# Patient Record
Sex: Male | Born: 1955
Health system: Southern US, Community
[De-identification: ages and names within clinical notes are randomized; demographics above are authoritative.]

## PROBLEM LIST (undated history)

## (undated) DIAGNOSIS — I10 Essential (primary) hypertension: Secondary | ICD-10-CM

## (undated) DIAGNOSIS — G4733 Obstructive sleep apnea (adult) (pediatric): Secondary | ICD-10-CM

## (undated) DIAGNOSIS — Z954 Presence of other heart-valve replacement: Secondary | ICD-10-CM

## (undated) DIAGNOSIS — E785 Hyperlipidemia, unspecified: Secondary | ICD-10-CM

## (undated) DIAGNOSIS — K5732 Diverticulitis of large intestine without perforation or abscess without bleeding: Secondary | ICD-10-CM

## (undated) DIAGNOSIS — Z8679 Personal history of other diseases of the circulatory system: Secondary | ICD-10-CM

## (undated) DIAGNOSIS — E119 Type 2 diabetes mellitus without complications: Secondary | ICD-10-CM

## (undated) DIAGNOSIS — H9192 Unspecified hearing loss, left ear: Secondary | ICD-10-CM

## (undated) DIAGNOSIS — Z9989 Dependence on other enabling machines and devices: Secondary | ICD-10-CM

## (undated) DIAGNOSIS — Z9889 Other specified postprocedural states: Secondary | ICD-10-CM

## (undated) DIAGNOSIS — C641 Malignant neoplasm of right kidney, except renal pelvis: Secondary | ICD-10-CM

## (undated) DIAGNOSIS — I4819 Other persistent atrial fibrillation: Secondary | ICD-10-CM

## (undated) DIAGNOSIS — N182 Chronic kidney disease, stage 2 (mild): Secondary | ICD-10-CM

## (undated) DIAGNOSIS — I099 Rheumatic heart disease, unspecified: Secondary | ICD-10-CM

## (undated) DIAGNOSIS — I639 Cerebral infarction, unspecified: Secondary | ICD-10-CM

## (undated) DIAGNOSIS — E291 Testicular hypofunction: Secondary | ICD-10-CM

## (undated) DIAGNOSIS — F419 Anxiety disorder, unspecified: Secondary | ICD-10-CM

## (undated) DIAGNOSIS — F32A Depression, unspecified: Secondary | ICD-10-CM

## (undated) DIAGNOSIS — R7303 Prediabetes: Secondary | ICD-10-CM

## (undated) DIAGNOSIS — I050 Rheumatic mitral stenosis: Secondary | ICD-10-CM

## (undated) DIAGNOSIS — E669 Obesity, unspecified: Secondary | ICD-10-CM

## (undated) DIAGNOSIS — R911 Solitary pulmonary nodule: Secondary | ICD-10-CM

## (undated) DIAGNOSIS — E039 Hypothyroidism, unspecified: Secondary | ICD-10-CM

## (undated) DIAGNOSIS — K469 Unspecified abdominal hernia without obstruction or gangrene: Secondary | ICD-10-CM

## (undated) DIAGNOSIS — N2889 Other specified disorders of kidney and ureter: Secondary | ICD-10-CM

## (undated) DIAGNOSIS — I2510 Atherosclerotic heart disease of native coronary artery without angina pectoris: Secondary | ICD-10-CM

## (undated) DIAGNOSIS — I442 Atrioventricular block, complete: Secondary | ICD-10-CM

## (undated) DIAGNOSIS — F329 Major depressive disorder, single episode, unspecified: Secondary | ICD-10-CM

## (undated) HISTORY — DX: Solitary pulmonary nodule: R91.1

## (undated) HISTORY — PX: INGUINAL HERNIA REPAIR: SUR1180

## (undated) HISTORY — DX: Testicular hypofunction: E29.1

## (undated) HISTORY — DX: Unspecified abdominal hernia without obstruction or gangrene: K46.9

## (undated) HISTORY — DX: Hyperlipidemia, unspecified: E78.5

## (undated) HISTORY — DX: Malignant neoplasm of right kidney, except renal pelvis: C64.1

## (undated) HISTORY — DX: Diverticulitis of large intestine without perforation or abscess without bleeding: K57.32

## (undated) HISTORY — DX: Type 2 diabetes mellitus without complications: E11.9

## (undated) HISTORY — DX: Obesity, unspecified: E66.9

## (undated) HISTORY — DX: Atrioventricular block, complete: I44.2

## (undated) HISTORY — DX: Essential (primary) hypertension: I10

---

## 1988-03-23 HISTORY — PX: EYE SURGERY: SHX253

## 1996-03-23 DIAGNOSIS — I639 Cerebral infarction, unspecified: Secondary | ICD-10-CM

## 1996-03-23 HISTORY — DX: Cerebral infarction, unspecified: I63.9

## 2002-01-10 ENCOUNTER — Emergency Department (HOSPITAL_COMMUNITY): Admission: EM | Admit: 2002-01-10 | Discharge: 2002-01-11 | Payer: Self-pay

## 2012-12-19 ENCOUNTER — Encounter: Payer: Self-pay | Admitting: Cardiology

## 2012-12-19 ENCOUNTER — Encounter: Payer: Self-pay | Admitting: *Deleted

## 2012-12-19 DIAGNOSIS — D72829 Elevated white blood cell count, unspecified: Secondary | ICD-10-CM | POA: Insufficient documentation

## 2012-12-19 DIAGNOSIS — K469 Unspecified abdominal hernia without obstruction or gangrene: Secondary | ICD-10-CM | POA: Insufficient documentation

## 2012-12-19 DIAGNOSIS — K5732 Diverticulitis of large intestine without perforation or abscess without bleeding: Secondary | ICD-10-CM | POA: Insufficient documentation

## 2012-12-19 DIAGNOSIS — E559 Vitamin D deficiency, unspecified: Secondary | ICD-10-CM | POA: Insufficient documentation

## 2012-12-19 DIAGNOSIS — E782 Mixed hyperlipidemia: Secondary | ICD-10-CM | POA: Insufficient documentation

## 2012-12-19 DIAGNOSIS — J310 Chronic rhinitis: Secondary | ICD-10-CM | POA: Insufficient documentation

## 2012-12-19 DIAGNOSIS — E291 Testicular hypofunction: Secondary | ICD-10-CM | POA: Insufficient documentation

## 2012-12-19 DIAGNOSIS — E669 Obesity, unspecified: Secondary | ICD-10-CM | POA: Insufficient documentation

## 2012-12-19 DIAGNOSIS — IMO0001 Reserved for inherently not codable concepts without codable children: Secondary | ICD-10-CM | POA: Insufficient documentation

## 2012-12-19 DIAGNOSIS — R7309 Other abnormal glucose: Secondary | ICD-10-CM | POA: Insufficient documentation

## 2012-12-19 DIAGNOSIS — D126 Benign neoplasm of colon, unspecified: Secondary | ICD-10-CM | POA: Insufficient documentation

## 2012-12-19 DIAGNOSIS — G4730 Sleep apnea, unspecified: Secondary | ICD-10-CM | POA: Insufficient documentation

## 2012-12-19 DIAGNOSIS — I10 Essential (primary) hypertension: Secondary | ICD-10-CM | POA: Insufficient documentation

## 2012-12-20 ENCOUNTER — Ambulatory Visit (INDEPENDENT_AMBULATORY_CARE_PROVIDER_SITE_OTHER): Payer: Managed Care, Other (non HMO) | Admitting: Cardiology

## 2012-12-20 ENCOUNTER — Encounter: Payer: Self-pay | Admitting: Cardiology

## 2012-12-20 VITALS — BP 121/75 | HR 60 | Ht 68.0 in | Wt 199.0 lb

## 2012-12-20 DIAGNOSIS — I10 Essential (primary) hypertension: Secondary | ICD-10-CM

## 2012-12-20 NOTE — Progress Notes (Signed)
Patient ID: Kevin English, male   DOB: September 08, 1955, 57 y.o.   MRN: [id number]    Patient Name: Kevin English Date of Encounter: 12/20/2012  Primary Care Provider:  No primary provider on file. Primary Cardiologist:  Tobias Alexander, H   Patient Profile Fatigue, SOB  Problem List   Past Medical History  Diagnosis Date  . Other abnormal glucose   . Benign neoplasm of colon   . Hernia   . Chronic rhinitis   . Diverticulitis of colon   . Hyperlipidemia   . HTN (hypertension)   . Hypogonadism male   . Hyperlipemia   . Leukocytosis   . Myalgia and myositis, unspecified   . Obesity   . Sleep apnea   . Vitamin D deficiency    No past surgical history on file.  Allergies  No Known Allergies  HPI  57 years old male with h/o HTN, hyperlipidemia, former smoker, who is complaining of fatigue and SOB. He has had a cardiac work-up including exercise nuclear stress test that was negative.  He was sent here by his PCP for concern of fatigue. He was recently diagnosed with hypothyroidism and just started on Synthroid. The patient denies any prior chest pain. He states that he never was involved in any sports or regular physical activity. His lifestyle is very sedentary. The patient comments that although he feel tired there has been no change in the last 3 years. He denies SOB on regular exertion.  Home Medications  Prior to Admission medications   Medication Sig Start Date End Date Taking? Authorizing Provider  amLODipine (NORVASC) 10 MG tablet Take 10 mg by mouth daily.   Yes Historical Provider, MD  aspirin 325 MG tablet Take 325 mg by mouth daily.   Yes Historical Provider, MD  citalopram (CELEXA) 20 MG tablet Take 20 mg by mouth daily.   Yes Historical Provider, MD  cyclobenzaprine (FLEXERIL) 10 MG tablet Take 10 mg by mouth 3 (three) times daily as needed for muscle spasms.   Yes Historical Provider, MD  diazepam (VALIUM) 5 MG tablet Take 5 mg by mouth every 6 (six) hours as  needed for anxiety.   Yes Historical Provider, MD  Docusate Calcium (STOOL SOFTENER PO) Take by mouth as needed.   Yes Historical Provider, MD  eszopiclone (LUNESTA) 1 MG TABS tablet Take 1 mg by mouth at bedtime. 11/30/12  Yes Historical Provider, MD  levothyroxine (SYNTHROID, LEVOTHROID) 50 MCG tablet 50 mcg daily. 12/02/12  Yes Historical Provider, MD  losartan-hydrochlorothiazide (HYZAAR) 100-12.5 MG per tablet Take 1 tablet by mouth daily.   Yes Historical Provider, MD  metoprolol succinate (TOPROL-XL) 25 MG 24 hr tablet Take 25 mg by mouth daily. 10/21/12  Yes Historical Provider, MD  pravastatin (PRAVACHOL) 40 MG tablet Take 40 mg by mouth daily. 09/18/12  Yes Historical Provider, MD   Family History  Family History  Problem Relation Age of Onset  . Hypertension      Social History  History   Social History  . Marital Status: Married    Spouse Name: N/A    Number of Children: N/A  . Years of Education: N/A   Occupational History  . Not on file.   Social History Main Topics  . Smoking status: Light Tobacco Smoker    Types: Cigarettes  . Smokeless tobacco: Not on file  . Alcohol Use: Not on file  . Drug Use: Not on file  . Sexual Activity: Not on file   Other  Topics Concern  . Not on file   Social History Narrative  . No narrative on file     Review of Systems General:  No chills, fever, night sweats or weight changes.  Cardiovascular:  No chest pain, dyspnea on exertion, edema, orthopnea, palpitations, paroxysmal nocturnal dyspnea. Dermatological: No rash, lesions/masses Respiratory: No cough, dyspnea Urologic: No hematuria, dysuria Abdominal:   No nausea, vomiting, diarrhea, bright red blood per rectum, melena, or hematemesis Neurologic:  No visual changes, wkns, changes in mental status. All other systems reviewed and are otherwise negative except as noted above.  Physical Exam  Blood pressure 121/75, pulse 60, height 5\' 8"  (1.727 m), weight 199 lb (90.266  kg).  General: Pleasant, NAD Psych: Normal affect. Neuro: Alert and oriented X 3. Moves all extremities spontaneously. HEENT: Normal  Neck: Supple without bruits or JVD. Lungs:  Resp regular and unlabored, CTA. Heart: RRR no s3, s4, or murmurs. Abdomen: Soft, non-tender, non-distended, BS + x 4.  Extremities: No clubbing, cyanosis or edema. DP/PT/Radials 2+ and equal bilaterally.  Accessory Clinical Findings  ECG - SR, 58 BPM, RBBB    Assessment & Plan  57 year old male   1. Fatigue - most probably multifactorial, hypothyroidism, that was just started on replacement with Synthroid, deconditioning and possible mild depression. (the patient states that he has no desire or motivation to do any change in his life even if he realizes that he has to). He was encouraged  To exercise regularly.  2. Hypertension - controlled  3. Hyperlipidemia - on Pravastatin 40 mg PO QHS, recently checked by his PCP  4. Smoking cessation - counseling provided    Lars Masson, MD 12/20/2012, 3:46 PM

## 2012-12-20 NOTE — Patient Instructions (Addendum)
**Note De-identified [place] Obfuscation** Your physician recommends that you continue on your current medications as directed. Please refer to the Current Medication list given to you today.  Your physician wants you to follow-up in: 1 year. You will receive a reminder letter in the mail two months in advance. If you don't receive a letter, please call our office to schedule the follow-up appointment.  

## 2013-07-20 ENCOUNTER — Encounter: Payer: Self-pay | Admitting: Cardiology

## 2013-07-20 ENCOUNTER — Ambulatory Visit (HOSPITAL_COMMUNITY): Payer: Managed Care, Other (non HMO) | Attending: Cardiovascular Disease | Admitting: Radiology

## 2013-07-20 ENCOUNTER — Ambulatory Visit (INDEPENDENT_AMBULATORY_CARE_PROVIDER_SITE_OTHER): Payer: Managed Care, Other (non HMO) | Admitting: Cardiology

## 2013-07-20 VITALS — BP 128/86 | HR 80 | Ht 68.0 in | Wt 201.0 lb

## 2013-07-20 DIAGNOSIS — R000 Tachycardia, unspecified: Secondary | ICD-10-CM | POA: Insufficient documentation

## 2013-07-20 DIAGNOSIS — R0602 Shortness of breath: Secondary | ICD-10-CM

## 2013-07-20 DIAGNOSIS — R55 Syncope and collapse: Secondary | ICD-10-CM

## 2013-07-20 LAB — COMPREHENSIVE METABOLIC PANEL
ALT: 28 U/L (ref 0–53)
AST: 19 U/L (ref 0–37)
Albumin: 3.9 g/dL (ref 3.5–5.2)
Alkaline Phosphatase: 90 U/L (ref 39–117)
BUN: 14 mg/dL (ref 6–23)
CO2: 29 mEq/L (ref 19–32)
Calcium: 10.3 mg/dL (ref 8.4–10.5)
Chloride: 103 mEq/L (ref 96–112)
Creatinine, Ser: 1.1 mg/dL (ref 0.4–1.5)
GFR: 71.78 mL/min (ref >60.00)
Glucose, Bld: 99 mg/dL (ref 70–99)
Potassium: 3.5 mEq/L (ref 3.5–5.1)
Sodium: 138 mEq/L (ref 135–145)
Total Bilirubin: 0.4 mg/dL (ref 0.3–1.2)
Total Protein: 6.8 g/dL (ref 6.0–8.3)

## 2013-07-20 LAB — TSH: TSH: 0.72 u[IU]/mL (ref 0.35–5.50)

## 2013-07-20 NOTE — Progress Notes (Signed)
Echocardiogram performed.  

## 2013-07-20 NOTE — Progress Notes (Signed)
Patient ID: KAUSHAL VANNICE, male   DOB: 01/10/1956, 58 y.o.   MRN: [id number]    Patient Name: Kevin English Date of Encounter: 07/20/2013  Primary Care Provider:  No primary provider on file. Primary Cardiologist:  Dorothy Spark   Patient Profile Fatigue, SOB  Problem List   Past Medical History  Diagnosis Date  . Other abnormal glucose   . Benign neoplasm of colon   . Hernia   . Chronic rhinitis   . Diverticulitis of colon   . Hyperlipidemia   . HTN (hypertension)   . Hypogonadism male   . Hyperlipemia   . Leukocytosis   . Myalgia and myositis, unspecified   . Obesity   . Sleep apnea   . Vitamin D deficiency    No past surgical history on file.  Allergies  No Known Allergies  HPI  58 years old male with h/o HTN, hyperlipidemia, former smoker, who is complaining of fatigue and SOB. He has had a cardiac work-up in 2011 including exercise nuclear stress test that was negative an echocardiogram that showed normal ejection fraction.Marland Kitchen  He was sent here by his PCP for concern of fatigue. He was recently diagnosed with hypothyroidism and just started on Synthroid. The patient denies any prior chest pain. He states that he never was involved in any sports or regular physical activity. His lifestyle is very sedentary. The patient comments that although he feel tired there has been no change in the last 3 years. He denies SOB on regular exertion.  She is coming after 6 months and complains of profound fatigue, he is not sure it got significantly worse in the last 6 months, and he ordered stat he has been exhausted for the last 10 years and eventually forced him to retire. While he was still working he was able to go to the gym and work out but since he retired he stopped doing any physical activity. He was started on Synthroid in September for hypothyroidism and he states that originally he felt more energetic but over time he feels the same. He also states that his blood pressure  has been running low so he stopped taking Toprol-XL and amlodipine 10 mg daily and his Hyzaar that he just restarted on Tuesday. The patient also stopped taking pravastatin as he did well lower extremity pain that resolved after discontinuation. The patient has noticed that when he checks his blood pressure many times his heart rate will be 160 and other occasion 50. He denies any palpitations or syncope. His father was diagnosed with atrial fibrillation.  Home Medications  Prior to Admission medications   Medication Sig Start Date End Date Taking? Authorizing Provider  amLODipine (NORVASC) 10 MG tablet Take 10 mg by mouth daily.   Yes Historical Provider, MD  aspirin 325 MG tablet Take 325 mg by mouth daily.   Yes Historical Provider, MD  citalopram (CELEXA) 20 MG tablet Take 20 mg by mouth daily.   Yes Historical Provider, MD  cyclobenzaprine (FLEXERIL) 10 MG tablet Take 10 mg by mouth 3 (three) times daily as needed for muscle spasms.   Yes Historical Provider, MD  diazepam (VALIUM) 5 MG tablet Take 5 mg by mouth every 6 (six) hours as needed for anxiety.   Yes Historical Provider, MD  Docusate Calcium (STOOL SOFTENER PO) Take by mouth as needed.   Yes Historical Provider, MD  eszopiclone (LUNESTA) 1 MG TABS tablet Take 1 mg by mouth at bedtime. 11/30/12  Yes Historical  Provider, MD  levothyroxine (SYNTHROID, LEVOTHROID) 50 MCG tablet 50 mcg daily. 12/02/12  Yes Historical Provider, MD  losartan-hydrochlorothiazide (HYZAAR) 100-12.5 MG per tablet Take 1 tablet by mouth daily.   Yes Historical Provider, MD  metoprolol succinate (TOPROL-XL) 25 MG 24 hr tablet Take 25 mg by mouth daily. 10/21/12  Yes Historical Provider, MD  pravastatin (PRAVACHOL) 40 MG tablet Take 40 mg by mouth daily. 09/18/12  Yes Historical Provider, MD   Family History  Family History  Problem Relation Age of Onset  . Hypertension      Social History  History   Social History  . Marital Status: Married    Spouse Name:  N/A    Number of Children: N/A  . Years of Education: N/A   Occupational History  . Not on file.   Social History Main Topics  . Smoking status: Light Tobacco Smoker    Types: Cigarettes  . Smokeless tobacco: Not on file  . Alcohol Use: Not on file  . Drug Use: Not on file  . Sexual Activity: Not on file   Other Topics Concern  . Not on file   Social History Narrative  . No narrative on file     Review of Systems General:  No chills, fever, night sweats or weight changes.  Cardiovascular:  No chest pain, dyspnea on exertion, edema, orthopnea, palpitations, paroxysmal nocturnal dyspnea. Dermatological: No rash, lesions/masses Respiratory: No cough, dyspnea Urologic: No hematuria, dysuria Abdominal:   No nausea, vomiting, diarrhea, bright red blood per rectum, melena, or hematemesis Neurologic:  No visual changes, wkns, changes in mental status. All other systems reviewed and are otherwise negative except as noted above.  Physical Exam  Blood pressure 128/86, pulse 80, height 5\' 8"  (1.727 m), weight 201 lb (91.173 kg).  General: Pleasant, NAD Psych: Normal affect. Neuro: Alert and oriented X 3. Moves all extremities spontaneously. HEENT: Normal  Neck: Supple without bruits or JVD. Lungs:  Resp regular and unlabored, CTA. Heart: RRR no s3, s4, or murmurs. Abdomen: Soft, non-tender, non-distended, BS + x 4.  Extremities: No clubbing, cyanosis or edema. DP/PT/Radials 2+ and equal bilaterally.  Accessory Clinical Findings  ECG - SR, 63 beats per minute, RBBB, unchanged from EKG on 12/20/2012    Assessment & Plan  58 year old male   1. Fatigue - most probably multifactorial, hypothyroidism, that was just started on replacement with Synthroid, deconditioning and possible mild depression. (the patient states that he has no desire or motivation to do any change in his life even if he realizes that he has to). He was encouraged  To exercise regularly. No improvement in 6  months. We will recheck TSH. We'll recheck echocardiogram. And we'll start her 30 day e-cardio monitor to evaluate for possible atrial fibrillation. All labs today including TSH normal. Once all these tests are negative we will consider to proceed with testing like cortisol level and other possible causes of fatigue and low blood pressure.  2. Hypertension - controlled  3. Hyperlipidemia - on Pravastatin 40 mg PO QHS, recently checked by his PCP  4. Smoking cessation - counseling provided   Dorothy Spark, MD 07/20/2013, 2:34 PM

## 2013-07-20 NOTE — Patient Instructions (Signed)
**Note De-Identified [place] Obfuscation** Your physician has recommended that you wear an event monitor for 30 days. Event monitors are medical devices that record the heart's electrical activity. Doctors most often [place] these monitors to diagnose arrhythmias. Arrhythmias are problems with the speed or rhythm of the heartbeat. The monitor is a small, portable device. You can wear one while you do your normal daily activities. This is usually used to diagnose what is causing palpitations/syncope (passing out).  Your physician has requested that you have an echocardiogram. Echocardiography is a painless test that uses sound waves to create images of your heart. It provides your doctor with information about the size and shape of your heart and how well your heart's chambers and valves are working. This procedure takes approximately one hour. There are no restrictions for this procedure.  Your physician recommends that you return for lab work in: today  Your physician recommends that you schedule a follow-up appointment in: after tests.

## 2013-07-25 ENCOUNTER — Encounter: Payer: Self-pay | Admitting: Radiology

## 2013-07-25 ENCOUNTER — Ambulatory Visit (HOSPITAL_COMMUNITY): Payer: Managed Care, Other (non HMO)

## 2013-07-25 ENCOUNTER — Encounter (INDEPENDENT_AMBULATORY_CARE_PROVIDER_SITE_OTHER): Payer: Managed Care, Other (non HMO)

## 2013-07-25 DIAGNOSIS — R55 Syncope and collapse: Secondary | ICD-10-CM

## 2013-07-25 NOTE — Progress Notes (Signed)
Patient ID: Kevin English, male   DOB: 1955-08-29, 58 y.o.   MRN: [id number] 30 day E cardio monitor applied

## 2013-07-31 NOTE — Progress Notes (Signed)
Patient ID: Kevin English, male   DOB: 06/07/1955, 58 y.o.   MRN: [id number]  The patient was started on e-CARDIO monitor that showed sustained atrial fibrillation with a heart rate 160-180 beats per minute. This started on Saturday, 07/29/2013 at 7 AM it persisted till at least 11 PM. We don't have recordings after that. I called the patient this morning right after I obtained these recordings and the patient states that on Saturday he felt miserable, very short of breath and felt like he can't even make a few steps. Today his heart rate is normal, and he feels significantly better. He is echocardiogram from last week is showing preserved LV function but rheumatic mitral valve with moderate stenosis and moderately dilated left atrium. This is new when compared to the prior echocardiogram in 2011 where he had rheumatic mitral valve but no mitral stenosis. We will schedule the patient for an office visit with me tomorrow to check his EKG, and adjust his medication with increase beta blocker, and we will also discuss starting of anticoagulation. We also need to review his echocardiogram and consider mitral valve annuloplasty.  Callin Ashe H Nelwyn Hebdon 07/31/2013  

## 2013-08-01 ENCOUNTER — Encounter: Payer: Self-pay | Admitting: Cardiology

## 2013-08-01 ENCOUNTER — Telehealth: Payer: Self-pay | Admitting: *Deleted

## 2013-08-01 ENCOUNTER — Ambulatory Visit (INDEPENDENT_AMBULATORY_CARE_PROVIDER_SITE_OTHER): Payer: Managed Care, Other (non HMO) | Admitting: Cardiology

## 2013-08-01 VITALS — BP 108/78 | HR 88 | Ht 68.0 in | Wt 198.0 lb

## 2013-08-01 DIAGNOSIS — I050 Rheumatic mitral stenosis: Secondary | ICD-10-CM

## 2013-08-01 DIAGNOSIS — I099 Rheumatic heart disease, unspecified: Secondary | ICD-10-CM

## 2013-08-01 DIAGNOSIS — I4891 Unspecified atrial fibrillation: Secondary | ICD-10-CM

## 2013-08-01 MED ORDER — NEBIVOLOL HCL 5 MG PO TABS: 5.0000 mg | ORAL_TABLET | Freq: Every day | ORAL | Status: DC

## 2013-08-01 MED ORDER — RIVAROXABAN 20 MG PO TABS: 20.0000 mg | ORAL_TABLET | Freq: Every day | ORAL | Status: DC

## 2013-08-01 NOTE — Progress Notes (Signed)
Patient ID: Kevin English, male   DOB: May 01, 1955, 58 y.o.   MRN: [id number]   r Patient Name: Kevin English Date of Encounter: 08/01/2013  Primary Care Provider:  No primary provider on file. Primary Cardiologist:  Dorothy Spark   Patient Profile Fatigue, SOB  Problem List   Past Medical History  Diagnosis Date  . Other abnormal glucose   . Benign neoplasm of colon   . Hernia   . Chronic rhinitis   . Diverticulitis of colon   . Hyperlipidemia   . HTN (hypertension)   . Hypogonadism male   . Hyperlipemia   . Leukocytosis   . Myalgia and myositis, unspecified   . Obesity   . Sleep apnea   . Vitamin D deficiency    No past surgical history on file.  Allergies  No Known Allergies  HPI  58 years old male with h/o HTN, hyperlipidemia, former smoker, who is complaining of fatigue and SOB. He has had a cardiac work-up in 2011 including exercise nuclear stress test that was negative an echocardiogram that showed normal ejection fraction.Marland Kitchen  He was sent here by his PCP for concern of fatigue. He was recently diagnosed with hypothyroidism and just started on Synthroid. The patient denies any prior chest pain. He states that he never was involved in any sports or regular physical activity. His lifestyle is very sedentary. The patient comments that although he feel tired there has been no change in the last 3 years. He denies SOB on regular exertion.  She is coming after 6 months and complains of profound fatigue, he is not sure it got significantly worse in the last 6 months, and he ordered stat he has been exhausted for the last 10 years and eventually forced him to retire. While he was still working he was able to go to the gym and work out but since he retired he stopped doing any physical activity. He was started on Synthroid in September for hypothyroidism and he states that originally he felt more energetic but over time he feels the same. He also states that his blood  pressure has been running low so he stopped taking Toprol-XL and amlodipine 10 mg daily and his Hyzaar that he just restarted on Tuesday. The patient also stopped taking pravastatin as he did well lower extremity pain that resolved after discontinuation. The patient has noticed that when he checks his blood pressure many times his heart rate will be 160 and other occasion 50. He denies any palpitations or syncope. His father was diagnosed with atrial fibrillation.  The patient was called in for an urgent visit as we found out that he has at least 3 episodes of atrial fibrillation with rapid ventricular rate of 160-180 beats per minute in the last 3 days. Patient states that he felt absolutely exhausted and felt like she's going to pass out on Saturday when he had a 12 hour episode of paroxysmal A. fib. He felt better the day afterwards but then on Monday he had another episodes of A. fib and he was symptomatic again.  Home Medications  Prior to Admission medications   Medication Sig Start Date End Date Taking? Authorizing Provider  amLODipine (NORVASC) 10 MG tablet Take 10 mg by mouth daily.   Yes Historical Provider, MD  aspirin 325 MG tablet Take 325 mg by mouth daily.   Yes Historical Provider, MD  citalopram (CELEXA) 20 MG tablet Take 20 mg by mouth daily.   Yes Historical  Provider, MD  cyclobenzaprine (FLEXERIL) 10 MG tablet Take 10 mg by mouth 3 (three) times daily as needed for muscle spasms.   Yes Historical Provider, MD  diazepam (VALIUM) 5 MG tablet Take 5 mg by mouth every 6 (six) hours as needed for anxiety.   Yes Historical Provider, MD  Docusate Calcium (STOOL SOFTENER PO) Take by mouth as needed.   Yes Historical Provider, MD  eszopiclone (LUNESTA) 1 MG TABS tablet Take 1 mg by mouth at bedtime. 11/30/12  Yes Historical Provider, MD  levothyroxine (SYNTHROID, LEVOTHROID) 50 MCG tablet 50 mcg daily. 12/02/12  Yes Historical Provider, MD  losartan-hydrochlorothiazide (HYZAAR) 100-12.5 MG  per tablet Take 1 tablet by mouth daily.   Yes Historical Provider, MD  metoprolol succinate (TOPROL-XL) 25 MG 24 hr tablet Take 25 mg by mouth daily. 10/21/12  Yes Historical Provider, MD  pravastatin (PRAVACHOL) 40 MG tablet Take 40 mg by mouth daily. 09/18/12  Yes Historical Provider, MD   Family History  Family History  Problem Relation Age of Onset  . Hypertension      Social History  History   Social History  . Marital Status: Married    Spouse Name: N/A    Number of Children: N/A  . Years of Education: N/A   Occupational History  . Not on file.   Social History Main Topics  . Smoking status: Light Tobacco Smoker    Types: Cigarettes  . Smokeless tobacco: Not on file  . Alcohol Use: Not on file  . Drug Use: Not on file  . Sexual Activity: Not on file   Other Topics Concern  . Not on file   Social History Narrative  . No narrative on file     Review of Systems General:  No chills, fever, night sweats or weight changes.  Cardiovascular:  No chest pain, dyspnea on exertion, edema, orthopnea, palpitations, paroxysmal nocturnal dyspnea. Dermatological: No rash, lesions/masses Respiratory: No cough, dyspnea Urologic: No hematuria, dysuria Abdominal:   No nausea, vomiting, diarrhea, bright red blood per rectum, melena, or hematemesis Neurologic:  No visual changes, wkns, changes in mental status. All other systems reviewed and are otherwise negative except as noted above.  Physical Exam  Blood pressure 108/78, pulse 88, height 5\' 8"  (1.727 m), weight 198 lb (89.812 kg).  General: Pleasant, NAD Psych: Normal affect. Neuro: Alert and oriented X 3. Moves all extremities spontaneously. HEENT: Normal  Neck: Supple without bruits or JVD. Lungs:  Resp regular and unlabored, CTA. Heart: RRR no s3, s4, or murmurs. Abdomen: Soft, non-tender, non-distended, BS + x 4.  Extremities: No clubbing, cyanosis or edema. DP/PT/Radials 2+ and equal bilaterally.  Accessory  Clinical Findings  ECG - SR, 86 beats per minute, RBBB, unchanged from EKG on 12/20/2012  Echocardiogram 07/20/2013 Left ventricle: The cavity size was normal. Systolic function was normal. The estimated ejection fraction was in the range of 55% to 60%. Wall motion was normal; there were no regional wall motion abnormalities. Doppler parameters are consistent with elevated mean left atrial filling pressure. - Aortic valve: Mild regurgitation. - Mitral valve: Thickening, consistent with rheumatic disease. Mobility of the anterior leaflet was moderately restricted. The process was limited to the leaflet tips, and leaflet body motion was preserved.Diastolic leaflet doming was present. (Heart rate 63 bpm) Doppler: The findings are consistent with moderate stenosis. Valve area by pressure half-time: 1.26cm^2. Indexed valve area by pressure half-time: 0.62cm^2/m^2. Valve area by continuity equation (using LVOT flow): 0.98cm^2. Indexed valve area by continuity equation (  using LVOT flow): 0.49cm^2/m^2. Mean gradient: 83mm Hg (D). Peak gradient: 48mm Hg (D). - Left atrium: The atrium was moderately dilated.    Assessment & Plan  58 year old male   1. Rheumatic heart disease, mitral stenosis, mean gradient of 7 mmHg, peak gradient 60 mmHg and valve area by pressure half-time 1.26 cm. This is a big change when compared to the echocardiogram in 2011 when the gradients were normal. Since the patient is significantly symptomatic with daily episodes of maximal atrial fibrillation with RVR he would try to outreach to Dr. Sharmon Le[facility]den at the [facility] for consideration of possible balloon mitral valvulotomy.  2. paroxysmal atrial fibrillation with RVR - we'll start patient on bystolic 5 mg daily and start him on the Coumadin. We will also refer him to EP for further management of symptomatic atrial fibrillation however fixing his mitral valve moderate the the ultimate management 4 days at  this point.  3. Fatigue - most probably multifactorial, hypothyroidism, that was just started on replacement with Synthroid, deconditioning and possible mild depression. (the patient states that he has no desire or motivation to do any change in his life even if he realizes that he has to). He was encouraged  To exercise regularly. No improvement in 6 months. TSH normal.   4. Hypertension - controlled, we will discontinue losartan/hydrochlorothiazide as we are restarting bystolic to control his heart rate with A. fib.  5. Hyperlipidemia - on Pravastatin 40 mg PO QHS, recently checked by his PCP  6. Smoking cessation - counseling provided   Dorothy Spark, MD 08/01/2013, 10:31 AM

## 2013-08-01 NOTE — Patient Instructions (Addendum)
Your physician has recommended you make the following change in your medication:   START TAKING XARELTO 20 MG DAILY  START TAKING BYSTOLIC 5 MG DAILY   STOP TAKING ASPIRIN   STOP TAKING LOSARTAN-HCTZ 100-12.5 MG  Your physician recommends that you schedule a follow-up appointment WITH DR Thompson Grayer

## 2013-08-01 NOTE — Telephone Encounter (Signed)
Kevin English from e-cardio calls b/c pt had a 1 minute run of atrial fib heartrate 160. I checked on pt as he is currently in our office awaiting appt with Dr. [name].  E-cardio will fax [place] a copy of the rhythm report. Pt states he is doing well. No chest discomfort or shortness of breath noted. He is in pod c exam rm #3 Horton Chin RN

## 2013-08-02 DIAGNOSIS — I099 Rheumatic heart disease, unspecified: Secondary | ICD-10-CM | POA: Insufficient documentation

## 2013-08-02 DIAGNOSIS — I050 Rheumatic mitral stenosis: Secondary | ICD-10-CM | POA: Insufficient documentation

## 2013-08-03 ENCOUNTER — Other Ambulatory Visit (HOSPITAL_COMMUNITY): Payer: Managed Care, Other (non HMO)

## 2013-08-04 ENCOUNTER — Telehealth: Payer: Self-pay | Admitting: Cardiology

## 2013-08-04 NOTE — Telephone Encounter (Signed)
CD Of Echo Mailed to: [employer] [place], [place] [address] [phone]

## 2013-08-07 ENCOUNTER — Ambulatory Visit: Payer: Managed Care, Other (non HMO) | Admitting: Cardiology

## 2013-08-09 ENCOUNTER — Inpatient Hospital Stay (HOSPITAL_COMMUNITY)
Admission: EM | Admit: 2013-08-09 | Discharge: 2013-08-11 | DRG: 308 | Disposition: A | Discharge status: Home / Self Care | Payer: Managed Care, Other (non HMO) | Attending: Cardiology | Admitting: Cardiology

## 2013-08-09 ENCOUNTER — Telehealth: Payer: Self-pay | Admitting: Cardiology

## 2013-08-09 ENCOUNTER — Emergency Department (HOSPITAL_COMMUNITY): Payer: Managed Care, Other (non HMO)

## 2013-08-09 ENCOUNTER — Encounter (HOSPITAL_COMMUNITY): Payer: Self-pay | Admitting: Emergency Medicine

## 2013-08-09 DIAGNOSIS — I509 Heart failure, unspecified: Secondary | ICD-10-CM | POA: Diagnosis present

## 2013-08-09 DIAGNOSIS — E785 Hyperlipidemia, unspecified: Secondary | ICD-10-CM | POA: Diagnosis present

## 2013-08-09 DIAGNOSIS — I10 Essential (primary) hypertension: Secondary | ICD-10-CM | POA: Diagnosis present

## 2013-08-09 DIAGNOSIS — Z8249 Family history of ischemic heart disease and other diseases of the circulatory system: Secondary | ICD-10-CM

## 2013-08-09 DIAGNOSIS — I5033 Acute on chronic diastolic (congestive) heart failure: Secondary | ICD-10-CM | POA: Diagnosis present

## 2013-08-09 DIAGNOSIS — G4733 Obstructive sleep apnea (adult) (pediatric): Secondary | ICD-10-CM | POA: Diagnosis present

## 2013-08-09 DIAGNOSIS — I480 Paroxysmal atrial fibrillation: Secondary | ICD-10-CM | POA: Diagnosis present

## 2013-08-09 DIAGNOSIS — I4892 Unspecified atrial flutter: Principal | ICD-10-CM | POA: Diagnosis present

## 2013-08-09 DIAGNOSIS — I099 Rheumatic heart disease, unspecified: Secondary | ICD-10-CM | POA: Diagnosis present

## 2013-08-09 DIAGNOSIS — R0609 Other forms of dyspnea: Secondary | ICD-10-CM | POA: Diagnosis present

## 2013-08-09 DIAGNOSIS — I5032 Chronic diastolic (congestive) heart failure: Secondary | ICD-10-CM | POA: Diagnosis present

## 2013-08-09 DIAGNOSIS — Z7901 Long term (current) use of anticoagulants: Secondary | ICD-10-CM

## 2013-08-09 DIAGNOSIS — R0600 Dyspnea, unspecified: Secondary | ICD-10-CM | POA: Diagnosis present

## 2013-08-09 DIAGNOSIS — Z7982 Long term (current) use of aspirin: Secondary | ICD-10-CM

## 2013-08-09 DIAGNOSIS — R5383 Other fatigue: Secondary | ICD-10-CM

## 2013-08-09 DIAGNOSIS — E039 Hypothyroidism, unspecified: Secondary | ICD-10-CM | POA: Diagnosis present

## 2013-08-09 DIAGNOSIS — I050 Rheumatic mitral stenosis: Secondary | ICD-10-CM | POA: Diagnosis present

## 2013-08-09 DIAGNOSIS — F17200 Nicotine dependence, unspecified, uncomplicated: Secondary | ICD-10-CM | POA: Diagnosis present

## 2013-08-09 DIAGNOSIS — I4891 Unspecified atrial fibrillation: Secondary | ICD-10-CM

## 2013-08-09 DIAGNOSIS — R5381 Other malaise: Secondary | ICD-10-CM | POA: Diagnosis present

## 2013-08-09 DIAGNOSIS — E669 Obesity, unspecified: Secondary | ICD-10-CM | POA: Diagnosis present

## 2013-08-09 DIAGNOSIS — Z79899 Other long term (current) drug therapy: Secondary | ICD-10-CM

## 2013-08-09 HISTORY — DX: Rheumatic mitral stenosis: I05.0

## 2013-08-09 HISTORY — DX: Obstructive sleep apnea (adult) (pediatric): G47.33

## 2013-08-09 HISTORY — DX: Dependence on other enabling machines and devices: Z99.89

## 2013-08-09 HISTORY — DX: Hypothyroidism, unspecified: E03.9

## 2013-08-09 HISTORY — DX: Rheumatic heart disease, unspecified: I09.9

## 2013-08-09 LAB — CBC WITH DIFFERENTIAL/PLATELET
Basophils Absolute: 0 10*3/uL (ref 0.0–0.1)
Basophils Relative: 0 % (ref 0–1)
EOS PCT: 1 % (ref 0–5)
Eosinophils Absolute: 0.1 10*3/uL (ref 0.0–0.7)
HCT: 52.3 % — ABNORMAL HIGH (ref 39.0–52.0)
HEMOGLOBIN: 18.2 g/dL — AB (ref 13.0–17.0)
Lymphocytes Relative: 27 % (ref 12–46)
Lymphs Abs: 3.7 10*3/uL (ref 0.7–4.0)
MCH: 32.5 pg (ref 26.0–34.0)
MCHC: 34.8 g/dL (ref 30.0–36.0)
MCV: 93.4 fL (ref 78.0–100.0)
MONOS PCT: 14 % — AB (ref 3–12)
Monocytes Absolute: 1.9 10*3/uL — ABNORMAL HIGH (ref 0.1–1.0)
NEUTROS PCT: 58 % (ref 43–77)
Neutro Abs: 8.1 10*3/uL — ABNORMAL HIGH (ref 1.7–7.7)
Platelets: 269 10*3/uL (ref 150–400)
RBC: 5.6 MIL/uL (ref 4.22–5.81)
RDW: 14.1 % (ref 11.5–15.5)
WBC: 13.8 10*3/uL — ABNORMAL HIGH (ref 4.0–10.5)

## 2013-08-09 LAB — COMPREHENSIVE METABOLIC PANEL
ALT: 26 U/L (ref 0–53)
AST: 18 U/L (ref 0–37)
Albumin: 3.7 g/dL (ref 3.5–5.2)
Alkaline Phosphatase: 105 U/L (ref 39–117)
BUN: 13 mg/dL (ref 6–23)
CALCIUM: 10.5 mg/dL (ref 8.4–10.5)
CO2: 23 meq/L (ref 19–32)
CREATININE: 1.26 mg/dL (ref 0.50–1.35)
Chloride: 107 mEq/L (ref 96–112)
GFR calc Af Amer: 70 mL/min — ABNORMAL LOW (ref >90)
GFR, EST NON AFRICAN AMERICAN: 60 mL/min — AB (ref >90)
GLUCOSE: 113 mg/dL — AB (ref 70–99)
Potassium: 4.2 mEq/L (ref 3.7–5.3)
Sodium: 143 mEq/L (ref 137–147)
Total Bilirubin: 0.3 mg/dL (ref 0.3–1.2)
Total Protein: 7.4 g/dL (ref 6.0–8.3)

## 2013-08-09 MED ORDER — SODIUM CHLORIDE 0.9 % IV BOLUS (SEPSIS)
250.0000 mL | Freq: Once | INTRAVENOUS | Status: AC
  Administered 2013-08-09: 250 mL via INTRAVENOUS

## 2013-08-09 MED ORDER — HEPARIN (PORCINE) IN NACL 100-0.45 UNIT/ML-% IJ SOLN
1500.0000 [IU]/h | INTRAMUSCULAR | Status: DC
  Administered 2013-08-09: 1200 [IU]/h via INTRAVENOUS
  Administered 2013-08-10: 1500 [IU]/h via INTRAVENOUS
  Filled 2013-08-09 (×3): qty 250

## 2013-08-09 MED ORDER — FUROSEMIDE 10 MG/ML IJ SOLN
40.0000 mg | Freq: Once | INTRAMUSCULAR | Status: AC
  Administered 2013-08-09: 40 mg via INTRAVENOUS
  Filled 2013-08-09: qty 4

## 2013-08-09 MED ORDER — FUROSEMIDE 10 MG/ML IJ SOLN
40.0000 mg | Freq: Two times a day (BID) | INTRAMUSCULAR | Status: DC
  Administered 2013-08-09 – 2013-08-10 (×2): 40 mg via INTRAVENOUS
  Filled 2013-08-09 (×3): qty 4

## 2013-08-09 MED ORDER — SODIUM CHLORIDE 0.9 % IV SOLN
INTRAVENOUS | Status: DC
Start: 2013-08-09 — End: 2013-08-10
  Administered 2013-08-09: 14:00:00 via INTRAVENOUS

## 2013-08-09 MED ORDER — DILTIAZEM LOAD VIA INFUSION
20.0000 mg | Freq: Once | INTRAVENOUS | Status: AC
  Administered 2013-08-09: 20 mg via INTRAVENOUS
  Filled 2013-08-09: qty 20

## 2013-08-09 MED ORDER — DILTIAZEM HCL 100 MG IV SOLR
5.0000 mg/h | INTRAVENOUS | Status: DC
  Administered 2013-08-09: 10 mg/h via INTRAVENOUS
  Administered 2013-08-09 – 2013-08-11 (×2): 5 mg/h via INTRAVENOUS
  Filled 2013-08-09: qty 100

## 2013-08-09 MED ORDER — HEPARIN (PORCINE) IN NACL 100-0.45 UNIT/ML-% IJ SOLN: 15.0000 [IU]/kg/h | INTRAMUSCULAR | Status: DC

## 2013-08-09 MED ORDER — ASPIRIN EC 81 MG PO TBEC
81.0000 mg | DELAYED_RELEASE_TABLET | Freq: Every day | ORAL | Status: DC
  Administered 2013-08-09 – 2013-08-11 (×3): 81 mg via ORAL
  Filled 2013-08-09 (×4): qty 1

## 2013-08-09 NOTE — Telephone Encounter (Signed)
New Message:  Pt is c/o rapid heart rate, states he has pressure at the bottom of his throat/top of his chest, states he feels weak and was in the bed all day yesterday... Pt is requesting to speak w/ a nurse

## 2013-08-09 NOTE — ED Notes (Signed)
Patient moved to the bed.

## 2013-08-09 NOTE — ED Notes (Signed)
Pt's wife at bedside. Pt and pt's wife informed about cardizem and plan of care.

## 2013-08-09 NOTE — Progress Notes (Signed)
CARDIOLOGY CONSULT NOTE   Patient ID: Kevin English MRN: [id number], DOB/AGE: 60-06-1955   Admit date: 08/09/2013 Date of Consult: 08/09/2013  Primary Physician: Gilford Rile, MD Primary Cardiologist: Dorothy Spark  Reason for consult:  A-fib with RVR  Problem List  Past Medical History  Diagnosis Date  . Other abnormal glucose   . Benign neoplasm of colon   . Hernia   . Chronic rhinitis   . Diverticulitis of colon   . Hyperlipidemia   . HTN (hypertension)   . Hypogonadism male   . Hyperlipemia   . Leukocytosis   . Myalgia and myositis, unspecified   . Obesity   . Sleep apnea   . Vitamin D deficiency     History reviewed. No pertinent past surgical history.   Allergies  No Known Allergies  HPI  58 years old male with h/o HTN, hyperlipidemia, former smoker, who is complaining of fatigue and SOB. He has had a cardiac work-up in 2011 including exercise nuclear stress test that was negative an echocardiogram that showed normal ejection fraction.Marland Kitchen He was sent here by his PCP for concern of fatigue. He was recently diagnosed with hypothyroidism and just started on Synthroid. The patient denies any prior chest pain. He states that he never was involved in any sports or regular physical activity. His lifestyle is very sedentary. The patient comments that although he feel tired there has been no change in the last 3 years. He denies SOB on regular exertion.  He came to outpatiet clinic the last week after 6 months and complains of profound fatigue, he is not sure if it got significantly worse in the last 6 months, and he ordered stat he has been exhausted for the last 10 years and eventually forced him to retire. While he was still working he was able to go to the gym and work out but since he retired he stopped doing any physical activity. He was started on Synthroid in September for hypothyroidism and he states that originally he felt more energetic but over time he feels the  same. He also states that his blood pressure has been running low so he stopped taking Toprol-XL and amlodipine 10 mg daily and his Hyzaar that he just restarted on Tuesday. The patient also stopped taking pravastatin as he did well lower extremity pain that resolved after discontinuation. The patient has noticed that when he checks his blood pressure many times his heart rate will be 160 and other occasion 50. He denies any palpitations or syncope. His father was diagnosed with atrial fibrillation.  The patient was started on e-CARDIO monitor that showed sustained atrial fibrillation with a heart rate 160-180 beats per minute. This started on Saturday, 07/29/2013 at 7 AM it persisted till at least 11 PM. We don't have recordings after that. I called the patient this morning right after I obtained these recordings and the patient states that on Saturday he felt miserable, very short of breath and felt like he can't even make a few steps. Today his heart rate is normal, and he feels significantly better.  He is echocardiogram from last week is showing preserved LV function but rheumatic mitral valve with moderate stenosis and moderately dilated left atrium. This is new when compared to the prior echocardiogram in 2011 where he had rheumatic mitral valve but no mitral stenosis. Dr Derinda Sis at [facility] was contacted and echo was sent for review for consideration of mitral annuloplasty.   Today he is coming with  worsening SOB and palpitations and is found to be in atrial flutter with 2:1 block, then went to a-fib with ventricular rate 160-180 BPM. Started on cardizem drip with now controlled HR. Limiting factor is hypotension.  Home Medications Prior to Admission medications   Medication  Sig  Start Date  End Date  Taking?  Authorizing Provider   amLODipine (NORVASC) 10 MG tablet  Take 10 mg by mouth daily.    Yes  Historical Provider, MD   aspirin 325 MG tablet  Take 325 mg by mouth daily.    Yes  Historical  Provider, MD   citalopram (CELEXA) 20 MG tablet  Take 20 mg by mouth daily.    Yes  Historical Provider, MD   cyclobenzaprine (FLEXERIL) 10 MG tablet  Take 10 mg by mouth 3 (three) times daily as needed for muscle spasms.    Yes  Historical Provider, MD   diazepam (VALIUM) 5 MG tablet  Take 5 mg by mouth every 6 (six) hours as needed for anxiety.    Yes  Historical Provider, MD   Docusate Calcium (STOOL SOFTENER PO)  Take by mouth as needed.    Yes  Historical Provider, MD   eszopiclone (LUNESTA) 1 MG TABS tablet  Take 1 mg by mouth at bedtime.  11/30/12   Yes  Historical Provider, MD   levothyroxine (SYNTHROID, LEVOTHROID) 50 MCG tablet  50 mcg daily.  12/02/12   Yes  Historical Provider, MD   losartan-hydrochlorothiazide (HYZAAR) 100-12.5 MG per tablet  Take 1 tablet by mouth daily.    Yes  Historical Provider, MD   Bystolic Take 5 mg by mouth daily.  10/21/12   Yes  Historical Provider, MD   pravastatin (PRAVACHOL) 40 MG tablet  Take 40 mg by mouth daily.  09/18/12   Yes  Historical Provider, MD        Family History Family History  Problem Relation Age of Onset  . Hypertension       Social History History   Social History  . Marital Status: Married    Spouse Name: N/A    Number of Children: N/A  . Years of Education: N/A   Occupational History  . Not on file.   Social History Main Topics  . Smoking status: Light Tobacco Smoker    Types: Cigarettes  . Smokeless tobacco: Not on file  . Alcohol Use: Not on file  . Drug Use: Not on file  . Sexual Activity: Not on file   Other Topics Concern  . Not on file   Social History Narrative  . No narrative on file     Review of Systems  General:  No chills, fever, night sweats or weight changes.  Cardiovascular:  No chest pain, dyspnea on exertion, edema, orthopnea, palpitations, paroxysmal nocturnal dyspnea. Dermatological: No rash, lesions/masses Respiratory: No cough, dyspnea Urologic: No hematuria, dysuria Abdominal:   No  nausea, vomiting, diarrhea, bright red blood per rectum, melena, or hematemesis Neurologic:  No visual changes, wkns, changes in mental status. All other systems reviewed and are otherwise negative except as noted above.  Physical Exam  Blood pressure 96/51, pulse 74, temperature 97.5 F (36.4 C), temperature source Oral, resp. rate 25, SpO2 97.00%.  General: Pleasant, NAD Psych: Normal affect. Neuro: Alert and oriented X 3. Moves all extremities spontaneously. HEENT: Normal  Neck: Supple without bruits or holodiastolic murmur. Lungs:  Resp regular and unlabored, crackles B/L. Heart: RRR no s3, s4, or murmurs. Abdomen: Soft, non-tender, non-distended, BS +  x 4.  Extremities: No clubbing, cyanosis or edema. DP/PT/Radials 2+ and equal bilaterally.  Labs  No results found for this basename: CKTOTAL, CKMB, TROPONINI,  in the last 72 hours Lab Results  Component Value Date   WBC 13.8* 08/09/2013   HGB 18.2* 08/09/2013   HCT 52.3* 08/09/2013   MCV 93.4 08/09/2013   PLT 269 08/09/2013    Recent Labs Lab 08/09/13 1300  NA 143  K 4.2  CL 107  CO2 23  BUN 13  CREATININE 1.26  CALCIUM 10.5  PROT 7.4  BILITOT 0.3  ALKPHOS 105  ALT 26  AST 18  GLUCOSE 113*   Radiology/Studies  Dg Chest Port 1 View  08/09/2013   CLINICAL DATA:  TACHYCARDIA  EXAM: PORTABLE CHEST - 1 VIEW  COMPARISON:  DG CHEST 2V dated 05/20/2009  FINDINGS: Mild enlargement of the cardiac silhouette. Engorgement of the central pulmonary vascularity. Increased interstitial markings consistent with interstitial edema. No pleural effusion or alveolar infiltrate. No acute bony thoracic abnormality.  IMPRESSION: Congestive heart failure with mild pulmonary interstitial edema.   Electronically Signed   By: David  Martinique   On: 08/09/2013 13:32   Echocardiogram 07/20/2013  Left ventricle: The cavity size was normal. Systolic function was normal. The estimated ejection fraction was in the range of 55% to 60%. Wall motion was  normal; there were no regional wall motion abnormalities. Doppler parameters are consistent with elevated mean left atrial filling pressure. - Aortic valve: Mild regurgitation. - Mitral valve: Thickening, consistent with rheumatic disease. Mobility of the anterior leaflet was moderately restricted. The process was limited to the leaflet tips, and leaflet body motion was preserved.Diastolic leaflet doming was present. (Heart rate 63 bpm) Doppler: The findings are consistent with moderate stenosis. Valve area by pressure half-time: 1.26cm^2. Indexed valve area by pressure half-time: 0.62cm^2/m^2. Valve area by continuity equation (using LVOT flow): 0.98cm^2. Indexed valve area by continuity equation (using LVOT flow): 0.49cm^2/m^2. Mean gradient: 29mm Hg (D). Peak gradient: 36mm Hg (D). - Left atrium: The atrium was moderately dilated.  ECG: A-fib with RVR, possible 2: 1 flutter on admission, RBBB     ASSESSMENT AND PLAN  58 year old male   1. Rheumatic heart disease, mitral stenosis, mean gradient of 7 mmHg, peak gradient 60 mmHg and valve area by pressure half-time 1.26 cm. This is a big change when compared to the echocardiogram in 2011 when the gradients were normal. Since the patient is significantly symptomatic with daily episodes of  atrial fibrillation with RVR and a-flutter we would search for mitral annuloplasty vs MVR. Dr Sharmon Leyden at the [facility] was contacted for consideration of possible balloon mitral valvulotomy.  For now we will continue Cardizem drip for rate control.  2. Paroxysmal atrial fibrillation with RVR - cardizem drip, heparin drip  3. Acute on chronic CHF - valvular in etiology, we will start diuretic as he is volume overloaded, CHF on CXR.   4. Hypertension - controlled, we will discontinue losartan/hydrochlorothiazide as we are restarting bystolic to control his heart rate with A. Fib.   5. Hyperlipidemia - on Pravastatin 40 mg PO QHS,  recently checked by his PCP   6. Smoking cessation - counseling provided    Signed, Dorothy Spark, MD, [facility] 08/09/2013, 5:48 PM

## 2013-08-09 NOTE — ED Notes (Signed)
Pt is alert and oriented. MD at bedside. Pt states he has some burning in his mid upper chest but no SOB and no nausea.

## 2013-08-09 NOTE — ED Notes (Signed)
Called bed control, no changes with placement at this time.

## 2013-08-09 NOTE — ED Notes (Signed)
Pt taken off of 02 per pt's request, pt's 02 sats reading 98% on RA.

## 2013-08-09 NOTE — ED Provider Notes (Addendum)
CSN: [id number]     Arrival date & time 08/09/13  1225 History   First MD Initiated Contact with Patient 08/09/13 1250     Chief Complaint  Patient presents with  . Tachycardia     (Consider location/radiation/quality/duration/timing/severity/associated sxs/prior Treatment) The history is provided by the patient.   58 year old gentleman being evaluated by cardiology for atrial fib with a normally brief periods of rapid heart rate. Patient is being monitored. Patient was notified that his heart rate was elevated today and told to come in. Patient unable to tell what is going fast. Patient arrived here with a heart rate of 152. Initial EKG suggests of SVT in the past as been atrial fib. Patient is on several. Cardiology was considering ablation. Patient has some discomfort at the base of his neck that occurs when the heart rate as fast as 1 week and can't tell that it's been there but doesn't happen right away. Patient without any other complaints room air sat is 98%. No substernal chest pain. No shortness of breath.  Past Medical History  Diagnosis Date  . Other abnormal glucose   . Benign neoplasm of colon   . Hernia   . Chronic rhinitis   . Diverticulitis of colon   . Hyperlipidemia   . HTN (hypertension)   . Hypogonadism male   . Hyperlipemia   . Leukocytosis   . Myalgia and myositis, unspecified   . Obesity   . Sleep apnea   . Vitamin D deficiency    History reviewed. No pertinent past surgical history. Family History  Problem Relation Age of Onset  . Hypertension     History  Substance Use Topics  . Smoking status: Light Tobacco Smoker    Types: Cigarettes  . Smokeless tobacco: Not on file  . Alcohol Use: Not on file    Review of Systems  Constitutional: Negative for fever.  HENT: Negative for congestion.   Eyes: Negative for visual disturbance.  Respiratory: Negative for shortness of breath.   Cardiovascular: Positive for palpitations. Negative for chest pain.   Gastrointestinal: Negative for nausea, vomiting and abdominal pain.  Genitourinary: Negative for dysuria.  Musculoskeletal: Negative for back pain.  Skin: Negative for rash.  Neurological: Negative for headaches.  Hematological: Does not bruise/bleed easily.  Psychiatric/Behavioral: Negative for confusion.      Allergies  Review of patient's allergies indicates no known allergies.  Home Medications   Prior to Admission medications   Medication Sig Start Date End Date Taking? Authorizing Provider  levothyroxine (SYNTHROID, LEVOTHROID) 50 MCG tablet 50 mcg daily. 12/02/12   Historical Provider, MD  nebivolol (BYSTOLIC) 5 MG tablet Take 1 tablet (5 mg total) by mouth daily. 08/01/13   Dorothy Spark, MD  rivaroxaban (XARELTO) 20 MG TABS tablet Take 1 tablet (20 mg total) by mouth daily with supper. 08/01/13   Dorothy Spark, MD   BP 112/84  Pulse 139  Temp(Src) 97.5 F (36.4 C) (Oral)  Resp 30  SpO2 98% Physical Exam  Nursing note and vitals reviewed. Constitutional: He is oriented to person, place, and time. He appears well-developed and well-nourished. No distress.  HENT:  Head: Normocephalic and atraumatic.  Mouth/Throat: Oropharynx is clear and moist.  Eyes: Conjunctivae and EOM are normal. Pupils are equal, round, and reactive to light.  Neck: Normal range of motion. Neck supple.  Cardiovascular:  Murmur heard. Irregular rapid ventricular rate.  Pulmonary/Chest: Effort normal and breath sounds normal. No respiratory distress.  Abdominal: Soft. Bowel sounds are  normal. There is no tenderness.  Musculoskeletal: Normal range of motion. He exhibits no edema.  Neurological: He is alert and oriented to person, place, and time. No cranial nerve deficit. He exhibits normal muscle tone. Coordination normal.  Skin: Skin is warm. No rash noted.    ED Course  Procedures (including critical care time) Labs Review Labs Reviewed  COMPREHENSIVE METABOLIC PANEL - Abnormal;  Notable for the following:    Glucose, Bld 113 (*)    GFR calc non Af Amer 60 (*)    GFR calc Af Amer 70 (*)    All other components within normal limits  CBC WITH DIFFERENTIAL - Abnormal; Notable for the following:    WBC 13.8 (*)    Hemoglobin 18.2 (*)    HCT 52.3 (*)    Monocytes Relative 14 (*)    Neutro Abs 8.1 (*)    Monocytes Absolute 1.9 (*)    All other components within normal limits   Results for orders placed during the hospital encounter of 08/09/13  COMPREHENSIVE METABOLIC PANEL      Result Value Ref Range   Sodium 143  137 - 147 mEq/L   Potassium 4.2  3.7 - 5.3 mEq/L   Chloride 107  96 - 112 mEq/L   CO2 23  19 - 32 mEq/L   Glucose, Bld 113 (*) 70 - 99 mg/dL   BUN 13  6 - 23 mg/dL   Creatinine, Ser 1.26  0.50 - 1.35 mg/dL   Calcium 10.5  8.4 - 10.5 mg/dL   Total Protein 7.4  6.0 - 8.3 g/dL   Albumin 3.7  3.5 - 5.2 g/dL   AST 18  0 - 37 U/L   ALT 26  0 - 53 U/L   Alkaline Phosphatase 105  39 - 117 U/L   Total Bilirubin 0.3  0.3 - 1.2 mg/dL   GFR calc non Af Amer 60 (*) >90 mL/min   GFR calc Af Amer 70 (*) >90 mL/min  CBC WITH DIFFERENTIAL      Result Value Ref Range   WBC 13.8 (*) 4.0 - 10.5 K/uL   RBC 5.60  4.22 - 5.81 MIL/uL   Hemoglobin 18.2 (*) 13.0 - 17.0 g/dL   HCT 52.3 (*) 39.0 - 52.0 %   MCV 93.4  78.0 - 100.0 fL   MCH 32.5  26.0 - 34.0 pg   MCHC 34.8  30.0 - 36.0 g/dL   RDW 14.1  11.5 - 15.5 %   Platelets 269  150 - 400 K/uL   Neutrophils Relative % 58  43 - 77 %   Lymphocytes Relative 27  12 - 46 %   Monocytes Relative 14 (*) 3 - 12 %   Eosinophils Relative 1  0 - 5 %   Basophils Relative 0  0 - 1 %   Neutro Abs 8.1 (*) 1.7 - 7.7 K/uL   Lymphs Abs 3.7  0.7 - 4.0 K/uL   Monocytes Absolute 1.9 (*) 0.1 - 1.0 K/uL   Eosinophils Absolute 0.1  0.0 - 0.7 K/uL   Basophils Absolute 0.0  0.0 - 0.1 K/uL    Imaging Review Dg Chest Port 1 View  08/09/2013   CLINICAL DATA:  TACHYCARDIA  EXAM: PORTABLE CHEST - 1 VIEW  COMPARISON:  DG CHEST 2V dated  05/20/2009  FINDINGS: Mild enlargement of the cardiac silhouette. Engorgement of the central pulmonary vascularity. Increased interstitial markings consistent with interstitial edema. No pleural effusion or alveolar infiltrate. No acute  bony thoracic abnormality.  IMPRESSION: Congestive heart failure with mild pulmonary interstitial edema.   Electronically Signed   By: David  Martinique   On: 08/09/2013 13:32     EKG Interpretation   Date/Time:  Wednesday Aug 09 2013 12:32:20 EDT Ventricular Rate:  152 PR Interval:    QRS Duration: 120 QT Interval:  318 QTC Calculation: 505 R Axis:   94 Text Interpretation:  Wide QRS tachycardia Right bundle branch block  Abnormal ECG No previous ECGs available Confirmed by Dynasia Kercheval  MD, Keshonna Valvo  [phone]) on 08/09/2013 1:03:43 PM       CRITICAL CARE Performed by: Mervin Kung Total critical care time: 30 Critical care time was exclusive of separately billable procedures and treating other patients. Critical care was necessary to treat or prevent imminent or life-threatening deterioration. Critical care was time spent personally by me on the following activities: development of treatment plan with patient and/or surrogate as well as nursing, discussions with consultants, evaluation of patient's response to treatment, examination of patient, obtaining history from patient or surrogate, ordering and performing treatments and interventions, ordering and review of laboratory studies, ordering and review of radiographic studies, pulse oximetry and re-evaluation of patient's condition.     MDM   Final diagnoses:  Atrial fibrillation with RVR    Patient being evaluated by [facility] cardiology for rapid heart rates. Past rhythms have been consistent with atrial fibrillation. Patient is monitored at home. He is not able to tell what his heart rate is going fast. Some discomfort at the base of his neck when it happens. He's been having problems with fast heart rate  for the past few weeks. EKG here look a little bit lichen SVT. Only gave her Cartia Zantac he slowed down and seemed to be more consistent I think with atrial fib. Patients on several toe. Consult provided to cardiology.  Patient started on the cardia is a.m. and 20 mg and then a drip which were titrating. Initially help bring down his heart rate but now back up to the 140s. Chest x-ray shows some mild pulmonary edema we'll provide some Lasix. Cardiology will most likely need to admit. Patient without any significant symptoms.  Information about the rate when blocked being  more consistent atrial fib.came from the nurses. I was not able to observe the monitor at that time was that time was busy with the intubations.    Mervin Kung, MD 08/09/13 [place], MD 08/09/13 [phone]

## 2013-08-09 NOTE — ED Notes (Signed)
Discussed plan of care with Dr. [name] (cardiologist).  Patient is to receive heparin. Will monitor for order.

## 2013-08-09 NOTE — ED Notes (Signed)
Secretary to page Cardiology

## 2013-08-09 NOTE — Progress Notes (Signed)
ANTICOAGULATION CONSULT NOTE - Initial Consult  Pharmacy Consult for heparin Indication: atrial fibrillation  No Known Allergies  Patient Measurements:   Heparin Dosing Weight: 87kg  Vital Signs: Temp: 97.5 F (36.4 C) (05/20 1232) Temp src: Oral (05/20 1232) BP: 89/70 mmHg (05/20 2011) Pulse Rate: 72 (05/20 2011)  Labs:  Recent Labs  08/09/13 1300  HGB 18.2*  HCT 52.3*  PLT 269  CREATININE 1.26    The CrCl is unknown because both a height and weight (above a minimum accepted value) are required for this calculation.   Medical History: Past Medical History  Diagnosis Date  . Other abnormal glucose   . Benign neoplasm of colon   . Hernia   . Chronic rhinitis   . Diverticulitis of colon   . Hyperlipidemia   . HTN (hypertension)   . Hypogonadism male   . Hyperlipemia   . Leukocytosis   . Myalgia and myositis, unspecified   . Obesity   . Sleep apnea   . Vitamin D deficiency     Medications:  Infusions:  . sodium chloride 100 mL/hr at 08/09/13 1330  . diltiazem (CARDIZEM) infusion 10 mg/hr (08/09/13 1848)  . heparin      Assessment: 63 yom presented to the ED with afib w/ RVR. He is on chronic xarelto for afib. Patient states he took his last dose yesterday (5/19) at ~1700 so it has been >24 hours since his last dose. Baseline H/H is elevated and plts are WNL. No bleeding noted.   Goal of Therapy:  Heparin level 0.3-0.7 units/ml aPTT 66-102 seconds Monitor platelets by anticoagulation protocol: Yes   Plan:  1. Heparin gtt 1200 units/hr - no bolus 2. Check an 8 hour heparin level and aPTT 3. Daily heparin level, aPTT and CBC 4. F/u transition back to oral anticoagulation  Rande Lawman Kodie Pick 08/09/2013,8:18 PM

## 2013-08-09 NOTE — ED Notes (Signed)
Cards at bedside

## 2013-08-09 NOTE — ED Notes (Signed)
Per pt he has been having problems with his heart for the past few weeks. Pt currently wearing a heart monitor. sts HR has been elevated. Pt told a fib. Pt in SVT at triage with HR 160. sts pressure in chest, weakness.

## 2013-08-09 NOTE — ED Notes (Signed)
Pts HR jumped back up to 140's sustained. Notified MD. No orders to change rate of cardizem drip. Pt states he feels fluttering in his chest and some tightness. Pt is alert and oriented with wife at bedside

## 2013-08-09 NOTE — ED Notes (Signed)
Communication with [place]. MD to be consulted about admit orders for bed placement.

## 2013-08-09 NOTE — ED Notes (Signed)
Explained to patient and family that there are currently no beds available for his level of care at this time.  Patient understands and has no concerns at this time.

## 2013-08-09 NOTE — Telephone Encounter (Signed)
I spoke with the pt and he sounds weak on the phone.  The pt said he developed a rapid pulse around 9:30 this morning.  The pt is complaining of a pressure in the bottom of his throat and fatigue.  The pt has not been able to get his vital signs on his monitor because it keeps reading an error message.  A CNA tried to count the pt's pulse but it is so rapid that she could not do this. I advised the pt that he needs evaluation and treatment in the ER.  I also spoke with the pt's wife and they live 35 miles from the hospital. I recommended that she have EMS transport the pt to the hospital.  She agreed with plan.

## 2013-08-09 NOTE — ED Notes (Signed)
Patient prefers to sit in chair instead of bed.

## 2013-08-10 ENCOUNTER — Encounter (HOSPITAL_COMMUNITY): Payer: Self-pay | Admitting: Emergency Medicine

## 2013-08-10 LAB — CBC
HCT: 48.5 % (ref 39.0–52.0)
Hemoglobin: 16.2 g/dL (ref 13.0–17.0)
MCH: 31.6 pg (ref 26.0–34.0)
MCHC: 33.4 g/dL (ref 30.0–36.0)
MCV: 94.7 fL (ref 78.0–100.0)
Platelets: 241 10*3/uL (ref 150–400)
RBC: 5.12 MIL/uL (ref 4.22–5.81)
RDW: 14.2 % (ref 11.5–15.5)
WBC: 13.1 10*3/uL — ABNORMAL HIGH (ref 4.0–10.5)

## 2013-08-10 LAB — BASIC METABOLIC PANEL
BUN: 14 mg/dL (ref 6–23)
CO2: 22 mEq/L (ref 19–32)
Calcium: 9 mg/dL (ref 8.4–10.5)
Chloride: 107 mEq/L (ref 96–112)
Creatinine, Ser: 1.21 mg/dL (ref 0.50–1.35)
GFR calc Af Amer: 73 mL/min — ABNORMAL LOW (ref >90)
GFR calc non Af Amer: 63 mL/min — ABNORMAL LOW (ref >90)
Glucose, Bld: 153 mg/dL — ABNORMAL HIGH (ref 70–99)
Potassium: 3.8 mEq/L (ref 3.7–5.3)
Sodium: 143 mEq/L (ref 137–147)

## 2013-08-10 LAB — PRO B NATRIURETIC PEPTIDE: Pro B Natriuretic peptide (BNP): 1467 pg/mL — ABNORMAL HIGH (ref 0–125)

## 2013-08-10 LAB — APTT
aPTT: 45 seconds — ABNORMAL HIGH (ref 24–37)
aPTT: 56 seconds — ABNORMAL HIGH (ref 24–37)
aPTT: 72 seconds — ABNORMAL HIGH (ref 24–37)

## 2013-08-10 LAB — HEPARIN LEVEL (UNFRACTIONATED)
Heparin Unfractionated: 0.34 IU/mL (ref 0.30–0.70)
Heparin Unfractionated: 0.36 IU/mL (ref 0.30–0.70)
Heparin Unfractionated: 0.4 IU/mL (ref 0.30–0.70)

## 2013-08-10 LAB — MRSA PCR SCREENING: MRSA by PCR: NEGATIVE

## 2013-08-10 MED ORDER — LEVOTHYROXINE SODIUM 50 MCG PO TABS
50.0000 ug | ORAL_TABLET | Freq: Every day | ORAL | Status: DC
  Administered 2013-08-11: 50 ug via ORAL
  Filled 2013-08-10 (×3): qty 1

## 2013-08-10 MED ORDER — SODIUM CHLORIDE 0.9 % IV SOLN
INTRAVENOUS | Status: DC
  Administered 2013-08-10: 20:00:00 via INTRAVENOUS

## 2013-08-10 NOTE — Progress Notes (Signed)
ANTICOAGULATION CONSULT NOTE  Pharmacy Consult for heparin Indication: atrial fibrillation  No Known Allergies  Patient Measurements: Height: 5' 8.11" (173 cm) Weight: 198 lb 6.6 oz (90 kg) IBW/kg (Calculated) : 68.65 Heparin Dosing Weight: 87kg  Vital Signs: BP: 103/59 mmHg (05/21 0530) Pulse Rate: 82 (05/21 0530)  Labs:  Recent Labs  08/09/13 1300 08/10/13 0450  HGB 18.2* 16.2  HCT 52.3* 48.5  PLT 269 241  APTT  --  45*  HEPARINUNFRC  --  0.34  CREATININE 1.26 1.21    Estimated Creatinine Clearance: 70.9 ml/min (by C-G formula based on Cr of 1.21).  Assessment: 58 yo male with Afib, Xarelto on hold, for heparin  Goal of Therapy:  Heparin level 0.3-0.7 units/ml aPTT 66-102 seconds Monitor platelets by anticoagulation protocol: Yes   Plan:  Increase Heparin 1350 units/hr Check aPTT/heparin level in 6 hours.   Kevin English 08/10/2013,6:11 AM

## 2013-08-10 NOTE — Progress Notes (Signed)
TCTS BRIEF PROGRESS NOTE   Patient seen and examined.  Attempted to look at most recent ECHO, but viewing system is down.  I suspect that TEE should be done to better ascertain the feasibility of balloon mitral valvuloplasty.  If anatomically unfavorable it might be reasonable to consider surgical repair/replacement + maze.  Full note to follow.  Kevin English 08/10/2013 2:52 PM

## 2013-08-10 NOTE — Care Management Note (Signed)
    Page 1 of 1   08/10/2013     3:00:13 PM CARE MANAGEMENT NOTE 08/10/2013  Patient:  Kevin English, Kevin English   Account Number:  [id number]  Date Initiated:  08/10/2013  Documentation initiated by:  Elissa Hefty  Subjective/Objective Assessment:   adm w at fib     Action/Plan:   lives w wife, pcp dr Gilford Rile   Anticipated DC Date:     Anticipated DC Plan:           Choice offered to / List presented to:             Status of service:   Medicare Important Message given?   (If response is "NO", the following Medicare IM given date fields will be blank) Date Medicare IM given:   Date Additional Medicare IM given:    Discharge Disposition:    Per UR Regulation:  Reviewed for med. necessity/level of care/duration of stay  If discussed at [place] of Stay Meetings, dates discussed:    Comments:

## 2013-08-10 NOTE — ED Notes (Signed)
Patient unable to sleep, so he decided to move to the chair.

## 2013-08-10 NOTE — Progress Notes (Signed)
[place] for heparin Indication: atrial fibrillation  Allergies  Allergen Reactions  . Pravastatin     Myalgias     Patient Measurements: Height: 5' 8.11" (173 cm) Weight: 198 lb 6.6 oz (90 kg) IBW/kg (Calculated) : 68.65 Heparin Dosing Weight: 87kg  Vital Signs: BP: 114/62 mmHg (05/21 1330) Pulse Rate: 85 (05/21 1130)  Labs:  Recent Labs  08/09/13 1300 08/10/13 0450 08/10/13 1400  HGB 18.2* 16.2  --   HCT 52.3* 48.5  --   PLT 269 241  --   APTT  --  45* 56*  HEPARINUNFRC  --  0.34 0.36  CREATININE 1.26 1.21  --     Estimated Creatinine Clearance: 70.9 ml/min (by C-G formula based on Cr of 1.21).  Medications:  Infusions:  . diltiazem (CARDIZEM) infusion 5 mg/hr (08/10/13 0938)  . heparin 1,350 Units/hr (08/10/13 0636)    Assessment: 87 yom presented to the ED with afib w/ RVR. He is on chronic xarelto for afib. Patient states he took his last dose 5/19 at ~1700 so it has been >24 hours since his last dose. Heparin levels are therapeutic, but it appears some Xarelto is still in his system as aPTTs are low and not correlating with heparin levels. Heparin level was 0.36 units/mL and aPTT was 56 seconds this afternoon. CBC normal, no bleeding noted.   Goal of Therapy:  Heparin level 0.3-0.7 units/ml aPTT 66-102 seconds Monitor platelets by anticoagulation protocol: Yes   Plan:  1. Increase Heparin gtt to 1500 units/hr 2. Check an 8 hour heparin level and aPTT 3. Daily heparin level, aPTT and CBC 4. F/u transition back to oral anticoagulation  Jevan Gaunt D. Catalaya Garr, PharmD, BCPS Clinical Pharmacist Pager: [phone] 08/10/2013 2:41 PM

## 2013-08-10 NOTE — Progress Notes (Signed)
Dr. [name] relayed request for TEE to me. I have contacted endo. They are not sure they will be able to accomodate but are going to try to get staffing to come in early. This may be done either early or around [place] with endo asked me to go ahead and put in orders tentatively. My colleague Almyra Deforest PA-C put in the orders.  The risks and indications of transesophageal echocardiogram have been explained including risks of esophageal damage, perforation (1:10,000 risk), bleeding, pharyngeal hematoma as well as other potential complications associated with conscious sedation including aspiration, arrhythmia, respiratory failure and death. Questions were answered. Patient is willing to proceed.  Charlie Pitter, PA-C 08/10/2013 4:28 PM

## 2013-08-10 NOTE — ED Notes (Addendum)
Went to administer lasix, but patient requested to eat meal prior to administration.  Tray is already at the bedside.

## 2013-08-10 NOTE — ED Notes (Signed)
Spoke with [facility] and reported to her that Lasix was held due to pt. Being hypotensive.  Also brought to her attention that no BNP has been ordered.   Orders received to decrease the Cardizem to 5mg /hr and maintain Heart rate under 100.

## 2013-08-10 NOTE — Progress Notes (Addendum)
Call.R.N. regarding his BP 90s. Lasix not given this a.m. Yet.  Cardizem currently at 7 mg per hour, heart rate in the 80s at rest.  Will decrease the Cardizem to 5 mg per hour, allow the heart rate to run a little higher as long as it is generally below 100. Hopefully the blood pressure will come up enough to allow administration of Lasix. Continue to follow closely.

## 2013-08-10 NOTE — Progress Notes (Signed)
repaged heart care and [facility] returned call. Instructed to continue with cardizem drip with rate of 61.Catalina Gravel she will set parameters for the heart rate.

## 2013-08-10 NOTE — Progress Notes (Signed)
Paged Heart care regarding patient converting from afib to sr. Waiting call drom Lauretta Chester, PA.

## 2013-08-10 NOTE — Progress Notes (Signed)
08/10/13  Pharmacy-  Heparin  APTT 72 sec Hep level 0.4  A/P:  58yo male with AFib on Heparin 1350 units/hr.  Heparin level and aPTT are correlating as both are therapeutic.  No problems noted.  1-  Continue current rate 2-  D/C daily PTTs 3-  F/U in AM  Gracy Bruins, PharmD [facility]

## 2013-08-10 NOTE — Progress Notes (Signed)
Patient: Kevin English / Admit Date: 08/09/2013 / Date of Encounter: 08/10/2013, 11:55 AM  Subjective  Feeling somewhat better. Denies SOB, orthopnea, CP, LEE. Eager to hear disposition.  Objective   Telemetry: AF rates currently ~105  Physical Exam: Blood pressure 101/65, pulse 85, temperature 97.5 F (36.4 C), temperature source Oral, resp. rate 16, height 5' 8.11" (1.73 m), weight 198 lb 6.6 oz (90 kg), SpO2 92.00%. General: Well developed, well nourished WM in no acute distress. Head: Normocephalic, atraumatic, sclera non-icteric, no xanthomas, nares are without discharge. Neck:  JVP not elevated. Lungs: Diminished BS at bases. Rare exp wheeze. Breathing is unlabored. Heart: Elevated rate, irregular rhythm, S1 S2 without murmurs, rubs, or gallops.  Abdomen: Soft, non-tender, non-distended with normoactive bowel sounds. No rebound/guarding. Extremities: No clubbing or cyanosis. No edema. Distal pedal pulses are 2+ and equal bilaterally. Neuro: Alert and oriented X 3. Moves all extremities spontaneously. Psych:  Responds to questions appropriately with a normal affect.   Intake/Output Summary (Last 24 hours) at 08/10/13 1155 Last data filed at 08/10/13 1125  Gross per 24 hour  Intake    118 ml  Output   2210 ml  Net  -2092 ml    Inpatient Medications:  . aspirin EC  81 mg Oral Daily  . furosemide  40 mg Intravenous Q12H   Infusions:  . sodium chloride 100 mL/hr at 08/09/13 1330  . diltiazem (CARDIZEM) infusion 5 mg/hr (08/10/13 0938)  . heparin 1,350 Units/hr (08/10/13 0636)    Labs:  Recent Labs  08/09/13 1300 08/10/13 0450  NA 143 143  K 4.2 3.8  CL 107 107  CO2 23 22  GLUCOSE 113* 153*  BUN 13 14  CREATININE 1.26 1.21  CALCIUM 10.5 9.0    Recent Labs  08/09/13 1300  AST 18  ALT 26  ALKPHOS 105  BILITOT 0.3  PROT 7.4  ALBUMIN 3.7    Recent Labs  08/09/13 1300 08/10/13 0450  WBC 13.8* 13.1*  NEUTROABS 8.1*  --   HGB 18.2* 16.2  HCT 52.3*  48.5  MCV 93.4 94.7  PLT 269 241     Radiology/Studies:  Dg Chest Port 1 View  08/09/2013   CLINICAL DATA:  TACHYCARDIA  EXAM: PORTABLE CHEST - 1 VIEW  COMPARISON:  DG CHEST 2V dated 05/20/2009  FINDINGS: Mild enlargement of the cardiac silhouette. Engorgement of the central pulmonary vascularity. Increased interstitial markings consistent with interstitial edema. No pleural effusion or alveolar infiltrate. No acute bony thoracic abnormality.  IMPRESSION: Congestive heart failure with mild pulmonary interstitial edema.   Electronically Signed   By: David  Martinique   On: 08/09/2013 13:32     Assessment and Plan  1. Rheumatic heart disease, recent echo 07/20/13: mitral stenosis, mean gradient of 7 mmHg, peak gradient 60 mmHg and valve area by pressure half-time 1.26 cm. This is a big change when compared to the echocardiogram in 2011 when the gradients were normal. Since the patient is significantly symptomatic with daily episodes of atrial fibrillation with RVR and a-flutter we would search for mitral annuloplasty vs MVR. Dr Sharmon Leyden at the [facility] was contacted for consideration of possible balloon mitral valvulotomy. Dr. [name] will be letting Dr. Irish Lack know of plan. 2. Paroxysmal atrial fibrillation with RVR/PAflutter - cardizem drip, heparin drip (Xarelto has been held).  3. Acute on chronic diastolic CHF - valvular in etiology. Symptomatically improved s/p 3 doses of IV Lasix. Have turned down IVF to only necessary amount needed with  other drips. With hypotension will DC scheduled dosing and discuss with MD need for further dosing. Weight appears stable from outpatient levels. 4. Hypertension with hypotension - will need to watch BP while rate controlling. 5. Hyperlipidemia - statin recently discontinued due to MSK issues.  6. Smoking cessation - counseling provided  7. Hypothyroidism - synthroid remains under sign & held. Will release order.  Signed, Melina Copa PA-C  I have  examined the patient and reviewed assessment and plan and discussed with patient.  Agree with above as stated.  Appears euvolemic.  Stop Lasix.  Try to increase cardizem as BP tolerates. Awaiting word from [place], per Dr. [name], for possible mitral balloon valvotomy.  Jettie Booze

## 2013-08-10 NOTE — ED Notes (Signed)
Called for meal tray for breakfast.

## 2013-08-11 ENCOUNTER — Other Ambulatory Visit: Payer: Self-pay | Admitting: Cardiology

## 2013-08-11 ENCOUNTER — Other Ambulatory Visit: Payer: Self-pay

## 2013-08-11 ENCOUNTER — Encounter (HOSPITAL_COMMUNITY): Payer: Self-pay | Admitting: Thoracic Surgery (Cardiothoracic Vascular Surgery)

## 2013-08-11 DIAGNOSIS — Z7901 Long term (current) use of anticoagulants: Secondary | ICD-10-CM

## 2013-08-11 DIAGNOSIS — I050 Rheumatic mitral stenosis: Secondary | ICD-10-CM

## 2013-08-11 DIAGNOSIS — R5381 Other malaise: Secondary | ICD-10-CM | POA: Diagnosis present

## 2013-08-11 DIAGNOSIS — E039 Hypothyroidism, unspecified: Secondary | ICD-10-CM | POA: Diagnosis present

## 2013-08-11 DIAGNOSIS — R5383 Other fatigue: Secondary | ICD-10-CM | POA: Diagnosis present

## 2013-08-11 DIAGNOSIS — R0600 Dyspnea, unspecified: Secondary | ICD-10-CM | POA: Diagnosis present

## 2013-08-11 DIAGNOSIS — I480 Paroxysmal atrial fibrillation: Secondary | ICD-10-CM | POA: Diagnosis present

## 2013-08-11 DIAGNOSIS — R0609 Other forms of dyspnea: Secondary | ICD-10-CM

## 2013-08-11 LAB — CBC
HCT: 44.8 % (ref 39.0–52.0)
Hemoglobin: 15.4 g/dL (ref 13.0–17.0)
MCH: 32.5 pg (ref 26.0–34.0)
MCHC: 34.4 g/dL (ref 30.0–36.0)
MCV: 94.5 fL (ref 78.0–100.0)
Platelets: 274 10*3/uL (ref 150–400)
RBC: 4.74 MIL/uL (ref 4.22–5.81)
RDW: 14.1 % (ref 11.5–15.5)
WBC: 13.1 10*3/uL — ABNORMAL HIGH (ref 4.0–10.5)

## 2013-08-11 LAB — BASIC METABOLIC PANEL
BUN: 16 mg/dL (ref 6–23)
CO2: 25 mEq/L (ref 19–32)
Calcium: 9.8 mg/dL (ref 8.4–10.5)
Chloride: 102 mEq/L (ref 96–112)
Creatinine, Ser: 1.16 mg/dL (ref 0.50–1.35)
GFR calc Af Amer: 77 mL/min — ABNORMAL LOW (ref >90)
GFR calc non Af Amer: 67 mL/min — ABNORMAL LOW (ref >90)
Glucose, Bld: 110 mg/dL — ABNORMAL HIGH (ref 70–99)
Potassium: 4.1 mEq/L (ref 3.7–5.3)
Sodium: 139 mEq/L (ref 137–147)

## 2013-08-11 LAB — HEPARIN LEVEL (UNFRACTIONATED): Heparin Unfractionated: 0.4 IU/mL (ref 0.30–0.70)

## 2013-08-11 MED ORDER — FUROSEMIDE 40 MG PO TABS
40.0000 mg | ORAL_TABLET | Freq: Every day | ORAL | Status: DC
  Administered 2013-08-11: 40 mg via ORAL
  Filled 2013-08-11: qty 1

## 2013-08-11 MED ORDER — POTASSIUM CHLORIDE CRYS ER 20 MEQ PO TBCR: 20.0000 meq | EXTENDED_RELEASE_TABLET | Freq: Every day | ORAL | Status: DC

## 2013-08-11 MED ORDER — DILTIAZEM HCL ER COATED BEADS 120 MG PO CP24
120.0000 mg | ORAL_CAPSULE | Freq: Every day | ORAL | Status: DC
  Administered 2013-08-11: 120 mg via ORAL
  Filled 2013-08-11: qty 1

## 2013-08-11 MED ORDER — FUROSEMIDE 40 MG PO TABS: 40.0000 mg | ORAL_TABLET | Freq: Every day | ORAL | Status: DC

## 2013-08-11 MED ORDER — DILTIAZEM HCL ER COATED BEADS 120 MG PO CP24: 120.0000 mg | ORAL_CAPSULE | Freq: Every day | ORAL | Status: DC

## 2013-08-11 MED ORDER — RIVAROXABAN 20 MG PO TABS
20.0000 mg | ORAL_TABLET | Freq: Every day | ORAL | Status: DC
  Filled 2013-08-11: qty 1

## 2013-08-11 MED ORDER — POTASSIUM CHLORIDE CRYS ER 20 MEQ PO TBCR
20.0000 meq | EXTENDED_RELEASE_TABLET | Freq: Every day | ORAL | Status: DC
  Administered 2013-08-11: 20 meq via ORAL
  Filled 2013-08-11: qty 1

## 2013-08-11 NOTE — Consult Note (Signed)
[address]       [place] [address]             [phone]          CARDIOTHORACIC SURGERY CONSULTATION REPORT  PCP is GRISSO,GREG, MD Referring Provider is Kevin Dawley, MD   Reason for consultation:   mitral stenosis    HPI:  Patient is a 58 year old white male from [birth year] with recently discovered rheumatic mitral stenosis associated with recurrent paroxysmal atrial fibrillation and acute exacerbation of chronic diastolic congestive heart failure. The patient states that he first began to experience severe chronic fatigue with exertional shortness of breath and intermittent dizzy spells more than 4 years ago. He underwent an echocardiogram and was evaluated by a cardiologist in [place] at that time. He was told that there did not appear to be anything wrong. Symptoms progressed and the patient states he was forced to quit his job because of severe fatigue.  Since then he has continued to experience severe chronic fatigue. He was diagnosed with obstructive sleep apnea and uses CPAP at night. This has not substantially improved his symptoms. He was referred to Kevin English for cardiology consultation last September. His primary complaint at that time remained severe fatigue and exhaustion. He was started on Synthroid for hypothyroidism.  Symptoms persisted, and more recently he began to experience tachypalpitations associated with dizzy spells and severe fatigue.  He was seen in followup by Dr Kevin English 3 weeks ago an echocardiogram and 30 day monitor were obtained.    The patient's event monitor documented recurrent episodes of atrial fibrillation with rapid ventricular rate. Transthoracic echocardiogram revealed likely rheumatic heart disease with severe mitral stenosis and normal left ventricular systolic function.  The patient experienced another episode of persistent tachypalpitations associated with severe fatigue and shortness of breath which prompted  presentation to emergency department 2 days ago. He was found to be in atrial flutter with 2:1 block, after which time he spontaneously converted to atrial fibrillation with heart rate 160-180.  Systolic blood pressure was in the 90s at the time of presentation. He was admitted to the hospital and placed on Cardizem drip.  Yesterday afternoon the patient spontaneously converted back to sinus rhythm. The patient has been scheduled for transesophageal echocardiogram.  Cardiothoracic surgical consultation was requested.  Past Medical History  Diagnosis Date  . Other abnormal glucose   . Benign neoplasm of colon   . Hernia   . Chronic rhinitis   . Diverticulitis of colon   . Hyperlipidemia   . HTN (hypertension)   . Hypogonadism male   . Hyperlipemia   . Leukocytosis   . Myalgia and myositis, unspecified   . Obesity   . Vitamin D deficiency   . OSA on CPAP   . Hypothyroidism   . Atrial fibrillation     sustained 160-180 on e-CARDIO monitor placed 07/29/2013/notes 08/09/2013  . Rheumatic heart disease   . Acute on chronic congestive heart failure   . Mitral stenosis 08/02/2013  . paroxysmal atrial fibrillation - recurrent, with RVR on admission 08/09/13     Past Surgical History  Procedure Laterality Date  . Inguinal hernia repair Bilateral     "I had one side done twice"  . Eye surgery Right 1990    "reconstructive"    Family History  Problem Relation Age of Onset  . Hypertension      History   Social History  . Marital Status: Married    Spouse  Name: N/A    Number of Children: N/A  . Years of Education: N/A   Occupational History  . Not on file.   Social History Main Topics  . Smoking status: Current Every Day Smoker -- 1.00 packs/day for 37 years    Types: Cigarettes  . Smokeless tobacco: Never Used  . Alcohol Use: No  . Drug Use: No  . Sexual Activity: Not Currently   Other Topics Concern  . Not on file   Social History Narrative  . No narrative on file     Prior to Admission medications   Medication Sig Start Date End Date Taking? Authorizing Provider  levothyroxine (SYNTHROID, LEVOTHROID) 50 MCG tablet 50 mcg daily. 12/02/12  Yes Historical Provider, MD  nebivolol (BYSTOLIC) 5 MG tablet Take 1 tablet (5 mg total) by mouth daily. 08/01/13  Yes Kevin Spark, MD  rivaroxaban (XARELTO) 20 MG TABS tablet Take 1 tablet (20 mg total) by mouth daily with supper. 08/01/13  Yes Kevin Spark, MD  diltiazem (CARDIZEM CD) 120 MG 24 hr capsule Take 1 capsule (120 mg total) by mouth daily. 08/11/13   Kevin Quan, PA-C  furosemide (LASIX) 40 MG tablet Take 1 tablet (40 mg total) by mouth daily. 08/11/13   Kevin Quan, PA-C  potassium chloride SA (K-DUR,KLOR-CON) 20 MEQ tablet Take 1 tablet (20 mEq total) by mouth daily. 08/11/13   Kevin Quan, PA-C    Current Facility-Administered Medications  Medication Dose Route Frequency Provider Last Rate Last Dose  . 0.9 %  sodium chloride infusion   Intravenous Continuous Kevin Deforest, PA 20 mL/hr at 08/10/13 1951    . aspirin EC tablet 81 mg  81 mg Oral Daily Kevin Spark, MD   81 mg at 08/11/13 0959  . diltiazem (CARDIZEM CD) 24 hr capsule 120 mg  120 mg Oral Daily Kevin Booze, MD   120 mg at 08/11/13 0958  . furosemide (LASIX) tablet 40 mg  40 mg Oral Daily Kevin Booze, MD   40 mg at 08/11/13 0959  . levothyroxine (SYNTHROID, LEVOTHROID) tablet 50 mcg  50 mcg Oral QAC breakfast Kevin Spark, MD   50 mcg at 08/11/13 [phone]  . potassium chloride SA (K-DUR,KLOR-CON) CR tablet 20 mEq  20 mEq Oral Daily Kevin Booze, MD   20 mEq at 08/11/13 1003  . rivaroxaban (XARELTO) tablet 20 mg  20 mg Oral Q supper Kevin Booze, MD        Allergies  Allergen Reactions  . Pravastatin     Myalgias       Review of Systems:   General:  normal appetite, decreased energy, no weight gain, no weight loss, no fever  Cardiac:  no chest pain with exertion, + chest pain at rest  associated with tachypalpitations, + SOB with exertion, no resting SOB, no PND, no orthopnea, + palpitations, + arrhythmia, + atrial fibrillation, no LE edema, + dizzy spells, no syncope  Respiratory:  + shortness of breath, no home oxygen, no productive cough, + chronic dry cough, no bronchitis, no wheezing, no hemoptysis, no asthma, no pain with inspiration or cough, + sleep apnea, + CPAP at night  GI:   no difficulty swallowing, no reflux, no frequent heartburn, no hiatal hernia, no abdominal pain, + occasional constipation, no diarrhea, no hematochezia, no hematemesis, no melena  GU:   no dysuria,  no frequency, no urinary tract infection, no hematuria, no enlarged prostate, no kidney stones, no kidney  disease  Vascular:  no pain suggestive of claudication, no pain in feet, no leg cramps, no varicose veins, no DVT, no non-healing foot ulcer  Neuro:   no stroke, + TIA in the remote past, no seizures, no headaches, no temporary blindness one eye,  no slurred speech, no peripheral neuropathy, no chronic pain, no instability of gait, no memory/cognitive dysfunction  Musculoskeletal: no arthritis, no joint swelling, no myalgias, no difficulty walking, normal mobility   Skin:   no rash, no itching, no skin infections, no pressure sores or ulcerations  Psych:   no anxiety, + depression, no nervousness, no unusual recent stress  Eyes:   no blurry vision, no floaters, no recent vision changes, + wears glasses or contacts  ENT:   no hearing loss, no loose or painful teeth, no dentures, last saw dentist within the past year  Hematologic:  no easy bruising, no abnormal bleeding, no clotting disorder, no frequent epistaxis  Endocrine:  no diabetes, does not check CBG's at home     Physical Exam:   BP 139/80  Pulse 62  Temp(Src) 97.8 F (36.6 C) (Oral)  Resp 18  Ht 5\' 8"  (1.727 m)  Wt 89.676 kg (197 lb 11.2 oz)  BMI 30.07 kg/m2  SpO2 99%  General:    well-appearing  HEENT:  Unremarkable    Neck:   no JVD, no bruits, no adenopathy   Chest:   clear to auscultation, symmetrical breath sounds, no wheezes, no rhonchi   CV:   RRR, no murmur   Abdomen:  soft, non-tender, no masses   Extremities:  warm, well-perfused, pulses palpable, no lower extremity edema  Rectal/GU  Deferred  Neuro:   Grossly non-focal and symmetrical throughout  Skin:   Clean and dry, no rashes, no breakdown  Diagnostic Tests:  Transthoracic Echocardiography  Patient:    Kevin English, Kevin English MR #:       [id number] Study Date: 07/20/2013 Gender:     M Age:        58 Height:     170.2cm Weight:     90.7kg BSA:        2.86m^2 Pt. Status: Room:    ATTENDING    Croitoru, Mihai  SONOGRAPHER  [place], [facility]     Kevin English, M.D.  REFERRING    Kevin English, M.D.  PERFORMING   [facility], Outpatient cc:  ------------------------------------------------------------ LV EF: 55% -   60%  ------------------------------------------------------------ Indications:      Fatigue/malaise - chronic 780.71. Shortness of breath 786.05.  ------------------------------------------------------------ History:   PMH:  Acquired from the patient and from the patient's chart.  Fatigue and dyspnea.  Risk factors: Current tobacco use. Hypertension. Dyslipidemia.  ------------------------------------------------------------ Study Conclusions  - Left ventricle: The cavity size was normal. Systolic   function was normal. The estimated ejection fraction was   in the range of 55% to 60%. Wall motion was normal; there   were no regional wall motion abnormalities. Doppler   parameters are consistent with elevated mean left atrial   filling pressure. - Aortic valve: Mild regurgitation. - Mitral valve: Thickening, consistent with rheumatic   disease. Mobility of the anterior leaflet was moderately   restricted. The process was limited to the leaflet tips,   and leaflet body motion was preserved.Diastolic leaflet    doming was present. The findings are consistent with   moderate stenosis. Valve area by pressure half-time:   1.26cm^2. Valve area by continuity equation (using LVOT   flow): 0.98cm^2. -  Left atrium: The atrium was moderately dilated. Transthoracic echocardiography.  M-mode, complete 2D, spectral Doppler, and color Doppler.  Height:  Height: 170.2cm. Height: 67in.  Weight:  Weight: 90.7kg. Weight: 199.6lb.  Body mass index:  BMI: 31.3kg/m^2.  Body surface area:    BSA: 2.27m^2.  Blood pressure:     128/87.  Patient status:  Outpatient.  Location:   Site [facility]  ------------------------------------------------------------  ------------------------------------------------------------ Left ventricle:  The cavity size was normal. Systolic function was normal. The estimated ejection fraction was in the range of 55% to 60%. Wall motion was normal; there were no regional wall motion abnormalities. Doppler parameters are consistent with elevated mean left atrial filling pressure.  ------------------------------------------------------------ Aortic valve:   Structurally normal valve. Trileaflet. Cusp separation was normal.  Doppler:  Transvalvular velocity was within the normal range. There was no stenosis.  Mild regurgitation.  ------------------------------------------------------------ Mitral valve:   Thickening, consistent with rheumatic disease. Mobility of the anterior leaflet was moderately restricted. The process was limited to the leaflet tips, and leaflet body motion was preserved.Diastolic leaflet doming was present. (Heart rate 63 bpm)  Doppler:   The findings are consistent with moderate stenosis.      Valve area by pressure half-time: 1.26cm^2. Indexed valve area by pressure half-time: 0.62cm^2/m^2. Valve area by continuity equation (using LVOT flow): 0.98cm^2. Indexed valve area by continuity equation (using LVOT flow): 0.49cm^2/m^2.    Mean gradient: 53mm Hg (D).  Peak gradient: 80mm Hg (D).  ------------------------------------------------------------ Left atrium:  The atrium was moderately dilated.  ------------------------------------------------------------ Right ventricle:  The cavity size was normal. Wall thickness was normal. Systolic function was normal.  ------------------------------------------------------------ Pulmonic valve:   Poorly visualized.  The valve appears to be grossly normal.    Doppler:  Transvalvular velocity was within the normal range.  No regurgitation.  ------------------------------------------------------------ Tricuspid valve:   Structurally normal valve.   Leaflet separation was normal.  Doppler:  Transvalvular velocity was within the normal range.  Trivial regurgitation.  ------------------------------------------------------------ Pulmonary artery:    Systolic pressure could not be accurately estimated.  ------------------------------------------------------------ Right atrium:  The atrium was normal in size.  ------------------------------------------------------------ Pericardium:  There was no pericardial effusion.  ------------------------------------------------------------  2D measurements        Normal  Doppler measurements   Normal Left ventricle                 Left ventricle LVID ED,   45.4 mm     43-52   Ea, lat    5.37 cm/s   ------ chord,                         ann, tiss PLAX                           DP LVID ES,   30.7 mm     23-38   E/Ea, lat  27.3        ------ chord,                         ann, tiss     7 PLAX                           DP FS, chord,   32 %      >29     Ea, med    4.83 cm/s   ------  PLAX                           ann, tiss LVPW, ED   12.2 mm     ------  DP IVS/LVPW    0.9        <1.3    E/Ea, med  30.4        ------ ratio, ED                      ann, tiss     3 Ventricular septum             DP IVS, ED      11 mm     ------  LVOT LVOT                            Peak vel,   101 cm/s   ------ Diam, S      20 mm     ------  S Area       3.14 cm^2   ------  VTI, S     21.6 cm     ------ Diam         20 mm     ------  Stroke vol 67.9 ml     ------ Aorta                          Stroke     33.6 ml/m^2 ------ Root diam,   31 mm     ------  index ED                             Aortic valve Left atrium                    Regurg PHT  743 ms     ------ AP dim       45 mm     ------  Mitral valve AP dim     2.23 cm/m^2 <2.2    Peak E vel  147 cm/s   ------ index                          Peak A vel  150 cm/s   ------                                Mean vel,   115 cm/s   ------                                D                                Decelerati  662 ms     150-23                                on time                0  Pressure    175 ms     ------                                half-time                                Mean          7 mm Hg  ------                                gradient,                                D                                Peak         16 mm Hg  ------                                gradient,                                D                                Peak E/A      1        ------                                ratio                                Area (PHT) 1.26 cm^2   ------                                Area index 0.62 cm^2/m ------                                (PHT)           ^2                                Area       0.98 cm^2   ------                                (LVOT)                                continuity                                Area index 0.49 cm^2/m ------                                (  LVOT           ^2                                cont)                                Annulus    69.3 cm     ------                                VTI                                Max regurg  453 cm/s   ------                                vel                                 Tricuspid valve                                Regurg      174 cm/s   ------                                peak vel                                Peak RV-RA   12 mm Hg  ------                                gradient,                                S                                Systemic veins                                Estimated     3 mm Hg  ------                                CVP                                Right ventricle                                Pressure,    15 mm Hg  <30  S                                Sa vel,    10.2 cm/s   ------                                lat ann,                                tiss DP   ------------------------------------------------------------ Prepared and Electronically Authenticated by  Croitoru, Mihai 2015-04-30T17:23:[id number]    Impression:  The patient has rheumatic mitral valve disease with stage D severe symptomatic mitral stenosis associated with chronic diastolic congestive heart failure and recurrent paroxysmal atrial fibrillation.  The patient is clearly symptomatic, although his primary complaint is that of chronic fatigue with relatively mild symptoms consistent with chronic diastolic congestive heart failure.   I have personally reviewed the patient's most recent transthoracic echocardiogram. The diastolic pressure half-time measured 175 ms corresponding to a calculated valve area of 1.26 cm with estimated peak and mean transvalvular gradients of 16 and 7 mm mercury, respectively.  There was thickening of both the anterior and posterior leaflets of the mitral valve with restricted leaflet mobility, with anatomical findings characteristic of rheumatic disease. There was very mild mitral regurgitation, moderate left atrial enlargement, and normal left ventricular size and systolic function. The patient may be a candidate for balloon mitral valvuloplasty. Alternatively, mitral valve repair or  replacement with concomitant Maze procedure could be considered.      Plan:  I discussed matters at length with the patient and his wife in the hospital room this afternoon.  I agree with plans for transesophageal echocardiogram.   I would be happy to see the patient in consultation to discuss surgical options further if anatomical findings by TEE are felt to be unfavorable for balloon valvuloplasty.  The patient should be anticoagulated with Coumadin.  I would additionally consider beginning amiodarone in effort to keep him out of atrial fibrillation.  All questions have been answered.   I spent in excess of 120 minutes during the conduct of this hospital consultation and >50% of this time involved direct face-to-face encounter for counseling and/or coordination of the patient's care.   Valentina Gu. Roxy Manns, MD 08/11/2013 12:19 PM

## 2013-08-11 NOTE — Progress Notes (Signed)
[place] for heparin Indication: atrial fibrillation  Allergies  Allergen Reactions  . Pravastatin     Myalgias    Patient Measurements: Height: 5\' 8"  (172.7 cm) Weight: 197 lb 11.2 oz (89.676 kg) IBW/kg (Calculated) : 68.4 Heparin Dosing Weight: 87kg  Vital Signs: Temp: 98 F (36.7 C) (05/22 0348) Temp src: Oral (05/22 0348) BP: 121/80 mmHg (05/22 0348) Pulse Rate: 66 (05/22 0348)  Labs:  Recent Labs  08/09/13 1300  08/10/13 0450 08/10/13 1400 08/10/13 2115 08/11/13 0350  HGB 18.2*  --  16.2  --   --  15.4  HCT 52.3*  --  48.5  --   --  44.8  PLT 269  --  241  --   --  274  APTT  --   --  45* 56* 72*  --   HEPARINUNFRC  --   < > 0.34 0.36 0.40 0.40  CREATININE 1.26  --  1.21  --   --  1.16  < > = values in this interval not displayed.  Estimated Creatinine Clearance: 73.7 ml/min (by C-G formula based on Cr of 1.16).  Medications:  Infusions:  . sodium chloride 20 mL/hr at 08/10/13 1951  . diltiazem (CARDIZEM) infusion 5 mg/hr (08/11/13 0507)  . heparin 1,500 Units/hr (08/10/13 1520)   Assessment: 56 yom presented to the ED with afib w/ RVR. He is on chronic xarelto for afib. Patient states he took his last dose 5/19 at ~1700 so it has been >24 hours since his last dose. Heparin levels are therapeutic, but it appears some Xarelto is still in his system as aPTTs are low and not correlating with heparin levels. Heparin level is therapeutic this morning at 0.4 units/mL.  He is without noted bleeding and CBC remains stable.     Goal of Therapy:  Heparin level 0.3-0.7 units/ml aPTT 66-102 seconds Monitor platelets by anticoagulation protocol: Yes   Plan:  1. Continue IV heparin at 1500 units/hr 2. Daily heparin level, aPTT and CBC 3. F/u transition back to oral anticoagulation  Rober Minion, PharmD., MS Clinical Pharmacist Pager:  [phone] Thank you for allowing pharmacy to be part of this patients care  team. 08/11/2013 6:25 AM

## 2013-08-11 NOTE — Discharge Summary (Signed)
Patient ID: Kevin English,  MRN: [id number], DOB/AGE: 1956/02/08 58 y.o.  Admit date: 08/09/2013 Discharge date: 08/11/2013  Primary Care Provider: Gilford Rile, MD  Primary Cardiologist:  Dr Dorothy Spark   Discharge Diagnoses Principal Problem:   Acute on chronic diastolic CHF (congestive heart failure), NYHA class 3 Active Problems:   PAF- AF with RVR on admission 08/09/13   Fatigue   Dyspnea on exertion   Mitral stenosis   Chronic anticoagulation   Rheumatic heart disease   Hypothyroid    Hospital Course:    58 years old male with h/o HTN, hyperlipidemia, former smoker, who is complaining of fatigue and SOB. He has had a cardiac work-up in 2011 including exercise nuclear stress test that was negative and an echocardiogram that showed normal ejection fraction.Marland Kitchen He was sent here by his PCP for concern of fatigue. He was recently diagnosed with hypothyroidism and just started on Synthroid. The patient denies any prior chest pain. His lifestyle is very sedentary. The patient comments that although he feel tired there has been no change in the last 3 years. He denies SOB on regular exertion.           He came to the outpatiet clinic last week after 6 months and complains of profound fatigue. He was started on Synthroid in September for hypothyroidism and he states that originally he felt more energetic but over time he feels the same. He also states that his blood pressure has been running low so he stopped taking Toprol-XL and amlodipine 10 mg daily and his Hyzaar that he just restarted on Tuesday. The patient also stopped taking pravastatin as he did well lower extremity pain that resolved after discontinuation. The patient has noticed that when he checks his blood pressure many times his heart rate will be 160 and other occasion 50.             The patient had a monitor that showed sustained atrial fibrillation with a heart rate 160-180 beats per minute. This started on Saturday,  07/29/2013 at 7 AM it persisted till at least 11 PM. His echocardiogram from last week showed preserved LV function but rheumatic mitral valve with moderate stenosis and moderately dilated left atrium. This is new when compared to the prior echocardiogram in 2011 where he had rheumatic mitral valve but no mitral stenosis. Dr Derinda Sis at [facility] was contacted and echo was sent for review for consideration of mitral annuloplasty.  He presented 08/09/13 with worsening SOB and palpitations and is found to be in atrial flutter with 2:1 block, then went to a-fib with ventricular rate 160-180 BPM. He was started on IV Diltiazem and converted spontaneously to NSR. He did diurese 4L and felt improved on 08/11/13. Dr Irish Lack feels he can be discharged 08/11/13. The patient is scheduled to have a TEE Wednesday 5/27 at 10am (case # [id number]) with Dr [name], and Dr Guy Sandifer office has been asked to contact the patient about an office visit with him on 08/18/13.    Discharge Vitals:  Blood pressure 139/80, pulse 62, temperature 97.8 F (36.6 C), temperature source Oral, resp. rate 18, height 5' 8"  (1.727 m), weight 197 lb 11.2 oz (89.676 kg), SpO2 99.00%.    Labs: Results for orders placed during the hospital encounter of 08/09/13 (from the past 48 hour(s))  COMPREHENSIVE METABOLIC PANEL     Status: Abnormal   Collection Time    08/09/13  1:00 PM      Result  Value Ref Range   Sodium 143  137 - 147 mEq/L   Potassium 4.2  3.7 - 5.3 mEq/L   Chloride 107  96 - 112 mEq/L   CO2 23  19 - 32 mEq/L   Glucose, Bld 113 (*) 70 - 99 mg/dL   BUN 13  6 - 23 mg/dL   Creatinine, Ser 1.26  0.50 - 1.35 mg/dL   Calcium 10.5  8.4 - 10.5 mg/dL   Total Protein 7.4  6.0 - 8.3 g/dL   Albumin 3.7  3.5 - 5.2 g/dL   AST 18  0 - 37 U/L   ALT 26  0 - 53 U/L   Alkaline Phosphatase 105  39 - 117 U/L   Total Bilirubin 0.3  0.3 - 1.2 mg/dL   GFR calc non Af Amer 60 (*) >90 mL/min   GFR calc Af Amer 70 (*) >90 mL/min   Comment: (NOTE)      The eGFR has been calculated using the CKD EPI equation.     This calculation has not been validated in all clinical situations.     eGFR's persistently <90 mL/min signify possible Chronic Kidney     Disease.  CBC WITH DIFFERENTIAL     Status: Abnormal   Collection Time    08/09/13  1:00 PM      Result Value Ref Range   WBC 13.8 (*) 4.0 - 10.5 K/uL   RBC 5.60  4.22 - 5.81 MIL/uL   Hemoglobin 18.2 (*) 13.0 - 17.0 g/dL   HCT 52.3 (*) 39.0 - 52.0 %   MCV 93.4  78.0 - 100.0 fL   MCH 32.5  26.0 - 34.0 pg   MCHC 34.8  30.0 - 36.0 g/dL   RDW 14.1  11.5 - 15.5 %   Platelets 269  150 - 400 K/uL   Neutrophils Relative % 58  43 - 77 %   Lymphocytes Relative 27  12 - 46 %   Monocytes Relative 14 (*) 3 - 12 %   Eosinophils Relative 1  0 - 5 %   Basophils Relative 0  0 - 1 %   Neutro Abs 8.1 (*) 1.7 - 7.7 K/uL   Lymphs Abs 3.7  0.7 - 4.0 K/uL   Monocytes Absolute 1.9 (*) 0.1 - 1.0 K/uL   Eosinophils Absolute 0.1  0.0 - 0.7 K/uL   Basophils Absolute 0.0  0.0 - 0.1 K/uL  BASIC METABOLIC PANEL     Status: Abnormal   Collection Time    08/10/13  4:50 AM      Result Value Ref Range   Sodium 143  137 - 147 mEq/L   Potassium 3.8  3.7 - 5.3 mEq/L   Chloride 107  96 - 112 mEq/L   CO2 22  19 - 32 mEq/L   Glucose, Bld 153 (*) 70 - 99 mg/dL   BUN 14  6 - 23 mg/dL   Creatinine, Ser 1.21  0.50 - 1.35 mg/dL   Calcium 9.0  8.4 - 10.5 mg/dL   GFR calc non Af Amer 63 (*) >90 mL/min   GFR calc Af Amer 73 (*) >90 mL/min   Comment: (NOTE)     The eGFR has been calculated using the CKD EPI equation.     This calculation has not been validated in all clinical situations.     eGFR's persistently <90 mL/min signify possible Chronic Kidney     Disease.  HEPARIN LEVEL (UNFRACTIONATED)     Status:  None   Collection Time    08/10/13  4:50 AM      Result Value Ref Range   Heparin Unfractionated 0.34  0.30 - 0.70 IU/mL   Comment:            IF HEPARIN RESULTS ARE BELOW     EXPECTED VALUES, AND PATIENT      DOSAGE HAS BEEN CONFIRMED,     SUGGEST FOLLOW UP TESTING     OF ANTITHROMBIN III LEVELS.  APTT     Status: Abnormal   Collection Time    08/10/13  4:50 AM      Result Value Ref Range   aPTT 45 (*) 24 - 37 seconds   Comment:            IF BASELINE aPTT IS ELEVATED,     SUGGEST PATIENT RISK ASSESSMENT     BE USED TO DETERMINE APPROPRIATE     ANTICOAGULANT THERAPY.  CBC     Status: Abnormal   Collection Time    08/10/13  4:50 AM      Result Value Ref Range   WBC 13.1 (*) 4.0 - 10.5 K/uL   RBC 5.12  4.22 - 5.81 MIL/uL   Hemoglobin 16.2  13.0 - 17.0 g/dL   HCT 48.5  39.0 - 52.0 %   MCV 94.7  78.0 - 100.0 fL   MCH 31.6  26.0 - 34.0 pg   MCHC 33.4  30.0 - 36.0 g/dL   RDW 14.2  11.5 - 15.5 %   Platelets 241  150 - 400 K/uL  PRO B NATRIURETIC PEPTIDE     Status: Abnormal   Collection Time    08/10/13  4:50 AM      Result Value Ref Range   Pro B Natriuretic peptide (BNP) 1467.0 (*) 0 - 125 pg/mL  APTT     Status: Abnormal   Collection Time    08/10/13  2:00 PM      Result Value Ref Range   aPTT 56 (*) 24 - 37 seconds   Comment:            IF BASELINE aPTT IS ELEVATED,     SUGGEST PATIENT RISK ASSESSMENT     BE USED TO DETERMINE APPROPRIATE     ANTICOAGULANT THERAPY.  HEPARIN LEVEL (UNFRACTIONATED)     Status: None   Collection Time    08/10/13  2:00 PM      Result Value Ref Range   Heparin Unfractionated 0.36  0.30 - 0.70 IU/mL   Comment:            IF HEPARIN RESULTS ARE BELOW     EXPECTED VALUES, AND PATIENT     DOSAGE HAS BEEN CONFIRMED,     SUGGEST FOLLOW UP TESTING     OF ANTITHROMBIN III LEVELS.  MRSA PCR SCREENING     Status: None   Collection Time    08/10/13  3:14 PM      Result Value Ref Range   MRSA by PCR NEGATIVE  NEGATIVE   Comment:            The GeneXpert MRSA Assay (FDA     approved for NASAL specimens     only), is one component of a     comprehensive MRSA colonization     surveillance program. It is not     intended to diagnose MRSA      infection nor to guide or     monitor treatment  for     MRSA infections.  APTT     Status: Abnormal   Collection Time    08/10/13  9:15 PM      Result Value Ref Range   aPTT 72 (*) 24 - 37 seconds   Comment:            IF BASELINE aPTT IS ELEVATED,     SUGGEST PATIENT RISK ASSESSMENT     BE USED TO DETERMINE APPROPRIATE     ANTICOAGULANT THERAPY.  HEPARIN LEVEL (UNFRACTIONATED)     Status: None   Collection Time    08/10/13  9:15 PM      Result Value Ref Range   Heparin Unfractionated 0.40  0.30 - 0.70 IU/mL   Comment:            IF HEPARIN RESULTS ARE BELOW     EXPECTED VALUES, AND PATIENT     DOSAGE HAS BEEN CONFIRMED,     SUGGEST FOLLOW UP TESTING     OF ANTITHROMBIN III LEVELS.  BASIC METABOLIC PANEL     Status: Abnormal   Collection Time    08/11/13  3:50 AM      Result Value Ref Range   Sodium 139  137 - 147 mEq/L   Potassium 4.1  3.7 - 5.3 mEq/L   Chloride 102  96 - 112 mEq/L   CO2 25  19 - 32 mEq/L   Glucose, Bld 110 (*) 70 - 99 mg/dL   BUN 16  6 - 23 mg/dL   Creatinine, Ser 1.16  0.50 - 1.35 mg/dL   Calcium 9.8  8.4 - 10.5 mg/dL   GFR calc non Af Amer 67 (*) >90 mL/min   GFR calc Af Amer 77 (*) >90 mL/min   Comment: (NOTE)     The eGFR has been calculated using the CKD EPI equation.     This calculation has not been validated in all clinical situations.     eGFR's persistently <90 mL/min signify possible Chronic Kidney     Disease.  CBC     Status: Abnormal   Collection Time    08/11/13  3:50 AM      Result Value Ref Range   WBC 13.1 (*) 4.0 - 10.5 K/uL   RBC 4.74  4.22 - 5.81 MIL/uL   Hemoglobin 15.4  13.0 - 17.0 g/dL   HCT 44.8  39.0 - 52.0 %   MCV 94.5  78.0 - 100.0 fL   MCH 32.5  26.0 - 34.0 pg   MCHC 34.4  30.0 - 36.0 g/dL   RDW 14.1  11.5 - 15.5 %   Platelets 274  150 - 400 K/uL  HEPARIN LEVEL (UNFRACTIONATED)     Status: None   Collection Time    08/11/13  3:50 AM      Result Value Ref Range   Heparin Unfractionated 0.40  0.30 - 0.70 IU/mL     Comment:            IF HEPARIN RESULTS ARE BELOW     EXPECTED VALUES, AND PATIENT     DOSAGE HAS BEEN CONFIRMED,     SUGGEST FOLLOW UP TESTING     OF ANTITHROMBIN III LEVELS.    Disposition:      Follow-up Information   Follow up with Dorothy Spark, MD. (office will call you with time for TEE next week.)    Specialty:  Cardiology   Contact information:   [address] [place] [id number] [phone]       Discharge Medications:    Medication List    STOP taking these medications       nebivolol 5 MG tablet  Commonly known as:  BYSTOLIC      TAKE these medications       diltiazem 120 MG 24 hr capsule  Commonly known as:  CARDIZEM CD  Take 1 capsule (120 mg total) by mouth daily.     furosemide 40 MG tablet  Commonly known as:  LASIX  Take 1 tablet (40 mg total) by mouth daily.     levothyroxine 50 MCG tablet  Commonly known as:  SYNTHROID, LEVOTHROID  50 mcg daily.     potassium chloride SA 20 MEQ tablet  Commonly known as:  K-DUR,KLOR-CON  Take 1 tablet (20 mEq total) by mouth daily.     rivaroxaban 20 MG Tabs tablet  Commonly known as:  XARELTO  Take 1 tablet (20 mg total) by mouth daily with supper.         Duration of Discharge Encounter: Greater than 30 minutes including physician time.  Signed, Kerin Ransom PA-C 08/11/2013 10:51 AM  I have examined the patient and reviewed assessment and plan and discussed with patient.  Agree with above as stated.  Plan for outpatient TEE. Spontaneous conversion to normal sinus rhythm. Please see my progress note for today.  Jettie Booze

## 2013-08-11 NOTE — Discharge Instructions (Signed)
Atrial Fibrillation °Atrial fibrillation is a condition that causes your heart to beat irregularly. It may also cause your heart to beat faster than normal. Atrial fibrillation can prevent your heart from pumping blood normally. It increases your risk of stroke and heart problems. °HOME CARE °· Take medications as told by your doctor. °· Only take medications that your doctor says are safe. Some medications can make the condition worse or happen again. °· If blood thinners were prescribed by your doctor, take them exactly as told. Too much can cause bleeding. Too little and you will not have the needed protection against stroke and other problems. °· Perform blood tests at home if told by your doctor. °· Perform blood tests exactly as told by your doctor. °· Do not drink alcohol. °· Do not drink beverages with caffeine such as coffee, soda, and some teas. °· Maintain a healthy weight. °· Do not use diet pills unless your doctor says they are safe. They may make heart problems worse. °· Follow diet instructions as told by your doctor. °· Exercise regularly as told by your doctor. °· Keep all follow-up appointments. °GET HELP RIGHT AWAY IF:  °· You have chest or belly (abdominal) pain. °· You feel sick to your stomach (nauseous) °· You suddenly have swollen feet and ankles. °· You feel dizzy. °· You face, arms, or legs feel numb or weak. °· There is a change in your vision or speech. °· You notice a change in the speed, rhythm, or strength of your heartbeat. °· You suddenly begin peeing (urinating) more often. °· You get tired more easily when moving or exercising. °MAKE SURE YOU:  °· Understand these instructions. °· Will watch your condition. °· Will get help right away if you are not doing well or get worse. °Document Released: 12/17/2007 Document Revised: 07/04/2012 Document Reviewed: 04/19/2012 °[employer]® Patient Information ©2014 [employer]. ° °

## 2013-08-11 NOTE — Progress Notes (Signed)
SUBJECTIVE:  Patient feels better. He converted to normal sinus rhythm at 4:30 in the afternoon yesterday.  OBJECTIVE:   Vitals:   Filed Vitals:   08/10/13 2000 08/10/13 2348 08/11/13 0348 08/11/13 0745  BP: 133/83 134/70 121/80 137/79  Pulse: 61 61 66 62  Temp: 97.7 F (36.5 C) 98 F (36.7 C) 98 F (36.7 C) 97.8 F (36.6 C)  TempSrc: Oral Oral Oral Oral  Resp: 20 20 15 18   Height:      Weight:      SpO2: 97% 97% 96% 99%   I&O's:   Intake/Output Summary (Last 24 hours) at 08/11/13 0851 Last data filed at 08/11/13 0800  Gross per 24 hour  Intake 813.75 ml  Output   3125 ml  Net -2311.25 ml   TELEMETRY: Reviewed telemetry pt in normal sinus rhythm/ sinus bradycardia:     PHYSICAL EXAM General: Well developed, well nourished, in no acute distress Head:   Normal cephalic and atramatic  Lungs:   Clear bilaterally to auscultation. Heart:   HRRR S1 S2  No JVD.   Abdomen: abdomen soft and non-tender Msk:  Back normal,  Normal strength and tone for age. Extremities:   No edema.   Neuro: Alert and oriented. Psych:  Normal affect, responds appropriately   LABS: Basic Metabolic Panel:  Recent Labs  08/10/13 0450 08/11/13 0350  NA 143 139  K 3.8 4.1  CL 107 102  CO2 22 25  GLUCOSE 153* 110*  BUN 14 16  CREATININE 1.21 1.16  CALCIUM 9.0 9.8   Liver Function Tests:  Recent Labs  08/09/13 1300  AST 18  ALT 26  ALKPHOS 105  BILITOT 0.3  PROT 7.4  ALBUMIN 3.7   No results found for this basename: LIPASE, AMYLASE,  in the last 72 hours CBC:  Recent Labs  08/09/13 1300 08/10/13 0450 08/11/13 0350  WBC 13.8* 13.1* 13.1*  NEUTROABS 8.1*  --   --   HGB 18.2* 16.2 15.4  HCT 52.3* 48.5 44.8  MCV 93.4 94.7 94.5  PLT 269 241 274   Cardiac Enzymes: No results found for this basename: CKTOTAL, CKMB, CKMBINDEX, TROPONINI,  in the last 72 hours BNP: No components found with this basename: POCBNP,  D-Dimer: No results found for this basename: DDIMER,   in the last 72 hours Hemoglobin A1C: No results found for this basename: HGBA1C,  in the last 72 hours Fasting Lipid Panel: No results found for this basename: CHOL, HDL, LDLCALC, TRIG, CHOLHDL, LDLDIRECT,  in the last 72 hours Thyroid Function Tests: No results found for this basename: TSH, T4TOTAL, FREET3, T3FREE, THYROIDAB,  in the last 72 hours Anemia Panel: No results found for this basename: VITAMINB12, FOLATE, FERRITIN, TIBC, IRON, RETICCTPCT,  in the last 72 hours Coag Panel:   No results found for this basename: INR, PROTIME    RADIOLOGY: Dg Chest Port 1 View  08/09/2013   CLINICAL DATA:  TACHYCARDIA  EXAM: PORTABLE CHEST - 1 VIEW  COMPARISON:  DG CHEST 2V dated 05/20/2009  FINDINGS: Mild enlargement of the cardiac silhouette. Engorgement of the central pulmonary vascularity. Increased interstitial markings consistent with interstitial edema. No pleural effusion or alveolar infiltrate. No acute bony thoracic abnormality.  IMPRESSION: Congestive heart failure with mild pulmonary interstitial edema.   Electronically Signed   By: David  Martinique   On: 08/09/2013 13:32      ASSESSMENT: Kathyrn Lass:    1) atrial fibrillation: For IV diltiazem to Cardizem CD 120 mg daily.  Will stop IV heparin. Restart Xarelto 20 mg daily. He spontaneously converted to normal sinus rhythm.  Will send him home on Cardizem CD 120 mg daily.  2) mitral stenosis: There is schedule for him to have transesophageal echocardiogram today.Since he is feeling well, will plan on discharge later today. He will have outpatient TEE next week on a day that Dr. [name] is doing TEEs..  3) fluid overload: He diureses well. Will send him home on Lasix 40 mg daily along with supplemental potassium.  Jettie Booze, MD  08/11/2013  8:51 AM

## 2013-08-15 ENCOUNTER — Telehealth: Payer: Self-pay | Admitting: Cardiology

## 2013-08-15 NOTE — Telephone Encounter (Signed)
This pt calling to confirm his TEE for tomorrow 08/16/13 at 10 am with Dr [name].  Dr [name] instructs this pt to come to [facility] at [facility] at 8 am for pre procedure work-up.  Per Dr [name] this pt was informed to have a driver there for procedure and to have nothing by mouth after midnight.  Dr [name] scheduled this pt for a TEE last week when he was seen in the ED.  Pt verbalized understanding of procedure and when to arrive at [facility].  Wife will transport via private vehicle.  Wife and pt pleased with the follow-up.

## 2013-08-15 NOTE — Telephone Encounter (Signed)
New message    Wife calling does her husband suppose to have a TEE done on tomorrow.

## 2013-08-16 ENCOUNTER — Encounter (HOSPITAL_COMMUNITY): Payer: Self-pay

## 2013-08-16 ENCOUNTER — Encounter (HOSPITAL_COMMUNITY): Admission: RE | Payer: Self-pay | Source: Ambulatory Visit

## 2013-08-16 ENCOUNTER — Encounter (HOSPITAL_COMMUNITY)
Admission: RE | Disposition: A | Discharge status: Home / Self Care | Payer: Self-pay | Source: Ambulatory Visit | Attending: Cardiology

## 2013-08-16 ENCOUNTER — Ambulatory Visit (HOSPITAL_COMMUNITY)
Admission: RE | Admit: 2013-08-16 | Discharge: 2013-08-16 | Disposition: A | Discharge status: Home / Self Care | Payer: Managed Care, Other (non HMO) | Source: Ambulatory Visit | Attending: Cardiology | Admitting: Cardiology

## 2013-08-16 ENCOUNTER — Ambulatory Visit (HOSPITAL_COMMUNITY)
Admission: RE | Admit: 2013-08-16 | Payer: Managed Care, Other (non HMO) | Source: Ambulatory Visit | Admitting: Cardiology

## 2013-08-16 DIAGNOSIS — I359 Nonrheumatic aortic valve disorder, unspecified: Secondary | ICD-10-CM

## 2013-08-16 DIAGNOSIS — I050 Rheumatic mitral stenosis: Secondary | ICD-10-CM | POA: Insufficient documentation

## 2013-08-16 DIAGNOSIS — I099 Rheumatic heart disease, unspecified: Secondary | ICD-10-CM | POA: Insufficient documentation

## 2013-08-16 HISTORY — PX: TEE WITHOUT CARDIOVERSION: SHX5443

## 2013-08-16 SURGERY — ECHOCARDIOGRAM, TRANSESOPHAGEAL
Anesthesia: Moderate Sedation

## 2013-08-16 MED ORDER — MIDAZOLAM HCL 10 MG/2ML IJ SOLN
INTRAMUSCULAR | Status: DC | PRN
  Administered 2013-08-16 (×2): 2 mg via INTRAVENOUS

## 2013-08-16 MED ORDER — FENTANYL CITRATE 0.05 MG/ML IJ SOLN
INTRAMUSCULAR | Status: AC
  Filled 2013-08-16: qty 2

## 2013-08-16 MED ORDER — BUTAMBEN-TETRACAINE-BENZOCAINE 2-2-14 % EX AERO
INHALATION_SPRAY | CUTANEOUS | Status: DC | PRN
  Administered 2013-08-16: 2 via TOPICAL

## 2013-08-16 MED ORDER — MIDAZOLAM HCL 5 MG/ML IJ SOLN
INTRAMUSCULAR | Status: AC
  Filled 2013-08-16: qty 2

## 2013-08-16 MED ORDER — SODIUM CHLORIDE 0.9 % IV SOLN
INTRAVENOUS | Status: DC
  Administered 2013-08-16: 500 mL via INTRAVENOUS

## 2013-08-16 MED ORDER — FENTANYL CITRATE 0.05 MG/ML IJ SOLN
INTRAMUSCULAR | Status: DC | PRN
  Administered 2013-08-16: 25 ug via INTRAVENOUS
  Administered 2013-08-16: 50 ug via INTRAVENOUS

## 2013-08-16 NOTE — Progress Notes (Signed)
Echocardiogram Echocardiogram Transesophageal has been performed.  Alyson Locket Casilda Pickerill 08/16/2013, 10:49 AM

## 2013-08-16 NOTE — Interval H&P Note (Signed)
History and Physical Interval Note:  08/16/2013 9:27 AM  Kevin English  has presented today for surgery, with the diagnosis of Mitral stenosis  The various methods of treatment have been discussed with the patient and family. After consideration of risks, benefits and other options for treatment, the patient has consented to  Procedure(s): TRANSESOPHAGEAL ECHOCARDIOGRAM (TEE) (N/A) as a surgical intervention .  The patient's history has been reviewed, patient examined, no change in status, stable for surgery.  I have reviewed the patient's chart and labs.  Questions were answered to the patient's satisfaction.     Dorothy Spark

## 2013-08-16 NOTE — H&P (View-Only) (Signed)
Patient ID: Kevin English, male   DOB: November 06, 1955, 58 y.o.   MRN: [id number]  The patient was started on e-CARDIO monitor that showed sustained atrial fibrillation with a heart rate 160-180 beats per minute. This started on Saturday, 07/29/2013 at 7 AM it persisted till at least 11 PM. We don't have recordings after that. I called the patient this morning right after I obtained these recordings and the patient states that on Saturday he felt miserable, very short of breath and felt like he can't even make a few steps. Today his heart rate is normal, and he feels significantly better. He is echocardiogram from last week is showing preserved LV function but rheumatic mitral valve with moderate stenosis and moderately dilated left atrium. This is new when compared to the prior echocardiogram in 2011 where he had rheumatic mitral valve but no mitral stenosis. We will schedule the patient for an office visit with me tomorrow to check his EKG, and adjust his medication with increase beta blocker, and we will also discuss starting of anticoagulation. We also need to review his echocardiogram and consider mitral valve annuloplasty.  Dorothy Spark 07/31/2013

## 2013-08-16 NOTE — Discharge Instructions (Signed)
Conscious Sedation, Adult, Care After Refer to this sheet in the next few weeks. These instructions provide you with information on caring for yourself after your procedure. Your health care provider may also give you more specific instructions. Your treatment has been planned according to current medical practices, but problems sometimes occur. Call your health care provider if you have any problems or questions after your procedure. WHAT TO EXPECT AFTER THE PROCEDURE  After your procedure:  You may feel sleepy, clumsy, and have poor balance for several hours.  Vomiting may occur if you eat too soon after the procedure. HOME CARE INSTRUCTIONS  Do not participate in any activities where you could become injured for at least 24 hours. Do not:  Drive.  Swim.  Ride a bicycle.  Operate heavy machinery.  Cook.  Use power tools.  Climb ladders.  Work from a high place.  Do not make important decisions or sign legal documents until you are improved.  If you vomit, drink water, juice, or soup when you can drink without vomiting. Make sure you have little or no nausea before eating solid foods.  Only take over-the-counter or prescription medicines for pain, discomfort, or fever as directed by your health care provider.  Make sure you and your family fully understand everything about the medicines given to you, including what side effects may occur.  You should not drink alcohol, take sleeping pills, or take medicines that cause drowsiness for at least 24 hours.  If you smoke, do not smoke without supervision.  If you are feeling better, you may resume normal activities 24 hours after you were sedated.  Keep all appointments with your health care provider. SEEK MEDICAL CARE IF:  Your skin is pale or bluish in color.  You continue to feel nauseous or vomit.  Your pain is getting worse and is not helped by medicine.  You have bleeding or swelling.  You are still sleepy or  feeling clumsy after 24 hours. SEEK IMMEDIATE MEDICAL CARE IF:  You develop a rash.  You have difficulty breathing.  You develop any type of allergic problem.  You have a fever. MAKE SURE YOU:  Understand these instructions.  Will watch your condition.  Will get help right away if you are not doing well or get worse. Document Released: 12/28/2012 Document Reviewed: 10/14/2012 [facility] Patient Information 2014 [place], [place].   Transesophageal Echocardiography Transesophageal echocardiography (TEE) is a picture test of your heart using sound waves. The pictures taken can give very detailed pictures of your heart. This can help your doctor see if there are problems with your heart. TEE can check:  If your heart has blood clots in it.  How well your heart valves are working.  If you have an infection on the inside of your heart.  Some of the major arteries of your heart.  If your heart valve is working after a Office manager.  Your heart before a procedure that uses a shock to your heart to get the rhythm back to normal. BEFORE THE PROCEDURE  Do not eat or drink for 6 hours before the procedure or as told by your doctor.  Make plans to have someone drive you home after the procedure. Do not drive yourself home.  An IV tube will be put in your arm. PROCEDURE  You will be given a medicine to help you relax (sedative). It will be given through the IV tube.  A numbing medicine will be sprayed in the back of your throat  to help numb it.  The tip of the probe is placed into the back of your mouth. You will be asked to swallow. This helps to pass the probe into your esophagus.  Once the tip of the probe is in the right place, your doctor can take pictures of your heart.  You may feel pressure at the back of your throat. AFTER THE PROCEDURE  You will be taken to a recovery area so the sedative can wear off.  Your throat may be sore and scratchy. This will go away slowly over  time.  You will go home when you are fully awake and able to swallow liquids.  You should have someone stay with you for the next 24 hours. Document Released: 01/04/2009 Document Revised: 12/28/2012 Document Reviewed: 09/08/2012 [facility] Patient Information 2014 [place], [place].

## 2013-08-16 NOTE — CV Procedure (Signed)
    Transesophageal Echocardiogram Note  Kevin English [id number] 1955/10/25  Procedure: Transesophageal Echocardiogram Indications: Rheumatic heart disease, mitral stenosis  Procedure Details Consent: Obtained Time Out: Verified patient identification, verified procedure, site/side was marked, verified correct patient position, special equipment/implants available, Radiology Safety Procedures followed,  medications/allergies/relevent history reviewed, required imaging and test results available.  Performed  Medications: Fentanyl: 75 mcg Versed: 4 mg  Left Ventrical: The cavity size was normal with normal LVEF 55-60%, no WMA, no LVH.   Mitral Valve: Hockey stick appearance of the mitral valve, predominantly of the anterior leaflet that is thickened and mildly calcified. Restricted opening. Peak trans mitral gradient 15 mmHg, mean gradient 7 mmHg. Mobility is restricted predominantly of the anterior leaflet. Diastolic leaflet doming was present.  Moderate mitral stenosis, trace mitral regurgitation.   Mitral annuloplasty score: 7  Aortic Valve: Mild aortic insufficiency.   Tricuspid Valve: Trace TR.   Pulmonic Valve: Grossly normal.  Left Atrium/ Left atrial appendage: Left atrium is moderately dilated. No thrombus, normal filling and emptying velocities.  Atrial septum: Intact, no ASD or PFO by color Doppler.  Aorta: Minimal AS plague.   Complications: No apparent complications Patient did tolerate procedure well.  Dorothy Spark, MD, [facility] 08/16/2013, 9:27 AM

## 2013-08-17 ENCOUNTER — Telehealth: Payer: Self-pay | Admitting: Cardiology

## 2013-08-17 ENCOUNTER — Telehealth: Payer: Self-pay | Admitting: Pharmacist

## 2013-08-17 ENCOUNTER — Encounter (HOSPITAL_COMMUNITY): Payer: Self-pay | Admitting: Cardiology

## 2013-08-17 MED ORDER — WARFARIN SODIUM 5 MG PO TABS: 5.0000 mg | ORAL_TABLET | Freq: Every day | ORAL | Status: DC

## 2013-08-17 NOTE — Telephone Encounter (Signed)
Spoke with patient and wife, and explained needed to change from Xarelto to coumadin given valve disease.  Patient may have either MV repair with Roxy Manns, procedure with Dr. Mina [facility]ble at [facility], or possibly a MVR.  This decision has not been made yet.  Patient advised to start warfarin 5 mg qd today, and to stop Xarelto after 08/18/13 dose.  Recheck INR here in 4 days.  appointment made.  Patient advised to avoid NSAIDs, and use tylenol for pain.

## 2013-08-17 NOTE — Telephone Encounter (Signed)
Message copied by Bishop Limbo on Thu Aug 17, 2013  9:58 AM ------      Message from: Avana Kreiser, Maralyn Sago      Created: Wed Aug 16, 2013  5:13 PM       Patient needs to be transitioned from Xarelto to warfarin due to rheumatic mitral valve stenosis.  May have valve surgery soon.              ----- Message -----         From: Dorothy Spark, MD         Sent: 08/16/2013   3:00 PM           To: Maralyn Sago Libero Puthoff, [place]            Yes            ----- Message -----         From: Maralyn Sago Diedra Sinor, [place]         Sent: 08/16/2013   2:56 PM           To: Dorothy Spark, MD            I can help arrange this.  Is patient going to have a MV repair or replacement in near future?              ----- Message -----         From: Dorothy Spark, MD         Sent: 08/16/2013  11:11 AM           To: Maralyn Sago Sheryl Towell, [place] Ysidro Evert,      I need to switch this gentleman from Xarelto Warfarin, ideally soon, he was started on anticoagulation for paroxysmal a-fib, but has rheumatic mitral stenosis. Would you be able to arrange for it?      Thank you,      Houston Siren                   ------

## 2013-08-17 NOTE — Telephone Encounter (Signed)
Echo (07/20/13 & 08/16/13 ) Mailed To  [facility]  Room [place], [place] 5.28.15/kdm

## 2013-08-18 ENCOUNTER — Encounter: Payer: Self-pay | Admitting: Internal Medicine

## 2013-08-18 ENCOUNTER — Telehealth: Payer: Self-pay | Admitting: *Deleted

## 2013-08-18 ENCOUNTER — Ambulatory Visit (INDEPENDENT_AMBULATORY_CARE_PROVIDER_SITE_OTHER): Payer: Managed Care, Other (non HMO) | Admitting: Internal Medicine

## 2013-08-18 ENCOUNTER — Encounter (HOSPITAL_COMMUNITY): Payer: Self-pay | Admitting: Emergency Medicine

## 2013-08-18 ENCOUNTER — Inpatient Hospital Stay (HOSPITAL_COMMUNITY)
Admission: EM | Admit: 2013-08-18 | Discharge: 2013-08-20 | DRG: 308 | Disposition: A | Discharge status: Home / Self Care | Payer: Managed Care, Other (non HMO) | Attending: Internal Medicine | Admitting: Internal Medicine

## 2013-08-18 ENCOUNTER — Emergency Department (HOSPITAL_COMMUNITY): Payer: Managed Care, Other (non HMO)

## 2013-08-18 VITALS — BP 144/85 | HR 167 | Ht 68.0 in | Wt 197.0 lb

## 2013-08-18 DIAGNOSIS — Z6829 Body mass index (BMI) 29.0-29.9, adult: Secondary | ICD-10-CM

## 2013-08-18 DIAGNOSIS — I5033 Acute on chronic diastolic (congestive) heart failure: Secondary | ICD-10-CM

## 2013-08-18 DIAGNOSIS — I4891 Unspecified atrial fibrillation: Secondary | ICD-10-CM

## 2013-08-18 DIAGNOSIS — I509 Heart failure, unspecified: Secondary | ICD-10-CM

## 2013-08-18 DIAGNOSIS — E039 Hypothyroidism, unspecified: Secondary | ICD-10-CM | POA: Diagnosis present

## 2013-08-18 DIAGNOSIS — I099 Rheumatic heart disease, unspecified: Secondary | ICD-10-CM | POA: Diagnosis present

## 2013-08-18 DIAGNOSIS — I050 Rheumatic mitral stenosis: Secondary | ICD-10-CM | POA: Diagnosis present

## 2013-08-18 DIAGNOSIS — Z7901 Long term (current) use of anticoagulants: Secondary | ICD-10-CM

## 2013-08-18 DIAGNOSIS — G4730 Sleep apnea, unspecified: Secondary | ICD-10-CM | POA: Diagnosis present

## 2013-08-18 DIAGNOSIS — D72829 Elevated white blood cell count, unspecified: Secondary | ICD-10-CM | POA: Diagnosis present

## 2013-08-18 DIAGNOSIS — I10 Essential (primary) hypertension: Secondary | ICD-10-CM | POA: Diagnosis present

## 2013-08-18 DIAGNOSIS — Z79899 Other long term (current) drug therapy: Secondary | ICD-10-CM

## 2013-08-18 DIAGNOSIS — F17200 Nicotine dependence, unspecified, uncomplicated: Secondary | ICD-10-CM | POA: Diagnosis present

## 2013-08-18 DIAGNOSIS — E782 Mixed hyperlipidemia: Secondary | ICD-10-CM | POA: Diagnosis present

## 2013-08-18 DIAGNOSIS — E785 Hyperlipidemia, unspecified: Secondary | ICD-10-CM | POA: Diagnosis present

## 2013-08-18 DIAGNOSIS — E669 Obesity, unspecified: Secondary | ICD-10-CM | POA: Diagnosis present

## 2013-08-18 DIAGNOSIS — I480 Paroxysmal atrial fibrillation: Secondary | ICD-10-CM

## 2013-08-18 LAB — PROTIME-INR
INR: 1.21 (ref 0.00–1.49)
Prothrombin Time: 15 seconds (ref 11.6–15.2)

## 2013-08-18 LAB — CBC WITH DIFFERENTIAL/PLATELET
BASOS PCT: 0 % (ref 0–1)
Basophils Absolute: 0 10*3/uL (ref 0.0–0.1)
Eosinophils Absolute: 0.2 10*3/uL (ref 0.0–0.7)
Eosinophils Relative: 1 % (ref 0–5)
HCT: 54.7 % — ABNORMAL HIGH (ref 39.0–52.0)
HEMOGLOBIN: 19.3 g/dL — AB (ref 13.0–17.0)
LYMPHS ABS: 7.3 10*3/uL — AB (ref 0.7–4.0)
Lymphocytes Relative: 33 % (ref 12–46)
MCH: 32.9 pg (ref 26.0–34.0)
MCHC: 35.3 g/dL (ref 30.0–36.0)
MCV: 93.2 fL (ref 78.0–100.0)
MONO ABS: 2 10*3/uL — AB (ref 0.1–1.0)
Monocytes Relative: 9 % (ref 3–12)
NEUTROS PCT: 57 % (ref 43–77)
Neutro Abs: 12.5 10*3/uL — ABNORMAL HIGH (ref 1.7–7.7)
Platelets: 336 10*3/uL (ref 150–400)
RBC: 5.87 MIL/uL — ABNORMAL HIGH (ref 4.22–5.81)
RDW: 13.9 % (ref 11.5–15.5)
WBC: 22 10*3/uL — ABNORMAL HIGH (ref 4.0–10.5)

## 2013-08-18 LAB — BASIC METABOLIC PANEL
BUN: 13 mg/dL (ref 6–23)
CHLORIDE: 104 meq/L (ref 96–112)
CO2: 20 mEq/L (ref 19–32)
CREATININE: 1.16 mg/dL (ref 0.50–1.35)
Calcium: 10.7 mg/dL — ABNORMAL HIGH (ref 8.4–10.5)
GFR calc non Af Amer: 67 mL/min — ABNORMAL LOW (ref >90)
GFR, EST AFRICAN AMERICAN: 77 mL/min — AB (ref >90)
GLUCOSE: 130 mg/dL — AB (ref 70–99)
Potassium: 4.4 mEq/L (ref 3.7–5.3)
Sodium: 140 mEq/L (ref 137–147)

## 2013-08-18 LAB — TROPONIN I: Troponin I: <0.3 ng/mL (ref <0.30)

## 2013-08-18 MED ORDER — DILTIAZEM HCL ER COATED BEADS 120 MG PO CP24
120.0000 mg | ORAL_CAPSULE | Freq: Every day | ORAL | Status: DC
  Administered 2013-08-19 – 2013-08-20 (×2): 120 mg via ORAL
  Filled 2013-08-18 (×2): qty 1

## 2013-08-18 MED ORDER — AMIODARONE HCL IN DEXTROSE 360-4.14 MG/200ML-% IV SOLN
INTRAVENOUS | Status: AC
  Filled 2013-08-18: qty 200

## 2013-08-18 MED ORDER — AMIODARONE HCL IN DEXTROSE 360-4.14 MG/200ML-% IV SOLN
60.0000 mg/h | INTRAVENOUS | Status: AC
  Administered 2013-08-18: 60 mg/h via INTRAVENOUS
  Filled 2013-08-18: qty 200

## 2013-08-18 MED ORDER — AMIODARONE HCL IN DEXTROSE 360-4.14 MG/200ML-% IV SOLN
30.0000 mg/h | INTRAVENOUS | Status: DC
  Administered 2013-08-18 – 2013-08-20 (×3): 30 mg/h via INTRAVENOUS
  Filled 2013-08-18 (×7): qty 200

## 2013-08-18 MED ORDER — AMIODARONE LOAD VIA INFUSION
150.0000 mg | Freq: Once | INTRAVENOUS | Status: AC
  Administered 2013-08-18: 150 mg via INTRAVENOUS
  Filled 2013-08-18: qty 83.34

## 2013-08-18 MED ORDER — SODIUM CHLORIDE 0.9 % IV SOLN
INTRAVENOUS | Status: DC
Start: 2013-08-18 — End: 2013-08-20
  Administered 2013-08-18: 18:00:00 via INTRAVENOUS

## 2013-08-18 MED ORDER — SODIUM CHLORIDE 0.9 % IV BOLUS (SEPSIS)
500.0000 mL | Freq: Once | INTRAVENOUS | Status: AC
  Administered 2013-08-18: 500 mL via INTRAVENOUS

## 2013-08-18 MED ORDER — LEVOTHYROXINE SODIUM 50 MCG PO TABS
50.0000 ug | ORAL_TABLET | Freq: Every day | ORAL | Status: DC
  Administered 2013-08-19 – 2013-08-20 (×2): 50 ug via ORAL
  Filled 2013-08-18 (×3): qty 1

## 2013-08-18 MED ORDER — HEPARIN (PORCINE) IN NACL 100-0.45 UNIT/ML-% IJ SOLN
1500.0000 [IU]/h | INTRAMUSCULAR | Status: DC
  Administered 2013-08-18 – 2013-08-19 (×2): 1500 [IU]/h via INTRAVENOUS
  Filled 2013-08-18 (×3): qty 250

## 2013-08-18 MED ORDER — WARFARIN - PHARMACIST DOSING INPATIENT: Freq: Every day | Status: DC

## 2013-08-18 MED ORDER — FUROSEMIDE 40 MG PO TABS
40.0000 mg | ORAL_TABLET | Freq: Every day | ORAL | Status: DC
  Administered 2013-08-19 – 2013-08-20 (×2): 40 mg via ORAL
  Filled 2013-08-18 (×2): qty 1

## 2013-08-18 MED ORDER — POTASSIUM CHLORIDE CRYS ER 20 MEQ PO TBCR
20.0000 meq | EXTENDED_RELEASE_TABLET | Freq: Every day | ORAL | Status: DC
  Administered 2013-08-19 – 2013-08-20 (×2): 20 meq via ORAL
  Filled 2013-08-18 (×2): qty 1

## 2013-08-18 MED ORDER — LEVOTHYROXINE SODIUM 50 MCG PO TABS: 50.0000 ug | ORAL_TABLET | Freq: Every day | ORAL | Status: DC

## 2013-08-18 MED ORDER — WARFARIN SODIUM 5 MG PO TABS
5.0000 mg | ORAL_TABLET | Freq: Once | ORAL | Status: AC
  Administered 2013-08-18: 5 mg via ORAL
  Filled 2013-08-18: qty 1

## 2013-08-18 NOTE — ED Provider Notes (Signed)
CSN: [id number]     Arrival date & time 08/18/13  1319 History   First MD Initiated Contact with Patient 08/18/13 1332     Chief Complaint  Patient presents with  . Atrial Fibrillation     (Consider location/radiation/quality/duration/timing/severity/associated sxs/prior Treatment) HPI Mr. Cappelletti is a 58 year old male who was admitted last week with atrial fibrillation with rapid ventricular response. He was rate controlled on Cardizem and then converted to normal sinus rhythm. He was discharged home on by mouth Cardizem. He has an event monitor at home from before this hospitalization. During the week he began feeling weaker and generally not well. He attached the event monitor tonight and was called by the cardiology office to come in today. His rhythm in the cardiology office was noted to be atrial fibrillation with rapid ventricular response and a rate of 170. Dr. Harrington Challenger saw and evaluated him. She subsequently sent him here for further evaluation and treatment. The patient denies chest pain, fever, chills, cough, nausea, or vomiting. He states he feels generally weak and not well. He has been taking his medications as prescribed. He is a smoker and continues to smoke. During the past week as an outpatient he has had a transesophageal echocardiogram that showed no evidence of thrombus. He was found to have moderate mitral stenosis Past Medical History  Diagnosis Date  . Other abnormal glucose   . Benign neoplasm of colon   . Hernia   . Chronic rhinitis   . Diverticulitis of colon   . Hyperlipidemia   . HTN (hypertension)   . Hypogonadism male   . Hyperlipemia   . Leukocytosis   . Myalgia and myositis, unspecified   . Obesity   . Vitamin D deficiency   . OSA on CPAP   . Hypothyroidism   . Atrial fibrillation     sustained 160-180 on e-CARDIO monitor placed 07/29/2013/notes 08/09/2013  . Rheumatic heart disease   . Acute on chronic congestive heart failure   . Mitral stenosis 08/02/2013   . paroxysmal atrial fibrillation - recurrent, with RVR on admission 08/09/13    Past Surgical History  Procedure Laterality Date  . Inguinal hernia repair Bilateral     "I had one side done twice"  . Eye surgery Right 1990    "reconstructive"  . Tee without cardioversion N/A 08/16/2013    Procedure: TRANSESOPHAGEAL ECHOCARDIOGRAM (TEE);  Surgeon: Dorothy Spark, MD;  Location: [facility] ENDOSCOPY;  Service: Cardiovascular;  Laterality: N/A;   Family History  Problem Relation Age of Onset  . Hypertension     History  Substance Use Topics  . Smoking status: Current Every Day Smoker -- 1.00 packs/day for 37 years    Types: Cigarettes  . Smokeless tobacco: Never Used  . Alcohol Use: No    Review of Systems  All other systems reviewed and are negative.     Allergies  Pravastatin  Home Medications   Prior to Admission medications   Medication Sig Start Date End Date Taking? Authorizing Provider  diltiazem (CARDIZEM CD) 120 MG 24 hr capsule Take 1 capsule (120 mg total) by mouth daily. 08/11/13  Yes Luke K Kilroy, PA-C  furosemide (LASIX) 40 MG tablet Take 1 tablet (40 mg total) by mouth daily. 08/11/13  Yes Luke K Kilroy, PA-C  levothyroxine (SYNTHROID, LEVOTHROID) 50 MCG tablet Take 50 mcg by mouth daily.  12/02/12  Yes Historical Provider, MD  potassium chloride SA (K-DUR,KLOR-CON) 20 MEQ tablet Take 1 tablet (20 mEq total) by mouth daily.  08/11/13  Yes Erlene Quan, PA-C  warfarin (COUMADIN) 5 MG tablet Take 1 tablet (5 mg total) by mouth daily. 08/17/13  Yes Dorothy Spark, MD  XARELTO 20 MG TABS tablet Take 20 mg by mouth daily with supper.  08/01/13  Yes Historical Provider, MD   BP 112/72  Pulse 141  Temp(Src) 98.1 F (36.7 C) (Oral)  Resp 23  SpO2 97% Physical Exam  Nursing note and vitals reviewed. Constitutional: He is oriented to person, place, and time. He appears well-developed and well-nourished.  HENT:  Head: Normocephalic and atraumatic.  Right Ear: External  ear normal.  Left Ear: External ear normal.  Nose: Nose normal.  Mouth/Throat: Oropharynx is clear and moist.  Eyes: Conjunctivae and EOM are normal. Pupils are equal, round, and reactive to light.  Neck: Normal range of motion. Neck supple.  Cardiovascular: Normal heart sounds and intact distal pulses.  An irregularly irregular rhythm present. Tachycardia present.   Pulmonary/Chest: Effort normal and breath sounds normal. No respiratory distress. He has no wheezes. He exhibits no tenderness.  Abdominal: Soft. Bowel sounds are normal. He exhibits no distension and no mass. There is no tenderness. There is no guarding.  Musculoskeletal: Normal range of motion.  Neurological: He is alert and oriented to person, place, and time. He has normal reflexes. He exhibits normal muscle tone. Coordination normal.  Skin: Skin is warm and dry.  Psychiatric: He has a normal mood and affect. His behavior is normal. Judgment and thought content normal.    ED Course  Procedures (including critical care time) Labs Review Labs Reviewed  CBC WITH DIFFERENTIAL - Abnormal; Notable for the following:    WBC 22.0 (*)    RBC 5.87 (*)    Hemoglobin 19.3 (*)    HCT 54.7 (*)    Neutro Abs 12.5 (*)    Lymphs Abs 7.3 (*)    Monocytes Absolute 2.0 (*)    All other components within normal limits  BASIC METABOLIC PANEL - Abnormal; Notable for the following:    Glucose, Bld 130 (*)    Calcium 10.7 (*)    GFR calc non Af Amer 67 (*)    GFR calc Af Amer 77 (*)    All other components within normal limits  PROTIME-INR  TROPONIN I    Imaging Review Dg Chest Port 1 View  08/18/2013   CLINICAL DATA:  Chest pain.  Hypertension.  EXAM: PORTABLE CHEST - 1 VIEW  COMPARISON:  07/2013  FINDINGS: The heart size and mediastinal contours are within normal limits. Both lungs are clear. The visualized skeletal structures are unremarkable.  IMPRESSION: No active disease.   Electronically Signed   By: Earle Gell M.D.   On:  08/18/2013 14:27     EKG Interpretation   Date/Time:  Friday Aug 18 2013 13:29:57 EDT Ventricular Rate:  158 PR Interval:    QRS Duration: 126 QT Interval:  330 QTC Calculation: 535 R Axis:   -165 Text Interpretation:  Atrial fibrillation with rapid ventricular response  Right bundle branch block T wave abnormality, consider inferior ischemia  Abnormal ECG Confirmed by Sharnette Kitamura MD, Andee Poles ([id number]) on 08/18/2013 4:03:19 PM      MDM   Final diagnoses:  Atrial fibrillation with RVR  Leukocytosis    Patient treated with 150 mg of IV amiodarone and placed on drip. Heart rate has decreased from 170 to 1:30. He has made pain and systolic blood pressures of 100-110. Labs are significant for white blood cell  count of 22,000. I have discussed this with him and queried him further regarding signs and symptoms of infection. He continues to deny any infectious symptoms. He is being given a fluid bolus. Patient's care was discussed with cardiology and they are to see and evaluate .   Discussed with Dr. Wyvonnia Dusky and he is aware patient continues to have elevated rate and cardiology is to see.   Shaune Pollack, MD 08/18/13 [phone]

## 2013-08-18 NOTE — ED Notes (Signed)
Pt has lens implant in right eye that makes pupil enlarged; deaf in left ear.

## 2013-08-18 NOTE — Progress Notes (Signed)
ANTICOAGULATION CONSULT NOTE - Initial Consult  Pharmacy Consult for warfarin, heparin Indication: atrial fibrillation  Allergies  Allergen Reactions  . Pravastatin Other (See Comments)    Myalgias     Patient Measurements:    Vital Signs: Temp: 98.1 F (36.7 C) (05/29 1444) Temp src: Oral (05/29 1444) BP: 106/86 mmHg (05/29 1749) Pulse Rate: 63 (05/29 1630)  Labs:  Recent Labs  08/18/13 1334  HGB 19.3*  HCT 54.7*  PLT 336  LABPROT 15.0  INR 1.21  CREATININE 1.16  TROPONINI <0.30    The CrCl is unknown because both a height and weight (above a minimum accepted value) are required for this calculation.   Medical History: Past Medical History  Diagnosis Date  . Other abnormal glucose   . Benign neoplasm of colon   . Hernia   . Chronic rhinitis   . Diverticulitis of colon   . Hyperlipidemia   . HTN (hypertension)   . Hypogonadism male   . Hyperlipemia   . Leukocytosis   . Myalgia and myositis, unspecified   . Obesity   . Vitamin D deficiency   . OSA on CPAP   . Hypothyroidism   . Atrial fibrillation     sustained 160-180 on e-CARDIO monitor placed 07/29/2013/notes 08/09/2013  . Rheumatic heart disease   . Acute on chronic congestive heart failure   . Mitral stenosis 08/02/2013  . paroxysmal atrial fibrillation - recurrent, with RVR on admission 08/09/13     Medications:  Infusions:  . sodium chloride 10 mL/hr at 08/18/13 1746  . amiodarone 60 mg/hr (08/18/13 1357)  . heparin      Assessment: 41 yom presented to the ED after being found to be in afib at MD's office. He was starting to be converted from xarelto to coumadin. He started coumadin 5/28 and was to stop xarelto 5/29 however, he did not take either of his doses today. INR is 1.21, H/H is elevated and plts are WNL.   Goal of Therapy:  INR 2-3 Heparin level 0.3-0.7 units/ml aPTT 66-102 seconds Monitor platelets by anticoagulation protocol: Yes   Plan:  1. Heparin gtt 1500 units/hr - no  bolus (previously therapeutic on this rate) 2. Check an 8 hour heparin level and aPTT 3. Daily heparin level, aPTT and CBC 4. Warfarin 5mg  PO x 1 tonight 5. Daily INR  Rande Lawman Ikram Riebe 08/18/2013,5:57 PM

## 2013-08-18 NOTE — Telephone Encounter (Signed)
Received ecardio report that was faxed on 5/29 concerning SERIOUS NOTIFICATION on pts event monitor.  Ecardio report revealed pt was back in rapid afib with rvr- rate 160-180s. Report showed that pt started going into this on 5/28 in the a.m. -5/29 Contacted pt and wife answered stating "I think my Husband is back into afib."  "He's been feeling very fatigued since yesterday morning." Asked wife if pt is experiencing any sob or cp.  Wife states "he denies any pain at all, he does have a little bit of sob." Wife states the only thing the pt has wanted to do since yesterday morning is sleep in the bed. "he's very fatigued." This pt has a recently been hospitalized for worsening sob, palpitations, aflutter, and afib with rapid ventricular rate of 160-180 on 5-20. Pt was discharged from hospital on 5/22 and was set up for outpt TEE with Dr [name] 5/27. Pt did have TEE done on 5/27.  Pt has been  transitioned from Xarelto to warfarin on 5/28 by Pharmacist at [facility] due to rheumatic mitral valve stenosis. Pt has been referred to Dr Mina Marble at [facility] per Dr [name] for possible valve surgery soon.  Went to DOD Dr Harrington Challenger about pts results, current status, and health hx info, and Dr Harrington Challenger advised that she will contact this pt and wife right now and call over to hospital so pt can be admitted and evaluated for his rapid afib.  Notified wife that our DOD will be contacting her very shortly to stay by the phone so she can further advise.  Wife verbalized understanding and agrees with this plan.  Will forward this to Dr [name] for review as well.

## 2013-08-18 NOTE — ED Notes (Signed)
Amiodaron infusing at 60mg /hr - EPIC order is locked in another computer.

## 2013-08-18 NOTE — ED Notes (Signed)
Pt reports last week he felt knot in his throat and a rush of adrenaline feeling but then went away.  Only complaints during that time. No chest pain currently.

## 2013-08-18 NOTE — ED Notes (Signed)
Pt receiving bolus at this time.

## 2013-08-18 NOTE — ED Notes (Signed)
Portable xray at bedside.

## 2013-08-18 NOTE — Patient Instructions (Signed)
Go by wheelchair to car.  Wife to take you to [facility] ER

## 2013-08-18 NOTE — ED Notes (Addendum)
Pt sent here from cardiology office where pt was noted to be in A Fib rate 150-160's. Pt denies any CP but reports increased fatigue and intermittent diaphoresis since last night. Pt recently admitted for new onset A fib and mitral valve stenosis last week. Pt is AO x4. PT in A fib 160's in triage.

## 2013-08-18 NOTE — Progress Notes (Addendum)
HPI [name] is a 58 yo who presents to clinic for evaluation of atrial fib The patient was recently discharged from [facility].  He was admitted with atrial fib with RVR (160s)  Started on IV diltiazem  Converted to SR Placed on po dilt  Dose limited by BP  Placed on anticoaguation. On Wed 5/27 (after d/c) he underwent TEE which showed no LA LAA thrombus.  Moderate mitral stenosis. The patient has an event monitor  Strips from yesterday and earlier today show afib with RVR Patient says he feels very very weak.  SOB with activity  Dizzy some wit hstanding  No CP  No syncope   Allergies  Allergen Reactions  . Pravastatin     Myalgias     Current Outpatient Prescriptions  Medication Sig Dispense Refill  . diltiazem (CARDIZEM CD) 120 MG 24 hr capsule Take 1 capsule (120 mg total) by mouth daily.  30 capsule  11  . furosemide (LASIX) 40 MG tablet Take 1 tablet (40 mg total) by mouth daily.  30 tablet  11  . levothyroxine (SYNTHROID, LEVOTHROID) 50 MCG tablet 50 mcg daily.      . potassium chloride SA (K-DUR,KLOR-CON) 20 MEQ tablet Take 1 tablet (20 mEq total) by mouth daily.  30 tablet  11  . warfarin (COUMADIN) 5 MG tablet Take 1 tablet (5 mg total) by mouth daily.  30 tablet  5  . XARELTO 20 MG TABS tablet        No current facility-administered medications for this visit.    Past Medical History  Diagnosis Date  . Other abnormal glucose   . Benign neoplasm of colon   . Hernia   . Chronic rhinitis   . Diverticulitis of colon   . Hyperlipidemia   . HTN (hypertension)   . Hypogonadism male   . Hyperlipemia   . Leukocytosis   . Myalgia and myositis, unspecified   . Obesity   . Vitamin D deficiency   . OSA on CPAP   . Hypothyroidism   . Atrial fibrillation     sustained 160-180 on e-CARDIO monitor placed 07/29/2013/notes 08/09/2013  . Rheumatic heart disease   . Acute on chronic congestive heart failure   . Mitral stenosis 08/02/2013  . paroxysmal atrial fibrillation -  recurrent, with RVR on admission 08/09/13     Past Surgical History  Procedure Laterality Date  . Inguinal hernia repair Bilateral     "I had one side done twice"  . Eye surgery Right 1990    "reconstructive"  . Tee without cardioversion N/A 08/16/2013    Procedure: TRANSESOPHAGEAL ECHOCARDIOGRAM (TEE);  Surgeon: Dorothy Spark, MD;  Location: [facility] ENDOSCOPY;  Service: Cardiovascular;  Laterality: N/A;    Family History  Problem Relation Age of Onset  . Hypertension      History   Social History  . Marital Status: Married    Spouse Name: N/A    Number of Children: N/A  . Years of Education: N/A   Occupational History  . Not on file.   Social History Main Topics  . Smoking status: Current Every Day Smoker -- 1.00 packs/day for 37 years    Types: Cigarettes  . Smokeless tobacco: Never Used  . Alcohol Use: No  . Drug Use: No  . Sexual Activity: Not Currently   Other Topics Concern  . Not on file   Social History Narrative  . No narrative on file    Review of Systems:  All systems  reviewed.  They are negative to the above problem except as previously stated.  Vital Signs: BP 144/85  Pulse 167  Ht 5\' 8"  (1.727 m)  Wt 197 lb (89.359 kg)  BMI 29.96 kg/m2  Physical Exam [name] is clammy  Says he feels bad, weak HEENT:  Normocephalic, atraumatic. EOMI, PERRLA.  Neck: JVP is normal.  No bruits.  Lungs: clear to auscultation.  Mild wheeze  Heart: Irregular rate and rhythm.Tachy Normal S1, S2. No S3.   No definite murmurs    Abdomen:  Supple, nontender. Normal bowel sounds. No masses. No hepatomegaly.  Extremities:   Good distal pulses throughout. No lower extremity edema.  Musculoskeletal :moving all extremities.  Neuro:   alert and oriented x3.  CN II-XII grossly intact.  EKG  Afib  167 bpm  RBBB. Assessment and Plan:  1.  Atrial fib  Recurrent.  Will probably be a problem until MV is definitively treated, possiblly longer. Would recomm that the patient be  admitted to [facility].  Discussed with EP.  Would recomm amiodarone IV as load  BP should tolerate though will need to be followed closely. There are no beds available. I have contacted ER (Dr Jeanell Sparrow)  Have instructed wife to take him there. Need to clarify anticoagulation  Should be on coumadin.    2.  Mitral stenosis.  Records have been or will be sent to A Wang at [facility] for evaluation for possible valvuloplasty.  3.  Acute on chronic diastolic CHF  Volume status not too bad  May be up a little (wheeze)  Follow closely.

## 2013-08-18 NOTE — ED Notes (Signed)
MD updated on pt's status; bolus complete and drip going.

## 2013-08-19 DIAGNOSIS — I4891 Unspecified atrial fibrillation: Principal | ICD-10-CM

## 2013-08-19 LAB — CBC
HCT: 47.9 % (ref 39.0–52.0)
HEMOGLOBIN: 16.8 g/dL (ref 13.0–17.0)
MCH: 32.7 pg (ref 26.0–34.0)
MCHC: 35.1 g/dL (ref 30.0–36.0)
MCV: 93.4 fL (ref 78.0–100.0)
Platelets: 259 10*3/uL (ref 150–400)
RBC: 5.13 MIL/uL (ref 4.22–5.81)
RDW: 13.9 % (ref 11.5–15.5)
WBC: 16.8 10*3/uL — ABNORMAL HIGH (ref 4.0–10.5)

## 2013-08-19 LAB — BASIC METABOLIC PANEL
BUN: 12 mg/dL (ref 6–23)
CO2: 19 meq/L (ref 19–32)
CREATININE: 1.1 mg/dL (ref 0.50–1.35)
Calcium: 9.3 mg/dL (ref 8.4–10.5)
Chloride: 105 mEq/L (ref 96–112)
GFR calc Af Amer: 82 mL/min — ABNORMAL LOW (ref >90)
GFR calc non Af Amer: 71 mL/min — ABNORMAL LOW (ref >90)
Glucose, Bld: 138 mg/dL — ABNORMAL HIGH (ref 70–99)
POTASSIUM: 4.2 meq/L (ref 3.7–5.3)
Sodium: 137 mEq/L (ref 137–147)

## 2013-08-19 LAB — PROTIME-INR
INR: 1.01 (ref 0.00–1.49)
PROTHROMBIN TIME: 13.1 s (ref 11.6–15.2)

## 2013-08-19 LAB — APTT: APTT: 62 s — AB (ref 24–37)

## 2013-08-19 LAB — HEPARIN LEVEL (UNFRACTIONATED)
Heparin Unfractionated: 0.45 IU/mL (ref 0.30–0.70)
Heparin Unfractionated: 0.4 IU/mL (ref 0.30–0.70)

## 2013-08-19 LAB — MAGNESIUM: Magnesium: 2.1 mg/dL (ref 1.5–2.5)

## 2013-08-19 MED ORDER — WARFARIN SODIUM 5 MG PO TABS
5.0000 mg | ORAL_TABLET | Freq: Once | ORAL | Status: DC
  Filled 2013-08-19: qty 1

## 2013-08-19 MED ORDER — WARFARIN SODIUM 7.5 MG PO TABS
7.5000 mg | ORAL_TABLET | Freq: Once | ORAL | Status: AC
  Administered 2013-08-19: 7.5 mg via ORAL
  Filled 2013-08-19: qty 1

## 2013-08-19 MED ORDER — AMIODARONE HCL 200 MG PO TABS
400.0000 mg | ORAL_TABLET | Freq: Two times a day (BID) | ORAL | Status: DC
  Administered 2013-08-19 – 2013-08-20 (×3): 400 mg via ORAL
  Filled 2013-08-19 (×4): qty 2

## 2013-08-19 NOTE — Progress Notes (Signed)
Patient converted to NSR around 0400 according to CMT.  Noticed on the monitor and called central telemetry to get a time.  HR currently in the 60s.  Will notify MD and continue to monitor.  [name]

## 2013-08-19 NOTE — Progress Notes (Signed)
Patient ID: MEDHANSH BRINKMEIER, male   DOB: January 13, 1956, 58 y.o.   MRN: [id number]   Patient Name: Kevin English Date of Encounter: 08/19/2013     Active Problems:   Atrial fibrillation with RVR    SUBJECTIVE  "I want to go home"  CURRENT MEDS . diltiazem  120 mg Oral Daily  . furosemide  40 mg Oral Daily  . levothyroxine  50 mcg Oral QAC breakfast  . potassium chloride SA  20 mEq Oral Daily  . Warfarin - Pharmacist Dosing Inpatient   Does not apply q1800    OBJECTIVE  Filed Vitals:   08/18/13 1749 08/18/13 1845 08/18/13 2011 08/19/13 0456  BP: 106/86 130/94 147/99 126/83  Pulse:   77 68  Temp:   97.6 F (36.4 C) 97.9 F (36.6 C)  TempSrc:   Oral Oral  Resp: 18  18 19   Height:   5' 8.11" (1.73 m)   Weight:   197 lb 1.5 oz (89.4 kg)   SpO2: 99% 98% 100% 99%    Intake/Output Summary (Last 24 hours) at 08/19/13 1218 Last data filed at 08/19/13 0600  Gross per 24 hour  Intake 733.06 ml  Output    400 ml  Net 333.06 ml   Filed Weights   08/18/13 2011  Weight: 197 lb 1.5 oz (89.4 kg)    PHYSICAL EXAM  General: Pleasant, middle aged man, NAD. Neuro: Alert and oriented X 3. Moves all extremities spontaneously HEENT:  Normal  Neck: Supple without bruits or JVD. Lungs:  Resp regular and unlabored, CTA. Heart: RRR no s3, s4, or murmurs. Abdomen: Soft, non-tender, non-distended, BS + x 4.  Extremities: No clubbing, cyanosis or edema. DP/PT/Radials 2+ and equal bilaterally.  Accessory Clinical Findings  CBC  Recent Labs  08/18/13 1334 08/19/13 0315  WBC 22.0* 16.8*  NEUTROABS 12.5*  --   HGB 19.3* 16.8  HCT 54.7* 47.9  MCV 93.2 93.4  PLT 336 671   Basic Metabolic Panel  Recent Labs  08/18/13 1334 08/19/13 0315  NA 140 137  K 4.4 4.2  CL 104 105  CO2 20 19  GLUCOSE 130* 138*  BUN 13 12  CREATININE 1.16 1.10  CALCIUM 10.7* 9.3  MG  --  2.1   Liver Function Tests No results found for this basename: AST, ALT, ALKPHOS, BILITOT, PROT, ALBUMIN,  in  the last 72 hours No results found for this basename: LIPASE, AMYLASE,  in the last 72 hours Cardiac Enzymes  Recent Labs  08/18/13 1334  TROPONINI <0.30   BNP No components found with this basename: POCBNP,  D-Dimer No results found for this basename: DDIMER,  in the last 72 hours Hemoglobin A1C No results found for this basename: HGBA1C,  in the last 72 hours Fasting Lipid Panel No results found for this basename: CHOL, HDL, LDLCALC, TRIG, CHOLHDL, LDLDIRECT,  in the last 72 hours Thyroid Function Tests No results found for this basename: TSH, T4TOTAL, FREET3, T3FREE, THYROIDAB,  in the last 72 hours  TELE nsr   Radiology/Studies  Dg Chest Port 1 View  08/18/2013   CLINICAL DATA:  Chest pain.  Hypertension.  EXAM: PORTABLE CHEST - 1 VIEW  COMPARISON:  07/2013  FINDINGS: The heart size and mediastinal contours are within normal limits. Both lungs are clear. The visualized skeletal structures are unremarkable.  IMPRESSION: No active disease.   Electronically Signed   By: Earle Gell M.D.   On: 08/18/2013 14:27   Dg Chest [facility]  1 View  08/09/2013   CLINICAL DATA:  TACHYCARDIA  EXAM: PORTABLE CHEST - 1 VIEW  COMPARISON:  DG CHEST 2V dated 05/20/2009  FINDINGS: Mild enlargement of the cardiac silhouette. Engorgement of the central pulmonary vascularity. Increased interstitial markings consistent with interstitial edema. No pleural effusion or alveolar infiltrate. No acute bony thoracic abnormality.  IMPRESSION: Congestive heart failure with mild pulmonary interstitial edema.   Electronically Signed   By: David  Martinique   On: 08/09/2013 13:32    ASSESSMENT AND PLAN  1. Atrial fib with an RVR 2. Mitral stenosis 3. Acute diastolic heart failure secondary to #1 and #2 Rec: ok to discharge home tomorrow if he maintains NSR today. Will continue IV amio and start oral amio today. Patient is very anxious to get his valve repaired. Delorice Bannister,M.D.  08/19/2013 12:18 PM

## 2013-08-19 NOTE — Progress Notes (Signed)
ANTICOAGULATION CONSULT NOTE - Follow-up Consult  Pharmacy Consult for warfarin, heparin Indication: atrial fibrillation  Allergies  Allergen Reactions  . Pravastatin Other (See Comments)    Myalgias     Patient Measurements: Height: 5' 8.11" (173 cm) Weight: 197 lb 1.5 oz (89.4 kg) IBW/kg (Calculated) : 68.65  Vital Signs: Temp: 97.9 F (36.6 C) (05/30 0456) Temp src: Oral (05/30 0456) BP: 126/83 mmHg (05/30 0456) Pulse Rate: 68 (05/30 0456)  Labs:  Recent Labs  08/18/13 1334 08/19/13 0230 08/19/13 0315 08/19/13 0955  HGB 19.3*  --  16.8  --   HCT 54.7*  --  47.9  --   PLT 336  --  259  --   APTT  --  62*  --   --   LABPROT 15.0  --  13.1  --   INR 1.21  --  1.01  --   HEPARINUNFRC  --  0.45  --  0.40  CREATININE 1.16  --  1.10  --   TROPONINI <0.30  --   --   --     Estimated Creatinine Clearance: 77.8 ml/min (by C-G formula based on Cr of 1.1).  Assessment: 17 yom presented to the ED after being found to be in afib at MD's office. He was starting to be converted from xarelto to coumadin. He started coumadin 5/28 and was to stop xarelto 5/29 however last Xarelto dose taken on 5/28. INR down to 1.01 past 2 doses of coumadin. CBC stable, no bleeding noted. Pt with new amio start which may potentiate effects of warfarin.  Goal of Therapy:  INR 2-3 Heparin level 0.3-0.7 units/ml aPTT 66-102 seconds Monitor platelets by anticoagulation protocol: Yes   Plan:  1. Continue Heparin gtt 1500 units/hr  2. Daily HL, aPTT, CBC, INR 3. Warfarin 7.5mg  tonight 4. Noted probably home tomorrow.  Sherlon Handing, PharmD, BCPS Clinical pharmacist, pager [phone] 08/19/2013,2:11 PM

## 2013-08-19 NOTE — Progress Notes (Signed)
ANTICOAGULATION CONSULT NOTE - Follow Up Consult  Pharmacy Consult for Heparin  Indication: atrial fibrillation  Allergies  Allergen Reactions  . Pravastatin Other (See Comments)    Myalgias     Patient Measurements: Height: 5' 8.11" (173 cm) Weight: 197 lb 1.5 oz (89.4 kg) IBW/kg (Calculated) : 68.65  Vital Signs: Temp: 97.6 F (36.4 C) (05/29 2011) Temp src: Oral (05/29 2011) BP: 147/99 mmHg (05/29 2011) Pulse Rate: 77 (05/29 2011)  Labs:  Recent Labs  08/18/13 1334 08/19/13 0230 08/19/13 0315  HGB 19.3*  --  16.8  HCT 54.7*  --  47.9  PLT 336  --  259  APTT  --  62*  --   LABPROT 15.0  --   --   INR 1.21  --   --   HEPARINUNFRC  --  0.45  --   CREATININE 1.16  --  1.10  TROPONINI <0.30  --   --     Estimated Creatinine Clearance: 77.8 ml/min (by C-G formula based on Cr of 1.1).    Assessment: 58 y/o M on heparin for afib, was on Xarelto PTA (last dose 5/28), HL and aPTT are fairly close to correlating this AM, so will go ahead and use HL to dose, HL is 0.45, other labs as above.   Goal of Therapy:  Heparin level 0.3-0.7 units/ml Monitor platelets by anticoagulation protocol: Yes   Plan:  -Continue heparin at 1500 units/hr (previously therapeutic on this rate) -1000 HL to confirm -Daily CBC/HL -Monitor for bleeding  Narda Bonds 08/19/2013,3:59 AM

## 2013-08-20 LAB — HEPARIN LEVEL (UNFRACTIONATED): HEPARIN UNFRACTIONATED: 0.28 [IU]/mL — AB (ref 0.30–0.70)

## 2013-08-20 LAB — PROTIME-INR
INR: 1.22 (ref 0.00–1.49)
Prothrombin Time: 15.1 seconds (ref 11.6–15.2)

## 2013-08-20 LAB — CBC
HCT: 45.5 % (ref 39.0–52.0)
Hemoglobin: 15.8 g/dL (ref 13.0–17.0)
MCH: 32.7 pg (ref 26.0–34.0)
MCHC: 34.7 g/dL (ref 30.0–36.0)
MCV: 94.2 fL (ref 78.0–100.0)
PLATELETS: 225 10*3/uL (ref 150–400)
RBC: 4.83 MIL/uL (ref 4.22–5.81)
RDW: 14 % (ref 11.5–15.5)
WBC: 13 10*3/uL — ABNORMAL HIGH (ref 4.0–10.5)

## 2013-08-20 MED ORDER — HEPARIN (PORCINE) IN NACL 100-0.45 UNIT/ML-% IJ SOLN
1650.0000 [IU]/h | INTRAMUSCULAR | Status: DC
  Administered 2013-08-20 (×2): 1650 [IU]/h via INTRAVENOUS
  Filled 2013-08-20: qty 250

## 2013-08-20 MED ORDER — WARFARIN SODIUM 5 MG PO TABS
5.0000 mg | ORAL_TABLET | Freq: Once | ORAL | Status: DC
  Filled 2013-08-20: qty 1

## 2013-08-20 MED ORDER — AMIODARONE HCL 400 MG PO TABS: 400.0000 mg | ORAL_TABLET | Freq: Two times a day (BID) | ORAL | Status: DC

## 2013-08-20 MED ORDER — HEPARIN BOLUS VIA INFUSION
1000.0000 [IU] | Freq: Once | INTRAVENOUS | Status: AC
  Administered 2013-08-20: 1000 [IU] via INTRAVENOUS
  Filled 2013-08-20: qty 1000

## 2013-08-20 MED ORDER — ENOXAPARIN SODIUM 100 MG/ML ~~LOC~~ SOLN
1.0000 mg/kg | Freq: Once | SUBCUTANEOUS | Status: AC
  Administered 2013-08-20: 90 mg via SUBCUTANEOUS
  Filled 2013-08-20: qty 1

## 2013-08-20 NOTE — Discharge Instructions (Signed)
Atrial Fibrillation  Atrial fibrillation is a type of irregular heart rhythm (arrhythmia). During atrial fibrillation, the upper chambers of the heart (atria) quiver continuously in a chaotic pattern. This causes an irregular and often rapid heart rate.   Atrial fibrillation is the result of the heart becoming overloaded with disorganized signals that tell it to beat. These signals are normally released one at a time by a part of the right atrium called the sinoatrial node. They then travel from the atria to the lower chambers of the heart (ventricles), causing the atria and ventricles to contract and pump blood as they pass. In atrial fibrillation, parts of the atria outside of the sinoatrial node also release these signals. This results in two problems. First, the atria receive so many signals that they do not have time to fully contract. Second, the ventricles, which can only receive one signal at a time, beat irregularly and out of rhythm with the atria.   There are three types of atrial fibrillation:    Paroxysmal Paroxysmal atrial fibrillation starts suddenly and stops on its own within a week.    Persistent Persistent atrial fibrillation lasts for more than a week. It may stop on its own or with treatment.    Permanent Permanent atrial fibrillation does not go away. Episodes of atrial fibrillation may lead to permanent atrial fibrillation.   Atrial fibrillation can prevent your heart from pumping blood normally. It increases your risk of stroke and can lead to heart failure.   CAUSES    Heart conditions, including a heart attack, heart failure, coronary artery disease, and heart valve conditions.    Inflammation of the sac that surrounds the heart (pericarditis).    Blockage of an artery in the lungs (pulmonary embolism).    Pneumonia or other infections.    Chronic lung disease.    Thyroid problems, especially if the thyroid is overactive (hyperthyroidism).    Caffeine, excessive alcohol  use, and use of some illegal drugs.    Use of some medications, including certain decongestants and diet pills.    Heart surgery.    Birth defects.   Sometimes, no cause can be found. When this happens, the atrial fibrillation is called lone atrial fibrillation. The risk of complications from atrial fibrillation increases if you have lone atrial fibrillation and you are age 60 years or older.  RISK FACTORS   Heart failure.   Coronary artery disease   Diabetes mellitus.    High blood pressure (hypertension).    Obesity.    Other arrhythmias.    Increased age.  SYMPTOMS    A feeling that your heart is beating rapidly or irregularly.    A feeling of discomfort or pain in your chest.    Shortness of breath.    Sudden lightheadedness or weakness.    Getting tired easily when exercising.    Urinating more often than normal (mainly when atrial fibrillation first begins).   In paroxysmal atrial fibrillation, symptoms may start and suddenly stop.  DIAGNOSIS   Your caregiver may be able to detect atrial fibrillation when taking your pulse. Usually, testing is needed to diagnosis atrial fibrillation. Tests may include:    Electrocardiography. During this test, the electrical impulses of your heart are recorded while you are lying down.    Echocardiography. During echocardiography, sound waves are used to evaluate how blood flows through your heart.    Stress test. There is more than one type of stress test. If a stress test is   needed, ask your caregiver about which type is best for you.    Chest X-ray exam.    Blood tests.    Computed tomography (CT).   TREATMENT    Treating any underlying conditions. For example, if you have an overactive thyroid, treating the condition may correct atrial fibrillation.    Medication. Medications may be given to control a rapid heart rate or to prevent blood clots, heart failure, or a stroke.    Procedure to correct the rhythm of the  heart:   Electrical cardioversion. During electrical cardioversion, a controlled, low-energy shock is delivered to the heart through your skin. If you have chest pain, very low pressure blood pressure, or sudden heart failure, this procedure may need to be done as an emergency.   Catheter ablation. During this procedure, heart tissues that send the signals that cause atrial fibrillation are destroyed.   Maze or minimaze procedure. During this surgery, thin lines of heart tissue that carry the abnormal signals are destroyed. The maze procedure is an open-heart surgery. The minimaze procedure is a minimally invasive surgery. This means that small cuts are made to access the heart instead of a large opening.   Pulmonary venous isolation. During this surgery, tissue around the veins that carry blood from the lungs (pulmonary veins) is destroyed. This tissue is thought to carry the abnormal signals.  HOME CARE INSTRUCTIONS    Take medications as directed by your caregiver.   Only take medications that your caregiver approves. Some medications can make atrial fibrillation worse or recur.   If blood thinners were prescribed by your caregiver, take them exactly as directed. Too much can cause bleeding. Too little and you will not have the needed protection against stroke and other problems.   Perform blood tests at home if directed by your caregiver.   Perform blood tests exactly as directed.    Quit smoking if you smoke.    Do not drink alcohol.    Do not drink caffeinated beverages such as coffee, soda, and some teas. You may drink decaffeinated coffee, soda, or tea.    Maintain a healthy weight. Do not use diet pills unless your caregiver approves. They may make heart problems worse.    Follow diet instructions as directed by your caregiver.    Exercise regularly as directed by your caregiver.    Keep all follow-up appointments.  PREVENTION   The following substances can cause atrial fibrillation  to recur:    Caffeinated beverages.    Alcohol.    Certain medications, especially those used for breathing problems.    Certain herbs and herbal medications, such as those containing ephedra or ginseng.   Illegal drugs such as cocaine and amphetamines.  Sometimes medications are given to prevent atrial fibrillation from recurring. Proper treatment of any underlying condition is also important in helping prevent recurrence.   SEEK MEDICAL CARE IF:   You notice a change in the rate, rhythm, or strength of your heartbeat.    You suddenly begin urinating more frequently.    You tire more easily when exerting yourself or exercising.   SEEK IMMEDIATE MEDICAL CARE IF:    You develop chest pain, abdominal pain, sweating, or weakness.   You feel sick to your stomach (nauseous).   You develop shortness of breath.   You suddenly develop swollen feet and ankles.   You feel dizzy.   You face or limbs feel numb or weak.   There is a change in your   vision or speech.  MAKE SURE YOU:    Understand these instructions.   Will watch your condition.   Will get help right away if you are not doing well or get worse.  Document Released: 03/09/2005 Document Revised: 07/04/2012 Document Reviewed: 04/19/2012  [employer] Patient Information 2014 [employer].

## 2013-08-20 NOTE — Progress Notes (Deleted)
Discharge instructions reviewed with patient.  Was informed of upcoming appointments and understands reason for hospitalization, treatment regimen, and medication.  Activity and diet reviewed.  Patient had excellent understanding of medications.  Teach back method utilized.  Discharged to private residence accompanied by family member.  Patient ambulated to exit as was his preference.

## 2013-08-20 NOTE — Progress Notes (Addendum)
ANTICOAGULATION CONSULT NOTE - Follow-up Consult  Pharmacy Consult for warfarin, heparin Indication: atrial fibrillation  Allergies  Allergen Reactions  . Pravastatin Other (See Comments)    Myalgias     Patient Measurements: Height: 5' 8.11" (173 cm) Weight: 197 lb 1.5 oz (89.4 kg) IBW/kg (Calculated) : 68.65 Heparin dosing weight = 86 kg  Vital Signs: Temp: 98.4 F (36.9 C) (05/31 0353) Temp src: Oral (05/31 0353) BP: 164/93 mmHg (05/31 0353) Pulse Rate: 59 (05/31 0353)  Labs:  Recent Labs  08/18/13 1334 08/19/13 0230 08/19/13 0315 08/19/13 0955 08/20/13 0355  HGB 19.3*  --  16.8  --  15.8  HCT 54.7*  --  47.9  --  45.5  PLT 336  --  259  --  225  APTT  --  62*  --   --   --   LABPROT 15.0  --  13.1  --  15.1  INR 1.21  --  1.01  --  1.22  HEPARINUNFRC  --  0.45  --  0.40 0.28*  CREATININE 1.16  --  1.10  --   --   TROPONINI <0.30  --   --   --   --     Estimated Creatinine Clearance: 77.8 ml/min (by C-G formula based on Cr of 1.1).  Assessment: 58 yo male on IV heparin infusion and coumadin for afib.  Heparin level dropped to 0.28 this AM, slightly below goal 0.3-0.7.  RN reports no problems with IV heparin infusion during the night.  INR today = 1.22 after 2 days of coumadin.  CBC stable and no bleeding noted.  Hx: He presented to the ED after being found to be in afib at MD's office. He was starting to be converted from xarelto to coumadin. He started coumadin 5/28 and was to stop xarelto 5/29 however last Xarelto dose taken on 5/28. INR down to 1.01 past 2 doses of coumadin. Pt with new amio start which may potentiate effects of warfarin.  Goal of Therapy:  INR 2-3 Heparin level 0.3-0.7 units/ml aPTT 66-102 seconds Monitor platelets by anticoagulation protocol: Yes   Plan:  1. Bolus with IV heparin 1000 units x1 and increase Heparin      drip rate to 1650 units/hr. 2. Check heparin level 6 after bolus/rate change. 3. Warfarin 5mg  tonight  4. Daily  HL, aPTT, CBC, INR   Nicole Cella, RPh Clinical Pharmacist Pager: [phone] 08/20/2013,7:39 AM

## 2013-08-20 NOTE — Discharge Summary (Signed)
Discharge Summary   Patient ID: Kevin English MRN: [id number], DOB/AGE: January 26, 1956 59 y.o. Admit date: 08/18/2013 D/C date:     08/20/2013  Primary Cardiologist: Dr. [name]   Principal Problem:   Atrial fibrillation with RVR Active Problems:   Hyperlipidemia   HTN (hypertension)   Obesity   Sleep apnea   Rheumatic heart disease   Mitral stenosis   Acute on chronic diastolic CHF (congestive heart failure), NYHA class 3   Hypothyroid   Discharge Diagnosis: Atrial fibrillation with RVR   HPI: Kevin English is a 58 y.o. male with a history of HTN, hyperlipidemia, hypothyroidism, tobacco abuse, RHD mitral stenosis, chronic diastolic CHFand PAF on xarelto who was seen by Dr. Harrington Challenger, the DOD in the office, for atrial fibrillation with RVR and was sent to [facility] for admission and further management.   Hospital Course   Atrial fib with an RVR - he was admitted and placed on amiodarone bolus and infusion. He converted to NSR early AM on 08/19/13 and has maintained this. He was converted to PO amio 400mg  BID. His Xarelto was discontinued and he was placed on Warfarin in the setting of valvular heart disease. He was bridged with heparin and received one dose of Lovenox at discharge. He has an INR appointment tomorrow and if he is subtheraputic at that time he will receive another dose of Lovenox. INR 1.22 today.  RHD related mitral stenosis- Disussion regarding mitral valve repair/replacement/MAZE vs closed commisurotomy vs valvuloplasty will be ongoing. Dr. Lovena Le discussed the pro's and con's of this approach with this patient and his wife. Dr Derinda Sis at [facility] was contacted and echo was sent for review for consideration of mitral annuloplasty. Dr. Roxy Manns has also consulted and is following along.  Acute diastolic heart failure secondary to MS and atrial fibrillation. He was continued on Lasix 40mg  qd. His volume status has remained stable.   Leukocytosis- Patient noted to have a white blood cell  count of 22,000. No infectious etiology found and white count improving. 13K today  The patient has had an uncomplicated hospital course and is recovering well. He has been seen by Dr. Lovena Le today and deemed ready for discharge home. All follow-up appointments have been scheduled.  Discharge medications are listed below. He was placed on amiodarone 400mg  BID and bridged to coumadin with heparin. He received a shot of lovenox before discharge. He will need to return for INR check in our office tomorrow. If he is subtherapeutic, will give additional lovenox in our office. His Xarelto has been discontinued.   Discharge Vitals: Blood pressure 164/93, pulse 59, temperature 98.4 F (36.9 C), temperature source Oral, resp. rate 20, height 5' 8.11" (1.73 m), weight 197 lb 1.5 oz (89.4 kg), SpO2 100.00%.  Labs: Lab Results  Component Value Date   WBC 13.0* 08/20/2013   HGB 15.8 08/20/2013   HCT 45.5 08/20/2013   MCV 94.2 08/20/2013   PLT 225 08/20/2013     Recent Labs Lab 08/19/13 0315  NA 137  K 4.2  CL 105  CO2 19  BUN 12  CREATININE 1.10  CALCIUM 9.3  GLUCOSE 138*    Recent Labs  08/18/13 1334  TROPONINI <0.30     Diagnostic Studies/Procedures   Dg Chest Port 1 View  08/18/2013   CLINICAL DATA:  Chest pain.  Hypertension.  EXAM: PORTABLE CHEST - 1 VIEW  COMPARISON:  07/2013  FINDINGS: The heart size and mediastinal contours are within normal limits. Both lungs  are clear. The visualized skeletal structures are unremarkable.  IMPRESSION: No active disease.   Electronically Signed   By: Earle Gell M.D.   On: 08/18/2013 14:27   Dg Chest Port 1 View  08/09/2013   CLINICAL DATA:  TACHYCARDIA  EXAM: PORTABLE CHEST - 1 VIEW  COMPARISON:  DG CHEST 2V dated 05/20/2009  FINDINGS: Mild enlargement of the cardiac silhouette. Engorgement of the central pulmonary vascularity. Increased interstitial markings consistent with interstitial edema. No pleural effusion or alveolar infiltrate. No acute  bony thoracic abnormality.  IMPRESSION: Congestive heart failure with mild pulmonary interstitial edema.   Electronically Signed   By: David  Martinique   On: 08/09/2013 13:32    Discharge Medications     Medication List    STOP taking these medications       XARELTO 20 MG Tabs tablet  Generic drug:  rivaroxaban      TAKE these medications       amiodarone 400 MG tablet  Commonly known as:  PACERONE  Take 1 tablet (400 mg total) by mouth 2 (two) times daily.     diltiazem 120 MG 24 hr capsule  Commonly known as:  CARDIZEM CD  Take 1 capsule (120 mg total) by mouth daily.     furosemide 40 MG tablet  Commonly known as:  LASIX  Take 1 tablet (40 mg total) by mouth daily.     levothyroxine 50 MCG tablet  Commonly known as:  SYNTHROID, LEVOTHROID  Take 50 mcg by mouth daily.     potassium chloride SA 20 MEQ tablet  Commonly known as:  K-DUR,KLOR-CON  Take 1 tablet (20 mEq total) by mouth daily.     warfarin 5 MG tablet  Commonly known as:  COUMADIN  Take 1 tablet (5 mg total) by mouth daily.        Disposition   The patient will be discharged in stable condition to home.  Follow-up Information   Follow up with [facility] On 08/21/2013. (To check INR)    Contact information:   [address] [address]       Follow up with Dorothy Spark, MD On 08/30/2013. (@ 2pm)    Specialty:  Cardiology   Contact information:   [address] [place] [id number] [phone]         Duration of Discharge Encounter: Greater than 30 minutes including physician and PA time.  Signed, Perry Mount PA-C 08/20/2013, 11:13 AM

## 2013-08-20 NOTE — Progress Notes (Signed)
Patient ID: AUREN VALDES, male   DOB: 06-11-1955, 58 y.o.   MRN: [id number]   Patient Name: Kevin English Date of Encounter: 08/20/2013     Active Problems:   Atrial fibrillation with RVR    SUBJECTIVE  "I want to go home" denies sob  CURRENT MEDS . amiodarone  400 mg Oral BID  . diltiazem  120 mg Oral Daily  . furosemide  40 mg Oral Daily  . levothyroxine  50 mcg Oral QAC breakfast  . potassium chloride SA  20 mEq Oral Daily  . warfarin  5 mg Oral ONCE-1800  . Warfarin - Pharmacist Dosing Inpatient   Does not apply q1800    OBJECTIVE  Filed Vitals:   08/19/13 0456 08/19/13 1500 08/19/13 2022 08/20/13 0353  BP: 126/83 129/82 145/78 164/93  Pulse: 68 69 64 59  Temp: 97.9 F (36.6 C) 98.1 F (36.7 C) 98.4 F (36.9 C) 98.4 F (36.9 C)  TempSrc: Oral Oral Oral Oral  Resp: 19 20 21 20   Height:      Weight:      SpO2: 99% 99% 99% 100%    Intake/Output Summary (Last 24 hours) at 08/20/13 0930 Last data filed at 08/20/13 0600  Gross per 24 hour  Intake  800.4 ml  Output    650 ml  Net  150.4 ml   Filed Weights   08/18/13 2011  Weight: 197 lb 1.5 oz (89.4 kg)    PHYSICAL EXAM  General: Pleasant, middle aged man, NAD. Neuro: Alert and oriented X 3. Moves all extremities spontaneously HEENT:  Normal  Neck: Supple without bruits or JVD. Lungs:  Resp regular and unlabored, CTA. Heart: RRR no s3, s4, or murmurs. Abdomen: Soft, non-tender, non-distended, BS + x 4.  Extremities: No clubbing, cyanosis or edema. DP/PT/Radials 2+ and equal bilaterally.  Accessory Clinical Findings  CBC  Recent Labs  08/18/13 1334 08/19/13 0315 08/20/13 0355  WBC 22.0* 16.8* 13.0*  NEUTROABS 12.5*  --   --   HGB 19.3* 16.8 15.8  HCT 54.7* 47.9 45.5  MCV 93.2 93.4 94.2  PLT 336 259 025   Basic Metabolic Panel  Recent Labs  08/18/13 1334 08/19/13 0315  NA 140 137  K 4.4 4.2  CL 104 105  CO2 20 19  GLUCOSE 130* 138*  BUN 13 12  CREATININE 1.16 1.10  CALCIUM  10.7* 9.3  MG  --  2.1   Liver Function Tests No results found for this basename: AST, ALT, ALKPHOS, BILITOT, PROT, ALBUMIN,  in the last 72 hours No results found for this basename: LIPASE, AMYLASE,  in the last 72 hours Cardiac Enzymes  Recent Labs  08/18/13 1334  TROPONINI <0.30   BNP No components found with this basename: POCBNP,  D-Dimer No results found for this basename: DDIMER,  in the last 72 hours Hemoglobin A1C No results found for this basename: HGBA1C,  in the last 72 hours Fasting Lipid Panel No results found for this basename: CHOL, HDL, LDLCALC, TRIG, CHOLHDL, LDLDIRECT,  in the last 72 hours Thyroid Function Tests No results found for this basename: TSH, T4TOTAL, FREET3, T3FREE, THYROIDAB,  in the last 72 hours  TELE nsr   Radiology/Studies  Dg Chest Port 1 View  08/18/2013   CLINICAL DATA:  Chest pain.  Hypertension.  EXAM: PORTABLE CHEST - 1 VIEW  COMPARISON:  07/2013  FINDINGS: The heart size and mediastinal contours are within normal limits. Both lungs are clear. The visualized skeletal structures  are unremarkable.  IMPRESSION: No active disease.   Electronically Signed   By: Earle Gell M.D.   On: 08/18/2013 14:27   Dg Chest Port 1 View  08/09/2013   CLINICAL DATA:  TACHYCARDIA  EXAM: PORTABLE CHEST - 1 VIEW  COMPARISON:  DG CHEST 2V dated 05/20/2009  FINDINGS: Mild enlargement of the cardiac silhouette. Engorgement of the central pulmonary vascularity. Increased interstitial markings consistent with interstitial edema. No pleural effusion or alveolar infiltrate. No acute bony thoracic abnormality.  IMPRESSION: Congestive heart failure with mild pulmonary interstitial edema.   Electronically Signed   By: David  Martinique   On: 08/09/2013 13:32    ASSESSMENT AND PLAN  1. Atrial fib with an RVR 2. Mitral stenosis 3. Acute diastolic heart failure secondary to #1 and #2 Rec: ok to discharge home today. He has maintained NSR. Will continue oral amio 400 bid,  and give him a shot of lovenox before leaving. He will need to return for INR in our office tomorrow. If he is subtherapeutic, will give additional lovenox in our office. Disussion regarding mitral valve repair/replacement/MAZE vs closed commisurotomy vs valvuloplasty will be ongoing. I have discussed the pro's and con's of this approach with this patient and his wife. Aisa Schoeppner,M.D.  08/20/2013 9:30 AM

## 2013-08-21 ENCOUNTER — Ambulatory Visit (INDEPENDENT_AMBULATORY_CARE_PROVIDER_SITE_OTHER): Payer: Managed Care, Other (non HMO) | Admitting: *Deleted

## 2013-08-21 DIAGNOSIS — Z0000 Encounter for general adult medical examination without abnormal findings: Secondary | ICD-10-CM | POA: Insufficient documentation

## 2013-08-21 DIAGNOSIS — I5033 Acute on chronic diastolic (congestive) heart failure: Secondary | ICD-10-CM

## 2013-08-21 DIAGNOSIS — Z5181 Encounter for therapeutic drug level monitoring: Secondary | ICD-10-CM

## 2013-08-21 DIAGNOSIS — Z7901 Long term (current) use of anticoagulants: Secondary | ICD-10-CM | POA: Insufficient documentation

## 2013-08-21 DIAGNOSIS — I509 Heart failure, unspecified: Secondary | ICD-10-CM

## 2013-08-21 DIAGNOSIS — I4891 Unspecified atrial fibrillation: Secondary | ICD-10-CM

## 2013-08-21 LAB — POCT INR: INR: 1.9

## 2013-08-21 NOTE — Patient Instructions (Signed)

## 2013-08-22 ENCOUNTER — Telehealth: Payer: Self-pay | Admitting: Cardiology

## 2013-08-22 NOTE — Telephone Encounter (Signed)
LMTCB

## 2013-08-22 NOTE — Telephone Encounter (Signed)
Patient has a couple of questions and would like to speak with you only. Please call and advise.

## 2013-08-23 NOTE — Telephone Encounter (Signed)
lmtcb on both numbers listed

## 2013-08-24 ENCOUNTER — Encounter: Payer: Self-pay | Admitting: Thoracic Surgery (Cardiothoracic Vascular Surgery)

## 2013-08-24 ENCOUNTER — Other Ambulatory Visit: Payer: Self-pay | Admitting: *Deleted

## 2013-08-24 ENCOUNTER — Institutional Professional Consult (permissible substitution) (INDEPENDENT_AMBULATORY_CARE_PROVIDER_SITE_OTHER): Payer: Managed Care, Other (non HMO) | Admitting: Thoracic Surgery (Cardiothoracic Vascular Surgery)

## 2013-08-24 ENCOUNTER — Encounter: Payer: Self-pay | Admitting: *Deleted

## 2013-08-24 VITALS — BP 150/82 | HR 52 | Resp 16 | Ht 68.0 in | Wt 198.0 lb

## 2013-08-24 DIAGNOSIS — I480 Paroxysmal atrial fibrillation: Secondary | ICD-10-CM

## 2013-08-24 DIAGNOSIS — I4891 Unspecified atrial fibrillation: Secondary | ICD-10-CM

## 2013-08-24 DIAGNOSIS — I509 Heart failure, unspecified: Secondary | ICD-10-CM

## 2013-08-24 DIAGNOSIS — I7100 Dissection of unspecified site of aorta: Secondary | ICD-10-CM

## 2013-08-24 DIAGNOSIS — I099 Rheumatic heart disease, unspecified: Secondary | ICD-10-CM

## 2013-08-24 DIAGNOSIS — I050 Rheumatic mitral stenosis: Secondary | ICD-10-CM

## 2013-08-24 NOTE — Telephone Encounter (Signed)
follow up     Wife stated you do not have to call her back . Dr. [name] called patient

## 2013-08-24 NOTE — Telephone Encounter (Signed)
Message copied by Nuala Alpha on Thu Aug 24, 2013  4:59 PM ------      Message from: Dorothy Spark      Created: Thu Aug 24, 2013  4:12 PM       Karlene Einstein,            Could you schedule Mr Kevin English for a left cardiac cath on Monday? The diagnosis is mitral stenosis, he will go for valve surgery on Wednesday.             Thank you,      Houston Siren ------

## 2013-08-24 NOTE — Patient Instructions (Signed)
Stop taking coumadin  Begin taking lovenox injections this weekend as instructed by Dr. [name] in anticipation of heart cath on Monday

## 2013-08-24 NOTE — Progress Notes (Signed)
[address]       [place] [address]             [phone]     CARDIOTHORACIC SURGERY OFFICE NOTE  Referring Provider is Dorothy Spark, MD PCP is Gilford Rile, MD   HPI:  The patient returns for followup of rheumatic mitral valve disease with stage D severe symptomatic mitral stenosis with recurrent paroxysmal atrial fibrillation.  His primary complaints have been that of chronic fatigue, but recently he has been hospitalized on several occasions with episodes of recurrent paroxysmal fibrillation associated with acute exacerbation of chronic diastolic congestive heart failure.  I had the opportunity to see the patient in consultation during one of these hospitalizations on 08/11/2013.  Since then the patient underwent transesophageal echocardiogram by Dr. [name]. This confirmed the presence of rheumatic mitral disease with moderate to severe mitral stenosis and moderate left atrial enlargement. Since then the patient was readmitted to the hospital again with acute exacerbation of chronic diastolic congestive heart failure in the setting of rapid atrial fibrillation. He converted back to sinus rhythm on amiodarone. After discussing options further with Dr. [name] and Dr. Lovena Le, the patient returns to the office today to discuss possible definitive surgical intervention. He reports no new problems or complaints of the past week.   Current Outpatient Prescriptions  Medication Sig Dispense Refill  . amiodarone (PACERONE) 400 MG tablet Take 1 tablet (400 mg total) by mouth 2 (two) times daily.  120 tablet  3  . diltiazem (CARDIZEM CD) 120 MG 24 hr capsule Take 1 capsule (120 mg total) by mouth daily.  30 capsule  11  . furosemide (LASIX) 40 MG tablet Take 1 tablet (40 mg total) by mouth daily.  30 tablet  11  . levothyroxine (SYNTHROID, LEVOTHROID) 50 MCG tablet Take 50 mcg by mouth daily.       . potassium chloride SA (K-DUR,KLOR-CON) 20 MEQ tablet Take 1 tablet  (20 mEq total) by mouth daily.  30 tablet  11  . warfarin (COUMADIN) 5 MG tablet Take 1 tablet (5 mg total) by mouth daily.  30 tablet  5   No current facility-administered medications for this visit.      Physical Exam:   BP 150/82  Pulse 52  Resp 16  Ht 5\' 8"  (1.727 m)  Wt 198 lb (89.812 kg)  BMI 30.11 kg/m2  SpO2 97%  General:  Well-appearing  Chest:   Clear  CV:   Regular rate and rhythm without murmur  Incisions:  n/a  Abdomen:  Soft nontender  Extremities:  Warm and well-perfused with no lower extremity edema  Diagnostic Tests:  Transesophageal Echocardiogram Note   MARGUERITE JARBOE  [id number]  [birth year]  Procedure: Transesophageal Echocardiogram  Indications: Rheumatic heart disease, mitral stenosis  Procedure Details  Consent: Obtained  Time Out: Verified patient identification, verified procedure, site/side was marked, verified correct patient position, special equipment/implants available, Radiology Safety Procedures followed, medications/allergies/relevent history reviewed, required imaging and test results available. Performed  Medications:  Fentanyl: 75 mcg  Versed: 4 mg  Left Ventrical: The cavity size was normal with normal LVEF 55-60%, no WMA, no LVH.  Mitral Valve: Hockey stick appearance of the mitral valve, predominantly of the anterior leaflet that is thickened and mildly calcified. Restricted opening. Peak trans mitral gradient 15 mmHg, mean gradient 7 mmHg. Mobility is restricted predominantly of the anterior leaflet. Diastolic leaflet doming was present.  Moderate mitral stenosis, trace mitral regurgitation.  Mitral  annuloplasty score: 7  Aortic Valve: Mild aortic insufficiency.  Tricuspid Valve: Trace TR.  Pulmonic Valve: Grossly normal.  Left Atrium/ Left atrial appendage: Left atrium is moderately dilated. No thrombus, normal filling and emptying velocities.  Atrial septum: Intact, no ASD or PFO by color Doppler.  Aorta: Minimal AS plague.    Complications: No apparent complications  Patient did tolerate procedure well.  Dorothy Spark, MD, [facility]  08/16/2013, 9:27 AM   Patient: Atanacio, Melnyk MR #: [id number] Study Date: 08/16/2013 Gender: M Age: 58 Height: 172.7 cm Weight: 89.5 kg BSA: 2.1 m^2 Pt. Status: Room:  Tonia Ghent, Doreene Burke REFERRING Erlene Quan SONOGRAPHER Joanie Coddington, RDCS ADMITTING Ena Dawley, M.D. ATTENDING Ena Dawley, M.D. PERFORMING Ena Dawley, M.D.  cc:  -------------------------------------------------------------------  ------------------------------------------------------------------- Indications: 424.0 Mitral valve disease. 786.05 Dyspnea.  ------------------------------------------------------------------- Study Conclusions  - Left ventricle: The cavity size was normal. Wall thickness was normal. Systolic function was normal. Wall motion was normal; there were no regional wall motion abnormalities. - Aortic valve: There was mild regurgitation. - Mitral valve: Valve area by pressure half-time: 1.4 cm^2. - Left atrium: The atrium was moderately dilated. No evidence of thrombus in the atrial cavity or appendage. No evidence of thrombus in the atrial cavity or appendage. - Right atrium: No evidence of thrombus in the atrial cavity or appendage.  Impressions:  - Rheumatic mitral valve with moderate leaflet tip thickening and minimal calcifications. Moderate mitral stenosis and trace mitral egurgitation.  The anatomy seems to be favorable for a transcutaneous balloon valvuloplasty (Score 6).  -------------------------------------------------------------------  ------------------------------------------------------------------- Left ventricle: The cavity size was normal. Wall thickness was normal. Systolic function was normal. Wall motion was normal; there were no regional wall motion  abnormalities.  ------------------------------------------------------------------- Aortic valve: Structurally normal valve. Trileaflet; normal thickness leaflets. Cusp separation was normal. Doppler: There was mild regurgitation.  ------------------------------------------------------------------- Aorta: There was no atheroma. There was no evidence for dissection. Aortic root: The aortic root was not dilated. Ascending aorta: The ascending aorta was normal in size. Aortic arch: The aortic arch was normal in size. Descending aorta: The descending aorta was normal in size.  ------------------------------------------------------------------- Mitral valve: There is moderate thickening and minimal calcification of the mitral valve leaflets consistent with rheumatic disease. Thickenning is affecting the leaflets tips sparing the rest of the leaflets. Anterior leaflet has typical hockey stick appearance and has moderately restricted mobility. Anterior leaflet is doming into the left atrium in systole. The findings are consistent with moderate stenosis. Mean transmitral gradient is 7 mmHg. Valve area by pressure halftime: 1.4 cm2.  There is only trace mitral regurgitation. Structurally normal valve. Leaflet separation was normal. Doppler: There was no significant regurgitation. Valve area by pressure half-time: 1.4 cm^2. Indexed valve area by pressure half-time: 0.73 cm^2/m^2. Mean gradient (D): 7 mm Hg. Peak gradient (D): 9 mm Hg.  ------------------------------------------------------------------- Left atrium: The atrium was moderately dilated. No evidence of thrombus in the atrial cavity or appendage. No evidence of thrombus in the atrial cavity or appendage. The appendage was morphologically a left appendage, multilobulated, and of normal size. Emptying velocity was normal.  ------------------------------------------------------------------- Right ventricle: The cavity size was  normal. Wall thickness was normal. Systolic function was normal.  ------------------------------------------------------------------- Pulmonic valve: Structurally normal valve.  ------------------------------------------------------------------- Tricuspid valve: Structurally normal valve. Leaflet separation was normal. Doppler: There was mild regurgitation.  ------------------------------------------------------------------- Pulmonary artery: The main pulmonary artery was normal-sized.  ------------------------------------------------------------------- Right atrium: The atrium was normal in size. No evidence of thrombus in the  atrial cavity or appendage. The appendage was morphologically a right appendage.  ------------------------------------------------------------------- Pericardium: There was no pericardial effusion.  ------------------------------------------------------------------- Prepared and Electronically Authenticated by  Ena Dawley, M.D. 2015-05-28T10:41:20  ------------------------------------------------------------------- Measurements  Mitral valve Value 07/20/2013 Reference Mitral E-wave peak 146.67 cm/s 147 --------- velocity Mitral mean 124 cm/s 115 --------- velocity, D Mitral deceleration 297.12 cm/s^2 ---------- --------- slope Mitral deceleration (H) 494 ms 662 150 - 230 time Mitral pressure 143 ms 175 --------- half-time Mitral mean 7 mm Hg 7 --------- gradient, D Mitral peak 9 mm Hg 16 --------- gradient, D Mitral valve area, 1.54 cm^2 1.26 --------- PHT, DP Mitral valve 0.73 cm^2/m^2 0.62 --------- area/bsa, PHT, DP Mitral annulus VTI, 60.3 cm 69.3 --------- D  Pulmonary arteries Value 07/20/2013 Reference PA pressure, S, DP (N) 30 mm Hg ---------- <=30  Legend: (L) and (H) mark values outside specified reference range.  (N) marks values inside specified reference range.    Impression:  The patient has rheumatic mitral  valve disease with stage D severe symptomatic mitral stenosis associated with chronic diastolic congestive heart failure and recurrent paroxysmal atrial fibrillation. Transesophageal echocardiogram demonstrates the presence of at least moderate mitral stenosis.  Anatomical findings appear relatively favorable for possible balloon mitral valvuloplasty. However, the patient's problems with recurrent paroxysmal atrial fibrillation seem to be driving much of his recent symptom progression, and there remains a significant likelihood that atrial fibrillation may persist despite successful balloon valvuloplasty.  Alternatively, mitral valve repair or replacement with Maze procedure offers the potential to deal with both problems more definitively.  Based upon review of the patient's transesophageal echocardiogram, there is a possibility that his valve may be repairable with either simple commissuroplasty or commissuroplasty with patch augmentation of one or both leaflets. The primary disadvantage of valve repair is the potential for progression of disease with the possibility of developing recurrent mitral stenosis and/or mitral regurgitation in the future. Valve replacement using a mechanical prosthesis would provide a more durable result but come with the need for long-term anticoagulation and the associated risk of stroke or other valve related complications.    Plan:  I discussed options at length with the patient and his wife in the office this afternoon.  The patient does not wish to try balloon mitral valvuloplasty.  He desires to proceed with surgery as soon as practical. He will need left and right heart catheterization prior to surgery, and in the absence of significant coronary artery disease he may be a reasonably good candidate for minimally invasive approach. Weekend we plan to proceed with surgery on Wednesday, 08/30/2013.  I have instructed the patient to stop taking Coumadin in anticipation of  catheterization on Monday, 08/28/2013.  I've discussed matters over the telephone with Dr. [name] who plans to arrange for the patient to begin a Lovenox bridge over the weekend. All questions have been answered.    I spent in excess of 45 minutes during the conduct of this office consultation and >50% of this time involved direct face-to-face encounter with the patient for counseling and/or coordination of their care.  Valentina Gu. Roxy Manns, MD 08/24/2013 4:01 PM

## 2013-08-24 NOTE — Telephone Encounter (Signed)
Per Dr [name] this pt should be set up for a left cardiac on Monday 6/8 for mitral stenosis.  Pt is having valve surgery on Wednesday 6/10.  Contacted cath lab and spoke with Trish.  Pt is scheduled for left sided cath on Monday 6/8 at 12 pm with Dr Ellyn Hack at [facility]. Pt is to arrive at 10 am.  Per Dr [name] this pt does not need pre procedural labs d/t recent hospital stay 6/1.  Pt is coming to scheduled coumadin clinic appt for 6/5 to bridge pt from coumadin to Lovenox shots per Dr [name] and Dr Candis Musa.  Pt and wife both contacted about this plan and both agree with this.  Informed both of them that I will have a instruction letter to pick up tomorrow at coumadin clinic appt.  Wife and pt verbalized understanding.

## 2013-08-25 ENCOUNTER — Ambulatory Visit
Admission: RE | Admit: 2013-08-25 | Discharge: 2013-08-25 | Disposition: A | Discharge status: Home / Self Care | Payer: Managed Care, Other (non HMO) | Source: Ambulatory Visit | Attending: Thoracic Surgery (Cardiothoracic Vascular Surgery) | Admitting: Thoracic Surgery (Cardiothoracic Vascular Surgery)

## 2013-08-25 ENCOUNTER — Ambulatory Visit (INDEPENDENT_AMBULATORY_CARE_PROVIDER_SITE_OTHER): Payer: Managed Care, Other (non HMO) | Admitting: Pharmacist

## 2013-08-25 ENCOUNTER — Institutional Professional Consult (permissible substitution): Payer: Managed Care, Other (non HMO) | Admitting: Internal Medicine

## 2013-08-25 ENCOUNTER — Telehealth: Payer: Self-pay | Admitting: Cardiology

## 2013-08-25 DIAGNOSIS — I509 Heart failure, unspecified: Secondary | ICD-10-CM

## 2013-08-25 DIAGNOSIS — I7100 Dissection of unspecified site of aorta: Secondary | ICD-10-CM

## 2013-08-25 DIAGNOSIS — Z5181 Encounter for therapeutic drug level monitoring: Secondary | ICD-10-CM

## 2013-08-25 DIAGNOSIS — I4891 Unspecified atrial fibrillation: Secondary | ICD-10-CM

## 2013-08-25 DIAGNOSIS — I5033 Acute on chronic diastolic (congestive) heart failure: Secondary | ICD-10-CM

## 2013-08-25 LAB — POCT INR: INR: 2.1

## 2013-08-25 MED ORDER — ENOXAPARIN SODIUM 80 MG/0.8ML ~~LOC~~ SOLN: 80.0000 mg | Freq: Two times a day (BID) | SUBCUTANEOUS | Status: DC

## 2013-08-25 MED ORDER — IOHEXOL 350 MG/ML SOLN
80.0000 mL | Freq: Once | INTRAVENOUS | Status: AC | PRN
  Administered 2013-08-25: 80 mL via INTRAVENOUS

## 2013-08-25 NOTE — Telephone Encounter (Signed)
New message    CT done this afternoon, was told to call back to get test results.

## 2013-08-25 NOTE — Addendum Note (Signed)
Addended by: Dorothy Spark on: 08/25/2013 12:03 PM   Modules accepted: Orders

## 2013-08-28 ENCOUNTER — Encounter (HOSPITAL_COMMUNITY): Payer: Self-pay | Admitting: *Deleted

## 2013-08-28 ENCOUNTER — Inpatient Hospital Stay (HOSPITAL_COMMUNITY)
Admission: RE | Admit: 2013-08-28 | Discharge: 2013-08-29 | DRG: 287 | Disposition: A | Discharge status: Home / Self Care | Payer: Managed Care, Other (non HMO) | Source: Ambulatory Visit | Attending: Cardiology | Admitting: Cardiology

## 2013-08-28 ENCOUNTER — Other Ambulatory Visit: Payer: Self-pay | Admitting: *Deleted

## 2013-08-28 ENCOUNTER — Encounter (HOSPITAL_COMMUNITY)
Admission: RE | Disposition: A | Discharge status: Home / Self Care | Payer: Self-pay | Source: Home / Self Care | Attending: Cardiology

## 2013-08-28 DIAGNOSIS — Z860100 Personal history of colon polyps, unspecified: Secondary | ICD-10-CM

## 2013-08-28 DIAGNOSIS — F17200 Nicotine dependence, unspecified, uncomplicated: Secondary | ICD-10-CM | POA: Diagnosis present

## 2013-08-28 DIAGNOSIS — I4892 Unspecified atrial flutter: Secondary | ICD-10-CM | POA: Diagnosis present

## 2013-08-28 DIAGNOSIS — I059 Rheumatic mitral valve disease, unspecified: Secondary | ICD-10-CM

## 2013-08-28 DIAGNOSIS — I5033 Acute on chronic diastolic (congestive) heart failure: Secondary | ICD-10-CM | POA: Diagnosis present

## 2013-08-28 DIAGNOSIS — I4891 Unspecified atrial fibrillation: Secondary | ICD-10-CM | POA: Diagnosis present

## 2013-08-28 DIAGNOSIS — Z7901 Long term (current) use of anticoagulants: Secondary | ICD-10-CM

## 2013-08-28 DIAGNOSIS — N183 Chronic kidney disease, stage 3 unspecified: Secondary | ICD-10-CM | POA: Diagnosis present

## 2013-08-28 DIAGNOSIS — R0609 Other forms of dyspnea: Secondary | ICD-10-CM

## 2013-08-28 DIAGNOSIS — E039 Hypothyroidism, unspecified: Secondary | ICD-10-CM | POA: Diagnosis present

## 2013-08-28 DIAGNOSIS — I2510 Atherosclerotic heart disease of native coronary artery without angina pectoris: Secondary | ICD-10-CM | POA: Diagnosis present

## 2013-08-28 DIAGNOSIS — E559 Vitamin D deficiency, unspecified: Secondary | ICD-10-CM | POA: Diagnosis present

## 2013-08-28 DIAGNOSIS — I480 Paroxysmal atrial fibrillation: Secondary | ICD-10-CM | POA: Diagnosis present

## 2013-08-28 DIAGNOSIS — I129 Hypertensive chronic kidney disease with stage 1 through stage 4 chronic kidney disease, or unspecified chronic kidney disease: Secondary | ICD-10-CM | POA: Diagnosis present

## 2013-08-28 DIAGNOSIS — I10 Essential (primary) hypertension: Secondary | ICD-10-CM | POA: Diagnosis present

## 2013-08-28 DIAGNOSIS — E669 Obesity, unspecified: Secondary | ICD-10-CM | POA: Diagnosis present

## 2013-08-28 DIAGNOSIS — G4733 Obstructive sleep apnea (adult) (pediatric): Secondary | ICD-10-CM | POA: Diagnosis present

## 2013-08-28 DIAGNOSIS — I050 Rheumatic mitral stenosis: Principal | ICD-10-CM | POA: Diagnosis present

## 2013-08-28 DIAGNOSIS — E782 Mixed hyperlipidemia: Secondary | ICD-10-CM | POA: Diagnosis present

## 2013-08-28 DIAGNOSIS — E785 Hyperlipidemia, unspecified: Secondary | ICD-10-CM | POA: Diagnosis present

## 2013-08-28 DIAGNOSIS — I509 Heart failure, unspecified: Secondary | ICD-10-CM | POA: Diagnosis present

## 2013-08-28 DIAGNOSIS — N182 Chronic kidney disease, stage 2 (mild): Secondary | ICD-10-CM | POA: Diagnosis present

## 2013-08-28 DIAGNOSIS — I5032 Chronic diastolic (congestive) heart failure: Secondary | ICD-10-CM | POA: Diagnosis present

## 2013-08-28 DIAGNOSIS — N2889 Other specified disorders of kidney and ureter: Secondary | ICD-10-CM

## 2013-08-28 DIAGNOSIS — Z6830 Body mass index (BMI) 30.0-30.9, adult: Secondary | ICD-10-CM

## 2013-08-28 DIAGNOSIS — I099 Rheumatic heart disease, unspecified: Secondary | ICD-10-CM | POA: Diagnosis present

## 2013-08-28 HISTORY — DX: Chronic kidney disease, stage 2 (mild): N18.2

## 2013-08-28 HISTORY — PX: LEFT AND RIGHT HEART CATHETERIZATION WITH CORONARY ANGIOGRAM: SHX5449

## 2013-08-28 HISTORY — DX: Atherosclerotic heart disease of native coronary artery without angina pectoris: I25.10

## 2013-08-28 HISTORY — DX: Other specified disorders of kidney and ureter: N28.89

## 2013-08-28 LAB — POCT I-STAT 3, ART BLOOD GAS (G3+)
ACID-BASE DEFICIT: 16 mmol/L — AB (ref 0.0–2.0)
Bicarbonate: 13.9 mEq/L — ABNORMAL LOW (ref 20.0–24.0)
O2 SAT: 91 %
TCO2: 15 mmol/L (ref 0–100)
pCO2 arterial: 45.3 mmHg — ABNORMAL HIGH (ref 35.0–45.0)
pH, Arterial: 7.095 — CL (ref 7.350–7.450)
pO2, Arterial: 84 mmHg (ref 80.0–100.0)

## 2013-08-28 LAB — POCT ACTIVATED CLOTTING TIME: ACTIVATED CLOTTING TIME: 204 s

## 2013-08-28 LAB — POCT I-STAT 3, VENOUS BLOOD GAS (G3P V)
ACID-BASE EXCESS: 1 mmol/L (ref 0.0–2.0)
Bicarbonate: 26.7 mEq/L — ABNORMAL HIGH (ref 20.0–24.0)
O2 Saturation: 72 %
TCO2: 28 mmol/L (ref 0–100)
pCO2, Ven: 46.3 mmHg (ref 45.0–50.0)
pH, Ven: 7.369 — ABNORMAL HIGH (ref 7.250–7.300)
pO2, Ven: 40 mmHg (ref 30.0–45.0)

## 2013-08-28 LAB — PROTIME-INR
INR: 0.99 (ref 0.00–1.49)
Prothrombin Time: 12.9 seconds (ref 11.6–15.2)

## 2013-08-28 SURGERY — LEFT AND RIGHT HEART CATHETERIZATION WITH CORONARY ANGIOGRAM
Anesthesia: LOCAL

## 2013-08-28 MED ORDER — ASPIRIN 81 MG PO CHEW
81.0000 mg | CHEWABLE_TABLET | ORAL | Status: AC
  Administered 2013-08-28: 81 mg via ORAL
  Filled 2013-08-28: qty 1

## 2013-08-28 MED ORDER — SODIUM CHLORIDE 0.9 % IV SOLN
1.0000 mL/kg/h | INTRAVENOUS | Status: AC
  Administered 2013-08-28: 1 mL/kg/h via INTRAVENOUS

## 2013-08-28 MED ORDER — MORPHINE SULFATE 2 MG/ML IJ SOLN: 2.0000 mg | INTRAMUSCULAR | Status: DC | PRN

## 2013-08-28 MED ORDER — MIDAZOLAM HCL 2 MG/2ML IJ SOLN
INTRAMUSCULAR | Status: AC
  Filled 2013-08-28: qty 2

## 2013-08-28 MED ORDER — HEPARIN SODIUM (PORCINE) 1000 UNIT/ML IJ SOLN
INTRAMUSCULAR | Status: AC
  Filled 2013-08-28: qty 1

## 2013-08-28 MED ORDER — FUROSEMIDE 40 MG PO TABS
40.0000 mg | ORAL_TABLET | Freq: Every day | ORAL | Status: DC
  Administered 2013-08-28 – 2013-08-29 (×2): 40 mg via ORAL
  Filled 2013-08-28 (×2): qty 1

## 2013-08-28 MED ORDER — DILTIAZEM HCL ER COATED BEADS 120 MG PO CP24
120.0000 mg | ORAL_CAPSULE | Freq: Every day | ORAL | Status: DC
  Administered 2013-08-29: 120 mg via ORAL
  Filled 2013-08-28: qty 1

## 2013-08-28 MED ORDER — SODIUM CHLORIDE 0.9 % IV SOLN: 250.0000 mL | INTRAVENOUS | Status: DC | PRN

## 2013-08-28 MED ORDER — LEVOTHYROXINE SODIUM 50 MCG PO TABS
50.0000 ug | ORAL_TABLET | Freq: Every day | ORAL | Status: DC
  Administered 2013-08-29: 50 ug via ORAL
  Filled 2013-08-28 (×2): qty 1

## 2013-08-28 MED ORDER — SODIUM CHLORIDE 0.9 % IV SOLN
INTRAVENOUS | Status: DC
  Administered 2013-08-28: 11:00:00 via INTRAVENOUS

## 2013-08-28 MED ORDER — SODIUM CHLORIDE 0.9 % IV SOLN
250.0000 mL | INTRAVENOUS | Status: DC | PRN
Start: 2013-08-28 — End: 2013-08-28

## 2013-08-28 MED ORDER — AMIODARONE HCL 200 MG PO TABS
400.0000 mg | ORAL_TABLET | Freq: Two times a day (BID) | ORAL | Status: DC
  Administered 2013-08-28 – 2013-08-29 (×2): 400 mg via ORAL
  Filled 2013-08-28 (×3): qty 2

## 2013-08-28 MED ORDER — SODIUM CHLORIDE 0.9 % IJ SOLN: 3.0000 mL | INTRAMUSCULAR | Status: DC | PRN

## 2013-08-28 MED ORDER — VERAPAMIL HCL 2.5 MG/ML IV SOLN
INTRAVENOUS | Status: AC
Start: 2013-08-28 — End: 2013-08-28
  Filled 2013-08-28: qty 2

## 2013-08-28 MED ORDER — SODIUM CHLORIDE 0.9 % IJ SOLN
3.0000 mL | Freq: Two times a day (BID) | INTRAMUSCULAR | Status: DC
  Administered 2013-08-28: 3 mL via INTRAVENOUS

## 2013-08-28 MED ORDER — SODIUM CHLORIDE 0.9 % IJ SOLN
3.0000 mL | Freq: Two times a day (BID) | INTRAMUSCULAR | Status: DC
Start: 2013-08-28 — End: 2013-08-28

## 2013-08-28 MED ORDER — HEPARIN (PORCINE) IN NACL 2-0.9 UNIT/ML-% IJ SOLN
INTRAMUSCULAR | Status: AC
  Filled 2013-08-28: qty 1500

## 2013-08-28 MED ORDER — POTASSIUM CHLORIDE CRYS ER 20 MEQ PO TBCR
20.0000 meq | EXTENDED_RELEASE_TABLET | Freq: Every day | ORAL | Status: DC
  Administered 2013-08-28 – 2013-08-29 (×2): 20 meq via ORAL
  Filled 2013-08-28 (×2): qty 1

## 2013-08-28 MED ORDER — ENOXAPARIN SODIUM 100 MG/ML ~~LOC~~ SOLN
90.0000 mg | Freq: Two times a day (BID) | SUBCUTANEOUS | Status: DC
  Administered 2013-08-28 – 2013-08-29 (×2): 90 mg via SUBCUTANEOUS
  Filled 2013-08-28 (×3): qty 1

## 2013-08-28 MED ORDER — NITROGLYCERIN 0.2 MG/ML ON CALL CATH LAB
INTRAVENOUS | Status: AC
  Filled 2013-08-28: qty 1

## 2013-08-28 MED ORDER — LIDOCAINE HCL (PF) 1 % IJ SOLN
INTRAMUSCULAR | Status: AC
Start: 2013-08-28 — End: 2013-08-28
  Filled 2013-08-28: qty 30

## 2013-08-28 MED ORDER — FENTANYL CITRATE 0.05 MG/ML IJ SOLN
INTRAMUSCULAR | Status: AC
  Filled 2013-08-28: qty 2

## 2013-08-28 NOTE — Progress Notes (Signed)
Tr band removed per order and protocol, gauze and Tegaderm applied, pt instructed on wrist care and to alert nurse to any bleeding, pt stated understanding, will continue to monitor Rickard Rhymes, RN

## 2013-08-28 NOTE — Progress Notes (Addendum)
Patient ID: Kevin English, male   DOB: [birth year], 58 y.o.   MRN: [id number]  The patient will be admitted on Monday June 8 for a right and left cardiac cath as a part of preoperative evaluation for mitral stenosis requiring mitral valve surgery scheduled for 08/30/13.  Dorothy Spark 08/28/2013

## 2013-08-28 NOTE — CV Procedure (Signed)
CARDIAC CATHETERIZATION REPORT  NAME:  Kevin English   MRN: [id number] DOB:  [birth year]   ADMIT DATE: 08/28/2013 Procedure Date: 08/28/2013  INTERVENTIONAL CARDIOLOGIST: Leonie Man, M.D., MS PRIMARY CARE PROVIDER: Gilford Rile, MD PRIMARY CARDIOLOGIST: Dorris Carnes, MD & Ena Dawley, MD CT Surgeon: Dr. Roxy Manns  PATIENT:  Kevin English is a 58 y.o. male known rheumatic heart disease with mitral stenosis referred for right heart catheterization as part of evaluation for preop mitral valve replacement. His recent trouble with atrial fibrillation with heart failure therefore is being sent for valve replacement as opposed to balloon valvuloplasty.  PRE-OPERATIVE DIAGNOSIS:    Moderate-severe marked stenosis  Worsening heart failure symptoms  Atrial fibrillation, recurrent  PROCEDURES PERFORMED:    Right & Left Heart Catheterization with Native Coronary Angiography  via Right Radial Artery & Right Common Femoral Vein Access.  PROCEDURE: The patient was brought to the 2nd [facility] in the fasting state and prepped and draped in the usual sterile fashion for right radial and/or femoral arterial or radial access. A modified Mcgwire's test was performed on the right wrist demonstrating excellent collateral flow. Sterile technique was used including antiseptics, cap, gloves, gown, hand hygiene, mask and sheet. Skin prep: Chlorhexidine.   Consent: Risks of procedure as well as the alternatives and risks of each were explained to the (patient/caregiver). Consent for procedure obtained.   Time Out: Verified patient identification, verified procedure, site/side was marked, verified correct patient position, special equipment/implants available, medications/allergies/relevent history reviewed, required imaging and test results available. Performed.  Access:  Initial attempts were made to exchange the existing 18-gauge right antecubital IV over a wire for a 6 Fr  glide sheath were unsuccessful, the decision was made to abort the brachial approach and obtained femoral venous access. Right Common Femoral Vein: 7 Fr Sheath - Seldinger Technique.  Right Radial Artery: 6 Fr Sheath - modified Seldinger Technique   4500 Units IV Heparin; 10 mL Radial Cocktail  Right Heart Catheterization: 7Fr Swan Ganz catheter advanced under fluoroscopy with balloon inflated to the RA, RV, then PCWP-PA for hemodynamic measurement.  Simultaneous FA & PA blood gases checked for SaO2% to calculate FICK CO/CI  Thermodilution Injections performed to calculate CO/CI  Simultaneous PCWP/LV & RV/LV pressures monitored with Angled Pigtail in LV.  Catheter removed completely out of the body with balloon deflated.  Left Heart Catheterization: 5 Fr Catheters advanced or exchanged over a J-wire; TIG 4.0 catheter advanced first.  LV Hemodynamics: TIG 4.0 Left Coronary Artery Cineangiography: JL43.5Catheter  Right Coronary Artery: JR 4 Catheter   R Femoral Venous Sheath removed in the holding area with manual pressure for hemostasis.  TR Band: 15 mL @ 6286  FINDINGS:  Hemodynamics:  Findings:   SaO2%  Pressures mmHg  Mean P  mmHg  EDP  mmHg   Right Atrium    11/9   8   Right Ventricle    31/1    5   Pulmonary Artery   72   39.6   19    PCWP    17/13   13    Central Aortic   91   131/77   99    Left Ventricle    130/0    6          Cardiac Output:   Cardiac Index:    Fick   6.65    3.26    Thermodilution   3.97    1 .95  Left Ventriculography: Deferred  Coronary Anatomy: Left Dominant  Left Main: Large-caliber, dominant vessel that bifurcates into the LAD and Circumflex. Angiographically normal. Short vessel LAD: Large caliber vessel that gives off a very large proximal septal perforator trunk and a major diagonal branch. At this branch point is a roughly 40-50% stenosis. The vessel then continues distally to gives off a second diagonal branch. After the diagonal branch  the vessel then tapers to a very small-caliber vessel the apex. He barely reaches the apex. After D2 there appears to be a focal lesion although this was not very well visualized. Probably 50-60%.  D1: Moderate caliber proximal vessel it covers the anterolateral wall. Minimal luminal irregularities with several branches.  D2: Moderate caliber vessel, mid LAD, minimal luminal irregularities. At this point the vessel is actually larger than the follow on LAD. Left Circumflex: Very large caliber, dominant vessel. It gives rise to a very small marginal branch that runs like a ramus intermedius with diffuse disease including a proximal 70-80% stenosis. This is not amenable to PCI or CABG. There is an another large lateral OM before the vessel courses into the AV groove giving rise to 2 small posterior lateral branches and the posterior descending artery. Just after the posterior lateral branches going into the PDA there is a focal irregular 70% stenosis.  OM1: Large-caliber, lateral branch mild luminal irregularities. This vessel courses down to the inferolateral apex.  LPDA: Moderate large-caliber vessel with a proximal disease noted above. Beyond that has mild diffuse luminal irregularities of 10-20%. It reaches almost to the apex.   RCA: Small caliber, nondominant vessel. There is a separate ostium for the SA nodal artery to the main RCA has a early mid 30-40% stenosis that branches into 2 smaller marginal branches.   MEDICATIONS:  Anesthesia:  Local Lidocaine 2 mL for radial, 12 ml for femoral vein  Sedation:  3 mg IV Versed, 75 mcg IV fentanyl ;   Omnipaque Contrast: 80 ml  Anticoagulation:  IV Heparin 4500 Units ; Radial Cocktail: 5 mg Verapamil, 400 mcg NTG, 2 ml 2% Lidocaine in 10 ml NS  PATIENT DISPOSITION:    The patient was transferred to the PACU holding area in a hemodynamicaly stable, chest pain free condition.  The patient tolerated the procedure well, and there were no  complications.  EBL:   < 10 ml  The patient was stable before, during, and after the procedure.  POST-OPERATIVE DIAGNOSIS:    Known moderate stenosis. Gradient estimated of 5.8 mmHg by catheter  Conflicting cardiac outputs between Fick and thermodilution. Likely related to mitral valve disease  Moderate CAD with most significant being at his 70% distal circumflex-proximal LPDA Asian. Also tandem moderate lesions in the LAD of unclear significance, with a small downstream vessel.Marland Kitchen  PLAN OF CARE:  The patient will be admitted for planned inpatient valve surgery  I will review the films with Dr. Roxy Manns to determine whether or not he feels that CABG is warranted.  Continue home medications.   Leonie Man, M.D., M.S. [facility] [address] [place], [place]  [address]  [phone]  08/28/2013 3:00 PM

## 2013-08-28 NOTE — Consult Note (Signed)
Urology Consult  Requesting provider:  Dr. Ena Dawley.  CC: Right renal mass.  HPI: 58 year old male is admitted to the hospital for cardiac issues. He was admitted in late 07/2013 for A-fib w/ RVR. Discharged home and found to have mitral valve problems on TEE (specifically, he has severe, symptomatic mitral stenosis). Event monitor revealed further A-fib w/ RVR from which he was symptomatic. CTS evaluated patient and has recommended surgery.  I am consulted for a right renal mass.  Right renal mass. Incidental finding. Not associated w/ gross hematuria. Right mid-pole. Size: 3.2 cm. Mostly endophytic. Concerning for renal cell carcinoma, but it should be noted that this was only a contrasted image. Ideally, he would need a noncontrast and contrasted image. Review of his lab work reveals that he had a complete metabolic panel on QVZD/63/87 which was negative for elevated liver enzymes. He has some renal insufficiency with baseline estimated GFR around 60. A lateral renal cysts. Left angiomyolipoma. I reviewed the imaging and reports.  A standard management options for likely renal carcinoma. We discussed biopsy which I do not recommend as this can cause bleeding, resulting false negatives, and also resulted seeding of this tumor.other options include thermal ablation, but these did not have good long-term cure rate. The best option would be surgical removal via partial nephrectomy versus radical nephrectomy. Given the depth of the tumor and the location of believed that partial nephrectomy would be very difficult and would certainly increase his risk for bleeding. This also delayed ability to restart his blood thinner. We discussed the effect a radical nephrectomy would have on his renal function which would likely put him into chronic renal failure probably around stage III. We discussed surgical management which include but are not limited to bleeding, infection, which gradually, heart attack,  stroke, death, need for blood transfusion, injury to internal organs, and postoperative complications such as prolonged hospitalization, surgical wound infection, and hernia and renal failure down the line. We discussed specific risks and benefits of blood transfusion. Discussed approaches including open approach versus laparoscopic approach. I would recommend a laparoscopic approach. Is not having abdominal surgeries.  PMH: Past Medical History  Diagnosis Date  . Other abnormal glucose   . Benign neoplasm of colon   . Hernia   . Chronic rhinitis   . Diverticulitis of colon   . Hyperlipidemia   . HTN (hypertension)   . Hypogonadism male   . Hyperlipemia   . Leukocytosis   . Myalgia and myositis, unspecified   . Obesity   . Vitamin D deficiency   . OSA on CPAP   . Hypothyroidism   . Atrial fibrillation     sustained 160-180 on e-CARDIO monitor placed 07/29/2013/notes 08/09/2013  . Rheumatic heart disease   . Acute on chronic congestive heart failure   . Mitral stenosis 08/02/2013  . paroxysmal atrial fibrillation - recurrent, with RVR on admission 08/09/13     PSH: Past Surgical History  Procedure Laterality Date  . Inguinal hernia repair Bilateral     "I had one side done twice"  . Eye surgery Right 1990    "reconstructive"  . Tee without cardioversion N/A 08/16/2013    Procedure: TRANSESOPHAGEAL ECHOCARDIOGRAM (TEE);  Surgeon: Dorothy Spark, MD;  Location: [facility];  Service: Cardiovascular;  Laterality: N/A;    Allergies: Allergies  Allergen Reactions  . Pravastatin Other (See Comments)    Myalgias     Medications: Prescriptions prior to admission  Medication Sig Dispense Refill  .  amiodarone (PACERONE) 400 MG tablet Take 1 tablet (400 mg total) by mouth 2 (two) times daily.  120 tablet  3  . diltiazem (CARDIZEM CD) 120 MG 24 hr capsule Take 1 capsule (120 mg total) by mouth daily.  30 capsule  11  . enoxaparin (LOVENOX) 80 MG/0.8ML injection Inject 0.8 mLs (80  mg total) into the skin every 12 (twelve) hours.  4 Syringe  0  . furosemide (LASIX) 40 MG tablet Take 1 tablet (40 mg total) by mouth daily.  30 tablet  11  . levothyroxine (SYNTHROID, LEVOTHROID) 50 MCG tablet Take 50 mcg by mouth daily.       . potassium chloride SA (K-DUR,KLOR-CON) 20 MEQ tablet Take 1 tablet (20 mEq total) by mouth daily.  30 tablet  11  . warfarin (COUMADIN) 5 MG tablet Take 1 tablet (5 mg total) by mouth daily.  30 tablet  5     Social History: History   Social History  . Marital Status: Married    Spouse Name: N/A    Number of Children: N/A  . Years of Education: N/A   Occupational History  . Not on file.   Social History Main Topics  . Smoking status: Current Every Day Smoker -- 1.00 packs/day for 37 years    Types: Cigarettes  . Smokeless tobacco: Never Used  . Alcohol Use: No  . Drug Use: No  . Sexual Activity: Not Currently   Other Topics Concern  . Not on file   Social History Narrative  . No narrative on file    Family History: Family History  Problem Relation Age of Onset  . Hypertension    . Hypertension Mother     Review of Systems: Positive: DOE, SOB. Negative: Gross hematuria, flank pain, fever..  A further 10 point review of systems was negative except what is listed in the HPI.  Physical Exam: Filed Vitals:   08/28/13 1603  BP: 143/79  Pulse: 51  Temp: 97.9 F (36.6 C)  Resp: 18    General: No acute distress.  Awake. Head:  Normocephalic.  Atraumatic. ENT:  EOMI.  Mucous membranes moist Neck:  Supple.  No lymphadenopathy. CV:  S1 present. S2 present. Regular rate. Pulmonary: Equal effort bilaterally.  Clear to auscultation bilaterally. Abdomen: Soft.  Non- tender to palpation. Skin:  Normal turgor.  No visible rash. Extremity: No gross deformity of bilateral upper extremities.  No gross deformity of    bilateral lower extremities. Neurologic: Alert. Appropriate mood.    Studies:  No results found for this  basename: HGB, WBC, PLT,  in the last 72 hours  No results found for this basename: NA, K, CL, CO2, BUN, CREATININE, CALCIUM, MAGNESIUM, GFRNONAA, GFRAA,  in the last 72 hours   Recent Labs     08/28/13  1104  INR  0.99     No components found with this basename: ABG,   Discussion:  We discussed the natural history of renal masses. We discussed that this is likely a renal cancer. This would be difficult to remove with a partial nephrectomy. Certainly, he would be at increased risk for bleeding. I have discussed with his cardiologist, Dr. [name], who stated that he would need to be on a blood thinner for at least 3 months following his valve replacement surgery. It appears that he has a very serious cardiac condition. Options include proceeding with surgery for the renal mass prior to valve replacement, reimaging in 3 months and discuss surgical treatment  at that time whether it be radical nephrectomy versus partial nephrectomy. Risk of delaying surgery is developing metastatic disease. At this point the patient has been appropriately staged with imaging of the chest and labs. There does not appear to be any metastatic disease.  I reviewed the images in this case with my colleagues, Dr. Tresa Moore & Dr.Borden who agreewith this assessment.  Assessment:  Right renal mass.  Plan: He would like to proceed with right radical nephrectomy. He does not wish to attempt partial nephrectomy. We'll likely try to do this via a robotic approach.  We discussed imaging without and with IV contrast to better define this mass, but the patient defers this.  I will have to check with my surgery scheduler tomorrow to see what dates are available. We will try to do this as soon as possible and I would expect that we could do this in 1-2 weeks, but I do not know that we can do this within this week.    Pager: [phone]    CC: Dr. Ena Dawley

## 2013-08-28 NOTE — H&P (Signed)
History and Physical Interval Note:  NAME:  Kevin English   MRN: [id number] DOB:  11/14/1955   ADMIT DATE: 08/28/2013  PCP: Gilford Rile, MD Referring Cardiologist: Dr. Ena Dawley / Dr Dorris Carnes  Kevin English is a 58 y.o. male with hypertension hyperlipidemia and long-standing tobacco abuse or history of rheumatic heart disease/mitral stenosis diastolic heart failure who was recently admitted for symptomatic atrial fibrillation. The plan was removed to be sent to [facility] the hospital for mitral valve annuloplasty, however with his significant symptoms during atrial fibrillation, the decision was made to convert to standard CT Surgical Mitral Valve Replacement by Dr. Darylene Price. He is now referred for Right and Left Heart Catheterization as part of preop evaluation.  Past Medical History  Diagnosis Date  . Other abnormal glucose   . Benign neoplasm of colon   . Hernia   . Chronic rhinitis   . Diverticulitis of colon   . Hyperlipidemia   . HTN (hypertension)   . Hypogonadism male   . Hyperlipemia   . Leukocytosis   . Myalgia and myositis, unspecified   . Obesity   . Vitamin D deficiency   . OSA on CPAP   . Hypothyroidism   . Atrial fibrillation     sustained 160-180 on e-CARDIO monitor placed 07/29/2013/notes 08/09/2013  . Rheumatic heart disease   . Acute on chronic congestive heart failure   . Mitral stenosis 08/02/2013  . paroxysmal atrial fibrillation - recurrent, with RVR on admission 08/09/13    Past Surgical History  Procedure Laterality Date  . Inguinal hernia repair Bilateral     "I had one side done twice"  . Eye surgery Right 1990    "reconstructive"  . Tee without cardioversion N/A 08/16/2013    Procedure: TRANSESOPHAGEAL ECHOCARDIOGRAM (TEE);  Surgeon: Dorothy Spark, MD;  Location: [facility] END[facility];  Service: Cardiovascular;  Laterality: N/A;    FAMHx: Family History  Problem Relation Age of Onset  . Hypertension    . Hypertension Mother      SOCHx:  reports that he has been smoking Cigarettes.  He has a 37 pack-year smoking history. He has never used smokeless tobacco. He reports that he does not drink alcohol or use illicit drugs.  ALLERGIES: Allergies  Allergen Reactions  . Pravastatin Other (See Comments)    Myalgias     HOME MEDICATIONS: Prescriptions prior to admission  Medication Sig Dispense Refill  . amiodarone (PACERONE) 400 MG tablet Take 1 tablet (400 mg total) by mouth 2 (two) times daily.  120 tablet  3  . diltiazem (CARDIZEM CD) 120 MG 24 hr capsule Take 1 capsule (120 mg total) by mouth daily.  30 capsule  11  . enoxaparin (LOVENOX) 80 MG/0.8ML injection Inject 0.8 mLs (80 mg total) into the skin every 12 (twelve) hours.  4 Syringe  0  . furosemide (LASIX) 40 MG tablet Take 1 tablet (40 mg total) by mouth daily.  30 tablet  11  . levothyroxine (SYNTHROID, LEVOTHROID) 50 MCG tablet Take 50 mcg by mouth daily.       . potassium chloride SA (K-DUR,KLOR-CON) 20 MEQ tablet Take 1 tablet (20 mEq total) by mouth daily.  30 tablet  11  . warfarin (COUMADIN) 5 MG tablet Take 1 tablet (5 mg total) by mouth daily.  30 tablet  5   ROS:  Negative with the exception of CHF symptoms & fatigue.  No Syncope / near Syncope.  PHYSICAL EXAM:Blood pressure 174/82, pulse 55,  temperature 97.8 F (36.6 C), temperature source Oral, resp. rate 20, height 5\' 8"  (1.727 m), weight 198 lb (89.812 kg), SpO2 99.00%. General appearance: alert, cooperative, appears stated age, no distress and Anxious Neck: no adenopathy, no carotid bruit and no JVD Lungs: clear to auscultation bilaterally, normal percussion bilaterally and Nonlabored with good movement Heart: RRR, no obvious murmur heard Abdomen: soft, non-tender; bowel sounds normal; no masses,  no organomegaly Extremities: extremities normal, atraumatic, no cyanosis or edema Pulses: 2+ and symmetric Neurologic: Grossly normal  IMPRESSION & PLAN The patients' history has been  reviewed, patient examined, no change in status from most recent note, stable for surgery. I have reviewed the patients' chart and labs. Questions were answered to the patient's satisfaction.    Kevin English has presented today for surgery, with the diagnosis of Symptomatic Mitral Valve Stenosis. The various methods of treatment have been discussed with the patient and family.   Risks / Complications include, but not limited to: Death, MI, CVA/TIA, VF/VT (with defibrillation), Bradycardia (need for temporary pacer placement), contrast induced nephropathy, bleeding / bruising / hematoma / pseudoaneurysm, vascular or coronary injury (with possible emergent CT or Vascular Surgery), adverse medication reactions, infection.     After consideration of risks, benefits and other options for treatment, the patient has consented to Procedure(s):   [place]  as a surgical intervention.   We will proceed with the planned procedure.   [facility] [address] [place], [place]  [id number]  [phone]  08/28/2013 1:06 PM

## 2013-08-28 NOTE — Progress Notes (Signed)
ANTICOAGULATION CONSULT NOTE - Initial Consult  Pharmacy Consult for Lovenox Indication: atrial fibrillation  Allergies  Allergen Reactions  . Pravastatin Other (See Comments)    Myalgias     Patient Measurements: Height: 5\' 8"  (172.7 cm) Weight: 198 lb (89.812 kg) IBW/kg (Calculated) : 68.4 Heparin Dosing Weight: 87 kg  Vital Signs: Temp: 97.8 F (36.6 C) (06/08 1015) Temp src: Oral (06/08 1015) BP: 174/82 mmHg (06/08 1015) Pulse Rate: 55 (06/08 1015)  Labs:  Recent Labs  08/28/13 1104  LABPROT 12.9  INR 0.99    Estimated Creatinine Clearance: 77.8 ml/min (by C-G formula based on Cr of 1.1).   Medical History: Past Medical History  Diagnosis Date  . Other abnormal glucose   . Benign neoplasm of colon   . Hernia   . Chronic rhinitis   . Diverticulitis of colon   . Hyperlipidemia   . HTN (hypertension)   . Hypogonadism male   . Hyperlipemia   . Leukocytosis   . Myalgia and myositis, unspecified   . Obesity   . Vitamin D deficiency   . OSA on CPAP   . Hypothyroidism   . Atrial fibrillation     sustained 160-180 on e-CARDIO monitor placed 07/29/2013/notes 08/09/2013  . Rheumatic heart disease   . Acute on chronic congestive heart failure   . Mitral stenosis 08/02/2013  . paroxysmal atrial fibrillation - recurrent, with RVR on admission 08/09/13     Assessment: 58 YOM on coumadin PTA for afib, s/p cath, plan for valve surgery and possible CABG. Pharmacy is consulted to start lovneox 8 hrs after sheath removal (1515), INR 0.99 today. Hgb 15.8, plt 225. On 5/31. Pt. Was on lovenox bridge prior to admission, last dose was yesterday morning at 0800. Renal function is ok, scr 1.1, est. crcl ~ 75 ml/min.  Goal of Therapy:  Anti-Xa level 0.6-1 units/ml 4hrs after LMWH dose given Monitor platelets by anticoagulation protocol: Yes   Plan:  - Start Lovenox 90 mg sq Q 12 hrs, first dose at 2330 - f/u cbc and renal function. - F/u plans for surgery.  Maryanna Shape,  PharmD, BCPS  Clinical Pharmacist  Pager: [phone]   08/28/2013,3:42 PM

## 2013-08-28 NOTE — Progress Notes (Signed)
Pt received into room 2203, pt aware of bedrest till 1815, pt resting in bed with no complaints, TR band on Right radial, will continue to monitor pt Rickard Rhymes

## 2013-08-28 NOTE — Progress Notes (Signed)
[address]       [place], [address]             [phone]     CARDIOTHORACIC SURGERY PROGRESS NOTE  Day of Surgery  S/P Procedure(s) (LRB): LEFT AND RIGHT HEART CATHETERIZATION WITH CORONARY ANGIOGRAM (N/A)  Subjective: No complaints except for concerns related to news about renal mass.  Objective: Vital signs in last 24 hours: Temp:  [97.8 F (36.6 C)-97.9 F (36.6 C)] 97.9 F (36.6 C) (06/08 1603) Pulse Rate:  [51-55] 51 (06/08 1603) Cardiac Rhythm:  [-] Normal sinus rhythm;Sinus bradycardia (06/08 1540) Resp:  [18-20] 18 (06/08 1603) BP: (143-174)/(79-82) 143/79 mmHg (06/08 1603) SpO2:  [97 %-99 %] 97 % (06/08 1603) Weight:  [89.812 kg (198 lb)] 89.812 kg (198 lb) (06/08 1015)  Physical Exam:  Rhythm:   sinus  Breath sounds: clear  Heart sounds:  RRR  Incisions:  n/a  Abdomen:  soft  Extremities:  warm   Intake/Output from previous day:   Intake/Output this shift:    Lab Results: No results found for this basename: WBC, HGB, HCT, PLT,  in the last 72 hours BMET: No results found for this basename: NA, K, CL, CO2, GLUCOSE, BUN, CREATININE, CALCIUM,  in the last 72 hours  CBG (last 3)  No results found for this basename: GLUCAP,  in the last 72 hours PT/INR:   Recent Labs  08/28/13 1104  LABPROT 12.9  INR 0.99    CT ANGIOGRAPHY CHEST, ABDOMEN AND PELVIS  TECHNIQUE:  Multidetector CT imaging through the chest, abdomen and pelvis was  performed using the standard protocol during bolus administration of  intravenous contrast. Multiplanar reconstructed images and MIPs were  obtained and reviewed to evaluate the vascular anatomy.  CONTRAST: 34mL OMNIPAQUE IOHEXOL 350 MG/ML SOLN  COMPARISON: None.  FINDINGS:  CTA CHEST FINDINGS  No evidence of intramural hematoma on the precontrast images. Mild  atherosclerotic calcifications are present. Minimal calcification in  the LAD and circumflex coronary arteries.  No evidence of aortic  aneurysm or aortic dissection. Innominate  artery, right subclavian artery, right common carotid artery, right  vertebral artery, left common carotid artery left subclavian artery,  and left vertebral artery are all widely patent within the confines  of the exam.  Left atrium is markedly enlarged consistent with given history of  mitral valve malfunction. No evidence of abnormal mediastinal  adenopathy.  Clear lungs. No acute bony deformity.  Review of the MIP images confirms the above findings.  CTA ABDOMEN AND PELVIS FINDINGS  Aorta is non aneurysmal and patent without evidence of dissection.  Mild atherosclerotic plaque in the infrarenal aorta.  Separate takeoff for the splenic artery is widely patent. This leads  to the left gastric branch.  Common trunk for the common hepatic artery and SMA. This is a  completely replaced common hepatic artery from the SMA, a normal  variation. Branch vessels are patent.  IMA is diminutive and patent. Branch vessels are patent.  Single renal arteries are patent.  Diffuse atherosclerotic plaque in the right common iliac artery  resulting in 50% caliber narrowing. Mild narrowing in the proximal  right external iliac artery. It is there after patent. Right  internal iliac artery is patent.  Mild plaque in the left common iliac artery. Mild narrowing in the  proximal left external iliac artery. It is there after patent. Left  internal iliac artery is patent.  3.3 cm heterogeneously enhancing mass in the  lower pole of the right  kidney worrisome for renal cell carcinoma. Renal cysts are present.  Tiny angiomyolipoma in the upper pole of the left kidney.  Liver, gallbladder, spleen, pancreas, adrenal glands are within  normal limits.  No evidence of abnormal retroperitoneal adenopathy by measurement  criteria. Tiny pericaval nodes adjacent of the right kidney are  present.  No free-fluid.  Normal appendix. Sigmoid diverticulosis without acute  diverticulitis  Very large bilateral inguinal hernias containing adipose tissue.  Bladder and prostate are within normal limits.  No acute bony deformity.  Review of the MIP images confirms the above findings.  IMPRESSION:  No evidence of thoracic aortic dissection or intramural hematoma.  No acute thoracic pathology.  No evidence of dissection within the abdominal aorta. Infrarenal  atherosclerotic plaque is present.  Diffuse disease in the right common iliac artery resulting in 50%  narrowing of the caliber. Non significant disease of the remainder  of the iliac vasculature.  3.3 cm mass in the lower pole of the right kidney worrisome for  renal cell carcinoma.  Large bilateral inguinal hernias containing adipose tissue.  Electronically Signed  By: Maryclare Bean M.D.  On: 08/25/2013 14:49      CARDIAC CATHETERIZATION REPORT   NAME: FERRELL CLAIBORNE MRN: [id number]  DOB: 1956/03/05 ADMIT DATE: 08/28/2013  Procedure Date: 08/28/2013  INTERVENTIONAL CARDIOLOGIST: Leonie Man, M.D., MS  PRIMARY CARE PROVIDER: Gilford Rile, MD  PRIMARY CARDIOLOGIST: Dorris Carnes, MD & Ena Dawley, MD  CT Surgeon: Dr. Roxy Manns  PATIENT: Kevin English is a 58 y.o. male known rheumatic heart disease with mitral stenosis referred for right heart catheterization as part of evaluation for preop mitral valve replacement. His recent trouble with atrial fibrillation with heart failure therefore is being sent for valve replacement as opposed to balloon valvuloplasty.  PRE-OPERATIVE DIAGNOSIS:  Moderate-severe marked stenosis  Worsening heart failure symptoms  Atrial fibrillation, recurrent PROCEDURES PERFORMED:  Right & Left Heart Catheterization with Native Coronary Angiography via Right Radial Artery & Right Common Femoral Vein Access. PROCEDURE: The patient was brought to the [facility] in the fasting state and prepped and draped in the usual sterile fashion for right radial  and/or femoral arterial or radial access. A modified Johnmark's test was performed on the right wrist demonstrating excellent collateral flow. Sterile technique was used including antiseptics, cap, gloves, gown, hand hygiene, mask and sheet. Skin prep: Chlorhexidine.  Consent: Risks of procedure as well as the alternatives and risks of each were explained to the (patient/caregiver). Consent for procedure obtained.  Time Out: Verified patient identification, verified procedure, site/side was marked, verified correct patient position, special equipment/implants available, medications/allergies/relevent history reviewed, required imaging and test results available. Performed.  Access:  Initial attempts were made to exchange the existing 18-gauge right antecubital IV over a wire for a 6 Fr glide sheath were unsuccessful, the decision was made to abort the brachial approach and obtained femoral venous access.  Right Common Femoral Vein: 7 Fr Sheath - Seldinger Technique. Right Radial Artery: 6 Fr Sheath - modified Seldinger Technique  4500 Units IV Heparin; 10 mL Radial Cocktail Right Heart Catheterization: 7Fr Swan Ganz catheter advanced under fluoroscopy with balloon inflated to the RA, RV, then PCWP-PA for hemodynamic measurement.  Simultaneous FA & PA blood gases checked for SaO2% to calculate FICK CO/CI  Thermodilution Injections performed to calculate CO/CI  Simultaneous PCWP/LV & RV/LV pressures monitored with Angled Pigtail in LV.  Catheter removed completely out of the body  with balloon deflated.  Left Heart Catheterization: 5 Fr Catheters advanced or exchanged over a J-wire; TIG 4.0 catheter advanced first.  LV Hemodynamics: TIG 4.0  Left Coronary Artery Cineangiography: JL43.5Catheter  Right Coronary Artery: JR 4 Catheter  R Femoral Venous Sheath removed in the holding area with manual pressure for hemostasis.  TR Band: 15 mL @ 8527  FINDINGS:  Hemodynamics:  Findings:   SaO2%  Pressures  mmHg  Mean P  mmHg  EDP  mmHg   Right Atrium   11/9   8   Right Ventricle   31/1   5   Pulmonary Artery  72  39.6  19    PCWP   17/13  13    Central Aortic  91  131/77  99    Left Ventricle   130/0   6          Cardiac Output:   Cardiac Index:    Fick  6.65   3.26    Thermodilution  3.97   1 .95    Left Ventriculography: Deferred  Coronary Anatomy: Left Dominant  Left Main: Large-caliber, dominant vessel that bifurcates into the LAD and Circumflex. Angiographically normal. Short vessel LAD: Large caliber vessel that gives off a very large proximal septal perforator trunk and a major diagonal branch. At this branch point is a roughly 40-50% stenosis. The vessel then continues distally to gives off a second diagonal branch. After the diagonal branch the vessel then tapers to a very small-caliber vessel the apex. He barely reaches the apex. After D2 there appears to be a focal lesion although this was not very well visualized. Probably 50-60%.  D1: Moderate caliber proximal vessel it covers the anterolateral wall. Minimal luminal irregularities with several branches.  D2: Moderate caliber vessel, mid LAD, minimal luminal irregularities. At this point the vessel is actually larger than the follow on LAD. Left Circumflex: Very large caliber, dominant vessel. It gives rise to a very small marginal branch that runs like a ramus intermedius with diffuse disease including a proximal 70-80% stenosis. This is not amenable to PCI or CABG. There is an another large lateral OM before the vessel courses into the AV groove giving rise to 2 small posterior lateral branches and the posterior descending artery. Just after the posterior lateral branches going into the PDA there is a focal irregular 70% stenosis.  OM1: Large-caliber, lateral branch mild luminal irregularities. This vessel courses down to the inferolateral apex.  LPDA: Moderate large-caliber vessel with a proximal disease noted above. Beyond that has  mild diffuse luminal irregularities of 10-20%. It reaches almost to the apex. RCA: Small caliber, nondominant vessel. There is a separate ostium for the SA nodal artery to the main RCA has a early mid 30-40% stenosis that branches into 2 smaller marginal branches.  MEDICATIONS:  Anesthesia: Local Lidocaine 2 mL for radial, 12 ml for femoral vein Sedation: 3 mg IV Versed, 75 mcg IV fentanyl ;  Omnipaque Contrast: 80 ml  Anticoagulation: IV Heparin 4500 Units ; Radial Cocktail: 5 mg Verapamil, 400 mcg NTG, 2 ml 2% Lidocaine in 10 ml NS PATIENT DISPOSITION:  The patient was transferred to the PACU holding area in a hemodynamicaly stable, chest pain free condition.  The patient tolerated the procedure well, and there were no complications. EBL: < 10 ml  The patient was stable before, during, and after the procedure. POST-OPERATIVE DIAGNOSIS:  Known moderate stenosis. Gradient estimated of 5.8 mmHg by catheter  Conflicting cardiac outputs between  Fick and thermodilution. Likely related to mitral valve disease  Moderate CAD with most significant being at his 70% distal circumflex-proximal LPDA Asian. Also tandem moderate lesions in the LAD of unclear significance, with a small downstream vessel.Marland Kitchen PLAN OF CARE:  The patient will be admitted for planned inpatient valve surgery  I will review the films with Dr. Roxy Manns to determine whether or not he feels that CABG is warranted.  Continue home medications. Leonie Man, M.D., M.S.  [facility]  [address] [place], [place] [address]  [phone]  08/28/2013  3:00 PM       Assessment/Plan: S/P Procedure(s) (LRB): LEFT AND RIGHT HEART CATHETERIZATION WITH CORONARY ANGIOGRAM (N/A)  Mr Suydam is well-known to me.  Please see full consultation report from 08/11/2013 and f/u office visit on 08/24/2013 in EP[facility].  He underwent left and right heart cath today in anticipation of possible mitral valve  repair/replacement and maze procedure later this week.  Cath films and CTA reviewed, and results discussed at length with the patient and his wife.  Mr Houp does not have significant CAD.  LV function is normal, and PA pressures only mildly elevated.  He has only moderate mitral stenosis.   Indications for mitral valve repair/replacement and maze are relative, and based upon the patient's continued symptoms with recurrent atrial fibrillation.    CTA of the abdomen/pelvis has discovered a 3 cm mass in the right kidney suspicious for renal cell CA.  Options include proceeding with mitral valve repair/replacement + maze procedure with delayed nephrectomy versus proceeding with nephrectomy first and postponing cardiac surgery.  He would likely tolerate elective nephrectomy prior to cardiac surgery without significant cardiac risk with exception of the possibility that he might develop recurrent Afib.  If we proceed with cardiac surgery there is a significant likelihood that nephrectomy would need to be delayed for at least 3-6 months.  Moreover, there is >50% likelihood that his mitral valve will need to be replaced using a mechanical valve, which would significantly complicate the management of anticoagulation and the risks of bleeding after nephrectomy.  I favor postponing cardiac surgery and proceeding with work-up and surgery for renal mass as soon as practical (perhaps this week while he's off of coumadin).  I would continue amiodarone to decrease likelihood of problems with Afib postoperatively.  Discussed at length with Dr [name], who concurs with plan.  I spent in excess of 30 minutes during the conduct of this hospital encounter and >50% of this time involved direct face-to-face encounter with the patient for counseling and/or coordination of their care.   Rexene Alberts 08/28/2013 6:30 PM

## 2013-08-29 ENCOUNTER — Encounter (HOSPITAL_COMMUNITY): Payer: Self-pay | Admitting: Physician Assistant

## 2013-08-29 ENCOUNTER — Encounter (HOSPITAL_COMMUNITY): Payer: Managed Care, Other (non HMO)

## 2013-08-29 ENCOUNTER — Other Ambulatory Visit: Payer: Self-pay | Admitting: Physician Assistant

## 2013-08-29 DIAGNOSIS — I099 Rheumatic heart disease, unspecified: Secondary | ICD-10-CM

## 2013-08-29 DIAGNOSIS — N182 Chronic kidney disease, stage 2 (mild): Secondary | ICD-10-CM | POA: Diagnosis present

## 2013-08-29 DIAGNOSIS — I4891 Unspecified atrial fibrillation: Secondary | ICD-10-CM

## 2013-08-29 DIAGNOSIS — N289 Disorder of kidney and ureter, unspecified: Secondary | ICD-10-CM

## 2013-08-29 DIAGNOSIS — I2510 Atherosclerotic heart disease of native coronary artery without angina pectoris: Secondary | ICD-10-CM | POA: Diagnosis present

## 2013-08-29 DIAGNOSIS — I050 Rheumatic mitral stenosis: Principal | ICD-10-CM

## 2013-08-29 LAB — CBC WITH DIFFERENTIAL/PLATELET
Basophils Absolute: 0 K/uL (ref 0.0–0.1)
Basophils Relative: 0 % (ref 0–1)
Eosinophils Absolute: 0.2 K/uL (ref 0.0–0.7)
Eosinophils Relative: 1 % (ref 0–5)
HCT: 49.5 % (ref 39.0–52.0)
Hemoglobin: 16.9 g/dL (ref 13.0–17.0)
Lymphocytes Relative: 24 % (ref 12–46)
Lymphs Abs: 3.2 K/uL (ref 0.7–4.0)
MCH: 32.5 pg (ref 26.0–34.0)
MCHC: 34.1 g/dL (ref 30.0–36.0)
MCV: 95.2 fL (ref 78.0–100.0)
Monocytes Absolute: 0.9 K/uL (ref 0.1–1.0)
Monocytes Relative: 7 % (ref 3–12)
Neutro Abs: 9.2 K/uL — ABNORMAL HIGH (ref 1.7–7.7)
Neutrophils Relative %: 68 % (ref 43–77)
Platelets: 220 K/uL (ref 150–400)
RBC: 5.2 MIL/uL (ref 4.22–5.81)
RDW: 13.9 % (ref 11.5–15.5)
WBC: 13.5 K/uL — ABNORMAL HIGH (ref 4.0–10.5)

## 2013-08-29 LAB — BASIC METABOLIC PANEL WITH GFR
BUN: 13 mg/dL (ref 6–23)
CO2: 25 meq/L (ref 19–32)
Calcium: 9.5 mg/dL (ref 8.4–10.5)
Chloride: 99 meq/L (ref 96–112)
Creatinine, Ser: 1.38 mg/dL — ABNORMAL HIGH (ref 0.50–1.35)
GFR calc Af Amer: 63 mL/min — ABNORMAL LOW (ref >90)
GFR calc non Af Amer: 54 mL/min — ABNORMAL LOW (ref >90)
Glucose, Bld: 217 mg/dL — ABNORMAL HIGH (ref 70–99)
Potassium: 4.3 meq/L (ref 3.7–5.3)
Sodium: 138 meq/L (ref 137–147)

## 2013-08-29 MED ORDER — AMIODARONE HCL 400 MG PO TABS: 400.0000 mg | ORAL_TABLET | Freq: Every day | ORAL | Status: DC

## 2013-08-29 MED ORDER — ENOXAPARIN SODIUM 100 MG/ML ~~LOC~~ SOLN: 90.0000 mg | Freq: Two times a day (BID) | SUBCUTANEOUS | Status: DC

## 2013-08-29 NOTE — Progress Notes (Signed)
Mr. Ackert will undergo total right nephrectomy on Tuesday 09/05/2013. He has been anticoagulated with Coumadin and currently on Lovenox bridging for paroxysmal atrial fibrillation. Since his surgery is planned for next week we won't restart Coumadin and continue using Lovenox until the night prior to surgery. We recommend to restart anticoagulation post surgery as soon as acceptable from surgical standpoint.  We will follow the patient in the perioperative period for management of mitral stenosis and CHF.  Dorothy Spark 08/29/2013

## 2013-08-29 NOTE — Progress Notes (Signed)
UR Completed.  Nekita Pita Jane Robin Petrakis [phone] 08/29/2013  

## 2013-08-29 NOTE — Progress Notes (Signed)
Nutrition Brief Note  Patient identified on the Malnutrition Screening Tool (MST) Report for recent weight lost without trying and eating poorly because of a decreased appetite.  Per readings below, patient's weight has been stable.  Wt Readings from Last 15 Encounters:  08/28/13 198 lb (89.812 kg)  08/28/13 198 lb (89.812 kg)  08/24/13 198 lb (89.812 kg)  08/18/13 197 lb 1.5 oz (89.4 kg)  08/18/13 197 lb (89.359 kg)  08/16/13 197 lb (89.359 kg)  08/16/13 197 lb (89.359 kg)  08/10/13 197 lb 11.2 oz (89.676 kg)  08/01/13 198 lb (89.812 kg)  07/20/13 201 lb (91.173 kg)  12/20/12 199 lb (90.266 kg)    Body mass index is 30.11 kg/(m^2). Patient meets criteria for Obesity Class I based on current BMI.   Current diet order is Heart Healthy, patient is consuming approximately 100% of meals at this time. Labs and medications reviewed.   No nutrition interventions warranted at this time. If nutrition issues arise, please consult RD.   Arthur Holms, RD, LDN Pager #: [phone] After-Hours Pager #: [phone]

## 2013-08-29 NOTE — Progress Notes (Signed)
Patient: Kevin English / Admit Date: 08/28/2013 / Date of Encounter: 08/29/2013, 8:03 AM  Subjective  No acute complaints. Just overwhelmed.  Objective   Telemetry: NSR/SB  Physical Exam: Blood pressure 138/73, pulse 49, temperature 97.8 F (36.6 C), temperature source Oral, resp. rate 18, height 5\' 8"  (1.727 m), weight 198 lb (89.812 kg), SpO2 98.00%. General: Well developed, well nourished WM in no acute distress. Head: Normocephalic, atraumatic, sclera non-icteric, no xanthomas, nares are without discharge. Neck: Negative for carotid bruits. JVP not elevated. Lungs: Clear bilaterally to auscultation without wheezes, rales, or rhonchi. Breathing is unlabored. Heart: RRR S1 S2 without murmurs, rubs, or gallops.  Abdomen: Soft, non-tender, non-distended with normoactive bowel sounds. No rebound/guarding. Extremities: No clubbing or cyanosis. No edema. Distal pedal pulses are 2+ and equal bilaterally. R groin without hematoma or bruit. R radial without complication, in tact pulse. Neuro: Alert and oriented X 3. Moves all extremities spontaneously. Psych:  Responds to questions appropriately with a normal affect.   Intake/Output Summary (Last 24 hours) at 08/29/13 0803 Last data filed at 08/28/13 2004  Gross per 24 hour  Intake    480 ml  Output      0 ml  Net    480 ml    Inpatient Medications:  . amiodarone  400 mg Oral BID  . diltiazem  120 mg Oral Daily  . enoxaparin (LOVENOX) injection  90 mg Subcutaneous Q12H  . furosemide  40 mg Oral Daily  . levothyroxine  50 mcg Oral QAC breakfast  . potassium chloride SA  20 mEq Oral Daily  . sodium chloride  3 mL Intravenous Q12H   Infusions:    Labs: No results found for this basename: NA, K, CL, CO2, GLUCOSE, BUN, CREATININE, CALCIUM, MG, PHOS,  in the last 72 hours No results found for this basename: AST, ALT, ALKPHOS, BILITOT, PROT, ALBUMIN,  in the last 72 hours No results found for this basename: WBC, NEUTROABS, HGB, HCT,  MCV, PLT,  in the last 72 hours No results found for this basename: CKTOTAL, CKMB, TROPONINI,  in the last 72 hours No components found with this basename: POCBNP,  No results found for this basename: HGBA1C,  in the last 72 hours   Radiology/Studies:  Dg Chest Port 1 View  08/18/2013   CLINICAL DATA:  Chest pain.  Hypertension.  EXAM: PORTABLE CHEST - 1 VIEW  COMPARISON:  07/2013  FINDINGS: The heart size and mediastinal contours are within normal limits. Both lungs are clear. The visualized skeletal structures are unremarkable.  IMPRESSION: No active disease.   Electronically Signed   By: Earle Gell M.D.   On: 08/18/2013 14:27   Dg Chest Port 1 View  08/09/2013   CLINICAL DATA:  TACHYCARDIA  EXAM: PORTABLE CHEST - 1 VIEW  COMPARISON:  DG CHEST 2V dated 05/20/2009  FINDINGS: Mild enlargement of the cardiac silhouette. Engorgement of the central pulmonary vascularity. Increased interstitial markings consistent with interstitial edema. No pleural effusion or alveolar infiltrate. No acute bony thoracic abnormality.  IMPRESSION: Congestive heart failure with mild pulmonary interstitial edema.   Electronically Signed   By: David  Martinique   On: 08/09/2013 13:32   Ct Angio Chest Aorta W/cm &/or Wo/cm  08/25/2013   CLINICAL DATA:  Preop, mitral valve replacement. Rule out aortic dissection.  EXAM: CT ANGIOGRAPHY CHEST, ABDOMEN AND PELVIS  TECHNIQUE: Multidetector CT imaging through the chest, abdomen and pelvis was performed using the standard protocol during bolus administration of intravenous contrast. Multiplanar  reconstructed images and MIPs were obtained and reviewed to evaluate the vascular anatomy.  CONTRAST:  15mL OMNIPAQUE IOHEXOL 350 MG/ML SOLN  COMPARISON:  None.  FINDINGS: CTA CHEST FINDINGS  No evidence of intramural hematoma on the precontrast images. Mild atherosclerotic calcifications are present. Minimal calcification in the LAD and circumflex coronary arteries.  No evidence of aortic aneurysm  or aortic dissection. Innominate artery, right subclavian artery, right common carotid artery, right vertebral artery, left common carotid artery left subclavian artery, and left vertebral artery are all widely patent within the confines of the exam.  Left atrium is markedly enlarged consistent with given history of mitral valve malfunction. No evidence of abnormal mediastinal adenopathy.  Clear lungs.  No acute bony deformity.  Review of the MIP images confirms the above findings.  CTA ABDOMEN AND PELVIS FINDINGS  Aorta is non aneurysmal and patent without evidence of dissection. Mild atherosclerotic plaque in the infrarenal aorta.  Separate takeoff for the splenic artery is widely patent. This leads to the left gastric branch.  Common trunk for the common hepatic artery and SMA. This is a completely replaced common hepatic artery from the SMA, a normal variation. Branch vessels are patent.  IMA is diminutive and patent.  Branch vessels are patent.  Single renal arteries are patent.  Diffuse atherosclerotic plaque in the right common iliac artery resulting in 50% caliber narrowing. Mild narrowing in the proximal right external iliac artery. It is there after patent. Right internal iliac artery is patent.  Mild plaque in the left common iliac artery. Mild narrowing in the proximal left external iliac artery. It is there after patent. Left internal iliac artery is patent.  3.3 cm heterogeneously enhancing mass in the lower pole of the right kidney worrisome for renal cell carcinoma. Renal cysts are present. Tiny angiomyolipoma in the upper pole of the left kidney.  Liver, gallbladder, spleen, pancreas, adrenal glands are within normal limits.  No evidence of abnormal retroperitoneal adenopathy by measurement criteria. Tiny pericaval nodes adjacent of the right kidney are present.  No free-fluid.  Normal appendix. Sigmoid diverticulosis without acute diverticulitis  Very large bilateral inguinal hernias containing  adipose tissue.  Bladder and prostate are within normal limits.  No acute bony deformity.  Review of the MIP images confirms the above findings.  IMPRESSION: No evidence of thoracic aortic dissection or intramural hematoma.  No acute thoracic pathology.  No evidence of dissection within the abdominal aorta. Infrarenal atherosclerotic plaque is present.  Diffuse disease in the right common iliac artery resulting in 50% narrowing of the caliber. Non significant disease of the remainder of the iliac vasculature.  3.3 cm mass in the lower pole of the right kidney worrisome for renal cell carcinoma.  Large bilateral inguinal hernias containing adipose tissue.   Electronically Signed   By: Maryclare Bean M.D.   On: 08/25/2013 14:49   Ct Angio Abd/pel W/ And/or W/o  08/25/2013   CLINICAL DATA:  Preop, mitral valve replacement. Rule out aortic dissection.  EXAM: CT ANGIOGRAPHY CHEST, ABDOMEN AND PELVIS  TECHNIQUE: Multidetector CT imaging through the chest, abdomen and pelvis was performed using the standard protocol during bolus administration of intravenous contrast. Multiplanar reconstructed images and MIPs were obtained and reviewed to evaluate the vascular anatomy.  CONTRAST:  60mL OMNIPAQUE IOHEXOL 350 MG/ML SOLN  COMPARISON:  None.  FINDINGS: CTA CHEST FINDINGS  No evidence of intramural hematoma on the precontrast images. Mild atherosclerotic calcifications are present. Minimal calcification in the LAD and circumflex coronary  arteries.  No evidence of aortic aneurysm or aortic dissection. Innominate artery, right subclavian artery, right common carotid artery, right vertebral artery, left common carotid artery left subclavian artery, and left vertebral artery are all widely patent within the confines of the exam.  Left atrium is markedly enlarged consistent with given history of mitral valve malfunction. No evidence of abnormal mediastinal adenopathy.  Clear lungs.  No acute bony deformity.  Review of the MIP images  confirms the above findings.  CTA ABDOMEN AND PELVIS FINDINGS  Aorta is non aneurysmal and patent without evidence of dissection. Mild atherosclerotic plaque in the infrarenal aorta.  Separate takeoff for the splenic artery is widely patent. This leads to the left gastric branch.  Common trunk for the common hepatic artery and SMA. This is a completely replaced common hepatic artery from the SMA, a normal variation. Branch vessels are patent.  IMA is diminutive and patent.  Branch vessels are patent.  Single renal arteries are patent.  Diffuse atherosclerotic plaque in the right common iliac artery resulting in 50% caliber narrowing. Mild narrowing in the proximal right external iliac artery. It is there after patent. Right internal iliac artery is patent.  Mild plaque in the left common iliac artery. Mild narrowing in the proximal left external iliac artery. It is there after patent. Left internal iliac artery is patent.  3.3 cm heterogeneously enhancing mass in the lower pole of the right kidney worrisome for renal cell carcinoma. Renal cysts are present. Tiny angiomyolipoma in the upper pole of the left kidney.  Liver, gallbladder, spleen, pancreas, adrenal glands are within normal limits.  No evidence of abnormal retroperitoneal adenopathy by measurement criteria. Tiny pericaval nodes adjacent of the right kidney are present.  No free-fluid.  Normal appendix. Sigmoid diverticulosis without acute diverticulitis  Very large bilateral inguinal hernias containing adipose tissue.  Bladder and prostate are within normal limits.  No acute bony deformity.  Review of the MIP images confirms the above findings.  IMPRESSION: No evidence of thoracic aortic dissection or intramural hematoma.  No acute thoracic pathology.  No evidence of dissection within the abdominal aorta. Infrarenal atherosclerotic plaque is present.  Diffuse disease in the right common iliac artery resulting in 50% narrowing of the caliber. Non  significant disease of the remainder of the iliac vasculature.  3.3 cm mass in the lower pole of the right kidney worrisome for renal cell carcinoma.  Large bilateral inguinal hernias containing adipose tissue.   Electronically Signed   By: Maryclare Bean M.D.   On: 08/25/2013 14:49     Assessment and Plan  1. Rheumatic heart disease with moderate-severe mitral stenosis - [facility] has evaluated patient and feels he only has moderate mitral stenosis. Indications for mitral valve repair/replacement and maze are relative, and based upon the patient's continued symptoms with recurrent atrial fibrillation. Complicating the plan for surgery is a newly diagnosed renal mass by outpatient CT suggestive of renal cell carcinoma. Dr. Roxy Manns favors post-poning cardiac surgery and proceeding with work-up and surgery for renal mass as soon as practical, and to continue amiodarone to decrease likelihood of problems with AF post-operatively. See below. 2. Renal mass suspicious for renal cancer  - Urology feels this would be difficult to remove with partial nephrectomy. Do not recommend biopsy at this time due to bleeding, false negative rate and possible seeding of tumor. They want to proceed with radical nephrectomy which will likely result in CKD stage III. We will await input from nephrology about scheduling surgery.  Hopeful we may be able to do this while he's off Coumadin. Recheck BMET this AM post-cath. 3. Moderate CAD with 70% distal cx-prox LPDA; tandem mod LAD lesions, o/w mild dz - it does not seem that [facility] feels this is significant based on their consult note. Consider statin initiation. 4. Paroxysmal atrial fibrillation/atrial flutter - currently NSR/SB. Will defer to MD regarding whether to continue amiodarone at 400mg  BID. He is currently on Lovenox->Coumadin bridge. 5. Chronic diastolic CHF - continue home regimen. Likely in part due to #1. 6. HTN - controlled.  7. Leukocytosis - outpatient CBCs have all shown a  smoldering leukocytosis. Diff showing absolute lymphocytosis. ? If related to #2 in any way. Will repeat CBC with diff. He may have to have this worked up as well. He states there was concern about it in the past but that after several checks it normalized.   Dispo: pending urology input about surgical planning. We may just need to keep him on Lovenox pending surgery rather than restarting Coumadin only to hold it again next week.  Signed, Melina Copa PA-C   Patient seen and examined. Agree with assessment and plan. Pt tells me his urology surgery will be 6/16 at [facility]. Will dc today and continue with lovenox anticoagulation with last dose the day before urology surgery rather than re-institute coumadin.   Troy Sine, MD, [facility] 08/29/2013 11:07 AM

## 2013-08-29 NOTE — Significant Event (Signed)
1245pm-Patient discharge to home. Reviewed AVS with patient thoroughly. Patient verbalized understanding of medications regimen at home and follow-up appointments. Copy of AVS given to patient. All personal belongings are with patient and his spouse. Patient wanted to walk to transportation. RN accompanied patient to transportation. No issues prior to discharge. Joseff Luckman, Therapist, sports.

## 2013-08-29 NOTE — Discharge Summary (Signed)
Discharge Summary   Patient ID: Kevin English MRN: [id number], DOB/AGE: [birth year] 58 y.o. Admit date: 08/28/2013 D/C date:     08/29/2013  Primary Care Provider: Gilford Rile, MD Primary Cardiologist: [name]  Primary Discharge Diagnoses:  1. Rheumatic heart disease with moderate mitral stenosis 2. Renal mass suspicious for renal cancer 3. Moderate CAD with 70% distal cx-prox LPDA; tandem mod LAD lesions, o/w mild dz 4. Paroxysmal atrial fibrillation/atrial flutter, currently NSR 5. Chronic diastolic diastolic CHF in setting of #1, #4, euvolemic this admission 6. HTN 7. Leukocytosis, persistent - instructed to f/u PCP 8. CKD stage II - mildly elevated Cr post-cath at 1.38, for repeat BMET 48 hrs 9. Hyperglycemia - prior hx of pre-DM  Secondary Discharge Diagnoses:  1. Tobacco abuse, cessation advised 2. H/o Benign neoplasm of colon 3. Hernia 4. Chronic rhinitis 5. Hyperlipidemia, with h/o myalgias with statin 6. Obesity Body mass index is 30.11 kg/(m^2). 7. Vitamin D deficiency 8. OSA on CPAP 9. Hypothyroidism  Hospital Course:Kevin English is a 58 y.o. male with a history of HTN, hyperlipidemia, hypothyroidism, tobacco abuse, and recently diagnosed mitral stenosis/rheumatic heart disease, PAF/paroxysmal atrial flutter, and diastolic CHF who presented to [facility] yesterday for planned [facility].   He was recently evaluated by Dr. [name] for fatigue and SOB. Echo 07/20/13: mitral stenosis, mean gradient of 7 mmHg, peak gradient 60 mmHg and valve area by pressure half-time 1.26 cm. This was a big change when compared to the echocardiogram in 2011 when the gradients were normal. Since that time, the patient has developed symptomatic episodes of paroxysmal atrial fibrillation with RVR and a-flutter. He was originally on Xarelto then changed to Coumadin once his atrial fib was felt to be valvular in nature. He was admitted briefly 6/57-8/46 for a/c diastolic CHF. He underwent outpatient  TEE on 08/16/13 showing moderate mitral stenosis ("Hockey stick appearance of the mitral valve, predominantly of the anterior leaflet that is thickened and mildly calcified. Restricted opening. Peak trans mitral gradient 15 mmHg, mean gradient 7 mmHg. Mobility is restricted predominantly of the anterior leaflet. Diastolic leaflet doming was present. Moderate mitral stenosis, trace mitral regurgitation." There was also mod dilated LA, no thrombus, mild AI, trace TR, minimal AS. He was briefly readmitted on 5/29-5/31 with symptomatic recurrence of AF and placed on amiodarone. He saw Dr. Roxy Manns in outpatient consultation for further consideration of surgery. Anatomical findings appeared relatively favorable for possible balloon mitral valvuloplasty. However, the patient's problems with recurrent paroxysmal atrial fibrillation seemed to be driving much of his recent symptom progression, and there remained a significant likelihood that atrial fibrillation may persist despite successful balloon valvuloplasty. Alternatively, mitral valve repair or replacement with Maze procedure would offer the potential to deal with both problems more definitively. The primary disadvantage of valve repair would be the potential for progression of disease with the possibility of developing recurrent mitral stenosis and/or mitral regurgitation in the future. Valve replacement using a mechanical prosthesis would provide a more durable result but come with the need for long-term anticoagulation and the associated risk of stroke or other valve related complications. The patient did not want to try balloon valvuloplasty and instead elected to proceed with surgery as soon as possible. Dr. Roxy Manns recommended R&L cardiac cath and the patient was bridged with Coumadin->Lovenox. He was brought in for this procedure 08/28/13 showing: - Known moderate stenosis. Gradient estimated of 5.8 mmHg by catheter  - Conflicting cardiac outputs between Fick and  thermodilution. Likely related to mitral valve disease  -  Moderate CAD with most significant being at his 70% distal circumflex-proximal LPDA lesion. Also tandem moderate lesions in the LAD of unclear significance, with a small downstream vessel. Otherwise mild disease 30-40% in the RCA  Dr. Roxy Manns was re-consulted for management. As part of pre-operative valuation, the patient had undergone CT angio abd/pelvis showing 3.3cm mass in the lower pole of the right kidney worrisome for renal cell carcinoma. The patient had denied any unusual weight changes, night sweats, or gross hematuria. Dr. Jasmine December with urology was consulted. He felt this would be difficult to remove with partial nephrectomy given the depth of the tumor and the location. The patient declined contrasted CT at this time, and urology did not recommend biopsy at this time due to bleeding, false negative rate and possible seeding of tumor. They ultimately recommended to proceed with radical nephrectomy which will likely result in CKD stage III (based on Cr, patient likely has CKD stage II at baseline with GFR ~60s-70s)  Dr. Roxy Manns reviewed timing of cardiac surgery (including proceeding with mitral valve repair/replacement + maze procedure with delayed nephrectomy versus proceeding with nephrectomy first and postponing cardiac surgery). He felt that the patient did not have significant CAD. In light of normal LV function and only mildly elevated PA pressures with moderate mitral stenosis, indications for mitral valve repair/replacement and maze were relative and based upon the patient's continued symptoms with recurrent atrial fibrillation. He felt the patient would likely tolerate elective nephrectomy prior to cardiac surgery without significant cardiac risk with exception of the possibility that he might develop recurrent Afib. If we proceed with cardiac surgery there is a significant likelihood that nephrectomy would need to be delayed for at least  3-6 months. Moreover, there is >50% likelihood that his mitral valve will need to be replaced using a mechanical valve, which would significantly complicate the management of anticoagulation and the risks of bleeding after nephrectomy. Ultimately Dr. Roxy Manns favored postponing cardiac surgery and proceeding with work-up and surgery for renal mass as soon as practical. As an aside, we can consider adding statin as outpatient (has h/o myalgias/muscular issues with pravastatin).  Due to scheduling availability, Dr. Jasmine December has scheduled renal surgery for 09/05/13 at noon. [facility] will be calling the patient with pre-op instructions. Since it doesn't make sense to load the patient with Coumadin in a 7 day span of time only to stop it, we will continue Lovenox for now. He is not a candidate for NOAC due to this being valvular AF. Per discussion with urology, last dose of Lovenox should be the night before his surgery. Pharmacy has dosed this at 90mg  BID based on weight of 89.8 kg. Since the patient has been on loading dose of amiodarone 400mg  BID since admission over 2 weeks ago, we reduced the dose to 400mg  daily at discharge. He should continue this moderate dose perioperatively to reduce risk of peri-operative atrial fibrillation. We recommend to restart anticoagulation post surgery as soon as acceptable from surgical standpoint. We will plan to follow perioperatively.  Three other issues this admission:  - WBC remains mildly elevated at 13.5. The patient reports a prior history of leukocytosis followed by PCP in the past but states it had resolved. Differential several weeks ago reported absolute lymphocytosis, but that is not mentioned on today's differential. He is afebrile and no obvious source of infection. We have asked him to touch base with PCP about further workup. We wonder if this is some sort of reactive leukocytosis related to  his renal issues/cardiac issues but want to make sure this is  evaluated. - Post-cath BMET showed mild Cr bump to 1.38, normal BUN. We will recheck in 2 days. I also discussed with Dr. [name] regarding Lasix - she says the patient is only really ever in CHF when he goes into AF, and given normal volume status at present, will hold Lasix and KCl for now. - Blood sugar was also elevated - instructed to f/u PCP. A1C in 2014 was 6.1.  Discharge Vitals: Blood pressure 138/73, pulse 49, temperature 97.8 F (36.6 C), temperature source Oral, resp. rate 18, height 5\' 8"  (1.727 m), weight 198 lb (89.812 kg), SpO2 98.00%.  Labs: Lab Results  Component Value Date   WBC 13.5* 08/29/2013   HGB 16.9 08/29/2013   HCT 49.5 08/29/2013   MCV 95.2 08/29/2013   PLT 220 08/29/2013     Recent Labs Lab 08/29/13 0840  NA 138  K 4.3  CL 99  CO2 25  BUN 13  CREATININE 1.38*  CALCIUM 9.5  GLUCOSE 217*    Diagnostic Studies/Procedures   Cath 08/28/13 CARDIAC CATHETERIZATION REPORT  NAME: Kevin English MRN: [id number]  DOB: 11/13/1955 ADMIT DATE: 08/28/2013  Procedure Date: 08/28/2013  INTERVENTIONAL CARDIOLOGIST: Leonie Man, M.D., MS  PRIMARY CARE PROVIDER: Gilford Rile, MD  PRIMARY CARDIOLOGIST: Dorris Carnes, MD & Ena Dawley, MD  CT Surgeon: Dr. Roxy Manns  PATIENT: Kevin English is a 58 y.o. male known rheumatic heart disease with mitral stenosis referred for right heart catheterization as part of evaluation for preop mitral valve replacement. His recent trouble with atrial fibrillation with heart failure therefore is being sent for valve replacement as opposed to balloon valvuloplasty.  PRE-OPERATIVE DIAGNOSIS:  Moderate-severe marked stenosis  Worsening heart failure symptoms  Atrial fibrillation, recurrent PROCEDURES PERFORMED:  Right & Left Heart Catheterization with Native Coronary Angiography via Right Radial Artery & Right Common Femoral Vein Access. PROCEDURE: The patient was brought to the [facility] in the fasting state  and prepped and draped in the usual sterile fashion for right radial and/or femoral arterial or radial access. A modified Gurshan's test was performed on the right wrist demonstrating excellent collateral flow. Sterile technique was used including antiseptics, cap, gloves, gown, hand hygiene, mask and sheet. Skin prep: Chlorhexidine.  Consent: Risks of procedure as well as the alternatives and risks of each were explained to the (patient/caregiver). Consent for procedure obtained.  Time Out: Verified patient identification, verified procedure, site/side was marked, verified correct patient position, special equipment/implants available, medications/allergies/relevent history reviewed, required imaging and test results available. Performed.  Access:  Initial attempts were made to exchange the existing 18-gauge right antecubital IV over a wire for a 6 Fr glide sheath were unsuccessful, the decision was made to abort the brachial approach and obtained femoral venous access.  Right Common Femoral Vein: 7 Fr Sheath - Seldinger Technique. Right Radial Artery: 6 Fr Sheath - modified Seldinger Technique  4500 Units IV Heparin; 10 mL Radial Cocktail Right Heart Catheterization: 7Fr Swan Ganz catheter advanced under fluoroscopy with balloon inflated to the RA, RV, then PCWP-PA for hemodynamic measurement.  Simultaneous FA & PA blood gases checked for SaO2% to calculate FICK CO/CI  Thermodilution Injections performed to calculate CO/CI  Simultaneous PCWP/LV & RV/LV pressures monitored with Angled Pigtail in LV.  Catheter removed completely out of the body with balloon deflated.  Left Heart Catheterization: 5 Fr Catheters advanced or exchanged over a J-wire; TIG 4.0 catheter  advanced first.  LV Hemodynamics: TIG 4.0  Left Coronary Artery Cineangiography: JL43.5Catheter  Right Coronary Artery: JR 4 Catheter  R Femoral Venous Sheath removed in the holding area with manual pressure for hemostasis.  TR Band: 15 mL @  2952  FINDINGS:  Hemodynamics:  Findings:   SaO2%  Pressures mmHg  Mean P  mmHg  EDP  mmHg   Right Atrium   11/9   8   Right Ventricle   31/1   5   Pulmonary Artery  72  39.6  19    PCWP   17/13  13    Central Aortic  91  131/77  99    Left Ventricle   130/0   6          Cardiac Output:   Cardiac Index:    Fick  6.65   3.26    Thermodilution  3.97   1 .95    Left Ventriculography: Deferred  Coronary Anatomy: Left Dominant  Left Main: Large-caliber, dominant vessel that bifurcates into the LAD and Circumflex. Angiographically normal. Short vessel LAD: Large caliber vessel that gives off a very large proximal septal perforator trunk and a major diagonal branch. At this branch point is a roughly 40-50% stenosis. The vessel then continues distally to gives off a second diagonal branch. After the diagonal branch the vessel then tapers to a very small-caliber vessel the apex. He barely reaches the apex. After D2 there appears to be a focal lesion although this was not very well visualized. Probably 50-60%.  D1: Moderate caliber proximal vessel it covers the anterolateral wall. Minimal luminal irregularities with several branches.  D2: Moderate caliber vessel, mid LAD, minimal luminal irregularities. At this point the vessel is actually larger than the follow on LAD. Left Circumflex: Very large caliber, dominant vessel. It gives rise to a very small marginal branch that runs like a ramus intermedius with diffuse disease including a proximal 70-80% stenosis. This is not amenable to PCI or CABG. There is an another large lateral OM before the vessel courses into the AV groove giving rise to 2 small posterior lateral branches and the posterior descending artery. Just after the posterior lateral branches going into the PDA there is a focal irregular 70% stenosis.  OM1: Large-caliber, lateral branch mild luminal irregularities. This vessel courses down to the inferolateral apex.  LPDA: Moderate  large-caliber vessel with a proximal disease noted above. Beyond that has mild diffuse luminal irregularities of 10-20%. It reaches almost to the apex. RCA: Small caliber, nondominant vessel. There is a separate ostium for the SA nodal artery to the main RCA has a early mid 30-40% stenosis that branches into 2 smaller marginal branches.  MEDICATIONS:  Anesthesia: Local Lidocaine 2 mL for radial, 12 ml for femoral vein Sedation: 3 mg IV Versed, 75 mcg IV fentanyl ;  Omnipaque Contrast: 80 ml  Anticoagulation: IV Heparin 4500 Units ; Radial Cocktail: 5 mg Verapamil, 400 mcg NTG, 2 ml 2% Lidocaine in 10 ml NS PATIENT DISPOSITION:  The patient was transferred to the PACU holding area in a hemodynamicaly stable, chest pain free condition.  The patient tolerated the procedure well, and there were no complications. EBL: < 10 ml  The patient was stable before, during, and after the procedure. POST-OPERATIVE DIAGNOSIS:  Known moderate stenosis. Gradient estimated of 5.8 mmHg by catheter  Conflicting cardiac outputs between Fick and thermodilution. Likely related to mitral valve disease  Moderate CAD with most significant being at his 70%  distal circumflex-proximal LPDA Asian. Also tandem moderate lesions in the LAD of unclear significance, with a small downstream vessel.Marland Kitchen PLAN OF CARE:  The patient will be admitted for planned inpatient valve surgery  I will review the films with Dr. Roxy Manns to determine whether or not he feels that CABG is warranted.  Continue home medications. Leonie Man, M.D., M.S.  [facility]  [address] [place], [place] [address]  [phone]  08/28/2013  3:00 PM   Ct Angio Abd/pel W/ And/or W/o  08/25/2013   CLINICAL DATA:  Preop, mitral valve replacement. Rule out aortic dissection.  EXAM: CT ANGIOGRAPHY CHEST, ABDOMEN AND PELVIS  TECHNIQUE: Multidetector CT imaging through the chest, abdomen and pelvis was performed using the  standard protocol during bolus administration of intravenous contrast. Multiplanar reconstructed images and MIPs were obtained and reviewed to evaluate the vascular anatomy.  CONTRAST:  56mL OMNIPAQUE IOHEXOL 350 MG/ML SOLN  COMPARISON:  None.  FINDINGS: CTA CHEST FINDINGS  No evidence of intramural hematoma on the precontrast images. Mild atherosclerotic calcifications are present. Minimal calcification in the LAD and circumflex coronary arteries.  No evidence of aortic aneurysm or aortic dissection. Innominate artery, right subclavian artery, right common carotid artery, right vertebral artery, left common carotid artery left subclavian artery, and left vertebral artery are all widely patent within the confines of the exam.  Left atrium is markedly enlarged consistent with given history of mitral valve malfunction. No evidence of abnormal mediastinal adenopathy.  Clear lungs.  No acute bony deformity.  Review of the MIP images confirms the above findings.  CTA ABDOMEN AND PELVIS FINDINGS  Aorta is non aneurysmal and patent without evidence of dissection. Mild atherosclerotic plaque in the infrarenal aorta.  Separate takeoff for the splenic artery is widely patent. This leads to the left gastric branch.  Common trunk for the common hepatic artery and SMA. This is a completely replaced common hepatic artery from the SMA, a normal variation. Branch vessels are patent.  IMA is diminutive and patent.  Branch vessels are patent.  Single renal arteries are patent.  Diffuse atherosclerotic plaque in the right common iliac artery resulting in 50% caliber narrowing. Mild narrowing in the proximal right external iliac artery. It is there after patent. Right internal iliac artery is patent.  Mild plaque in the left common iliac artery. Mild narrowing in the proximal left external iliac artery. It is there after patent. Left internal iliac artery is patent.  3.3 cm heterogeneously enhancing mass in the lower pole of the right  kidney worrisome for renal cell carcinoma. Renal cysts are present. Tiny angiomyolipoma in the upper pole of the left kidney.  Liver, gallbladder, spleen, pancreas, adrenal glands are within normal limits.  No evidence of abnormal retroperitoneal adenopathy by measurement criteria. Tiny pericaval nodes adjacent of the right kidney are present.  No free-fluid.  Normal appendix. Sigmoid diverticulosis without acute diverticulitis  Very large bilateral inguinal hernias containing adipose tissue.  Bladder and prostate are within normal limits.  No acute bony deformity.  Review of the MIP images confirms the above findings.  IMPRESSION: No evidence of thoracic aortic dissection or intramural hematoma.  No acute thoracic pathology.  No evidence of dissection within the abdominal aorta. Infrarenal atherosclerotic plaque is present.  Diffuse disease in the right common iliac artery resulting in 50% narrowing of the caliber. Non significant disease of the remainder of the iliac vasculature.  3.3 cm mass in the lower pole of the  right kidney worrisome for renal cell carcinoma.  Large bilateral inguinal hernias containing adipose tissue.   Electronically Signed   By: Maryclare Bean M.D.   On: 08/25/2013 14:49    Discharge Medications   Current Discharge Medication List    CONTINUE these medications which have CHANGED   Details  amiodarone (PACERONE) 400 MG tablet Take 1 tablet (400 mg total) by mouth daily.    enoxaparin (LOVENOX) 100 MG/ML injection Inject 0.9 mLs (90 mg total) into the skin every 12 (twelve) hours. Qty: 13 Syringe, Refills: 0      CONTINUE these medications which have NOT CHANGED   Details  diltiazem (CARDIZEM CD) 120 MG 24 hr capsule Take 1 capsule (120 mg total) by mouth daily.     levothyroxine (SYNTHROID, LEVOTHROID) 50 MCG tablet Take 50 mcg by mouth daily.       STOP taking these medications     furosemide (LASIX) 40 MG tablet      potassium chloride SA (K-DUR,KLOR-CON) 20 MEQ  tablet      warfarin (COUMADIN) 5 MG tablet         Disposition   The patient will be discharged in stable condition to home. Discharge Instructions   Diet - low sodium heart healthy    Complete by:  As directed      Increase activity slowly    Complete by:  As directed   No driving for 2 days. No lifting over 5 lbs for 1 week. No sexual activity for 1 week. Keep procedure site clean & dry. If you notice increased pain, swelling, bleeding or pus, call/return!  You may shower, but no soaking baths/hot tubs/pools for 1 week.   Decrease amiodarone to once per day. Do not restart Coumadin for now. This will be addressed after yor surgery.  Your last dose of Lovenox should be THE NIGHT BEFORE YOUR SURGERY. Please stop your Lasix and potassium for now. If you develop symptoms of shortness of breath or heart failure, call Dr. Francesca Oman office.          Follow-up Information   Follow up with Sharyn Creamer, MD. [facility] will be calling you with instructions about your surgery on 09/05/13. Dr. Francesca Oman team will be following you while you are admitted as well.)    Specialty:  Urology   Contact information:   [place] [address] [phone]       Follow up with [facility]. (We would like to recheck your kidney function to make sure it stays stable post-catheterization. Please come in anytime between 8am-4pm on Thursday 08/31/13 to have this drawn. )    Specialty:  Cardiology   Contact information:   [address] [place] [address] [phone]      Follow up with Dorothy Spark, MD. (We have moved your appointment from tomorrow to 09/21/13 at 11:30am. (Please call us if you need to be seen sooner.))    Specialty:  Cardiology   Contact information:   [address] [place] [address] [phone]       Follow up with Gilford Rile, MD. (Your white blood cell count remains mildly elevated, with  no obvious indication of any infections. This may be related to your underlying heart/kidney issues, but should be monitored by your primary doctor - please call him to discuss. )    Specialty:  Internal Medicine   Contact information:   [address] [place] [address] [phone]       Follow up with Gilford Rile, MD. (Your blood sugar was somewhat elevated in the hospital. Please discuss with primary doctor.)    Specialty:  Internal Medicine   Contact information:   [place] [address] [phone]         Duration of Discharge Encounter: Greater than 30 minutes including physician and PA time.  Signed, Charlie Pitter PA-C 08/29/2013, 12:03 PM

## 2013-08-30 ENCOUNTER — Other Ambulatory Visit: Payer: Self-pay | Admitting: Urology

## 2013-08-30 ENCOUNTER — Encounter (HOSPITAL_COMMUNITY): Payer: Self-pay | Admitting: Pharmacy Technician

## 2013-08-30 ENCOUNTER — Encounter: Payer: Self-pay | Admitting: Physician Assistant

## 2013-08-30 ENCOUNTER — Ambulatory Visit: Payer: Managed Care, Other (non HMO) | Admitting: Cardiology

## 2013-08-30 SURGERY — REPAIR, MITRAL VALVE, MINIMALLY INVASIVE
Anesthesia: General | Site: Chest | Laterality: Right

## 2013-08-31 ENCOUNTER — Other Ambulatory Visit: Payer: Managed Care, Other (non HMO)

## 2013-09-01 ENCOUNTER — Encounter (HOSPITAL_COMMUNITY): Payer: Self-pay

## 2013-09-01 ENCOUNTER — Encounter (HOSPITAL_COMMUNITY)
Admission: RE | Admit: 2013-09-01 | Discharge: 2013-09-01 | Disposition: A | Discharge status: Home / Self Care | Payer: Managed Care, Other (non HMO) | Source: Ambulatory Visit | Attending: Urology | Admitting: Urology

## 2013-09-01 ENCOUNTER — Encounter (HOSPITAL_COMMUNITY): Payer: Self-pay | Admitting: Anesthesiology

## 2013-09-01 ENCOUNTER — Other Ambulatory Visit (INDEPENDENT_AMBULATORY_CARE_PROVIDER_SITE_OTHER): Payer: Managed Care, Other (non HMO)

## 2013-09-01 DIAGNOSIS — Z01812 Encounter for preprocedural laboratory examination: Secondary | ICD-10-CM | POA: Insufficient documentation

## 2013-09-01 DIAGNOSIS — Z01818 Encounter for other preprocedural examination: Secondary | ICD-10-CM | POA: Insufficient documentation

## 2013-09-01 DIAGNOSIS — N182 Chronic kidney disease, stage 2 (mild): Secondary | ICD-10-CM

## 2013-09-01 HISTORY — DX: Cerebral infarction, unspecified: I63.9

## 2013-09-01 HISTORY — DX: Anxiety disorder, unspecified: F41.9

## 2013-09-01 HISTORY — DX: Major depressive disorder, single episode, unspecified: F32.9

## 2013-09-01 HISTORY — DX: Unspecified hearing loss, left ear: H91.92

## 2013-09-01 HISTORY — DX: Prediabetes: R73.03

## 2013-09-01 HISTORY — DX: Depression, unspecified: F32.A

## 2013-09-01 LAB — BASIC METABOLIC PANEL
BUN: 10 mg/dL (ref 6–23)
CALCIUM: 9.8 mg/dL (ref 8.4–10.5)
CO2: 26 meq/L (ref 19–32)
CREATININE: 1.3 mg/dL (ref 0.4–1.5)
Chloride: 107 mEq/L (ref 96–112)
GFR: 59.79 mL/min — ABNORMAL LOW (ref >60.00)
GLUCOSE: 83 mg/dL (ref 70–99)
Potassium: 4.2 mEq/L (ref 3.5–5.1)
Sodium: 139 mEq/L (ref 135–145)

## 2013-09-01 NOTE — Progress Notes (Signed)
EKG 08/28/13 on [facility], CT angio 08/25/13 on [facility], Chest x-ray 08/18/13 on EP[facility], CBC with diff 08/29/13 on [facility], BMET 09/01/13 on EP[facility], PT/INR 08/28/13 on EP[facility]

## 2013-09-01 NOTE — Anesthesia Preprocedure Evaluation (Addendum)
Anesthesia Evaluation  Patient identified by MRN, date of birth, ID band Patient awake  General Assessment Comment: Hernia     .  Chronic rhinitis     .  Diverticulitis of colon  15 years ago   .  Hyperlipidemia     .  Hypogonadism male     .  Leukocytosis     .  Myalgia and myositis, unspecified     .  Obesity     .  OSA on CPAP     .  Hypothyroidism     .  PAF (paroxysmal atrial fibrillation)         a. sustained 160-180 on e-CARDIO monitor placed 07/29/2013. Placed on IV amiodarone at end of May 2015 due to continued paroxysms with RVR.   Marland Kitchen  Rheumatic heart disease     .  Chronic diastolic CHF (congestive heart failure)         a. Occurs in context of AF-RVR with mitral stenosis.   .  Mitral stenosis         a. Dx 07/2013 - through workup of 2D echo, TEE, and cath, felt to be moderate.   .  Paroxysmal atrial flutter     .  CKD (chronic kidney disease), stage II     .  CAD (coronary artery disease)         a. By cath 08/2013: 70% distal cx-prox LPDA; tandem mod LAD lesions, o/w mild dz.   .  Renal mass         a. Dx 08/2013: concerning for renal cancer.   Marland Kitchen  HTN (hypertension)         hx of- under control   .  Stroke  1998       "MRI showed 3 mild strokes"   .  Borderline diabetes     .  Depression     .  Anxiety     .  Dizzy spells     .  Deafness in left ear       Reviewed: Allergy & Precautions, H&P , NPO status , Patient's Chart, lab work & pertinent test results  Airway Mallampati: II TM Distance: >3 FB Neck ROM: Full    Dental no notable dental hx.    Pulmonary shortness of breath and with exertion, sleep apnea and Continuous Positive Airway Pressure Ventilation , Current Smoker,  breath sounds clear to auscultation  Pulmonary exam normal       Cardiovascular hypertension, Pt. on medications + CAD and +CHF + dysrhythmias Atrial Fibrillation + Valvular Problems/Murmurs Rhythm:Regular Rate:Normal  He has been  evaluated by cardiology, Dr. Roxy Manns and Dr. Jasmine December, with opinions by Dr. Delton Coombes partners also. All feel he should undergo nephrectomy first before treatment of moderate to severe Mitral Stenosis.   Neuro/Psych PSYCHIATRIC DISORDERS Anxiety Depression  Neuromuscular disease CVA, No Residual Symptoms    GI/Hepatic negative GI ROS, Neg liver ROS,   Endo/Other  Hypothyroidism   Renal/GU Renal InsufficiencyRenal disease  negative genitourinary   Musculoskeletal negative musculoskeletal ROS (+)   Abdominal (+) + obese,   Peds negative pediatric ROS (+)  Hematology negative hematology ROS (+)   Anesthesia Other Findings   Reproductive/Obstetrics negative OB ROS                        Anesthesia Physical Anesthesia Plan  ASA: III  Anesthesia Plan: General   Post-op Pain Management:    Induction: Intravenous  Airway  Management Planned: Oral ETT  Additional Equipment: Arterial line  Intra-op Plan:   Post-operative Plan: Extubation in OR  Informed Consent: I have reviewed the patients History and Physical, chart, labs and discussed the procedure including the risks, benefits and alternatives for the proposed anesthesia with the patient or authorized representative who has indicated his/her understanding and acceptance.   Dental advisory given  Plan Discussed with: CRNA  Anesthesia Plan Comments: (Difficult situation with newly discovered renal mass and recent diagnosis of moderate to severe MS and atrial fibrillation. Afib has not been a problem since addition of amiodarone about three weeks ago. Plan to apply defibrillation pads preoperatively. Discussed with patient and patient's wife the situation and risks of proceeding with nephrectomy before addressing the mitral stenosis including reemergence of atrial fibrillation, need for cardioversion, need to abort procedure, heart failure, and death.)      Anesthesia Quick Evaluation

## 2013-09-01 NOTE — Patient Instructions (Addendum)
18 Kevin English  09/01/2013   Your procedure is scheduled on: Tuesday 09/05/13  Report to [facility] at 09:45 AM.  Call this number if you have problems the morning of surgery [phone]: [phone]   Remember:   Do not eat food or drink liquids After Midnight.     Take these medicines the morning of surgery with A SIP OF WATER: amiodarone, diltiazem, synthroid   Do not wear jewelry, make-up or nail polish.  Do not wear lotions, powders, or perfumes. You may wear deodorant.  Do not shave 48 hours prior to surgery. Men may shave face and neck.  Do not bring valuables to the hospital.  Contacts, dentures or bridgework may not be worn into surgery.  Leave suitcase in the car. After surgery it may be brought to your room.  For patients admitted to the hospital, checkout time is 11:00 AM the day of discharge.   Paulette Blanch, RN  pre op nurse call if needed [phone]    [facility] - Preparing for Surgery Before surgery, you can play an important role.  Because skin is not sterile, your skin needs to be as free of germs as possible.  You can reduce the number of germs on your skin by washing with CHG (chlorahexidine gluconate) soap before surgery.  CHG is an antiseptic cleaner which kills germs and bonds with the skin to continue killing germs even after washing. Please DO NOT use if you have an allergy to CHG or antibacterial soaps.  If your skin becomes reddened/irritated stop using the CHG and inform your nurse when you arrive at Short Stay. Do not shave (including legs and underarms) for at least 48 hours prior to the first CHG shower.  You may shave your face/neck. Please follow these instructions carefully:  1.  Shower with CHG Soap the night before surgery and the  morning of Surgery.  2.  If you choose to wash your hair, wash your hair first as usual with your  normal  shampoo.  3.  After you shampoo, rinse your hair and body thoroughly to remove the  shampoo.                             4.  Use CHG as you would any other liquid soap.  You can apply chg directly  to the skin and wash                       Gently with a scrungie or clean washcloth.  5.  Apply the CHG Soap to your body ONLY FROM THE NECK DOWN.   Do not use on face/ open                           Wound or open sores. Avoid contact with eyes, ears mouth and genitals (private parts).                       Wash face,  Genitals (private parts) with your normal soap.             6.  Wash thoroughly, paying special attention to the area where your surgery  will be performed.  7.  Thoroughly rinse your body with warm water from the neck down.  8.  DO NOT shower/wash with your normal soap after using and rinsing off  the CHG Soap.                9.  Pat yourself dry with a clean towel.            10.  Wear clean pajamas.            11.  Place clean sheets on your bed the night of your first shower and do not  sleep with pets. Day of Surgery : Do not apply any lotions/deodorants the morning of surgery.  Please wear clean clothes to the hospital/surgery center.  FAILURE TO FOLLOW THESE INSTRUCTIONS MAY RESULT IN THE CANCELLATION OF YOUR SURGERY PATIENT SIGNATURE_________________________________  NURSE SIGNATURE__________________________________  ________________________________________________________________________    CLEAR LIQUID DIET   Foods Allowed                                                                     Foods Excluded  Coffee and tea, regular and decaf                             liquids that you cannot  Plain Jell-O in any flavor                                             see through such as: Fruit ices (not with fruit pulp)                                     milk, soups, orange juice  Iced Popsicles                                    All solid food Carbonated beverages, regular and diet                                    Cranberry, grape and apple juices Sports drinks  like Gatorade Lightly seasoned clear broth or consume(fat free) Sugar, honey syrup  Sample Menu Breakfast                                Lunch                                     Supper Cranberry juice                    Beef broth                            Chicken broth Jell-O  Grape juice                           Apple juice Coffee or tea                        Jell-O                                      Popsicle                                                Coffee or tea                        Coffee or tea  _____________________________________________________________________

## 2013-09-01 NOTE — Progress Notes (Deleted)
09/01/13 1426  OBSTRUCTIVE SLEEP APNEA  Have you ever been diagnosed with sleep apnea through a sleep study? No  Do you snore loudly (loud enough to be heard through closed doors)?  1  Do you often feel tired, fatigued, or sleepy during the daytime? 0  Has anyone observed you stop breathing during your sleep? 0  Do you have, or are you being treated for high blood pressure? 1  BMI more than 35 kg/m2? 0  Age over 60 years old? 1  Neck circumference greater than 40 cm/16 inches? 0  Gender: 1  Obstructive Sleep Apnea Score 4  Score 4 or greater  Results sent to PCP

## 2013-09-05 ENCOUNTER — Encounter (HOSPITAL_COMMUNITY)
Admission: RE | Disposition: A | Discharge status: Home / Self Care | Payer: Self-pay | Source: Ambulatory Visit | Attending: Urology

## 2013-09-05 ENCOUNTER — Encounter (HOSPITAL_COMMUNITY): Payer: Self-pay | Admitting: *Deleted

## 2013-09-05 ENCOUNTER — Inpatient Hospital Stay (HOSPITAL_COMMUNITY): Payer: Managed Care, Other (non HMO) | Admitting: Anesthesiology

## 2013-09-05 ENCOUNTER — Inpatient Hospital Stay (HOSPITAL_COMMUNITY)
Admission: RE | Admit: 2013-09-05 | Discharge: 2013-09-08 | DRG: 657 | Disposition: A | Discharge status: Home / Self Care | Payer: Managed Care, Other (non HMO) | Source: Ambulatory Visit | Attending: Urology | Admitting: Urology

## 2013-09-05 ENCOUNTER — Encounter (HOSPITAL_COMMUNITY): Payer: Managed Care, Other (non HMO) | Admitting: Anesthesiology

## 2013-09-05 DIAGNOSIS — C641 Malignant neoplasm of right kidney, except renal pelvis: Secondary | ICD-10-CM

## 2013-09-05 DIAGNOSIS — E039 Hypothyroidism, unspecified: Secondary | ICD-10-CM | POA: Diagnosis present

## 2013-09-05 DIAGNOSIS — E785 Hyperlipidemia, unspecified: Secondary | ICD-10-CM | POA: Diagnosis present

## 2013-09-05 DIAGNOSIS — I050 Rheumatic mitral stenosis: Secondary | ICD-10-CM

## 2013-09-05 DIAGNOSIS — D49519 Neoplasm of unspecified behavior of unspecified kidney: Secondary | ICD-10-CM | POA: Diagnosis present

## 2013-09-05 DIAGNOSIS — N182 Chronic kidney disease, stage 2 (mild): Secondary | ICD-10-CM | POA: Diagnosis present

## 2013-09-05 DIAGNOSIS — I4891 Unspecified atrial fibrillation: Secondary | ICD-10-CM

## 2013-09-05 DIAGNOSIS — I480 Paroxysmal atrial fibrillation: Secondary | ICD-10-CM

## 2013-09-05 DIAGNOSIS — I509 Heart failure, unspecified: Secondary | ICD-10-CM

## 2013-09-05 DIAGNOSIS — E669 Obesity, unspecified: Secondary | ICD-10-CM | POA: Diagnosis present

## 2013-09-05 DIAGNOSIS — E291 Testicular hypofunction: Secondary | ICD-10-CM | POA: Diagnosis present

## 2013-09-05 DIAGNOSIS — F17200 Nicotine dependence, unspecified, uncomplicated: Secondary | ICD-10-CM | POA: Diagnosis present

## 2013-09-05 DIAGNOSIS — I5032 Chronic diastolic (congestive) heart failure: Secondary | ICD-10-CM | POA: Diagnosis present

## 2013-09-05 DIAGNOSIS — Z6830 Body mass index (BMI) 30.0-30.9, adult: Secondary | ICD-10-CM

## 2013-09-05 DIAGNOSIS — C649 Malignant neoplasm of unspecified kidney, except renal pelvis: Principal | ICD-10-CM | POA: Diagnosis present

## 2013-09-05 DIAGNOSIS — Z7901 Long term (current) use of anticoagulants: Secondary | ICD-10-CM

## 2013-09-05 DIAGNOSIS — I129 Hypertensive chronic kidney disease with stage 1 through stage 4 chronic kidney disease, or unspecified chronic kidney disease: Secondary | ICD-10-CM | POA: Diagnosis present

## 2013-09-05 DIAGNOSIS — G4733 Obstructive sleep apnea (adult) (pediatric): Secondary | ICD-10-CM | POA: Diagnosis present

## 2013-09-05 DIAGNOSIS — I2510 Atherosclerotic heart disease of native coronary artery without angina pectoris: Secondary | ICD-10-CM | POA: Diagnosis present

## 2013-09-05 DIAGNOSIS — N179 Acute kidney failure, unspecified: Secondary | ICD-10-CM | POA: Diagnosis not present

## 2013-09-05 DIAGNOSIS — I5033 Acute on chronic diastolic (congestive) heart failure: Secondary | ICD-10-CM

## 2013-09-05 DIAGNOSIS — G4730 Sleep apnea, unspecified: Secondary | ICD-10-CM

## 2013-09-05 DIAGNOSIS — I099 Rheumatic heart disease, unspecified: Secondary | ICD-10-CM | POA: Diagnosis present

## 2013-09-05 HISTORY — DX: Malignant neoplasm of right kidney, except renal pelvis: C64.1

## 2013-09-05 HISTORY — PX: ROBOT ASSISTED LAPAROSCOPIC NEPHRECTOMY: SHX5140

## 2013-09-05 LAB — CBC
HEMATOCRIT: 49.3 % (ref 39.0–52.0)
HEMOGLOBIN: 16.8 g/dL (ref 13.0–17.0)
MCH: 32.2 pg (ref 26.0–34.0)
MCHC: 34.1 g/dL (ref 30.0–36.0)
MCV: 94.4 fL (ref 78.0–100.0)
Platelets: 248 10*3/uL (ref 150–400)
RBC: 5.22 MIL/uL (ref 4.22–5.81)
RDW: 14.3 % (ref 11.5–15.5)
WBC: 27.9 10*3/uL — ABNORMAL HIGH (ref 4.0–10.5)

## 2013-09-05 LAB — TYPE AND SCREEN
ABO/RH(D): O NEG
Antibody Screen: NEGATIVE

## 2013-09-05 LAB — CREATININE, SERUM
Creatinine, Ser: 1.73 mg/dL — ABNORMAL HIGH (ref 0.50–1.35)
GFR calc Af Amer: 48 mL/min — ABNORMAL LOW (ref >90)
GFR calc non Af Amer: 41 mL/min — ABNORMAL LOW (ref >90)

## 2013-09-05 LAB — MRSA PCR SCREENING: MRSA by PCR: NEGATIVE

## 2013-09-05 LAB — HEMOGLOBIN AND HEMATOCRIT, BLOOD
HCT: 50 % (ref 39.0–52.0)
Hemoglobin: 16.8 g/dL (ref 13.0–17.0)

## 2013-09-05 LAB — ABO/RH: ABO/RH(D): O NEG

## 2013-09-05 SURGERY — ROBOTIC ASSISTED LAPAROSCOPIC NEPHRECTOMY
Anesthesia: General | Laterality: Right

## 2013-09-05 MED ORDER — SODIUM CHLORIDE 0.9 % IV SOLN
20.0000 mg | INTRAVENOUS | Status: DC | PRN
  Administered 2013-09-05: 50 ug/min via INTRAVENOUS

## 2013-09-05 MED ORDER — SUFENTANIL CITRATE 50 MCG/ML IV SOLN
INTRAVENOUS | Status: AC
  Filled 2013-09-05: qty 1

## 2013-09-05 MED ORDER — LEVOTHYROXINE SODIUM 50 MCG PO TABS
50.0000 ug | ORAL_TABLET | Freq: Every day | ORAL | Status: DC
  Administered 2013-09-06 – 2013-09-08 (×3): 50 ug via ORAL
  Filled 2013-09-05 (×4): qty 1

## 2013-09-05 MED ORDER — ONDANSETRON HCL 4 MG/2ML IJ SOLN
INTRAMUSCULAR | Status: AC
  Filled 2013-09-05: qty 2

## 2013-09-05 MED ORDER — ONDANSETRON HCL 4 MG/2ML IJ SOLN
INTRAMUSCULAR | Status: DC | PRN
  Administered 2013-09-05: 4 mg via INTRAVENOUS

## 2013-09-05 MED ORDER — LIDOCAINE HCL (CARDIAC) 20 MG/ML IV SOLN
INTRAVENOUS | Status: AC
  Filled 2013-09-05: qty 5

## 2013-09-05 MED ORDER — PROPOFOL 10 MG/ML IV BOLUS
INTRAVENOUS | Status: AC
  Filled 2013-09-05: qty 20

## 2013-09-05 MED ORDER — CEFAZOLIN SODIUM-DEXTROSE 2-3 GM-% IV SOLR
INTRAVENOUS | Status: AC
  Filled 2013-09-05: qty 50

## 2013-09-05 MED ORDER — GLYCOPYRROLATE 0.2 MG/ML IJ SOLN
INTRAMUSCULAR | Status: DC | PRN
  Administered 2013-09-05: .6 mg via INTRAVENOUS

## 2013-09-05 MED ORDER — HYDROMORPHONE HCL PF 1 MG/ML IJ SOLN: 0.2500 mg | INTRAMUSCULAR | Status: DC | PRN

## 2013-09-05 MED ORDER — HYDROMORPHONE HCL PF 2 MG/ML IJ SOLN
INTRAMUSCULAR | Status: AC
  Filled 2013-09-05: qty 1

## 2013-09-05 MED ORDER — PROMETHAZINE HCL 25 MG/ML IJ SOLN: 6.2500 mg | INTRAMUSCULAR | Status: DC | PRN

## 2013-09-05 MED ORDER — CISATRACURIUM BESYLATE (PF) 10 MG/5ML IV SOLN
INTRAVENOUS | Status: DC | PRN
  Administered 2013-09-05: 12 mg via INTRAVENOUS
  Administered 2013-09-05: 4 mg via INTRAVENOUS
  Administered 2013-09-05: 8 mg via INTRAVENOUS

## 2013-09-05 MED ORDER — CISATRACURIUM BESYLATE 20 MG/10ML IV SOLN
INTRAVENOUS | Status: AC
  Filled 2013-09-05: qty 10

## 2013-09-05 MED ORDER — NEOSTIGMINE METHYLSULFATE 10 MG/10ML IV SOLN
INTRAVENOUS | Status: DC | PRN
  Administered 2013-09-05: 4 mg via INTRAVENOUS

## 2013-09-05 MED ORDER — MORPHINE SULFATE 2 MG/ML IJ SOLN
2.0000 mg | INTRAMUSCULAR | Status: DC | PRN
  Administered 2013-09-07 – 2013-09-08 (×2): 4 mg via INTRAVENOUS
  Filled 2013-09-05 (×2): qty 2

## 2013-09-05 MED ORDER — AMIODARONE HCL 200 MG PO TABS
400.0000 mg | ORAL_TABLET | Freq: Every morning | ORAL | Status: DC
  Administered 2013-09-06 – 2013-09-07 (×2): 400 mg via ORAL
  Filled 2013-09-05 (×3): qty 2

## 2013-09-05 MED ORDER — OXYCODONE HCL 5 MG PO TABS
5.0000 mg | ORAL_TABLET | ORAL | Status: DC | PRN
  Administered 2013-09-05 – 2013-09-08 (×6): 10 mg via ORAL
  Filled 2013-09-05 (×6): qty 2

## 2013-09-05 MED ORDER — SODIUM CHLORIDE 0.9 % IJ SOLN
INTRAMUSCULAR | Status: AC
  Filled 2013-09-05: qty 20

## 2013-09-05 MED ORDER — BUPIVACAINE-EPINEPHRINE (PF) 0.25% -1:200000 IJ SOLN
INTRAMUSCULAR | Status: AC
  Filled 2013-09-05: qty 30

## 2013-09-05 MED ORDER — HEPARIN SODIUM (PORCINE) 5000 UNIT/ML IJ SOLN
5000.0000 [IU] | Freq: Three times a day (TID) | INTRAMUSCULAR | Status: DC
  Administered 2013-09-05 – 2013-09-07 (×7): 5000 [IU] via SUBCUTANEOUS
  Filled 2013-09-05 (×11): qty 1

## 2013-09-05 MED ORDER — SUCCINYLCHOLINE CHLORIDE 20 MG/ML IJ SOLN
INTRAMUSCULAR | Status: DC | PRN
  Administered 2013-09-05: 100 mg via INTRAVENOUS

## 2013-09-05 MED ORDER — MIDAZOLAM HCL 2 MG/2ML IJ SOLN
INTRAMUSCULAR | Status: AC
  Filled 2013-09-05: qty 2

## 2013-09-05 MED ORDER — PHENYLEPHRINE HCL 10 MG/ML IJ SOLN
INTRAMUSCULAR | Status: AC
  Filled 2013-09-05: qty 1

## 2013-09-05 MED ORDER — SODIUM CHLORIDE 0.9 % IJ SOLN
INTRAMUSCULAR | Status: AC
  Filled 2013-09-05: qty 10

## 2013-09-05 MED ORDER — EPHEDRINE SULFATE 50 MG/ML IJ SOLN
INTRAMUSCULAR | Status: AC
  Filled 2013-09-05: qty 1

## 2013-09-05 MED ORDER — HEPARIN SODIUM (PORCINE) 5000 UNIT/ML IJ SOLN
5000.0000 [IU] | INTRAMUSCULAR | Status: AC
  Administered 2013-09-05: 5000 [IU] via SUBCUTANEOUS
  Filled 2013-09-05: qty 1

## 2013-09-05 MED ORDER — LACTATED RINGERS IV SOLN
INTRAVENOUS | Status: DC
  Administered 2013-09-05: 11:00:00 via INTRAVENOUS

## 2013-09-05 MED ORDER — MIDAZOLAM HCL 5 MG/5ML IJ SOLN
INTRAMUSCULAR | Status: DC | PRN
  Administered 2013-09-05 (×2): 1 mg via INTRAVENOUS

## 2013-09-05 MED ORDER — DILTIAZEM HCL ER COATED BEADS 120 MG PO CP24
120.0000 mg | ORAL_CAPSULE | Freq: Every morning | ORAL | Status: DC
  Administered 2013-09-06 – 2013-09-07 (×2): 120 mg via ORAL
  Filled 2013-09-05 (×3): qty 1

## 2013-09-05 MED ORDER — DILTIAZEM HCL 100 MG IV SOLR
5.0000 mg/h | INTRAVENOUS | Status: DC
  Filled 2013-09-05: qty 100

## 2013-09-05 MED ORDER — ETOMIDATE 2 MG/ML IV SOLN
INTRAVENOUS | Status: AC
  Filled 2013-09-05: qty 10

## 2013-09-05 MED ORDER — LACTATED RINGERS IV SOLN
INTRAVENOUS | Status: DC
  Administered 2013-09-05 (×2): via INTRAVENOUS

## 2013-09-05 MED ORDER — PHENYLEPHRINE 40 MCG/ML (10ML) SYRINGE FOR IV PUSH (FOR BLOOD PRESSURE SUPPORT)
PREFILLED_SYRINGE | INTRAVENOUS | Status: AC
  Filled 2013-09-05: qty 10

## 2013-09-05 MED ORDER — ONDANSETRON HCL 4 MG/2ML IJ SOLN: 4.0000 mg | INTRAMUSCULAR | Status: DC | PRN

## 2013-09-05 MED ORDER — CEFAZOLIN SODIUM-DEXTROSE 2-3 GM-% IV SOLR
2.0000 g | Freq: Once | INTRAVENOUS | Status: AC
  Administered 2013-09-05: 2 g via INTRAVENOUS

## 2013-09-05 MED ORDER — HYDROMORPHONE HCL PF 1 MG/ML IJ SOLN
INTRAMUSCULAR | Status: DC | PRN
  Administered 2013-09-05 (×3): .4 mg via INTRAVENOUS

## 2013-09-05 MED ORDER — CEFAZOLIN SODIUM-DEXTROSE 2-3 GM-% IV SOLR
2.0000 g | INTRAVENOUS | Status: AC
  Administered 2013-09-05: 2 g via INTRAVENOUS

## 2013-09-05 MED ORDER — DEXAMETHASONE SODIUM PHOSPHATE 10 MG/ML IJ SOLN
INTRAMUSCULAR | Status: AC
  Filled 2013-09-05: qty 1

## 2013-09-05 MED ORDER — SODIUM CHLORIDE 0.9 % IV SOLN
INTRAVENOUS | Status: DC
  Administered 2013-09-05 – 2013-09-06 (×2): via INTRAVENOUS

## 2013-09-05 MED ORDER — ETOMIDATE 2 MG/ML IV SOLN
INTRAVENOUS | Status: DC | PRN
  Administered 2013-09-05: 18 mg via INTRAVENOUS

## 2013-09-05 MED ORDER — BUPIVACAINE-EPINEPHRINE 0.25% -1:200000 IJ SOLN
INTRAMUSCULAR | Status: DC | PRN
  Administered 2013-09-05: 30 mL

## 2013-09-05 MED ORDER — SENNOSIDES-DOCUSATE SODIUM 8.6-50 MG PO TABS
1.0000 | ORAL_TABLET | Freq: Two times a day (BID) | ORAL | Status: DC
  Administered 2013-09-05 – 2013-09-08 (×6): 1 via ORAL
  Filled 2013-09-05 (×8): qty 1

## 2013-09-05 MED ORDER — DEXAMETHASONE SODIUM PHOSPHATE 10 MG/ML IJ SOLN
INTRAMUSCULAR | Status: DC | PRN
  Administered 2013-09-05: 10 mg via INTRAVENOUS

## 2013-09-05 MED ORDER — SUFENTANIL CITRATE 50 MCG/ML IV SOLN
INTRAVENOUS | Status: DC | PRN
Start: 2013-09-05 — End: 2013-09-05
  Administered 2013-09-05 (×2): 10 ug via INTRAVENOUS
  Administered 2013-09-05: 20 ug via INTRAVENOUS
  Administered 2013-09-05 (×4): 10 ug via INTRAVENOUS

## 2013-09-05 MED ORDER — CEFAZOLIN SODIUM 1-5 GM-% IV SOLN
1.0000 g | Freq: Three times a day (TID) | INTRAVENOUS | Status: AC
  Administered 2013-09-05 – 2013-09-06 (×2): 1 g via INTRAVENOUS
  Filled 2013-09-05 (×3): qty 50

## 2013-09-05 SURGICAL SUPPLY — 87 items
ADH SKN CLS APL DERMABOND .7 (GAUZE/BANDAGES/DRESSINGS)
APL ESCP 34 STRL LF DISP (HEMOSTASIS)
APPLICATOR SURGIFLO ENDO (HEMOSTASIS) IMPLANT
BAG SPEC RTRVL LRG 6X4 10 (ENDOMECHANICALS)
CABLE HIGH FREQUENCY MONO STRZ (ELECTRODE) ×3 IMPLANT
CANNULA SEAL DVNC (CANNULA) IMPLANT
CANNULA SEALS DA VINCI (CANNULA) ×2
CATH FOLEY 2WAY SLVR  5CC 18FR (CATHETERS) ×2
CATH FOLEY 2WAY SLVR 5CC 18FR (CATHETERS) ×1 IMPLANT
CATH URET 5FR 28IN OPEN ENDED (CATHETERS) IMPLANT
CHLORAPREP W/TINT 26ML (MISCELLANEOUS) ×3 IMPLANT
CLIP LIGATING HEM O LOK PURPLE (MISCELLANEOUS) ×5 IMPLANT
CLIP LIGATING HEMO LOK XL GOLD (MISCELLANEOUS) ×1 IMPLANT
CLIP LIGATING HEMO O LOK GREEN (MISCELLANEOUS) ×3 IMPLANT
CORDS BIPOLAR (ELECTRODE) ×3 IMPLANT
COVER SURGICAL LIGHT HANDLE (MISCELLANEOUS) ×3 IMPLANT
COVER TIP SHEARS 8 DVNC (MISCELLANEOUS) ×1 IMPLANT
COVER TIP SHEARS 8MM DA VINCI (MISCELLANEOUS) ×2
CUTTER ECHEON FLEX ENDO 45 340 (ENDOMECHANICALS) ×2 IMPLANT
DECANTER SPIKE VIAL GLASS SM (MISCELLANEOUS) ×1 IMPLANT
DERMABOND ADVANCED (GAUZE/BANDAGES/DRESSINGS)
DERMABOND ADVANCED .7 DNX12 (GAUZE/BANDAGES/DRESSINGS) ×2 IMPLANT
DRAIN CHANNEL 15F RND FF 3/16 (WOUND CARE) ×1 IMPLANT
DRAPE INCISE IOBAN 66X45 STRL (DRAPES) ×3 IMPLANT
DRAPE LAPAROSCOPIC ABDOMINAL (DRAPES) ×3 IMPLANT
DRAPE LG THREE QUARTER DISP (DRAPES) ×6 IMPLANT
DRAPE TABLE BACK 44X90 PK DISP (DRAPES) ×3 IMPLANT
DRAPE UTILITY XL STRL (DRAPES) ×2 IMPLANT
DRAPE WARM FLUID 44X44 (DRAPE) ×3 IMPLANT
ELECT REM PT RETURN 9FT ADLT (ELECTROSURGICAL) ×3
ELECTRODE REM PT RTRN 9FT ADLT (ELECTROSURGICAL) ×2 IMPLANT
EVACUATOR SILICONE 100CC (DRAIN) ×1 IMPLANT
GLOVE BIO SURGEON STRL SZ 6.5 (GLOVE) ×1 IMPLANT
GLOVE BIO SURGEONS STRL SZ 6.5 (GLOVE)
GLOVE BIOGEL M 7.0 STRL (GLOVE) IMPLANT
GLOVE BIOGEL PI IND STRL 7.5 (GLOVE) ×3 IMPLANT
GLOVE BIOGEL PI INDICATOR 7.5 (GLOVE) ×6
GLOVE ECLIPSE 7.0 STRL STRAW (GLOVE) ×6 IMPLANT
GLOVE ECLIPSE 7.5 STRL STRAW (GLOVE) ×3 IMPLANT
GOWN STRL REUS W/TWL LRG LVL3 (GOWN DISPOSABLE) ×6 IMPLANT
GOWN STRL REUS W/TWL XL LVL3 (GOWN DISPOSABLE) ×6 IMPLANT
HEMOSTAT SURGICEL 4X8 (HEMOSTASIS) IMPLANT
KIT ACCESSORY DA VINCI DISP (KITS) ×2
KIT ACCESSORY DVNC DISP (KITS) ×1 IMPLANT
KIT BASIN OR (CUSTOM PROCEDURE TRAY) ×3 IMPLANT
NDL INSUFFLATION 14GA 120MM (NEEDLE) IMPLANT
NEEDLE INSUFFLATION 14GA 120MM (NEEDLE) ×3 IMPLANT
PENCIL BUTTON HOLSTER BLD 10FT (ELECTRODE) ×3 IMPLANT
POSITIONER SURGICAL ARM (MISCELLANEOUS) ×4 IMPLANT
POUCH ENDO CATCH II 15MM (MISCELLANEOUS) ×3 IMPLANT
POUCH SPECIMEN RETRIEVAL 10MM (ENDOMECHANICALS) ×1 IMPLANT
RELOAD BLUE (STAPLE) IMPLANT
RELOAD WH ECHELON 45 (STAPLE) ×10 IMPLANT
RELOAD WHITE ECR60W (STAPLE) ×1 IMPLANT
SCRUB PCMX 4 OZ (MISCELLANEOUS) IMPLANT
SET TUBE IRRIG SUCTION NO TIP (IRRIGATION / IRRIGATOR) ×2 IMPLANT
SOLUTION ANTI FOG 6CC (MISCELLANEOUS) ×3 IMPLANT
SOLUTION ELECTROLUBE (MISCELLANEOUS) ×3 IMPLANT
SPONGE LAP 18X18 X RAY DECT (DISPOSABLE) ×2 IMPLANT
SPONGE LAP 4X18 X RAY DECT (DISPOSABLE) ×1 IMPLANT
STAPLE ECHEON FLEX 60 POW ENDO (STAPLE) ×1 IMPLANT
SURGIFLO W/THROMBIN 8M KIT (HEMOSTASIS) ×1 IMPLANT
SUT ETHILON 3 0 PS 1 (SUTURE) ×1 IMPLANT
SUT MNCRL AB 4-0 PS2 18 (SUTURE) ×6 IMPLANT
SUT MON AB 2-0 SH 27 (SUTURE) ×1 IMPLANT
SUT MON AB 2-0 SH27 (SUTURE) ×1 IMPLANT
SUT PDS AB 1 CTX 36 (SUTURE) ×6 IMPLANT
SUT VIC AB 0 CT1 27 (SUTURE) ×3
SUT VIC AB 0 CT1 27XBRD ANTBC (SUTURE) ×2 IMPLANT
SUT VIC AB 0 UR5 27 (SUTURE) ×1 IMPLANT
SUT VIC AB 1 CT1 27 (SUTURE)
SUT VIC AB 1 CT1 27XBRD ANTBC (SUTURE) ×2 IMPLANT
SUT VIC AB 2-0 SH 27 (SUTURE) ×3
SUT VIC AB 2-0 SH 27X BRD (SUTURE) ×2 IMPLANT
SUT VIC AB 4-0 RB1 27 (SUTURE)
SUT VIC AB 4-0 RB1 27XBRD (SUTURE) ×2 IMPLANT
SUT VICRYL 0 UR6 27IN ABS (SUTURE) ×4 IMPLANT
SYR BULB IRRIGATION 50ML (SYRINGE) IMPLANT
TOWEL OR 17X26 10 PK STRL BLUE (TOWEL DISPOSABLE) ×5 IMPLANT
TOWEL OR NON WOVEN STRL DISP B (DISPOSABLE) ×3 IMPLANT
TRAY FOLEY CATH 14FRSI W/METER (CATHETERS) ×3 IMPLANT
TRAY LAP CHOLE (CUSTOM PROCEDURE TRAY) ×1 IMPLANT
TROCAR 12M 150ML BLUNT (TROCAR) ×4 IMPLANT
TROCAR BLADELESS OPT 5 100 (ENDOMECHANICALS) ×2 IMPLANT
TROCAR XCEL 12X100 BLDLESS (ENDOMECHANICALS) ×4 IMPLANT
TUBING INSUFFLATION 10FT LAP (TUBING) ×3 IMPLANT
WATER STERILE IRR 1500ML POUR (IV SOLUTION) ×6 IMPLANT

## 2013-09-05 NOTE — Transfer of Care (Signed)
Immediate Anesthesia Transfer of Care Note  Patient: Kevin English  Procedure(s) Performed: Procedure(s): ROBOTIC ASSISTED LAPAROSCOPIC RIGHT RADICAL NEPHRECTOMY (Right)  Patient Location: PACU  Anesthesia Type:General  Level of Consciousness: sedated  Airway & Oxygen Therapy: Patient Spontanous Breathing and Patient connected to face mask oxygen  Post-op Assessment: Report given to PACU RN and Post -op Vital signs reviewed and stable  Post vital signs: Reviewed and stable  Complications: No apparent anesthesia complications

## 2013-09-05 NOTE — Op Note (Signed)
Urology Operative Report  Date of Procedure: 09/05/13  Surgeon: Rolan Bucco, MD Assistant: Phebe Colla, MD  Preoperative Diagnosis: Right renal mass. Postoperative Diagnosis:  Same  Procedure(s): Robotic right radical nephrectomy.  Estimated blood loss: 50 cc  Specimen: Right kidney.  Drains: Foley  Complications: None  Findings: No abnormal intra-abdominal findings. Multiple branches of right renal vein and artery. Lower pole right renal vein & artery.  History of present illness: 58 year old male with valvular heart disease and congestive heart failure along with a for for dilation was being worked up for valvular heart surgery when a right renal mass was discovered on arteriogram. This was larger than 3 cm. Staging was negative for metastatic disease. Despite the high risk for surgery, it was felt by his cardiothoracic surgeon in his cardiologist that it would be preferable to treat his presumed renal cancer prior to valvular heart disease as he would not be able to come off of anticoagulation afterwards. He presents today for right robotic radical nephrectomy.   Procedure in detail: After informed consent was obtained, the patient was taken to the operating room. They were placed in the supine position. SCDs were turned on and in place. IV antibiotics were infused, and general anesthesia was induced. A Foley catheter was placed by nursing staff using sterile technique. He had an arterial line as well as defibrillator pads placed in preparation for any possible complications. He received subcutaneous heparin 2 hours before surgery.  He was placed in a supine position on a beanbag. He was positioned in a lateral decubitus position with the right side up. Arms and legs were padded and positioned properly to avoid pressure on her fascia pressure points. The arm ports were used. Legs were physiologically and secured. The beanbag was desufflated after the table was flexed. Hair was  removed from the abdomen and chest. His right flank and abdomen were then prepped and draped in the usual sterile fashion. A timeout was performed in which the correct patient, surgical site, and procedure were identified and agreed upon by the team.   Attempt was made it varies access to the abdomen by grasping the skin with towel clips and pulling away from the body. There is needle was placed in the right upper quadrant without satisfactory insufflation so attempt was made at McBurney's point. This was also not satisfactory do to lack of proper insufflation. I then performed a cutdown Hassan fashion where an incision was made superior and lateral to the umbilicus dissection was carried down to the fascia and this was divided with Bovie electrocautery. The muscular fibers were then spread with hemostat and the posterior sheath was incised with scalpel. Peritoneal fat was then visualized. I was able to place the finger through this and sweep around inside the abdomen. There were no adhesions. I then placed a 12 mm port with ease and insufflation was attached to this. Opening pressures were low and his abdomen was insufflated properly. Camera was placed and intra-abdominal organs were then applied and inspected. No intra-abdominal injury was noted.  Robotic left upper portion and in place in the usual fashion with 38 mm ports positioned in the right lower quadrant just lateral to the rectus sheath, the right lower quadrant superior to the anterior superior iliac spine, in the right upper quadrant. 5 mm port was placed in the midline just below the xiphoid process with a 5 mm port; a grasper was then used to retract the liver by grasping the sidewall of the abdomen. Two  12 mm ports were placed in the midline superior to the mellitus and inferior to the umbilicus for assistant ports. The robot was then docked.  Attention was turned to the right lateral, wall. The white line of Toldt was incised and the colon  was reflected medially. The duodenum was identified and the Kocher maneuver was performed to reflect the duodenum medially. Attention was turned inferior to the kidney where dissection through the retro-peritoneal fat allowed [place] to identify the psoas muscle and in the ureter and gonadal vein. These were dissected up to the inferior vena cava and dissection was then carried up further until the main renal vein was identified. Dissection was carried posterior to the kidney between the psoas fascia in the kidney. We encountered a lower pole artery and vein which were seen on CT scan. These were divided together with a vascular load stapler was good hemostasis. With further dissection of the hilum I was able to identify a lower pole branch and the main artery. These were divided and ligated individually with vascular staple loads. The main renal vein was then divided and ligated with a vascular staple load. There was another small vein which was a branch of the main vein which was then ligated with hemoclips and divided. Finally there was a small upper pole branch to the artery which was divided and ligated with Hem'o'lock clips. This completed dissection of the vascular portion of the surgery.   Attention was turned the ureter. This was ligated superiorly and inferiorly with Hem-o-lok clips and divided. The kidney was then separated from Gerota's fascia with dissection close to the superior pole of the kidney to avoid removal of the right adrenal gland. After this was done the kidney was placed in a specimen bag. The bed of the renal fossa was then irrigated with sterile fluid and was found to be good hemostasis. This was then suctioned out.  The kidney was then removed by enlarging the lower midline 12 mm port to include the umbilicus. I was able to close the 12 mm ports with figure-of-eight 0 Vicryl sutures on a UR 6. After the kidney was removed the midline incision was closed with running #1 PDS sutures. All  wounds were then irrigated with sterile fluid. Subcutaneous tissues of 2-0 Vicryl were placed to prevent any potential space. All wounds were then injected with quarter percent Marcaine with epinephrine. A total of 30 cc was used. There was good hemostasis of all upper scrub port sites. I then closed the skin with staples and these were dressed with sterile dressing.  The patient remained hematemesis stable throughout the procedure. This completed the procedure. He was placed in a supine position, anesthesia was reversed, and he was taken to the PACU and then on to the step down unit as a precaution.  All counts were correct at the end of the case.

## 2013-09-05 NOTE — Anesthesia Postprocedure Evaluation (Addendum)
  Anesthesia Post-op Note  Patient: Kevin English  Procedure(s) Performed: Procedure(s) (LRB): ROBOTIC ASSISTED LAPAROSCOPIC RIGHT RADICAL NEPHRECTOMY (Right)  Patient Location: PACU  Anesthesia Type: General  Level of Consciousness: awake and alert   Airway and Oxygen Therapy: Patient Spontanous Breathing  Post-op Pain: mild  Post-op Assessment: Post-op Vital signs reviewed, Patient's Cardiovascular Status Stable, Respiratory Function Stable, Patent Airway and No signs of Nausea or vomiting  Last Vitals:  Filed Vitals:   09/05/13 1720  BP:   Pulse:   Temp: 36.3 C  Resp:     Post-op Vital Signs: stable   Complications: No apparent anesthesia complications. To Stepdown unit per plan. Only complaint is the foley catheter. Heart rate has been stable, normal sinus rhythm.

## 2013-09-05 NOTE — Progress Notes (Signed)
Post-op check   Status post robotic right radical nephrectomy for renal mass. Complicated past medical history including mitral valve disease and atrial fibrillation.  Surgery successful without complication.  A stone in the ICU for close observation.  We discussed the surgical findings in the course of surgery.   Filed Vitals:   09/05/13 1704  BP: 170/76  Pulse: 65  Temp: 97.4 F (36.3 C)  Resp: 11    GEN: NAD, AAO HEENT: Right pupil dilated; left pupil constricted (as it was before surgery) CV: RRR, S1/S2 Chest: CTA-B, no wheezes Abd: appropriately TTP, incision dressed. GU: No edema; foley draining yellow urine.   Hgb 16.8  A/P: Right renal neoplasm Status post robotic right radical nephrectomy.   Prophylactic heparin.  Hold treatment dose heparin.  Ambulate early.  Morning labs.  Judicious fluids.

## 2013-09-05 NOTE — H&P (Signed)
Urology History and Physical Exam  CC: Right renal mass.  HPI:  58 year old male with multiple medical comorbidities including congestive heart failure, atrial fibrillation, renal insufficiency,and mitral valve disease presents today for a right renal mass.  This was discovered during an arteriogram evaluating his heart disease.  This revealed a right renal mass.  This appeared to be enhancing and there is no precontrast phase.  This was approximately 3.2 cm in size.  It is mostly endophytic.  It is concerning for renal cell carcinoma.  We have discussed multiple management optionshe presents today for robotic-assisted radical laparoscopic right nephrectomy.  We have discussed the risks, benefits, alternatives, and likelihood of achieving goals.  He has been cleared by his cardiologist, Dr. [name], to hold his Coumadin and has been on a Lovenox bridge.  His last dose of that was last night.  He'll receive heparin preoperatively.  Imaging with arteriogram reveals 1 right renal artery and one right renal vein.  PMH: Past Medical History  Diagnosis Date  . Hernia   . Chronic rhinitis   . Diverticulitis of colon 15 years ago  . Hyperlipidemia   . Hypogonadism male   . Leukocytosis   . Myalgia and myositis, unspecified   . Obesity   . OSA on CPAP   . Hypothyroidism   . PAF (paroxysmal atrial fibrillation)     a. sustained 160-180 on e-CARDIO monitor placed 07/29/2013. Placed on IV amiodarone at end of May 2015 due to continued paroxysms with RVR.  Marland Kitchen Rheumatic heart disease   . Chronic diastolic CHF (congestive heart failure)     a. Occurs in context of AF-RVR with mitral stenosis.  . Mitral stenosis     a. Dx 07/2013 - through workup of 2D echo, TEE, and cath, felt to be moderate.  . Paroxysmal atrial flutter   . CKD (chronic kidney disease), stage II   . CAD (coronary artery disease)     a. By cath 08/2013: 70% distal cx-prox LPDA; tandem mod LAD lesions, o/w mild dz.  . Renal mass     a. Dx  08/2013: concerning for renal cancer.  Marland Kitchen HTN (hypertension)     hx of- under control  . Stroke 1998    "MRI showed 3 mild strokes"  . Borderline diabetes   . Depression   . Anxiety   . Dizzy spells   . Deafness in left ear     PSH: Past Surgical History  Procedure Laterality Date  . Tee without cardioversion N/A 08/16/2013    Procedure: TRANSESOPHAGEAL ECHOCARDIOGRAM (TEE);  Surgeon: Dorothy Spark, MD;  Location: [facility];  Service: Cardiovascular;  Laterality: N/A;  . Inguinal hernia repair Bilateral first one in 80's    "I had one side done twice"  . Cardiac catheterization  08/28/13  . Eye surgery Right 1990    "reconstructive, lens implant- from paintball accident"    Allergies: Allergies  Allergen Reactions  . Pravastatin Other (See Comments)    Myalgias     Medications: No prescriptions prior to admission     Social History: History   Social History  . Marital Status: Married    Spouse Name: N/A    Number of Children: N/A  . Years of Education: N/A   Occupational History  . Not on file.   Social History Main Topics  . Smoking status: Current Every Day Smoker -- 0.50 packs/day for 40 years    Types: Cigarettes  . Smokeless tobacco: Never Used  . Alcohol  Use: No  . Drug Use: No  . Sexual Activity: Not Currently   Other Topics Concern  . Not on file   Social History Narrative  . No narrative on file    Family History: Family History  Problem Relation Age of Onset  . Hypertension    . Hypertension Mother     Review of Systems: Positive: None. Negative: Gross hematuria, chest pain, SOB, or fever.  A further 10 point review of systems was negative except what is listed in the HPI.  Physical Exam: Filed Vitals:   09/05/13 0940  BP: 153/83  Pulse: 62  Temp: 97.7 F (36.5 C)  Resp: 18    General: No acute distress.  Awake. Head:  Normocephalic.  Atraumatic. ENT:  EOMI.  Mucous membranes moist Neck:  Supple.  No  lymphadenopathy. CV:  S1 present. S2 present. (h/o A-fib). Regular rate. Pulmonary: Equal effort bilaterally.  Clear to auscultation bilaterally. Abdomen: Soft.  Non- tender to palpation. Skin:  Normal turgor.  No visible rash. Extremity: No gross deformity of bilateral upper extremities.  No gross deformity of    bilateral lower extremities. Neurologic: Alert. Appropriate mood.  .  Studies:  No results found for this basename: HGB, WBC, PLT,  in the last 72 hours  No results found for this basename: NA, K, CL, CO2, BUN, CREATININE, CALCIUM, MAGNESIUM, GFRNONAA, GFRAA,  in the last 72 hours   No results found for this basename: PT, INR, APTT,  in the last 72 hours   No components found with this basename: ABG,     Assessment:  Right renal mass.  Plan: To OR for robotic-assisted radical laparoscopic right nephrectomy.

## 2013-09-06 ENCOUNTER — Encounter (HOSPITAL_COMMUNITY): Payer: Self-pay | Admitting: Urology

## 2013-09-06 DIAGNOSIS — I099 Rheumatic heart disease, unspecified: Secondary | ICD-10-CM

## 2013-09-06 DIAGNOSIS — I5033 Acute on chronic diastolic (congestive) heart failure: Secondary | ICD-10-CM

## 2013-09-06 DIAGNOSIS — I509 Heart failure, unspecified: Secondary | ICD-10-CM

## 2013-09-06 DIAGNOSIS — I4891 Unspecified atrial fibrillation: Secondary | ICD-10-CM

## 2013-09-06 DIAGNOSIS — I2510 Atherosclerotic heart disease of native coronary artery without angina pectoris: Secondary | ICD-10-CM

## 2013-09-06 DIAGNOSIS — I050 Rheumatic mitral stenosis: Secondary | ICD-10-CM

## 2013-09-06 DIAGNOSIS — Z7901 Long term (current) use of anticoagulants: Secondary | ICD-10-CM

## 2013-09-06 DIAGNOSIS — G4730 Sleep apnea, unspecified: Secondary | ICD-10-CM

## 2013-09-06 LAB — BASIC METABOLIC PANEL
BUN: 21 mg/dL (ref 6–23)
CALCIUM: 9.3 mg/dL (ref 8.4–10.5)
CO2: 19 mEq/L (ref 19–32)
Chloride: 100 mEq/L (ref 96–112)
Creatinine, Ser: 1.99 mg/dL — ABNORMAL HIGH (ref 0.50–1.35)
GFR calc Af Amer: 40 mL/min — ABNORMAL LOW (ref >90)
GFR, EST NON AFRICAN AMERICAN: 35 mL/min — AB (ref >90)
GLUCOSE: 188 mg/dL — AB (ref 70–99)
Potassium: 4.5 mEq/L (ref 3.7–5.3)
Sodium: 135 mEq/L — ABNORMAL LOW (ref 137–147)

## 2013-09-06 LAB — HEMOGLOBIN AND HEMATOCRIT, BLOOD
HCT: 46.2 % (ref 39.0–52.0)
Hemoglobin: 15.3 g/dL (ref 13.0–17.0)

## 2013-09-06 MED ORDER — BISACODYL 10 MG RE SUPP
10.0000 mg | Freq: Two times a day (BID) | RECTAL | Status: DC
  Administered 2013-09-06 – 2013-09-08 (×3): 10 mg via RECTAL
  Filled 2013-09-06 (×3): qty 1

## 2013-09-06 NOTE — Consult Note (Signed)
Cardiology Consultation Note  Patient ID: Kevin English, MRN: [id number], DOB/AGE: 29-Nov-1955 58 y.o. Admit date: 09/05/2013   Date of Consult: 09/06/2013 Primary Physician: Kevin Rile, Kevin English Primary Cardiologist: [name]  Chief Complaint: here for kidney removal - recent pre-op CT with possible renal cell carcinoma Reason for Consult: follow along perioperatively for mitral stenosis/PAF/diastolic CHF - awaiting cardiac surgery once renal issues resolved  HPI: Kevin English is a 58 y.o. male with a history of HTN, hyperlipidemia, hypothyroidism, tobacco abuse, recently diagnosed mitral stenosis/rheumatic heart disease awaiting cardiac surgery, PAF/paroxysmal atrial flutter, and diastolic CHF (typically occurring in setting of AF), probable CKD stage II who presented to [facility] for planned radial nephrectomy yesterday for possible renal carcinoma. In his workup for mitral stenosis, it was felt he should undergo this procedure before proceeding with cardiac surgery.  He has undergone evaluation recently by Dr. [name] for fatigue and SOB. Echo 07/20/13: mitral stenosis, mean gradient of 7 mmHg, peak gradient 60 mmHg and valve area by pressure half-time 1.26 cm. This was a big change when compared to the echocardiogram in 2011 when the gradients were normal. Since that time, the patient has developed symptomatic episodes of paroxysmal atrial fibrillation with RVR and a-flutter. He was originally on Xarelto then changed to Coumadin once his atrial fib was felt to be valvular in nature. He was admitted briefly 6/29-4/76 for a/c diastolic CHF. He underwent outpatient TEE on 08/16/13 showing moderate mitral stenosis ("Hockey stick appearance of the mitral valve, predominantly of the anterior leaflet that is thickened and mildly calcified. Restricted opening. Peak trans mitral gradient 15 mmHg, mean gradient 7 mmHg. Mobility is restricted predominantly of the anterior leaflet. Diastolic leaflet doming was  present. Moderate mitral stenosis, trace mitral regurgitation." There was also mod dilated LA, no thrombus, mild AI, trace TR, minimal AS. He was briefly readmitted on 5/29-5/31 with symptomatic recurrence of AF and placed on amiodarone. He saw Kevin English in outpatient consultation for further consideration of surgery. Anatomical findings appeared relatively favorable for possible balloon mitral valvuloplasty. However, the patient's problems with recurrent paroxysmal atrial fibrillation seemed to be driving much of his recent symptom progression, and there remained a significant likelihood that atrial fibrillation may persist despite successful balloon valvuloplasty. Ultimately it was felt that MVR/replacement with Maze procedure would offer the potential to deal with both problems more definitively. Kevin English recommended English&L cardiac cath and the patient was bridged with Coumadin->Lovenox. He was brought in for cath 08/28/13 showing: - Known moderate stenosis. Gradient estimated of 5.8 mmHg by catheter  - Conflicting cardiac outputs between Fick and thermodilution. Likely related to mitral valve disease  - Moderate CAD with most significant being at his 70% distal circumflex-proximal LPDA lesion. Also tandem moderate lesions in the LAD of unclear significance, with a small downstream vessel. Otherwise mild disease 30-40% in the RCA  As part of pre-operative evaluation, the patient had undergone CT angio abd/pelvis showing 3.3cm mass in the lower pole of the right kidney worrisome for renal cell carcinoma. Dr. Jasmine December with urology felt felt this would be difficult to remove with partial nephrectomy given the depth of the tumor and the location (also not suitable for biopsy), thus recommended radical nephrectomy, understanding this may likely result in CKD stage III. It was decided that due to need for anticoagulation post-MVR, the patient should undergo nephrectomy first before proceeding with cardiac surgery. The  patient was started on Lovenox post-cath and this was continued until the night before his surgery. Dr.  [name] previously recommended resuming Coumadin as soon as acceptable from surgical standpoint. We had previously stopped his Lasix post-cath due to a mild bump in creatinine and desire to preserve his renal function before surgery. As an aside, he usually only goes into CHF when in atrial fibrillation. He remains on amiodarone.  Post-operatively, he is doing well remaining in sinus rhythm without complaints of chest pain or shortness of breath..   Past Medical History  Diagnosis Date  . Hernia   . Chronic rhinitis   . Diverticulitis of colon 15 years ago  . Hyperlipidemia   . Hypogonadism male   . Leukocytosis   . Myalgia and myositis, unspecified   . Obesity   . OSA on CPAP   . Hypothyroidism   . PAF (paroxysmal atrial fibrillation)     a. sustained 160-180 on e-CARDIO monitor placed 07/29/2013. Placed on IV amiodarone at end of May 2015 due to continued paroxysms with RVR.  Marland Kitchen Rheumatic heart disease   . Chronic diastolic CHF (congestive heart failure)     a. Occurs in context of AF-RVR with mitral stenosis.  . Mitral stenosis     a. Dx 07/2013 - through workup of 2D echo, TEE, and cath, felt to be moderate.  . Paroxysmal atrial flutter   . CKD (chronic kidney disease), stage II   . CAD (coronary artery disease)     a. By cath 08/2013: 70% distal cx-prox LPDA; tandem mod LAD lesions, o/w mild dz.  . Renal mass     a. Dx 08/2013: concerning for renal cancer.  Marland Kitchen HTN (hypertension)     hx of- under control  . Stroke 1998    "MRI showed 3 mild strokes"  . Borderline diabetes   . Depression   . Anxiety   . Dizzy spells   . Deafness in left ear       Most Recent Cardiac Studies: Echo and TEE as above  Cath 08/28/13  CARDIAC CATHETERIZATION REPORT  NAME: Kevin English MRN: [id number]  DOB: [birth year] ADMIT DATE: 08/28/2013  Procedure Date: 08/28/2013  INTERVENTIONAL CARDIOLOGIST:  Kevin English, M.D., MS  PRIMARY CARE Kevin English: Kevin Rile, Kevin English  PRIMARY CARDIOLOGIST: Kevin Carnes, Kevin English & Kevin Dawley, Kevin English  CT Surgeon: Kevin English  PATIENT: LIVAN HIRES is a 58 y.o. male known rheumatic heart disease with mitral stenosis referred for right heart catheterization as part of evaluation for preop mitral valve replacement. His recent trouble with atrial fibrillation with heart failure therefore is being sent for valve replacement as opposed to balloon valvuloplasty.  PRE-OPERATIVE DIAGNOSIS:  Moderate-severe marked stenosis  Worsening heart failure symptoms  Atrial fibrillation, recurrent PROCEDURES PERFORMED:  Right & Left Heart Catheterization with Native Coronary Angiography via Right Radial Artery & Right Common Femoral Vein Access. PROCEDURE: The patient was brought to the [facility] in the fasting state and prepped and draped in the usual sterile fashion for right radial and/or femoral arterial or radial access. A modified Kevin English test was performed on the right wrist demonstrating excellent collateral flow. Sterile technique was used including antiseptics, cap, gloves, gown, hand hygiene, mask and sheet. Skin prep: Chlorhexidine.  Consent: Risks of procedure as well as the alternatives and risks of each were explained to the (patient/caregiver). Consent for procedure obtained.  Time Out: Verified patient identification, verified procedure, site/side was marked, verified correct patient position, special equipment/implants available, medications/allergies/relevent history reviewed, required imaging and test results available. Performed.  Access:  Initial attempts were  made to exchange the existing 18-gauge right antecubital IV over a wire for a 6 Fr glide sheath were unsuccessful, the decision was made to abort the brachial approach and obtained femoral venous access.  Right Common Femoral Vein: 7 Fr Sheath - Seldinger Technique.  Right  Radial Artery: 6 Fr Sheath - modified Seldinger Technique  4500 Units IV Heparin; 10 mL Radial Cocktail  Right Heart Catheterization: 7Fr Swan Ganz catheter advanced under fluoroscopy with balloon inflated to the RA, RV, then PCWP-PA for hemodynamic measurement.  Simultaneous FA & PA blood gases checked for SaO2% to calculate FICK CO/CI  Thermodilution Injections performed to calculate CO/CI  Simultaneous PCWP/LV & RV/LV pressures monitored with Angled Pigtail in LV.  Catheter removed completely out of the body with balloon deflated.  Left Heart Catheterization: 5 Fr Catheters advanced or exchanged over a J-wire; TIG 4.0 catheter advanced first.  LV Hemodynamics: TIG 4.0  Left Coronary Artery Cineangiography: JL43.5Catheter  Right Coronary Artery: JR 4 Catheter  English Femoral Venous Sheath removed in the holding area with manual pressure for hemostasis.  TR Band: 15 mL @ 4163  FINDINGS:  Hemodynamics:  Findings:   SaO2%  Pressures mmHg  Mean P  mmHg  EDP  mmHg   Right Atrium   11/9   8   Right Ventricle   31/1   5   Pulmonary Artery  72  39.6  19    PCWP   17/13  13    Central Aortic  91  131/77  99    Left Ventricle   130/0   6          Cardiac Output:   Cardiac Index:    Fick  6.65   3.26    Thermodilution  3.97   1 .95    Left Ventriculography: Deferred  Coronary Anatomy: Left Dominant  Left Main: Large-caliber, dominant vessel that bifurcates into the LAD and Circumflex. Angiographically normal. Short vessel LAD: Large caliber vessel that gives off a very large proximal septal perforator trunk and a major diagonal branch. At this branch point is a roughly 40-50% stenosis. The vessel then continues distally to gives off a second diagonal branch. After the diagonal branch the vessel then tapers to a very small-caliber vessel the apex. He barely reaches the apex. After D2 there appears to be a focal lesion although this was not very well visualized. Probably 50-60%.  D1: Moderate  caliber proximal vessel it covers the anterolateral wall. Minimal luminal irregularities with several branches.  D2: Moderate caliber vessel, mid LAD, minimal luminal irregularities. At this point the vessel is actually larger than the follow on LAD. Left Circumflex: Very large caliber, dominant vessel. It gives rise to a very small marginal branch that runs like a ramus intermedius with diffuse disease including a proximal 70-80% stenosis. This is not amenable to PCI or CABG. There is an another large lateral OM before the vessel courses into the AV groove giving rise to 2 small posterior lateral branches and the posterior descending artery. Just after the posterior lateral branches going into the PDA there is a focal irregular 70% stenosis.  OM1: Large-caliber, lateral branch mild luminal irregularities. This vessel courses down to the inferolateral apex.  LPDA: Moderate large-caliber vessel with a proximal disease noted above. Beyond that has mild diffuse luminal irregularities of 10-20%. It reaches almost to the apex.  RCA: Small caliber, nondominant vessel. There is a separate ostium for the SA nodal artery to the main RCA has  a early mid 30-40% stenosis that branches into 2 smaller marginal branches.  MEDICATIONS:  Anesthesia: Local Lidocaine 2 mL for radial, 12 ml for femoral vein  Sedation: 3 mg IV Versed, 75 mcg IV fentanyl ;  Omnipaque Contrast: 80 ml  Anticoagulation: IV Heparin 4500 Units ;  Radial Cocktail: 5 mg Verapamil, 400 mcg NTG, 2 ml 2% Lidocaine in 10 ml NS PATIENT DISPOSITION:  The patient was transferred to the PACU holding area in a hemodynamicaly stable, chest pain free condition.  The patient tolerated the procedure well, and there were no complications. EBL: < 10 ml  The patient was stable before, during, and after the procedure. POST-OPERATIVE DIAGNOSIS:  Known moderate stenosis. Gradient estimated of 5.8 mmHg by catheter  Conflicting cardiac outputs between Fick and  thermodilution. Likely related to mitral valve disease  Moderate CAD with most significant being at his 70% distal circumflex-proximal LPDA Asian. Also tandem moderate lesions in the LAD of unclear significance, with a small downstream vessel.Marland Kitchen PLAN OF CARE:  The patient will be admitted for planned inpatient valve surgery  I will review the films with Kevin English to determine whether or not he feels that CABG is warranted.  Continue home medications. Kevin English, M.D., M.S.  [facility]  [address] [place], [place] [address]  [phone]  08/28/2013  3:00 PM    Surgical History:  Past Surgical History  Procedure Laterality Date  . Tee without cardioversion N/A 08/16/2013    Procedure: TRANSESOPHAGEAL ECHOCARDIOGRAM (TEE);  Surgeon: Dorothy Spark, Kevin English;  Location: [facility];  Service: Cardiovascular;  Laterality: N/A;  . Inguinal hernia repair Bilateral first one in 80's    "I had one side done twice"  . Cardiac catheterization  08/28/13  . Eye surgery Right 1990    "reconstructive, lens implant- from paintball accident"  . Robot assisted laparoscopic nephrectomy Right 09/05/2013    Procedure: ROBOTIC ASSISTED LAPAROSCOPIC RIGHT RADICAL NEPHRECTOMY;  Surgeon: Kevin Creamer, Kevin English;  Location: [facility];  Service: Urology;  Laterality: Right;     Home Meds: Prior to Admission medications   Medication Sig Start Date End Date Taking? Authorizing Kevin English  amiodarone (PACERONE) 400 MG tablet Take 400 mg by mouth every morning.   Yes Historical Kevin Grime, Kevin English  diltiazem (CARDIZEM CD) 120 MG 24 hr capsule Take 120 mg by mouth every morning.   Yes Historical Kevin Venne, Kevin English  enoxaparin (LOVENOX) 100 MG/ML injection Inject 0.9 mLs (90 mg total) into the skin every 12 (twelve) hours. 08/29/13  Yes Kevin N Dunn, English  levothyroxine (SYNTHROID, LEVOTHROID) 50 MCG tablet Take 50 mcg by mouth daily before breakfast.  12/02/12  Yes Historical Katoria Yetman, Kevin English     Inpatient Medications:  . amiodarone  400 mg Oral q morning - 10a  . bisacodyl  10 mg Rectal BID  . diltiazem  120 mg Oral q morning - 10a  . heparin subcutaneous  5,000 Units Subcutaneous Q8H  . levothyroxine  50 mcg Oral QAC breakfast  . senna-docusate  1 tablet Oral BID   . sodium chloride 50 mL/hr at 09/05/13 1800    Allergies:  Allergies  Allergen Reactions  . Pravastatin Other (See Comments)    Myalgias     History   Social History  . Marital Status: Married    Spouse Name: N/A    Number of Children: N/A  . Years of Education: N/A   Occupational History  . Not on file.   Social  History Main Topics  . Smoking status: Current Every Day Smoker -- 0.50 packs/day for 40 years    Types: Cigarettes  . Smokeless tobacco: Never Used  . Alcohol Use: No  . Drug Use: No  . Sexual Activity: Not Currently   Other Topics Concern  . Not on file   Social History Narrative  . No narrative on file     Family History  Problem Relation Age of Onset  . Hypertension    . Hypertension Mother      Review of Systems: All other systems reviewed and are otherwise negative except as noted above.  Labs:  Lab Results  Component Value Date   WBC 27.9* 09/05/2013   HGB 15.3 09/06/2013   HCT 46.2 09/06/2013   MCV 94.4 09/05/2013   PLT 248 09/05/2013    Recent Labs Lab 09/06/13 0215  NA 135*  K 4.5  CL 100  CO2 19  BUN 21  CREATININE 1.99*  CALCIUM 9.3  GLUCOSE 188*   Radiology/Studies:  Dg Chest Port 1 View  08/18/2013   CLINICAL DATA:  Chest pain.  Hypertension.  EXAM: PORTABLE CHEST - 1 VIEW  COMPARISON:  07/2013  FINDINGS: The heart size and mediastinal contours are within normal limits. Both lungs are clear. The visualized skeletal structures are unremarkable.  IMPRESSION: No active disease.   Electronically Signed   By: Earle Gell M.D.   On: 08/18/2013 14:27   Dg Chest Port 1 View  08/09/2013   CLINICAL DATA:  TACHYCARDIA  EXAM: PORTABLE CHEST - 1 VIEW   COMPARISON:  DG CHEST 2V dated 05/20/2009  FINDINGS: Mild enlargement of the cardiac silhouette. Engorgement of the central pulmonary vascularity. Increased interstitial markings consistent with interstitial edema. No pleural effusion or alveolar infiltrate. No acute bony thoracic abnormality.  IMPRESSION: Congestive heart failure with mild pulmonary interstitial edema.   Electronically Signed   By: David  Martinique   On: 08/09/2013 13:32   Ct Angio Chest Aorta W/cm &/or Wo/cm  08/25/2013   CLINICAL DATA:  Preop, mitral valve replacement. Rule out aortic dissection.  EXAM: CT ANGIOGRAPHY CHEST, ABDOMEN AND PELVIS  TECHNIQUE: Multidetector CT imaging through the chest, abdomen and pelvis was performed using the standard protocol during bolus administration of intravenous contrast. Multiplanar reconstructed images and MIPs were obtained and reviewed to evaluate the vascular anatomy.  CONTRAST:  27mL OMNIPAQUE IOHEXOL 350 MG/ML SOLN  COMPARISON:  None.  FINDINGS: CTA CHEST FINDINGS  No evidence of intramural hematoma on the precontrast images. Mild atherosclerotic calcifications are present. Minimal calcification in the LAD and circumflex coronary arteries.  No evidence of aortic aneurysm or aortic dissection. Innominate artery, right subclavian artery, right common carotid artery, right vertebral artery, left common carotid artery left subclavian artery, and left vertebral artery are all widely patent within the confines of the exam.  Left atrium is markedly enlarged consistent with given history of mitral valve malfunction. No evidence of abnormal mediastinal adenopathy.  Clear lungs.  No acute bony deformity.  Review of the MIP images confirms the above findings.  CTA ABDOMEN AND PELVIS FINDINGS  Aorta is non aneurysmal and patent without evidence of dissection. Mild atherosclerotic plaque in the infrarenal aorta.  Separate takeoff for the splenic artery is widely patent. This leads to the left gastric branch.  Common  trunk for the common hepatic artery and SMA. This is a completely replaced common hepatic artery from the SMA, a normal variation. Branch vessels are patent.  IMA is diminutive and  patent.  Branch vessels are patent.  Single renal arteries are patent.  Diffuse atherosclerotic plaque in the right common iliac artery resulting in 50% caliber narrowing. Mild narrowing in the proximal right external iliac artery. It is there after patent. Right internal iliac artery is patent.  Mild plaque in the left common iliac artery. Mild narrowing in the proximal left external iliac artery. It is there after patent. Left internal iliac artery is patent.  3.3 cm heterogeneously enhancing mass in the lower pole of the right kidney worrisome for renal cell carcinoma. Renal cysts are present. Tiny angiomyolipoma in the upper pole of the left kidney.  Liver, gallbladder, spleen, pancreas, adrenal glands are within normal limits.  No evidence of abnormal retroperitoneal adenopathy by measurement criteria. Tiny pericaval nodes adjacent of the right kidney are present.  No free-fluid.  Normal appendix. Sigmoid diverticulosis without acute diverticulitis  Very large bilateral inguinal hernias containing adipose tissue.  Bladder and prostate are within normal limits.  No acute bony deformity.  Review of the MIP images confirms the above findings.  IMPRESSION: No evidence of thoracic aortic dissection or intramural hematoma.  No acute thoracic pathology.  No evidence of dissection within the abdominal aorta. Infrarenal atherosclerotic plaque is present.  Diffuse disease in the right common iliac artery resulting in 50% narrowing of the caliber. Non significant disease of the remainder of the iliac vasculature.  3.3 cm mass in the lower pole of the right kidney worrisome for renal cell carcinoma.  Large bilateral inguinal hernias containing adipose tissue.   Electronically Signed   By: Maryclare Bean M.D.   On: 08/25/2013 14:49   Ct Angio  Abd/pel W/ And/or W/o  08/25/2013   CLINICAL DATA:  Preop, mitral valve replacement. Rule out aortic dissection.  EXAM: CT ANGIOGRAPHY CHEST, ABDOMEN AND PELVIS  TECHNIQUE: Multidetector CT imaging through the chest, abdomen and pelvis was performed using the standard protocol during bolus administration of intravenous contrast. Multiplanar reconstructed images and MIPs were obtained and reviewed to evaluate the vascular anatomy.  CONTRAST:  78mL OMNIPAQUE IOHEXOL 350 MG/ML SOLN  COMPARISON:  None.  FINDINGS: CTA CHEST FINDINGS  No evidence of intramural hematoma on the precontrast images. Mild atherosclerotic calcifications are present. Minimal calcification in the LAD and circumflex coronary arteries.  No evidence of aortic aneurysm or aortic dissection. Innominate artery, right subclavian artery, right common carotid artery, right vertebral artery, left common carotid artery left subclavian artery, and left vertebral artery are all widely patent within the confines of the exam.  Left atrium is markedly enlarged consistent with given history of mitral valve malfunction. No evidence of abnormal mediastinal adenopathy.  Clear lungs.  No acute bony deformity.  Review of the MIP images confirms the above findings.  CTA ABDOMEN AND PELVIS FINDINGS  Aorta is non aneurysmal and patent without evidence of dissection. Mild atherosclerotic plaque in the infrarenal aorta.  Separate takeoff for the splenic artery is widely patent. This leads to the left gastric branch.  Common trunk for the common hepatic artery and SMA. This is a completely replaced common hepatic artery from the SMA, a normal variation. Branch vessels are patent.  IMA is diminutive and patent.  Branch vessels are patent.  Single renal arteries are patent.  Diffuse atherosclerotic plaque in the right common iliac artery resulting in 50% caliber narrowing. Mild narrowing in the proximal right external iliac artery. It is there after patent. Right internal  iliac artery is patent.  Mild plaque in the left common  iliac artery. Mild narrowing in the proximal left external iliac artery. It is there after patent. Left internal iliac artery is patent.  3.3 cm heterogeneously enhancing mass in the lower pole of the right kidney worrisome for renal cell carcinoma. Renal cysts are present. Tiny angiomyolipoma in the upper pole of the left kidney.  Liver, gallbladder, spleen, pancreas, adrenal glands are within normal limits.  No evidence of abnormal retroperitoneal adenopathy by measurement criteria. Tiny pericaval nodes adjacent of the right kidney are present.  No free-fluid.  Normal appendix. Sigmoid diverticulosis without acute diverticulitis  Very large bilateral inguinal hernias containing adipose tissue.  Bladder and prostate are within normal limits.  No acute bony deformity.  Review of the MIP images confirms the above findings.  IMPRESSION: No evidence of thoracic aortic dissection or intramural hematoma.  No acute thoracic pathology.  No evidence of dissection within the abdominal aorta. Infrarenal atherosclerotic plaque is present.  Diffuse disease in the right common iliac artery resulting in 50% narrowing of the caliber. Non significant disease of the remainder of the iliac vasculature.  3.3 cm mass in the lower pole of the right kidney worrisome for renal cell carcinoma.  Large bilateral inguinal hernias containing adipose tissue.   Electronically Signed   By: Maryclare Bean M.D.   On: 08/25/2013 14:49   EKG: 6/8: sinus bradycardia, RBBB, left axis deviation  Physical Exam: Blood pressure 164/84, pulse 66, temperature 98.5 F (36.9 C), temperature source Oral, resp. rate 17, height 5\' 8"  (1.727 m), weight 202 lb (91.627 kg), SpO2 95.00%. General: Well developed, well nourished, in no acute distress. Head: Normocephalic, atraumatic, sclera non-icteric, no xanthomas, nares are without discharge.  Neck: Negative for carotid bruits. JVD not elevated. Lungs:  Clear bilaterally to auscultation without wheezes, rales, or rhonchi. Breathing is unlabored. Heart: RRR with S1 S2. No murmurs, rubs, or gallops appreciated. Abdomen: Soft, non-tender, non-distended with normoactive bowel sounds. No hepatomegaly. No rebound/guarding. No obvious abdominal masses. Msk:  Strength and tone appear normal for age. Extremities: No clubbing or cyanosis. No edema.  Distal pedal pulses are 2+ and equal bilaterally. Neuro: Alert and oriented X 3. No facial asymmetry. No focal deficit. Moves all extremities spontaneously. Psych:  Responds to questions appropriately with a normal affect.   Assessment and Plan:  1. Right renal mass suspicious for renal cancer s/p radial nephrectomy 2. Acute on CKD stage II 3. Rheumatic heart disease with moderate mitral stenosis, awaiting MV repair/replacement 4. Moderate CAD with 70% distal cx-prox LPDA; tandem mod LAD lesions, o/w mild dz, Kevin English did not feel was significant 5. Paroxysmal atrial fibrillation/atrial flutter, on amiodarone 6. Chronic diastolic CHF in setting of #1, #4 7. HTN  8. Leukocytosis, persistent - pt had been instructed to f/u PCP for evaluation as outpatient 9. Hyperglycemia - prior hx of pre-DM  See below for further thoughts.    Signed, Kevin English 09/06/2013, 3:11 PM   Agree with note by Kevin English  Patient status post laparoscopic nephrectomy for presumed renal cell carcinoma. He has a history of rheumatic heart disease with mitral stenosis, cardiac catheterization documented CAD and paroxysmal atrial fibrillation on amiodarone. He was on Coumadin anticoagulation and had Lovenox bridging. His procedure was uncomplicated. He remains in sinus rhythm. His only complaint is incisional. He will either be Lovenox bridge to Coumadin anticoagulation at discharge and followup with Dr. Lilian Kapur. The ultimate plan is consideration of mitral valve repair and/or replacement as well as surgical Maze  procedure  by Dr. Starleen Arms, M.D., [place], [facility], [place] [address]. [place], [place]  [address]  [phone] 09/06/2013 5:07 PM .

## 2013-09-06 NOTE — Progress Notes (Signed)
Urology Progress Note  Subjective:     No acute urologic events overnight. Cath out this morning; voiding without difficulty. No flatus or BM. Negative nausea. Positive abdominal pain patient states is tolerable.  ROS: Negative chest pain or SOB.  Objective:  Patient Vitals for the past 24 hrs:  BP Temp Temp src Pulse Resp SpO2 Height Weight  09/06/13 0600 - - - 58 12 98 % - -  09/06/13 0500 - - - 59 11 97 % - -  09/06/13 0400 - 98.3 F (36.8 C) Oral 61 16 96 % - -  09/06/13 0300 - - - 58 10 96 % - -  09/06/13 0200 - - - 62 13 98 % - -  09/06/13 0100 - - - 59 15 97 % - -  09/06/13 0000 - 99.3 F (37.4 C) Oral 59 11 95 % - -  09/05/13 2300 - - - 63 12 95 % - -  09/05/13 2200 - - - 64 15 93 % - -  09/05/13 2100 - - - 63 12 96 % - -  09/05/13 2000 - 98.3 F (36.8 C) Oral 61 11 96 % - -  09/05/13 1900 - - - 65 11 95 % - -  09/05/13 1800 - - - 69 12 95 % - -  09/05/13 1720 - 97.4 F (36.3 C) Oral - - - - -  09/05/13 1715 - - - 63 9 93 % - -  09/05/13 1704 170/76 mmHg 97.4 F (36.3 C) - 65 11 95 % - -  09/05/13 1645 177/82 mmHg 97.5 F (36.4 C) - 68 10 94 % - -  09/05/13 1630 167/85 mmHg - - 69 10 98 % - -  09/05/13 1617 182/95 mmHg - - - - - - -  09/05/13 1615 - - - 74 13 95 % - -  09/05/13 1601 - - - 76 13 94 % - -  09/05/13 1600 181/90 mmHg 98.5 F (36.9 C) - 78 15 93 % - -  09/05/13 1559 183/90 mmHg - - - 16 - - -  09/05/13 1307 - - - - - - 5\' 8"  (1.727 m) 91.627 kg (202 lb)  09/05/13 0940 153/83 mmHg 97.7 F (36.5 C) Oral 62 18 98 % - -    Physical Exam: General:  No acute distress, awake Cardiovascular:    [x]   S1/S2 present, RRR  []   Irregularly irregular Chest:  CTA-B Abdomen:               []  Soft, appropriately TTP  []  Soft, NTTP  [x]  Soft, appropriately TTP, incision(s) dressed w/ some soakage.  Genitourinary: No edema. No catheter.     I/O last 3 completed shifts: In: 2129.2 [I.V.:2129.2] Out: 300 [Urine:225; Blood:75]  Recent Labs   09/05/13  1605  09/06/13  0215  HGB  16.8  16.8  15.3  WBC  27.9*   --   PLT  248   --     Recent Labs     09/05/13  1605  09/06/13  0215  NA   --   135*  K   --   4.5  CL   --   100  CO2   --   19  BUN   --   21  CREATININE  1.73*  1.99*  CALCIUM   --   9.3  GFRNONAA  41*  35*  GFRAA  48*  40*     No  results found for this basename: PT, INR, APTT,  in the last 72 hours   No components found with this basename: ABG,     Length of stay: 1 days.  Assessment: Right renal mass. POD#1 Robotic right radical nephrectomy.   Plan: -D/c arterial line. -Dulcolax suppositry. -Ambulate. -Transfer to floor/telemetry.   Rolan Bucco, MD [phone]

## 2013-09-07 ENCOUNTER — Inpatient Hospital Stay (HOSPITAL_COMMUNITY): Payer: Managed Care, Other (non HMO)

## 2013-09-07 LAB — HEMOGLOBIN AND HEMATOCRIT, BLOOD
HCT: 43.9 % (ref 39.0–52.0)
Hemoglobin: 14.5 g/dL (ref 13.0–17.0)

## 2013-09-07 LAB — BASIC METABOLIC PANEL
BUN: 25 mg/dL — AB (ref 6–23)
CHLORIDE: 103 meq/L (ref 96–112)
CO2: 22 meq/L (ref 19–32)
Calcium: 10 mg/dL (ref 8.4–10.5)
Creatinine, Ser: 2.16 mg/dL — ABNORMAL HIGH (ref 0.50–1.35)
GFR calc non Af Amer: 31 mL/min — ABNORMAL LOW (ref >90)
GFR, EST AFRICAN AMERICAN: 36 mL/min — AB (ref >90)
Glucose, Bld: 130 mg/dL — ABNORMAL HIGH (ref 70–99)
POTASSIUM: 4.4 meq/L (ref 3.7–5.3)
Sodium: 138 mEq/L (ref 137–147)

## 2013-09-07 MED ORDER — BISACODYL 10 MG RE SUPP: 10.0000 mg | Freq: Two times a day (BID) | RECTAL | Status: DC

## 2013-09-07 MED ORDER — HYDRALAZINE HCL 10 MG PO TABS
10.0000 mg | ORAL_TABLET | Freq: Once | ORAL | Status: AC
  Administered 2013-09-07: 10 mg via ORAL
  Filled 2013-09-07: qty 1

## 2013-09-07 MED ORDER — SENNOSIDES-DOCUSATE SODIUM 8.6-50 MG PO TABS: 1.0000 | ORAL_TABLET | Freq: Two times a day (BID) | ORAL | Status: DC

## 2013-09-07 MED ORDER — ENOXAPARIN SODIUM 100 MG/ML ~~LOC~~ SOLN: 90.0000 mg | Freq: Two times a day (BID) | SUBCUTANEOUS | Status: DC

## 2013-09-07 MED ORDER — OXYCODONE HCL 5 MG PO TABS: 5.0000 mg | ORAL_TABLET | ORAL | Status: DC | PRN

## 2013-09-07 NOTE — Progress Notes (Signed)
Surgical dressings removed.  Wounds clean dry and intact.  He states he has not had any worsening shortness of breath.  I reviewed the results of the chest x-ray which showed no pulmonary edema.

## 2013-09-07 NOTE — Progress Notes (Signed)
Patient had already placed himself on CPAP when RT walked in room. RT advised patient to call if he needed any assistance with the CPAP. RT will continue to monitor.

## 2013-09-07 NOTE — Progress Notes (Signed)
Urology Progress Note  Subjective:     No acute urologic events overnight. Positive BM and flatus.  Ambulating without difficulty. Pain controlled w/ PO meds. Some SOB/DOE this morning w/ deep breathing. Oxygen sats remain high. States it is not bothersome.  ROS: Negative chest pain or fever.  Objective:  Patient Vitals for the past 24 hrs:  BP Temp Temp src Pulse Resp SpO2  09/07/13 0634 158/92 mmHg 98.5 F (36.9 C) Oral 78 20 98 %  09/07/13 0357 188/94 mmHg 98.4 F (36.9 C) Oral 77 18 97 %  09/06/13 2147 187/89 mmHg 97.9 F (36.6 C) Oral 63 18 96 %  09/06/13 1420 164/84 mmHg 98.5 F (36.9 C) Oral 66 17 95 %  09/06/13 0900 174/82 mmHg - - - - -  09/06/13 0800 155/81 mmHg 98.1 F (36.7 C) Oral 66 17 93 %    Physical Exam: General:  No acute distress, awake Cardiovascular:    [x]   S1/S2 present, RRR  []   Irregularly irregular Chest:  CTA-B, no crackles. Abdomen:               []  Soft, appropriately TTP  []  Soft, NTTP  [x]  Soft, appropriately TTP, incision(s) dressed w/ some soakage.  Genitourinary: No edema. No catheter.     I/O last 3 completed shifts: In: 2880 [P.O.:1080; I.V.:1700; IV Piggyback:100] Out: 3325 [HENID:7824]  Recent Labs     09/05/13  1605  09/06/13  0215  09/07/13  0445  HGB  16.8  16.8  15.3  14.5  WBC  27.9*   --    --   PLT  248   --    --     Recent Labs     09/06/13  0215  09/07/13  0445  NA  135*  138  K  4.5  4.4  CL  100  103  CO2  19  22  BUN  21  25*  CREATININE  1.99*  2.16*  CALCIUM  9.3  10.0  GFRNONAA  35*  31*  GFRAA  40*  36*     No results found for this basename: PT, INR, APTT,  in the last 72 hours   No components found with this basename: ABG,     Length of stay: 2 days.  Assessment: Right renal mass. POD#2 Robotic right radical nephrectomy.   Plan:  -Appreciate cardiology input.  -Saline lock IV.  -Chest xray for dyspnea.  -Will need to hold on full treatment dose anticoagulation for a  total of 7 days (OK to resume 09/12/13). Continue heparin prophylaxis for now.  -Monitor renal function.  -Likely d/c home tomorrow.   Rolan Bucco, MD [phone]

## 2013-09-07 NOTE — Progress Notes (Signed)
    Subjective:  Denies CP; complains of dyspnea that he feels may be related to to the pain in his abdomen from recent surgery.  Objective:  Filed Vitals:   09/06/13 1420 09/06/13 2147 09/07/13 0357 09/07/13 0634  BP: 164/84 187/89 188/94 158/92  Pulse: 66 63 77 78  Temp: 98.5 F (36.9 C) 97.9 F (36.6 C) 98.4 F (36.9 C) 98.5 F (36.9 C)  TempSrc: Oral Oral Oral Oral  Resp: 17 18 18 20   Height:      Weight:      SpO2: 95% 96% 97% 98%    Intake/Output from previous day:  Intake/Output Summary (Last 24 hours) at 09/07/13 1245 Last data filed at 09/07/13 0500  Gross per 24 hour  Intake   1800 ml  Output   2700 ml  Net   -900 ml    Physical Exam: Physical exam: Well-developed well-nourished in no acute distress.  Skin is warm and dry.  HEENT is normal.  Neck is supple.  Chest with mild expiratory wheeze Cardiovascular exam is regular rate and rhythm.  Abdominal exam status post abdominal surgery Extremities show no edema. neuro grossly intact    Lab Results: Basic Metabolic Panel:  Recent Labs  09/06/13 0215 09/07/13 0445  NA 135* 138  K 4.5 4.4  CL 100 103  CO2 19 22  GLUCOSE 188* 130*  BUN 21 25*  CREATININE 1.99* 2.16*  CALCIUM 9.3 10.0   CBC:  Recent Labs  09/05/13 1605 09/06/13 0215 09/07/13 0445  WBC 27.9*  --   --   HGB 16.8  16.8 15.3 14.5  HCT 50.0  49.3 46.2 43.9  MCV 94.4  --   --   PLT 248  --   --      Assessment/Plan:  1 status post nephrectomy-management per urology. 2 history of paroxysmal atrial fibrillation-patient remains in sinus rhythm. Continue amiodarone and Cardizem. Resume Coumadin when okay with surgery. 3 mitral stenosis-plans for mitral valve replacement and maze procedure once he recovers from nephrectomy. Note patient is describing some dyspnea this morning which he relates to pain in his abdomen that increases with inspiration. However there is a mild wheeze on examination and he does have a history of  congestive heart failure related to his mitral stenosis. Discontinue IV fluids and follow. Resume Lasix later pending followup renal function. 4 acute renal failure-creatinine has increased. Follow renal function closely. Would continue telemetry today. Hopefully he will be stable for discharge tomorrow if okay with urology. 5 hypertension-blood pressure is elevated. We will monitor and add additional medications as needed.  Kevin English 09/07/2013, 6:37 AM

## 2013-09-08 LAB — BASIC METABOLIC PANEL
BUN: 25 mg/dL — ABNORMAL HIGH (ref 6–23)
CHLORIDE: 102 meq/L (ref 96–112)
CO2: 22 meq/L (ref 19–32)
Calcium: 9.9 mg/dL (ref 8.4–10.5)
Creatinine, Ser: 2.28 mg/dL — ABNORMAL HIGH (ref 0.50–1.35)
GFR calc Af Amer: 34 mL/min — ABNORMAL LOW (ref >90)
GFR calc non Af Amer: 29 mL/min — ABNORMAL LOW (ref >90)
GLUCOSE: 110 mg/dL — AB (ref 70–99)
POTASSIUM: 4.6 meq/L (ref 3.7–5.3)
SODIUM: 137 meq/L (ref 137–147)

## 2013-09-08 LAB — HEMOGLOBIN AND HEMATOCRIT, BLOOD
HCT: 43.1 % (ref 39.0–52.0)
Hemoglobin: 14.1 g/dL (ref 13.0–17.0)

## 2013-09-08 MED ORDER — HYDRALAZINE HCL 25 MG PO TABS
25.0000 mg | ORAL_TABLET | Freq: Three times a day (TID) | ORAL | Status: DC
  Filled 2013-09-08 (×4): qty 1

## 2013-09-08 NOTE — Discharge Summary (Signed)
Physician Discharge Summary  Patient ID: Kevin English MRN: [id number] DOB/AGE: April 28, 1955 58 y.o.  Admit date: 09/05/2013 Discharge date: 09/08/2013  Admission Diagnoses:  Discharge Diagnoses:  Active Problems:   Renal neoplasm   Discharged Condition: fair  Hospital Course:  58 year old male presented to the hospital for planned robotic right radical nephrectomy for an enhancing right renal mass.  This is approximately 3.2 cm in size found incidentally during workup for his heart disease.  It had been determined that he would need surgery for his mitral valve, but that he would need to remain on anticoagulation following his heart surgery.  Therefore it was felt more appropriate to proceed with treating his kidney tumor.  He presented for robotic right radical nephrectomy which was uncomplicated.  He received prophylactic doses of heparin before and after surgery and while in the hospital, but it was not felt that treatment dose Lovenox would be appropriate at that point. He was initially admitted to the stepdown unit due to the high risk of his heart disease, but he had no issues.  He was able to be transferred to the floor the following day on telemetry.  Cardiology was consulted for perioperative care.  The patient recovered well and was able to ambulate, tolerate a regular diet, have return of bowel function, and control pain with oral medications.  Hemoglobin stabilized. Renal function worsened as expected following nephrectomy, and this was likely complicated by the fact that we could not give him IV hydration as well as normally with following nephrectomy due to his mitral valve.  Final creatinine prior to discharge was 2.28. He had some subjective shortness of breath without any desaturations on postoperative day 2, but a chest x-ray, no pulmonary edema.  He felt better the following day.  His heart never entered atrial fibrillation during the hospitalization.  It was felt that he could be  discharged home with follow-up for his renal function as an outpatient.  Consults: cardiology  Significant Diagnostic Studies: labs: Hemoglobin stabilized. Cr 2.28 on discharge.  Treatments: antibiotics: Ancef and surgery: Right robotic radical nephrectomy.  Discharge Exam: Blood pressure 156/86, pulse 69, temperature 97.8 F (36.6 C), temperature source Oral, resp. rate 18, height 5\' 8"  (1.727 m), weight 91.627 kg (202 lb), SpO2 98.00%. Refer to PE from progress note on date of discharge.  Disposition: 01-Home or Self Care  Discharge Instructions   Discharge patient    Complete by:  As directed             Medication List         amiodarone 400 MG tablet  Commonly known as:  PACERONE  Take 400 mg by mouth every morning.     bisacodyl 10 MG suppository  Commonly known as:  DULCOLAX  Place 1 suppository (10 mg total) rectally 2 (two) times daily.     diltiazem 120 MG 24 hr capsule  Commonly known as:  CARDIZEM CD  Take 120 mg by mouth every morning.     enoxaparin 100 MG/ML injection  Commonly known as:  LOVENOX  Inject 0.9 mLs (90 mg total) into the skin every 12 (twelve) hours. May resume on Tuesday, 09/12/13.     levothyroxine 50 MCG tablet  Commonly known as:  SYNTHROID, LEVOTHROID  Take 50 mcg by mouth daily before breakfast.     oxyCODONE 5 MG immediate release tablet  Commonly known as:  Oxy IR/ROXICODONE  Take 1-2 tablets (5-10 mg total) by mouth every 4 (four) hours as needed  for moderate pain.     senna-docusate 8.6-50 MG per tablet  Commonly known as:  Senokot-S  Take 1 tablet by mouth 2 (two) times daily.           Follow-up Information   Follow up with Sharyn Creamer, MD On 09/15/2013. (2:45 pm)    Specialty:  Urology   Contact information:   [place] [address] [phone]       Signed: Sharyn Creamer 09/08/2013, 7:54 AM

## 2013-09-08 NOTE — Progress Notes (Signed)
Urology Progress Note  Subjective:     No acute urologic events overnight. He denies SOB.  We reviewed the results of his pathology and I provided him with a copy of the report..  ROS: Negative chest pain or fever.  Objective:  Patient Vitals for the past 24 hrs:  BP Temp Temp src Pulse Resp SpO2  09/08/13 0446 156/86 mmHg 97.8 F (36.6 C) Oral 69 18 98 %  09/07/13 2311 150/69 mmHg - - - - -  09/07/13 2139 180/84 mmHg 98.3 F (36.8 C) Oral 76 18 98 %  09/07/13 1840 171/87 mmHg 98.3 F (36.8 C) Oral 76 18 97 %    Physical Exam: General:  No acute distress, awake Cardiovascular:    [x]   S1/S2 present, RRR  []   Irregularly irregular Chest:  CTA-B, no crackles. Abdomen:               []  Soft, appropriately TTP  []  Soft, NTTP  [x]  Soft, appropriately TTP, incision(s) c/d/i. Genitourinary: No edema. No catheter.     I/O last 3 completed shifts: In: 2090.8 [P.O.:1040; I.V.:1050.8] Out: 2675 [Urine:2675]  Recent Labs     09/05/13  1605   09/07/13  0445  09/08/13  0331  HGB  16.8  16.8   < >  14.5  14.1  WBC  27.9*   --    --    --   PLT  248   --    --    --    < > = values in this interval not displayed.    Recent Labs     09/07/13  0445  09/08/13  0331  NA  138  137  K  4.4  4.6  CL  103  102  CO2  22  22  BUN  25*  25*  CREATININE  2.16*  2.28*  CALCIUM  10.0  9.9  GFRNONAA  31*  29*  GFRAA  36*  34*     No results found for this basename: PT, INR, APTT,  in the last 72 hours   No components found with this basename: ABG,     Length of stay: 3 days.  Assessment: Right renal cell carcinoma. Stage pT1a NxMx POD#3 Robotic right radical nephrectomy.   Plan:  -Hemoglobin stabilized.  -Renal function is poor, but the difference between today's value and yesterday is minimal.   -Discharge home.   Rolan Bucco, MD [phone]

## 2013-09-08 NOTE — Progress Notes (Signed)
    Subjective:  Denies CP or dyspnea; abdominal pain  Objective:  Filed Vitals:   09/07/13 1840 09/07/13 2139 09/07/13 2311 09/08/13 0446  BP: 171/87 180/84 150/69 156/86  Pulse: 76 76  69  Temp: 98.3 F (36.8 C) 98.3 F (36.8 C)  97.8 F (36.6 C)  TempSrc: Oral Oral  Oral  Resp: 18 18  18   Height:      Weight:      SpO2: 97% 98%  98%    Intake/Output from previous day:  Intake/Output Summary (Last 24 hours) at 09/08/13 0715 Last data filed at 09/08/13 0600  Gross per 24 hour  Intake    680 ml  Output   1000 ml  Net   -320 ml    Physical Exam: Physical exam: Well-developed well-nourished in no acute distress.  Skin is warm and dry.  HEENT is normal.  Neck is supple.  Chest with mild expiratory wheeze Cardiovascular exam is regular rate and rhythm.  Abdominal exam status post abdominal surgery Extremities show trace edema. neuro grossly intact    Lab Results: Basic Metabolic Panel:  Recent Labs  09/07/13 0445 09/08/13 0331  NA 138 137  K 4.4 4.6  CL 103 102  CO2 22 22  GLUCOSE 130* 110*  BUN 25* 25*  CREATININE 2.16* 2.28*  CALCIUM 10.0 9.9   CBC:  Recent Labs  09/05/13 1605  09/07/13 0445 09/08/13 0331  WBC 27.9*  --   --   --   HGB 16.8  16.8  < > 14.5 14.1  HCT 50.0  49.3  < > 43.9 43.1  MCV 94.4  --   --   --   PLT 248  --   --   --   < > = values in this interval not displayed.   Assessment/Plan:  1 status post nephrectomy-management per urology. 2 history of paroxysmal atrial fibrillation-patient remains in sinus rhythm. Continue amiodarone and Cardizem. Coumadin is on hold because of recent abdominal surgery. 3 mitral stenosis-plans for mitral valve replacement and maze procedure once he recovers from nephrectomy. Dyspnea has resolved this morning. 4 acute renal failure-creatinine increased.  5 hypertension-blood pressure is elevated. Add hydralazine 25 mg po tid.  Patient appears to be doing reasonably well. He can be  discharged from a cardiac standpoint. Would resume Coumadin in 5 days if okay with urology. He should come to our office 3 days after initiating Coumadin to have his INR checked. He should followup with Dr. [name] in 2 weeks. Would recommend Lasix 40 mg daily as needed for increasing dyspnea or edema. He will need close followup of his renal function following discharge.  Kirk Ruths 09/08/2013, 7:15 AM

## 2013-09-18 ENCOUNTER — Telehealth: Payer: Self-pay | Admitting: Pharmacist

## 2013-09-18 MED ORDER — NEBIVOLOL HCL 2.5 MG PO TABS: 2.5000 mg | ORAL_TABLET | Freq: Every day | ORAL | Status: DC

## 2013-09-18 NOTE — Telephone Encounter (Signed)
New Message  Pt wife called.. Requests to speak with Dr. [name] about BP.  Wife states that the pt's BP has elevate since surgery and is continually climbing.. Should he resume his BP med's. Pt will also see his primary care, would Dr. [name] wish to address it or allow primary care.

## 2013-09-18 NOTE — Telephone Encounter (Signed)
Contacted pts wife back to inform her that per Dr [name], pt should start taking Bystolic 2.5 mg po daily for the HTN.  Confirmed pharmacy of choice with wife.  Wife verbalized understanding and agrees with this plan.

## 2013-09-18 NOTE — Telephone Encounter (Signed)
Yes he should restart Bystolic. How is he doing? Thank you, K

## 2013-09-18 NOTE — Telephone Encounter (Signed)
Spoke with pt's wife.  Pt was discharged last week after nephrectomy procedure.  Was told to hold Anticoagulation until 6/23 then start Lovenox.  He took his last dose of Lovenox today.  Will have him start Coumadin tonight- 10mg  x 2 then resume previous dose.  He has an appt to see Dr. [name] on 7/2.  Will check INR at that time.

## 2013-09-18 NOTE — Telephone Encounter (Signed)
Pts wife calling to ask if pt should start back on his Bystolic or not.  Wife states he was recently taken off of this due to recent surgery.  Wife states pt BP stays in the 254D systolic and 82M diastolic.  Wife states pt will see his PCP tomorrow and will see Dr [name] this Thursday 7/2.  Advised wife that I will let Dr [name] know this and advised her to follow-up with these appts and current treatment regimen.  Wife verbalized understanding and agrees with this plan.

## 2013-09-18 NOTE — Addendum Note (Signed)
Addended by: Nuala Alpha on: 09/18/2013 12:23 PM   Modules accepted: Orders

## 2013-09-19 ENCOUNTER — Encounter: Payer: Self-pay | Admitting: Cardiology

## 2013-09-21 ENCOUNTER — Ambulatory Visit (INDEPENDENT_AMBULATORY_CARE_PROVIDER_SITE_OTHER): Payer: Managed Care, Other (non HMO) | Admitting: *Deleted

## 2013-09-21 ENCOUNTER — Encounter: Payer: Self-pay | Admitting: Cardiology

## 2013-09-21 ENCOUNTER — Ambulatory Visit (INDEPENDENT_AMBULATORY_CARE_PROVIDER_SITE_OTHER): Payer: Managed Care, Other (non HMO) | Admitting: Cardiology

## 2013-09-21 ENCOUNTER — Telehealth: Payer: Self-pay | Admitting: *Deleted

## 2013-09-21 VITALS — BP 141/81 | HR 48 | Ht 68.0 in | Wt 194.0 lb

## 2013-09-21 DIAGNOSIS — I5033 Acute on chronic diastolic (congestive) heart failure: Secondary | ICD-10-CM

## 2013-09-21 DIAGNOSIS — I4891 Unspecified atrial fibrillation: Secondary | ICD-10-CM

## 2013-09-21 DIAGNOSIS — E785 Hyperlipidemia, unspecified: Secondary | ICD-10-CM

## 2013-09-21 DIAGNOSIS — Z5181 Encounter for therapeutic drug level monitoring: Secondary | ICD-10-CM

## 2013-09-21 DIAGNOSIS — E038 Other specified hypothyroidism: Secondary | ICD-10-CM

## 2013-09-21 DIAGNOSIS — I10 Essential (primary) hypertension: Secondary | ICD-10-CM

## 2013-09-21 DIAGNOSIS — I509 Heart failure, unspecified: Secondary | ICD-10-CM

## 2013-09-21 LAB — POCT INR: INR: 2.2

## 2013-09-21 MED ORDER — AMLODIPINE BESYLATE 10 MG PO TABS: 10.0000 mg | ORAL_TABLET | Freq: Every day | ORAL | Status: DC

## 2013-09-21 MED ORDER — LEVOTHYROXINE SODIUM 75 MCG PO TABS: 75.0000 ug | ORAL_TABLET | Freq: Every day | ORAL | Status: DC

## 2013-09-21 MED ORDER — ALPRAZOLAM 0.25 MG PO TABS: 0.2500 mg | ORAL_TABLET | Freq: Two times a day (BID) | ORAL | Status: DC | PRN

## 2013-09-21 MED ORDER — CITALOPRAM HYDROBROMIDE 10 MG PO TABS: 10.0000 mg | ORAL_TABLET | Freq: Every day | ORAL | Status: DC

## 2013-09-21 MED ORDER — ATORVASTATIN CALCIUM 10 MG PO TABS: 10.0000 mg | ORAL_TABLET | Freq: Every day | ORAL | Status: DC

## 2013-09-21 NOTE — Telephone Encounter (Signed)
Left detailed voice message on pt and wifes phone instructing him per Dr [name] to discontinue his cardizem 120 mg and start taking his amlodipine 10 mg po daily.  Called this med into pts pharmacy of choice and d/c the cardizem in the system.  Informed pt if he calls the office I will be out until next Tuesday, to ask for a triage nurse to go over newly endorsed meds.

## 2013-09-21 NOTE — Addendum Note (Signed)
Addended by: Nuala Alpha on: 09/21/2013 06:09 PM   Modules accepted: Orders, Medications

## 2013-09-21 NOTE — Patient Instructions (Addendum)
Your physician has recommended you make the following change in your medication:   START TAKING ATORVASTATIN 10 MG DAILY  START TAKING CELEXA 10 MG DAILY  START TAKING XANAX 0.25 MG TWICE DAILY AS NEEDED FOR ANXIETY  INCREASE YOUR SYNTHROID TO 27 MCG DAILY  Your physician recommends that you return for lab work in: [place] (TSH CHECKED)  Your physician recommends that you return for lab work in: IN 3 MONTHS TO CHECK (LFT & CPK)  Your physician recommends that you schedule a follow-up appointment in: WITH DR [name] IN 3 MONTHS

## 2013-09-21 NOTE — Progress Notes (Signed)
Patient ID: LAROY MUSTARD, male   DOB: [birth year], 58 y.o.   MRN: [id number]    Patient Name: Kevin English Date of Encounter: 09/21/2013  Primary Care Provider:  Gilford Rile, MD Primary Cardiologist:  Dorothy Spark  Problem List   Past Medical History  Diagnosis Date  . Hernia   . Chronic rhinitis   . Diverticulitis of colon 15 years ago  . Hyperlipidemia   . Hypogonadism male   . Leukocytosis   . Myalgia and myositis, unspecified   . Obesity   . OSA on CPAP   . Hypothyroidism   . PAF (paroxysmal atrial fibrillation)     a. sustained 160-180 on e-CARDIO monitor placed 07/29/2013. Placed on IV amiodarone at end of May 2015 due to continued paroxysms with RVR.  Marland Kitchen Rheumatic heart disease   . Chronic diastolic CHF (congestive heart failure)     a. Occurs in context of AF-RVR with mitral stenosis.  . Mitral stenosis     a. Dx 07/2013 - through workup of 2D echo, TEE, and cath, felt to be moderate.  . Paroxysmal atrial flutter   . CKD (chronic kidney disease), stage II   . CAD (coronary artery disease)     a. By cath 08/2013: 70% distal cx-prox LPDA; tandem mod LAD lesions, o/w mild dz.  . Renal mass     a. Dx 08/2013: concerning for renal cancer.  Marland Kitchen HTN (hypertension)     hx of- under control  . Stroke 1998    "MRI showed 3 mild strokes"  . Borderline diabetes   . Depression   . Anxiety   . Dizzy spells   . Deafness in left ear    Past Surgical History  Procedure Laterality Date  . Tee without cardioversion N/A 08/16/2013    Procedure: TRANSESOPHAGEAL ECHOCARDIOGRAM (TEE);  Surgeon: Dorothy Spark, MD;  Location: [place];  Service: Cardiovascular;  Laterality: N/A;  . Inguinal hernia repair Bilateral first one in 80's    "I had one side done twice"  . Cardiac catheterization  08/28/13  . Eye surgery Right 1990    "reconstructive, lens implant- from paintball accident"  . Robot assisted laparoscopic nephrectomy Right 09/05/2013    Procedure: ROBOTIC ASSISTED  LAPAROSCOPIC RIGHT RADICAL NEPHRECTOMY;  Surgeon: Sharyn Creamer, MD;  Location: [facility];  Service: Urology;  Laterality: Right;    Allergies  Allergies  Allergen Reactions  . Pravastatin Other (See Comments)    Myalgias     HPI  58 years old male with h/o HTN, hyperlipidemia, former smoker, who was diagnosed with moderate mitral stenosis leading into frequent atrial fibrillations and CHF. The patient was evaluated for mitral valve repair or replacement with maze procedure of the mini thoracotomy by Dr. Roxy Manns. Meanwhile he was started on amiodarone that keeps him in sinus rhythm. In the preoperative workup the patient was diagnosed with renal cell carcinoma and just underwent a robotic total nephrectomy. This is his first postoperative visit. Patient denies any chest pain palpitation or syncope. He doesn't think he had an episode of atrial fibrillation. He denies any significant shortness of breath, lower extremity edema orthopnea or personal nocturnal dyspnea. The patient has been depressed for a while and now feels even more down and is inquiring about antidepressant medication. He was seen by he is primary care physician and his TSH has been upper normal and so the question is if he Synthroid needs to be elevated considering he is using amiodarone.   Home  Medications  Prior to Admission medications   Medication Sig Start Date End Date Taking? Authorizing Provider  Probiotic Product (PROBIOTIC DAILY PO) Take by mouth.   Yes Historical Provider, MD  warfarin (COUMADIN) 5 MG tablet Take 5 mg by mouth daily.   Yes Historical Provider, MD  ALPRAZolam (XANAX) 0.25 MG tablet Take 1 tablet (0.25 mg total) by mouth 2 (two) times daily as needed for anxiety. 09/21/13   Dorothy Spark, MD  amiodarone (PACERONE) 400 MG tablet Take 400 mg by mouth every morning.    Historical Provider, MD  atorvastatin (LIPITOR) 10 MG tablet Take 1 tablet (10 mg total) by mouth daily. 09/21/13   Dorothy Spark,  MD  citalopram (CELEXA) 10 MG tablet Take 1 tablet (10 mg total) by mouth daily. 09/21/13   Dorothy Spark, MD  diltiazem (CARDIZEM CD) 120 MG 24 hr capsule Take 120 mg by mouth every morning.    Historical Provider, MD  levothyroxine (SYNTHROID) 75 MCG tablet Take 1 tablet (75 mcg total) by mouth daily before breakfast. 09/21/13   Dorothy Spark, MD  nebivolol (BYSTOLIC) 2.5 MG tablet Take 1 tablet (2.5 mg total) by mouth daily. 09/18/13   Dorothy Spark, MD    Family History  Family History  Problem Relation Age of Onset  . Hypertension    . Hypertension Mother     Social History  History   Social History  . Marital Status: Married    Spouse Name: N/A    Number of Children: N/A  . Years of Education: N/A   Occupational History  . Not on file.   Social History Main Topics  . Smoking status: Current Every Day Smoker -- 0.50 packs/day for 40 years    Types: Cigarettes  . Smokeless tobacco: Never Used  . Alcohol Use: No  . Drug Use: No  . Sexual Activity: Not Currently   Other Topics Concern  . Not on file   Social History Narrative  . No narrative on file     Review of Systems, as per HPI, otherwise negative General:  No chills, fever, night sweats or weight changes.  Cardiovascular:  No chest pain, dyspnea on exertion, edema, orthopnea, palpitations, paroxysmal nocturnal dyspnea. Dermatological: No rash, lesions/masses Respiratory: No cough, dyspnea Urologic: No hematuria, dysuria Abdominal:   No nausea, vomiting, diarrhea, bright red blood per rectum, melena, or hematemesis Neurologic:  No visual changes, wkns, changes in mental status. All other systems reviewed and are otherwise negative except as noted above.  Physical Exam  Blood pressure 141/81, pulse 48, height 5\' 8"  (1.727 m), weight 194 lb (87.998 kg).  General: Pleasant, NAD Psych: Normal affect. Neuro: Alert and oriented X 3. Moves all extremities spontaneously. HEENT: Normal  Neck: Supple  without bruits or JVD. Lungs:  Resp regular and unlabored, CTA. Heart: RRR no s3, s4, or murmurs. Abdomen: Soft, non-tender, non-distended, BS + x 4.  Extremities: No clubbing, cyanosis or edema. DP/PT/Radials 2+ and equal bilaterally.  Labs:  No results found for this basename: CKTOTAL, CKMB, TROPONINI,  in the last 72 hours Lab Results  Component Value Date   WBC 27.9* 09/05/2013   HGB 14.1 09/08/2013   HCT 43.1 09/08/2013   MCV 94.4 09/05/2013   PLT 248 09/05/2013    No results found for this basename: DDIMER   No components found with this basename: POCBNP,     Component Value Date/Time   NA 137 09/08/2013 0331   K 4.6 09/08/2013 0331  CL 102 09/08/2013 0331   CO2 22 09/08/2013 0331   GLUCOSE 110* 09/08/2013 0331   BUN 25* 09/08/2013 0331   CREATININE 2.28* 09/08/2013 0331   CALCIUM 9.9 09/08/2013 0331   PROT 7.4 08/09/2013 1300   ALBUMIN 3.7 08/09/2013 1300   AST 18 08/09/2013 1300   ALT 26 08/09/2013 1300   ALKPHOS 105 08/09/2013 1300   BILITOT 0.3 08/09/2013 1300   GFRNONAA 29* 09/08/2013 0331   GFRAA 34* 09/08/2013 0331   No results found for this basename: CHOL, HDL, LDLCALC, TRIG    Accessory Clinical Findings  Echocardiogram  - 08/16/2013 Study Conclusions  - Left ventricle: The cavity size was normal. Wall thickness was normal. Systolic function was normal. Wall motion was normal; there were no regional wall motion abnormalities. - Aortic valve: There was mild regurgitation. - Mitral valve: Valve area by pressure half-time: 1.4 cm^2. - Left atrium: The atrium was moderately dilated. No evidence of thrombus in the atrial cavity or appendage. No evidence of thrombus in the atrial cavity or appendage. - Right atrium: No evidence of thrombus in the atrial cavity or appendage.  Impressions:  - Rheumatic mitral valve with moderate leaflet tip thickening and minimal calcifications. Moderate mitral stenosis and trace mitral egurgitation.  ECG - sinus bradycardia, 43  beats per minute, left axis deviation and right bundle branch block.    Assessment/Plan:  58 year old gentleman with moderate mitral stenosis and paroxysmal atrial fibrillation with RVR leading to frequent CHF. The patient underwent total right nephrectomy for renal cell cancer with ill-defined borders and is recovering well.  1. Mitral stenosis - we will for now hold the surgery until the patient recovers. If his symptoms recur he will be scheduled for mitral valve repair/replacement and maze procedure via minithoracotomy.  2. Paroxysmal atrial fibrillation - currently in sinus bradycardia, we'll continue amiodarone and Coumadin.  3. Hypothyroidism - with upper normal TSH, we'll increase his Synthroid to 75 mcg daily. We will recheck TSH in 3 months.  4. Depression and anxiety - we'll start patient on low-dose of SSRI Celexa 10 mg daily and increased possibly in a month's is no fracture seen. We have to be cautious with the higher dose considering he is on amiodarone and might have prolonged QT. We will also prescribe him a very low dose of Xanax 0.25 mg twice a day when necessary for just one month's.  5. Lipids - elevated, we'll start after rest up in 10 mg daily and check liver enzymes and CPK in 3 weeks.  6. elevated creatinine - post nephrectomy we will follow and ideally would like to see a return to normal prior to the surgery  7. Profound sinus bradycardia - even after decreasing the dose of Bystolic to 2.5, we will D/C Cardizem and start Amlodipine 5 mg po daily.  Followup in 3 months  Dorothy Spark, MD, [facility] 09/21/2013, 4:03 PM

## 2013-09-25 ENCOUNTER — Telehealth: Payer: Self-pay | Admitting: *Deleted

## 2013-09-25 NOTE — Telephone Encounter (Signed)
Would you call the pharmacy and change his Celexa to Zoloft 50 mg po daily? Also, he will need to come for an ECG 1 week after starting Zoloft? If QT remains the same he should continue using the same dose. Thank you, Ena Dawley

## 2013-09-25 NOTE — Telephone Encounter (Signed)
[facility] pharmacy called and wanted to make Dr [name] aware of a potential interaction between the citalopram and the amiodarone. Trent for patient to be on these together? Please advise. Thanks, MI

## 2013-09-26 MED ORDER — SERTRALINE HCL 50 MG PO TABS: 50.0000 mg | ORAL_TABLET | Freq: Every day | ORAL | Status: DC

## 2013-09-26 NOTE — Telephone Encounter (Signed)
Contacted pt about the pharmacy contacting Dr [name] about switching his celexa to Zoloft 50 mg daily due to drug interaction.  Informed pt that I will call this new med order into pharmacy of choice.  Informed pt that also per Dr [name] he should come in for a EKG (nurse visit) in one week after starting the Zoloft to have QT interval monitored.  Per Dr [name] if the QT interval on the EKG remains the same, then he should continue using the same dose of Zoloft.  Scheduled this EKG for 7/15 at 10 am per wife and Husbands request.  Pt verbalized understanding and agrees with this plan.

## 2013-09-27 ENCOUNTER — Telehealth: Payer: Self-pay | Admitting: Cardiology

## 2013-09-27 ENCOUNTER — Ambulatory Visit (INDEPENDENT_AMBULATORY_CARE_PROVIDER_SITE_OTHER): Payer: Managed Care, Other (non HMO) | Admitting: *Deleted

## 2013-09-27 DIAGNOSIS — I4891 Unspecified atrial fibrillation: Secondary | ICD-10-CM

## 2013-09-27 DIAGNOSIS — Z5181 Encounter for therapeutic drug level monitoring: Secondary | ICD-10-CM

## 2013-09-27 DIAGNOSIS — I5033 Acute on chronic diastolic (congestive) heart failure: Secondary | ICD-10-CM

## 2013-09-27 DIAGNOSIS — I509 Heart failure, unspecified: Secondary | ICD-10-CM

## 2013-09-27 LAB — POCT INR: INR: 2.9

## 2013-09-27 NOTE — Telephone Encounter (Signed)
New message ° ° ° ° ° °Talk to Ivy °

## 2013-09-27 NOTE — Telephone Encounter (Signed)
Spoke with pts PCP about recent OV with Dr [name] on 7/2. Faxed requested by PCP of last OV with [name], sent on 7/8 at 5:00pm.

## 2013-10-04 ENCOUNTER — Ambulatory Visit (INDEPENDENT_AMBULATORY_CARE_PROVIDER_SITE_OTHER): Payer: Managed Care, Other (non HMO) | Admitting: Pharmacist

## 2013-10-04 ENCOUNTER — Ambulatory Visit (INDEPENDENT_AMBULATORY_CARE_PROVIDER_SITE_OTHER): Payer: Managed Care, Other (non HMO) | Admitting: *Deleted

## 2013-10-04 DIAGNOSIS — I4891 Unspecified atrial fibrillation: Secondary | ICD-10-CM

## 2013-10-04 DIAGNOSIS — Z5181 Encounter for therapeutic drug level monitoring: Secondary | ICD-10-CM

## 2013-10-04 DIAGNOSIS — I480 Paroxysmal atrial fibrillation: Secondary | ICD-10-CM

## 2013-10-04 DIAGNOSIS — I509 Heart failure, unspecified: Secondary | ICD-10-CM

## 2013-10-04 DIAGNOSIS — I5033 Acute on chronic diastolic (congestive) heart failure: Secondary | ICD-10-CM

## 2013-10-04 LAB — POCT INR: INR: 3.8

## 2013-10-04 NOTE — Progress Notes (Signed)
EKG done after starting Zoloft 09/26/13. Reviewed by Dr Earnie Larsson changes.  Pt advised.

## 2013-10-11 ENCOUNTER — Other Ambulatory Visit (INDEPENDENT_AMBULATORY_CARE_PROVIDER_SITE_OTHER): Payer: Managed Care, Other (non HMO)

## 2013-10-11 ENCOUNTER — Ambulatory Visit (INDEPENDENT_AMBULATORY_CARE_PROVIDER_SITE_OTHER): Payer: Managed Care, Other (non HMO) | Admitting: Pharmacist

## 2013-10-11 DIAGNOSIS — E038 Other specified hypothyroidism: Secondary | ICD-10-CM

## 2013-10-11 DIAGNOSIS — I5033 Acute on chronic diastolic (congestive) heart failure: Secondary | ICD-10-CM

## 2013-10-11 DIAGNOSIS — I509 Heart failure, unspecified: Secondary | ICD-10-CM

## 2013-10-11 DIAGNOSIS — E785 Hyperlipidemia, unspecified: Secondary | ICD-10-CM

## 2013-10-11 DIAGNOSIS — I4891 Unspecified atrial fibrillation: Secondary | ICD-10-CM

## 2013-10-11 DIAGNOSIS — Z5181 Encounter for therapeutic drug level monitoring: Secondary | ICD-10-CM

## 2013-10-11 DIAGNOSIS — I10 Essential (primary) hypertension: Secondary | ICD-10-CM

## 2013-10-11 LAB — HEPATIC FUNCTION PANEL
ALT: 49 U/L (ref 0–53)
AST: 22 U/L (ref 0–37)
Albumin: 3.8 g/dL (ref 3.5–5.2)
Alkaline Phosphatase: 91 U/L (ref 39–117)
Bilirubin, Direct: 0 mg/dL (ref 0.0–0.3)
Total Bilirubin: 0.5 mg/dL (ref 0.2–1.2)
Total Protein: 6.8 g/dL (ref 6.0–8.3)

## 2013-10-11 LAB — POCT INR: INR: 3.5

## 2013-10-11 LAB — CK: Total CK: 36 U/L (ref 7–232)

## 2013-10-11 LAB — TSH: TSH: 0.52 u[IU]/mL (ref 0.35–4.50)

## 2013-10-13 ENCOUNTER — Other Ambulatory Visit: Payer: Self-pay | Admitting: *Deleted

## 2013-10-13 ENCOUNTER — Telehealth: Payer: Self-pay

## 2013-10-13 NOTE — Telephone Encounter (Deleted)
The pt states that because he has gout at this time his PCP has prescribed him Prednisone to take for 5 days. He is scheduled to have a TEE and heart cath on 7/28, he wants to know if it is ok to take Prednisone and have procedures.

## 2013-10-13 NOTE — Telephone Encounter (Deleted)
Per Dr Mare Ferrari, DOD, the pt is advised that it is ok to take Prednisone as prescribed by his PCP.

## 2013-10-16 ENCOUNTER — Encounter: Payer: Self-pay | Admitting: Cardiology

## 2013-10-16 ENCOUNTER — Ambulatory Visit (INDEPENDENT_AMBULATORY_CARE_PROVIDER_SITE_OTHER): Payer: Managed Care, Other (non HMO) | Admitting: Cardiology

## 2013-10-16 VITALS — BP 130/71 | HR 83 | Ht 68.0 in | Wt 197.0 lb

## 2013-10-16 DIAGNOSIS — R5383 Other fatigue: Secondary | ICD-10-CM

## 2013-10-16 DIAGNOSIS — I2510 Atherosclerotic heart disease of native coronary artery without angina pectoris: Secondary | ICD-10-CM

## 2013-10-16 DIAGNOSIS — R5381 Other malaise: Secondary | ICD-10-CM

## 2013-10-16 DIAGNOSIS — E785 Hyperlipidemia, unspecified: Secondary | ICD-10-CM

## 2013-10-16 LAB — BASIC METABOLIC PANEL
BUN: 19 mg/dL (ref 6–23)
CO2: 23 mEq/L (ref 19–32)
Calcium: 9.2 mg/dL (ref 8.4–10.5)
Chloride: 106 mEq/L (ref 96–112)
Creatinine, Ser: 2.1 mg/dL — ABNORMAL HIGH (ref 0.4–1.5)
GFR: 33.81 mL/min — ABNORMAL LOW (ref >60.00)
Glucose, Bld: 178 mg/dL — ABNORMAL HIGH (ref 70–99)
Potassium: 3.8 mEq/L (ref 3.5–5.1)
Sodium: 138 mEq/L (ref 135–145)

## 2013-10-16 MED ORDER — AMIODARONE HCL 200 MG PO TABS: 200.0000 mg | ORAL_TABLET | Freq: Every morning | ORAL | Status: DC

## 2013-10-16 NOTE — Progress Notes (Signed)
Patient ID: Kevin English, male   DOB: Nov 15, 1955, 58 y.o.   MRN: [id number] Patient ID: Kevin English, male   DOB: [birth year], 58 y.o.   MRN: [id number]    Patient Name: Kevin English Date of Encounter: 10/16/2013  Primary Care Provider:  Gilford Rile, MD Primary Cardiologist:  Dorothy Spark  Problem List   Past Medical History  Diagnosis Date  . Hernia   . Chronic rhinitis   . Diverticulitis of colon 15 years ago  . Hyperlipidemia   . Hypogonadism male   . Leukocytosis   . Myalgia and myositis, unspecified   . Obesity   . OSA on CPAP   . Hypothyroidism   . PAF (paroxysmal atrial fibrillation)     a. sustained 160-180 on e-CARDIO monitor placed 07/29/2013. Placed on IV amiodarone at end of May 2015 due to continued paroxysms with RVR.  Marland Kitchen Rheumatic heart disease   . Chronic diastolic CHF (congestive heart failure)     a. Occurs in context of AF-RVR with mitral stenosis.  . Mitral stenosis     a. Dx 07/2013 - through workup of 2D echo, TEE, and cath, felt to be moderate.  . Paroxysmal atrial flutter   . CKD (chronic kidney disease), stage II   . CAD (coronary artery disease)     a. By cath 08/2013: 70% distal cx-prox LPDA; tandem mod LAD lesions, o/w mild dz.  . Renal mass     a. Dx 08/2013: concerning for renal cancer.  Marland Kitchen HTN (hypertension)     hx of- under control  . Stroke 1998    "MRI showed 3 mild strokes"  . Borderline diabetes   . Depression   . Anxiety   . Dizzy spells   . Deafness in left ear    Past Surgical History  Procedure Laterality Date  . Tee without cardioversion N/A 08/16/2013    Procedure: TRANSESOPHAGEAL ECHOCARDIOGRAM (TEE);  Surgeon: Dorothy Spark, MD;  Location: [place];  Service: Cardiovascular;  Laterality: N/A;  . Inguinal hernia repair Bilateral first one in 80's    "I had one side done twice"  . Cardiac catheterization  08/28/13  . Eye surgery Right 1990    "reconstructive, lens implant- from paintball accident"  . Robot assisted  laparoscopic nephrectomy Right 09/05/2013    Procedure: ROBOTIC ASSISTED LAPAROSCOPIC RIGHT RADICAL NEPHRECTOMY;  Surgeon: Sharyn Creamer, MD;  Location: [facility];  Service: Urology;  Laterality: Right;    Allergies  Allergies  Allergen Reactions  . Pravastatin Other (See Comments)    Myalgias     HPI  58 years old male with h/o HTN, hyperlipidemia, former smoker, who was diagnosed with moderate mitral stenosis leading into frequent atrial fibrillations and CHF. The patient was evaluated for mitral valve repair or replacement with maze procedure of the mini thoracotomy by Dr. Roxy Manns. Meanwhile he was started on amiodarone that keeps him in sinus rhythm. In the preoperative workup the patient was diagnosed with renal cell carcinoma and just underwent a robotic total nephrectomy. This is his first postoperative visit. Patient denies any chest pain palpitation or syncope. He doesn't think he had an episode of atrial fibrillation. He denies any significant shortness of breath, lower extremity edema orthopnea or personal nocturnal dyspnea. The patient has been depressed for a while and now feels even more down and is inquiring about antidepressant medication. He was seen by he is primary care physician and his TSH has been upper normal and so the question  is if he Synthroid needs to be elevated considering he is using amiodarone.  Follow up after 1 month - 10/16/13 - he feels significantly better, no SOB or CP, he started to walk with his wife. Muscle pain resolved with increased hydration. Normal labs the last week - LFTs and CPK. Improved mood and depression. Normal QT/QTc after 2 weeks of Zoloft.    Home Medications  Prior to Admission medications   Medication Sig Start Date End Date Taking? Authorizing Provider  Probiotic Product (PROBIOTIC DAILY PO) Take by mouth.   Yes Historical Provider, MD  warfarin (COUMADIN) 5 MG tablet Take 5 mg by mouth daily.   Yes Historical Provider, MD    ALPRAZolam (XANAX) 0.25 MG tablet Take 1 tablet (0.25 mg total) by mouth 2 (two) times daily as needed for anxiety. 09/21/13   Dorothy Spark, MD  amiodarone (PACERONE) 400 MG tablet Take 400 mg by mouth every morning.    Historical Provider, MD  atorvastatin (LIPITOR) 10 MG tablet Take 1 tablet (10 mg total) by mouth daily. 09/21/13   Dorothy Spark, MD  citalopram (CELEXA) 10 MG tablet Take 1 tablet (10 mg total) by mouth daily. 09/21/13   Dorothy Spark, MD  diltiazem (CARDIZEM CD) 120 MG 24 hr capsule Take 120 mg by mouth every morning.    Historical Provider, MD  levothyroxine (SYNTHROID) 75 MCG tablet Take 1 tablet (75 mcg total) by mouth daily before breakfast. 09/21/13   Dorothy Spark, MD  nebivolol (BYSTOLIC) 2.5 MG tablet Take 1 tablet (2.5 mg total) by mouth daily. 09/18/13   Dorothy Spark, MD    Family History  Family History  Problem Relation Age of Onset  . Hypertension    . Hypertension Mother     Social History  History   Social History  . Marital Status: Married    Spouse Name: N/A    Number of Children: N/A  . Years of Education: N/A   Occupational History  . Not on file.   Social History Main Topics  . Smoking status: Current Every Day Smoker -- 0.50 packs/day for 40 years    Types: Cigarettes  . Smokeless tobacco: Never Used  . Alcohol Use: No  . Drug Use: No  . Sexual Activity: Not Currently   Other Topics Concern  . Not on file   Social History Narrative  . No narrative on file     Review of Systems, as per HPI, otherwise negative General:  No chills, fever, night sweats or weight changes.  Cardiovascular:  No chest pain, dyspnea on exertion, edema, orthopnea, palpitations, paroxysmal nocturnal dyspnea. Dermatological: No rash, lesions/masses Respiratory: No cough, dyspnea Urologic: No hematuria, dysuria Abdominal:   No nausea, vomiting, diarrhea, bright red blood per rectum, melena, or hematemesis Neurologic:  No visual changes,  wkns, changes in mental status. All other systems reviewed and are otherwise negative except as noted above.  Physical Exam  Blood pressure 130/71, pulse 83, height 5\' 8"  (1.727 m), weight 197 lb (89.359 kg).  General: Pleasant, NAD Psych: Normal affect. Neuro: Alert and oriented X 3. Moves all extremities spontaneously. HEENT: Normal  Neck: Supple without bruits or JVD. Lungs:  Resp regular and unlabored, CTA. Heart: RRR no s3, s4, or murmurs. Abdomen: Soft, non-tender, non-distended, BS + x 4.  Extremities: No clubbing, cyanosis or edema. DP/PT/Radials 2+ and equal bilaterally.  Labs:  No results found for this basename: CKTOTAL, CKMB, TROPONINI,  in the last 72 hours Lab  Results  Component Value Date   WBC 27.9* 09/05/2013   HGB 14.1 09/08/2013   HCT 43.1 09/08/2013   MCV 94.4 09/05/2013   PLT 248 09/05/2013    No results found for this basename: DDIMER   No components found with this basename: POCBNP,     Component Value Date/Time   NA 137 09/08/2013 0331   K 4.6 09/08/2013 0331   CL 102 09/08/2013 0331   CO2 22 09/08/2013 0331   GLUCOSE 110* 09/08/2013 0331   BUN 25* 09/08/2013 0331   CREATININE 2.28* 09/08/2013 0331   CALCIUM 9.9 09/08/2013 0331   PROT 6.8 10/11/2013 0950   ALBUMIN 3.8 10/11/2013 0950   AST 22 10/11/2013 0950   ALT 49 10/11/2013 0950   ALKPHOS 91 10/11/2013 0950   BILITOT 0.5 10/11/2013 0950   GFRNONAA 29* 09/08/2013 0331   GFRAA 34* 09/08/2013 0331   No results found for this basename: CHOL,  HDL,  LDLCALC,  TRIG    Accessory Clinical Findings  Echocardiogram  - 08/16/2013 Study Conclusions  - Left ventricle: The cavity size was normal. Wall thickness was normal. Systolic function was normal. Wall motion was normal; there were no regional wall motion abnormalities. - Aortic valve: There was mild regurgitation. - Mitral valve: Valve area by pressure half-time: 1.4 cm^2. - Left atrium: The atrium was moderately dilated. No evidence of thrombus in the  atrial cavity or appendage. No evidence of thrombus in the atrial cavity or appendage. - Right atrium: No evidence of thrombus in the atrial cavity or appendage.  Impressions:  - Rheumatic mitral valve with moderate leaflet tip thickening and minimal calcifications. Moderate mitral stenosis and trace mitral egurgitation.  ECG - sinus bradycardia, 43 beats per minute, left axis deviation and right bundle branch block.    Assessment/Plan:  58 year old gentleman with moderate mitral stenosis and paroxysmal atrial fibrillation with RVR leading to frequent CHF. The patient underwent total right nephrectomy for renal cell cancer with ill-defined borders and is recovering well.  1. Mitral stenosis - we will for now hold the surgery until the patient recovers. If his symptoms recur he will be scheduled for mitral valve repair/replacement and maze procedure via minithoracotomy.  2. Paroxysmal atrial fibrillation - currently in sinus bradycardia, we'll continue amiodarone and Coumadin.  3. Hypothyroidism - with upper normal TSH, we'll increase his Synthroid to 75 mcg daily. We will recheck TSH in 3 months.  4. Depression and anxiety - onn SSRI Zoloft 10 mg daily. We have to be cautious with the higher dose considering he is on amiodarone and might have prolonged QT. Contnue Xanax PRN.  Normal QT/QTc today.   5. Lipids - elevated, started after rest up in 10 mg daily, normal liver enzymes and CPK.  6. eEevated creatinine - post nephrectomy, recheck today.    7. Profound sinus bradycardia - resolved after decreasing the dose of Bystolic to 2.5, D/C Cardizem, started Amlodipine 5 mg po daily.  Followup in 3 months with CMP, lipids.  Dorothy Spark, MD, [facility] 09/21/2013, 4:03 PM

## 2013-10-16 NOTE — Patient Instructions (Addendum)
Your physician has recommended you make the following change in your medication: decrease Amiodarone to 200 mg daily   Your physician recommends that you return for lab work in: today and in 3 months just before your next office visit (please do not eat or drink after midnight the night before labs are drawn.)  Your physician recommends that you schedule a follow-up appointment in: 3 months

## 2013-10-20 ENCOUNTER — Ambulatory Visit (INDEPENDENT_AMBULATORY_CARE_PROVIDER_SITE_OTHER): Payer: Managed Care, Other (non HMO) | Admitting: Pharmacist

## 2013-10-20 DIAGNOSIS — I5033 Acute on chronic diastolic (congestive) heart failure: Secondary | ICD-10-CM

## 2013-10-20 DIAGNOSIS — I509 Heart failure, unspecified: Secondary | ICD-10-CM

## 2013-10-20 DIAGNOSIS — I4891 Unspecified atrial fibrillation: Secondary | ICD-10-CM

## 2013-10-20 DIAGNOSIS — Z5181 Encounter for therapeutic drug level monitoring: Secondary | ICD-10-CM

## 2013-10-20 LAB — POCT INR: INR: 2.4

## 2013-11-03 ENCOUNTER — Ambulatory Visit (INDEPENDENT_AMBULATORY_CARE_PROVIDER_SITE_OTHER): Payer: Managed Care, Other (non HMO) | Admitting: Pharmacist

## 2013-11-03 DIAGNOSIS — Z5181 Encounter for therapeutic drug level monitoring: Secondary | ICD-10-CM

## 2013-11-03 DIAGNOSIS — I509 Heart failure, unspecified: Secondary | ICD-10-CM

## 2013-11-03 DIAGNOSIS — I4891 Unspecified atrial fibrillation: Secondary | ICD-10-CM

## 2013-11-03 DIAGNOSIS — I5033 Acute on chronic diastolic (congestive) heart failure: Secondary | ICD-10-CM

## 2013-11-03 LAB — POCT INR: INR: 2.5

## 2013-11-08 ENCOUNTER — Telehealth: Payer: Self-pay | Admitting: Cardiology

## 2013-11-08 ENCOUNTER — Telehealth: Payer: Self-pay | Admitting: *Deleted

## 2013-11-08 DIAGNOSIS — I10 Essential (primary) hypertension: Secondary | ICD-10-CM

## 2013-11-08 DIAGNOSIS — E785 Hyperlipidemia, unspecified: Secondary | ICD-10-CM

## 2013-11-08 DIAGNOSIS — I4891 Unspecified atrial fibrillation: Secondary | ICD-10-CM

## 2013-11-08 DIAGNOSIS — E038 Other specified hypothyroidism: Secondary | ICD-10-CM

## 2013-11-08 DIAGNOSIS — I5033 Acute on chronic diastolic (congestive) heart failure: Secondary | ICD-10-CM

## 2013-11-08 MED ORDER — ALPRAZOLAM 0.25 MG PO TABS: 0.2500 mg | ORAL_TABLET | Freq: Two times a day (BID) | ORAL | Status: DC | PRN

## 2013-11-08 NOTE — Telephone Encounter (Signed)
Informed the pt and wife to make them aware that Dr Delight Ovens for the pts Xanax to be refilled x1 month.  Sent a verbal order to pts pharmacy of choice, and spoke with Lanny Hurst the PharmD. Both verbalized understanding and pleased with all the assistance provided.

## 2013-11-08 NOTE — Telephone Encounter (Signed)
That's ok with me.

## 2013-11-08 NOTE — Telephone Encounter (Signed)
Patients wife requests xanax refill for patient. She asks that if you would please authorize one more 30 day supply, he will follow up with his pcp for further refills. Thanks, MI

## 2013-11-08 NOTE — Telephone Encounter (Deleted)
Error

## 2013-11-08 NOTE — Telephone Encounter (Signed)
Will route this to Dr [name] for the ok to refill

## 2013-11-29 ENCOUNTER — Ambulatory Visit (INDEPENDENT_AMBULATORY_CARE_PROVIDER_SITE_OTHER): Payer: Managed Care, Other (non HMO) | Admitting: *Deleted

## 2013-11-29 DIAGNOSIS — I4891 Unspecified atrial fibrillation: Secondary | ICD-10-CM

## 2013-11-29 DIAGNOSIS — Z5181 Encounter for therapeutic drug level monitoring: Secondary | ICD-10-CM

## 2013-11-29 DIAGNOSIS — I509 Heart failure, unspecified: Secondary | ICD-10-CM

## 2013-11-29 DIAGNOSIS — I5033 Acute on chronic diastolic (congestive) heart failure: Secondary | ICD-10-CM

## 2013-11-29 LAB — POCT INR: INR: 2.1

## 2013-12-18 ENCOUNTER — Telehealth: Payer: Self-pay | Admitting: Cardiology

## 2013-12-18 NOTE — Telephone Encounter (Signed)
°  Pt's wife called in she is concerned. Patient fell yesterday. He is not feeling good. And she feels he is not getting any better. Please call and advise. Wife's work # is [phone].

## 2013-12-18 NOTE — Telephone Encounter (Signed)
Spoke with wife . Pt continue being weak, he fell when getting out of the  shower yesterday. According to wife pt is not improving. Pt wants to be seen. I offered an appointment for this afternoon. Wife states could not come today, because she is working. An appointment was made for Friday 12/22/13 AT 1:45 pm WIFE IS AWARE.

## 2013-12-21 ENCOUNTER — Other Ambulatory Visit: Payer: Self-pay | Admitting: *Deleted

## 2013-12-22 ENCOUNTER — Ambulatory Visit (INDEPENDENT_AMBULATORY_CARE_PROVIDER_SITE_OTHER): Payer: Managed Care, Other (non HMO) | Admitting: Cardiology

## 2013-12-22 ENCOUNTER — Encounter: Payer: Self-pay | Admitting: Cardiology

## 2013-12-22 ENCOUNTER — Other Ambulatory Visit: Payer: Self-pay | Admitting: *Deleted

## 2013-12-22 VITALS — BP 124/76 | HR 48 | Ht 68.0 in | Wt 201.0 lb

## 2013-12-22 DIAGNOSIS — I10 Essential (primary) hypertension: Secondary | ICD-10-CM

## 2013-12-22 DIAGNOSIS — I4891 Unspecified atrial fibrillation: Secondary | ICD-10-CM

## 2013-12-22 DIAGNOSIS — E785 Hyperlipidemia, unspecified: Secondary | ICD-10-CM

## 2013-12-22 LAB — COMPREHENSIVE METABOLIC PANEL
ALT: 64 U/L — ABNORMAL HIGH (ref 0–53)
AST: 35 U/L (ref 0–37)
Albumin: 4.1 g/dL (ref 3.5–5.2)
Alkaline Phosphatase: 113 U/L (ref 39–117)
BUN: 20 mg/dL (ref 6–23)
CO2: 23 mEq/L (ref 19–32)
Calcium: 9.8 mg/dL (ref 8.4–10.5)
Chloride: 104 mEq/L (ref 96–112)
Creatinine, Ser: 2 mg/dL — ABNORMAL HIGH (ref 0.4–1.5)
GFR: 36.54 mL/min — ABNORMAL LOW (ref >60.00)
Glucose, Bld: 95 mg/dL (ref 70–99)
Potassium: 4.3 mEq/L (ref 3.5–5.1)
Sodium: 134 mEq/L — ABNORMAL LOW (ref 135–145)
Total Bilirubin: 0.5 mg/dL (ref 0.2–1.2)
Total Protein: 7.8 g/dL (ref 6.0–8.3)

## 2013-12-22 LAB — CK: Total CK: 64 U/L (ref 7–232)

## 2013-12-22 NOTE — Progress Notes (Signed)
Patient ID: Kevin English, male   DOB: 1955/10/24, 58 y.o.   MRN: [id number]    Patient Name: Kevin English Date of Encounter: 12/22/2013  Primary Care Provider:  Gilford Rile, MD Primary Cardiologist:  Dorothy Spark  Problem List   Past Medical History  Diagnosis Date  . Hernia   . Chronic rhinitis   . Diverticulitis of colon 15 years ago  . Hyperlipidemia   . Hypogonadism male   . Leukocytosis   . Myalgia and myositis, unspecified   . Obesity   . OSA on CPAP   . Hypothyroidism   . PAF (paroxysmal atrial fibrillation)     a. sustained 160-180 on e-CARDIO monitor placed 07/29/2013. Placed on IV amiodarone at end of May 2015 due to continued paroxysms with RVR.  Marland Kitchen Rheumatic heart disease   . Chronic diastolic CHF (congestive heart failure)     a. Occurs in context of AF-RVR with mitral stenosis.  . Mitral stenosis     a. Dx 07/2013 - through workup of 2D echo, TEE, and cath, felt to be moderate.  . Paroxysmal atrial flutter   . CKD (chronic kidney disease), stage II   . CAD (coronary artery disease)     a. By cath 08/2013: 70% distal cx-prox LPDA; tandem mod LAD lesions, o/w mild dz.  . Renal mass     a. Dx 08/2013: concerning for renal cancer.  Marland Kitchen HTN (hypertension)     hx of- under control  . Stroke 1998    "MRI showed 3 mild strokes"  . Borderline diabetes   . Depression   . Anxiety   . Dizzy spells   . Deafness in left ear    Past Surgical History  Procedure Laterality Date  . Tee without cardioversion N/A 08/16/2013    Procedure: TRANSESOPHAGEAL ECHOCARDIOGRAM (TEE);  Surgeon: Dorothy Spark, MD;  Location: [facility];  Service: Cardiovascular;  Laterality: N/A;  . Inguinal hernia repair Bilateral first one in 80's    "I had one side done twice"  . Cardiac catheterization  08/28/13  . Eye surgery Right 1990    "reconstructive, lens implant- from paintball accident"  . Robot assisted laparoscopic nephrectomy Right 09/05/2013    Procedure: ROBOTIC ASSISTED  LAPAROSCOPIC RIGHT RADICAL NEPHRECTOMY;  Surgeon: Sharyn Creamer, MD;  Location: [facility];  Service: Urology;  Laterality: Right;    Allergies  Allergies  Allergen Reactions  . Pravastatin Other (See Comments)    Myalgias     HPI  58 years old male with h/o HTN, hyperlipidemia, former smoker, who was diagnosed with moderate mitral stenosis leading into frequent atrial fibrillations and CHF. The patient was evaluated for mitral valve repair or replacement with maze procedure of the mini thoracotomy by Dr. Roxy Manns. Meanwhile he was started on amiodarone that keeps him in sinus rhythm. In the preoperative workup the patient was diagnosed with renal cell carcinoma and just underwent a robotic total nephrectomy. This is his first postoperative visit. Patient denies any chest pain palpitation or syncope. He doesn't think he had an episode of atrial fibrillation. He denies any significant shortness of breath, lower extremity edema orthopnea or personal nocturnal dyspnea. The patient has been depressed for a while and now feels even more down and is inquiring about antidepressant medication. He was seen by he is primary care physician and his TSH has been upper normal and so the question is if he Synthroid needs to be elevated considering he is using amiodarone.  Follow up  after 1 month - 10/16/13 - he feels significantly better, no SOB or CP, he started to walk with his wife. Muscle pain resolved with increased hydration. Normal labs the last week - LFTs and CPK. Improved mood and depression. Normal QT/QTc after 2 weeks of Zoloft.   12/22/2013 - he doesn't feel well, profound fatigue. Feeling down. If he walks he gets really SOB. NO symptoms of CHF such as LE edema, orthopnea, PND. No angina. No palpitations.   Home Medications  Prior to Admission medications   Medication Sig Start Date End Date Taking? Authorizing Provider  Probiotic Product (PROBIOTIC DAILY PO) Take by mouth.   Yes Historical  Provider, MD  warfarin (COUMADIN) 5 MG tablet Take 5 mg by mouth daily.   Yes Historical Provider, MD  ALPRAZolam (XANAX) 0.25 MG tablet Take 1 tablet (0.25 mg total) by mouth 2 (two) times daily as needed for anxiety. 09/21/13   Dorothy Spark, MD  amiodarone (PACERONE) 400 MG tablet Take 400 mg by mouth every morning.    Historical Provider, MD  atorvastatin (LIPITOR) 10 MG tablet Take 1 tablet (10 mg total) by mouth daily. 09/21/13   Dorothy Spark, MD  citalopram (CELEXA) 10 MG tablet Take 1 tablet (10 mg total) by mouth daily. 09/21/13   Dorothy Spark, MD  diltiazem (CARDIZEM CD) 120 MG 24 hr capsule Take 120 mg by mouth every morning.    Historical Provider, MD  levothyroxine (SYNTHROID) 75 MCG tablet Take 1 tablet (75 mcg total) by mouth daily before breakfast. 09/21/13   Dorothy Spark, MD  nebivolol (BYSTOLIC) 2.5 MG tablet Take 1 tablet (2.5 mg total) by mouth daily. 09/18/13   Dorothy Spark, MD    Family History  Family History  Problem Relation Age of Onset  . Hypertension    . Hypertension Mother     Social History  History   Social History  . Marital Status: Married    Spouse Name: N/A    Number of Children: N/A  . Years of Education: N/A   Occupational History  . Not on file.   Social History Main Topics  . Smoking status: Current Every Day Smoker -- 0.50 packs/day for 40 years    Types: Cigarettes  . Smokeless tobacco: Never Used  . Alcohol Use: No  . Drug Use: No  . Sexual Activity: Not Currently   Other Topics Concern  . Not on file   Social History Narrative  . No narrative on file     Review of Systems, as per HPI, otherwise negative General:  No chills, fever, night sweats or weight changes.  Cardiovascular:  No chest pain, dyspnea on exertion, edema, orthopnea, palpitations, paroxysmal nocturnal dyspnea. Dermatological: No rash, lesions/masses Respiratory: No cough, dyspnea Urologic: No hematuria, dysuria Abdominal:   No nausea,  vomiting, diarrhea, bright red blood per rectum, melena, or hematemesis Neurologic:  No visual changes, wkns, changes in mental status. All other systems reviewed and are otherwise negative except as noted above.  Physical Exam  Blood pressure 124/76, pulse 48, height 5\' 8"  (1.727 m), weight 201 lb (91.173 kg).  General: Pleasant, NAD Psych: Normal affect. Neuro: Alert and oriented X 3. Moves all extremities spontaneously. HEENT: Normal  Neck: Supple without bruits or JVD. Lungs:  Resp regular and unlabored, CTA. Heart: RRR no s3, s4, or murmurs. Abdomen: Soft, non-tender, non-distended, BS + x 4.  Extremities: No clubbing, cyanosis or edema. DP/PT/Radials 2+ and equal bilaterally.  Labs:  No  results found for this basename: CKTOTAL, CKMB, TROPONINI,  in the last 72 hours Lab Results  Component Value Date   WBC 27.9* 09/05/2013   HGB 14.1 09/08/2013   HCT 43.1 09/08/2013   MCV 94.4 09/05/2013   PLT 248 09/05/2013    No results found for this basename: DDIMER   No components found with this basename: POCBNP,     Component Value Date/Time   NA 138 10/16/2013 0855   K 3.8 10/16/2013 0855   CL 106 10/16/2013 0855   CO2 23 10/16/2013 0855   GLUCOSE 178* 10/16/2013 0855   BUN 19 10/16/2013 0855   CREATININE 2.1* 10/16/2013 0855   CALCIUM 9.2 10/16/2013 0855   PROT 6.8 10/11/2013 0950   ALBUMIN 3.8 10/11/2013 0950   AST 22 10/11/2013 0950   ALT 49 10/11/2013 0950   ALKPHOS 91 10/11/2013 0950   BILITOT 0.5 10/11/2013 0950   GFRNONAA 29* 09/08/2013 0331   GFRAA 34* 09/08/2013 0331   No results found for this basename: CHOL,  HDL,  LDLCALC,  TRIG    Accessory Clinical Findings  Echocardiogram  - 08/16/2013 Study Conclusions  - Left ventricle: The cavity size was normal. Wall thickness was normal. Systolic function was normal. Wall motion was normal; there were no regional wall motion abnormalities. - Aortic valve: There was mild regurgitation. - Mitral valve: Valve area by pressure  half-time: 1.4 cm^2. - Left atrium: The atrium was moderately dilated. No evidence of thrombus in the atrial cavity or appendage. No evidence of thrombus in the atrial cavity or appendage. - Right atrium: No evidence of thrombus in the atrial cavity or appendage.  Impressions:  - Rheumatic mitral valve with moderate leaflet tip thickening and minimal calcifications. Moderate mitral stenosis and trace mitral egurgitation.  ECG - sinus bradycardia, 43 beats per minute, left axis deviation and right bundle branch block.    Assessment/Plan:  58 year old gentleman with moderate mitral stenosis and paroxysmal atrial fibrillation with RVR leading to frequent CHF. The patient underwent total right nephrectomy for renal cell cancer with ill-defined borders and is recovering well.  1. Mitral stenosis - currently no CHF symptoms, and no palpitations.  However profound fatigue with exertion. We were waiting for him to recover from surgery. We will discontinue Bystolic as he is fatigued and bradycardic.  If his symptoms continue he will be scheduled for mitral valve repair/replacement and maze procedure via minithoracotomy.  2. Paroxysmal atrial fibrillation - currently in sinus bradycardia, we'll continue amiodarone and Coumadin.  3. Hypothyroidism - with upper normal TSH, we'll increase his Synthroid to 75 mcg daily. We will recheck TSH in 3 months.  4. Depression and anxiety - onn SSRI Zoloft 10 mg daily. We have to be cautious with the higher dose considering he is on amiodarone and might have prolonged QT. Contnue Xanax PRN.  Normal QT/QTc today.   5. Lipids - elevated, significant musle pain with fall, check LFTs and CPK today.  Fish oil for now, potentially CPSK9 inhibitors if labs normal.  6. Elevated creatinine - post nephrectomy, recheck today.    7. Profound sinus bradycardia - discontinue Bystolic  Followup in 2 weeks.  Dorothy Spark, MD, [facility] 09/21/2013, 4:03 PM

## 2013-12-22 NOTE — Patient Instructions (Addendum)
Your physician has recommended you make the following change in your medication:   [place]    Your physician recommends that you return for lab work in: TODAY (CMET, CPK)   Your physician recommends that you schedule a follow-up appointment in: [place] 2 [place]

## 2013-12-27 ENCOUNTER — Ambulatory Visit (INDEPENDENT_AMBULATORY_CARE_PROVIDER_SITE_OTHER): Payer: Managed Care, Other (non HMO) | Admitting: Pharmacist

## 2013-12-27 DIAGNOSIS — I5033 Acute on chronic diastolic (congestive) heart failure: Secondary | ICD-10-CM

## 2013-12-27 DIAGNOSIS — I4891 Unspecified atrial fibrillation: Secondary | ICD-10-CM

## 2013-12-27 DIAGNOSIS — Z5181 Encounter for therapeutic drug level monitoring: Secondary | ICD-10-CM

## 2013-12-27 LAB — POCT INR: INR: 2.1

## 2014-01-01 ENCOUNTER — Ambulatory Visit: Payer: Managed Care, Other (non HMO) | Admitting: Thoracic Surgery (Cardiothoracic Vascular Surgery)

## 2014-01-01 ENCOUNTER — Ambulatory Visit (INDEPENDENT_AMBULATORY_CARE_PROVIDER_SITE_OTHER): Payer: Managed Care, Other (non HMO) | Admitting: Thoracic Surgery (Cardiothoracic Vascular Surgery)

## 2014-01-01 ENCOUNTER — Encounter: Payer: Self-pay | Admitting: Thoracic Surgery (Cardiothoracic Vascular Surgery)

## 2014-01-01 VITALS — BP 155/95 | HR 64 | Ht 68.0 in | Wt 201.0 lb

## 2014-01-01 DIAGNOSIS — I099 Rheumatic heart disease, unspecified: Secondary | ICD-10-CM

## 2014-01-01 DIAGNOSIS — I10 Essential (primary) hypertension: Secondary | ICD-10-CM

## 2014-01-01 DIAGNOSIS — I050 Rheumatic mitral stenosis: Secondary | ICD-10-CM

## 2014-01-01 DIAGNOSIS — I480 Paroxysmal atrial fibrillation: Secondary | ICD-10-CM

## 2014-01-01 NOTE — Patient Instructions (Addendum)
Increase your dose of Norvasc (amlodipine) to 20 mg/day until you see Dr [name] in the office

## 2014-01-01 NOTE — Progress Notes (Signed)
[address]       [place] [address]             [phone]     CARDIOTHORACIC SURGERY OFFICE NOTE  Referring Provider is Dorothy Spark, MD PCP is Gilford Rile, MD   HPI:  Patient returns for followup of rheumatic mitral valve disease with stage D severe symptomatic mitral stenosis associated with chronic diastolic congestive heart failure and recurrent paroxysmal atrial fibrillation.  The patient was originally seen in consultation on 08/11/2013 at which time he was hospitalized with an acute exacerbation of chronic diastolic congestive heart failure associated with recurrent paroxysmal atrial fibrillation. He subsequently converted back to sinus rhythm on amiodarone. He was seen in followup on 08/24/2013 and plans were made to proceed with minimally invasive mitral valve repair or replacement and Maze procedure later that month. However, CT angiogram performed 08/25/2013 revealed a mass in the right kidney suspicious for renal cell carcinoma.  Because the patient remained in sinus rhythm and very stable from a cardiac standpoint, plans for cardiac surgery were postponed.  The patient subsequently underwent robotic-assisted right radical nephrectomy on 09/05/2013 by Dr. Rolan Bucco.  Final pathology was consistent with clear cell renal cell carcinoma, Fuhrman grade 2-3, with maximum tumor diameter 2.7 cm, all tumor to find confined to the kidney, and all surgical margins negative ([id number]).  His postoperative recovery was uncomplicated, and his serum creatinine level has stabilized at 2.0. Postoperatively the patient did not experience any problems with recurrent paroxysmal atrial fibrillation or congestive heart failure, and he has been followed in the office on several occasions by Dr. [name].  At the time of his last office visit on 12/22/2013 the patient was Complaining of profound fatigue and exertional shortness of breath. However, he was not experiencing any  palpitations, chest pain, orthopnea, or lower extremity edema. He was notably maintaining sinus rhythm but severely bradycardic with heartrate in the 40's.  His beta blocker was stopped. The patient returns to our office for further followup today. He states that over the past week he is feeling much better with improved energy. He does still experience exertional shortness of breath and he feels as though his exercise tolerance remains poor. He has not had any palpitations. He has not had any orthopnea or lower extremity edema. He notes that his blood pressure has been running quite high since he stopped taking Bystolic.  Current Outpatient Prescriptions  Medication Sig Dispense Refill  . amiodarone (PACERONE) 200 MG tablet Take 1 tablet (200 mg total) by mouth every morning.  30 tablet  6  . amLODipine (NORVASC) 10 MG tablet Take 1 tablet (10 mg total) by mouth daily.  180 tablet  3  . levothyroxine (SYNTHROID) 75 MCG tablet Take 1 tablet (75 mcg total) by mouth daily before breakfast.  90 tablet  3  . sertraline (ZOLOFT) 50 MG tablet Take 1 tablet (50 mg total) by mouth daily.  90 tablet  4  . warfarin (COUMADIN) 5 MG tablet Take 5 mg by mouth daily.      Marland Kitchen ALPRAZolam (XANAX) 0.5 MG tablet Take 0.25-0.5 mg by mouth at bedtime as needed for anxiety.       No current facility-administered medications for this visit.      Physical Exam:   BP 155/95  Pulse 64  Ht 5\' 8"  (1.727 m)  Wt 201 lb (91.173 kg)  BMI 30.57 kg/m2  SpO2 94%  General:  Well-appearing  Chest:  Clear  CV:   Regular rate and rhythm without murmur  Incisions:  n/a  Abdomen:  Soft and nontender  Extremities:  Warm and well-perfused, no lower extremity edema  Diagnostic Tests:  n/a   Impression:  The patient has long-standing rheumatic mitral stenosis with chronic diastolic congestive heart failure and recurrent paroxysmal atrial fibrillation.  However, he has remained clinically stable from a cardiac standpoint  since he underwent radical nephrectomy last June and he continues to maintain sinus rhythm on amiodarone.  He reports feeling much better since his beta blocker was stopped last week, although his blood pressure is running fairly high. His renal function appears to have stabilized post nephrectomy with recent serum creatinine measured 2.0.     Plan:  At this point I would favor waiting a few months to allow him to recover more completely before proceeding with mitral valve replacement and Maze procedure.  I have instructed the patient to double his dose of Norvasc and continue to monitor his blood pressure carefully.  He will continue to followup with Dr. [name] for further adjustment in his medications and we will plan to see him back in 3 months.   I spent in excess of 15 minutes during the conduct of this office consultation and >50% of this time involved direct face-to-face encounter with the patient for counseling and/or coordination of their care.   Valentina Gu. Roxy Manns, MD 01/01/2014 2:22 PM

## 2014-01-02 NOTE — Telephone Encounter (Signed)
error 

## 2014-01-15 ENCOUNTER — Other Ambulatory Visit (INDEPENDENT_AMBULATORY_CARE_PROVIDER_SITE_OTHER): Payer: Managed Care, Other (non HMO) | Admitting: *Deleted

## 2014-01-15 ENCOUNTER — Other Ambulatory Visit: Payer: Self-pay | Admitting: *Deleted

## 2014-01-15 DIAGNOSIS — I2510 Atherosclerotic heart disease of native coronary artery without angina pectoris: Secondary | ICD-10-CM

## 2014-01-15 DIAGNOSIS — R5383 Other fatigue: Secondary | ICD-10-CM

## 2014-01-15 DIAGNOSIS — R5381 Other malaise: Secondary | ICD-10-CM

## 2014-01-15 LAB — CBC WITH DIFFERENTIAL/PLATELET
Basophils Absolute: 0.1 10*3/uL (ref 0.0–0.1)
Basophils Relative: 0.6 % (ref 0.0–3.0)
Eosinophils Absolute: 0.2 10*3/uL (ref 0.0–0.7)
Eosinophils Relative: 1.6 % (ref 0.0–5.0)
HCT: 50 % (ref 39.0–52.0)
Hemoglobin: 16.6 g/dL (ref 13.0–17.0)
Lymphocytes Relative: 24.8 % (ref 12.0–46.0)
Lymphs Abs: 3.4 10*3/uL (ref 0.7–4.0)
MCHC: 33.1 g/dL (ref 30.0–36.0)
MCV: 94.2 fl (ref 78.0–100.0)
Monocytes Absolute: 1.1 10*3/uL — ABNORMAL HIGH (ref 0.1–1.0)
Monocytes Relative: 8.2 % (ref 3.0–12.0)
Neutro Abs: 8.9 10*3/uL — ABNORMAL HIGH (ref 1.4–7.7)
Neutrophils Relative %: 64.8 % (ref 43.0–77.0)
Platelets: 265 10*3/uL (ref 150.0–400.0)
RBC: 5.31 Mil/uL (ref 4.22–5.81)
RDW: 15.8 % — ABNORMAL HIGH (ref 11.5–15.5)
WBC: 13.7 10*3/uL — ABNORMAL HIGH (ref 4.0–10.5)

## 2014-01-15 LAB — COMPREHENSIVE METABOLIC PANEL
ALT: 49 U/L (ref 0–53)
AST: 27 U/L (ref 0–37)
Albumin: 3.6 g/dL (ref 3.5–5.2)
Alkaline Phosphatase: 110 U/L (ref 39–117)
BUN: 19 mg/dL (ref 6–23)
CO2: 25 mEq/L (ref 19–32)
Calcium: 10 mg/dL (ref 8.4–10.5)
Chloride: 104 mEq/L (ref 96–112)
Creatinine, Ser: 2 mg/dL — ABNORMAL HIGH (ref 0.4–1.5)
GFR: 37.4 mL/min — ABNORMAL LOW (ref >60.00)
Glucose, Bld: 130 mg/dL — ABNORMAL HIGH (ref 70–99)
Potassium: 4 mEq/L (ref 3.5–5.1)
Sodium: 137 mEq/L (ref 135–145)
Total Bilirubin: 0.7 mg/dL (ref 0.2–1.2)
Total Protein: 7.9 g/dL (ref 6.0–8.3)

## 2014-01-15 LAB — LIPID PANEL
Cholesterol: 280 mg/dL — ABNORMAL HIGH (ref 0–200)
HDL: 23.2 mg/dL — ABNORMAL LOW (ref >39.00)
NonHDL: 256.8
Total CHOL/HDL Ratio: 12
Triglycerides: 572 mg/dL — ABNORMAL HIGH (ref 0.0–149.0)
VLDL: 114.4 mg/dL — ABNORMAL HIGH (ref 0.0–40.0)

## 2014-01-15 LAB — TSH: TSH: 0.63 u[IU]/mL (ref 0.35–4.50)

## 2014-01-15 LAB — LDL CHOLESTEROL, DIRECT: Direct LDL: 148.2 mg/dL

## 2014-01-16 ENCOUNTER — Ambulatory Visit (INDEPENDENT_AMBULATORY_CARE_PROVIDER_SITE_OTHER): Payer: Managed Care, Other (non HMO) | Admitting: Cardiology

## 2014-01-16 ENCOUNTER — Encounter: Payer: Self-pay | Admitting: Cardiology

## 2014-01-16 VITALS — BP 158/94 | HR 71 | Ht 68.0 in | Wt 199.8 lb

## 2014-01-16 DIAGNOSIS — E785 Hyperlipidemia, unspecified: Secondary | ICD-10-CM

## 2014-01-16 DIAGNOSIS — I4891 Unspecified atrial fibrillation: Secondary | ICD-10-CM

## 2014-01-16 DIAGNOSIS — I480 Paroxysmal atrial fibrillation: Secondary | ICD-10-CM

## 2014-01-16 DIAGNOSIS — I050 Rheumatic mitral stenosis: Secondary | ICD-10-CM

## 2014-01-16 DIAGNOSIS — R0609 Other forms of dyspnea: Secondary | ICD-10-CM

## 2014-01-16 DIAGNOSIS — I10 Essential (primary) hypertension: Secondary | ICD-10-CM

## 2014-01-16 DIAGNOSIS — E038 Other specified hypothyroidism: Secondary | ICD-10-CM

## 2014-01-16 MED ORDER — AMLODIPINE BESYLATE 10 MG PO TABS: 10.0000 mg | ORAL_TABLET | Freq: Two times a day (BID) | ORAL | Status: DC

## 2014-01-16 MED ORDER — FENOFIBRATE 160 MG PO TABS: 160.0000 mg | ORAL_TABLET | Freq: Every day | ORAL | Status: DC

## 2014-01-16 NOTE — Progress Notes (Signed)
Patient ID: SHIHEEM CORPORAN, male   DOB: Jan 01, 1956, 58 y.o.   MRN: [id number]    Patient Name: Kevin English Date of Encounter: 01/16/2014  Primary Care Provider:  Gilford Rile, MD Primary Cardiologist:  Dorothy Spark  Problem List   Past Medical History  Diagnosis Date  . Hernia   . Chronic rhinitis   . Diverticulitis of colon 15 years ago  . Hyperlipidemia   . Hypogonadism male   . Leukocytosis   . Myalgia and myositis, unspecified   . Obesity   . OSA on CPAP   . Hypothyroidism   . PAF (paroxysmal atrial fibrillation)     a. sustained 160-180 on e-CARDIO monitor placed 07/29/2013. Placed on IV amiodarone at end of May 2015 due to continued paroxysms with RVR.  Marland Kitchen Rheumatic heart disease   . Chronic diastolic CHF (congestive heart failure)     a. Occurs in context of AF-RVR with mitral stenosis.  . Mitral stenosis     a. Dx 07/2013 - through workup of 2D echo, TEE, and cath, felt to be moderate.  . Paroxysmal atrial flutter   . CKD (chronic kidney disease), stage II   . CAD (coronary artery disease)     a. By cath 08/2013: 70% distal cx-prox LPDA; tandem mod LAD lesions, o/w mild dz.  . Renal mass     a. Dx 08/2013: concerning for renal cancer.  Marland Kitchen HTN (hypertension)     hx of- under control  . Stroke 1998    "MRI showed 3 mild strokes"  . Borderline diabetes   . Depression   . Anxiety   . Dizzy spells   . Deafness in left ear   . Mitral stenosis 08/02/2013   Past Surgical History  Procedure Laterality Date  . Tee without cardioversion N/A 08/16/2013    Procedure: TRANSESOPHAGEAL ECHOCARDIOGRAM (TEE);  Surgeon: Dorothy Spark, MD;  Location: [facility];  Service: Cardiovascular;  Laterality: N/A;  . Inguinal hernia repair Bilateral first one in 80's    "I had one side done twice"  . Cardiac catheterization  08/28/13  . Eye surgery Right 1990    "reconstructive, lens implant- from paintball accident"  . Robot assisted laparoscopic nephrectomy Right 09/05/2013   Procedure: ROBOTIC ASSISTED LAPAROSCOPIC RIGHT RADICAL NEPHRECTOMY;  Surgeon: Sharyn Creamer, MD;  Location: [facility];  Service: Urology;  Laterality: Right;    Allergies  Allergies  Allergen Reactions  . Atorvastatin Other (See Comments)    Myalgias  . Pravastatin Other (See Comments)    Myalgias     HPI  58 years old male with h/o HTN, hyperlipidemia, former smoker, who was diagnosed with moderate mitral stenosis leading into frequent atrial fibrillations and CHF. The patient was evaluated for mitral valve repair or replacement with maze procedure of the mini thoracotomy by Dr. Roxy Manns. Meanwhile he was started on amiodarone that keeps him in sinus rhythm. In the preoperative workup the patient was diagnosed with renal cell carcinoma and just underwent a robotic total nephrectomy. This is his first postoperative visit. Patient denies any chest pain palpitation or syncope. He doesn't think he had an episode of atrial fibrillation. He denies any significant shortness of breath, lower extremity edema orthopnea or personal nocturnal dyspnea. The patient has been depressed for a while and now feels even more down and is inquiring about antidepressant medication. He was seen by he is primary care physician and his TSH has been upper normal and so the question is if he  Synthroid needs to be elevated considering he is using amiodarone.  Follow up after 1 month - 10/16/13 - he feels significantly better, no SOB or CP, he started to walk with his wife. Muscle pain resolved with increased hydration. Normal labs the last week - LFTs and CPK. Improved mood and depression. Normal QT/QTc after 2 weeks of Zoloft.   12/22/2013 - he doesn't feel well, profound fatigue. Feeling down. If he walks he gets really SOB. NO symptoms of CHF such as LE edema, orthopnea, PND. No angina. No palpitations.   01/17/2014 - the patient saw Dr Roxy Manns who wants to wait few more months to recover from nephrectomy before he proceeds  with mitral valve surgery and maze procedure. Stopping of beta blocker gave patient some relief is decreased but he still feels and looks significantly depressed. He is currently taking Zoloft and we have been watching QTc interval. His amlodipine was increased to twice daily and his blood pressure is controlled also to home. His most recent labs showed significantly elevated triglycerides. He stopped taking Lipitor as he had significant muscle pain. He complains of lack of joy in life, inability to enjoy his grandkids and just feeling down all the time.   Home Medications  Prior to Admission medications   Medication Sig Start Date End Date Taking? Authorizing Provider  Probiotic Product (PROBIOTIC DAILY PO) Take by mouth.   Yes Historical Provider, MD  warfarin (COUMADIN) 5 MG tablet Take 5 mg by mouth daily.   Yes Historical Provider, MD  ALPRAZolam (XANAX) 0.25 MG tablet Take 1 tablet (0.25 mg total) by mouth 2 (two) times daily as needed for anxiety. 09/21/13   Dorothy Spark, MD  amiodarone (PACERONE) 400 MG tablet Take 400 mg by mouth every morning.    Historical Provider, MD  atorvastatin (LIPITOR) 10 MG tablet Take 1 tablet (10 mg total) by mouth daily. 09/21/13   Dorothy Spark, MD  citalopram (CELEXA) 10 MG tablet Take 1 tablet (10 mg total) by mouth daily. 09/21/13   Dorothy Spark, MD  diltiazem (CARDIZEM CD) 120 MG 24 hr capsule Take 120 mg by mouth every morning.    Historical Provider, MD  levothyroxine (SYNTHROID) 75 MCG tablet Take 1 tablet (75 mcg total) by mouth daily before breakfast. 09/21/13   Dorothy Spark, MD  nebivolol (BYSTOLIC) 2.5 MG tablet Take 1 tablet (2.5 mg total) by mouth daily. 09/18/13   Dorothy Spark, MD    Family History  Family History  Problem Relation Age of Onset  . Hypertension    . Hypertension Mother     Social History  History   Social History  . Marital Status: Married    Spouse Name: N/A    Number of Children: N/A  . Years of  Education: N/A   Occupational History  . Not on file.   Social History Main Topics  . Smoking status: Current Every Day Smoker -- 0.50 packs/day for 40 years    Types: Cigarettes  . Smokeless tobacco: Never Used  . Alcohol Use: No  . Drug Use: No  . Sexual Activity: Not Currently   Other Topics Concern  . Not on file   Social History Narrative  . No narrative on file     Review of Systems, as per HPI, otherwise negative General:  No chills, fever, night sweats or weight changes.  Cardiovascular:  No chest pain, dyspnea on exertion, edema, orthopnea, palpitations, paroxysmal nocturnal dyspnea. Dermatological: No rash, lesions/masses Respiratory:  No cough, dyspnea Urologic: No hematuria, dysuria Abdominal:   No nausea, vomiting, diarrhea, bright red blood per rectum, melena, or hematemesis Neurologic:  No visual changes, wkns, changes in mental status. All other systems reviewed and are otherwise negative except as noted above.  Physical Exam  Blood pressure 158/94, pulse 71, height 5\' 8"  (1.727 m), weight 199 lb 12.8 oz (90.629 kg). Rechecked BP 138/76. General: Pleasant, NAD Psych: Normal affect. Neuro: Alert and oriented X 3. Moves all extremities spontaneously. HEENT: Normal  Neck: Supple without bruits or JVD. Lungs:  Resp regular and unlabored, CTA. Heart: RRR no s3, s4, or murmurs. Abdomen: Soft, non-tender, non-distended, BS + x 4.  Extremities: No clubbing, cyanosis or edema. DP/PT/Radials 2+ and equal bilaterally.  Labs:  No results found for this basename: CKTOTAL, CKMB, TROPONINI,  in the last 72 hours Lab Results  Component Value Date   WBC 13.7* 01/15/2014   HGB 16.6 01/15/2014   HCT 50.0 01/15/2014   MCV 94.2 01/15/2014   PLT 265.0 01/15/2014    No results found for this basename: DDIMER   No components found with this basename: POCBNP,     Component Value Date/Time   NA 137 01/15/2014 0801   K 4.0 01/15/2014 0801   CL 104 01/15/2014 0801    CO2 25 01/15/2014 0801   GLUCOSE 130* 01/15/2014 0801   BUN 19 01/15/2014 0801   CREATININE 2.0* 01/15/2014 0801   CALCIUM 10.0 01/15/2014 0801   PROT 7.9 01/15/2014 0801   ALBUMIN 3.6 01/15/2014 0801   AST 27 01/15/2014 0801   ALT 49 01/15/2014 0801   ALKPHOS 110 01/15/2014 0801   BILITOT 0.7 01/15/2014 0801   GFRNONAA 29* 09/08/2013 0331   GFRAA 34* 09/08/2013 0331   Lab Results  Component Value Date   CHOL 280* 01/15/2014    Accessory Clinical Findings  Echocardiogram  - 08/16/2013 Study Conclusions  - Left ventricle: The cavity size was normal. Wall thickness was normal. Systolic function was normal. Wall motion was normal; there were no regional wall motion abnormalities. - Aortic valve: There was mild regurgitation. - Mitral valve: Valve area by pressure half-time: 1.4 cm^2. - Left atrium: The atrium was moderately dilated. No evidence of thrombus in the atrial cavity or appendage. No evidence of thrombus in the atrial cavity or appendage. - Right atrium: No evidence of thrombus in the atrial cavity or appendage.  Impressions:  - Rheumatic mitral valve with moderate leaflet tip thickening and minimal calcifications. Moderate mitral stenosis and trace mitral egurgitation.  ECG - sinus bradycardia, 43 beats per minute, left axis deviation and right bundle branch block.    Assessment/Plan:  58 year old gentleman with moderate mitral stenosis and paroxysmal atrial fibrillation with RVR leading to frequent CHF. The patient underwent total right nephrectomy for renal cell cancer with ill-defined borders and is recovering well.  1. Mitral stenosis - currently no CHF symptoms, and no palpitations.  However profound fatigue with exertion. We were waiting for him to recover from surgery. We will discontinue Bystolic as he is fatigued and bradycardic.  We will refer again to Dr Roxy Manns mitral valve repair/replacement and maze procedure via minithoracotomy.  2. Paroxysmal  atrial fibrillation - currently in sinus bradycardia, we'll continue amiodarone and Coumadin.  3. Hypothyroidism - with upper normal TSH, we'll increase his Synthroid to 75 mcg daily. We will recheck TSH in 3 months.   4. Depression and anxiety - onn SSRI Zoloft 10 mg daily. We have to be cautious with the  higher dose considering he is on amiodarone and might have prolonged QT. Contnue Xanax PRN. We will refer him to a psychologist. Normal QT/QTc today.   5. Lipids - elevated, significant musle pain with fall, mildly elevated LFTs and significant muscle pain stopped lipitor, we will start fenofibrate 160 mg po daily, recheck CMP and lipids in 3 months. Fish oil in addition.  6. Elevated creatinine - post nephrectomy, slowly improving, last 2.0.    7. Profound sinus bradycardia - resolved after stopping Bystolic  Followup in 3 months.  Dorothy Spark, MD, [facility] 09/21/2013, 4:03 PM

## 2014-01-16 NOTE — Patient Instructions (Signed)
Your physician has recommended you make the following change in your medication:   START TAKING FENOFIBRATE 160 MG DAILY     Your physician recommends that you return for lab work in: [place] - TSH, CMP, LIPIDS    You have been referred to PSYCHOLOGY     Your physician recommends that you schedule a follow-up appointment in: 3 MONTHS WITH DR [name]- YOU WILL HAVE LABS DRAWN THEN TOO

## 2014-01-16 NOTE — Addendum Note (Signed)
Addended by: Nuala Alpha on: 01/16/2014 09:14 AM   Modules accepted: Orders

## 2014-02-01 ENCOUNTER — Other Ambulatory Visit: Payer: Self-pay

## 2014-02-01 MED ORDER — AMIODARONE HCL 200 MG PO TABS: 200.0000 mg | ORAL_TABLET | Freq: Every morning | ORAL | Status: DC

## 2014-02-07 ENCOUNTER — Ambulatory Visit (INDEPENDENT_AMBULATORY_CARE_PROVIDER_SITE_OTHER): Payer: Managed Care, Other (non HMO) | Admitting: *Deleted

## 2014-02-07 DIAGNOSIS — I4891 Unspecified atrial fibrillation: Secondary | ICD-10-CM

## 2014-02-07 DIAGNOSIS — I5033 Acute on chronic diastolic (congestive) heart failure: Secondary | ICD-10-CM

## 2014-02-07 DIAGNOSIS — Z5181 Encounter for therapeutic drug level monitoring: Secondary | ICD-10-CM

## 2014-02-07 LAB — POCT INR: INR: 1.8

## 2014-03-01 ENCOUNTER — Encounter (HOSPITAL_COMMUNITY): Payer: Self-pay | Admitting: Cardiology

## 2014-03-07 ENCOUNTER — Ambulatory Visit (INDEPENDENT_AMBULATORY_CARE_PROVIDER_SITE_OTHER): Payer: Managed Care, Other (non HMO) | Admitting: *Deleted

## 2014-03-07 DIAGNOSIS — I5033 Acute on chronic diastolic (congestive) heart failure: Secondary | ICD-10-CM

## 2014-03-07 DIAGNOSIS — Z5181 Encounter for therapeutic drug level monitoring: Secondary | ICD-10-CM

## 2014-03-07 DIAGNOSIS — I4891 Unspecified atrial fibrillation: Secondary | ICD-10-CM

## 2014-03-07 LAB — POCT INR: INR: 1.6

## 2014-03-13 ENCOUNTER — Ambulatory Visit (HOSPITAL_COMMUNITY)
Admission: RE | Admit: 2014-03-13 | Discharge: 2014-03-13 | Disposition: A | Discharge status: Home / Self Care | Payer: Managed Care, Other (non HMO) | Source: Ambulatory Visit | Attending: Urology | Admitting: Urology

## 2014-03-13 ENCOUNTER — Other Ambulatory Visit (HOSPITAL_COMMUNITY): Payer: Self-pay | Admitting: Urology

## 2014-03-13 DIAGNOSIS — Z85528 Personal history of other malignant neoplasm of kidney: Secondary | ICD-10-CM | POA: Insufficient documentation

## 2014-03-13 DIAGNOSIS — I10 Essential (primary) hypertension: Secondary | ICD-10-CM | POA: Insufficient documentation

## 2014-03-13 DIAGNOSIS — F17210 Nicotine dependence, cigarettes, uncomplicated: Secondary | ICD-10-CM | POA: Diagnosis not present

## 2014-03-13 DIAGNOSIS — R0602 Shortness of breath: Secondary | ICD-10-CM | POA: Insufficient documentation

## 2014-03-13 DIAGNOSIS — C641 Malignant neoplasm of right kidney, except renal pelvis: Secondary | ICD-10-CM

## 2014-03-20 ENCOUNTER — Other Ambulatory Visit: Payer: Self-pay | Admitting: Cardiology

## 2014-03-20 ENCOUNTER — Ambulatory Visit (INDEPENDENT_AMBULATORY_CARE_PROVIDER_SITE_OTHER): Payer: Managed Care, Other (non HMO) | Admitting: *Deleted

## 2014-03-20 ENCOUNTER — Other Ambulatory Visit (HOSPITAL_COMMUNITY): Payer: Self-pay | Admitting: Urology

## 2014-03-20 ENCOUNTER — Telehealth: Payer: Self-pay

## 2014-03-20 ENCOUNTER — Telehealth: Payer: Self-pay | Admitting: *Deleted

## 2014-03-20 DIAGNOSIS — C649 Malignant neoplasm of unspecified kidney, except renal pelvis: Secondary | ICD-10-CM

## 2014-03-20 DIAGNOSIS — I4891 Unspecified atrial fibrillation: Secondary | ICD-10-CM

## 2014-03-20 DIAGNOSIS — E785 Hyperlipidemia, unspecified: Secondary | ICD-10-CM

## 2014-03-20 DIAGNOSIS — I10 Essential (primary) hypertension: Secondary | ICD-10-CM

## 2014-03-20 DIAGNOSIS — Z5181 Encounter for therapeutic drug level monitoring: Secondary | ICD-10-CM

## 2014-03-20 DIAGNOSIS — E038 Other specified hypothyroidism: Secondary | ICD-10-CM

## 2014-03-20 DIAGNOSIS — I5033 Acute on chronic diastolic (congestive) heart failure: Secondary | ICD-10-CM

## 2014-03-20 LAB — POCT INR: INR: 1.6

## 2014-03-20 MED ORDER — SERTRALINE HCL 50 MG PO TABS: 50.0000 mg | ORAL_TABLET | Freq: Every day | ORAL | Status: DC

## 2014-03-20 NOTE — Telephone Encounter (Signed)
Medication Detail      Disp Refills Start End     levothyroxine (SYNTHROID) 75 MCG tablet 90 tablet 3 09/21/2013     Sig - Route: Take 1 tablet (75 mcg total) by mouth daily before breakfast. - Oral    E-Prescribing Status: Receipt confirmed by pharmacy (09/21/2013 12:26 PM EDT)     Associated Diagnoses    [facility] called to check refill status of synthroid and the pharmacist said it was not there. So authorized 90 tablets with 1 refill based off previous office visit and refill on 09/21/2013

## 2014-03-20 NOTE — Telephone Encounter (Signed)
Spoke with the pts wife in regards to refills on pts Synthroid 75 mcg po daily, ordered and maintained by Dr [name].  Informed the wife that pt currently has 3 refills of this med at [facility].  Informed the wife if any complications occur with refilling this med, to contact the office to reorder.  Wife verbalized understanding and agrees with this plan.

## 2014-03-29 ENCOUNTER — Encounter (HOSPITAL_COMMUNITY)
Admission: RE | Admit: 2014-03-29 | Discharge: 2014-03-29 | Disposition: A | Discharge status: Home / Self Care | Payer: Managed Care, Other (non HMO) | Source: Ambulatory Visit | Attending: Urology | Admitting: Urology

## 2014-03-29 DIAGNOSIS — C649 Malignant neoplasm of unspecified kidney, except renal pelvis: Secondary | ICD-10-CM | POA: Insufficient documentation

## 2014-03-29 MED ORDER — TECHNETIUM TC 99M MEDRONATE IV KIT
25.0000 | PACK | Freq: Once | INTRAVENOUS | Status: AC | PRN
  Administered 2014-03-29: 25 via INTRAVENOUS

## 2014-04-02 ENCOUNTER — Ambulatory Visit (INDEPENDENT_AMBULATORY_CARE_PROVIDER_SITE_OTHER): Payer: Managed Care, Other (non HMO) | Admitting: Thoracic Surgery (Cardiothoracic Vascular Surgery)

## 2014-04-02 ENCOUNTER — Encounter: Payer: Self-pay | Admitting: Thoracic Surgery (Cardiothoracic Vascular Surgery)

## 2014-04-02 ENCOUNTER — Other Ambulatory Visit: Payer: Self-pay | Admitting: *Deleted

## 2014-04-02 ENCOUNTER — Ambulatory Visit (INDEPENDENT_AMBULATORY_CARE_PROVIDER_SITE_OTHER): Payer: Managed Care, Other (non HMO) | Admitting: Pharmacist

## 2014-04-02 VITALS — BP 148/65 | HR 65 | Resp 16 | Ht 68.0 in | Wt 202.0 lb

## 2014-04-02 DIAGNOSIS — I4891 Unspecified atrial fibrillation: Secondary | ICD-10-CM

## 2014-04-02 DIAGNOSIS — I050 Rheumatic mitral stenosis: Secondary | ICD-10-CM

## 2014-04-02 DIAGNOSIS — I5033 Acute on chronic diastolic (congestive) heart failure: Secondary | ICD-10-CM

## 2014-04-02 DIAGNOSIS — Z5181 Encounter for therapeutic drug level monitoring: Secondary | ICD-10-CM

## 2014-04-02 DIAGNOSIS — I7100 Dissection of unspecified site of aorta: Secondary | ICD-10-CM

## 2014-04-02 LAB — POCT INR: INR: 2.1

## 2014-04-02 NOTE — Patient Instructions (Signed)
Patient has been instructed to stop taking fish oil now and stop taking coumadin (warfarin) after you take your dose on Friday 1/22  Patient should continue taking all other medications without change through the day before surgery.  Patient should have nothing to eat or drink after midnight the night before surgery.  On the morning of surgery patient should take only levothyroxine (SYNTHROID) and amlodipine (NORVASC) with a sip of water.

## 2014-04-02 NOTE — Progress Notes (Signed)
[address]       [place] [address]             [phone]     CARDIOTHORACIC SURGERY OFFICE NOTE  Referring Provider is Dorothy Spark, MD PCP is Gilford Rile, MD   HPI:  Patient returns for followup of rheumatic mitral valve disease with stage D severe symptomatic mitral stenosis associated with chronic diastolic congestive heart failure and recurrent paroxysmal atrial fibrillation. The patient was originally seen in consultation on 08/11/2013 at which time he was hospitalized with an acute exacerbation of chronic diastolic congestive heart failure associated with recurrent paroxysmal atrial fibrillation. He subsequently converted back to sinus rhythm on amiodarone. He was seen in followup on 08/24/2013 and plans were made to proceed with minimally invasive mitral valve repair or replacement and Maze procedure later that month. However, CT angiogram performed 08/25/2013 revealed a mass in the right kidney suspicious for renal cell carcinoma. Because the patient remained in sinus rhythm and very stable from a cardiac standpoint, plans for cardiac surgery were postponed. The patient subsequently underwent robotic-assisted right radical nephrectomy on 09/05/2013 by Dr. Rolan Bucco. Final pathology was consistent with clear cell renal cell carcinoma, Fuhrman grade 2-3, with maximum tumor diameter 2.7 cm, all tumor to find confined to the kidney, and all surgical margins negative (T1aN0). His postoperative recovery was uncomplicated, and his serum creatinine level has stabilized at 2.0. Postoperatively the patient did not experience any problems with recurrent paroxysmal atrial fibrillation or congestive heart failure, and he has been followed in the office on several occasions by Dr. [name].  He was last seen in follow-up. In our office on 01/01/2014. He now returns with hope to proceed with mitral valve repair or replacement and Maze procedure. He states that over  the last few months he has continued to feel better as time progresses. He still gets short of breath and tired quite easily with any physical exertion. However, his energy level has improved and he feels as though he is finally recovered completely from his nephrectomy.  He has not had any palpitations no other signs to suggest a recurrence of atrial fibrillation. The remainder of his review of systems is unremarkable.   Current Outpatient Prescriptions  Medication Sig Dispense Refill  . acetaminophen (TYLENOL) 325 MG tablet Take 650 mg by mouth every 6 (six) hours as needed (pain).    Marland Kitchen ALPRAZolam (XANAX) 0.5 MG tablet Take 0.5-1 mg by mouth at bedtime as needed for anxiety.     Marland Kitchen amiodarone (PACERONE) 200 MG tablet Take 1 tablet (200 mg total) by mouth every morning. 30 tablet 6  . amLODipine (NORVASC) 10 MG tablet Take 1 tablet (10 mg total) by mouth 2 (two) times daily. 90 tablet 3  . levothyroxine (SYNTHROID) 75 MCG tablet Take 1 tablet (75 mcg total) by mouth daily before breakfast. 90 tablet 3  . Omega-3 Fatty Acids (FISH OIL) 1200 MG CPDR Take 4 capsules by mouth daily.    . pseudoephedrine-acetaminophen (TYLENOL SINUS) 30-500 MG TABS Take 1 tablet by mouth every 4 (four) hours as needed (sinus pressure).    . sertraline (ZOLOFT) 50 MG tablet Take 1 tablet (50 mg total) by mouth daily. 90 tablet 4  . warfarin (COUMADIN) 5 MG tablet Take as directed by the coumadin clinic     No current facility-administered medications for this visit.      Physical Exam:   BP 148/65 mmHg  Pulse 65  Resp  16  Ht 5\' 8"  (1.727 m)  Wt 202 lb (91.627 kg)  BMI 30.72 kg/m2  SpO2 98%  General:  Well-appearing  Chest:   Clear  CV:   Regular rate and rhythm with opening snap  Incisions:  Completely healed  Abdomen:  Soft and nontender  Extremities:  Warm and well perfused with no lower extremity edema  Diagnostic Tests:  n/a   Impression:  The patient has long-standing rheumatic mitral  stenosis with chronic diastolic congestive heart failure and recurrent paroxysmal atrial fibrillation. However, he has remained clinically stable from a cardiac standpoint since he underwent radical nephrectomy last June and he continues to maintain sinus rhythm on amiodarone.  He continues to experience shortness of breath and fatigue with any significant physical exertion.   Plan:  The patient and his wife were again counseled at length regarding the indications, risks and potential benefits of mitral valve replacement.  The rationale for elective surgery has been explained, including a comparison between surgery and continued medical therapy with close follow-up.  The likelihood of successful and durable valve repair (commissuroplasty) has been discussed with particular reference to the findings of his most recent echocardiogram.   We have discussed the option of replacing his valve as a more permanent result, particularly if the valve was replaced using a mechanical prosthesis.  We contrasted the advantages of replacing the mitral valve using a mechanical prosthesis with the attendant need for long-term anticoagulation versus the alternative of replacing it using a bioprosthetic tissue valve with its potential for late structural valve deterioration and failure, depending upon the patient's longevity.  The patient specifically requests that his valve be replaced using a mechanical valve.   The patient understands and accepts all potential risks of surgery including but not limited to risk of death, stroke or other neurologic complication, myocardial infarction, congestive heart failure, respiratory failure, renal failure, bleeding requiring transfusion and/or reexploration, arrhythmia, infection or other wound complications, pneumonia, pleural and/or pericardial effusion, pulmonary embolus, aortic dissection or other major vascular complication, or delayed complications related to valve repair or  replacement including but not limited to structural valve deterioration and failure, thrombosis, embolization, endocarditis, or paravalvular leak.  All of their questions have been answered.  The patient hopes to proceed with surgery as is practical.  We tentatively schedule surgery for 04/19/2014. He will undergo follow-up CT scan of the chest abdomen and pelvis given his recent nephrectomy and history of renal cell cancer. The patient has been instructed to stop taking warfarin on Friday, January 22. He will remain on all other medications without change until surgery.    I spent in excess of 30 minutes during the conduct of this office consultation and >50% of this time involved direct face-to-face encounter with the patient for counseling and/or coordination of their care.   Valentina Gu. Roxy Manns, MD 04/02/2014 7:29 PM

## 2014-04-09 ENCOUNTER — Other Ambulatory Visit: Payer: Self-pay | Admitting: *Deleted

## 2014-04-09 DIAGNOSIS — I480 Paroxysmal atrial fibrillation: Secondary | ICD-10-CM

## 2014-04-09 DIAGNOSIS — E038 Other specified hypothyroidism: Secondary | ICD-10-CM

## 2014-04-09 DIAGNOSIS — I050 Rheumatic mitral stenosis: Secondary | ICD-10-CM

## 2014-04-09 DIAGNOSIS — I7100 Dissection of unspecified site of aorta: Secondary | ICD-10-CM

## 2014-04-09 DIAGNOSIS — I10 Essential (primary) hypertension: Secondary | ICD-10-CM

## 2014-04-09 DIAGNOSIS — E785 Hyperlipidemia, unspecified: Secondary | ICD-10-CM

## 2014-04-09 DIAGNOSIS — R0609 Other forms of dyspnea: Secondary | ICD-10-CM

## 2014-04-09 DIAGNOSIS — I4891 Unspecified atrial fibrillation: Secondary | ICD-10-CM

## 2014-04-09 LAB — BUN: BUN: 17 mg/dL (ref 6–23)

## 2014-04-10 ENCOUNTER — Ambulatory Visit
Admission: RE | Admit: 2014-04-10 | Discharge: 2014-04-10 | Disposition: A | Discharge status: Home / Self Care | Payer: Managed Care, Other (non HMO) | Source: Ambulatory Visit | Attending: Thoracic Surgery (Cardiothoracic Vascular Surgery) | Admitting: Thoracic Surgery (Cardiothoracic Vascular Surgery)

## 2014-04-10 DIAGNOSIS — I7100 Dissection of unspecified site of aorta: Secondary | ICD-10-CM

## 2014-04-10 DIAGNOSIS — R911 Solitary pulmonary nodule: Secondary | ICD-10-CM | POA: Insufficient documentation

## 2014-04-10 HISTORY — DX: Solitary pulmonary nodule: R91.1

## 2014-04-10 LAB — CREATININE, SERUM: CREATININE: 1.85 mg/dL — AB (ref 0.50–1.35)

## 2014-04-10 MED ORDER — IOHEXOL 350 MG/ML SOLN
60.0000 mL | Freq: Once | INTRAVENOUS | Status: AC | PRN
  Administered 2014-04-10: 60 mL via INTRAVENOUS

## 2014-04-11 ENCOUNTER — Encounter: Payer: Self-pay | Admitting: Thoracic Surgery (Cardiothoracic Vascular Surgery)

## 2014-04-11 ENCOUNTER — Telehealth: Payer: Self-pay | Admitting: Cardiology

## 2014-04-11 NOTE — Telephone Encounter (Signed)
New Message  Pt wife calling to talk w/ Rn about upcoming surgery. Pt wife said to use home # first, if not there, can use [phone]. Please call back

## 2014-04-11 NOTE — Telephone Encounter (Signed)
Pts wife Kevin English calling to inform Dr [name] and myself that the pt will have proceed with having his mitral valve replacement done by Dr Darylene Price next Thursday 04/19/14.  Also wife wanted to inform Dr [name] that the pt had a bone scan done a couple of weeks ago to screen for bone cancer, and per the pts wife, the scan came back negative for bone cancer.  Informed the pts wife Kevin English that I will certainly let Dr [name] know this and we will follow up with the pt after his surgery to check in on him.  Informed the wife that a follow-up appt with Dr [name] will be set-up after his surgery, advised by Dr Roxy Manns.  Kevin English verbalized understanding and agrees with this plan.  Will route this message to Dr [name] for further review.

## 2014-04-14 NOTE — Pre-Procedure Instructions (Signed)
Kevin English  04/14/2014   Your procedure is scheduled on:  January 28  Report to [facility] Admitting at 05:30 AM.  Call this number if you have problems the morning of surgery: [phone]   Remember:   Do not eat food or drink liquids after midnight.   Take these medicines the morning of surgery with A SIP OF WATER: Tylenol (if needed), Amiodarone, Amlodipine, Levothyroxine, Zoloft   Follow Surgeons instructions on Coumadin   STOP Fish Oil Today STOP/ Do not take Aspirin, Aleve, Naproxen, Advil, Ibuprofen, Motrin, Vitamins, Herbs, or Supplements starting today   Do not wear jewelry, make-up or nail polish.  Do not wear lotions, powders, or perfumes. You may wear deodorant.  Do not shave 48 hours prior to surgery. Men may shave face and neck.  Do not bring valuables to the hospital.  [facility] is not responsible for any belongings or valuables.               Contacts, dentures or bridgework may not be worn into surgery.  Leave suitcase in the car. After surgery it may be brought to your room.  For patients admitted to the hospital, discharge time is determined by your treatment team.               Special Instructions: [place] - Preparing for Surgery  Before surgery, you can play an important role.  Because skin is not sterile, your skin needs to be as free of germs as possible.  You can reduce the number of germs on you skin by washing with CHG (chlorahexidine gluconate) soap before surgery.  CHG is an antiseptic cleaner which kills germs and bonds with the skin to continue killing germs even after washing.  Please DO NOT use if you have an allergy to CHG or antibacterial soaps.  If your skin becomes reddened/irritated stop using the CHG and inform your nurse when you arrive at Short Stay.  Do not shave (including legs and underarms) for at least 48 hours prior to the first CHG shower.  You may shave your face.  Please follow these instructions  carefully:   1.  Shower with CHG Soap the night before surgery and the morning of Surgery.  2.  If you choose to wash your hair, wash your hair first as usual with your normal shampoo.  3.  After you shampoo, rinse your hair and body thoroughly to remove the shampoo.  4.  Use CHG as you would any other liquid soap.  You can apply CHG directly to the skin and wash gently with scrungie or a clean washcloth.  5.  Apply the CHG Soap to your body ONLY FROM THE NECK DOWN.  Do not use on open wounds or open sores.  Avoid contact with your eyes, ears, mouth and genitals (private parts).  Wash genitals (private parts) with your normal soap.  6.  Wash thoroughly, paying special attention to the area where your surgery will be performed.  7.  Thoroughly rinse your body with warm water from the neck down.  8.  DO NOT shower/wash with your normal soap after using and rinsing off the CHG Soap.  9.  Pat yourself dry with a clean towel.            10.  Wear clean pajamas.            11.  Place clean sheets on your bed the night of your first shower and do  not sleep with pets.  Day of Surgery  Do not apply any lotions the morning of surgery.  Please wear clean clothes to the hospital/surgery center.     Please read over the following fact sheets that you were given: Pain Booklet, Coughing and Deep Breathing, Blood Transfusion Information, Open Heart Packet and Surgical Site Infection Prevention

## 2014-04-15 ENCOUNTER — Other Ambulatory Visit: Payer: Self-pay | Admitting: Cardiology

## 2014-04-16 ENCOUNTER — Encounter (HOSPITAL_COMMUNITY): Payer: Self-pay

## 2014-04-16 ENCOUNTER — Encounter (HOSPITAL_COMMUNITY)
Admission: RE | Admit: 2014-04-16 | Discharge: 2014-04-16 | Disposition: A | Discharge status: Home / Self Care | Payer: Managed Care, Other (non HMO) | Source: Ambulatory Visit | Attending: Thoracic Surgery (Cardiothoracic Vascular Surgery) | Admitting: Thoracic Surgery (Cardiothoracic Vascular Surgery)

## 2014-04-16 ENCOUNTER — Inpatient Hospital Stay (HOSPITAL_COMMUNITY): Payer: Managed Care, Other (non HMO) | Admitting: Emergency Medicine

## 2014-04-16 ENCOUNTER — Encounter: Payer: Self-pay | Admitting: Thoracic Surgery (Cardiothoracic Vascular Surgery)

## 2014-04-16 ENCOUNTER — Ambulatory Visit (HOSPITAL_COMMUNITY)
Admission: RE | Admit: 2014-04-16 | Discharge: 2014-04-16 | Disposition: A | Discharge status: Home / Self Care | Payer: Managed Care, Other (non HMO) | Source: Ambulatory Visit | Attending: Thoracic Surgery (Cardiothoracic Vascular Surgery) | Admitting: Thoracic Surgery (Cardiothoracic Vascular Surgery)

## 2014-04-16 ENCOUNTER — Ambulatory Visit (INDEPENDENT_AMBULATORY_CARE_PROVIDER_SITE_OTHER): Payer: Managed Care, Other (non HMO) | Admitting: Thoracic Surgery (Cardiothoracic Vascular Surgery)

## 2014-04-16 ENCOUNTER — Inpatient Hospital Stay (HOSPITAL_COMMUNITY): Payer: Managed Care, Other (non HMO) | Admitting: Certified Registered Nurse Anesthetist

## 2014-04-16 VITALS — BP 146/87 | HR 70 | Resp 19 | Ht 68.0 in | Wt 201.0 lb

## 2014-04-16 VITALS — BP 144/79 | HR 67 | Temp 97.8°F | Resp 18 | Ht 68.0 in | Wt 201.8 lb

## 2014-04-16 DIAGNOSIS — I4891 Unspecified atrial fibrillation: Secondary | ICD-10-CM | POA: Diagnosis not present

## 2014-04-16 DIAGNOSIS — I050 Rheumatic mitral stenosis: Secondary | ICD-10-CM

## 2014-04-16 DIAGNOSIS — F17210 Nicotine dependence, cigarettes, uncomplicated: Secondary | ICD-10-CM | POA: Insufficient documentation

## 2014-04-16 LAB — PULMONARY FUNCTION TEST
DL/VA % pred: 84 %
DL/VA: 3.8 ml/min/mmHg/L
DLCO UNC % PRED: 74 %
DLCO unc: 22.08 ml/min/mmHg
FEF 25-75 POST: 2.76 L/s
FEF 25-75 PRE: 2.14 L/s
FEF2575-%Change-Post: 29 %
FEF2575-%PRED-PRE: 77 %
FEF2575-%Pred-Post: 100 %
FEV1-%CHANGE-POST: 6 %
FEV1-%PRED-PRE: 84 %
FEV1-%Pred-Post: 90 %
FEV1-PRE: 2.83 L
FEV1-Post: 3.01 L
FEV1FVC-%Change-Post: 6 %
FEV1FVC-%Pred-Pre: 98 %
FEV6-%CHANGE-POST: 0 %
FEV6-%PRED-PRE: 90 %
FEV6-%Pred-Post: 90 %
FEV6-PRE: 3.78 L
FEV6-Post: 3.8 L
FEV6FVC-%Change-Post: 0 %
FEV6FVC-%Pred-Post: 104 %
FEV6FVC-%Pred-Pre: 104 %
FVC-%Change-Post: 0 %
FVC-%Pred-Post: 86 %
FVC-%Pred-Pre: 86 %
FVC-Post: 3.81 L
FVC-Pre: 3.82 L
Post FEV1/FVC ratio: 79 %
Post FEV6/FVC ratio: 100 %
Pre FEV1/FVC ratio: 74 %
Pre FEV6/FVC Ratio: 99 %
RV % pred: 87 %
RV: 1.87 L
TLC % PRED: 85 %
TLC: 5.6 L

## 2014-04-16 LAB — URINALYSIS, ROUTINE W REFLEX MICROSCOPIC
Bilirubin Urine: NEGATIVE
Glucose, UA: NEGATIVE mg/dL
Hgb urine dipstick: NEGATIVE
Ketones, ur: NEGATIVE mg/dL
Leukocytes, UA: NEGATIVE
NITRITE: NEGATIVE
PH: 6 (ref 5.0–8.0)
Protein, ur: NEGATIVE mg/dL
Specific Gravity, Urine: 1.006 (ref 1.005–1.030)
UROBILINOGEN UA: 0.2 mg/dL (ref 0.0–1.0)

## 2014-04-16 LAB — BLOOD GAS, ARTERIAL
Acid-base deficit: 1.8 mmol/L (ref 0.0–2.0)
BICARBONATE: 21.9 meq/L (ref 20.0–24.0)
DRAWN BY: 428831
FIO2: 0.21 %
O2 Saturation: 95.2 %
PH ART: 7.423 (ref 7.350–7.450)
Patient temperature: 98.6
TCO2: 23 mmol/L (ref 0–100)
pCO2 arterial: 34.2 mmHg — ABNORMAL LOW (ref 35.0–45.0)
pO2, Arterial: 72.4 mmHg — ABNORMAL LOW (ref 80.0–100.0)

## 2014-04-16 LAB — COMPREHENSIVE METABOLIC PANEL
ALK PHOS: 110 U/L (ref 39–117)
ALT: 42 U/L (ref 0–53)
ANION GAP: 10 (ref 5–15)
AST: 26 U/L (ref 0–37)
Albumin: 3.9 g/dL (ref 3.5–5.2)
BILIRUBIN TOTAL: 1 mg/dL (ref 0.3–1.2)
BUN: 18 mg/dL (ref 6–23)
CHLORIDE: 107 mmol/L (ref 96–112)
CO2: 19 mmol/L (ref 19–32)
Calcium: 9.8 mg/dL (ref 8.4–10.5)
Creatinine, Ser: 1.64 mg/dL — ABNORMAL HIGH (ref 0.50–1.35)
GFR calc Af Amer: 51 mL/min — ABNORMAL LOW (ref >90)
GFR calc non Af Amer: 44 mL/min — ABNORMAL LOW (ref >90)
Glucose, Bld: 97 mg/dL (ref 70–99)
Potassium: 4.5 mmol/L (ref 3.5–5.1)
Sodium: 136 mmol/L (ref 135–145)
Total Protein: 7.2 g/dL (ref 6.0–8.3)

## 2014-04-16 LAB — APTT: aPTT: 42 seconds — ABNORMAL HIGH (ref 24–37)

## 2014-04-16 LAB — HEMOGLOBIN A1C
HEMOGLOBIN A1C: 6.4 % — AB (ref <5.7)
Mean Plasma Glucose: 137 mg/dL — ABNORMAL HIGH (ref <117)

## 2014-04-16 LAB — CBC
HEMATOCRIT: 46.8 % (ref 39.0–52.0)
HEMOGLOBIN: 16.3 g/dL (ref 13.0–17.0)
MCH: 31.7 pg (ref 26.0–34.0)
MCHC: 34.8 g/dL (ref 30.0–36.0)
MCV: 90.9 fL (ref 78.0–100.0)
Platelets: 238 10*3/uL (ref 150–400)
RBC: 5.15 MIL/uL (ref 4.22–5.81)
RDW: 13.7 % (ref 11.5–15.5)
WBC: 13.1 10*3/uL — ABNORMAL HIGH (ref 4.0–10.5)

## 2014-04-16 LAB — TYPE AND SCREEN
ABO/RH(D): O NEG
ANTIBODY SCREEN: NEGATIVE

## 2014-04-16 LAB — PROTIME-INR
INR: 1.6 — ABNORMAL HIGH (ref 0.00–1.49)
PROTHROMBIN TIME: 19.2 s — AB (ref 11.6–15.2)

## 2014-04-16 LAB — SURGICAL PCR SCREEN
MRSA, PCR: NEGATIVE
Staphylococcus aureus: NEGATIVE

## 2014-04-16 LAB — ABO/RH: ABO/RH(D): O NEG

## 2014-04-16 MED ORDER — ALBUTEROL SULFATE (2.5 MG/3ML) 0.083% IN NEBU
2.5000 mg | INHALATION_SOLUTION | Freq: Once | RESPIRATORY_TRACT | Status: AC
  Administered 2014-04-16: 2.5 mg via RESPIRATORY_TRACT

## 2014-04-16 NOTE — Progress Notes (Signed)
[address]       [place] [address]             [phone]     CARDIOTHORACIC SURGERY OFFICE NOTE  Referring Provider is Dorothy Spark, MD PCP is Gilford Rile, MD   HPI:  Patient returns for followup of rheumatic mitral valve disease with stage D severe symptomatic mitral stenosis associated with chronic diastolic congestive heart failure and recurrent paroxysmal atrial fibrillation. The patient was Last seen in the office on 04/02/2014 at which time we made plans to proceed with elective minimally invasive mitral valve repair or replacement and maze procedure later this week on 04/19/2014.  Since his last visit Mr. Dirico underwent follow-up CT angiogram of the chest abdomen and pelvis. He stopped taking Coumadin 3 days ago in anticipation of his upcoming surgery. He reports no new problems or complaints over the past 2 weeks.  Current Outpatient Prescriptions  Medication Sig Dispense Refill  . acetaminophen (TYLENOL) 325 MG tablet Take 650 mg by mouth every 6 (six) hours as needed (pain).    Marland Kitchen ALPRAZolam (XANAX) 0.5 MG tablet Take 0.5-1 mg by mouth at bedtime as needed for anxiety.     Marland Kitchen amiodarone (PACERONE) 200 MG tablet Take 1 tablet (200 mg total) by mouth every morning. 30 tablet 6  . amLODipine (NORVASC) 10 MG tablet Take 1 tablet (10 mg total) by mouth 2 (two) times daily. 90 tablet 3  . levothyroxine (SYNTHROID) 75 MCG tablet Take 1 tablet (75 mcg total) by mouth daily before breakfast. 90 tablet 3  . pseudoephedrine-acetaminophen (TYLENOL SINUS) 30-500 MG TABS Take 1 tablet by mouth every 4 (four) hours as needed (sinus pressure).    . sertraline (ZOLOFT) 50 MG tablet Take 1 tablet (50 mg total) by mouth daily. 90 tablet 4  . warfarin (COUMADIN) 5 MG tablet Take 2.5-5 mg by mouth daily. Sun Tues Thurs 2.5 mg. , and Mon Wed Fri and Sat 5 mg     No current facility-administered medications for this visit.      Physical Exam:   BP 146/87 mmHg   Pulse 70  Resp 19  Ht 5\' 8"  (1.727 m)  Wt 201 lb (91.173 kg)  BMI 30.57 kg/m2  SpO2 98%  General:  Well-appearing  Chest:   Clear  CV:   Regular rate and rhythm  Incisions:  n/a  Abdomen:  Soft  Extremities:  Warm  Diagnostic Tests:  CT ANGIOGRAPHY CHEST, ABDOMEN AND PELVIS  TECHNIQUE: Multidetector CT imaging through the chest, abdomen and pelvis was performed using the standard protocol during bolus administration of intravenous contrast. Multiplanar reconstructed images and MIPs were obtained and reviewed to evaluate the vascular anatomy.  CONTRAST: 54mL OMNIPAQUE IOHEXOL 350 MG/ML SOLN  COMPARISON: 08/25/2013  FINDINGS: CTA CHEST FINDINGS  Vascular: Normal caliber of the thoracic aorta without dissection or intramural hematoma. There are a few coronary artery calcifications. Normal configuration of the great vessels. The great vessels are patent.  Negative for chest lymphadenopathy. No significant pericardial or pleural fluid. The trachea and mainstem bronchi are patent. There is a new 5 mm nodule in the right lower lobe on sequence 9, image 48. The margins of this nodule are mildly hazy and there appears to be additional punctate densities in this area. Stable 5 mm nodule in the superior segment of the right lower lobe on sequence 9, image 33. Punctate density in the left upper lung on sequence 9, image 17 is unchanged.  Few linear densities at the left lung base are most compatible with atelectasis.  No acute bone abnormality.  Review of the MIP images confirms the above findings.  CTA ABDOMEN AND PELVIS FINDINGS  Vascular findings: There is variant celiac anatomy. The splenic artery and left gastric artery originate from the celiac trunk. The common hepatic artery originates from the superior mesenteric artery. The celiac trunk and SMA are widely patent. Single left renal artery without significant atherosclerotic disease or stenosis. Single  right renal artery has been ligated just beyond the origin and compatible with a nephrectomy. There is mild atherosclerotic disease in the infrarenal abdominal aorta without aneurysm. Negative for an aortic dissection. The inferior mesenteric artery is patent. There is atherosclerotic disease in the iliac arteries. Approximately 50% narrowing of the mid right common iliac artery due to calcified and noncalcified plaque and similar to prior examination. Right common iliac artery disease is best seen on sequence 5, image 182. The right internal iliac artery and branches are patent. There is mild atherosclerotic disease in the right external iliac artery. Minimum diameter of the right external artery is roughly 6 mm. The proximal right femoral arteries are patent. Mild atherosclerotic disease in left common iliac artery. There is artifact on sequence 5, image 179 which limits evaluation for narrowing in the left common iliac artery. Overall, there does not appear to be significant left iliac artery stenosis. The left internal and external iliac arteries are patent. The left external iliac artery is small measuring roughly 5 mm . Proximal left femoral arteries are patent.  Patient has undergone a right nephrectomy. Small lymph nodes in the pericaval stations are similar to the pre-surgical examination. Again noted are at least 3 low-density structures within the left kidney. There is an exophytic 1.1 cm structure with Hounsfield units of approximately 28 on the arterial phase of imaging. This structure roughly measured 1.0 cm on the previous examination. Based on the Hounsfield units, this exophytic low-density structure is indeterminate.  Normal appearance of the adrenal glands, spleen, pancreas, liver and gallbladder. Normal appearance of the prostate and urinary bladder. Again noted are large bilateral inguinal hernias containing fat. Normal appearance of the small and large bowel.  No acute bone abnormality. Venous phase images in the pelvis are limited due to excessive motion artifact.  Review of the MIP images confirms the above findings.  IMPRESSION: Negative for aortic dissection or aortic aneurysm. There is atherosclerotic disease in the iliac arteries bilaterally. Small caliber of the external iliac arteries bilaterally. These vascular findings are similar to the previous examination.  Post right nephrectomy for renal cell carcinoma. No evidence for local tumor recurrence. However, the patient has a new 5 mm nodule in the right lower lobe. This new pulmonary nodule has slightly irregular margins with small densities around it. This new nodule could be inflammatory in etiology but metastatic disease cannot be excluded. Recommend a 3-6 month follow-up to evaluate for resolution or progression. There is also a stable 5 mm nodule in the superior segment of the right lower lobe.  There are at least 3 low-density structures within the left kidney. The largest roughly measures 1.1 cm and the Hounsfield units make this lesion indeterminate. Based on the history of renal cell carcinoma, recommend more complete evaluation of this lesion with a pre and post contrast MRI.  Large bilateral inguinal hernias containing fat.  These results will be called to the ordering clinician or representative by the Radiologist Assistant, and communication documented in the PACS  or zVision Dashboard.   Electronically Signed  By: Markus Daft M.D.  On: 04/10/2014 15:27   Impression:  The patient has long-standing rheumatic mitral stenosis with chronic diastolic congestive heart failure and recurrent paroxysmal atrial fibrillation. He has remained clinically stable from a cardiac standpoint since he underwent radical nephrectomy last June and he continues to maintain sinus rhythm on amiodarone. However, he continues to experience shortness of breath and fatigue with  any significant physical exertion consistent with chronic diastolic congestive heart failure, New [place] Heart Association functional class 2-3.  Follow-up CT scan demonstrates expected radiographic findings status post right radical nephrectomy for early stage renal cell carcinoma. There are some small nodular abnormalities in limited noted in the left kidney which appear essentially stable in comparison with the CT scan performed last June. These will be followed by the patient's urologist. CT scan also raises the question of a small nodular opacity in the right lower lobe. I have personally reviewed this CT scan and I am not impressed by these findings. However, long-term follow-up will certainly be indicated.    Plan:  I discussed the results of the patient's recent CT scan with the patient and his wife in the office today. The patient is eager to proceed with surgery as soon as practical. We plan to proceed with minimally invasive mitral valve repair or replacement and Maze procedure as previously scheduled on Thursday, 04/19/2014. The patient understands that there is a fairly high likelihood his valve will not be repairable, and under the circumstances he requests that his valve be replaced using a mechanical prosthesis. We again reviewed the indications, risks, and potential benefits of surgery.  We discussed expectations regarding his postoperative recovery and convalescence at length. All of their questions have been answered.  I spent in excess of 30 minutes during the conduct of this office consultation and >50% of this time involved direct face-to-face encounter with the patient for counseling and/or coordination of their care.    Valentina Gu. Roxy Manns, MD 04/16/2014 2:09 PM

## 2014-04-16 NOTE — H&P (Signed)
[address]       [place] [address]             [phone]          CARDIOTHORACIC SURGERY HISTORY AND PHYSICAL EXAM  PCP is English,GREG, MD Referring Provider is Kevin Dawley, MD   Reason for consultation: mitral stenosis    HPI:  Patient is a 59 year old white male from [birth year] with recently discovered rheumatic mitral stenosis associated with recurrent paroxysmal atrial fibrillation and acute exacerbation of chronic diastolic congestive heart failure. The patient states that he first began to experience severe chronic fatigue with exertional shortness of breath and intermittent dizzy spells more than 4 years ago. He underwent an echocardiogram and was evaluated by a cardiologist in [place] at that time. He was told that there did not appear to be anything wrong. Symptoms progressed and the patient states he was forced to quit his job because of severe fatigue. Since then he has continued to experience severe chronic fatigue. He was diagnosed with obstructive sleep apnea and uses CPAP at night. This has not substantially improved his symptoms. He was referred to Dr. Meda English for cardiology consultation in September of 2014. His primary complaint at that time remained severe fatigue and exhaustion. He was started on Synthroid for hypothyroidism. Symptoms persisted, and more recently he began to experience tachypalpitations associated with dizzy spells and severe fatigue. He was seen in followup by Dr Kevin English last spring and an echocardiogram and 30 day monitor were obtained. The patient's event monitor documented recurrent episodes of atrial fibrillation with rapid ventricular rate. Transthoracic echocardiogram revealed likely rheumatic heart disease with severe mitral stenosis and normal left ventricular systolic function. The patient was hospitalized last May with an acute exacerbation of symptoms of shortness of breath and palpitations, at which time he  was found to be in atrial flutter with 2:1 block.  I had the opportunity to see him in consultation at that time. He subsequently converted back to sinus rhythm on amiodarone. He was seen in followup on 08/24/2013 and plans were made to proceed with minimally invasive mitral valve repair or replacement and Maze procedure later that month. However, CT angiogram performed 08/25/2013 revealed a mass in the right kidney suspicious for renal cell carcinoma. Because the patient remained in sinus rhythm and very stable from a cardiac standpoint, plans for cardiac surgery were postponed. The patient subsequently underwent robotic-assisted right radical nephrectomy on 09/05/2013 by Dr. Rolan English. Final pathology was consistent with clear cell renal cell carcinoma, Fuhrman grade 2-3, with maximum tumor diameter 2.7 cm, all tumor to find confined to the kidney, and all surgical margins negative (T1aN0). His postoperative recovery was uncomplicated, and his serum creatinine level has stabilized at 2.0. Postoperatively the patient did not experience any problems with recurrent paroxysmal atrial fibrillation or congestive heart failure, and he has been followed in the office on several occasions by Dr. Meda English. He returns with hope to proceed with mitral valve repair or replacement and Maze procedure. He states that over the last few months he has continued to feel better as time progresses. He still gets short of breath and tired quite easily with any physical exertion. However, his energy level has improved and he feels as though he is finally recovered completely from his nephrectomy. He has not had any palpitations no other signs to suggest a recurrence of atrial fibrillation. The remainder of his review of systems is unremarkable.  The patient was  last seen in the office on 04/02/2014 at which time we made plans to proceed with elective minimally invasive mitral valve repair or replacement and maze procedure  later this week on 04/19/2014. Since his last visit Kevin English underwent follow-up CT angiogram of the chest abdomen and pelvis. He stopped taking Coumadin 3 days ago in anticipation of his upcoming surgery. He reports no new problems or complaints over the past 2 weeks.    Past Medical History  Diagnosis Date  . Hernia   . Chronic rhinitis   . Diverticulitis of colon 15 years ago  . Hyperlipidemia   . Hypogonadism male   . Leukocytosis   . Myalgia and myositis, unspecified   . Obesity   . Hypothyroidism   . PAF (paroxysmal atrial fibrillation)     a. sustained 160-180 on e-CARDIO monitor placed 07/29/2013. Placed on IV amiodarone at end of May 2015 due to continued paroxysms with RVR.  Marland Kitchen Rheumatic heart disease   . Chronic diastolic CHF (congestive heart failure)     a. Occurs in context of AF-RVR with mitral stenosis.  . Mitral stenosis     a. Dx 07/2013 - through workup of 2D echo, TEE, and cath, felt to be moderate.  . Paroxysmal atrial flutter   . CAD (coronary artery disease)     a. By cath 08/2013: 70% distal cx-prox LPDA; tandem mod LAD lesions, o/w mild dz.  . Renal mass     a. Dx 08/2013: concerning for renal cancer.  Marland Kitchen HTN (hypertension)     hx of- under control  . Stroke 1998    "MRI showed 3 mild strokes"  . Borderline diabetes   . Depression   . Anxiety   . Dizzy spells   . Deafness in left ear   . Mitral stenosis 08/02/2013  . Lung nodule seen on imaging study 04/10/2014    5 mm nodule right lower lobe  . Dysrhythmia     atrial fib  . OSA on CPAP     cpap  . Shortness of breath dyspnea     exertion  . CKD (chronic kidney disease), stage II     his creat. runs 2-2.5; hasnt seen neph yet - sees dr. Tresa English at [facility]  . Renal cell carcinoma of right kidney 09/05/2013    clear cell renal cell carcinoma of right kidney treated by radical nephrectomy, Fuhrman grade 2-3, with maximum tumor diameter 2.7 cm, all tumor to find confined to the kidney, and all surgical  margins negative (T1aN0).     Past Surgical History  Procedure Laterality Date  . Tee without cardioversion N/A 08/16/2013    Procedure: TRANSESOPHAGEAL ECHOCARDIOGRAM (TEE);  Surgeon: Dorothy Spark, MD;  Location: [facility];  Service: Cardiovascular;  Laterality: N/A;  . Inguinal hernia repair Bilateral first one in 80's    "I had one side done twice"  . Cardiac catheterization  08/28/13  . Eye surgery Right 1990    "reconstructive, lens implant- from paintball accident"  . Robot assisted laparoscopic nephrectomy Right 09/05/2013    Procedure: ROBOTIC ASSISTED LAPAROSCOPIC RIGHT RADICAL NEPHRECTOMY;  Surgeon: Sharyn Creamer, MD;  Location: [facility];  Service: Urology;  Laterality: Right;  . Left and right heart catheterization with coronary angiogram N/A 08/28/2013    Procedure: LEFT AND RIGHT HEART CATHETERIZATION WITH CORONARY ANGIOGRAM;  Surgeon: Leonie Man, MD;  Location: [facility] CATH LAB;  Service: Cardiovascular;  Laterality: N/A;    Family History  Problem Relation Age of  Onset  . Hypertension    . Hypertension Mother     Social History History  Substance Use Topics  . Smoking status: Current Every Day Smoker -- 0.25 packs/day for 40 years    Types: Cigarettes  . Smokeless tobacco: Never Used  . Alcohol Use: No    Prior to Admission medications   Medication Sig Start Date End Date Taking? Authorizing Provider  acetaminophen (TYLENOL) 325 MG tablet Take 650 mg by mouth every 6 (six) hours as needed (pain).   Yes Historical Provider, MD  ALPRAZolam Duanne Moron) 0.5 MG tablet Take 0.5-1 mg by mouth at bedtime as needed for anxiety.    Yes Historical Provider, MD  amiodarone (PACERONE) 200 MG tablet Take 1 tablet (200 mg total) by mouth every morning. 02/01/14  Yes Dorothy Spark, MD  amLODipine (NORVASC) 10 MG tablet Take 1 tablet (10 mg total) by mouth 2 (two) times daily. 01/16/14  Yes Dorothy Spark, MD  levothyroxine (SYNTHROID) 75 MCG tablet Take 1 tablet (75 mcg  total) by mouth daily before breakfast. 09/21/13  Yes Dorothy Spark, MD  Omega-3 Fatty Acids (FISH OIL) 1200 MG CPDR Take 4 capsules by mouth daily.   Yes Historical Provider, MD  pseudoephedrine-acetaminophen (TYLENOL SINUS) 30-500 MG TABS Take 1 tablet by mouth every 4 (four) hours as needed (sinus pressure).   Yes Historical Provider, MD  sertraline (ZOLOFT) 50 MG tablet Take 1 tablet (50 mg total) by mouth daily. 03/20/14  Yes Dorothy Spark, MD  warfarin (COUMADIN) 5 MG tablet Take 2.5-5 mg by mouth daily. Sun Tues Thurs 2.5 mg. , and Mon Wed Fri and Sat 5 mg   Yes Historical Provider, MD  warfarin (COUMADIN) 5 MG tablet TAKE 1 TABLET BY MOUTH DAILY. 04/16/14   Dorothy Spark, MD    Allergies  Allergen Reactions  . Contrast Media [Iodinated Diagnostic Agents] Hives    Patient started sneezing and coughing, patient broke out in hives, patient needs 13 hour prep if having IV contrast  . Atorvastatin Other (See Comments)    Myalgias  . Pravastatin Other (See Comments)    Myalgias   . Fenofibrate     MYALGIAS       Review of Systems:  General:normal appetite, decreased energy, no weight gain, no weight loss, no fever Cardiac:no chest pain with exertion, + chest pain at rest associated with tachypalpitations, + SOB with exertion, no resting SOB, no PND, no orthopnea, + palpitations, + arrhythmia, + atrial fibrillation, no LE edema, + dizzy spells, no syncope Respiratory:+ shortness of breath, no home oxygen, no productive cough, + chronic dry cough, no bronchitis, no wheezing, no hemoptysis, no asthma, no pain with inspiration or cough, + sleep apnea, + CPAP at night GI:no difficulty swallowing, no reflux, no frequent heartburn, no hiatal hernia, no abdominal pain, + occasional constipation, no diarrhea, no hematochezia, no hematemesis, no  melena GU:no dysuria, no frequency, no urinary tract infection, no hematuria, no enlarged prostate, no kidney stones, no kidney disease Vascular:no pain suggestive of claudication, no pain in feet, no leg cramps, no varicose veins, no DVT, no non-healing foot ulcer Neuro:no stroke, + TIA in the remote past, no seizures, no headaches, no temporary blindness one eye, no slurred speech, no peripheral neuropathy, no chronic pain, no instability of gait, no memory/cognitive dysfunction Musculoskeletal:no arthritis, no joint swelling, no myalgias, no difficulty walking, normal mobility  Skin:no rash, no itching, no skin infections, no pressure sores or ulcerations Psych:no anxiety, + depression,  no nervousness, no unusual recent stress Eyes:no blurry vision, no floaters, no recent vision changes, + wears glasses or contacts ENT:no hearing loss, no loose or painful teeth, no dentures, last saw dentist within the past year Hematologic:no easy bruising, no abnormal bleeding, no clotting disorder, no frequent epistaxis Endocrine:no diabetes, does not check CBG's at home   Physical Exam:  BP 139/80  Pulse 62  Temp(Src) 97.8 F (36.6 C) (Oral)  Resp 18  Ht 5\' 8"  (1.727 m)  Wt 89.676 kg (197 lb 11.2 oz)  BMI 30.07 kg/m2  SpO2 99% General: well-appearing HEENT:Unremarkable  Neck:no JVD, no bruits, no adenopathy  Chest:clear to auscultation, symmetrical breath sounds, no wheezes,  no rhonchi  CV:RRR, no murmur  Abdomen:soft, non-tender, no masses  Extremities:warm, well-perfused, pulses palpable, no lower extremity edema Rectal/GUDeferred Neuro:Grossly non-focal and symmetrical throughout Skin:Clean and dry, no rashes, no breakdown  Diagnostic Tests:  Transthoracic Echocardiography  Patient:  Kevin English, Kevin English MR #:    [id number] Study Date: 07/20/2013 Gender:   M Age:    58 Height:   170.2cm Weight:   90.7kg BSA:    2.32m^2 Pt. Status: Room:  ATTENDING  Croitoru, Mihai SONOGRAPHER [place], [facility]   Kevin English, M.D. REFERRING  Kevin English, M.D. PERFORMING  [facility], Outpatient cc:  ------------------------------------------------------------ LV EF: 55% -  60%  ------------------------------------------------------------ Indications:   Fatigue/malaise - chronic 780.71. Shortness of breath 786.05.  ------------------------------------------------------------ History:  PMH: Acquired from the patient and from the patient's chart. Fatigue and dyspnea. Risk factors: Current tobacco use. Hypertension. Dyslipidemia.  ------------------------------------------------------------ Study Conclusions  - Left ventricle: The cavity size was normal. Systolic function was normal. The estimated ejection fraction was in the range of 55% to 60%. Wall motion was normal; there were no regional wall motion abnormalities. Doppler parameters are consistent with elevated mean left atrial filling pressure. - Aortic valve: Mild regurgitation. - Mitral valve: Thickening, consistent with rheumatic disease. Mobility of the anterior leaflet was moderately restricted. The process was limited to the  leaflet tips, and leaflet body motion was preserved.Diastolic leaflet doming was present. The findings are consistent with moderate stenosis. Valve area by pressure half-time: 1.26cm^2. Valve area by continuity equation (using LVOT flow): 0.98cm^2. - Left atrium: The atrium was moderately dilated. Transthoracic echocardiography. M-mode, complete 2D, spectral Doppler, and color Doppler. Height: Height: 170.2cm. Height: 67in. Weight: Weight: 90.7kg. Weight: 199.6lb. Body mass index: BMI: 31.3kg/m^2. Body surface area:  BSA: 2.2m^2. Blood pressure:   128/87. Patient status: Outpatient. Location: [facility]  ------------------------------------------------------------  ------------------------------------------------------------ Left ventricle: The cavity size was normal. Systolic function was normal. The estimated ejection fraction was in the range of 55% to 60%. Wall motion was normal; there were no regional wall motion abnormalities. Doppler parameters are consistent with elevated mean left atrial filling pressure.  ------------------------------------------------------------ Aortic valve:  Structurally normal valve. Trileaflet. Cusp separation was normal. Doppler: Transvalvular velocity was within the normal range. There was no stenosis. Mild regurgitation.  ------------------------------------------------------------ Mitral valve:  Thickening, consistent with rheumatic disease. Mobility of the anterior leaflet was moderately restricted. The process was limited to the leaflet tips, and leaflet body motion was preserved.Diastolic leaflet doming was present. (Heart rate 63 bpm) Doppler:  The findings are consistent with moderate stenosis.   Valve area by pressure half-time: 1.26cm^2. Indexed valve area by pressure half-time: 0.62cm^2/m^2. Valve area by continuity equation (using LVOT flow): 0.98cm^2. Indexed valve area by continuity  equation (using LVOT flow): 0.49cm^2/m^2.  Mean gradient: 3mm Hg (D). Peak gradient: 59mm  Hg (D).  ------------------------------------------------------------ Left atrium: The atrium was moderately dilated.  ------------------------------------------------------------ Right ventricle: The cavity size was normal. Wall thickness was normal. Systolic function was normal.  ------------------------------------------------------------ Pulmonic valve:  Poorly visualized. The valve appears to be grossly normal.  Doppler: Transvalvular velocity was within the normal range. No regurgitation.  ------------------------------------------------------------ Tricuspid valve:  Structurally normal valve.  Leaflet separation was normal. Doppler: Transvalvular velocity was within the normal range. Trivial regurgitation.  ------------------------------------------------------------ Pulmonary artery:  Systolic pressure could not be accurately estimated.  ------------------------------------------------------------ Right atrium: The atrium was normal in size.  ------------------------------------------------------------ Pericardium: There was no pericardial effusion.  ------------------------------------------------------------  2D measurements    Normal Doppler measurements  Normal Left ventricle         Left ventricle LVID ED,  45.4 mm   43-52  Ea, lat  5.37 cm/s  ------ chord,             ann, tiss PLAX              DP LVID ES,  30.7 mm   23-38  E/Ea, lat 27.3    ------ chord,             ann, tiss   7 PLAX              DP FS, chord,  32 %   >29   Ea, med  4.83 cm/s  ------ PLAX              ann, tiss LVPW, ED  12.2 mm   ------ DP IVS/LVPW  0.9    <1.3  E/Ea, med 30.4    ------ ratio, ED           ann, tiss   3 Ventricular septum        DP IVS, ED   11 mm   ------ LVOT LVOT              Peak vel,  101 cm/s  ------ Diam, S   20 mm   ------ S Area    3.14 cm^2  ------ VTI, S   21.6 cm   ------ Diam     20 mm   ------ Stroke vol 67.9 ml   ------ Aorta             Stroke   33.6 ml/m^2 ------ Root diam,  31 mm   ------ index ED               Aortic valve Left atrium          Regurg PHT 743 ms   ------ AP dim    45 mm   ------ Mitral valve AP dim   2.23 cm/m^2 <2.2  Peak E vel 147 cm/s  ------ index             Peak A vel 150 cm/s  ------                Mean vel,  115 cm/s  ------                D                Decelerati 662 ms   150-23                on time        0                Pressure  175 ms   ------  half-time                Mean     7 mm Hg ------                gradient,                D                Peak     16 mm Hg ------                gradient,                D                Peak E/A   1    ------                ratio                Area (PHT) 1.26 cm^2  ------                Area index 0.62 cm^2/m ------                (PHT)      ^2                Area    0.98 cm^2  ------                (LVOT)                continuity                Area index 0.49 cm^2/m ------                (LVOT      ^2                cont)                Annulus  69.3 cm   ------                VTI                Max regurg 453  cm/s  ------                vel                Tricuspid valve                Regurg   174 cm/s  ------                peak vel                Peak RV-RA  12 mm Hg ------                gradient,                S                Systemic veins                Estimated   3 mm Hg ------                CVP                Right ventricle  Pressure,  15 mm Hg <30                S                Sa vel,  10.2 cm/s  ------                lat ann,                tiss DP  ------------------------------------------------------------ Prepared and Electronically Authenticated by  [employer], Mihai 2015-04-30T17:23:[id number]      Transesophageal Echocardiogram Note   Kevin English  [id number]  1955/03/28  Procedure: Transesophageal Echocardiogram  Indications: Rheumatic heart disease, mitral stenosis  Procedure Details  Consent: Obtained  Time Out: Verified patient identification, verified procedure, site/side was marked, verified correct patient position, special equipment/implants available, Radiology Safety Procedures followed, medications/allergies/relevent history reviewed, required imaging and test results available. Performed  Medications:  Fentanyl: 75 mcg  Versed: 4 mg  Left Ventrical: The cavity size was normal with normal LVEF 55-60%, no WMA, no LVH.  Mitral Valve: Hockey stick appearance of the mitral valve, predominantly of the anterior leaflet that is thickened and mildly calcified. Restricted opening. Peak trans mitral gradient 15 mmHg, mean gradient 7 mmHg. Mobility is restricted predominantly of the anterior leaflet. Diastolic leaflet doming was present.  Moderate mitral stenosis,  trace mitral regurgitation.  Mitral annuloplasty score: 7  Aortic Valve: Mild aortic insufficiency.  Tricuspid Valve: Trace TR.  Pulmonic Valve: Grossly normal.  Left Atrium/ Left atrial appendage: Left atrium is moderately dilated. No thrombus, normal filling and emptying velocities.  Atrial septum: Intact, no ASD or PFO by color Doppler.  Aorta: Minimal AS plague.  Complications: No apparent complications  Patient did tolerate procedure well.  Dorothy Spark, MD, [facility]  08/16/2013, 9:27 AM   Patient: Kevin English, Kevin English MR #: [id number] Study Date: 08/16/2013 Gender: M Age: 89 Height: 172.7 cm Weight: 89.5 kg BSA: 2.1 m^2 Pt. Status: Room:  Tonia Ghent, Doreene Burke REFERRING Erlene Quan SONOGRAPHER Joanie Coddington, RDCS ADMITTING Kevin English, M.D. ATTENDING Kevin English, M.D. PERFORMING Kevin English, M.D.  cc:  -------------------------------------------------------------------  ------------------------------------------------------------------- Indications: 424.0 Mitral valve disease. 786.05 Dyspnea.  ------------------------------------------------------------------- Study Conclusions  - Left ventricle: The cavity size was normal. Wall thickness was normal. Systolic function was normal. Wall motion was normal; there were no regional wall motion abnormalities. - Aortic valve: There was mild regurgitation. - Mitral valve: Valve area by pressure half-time: 1.4 cm^2. - Left atrium: The atrium was moderately dilated. No evidence of thrombus in the atrial cavity or appendage. No evidence of thrombus in the atrial cavity or appendage. - Right atrium: No evidence of thrombus in the atrial cavity or appendage.  Impressions:  - Rheumatic mitral valve with moderate leaflet tip thickening and minimal calcifications. Moderate mitral stenosis and trace mitral egurgitation.  The anatomy seems to be favorable for a transcutaneous balloon valvuloplasty  (Score 6).  -------------------------------------------------------------------  ------------------------------------------------------------------- Left ventricle: The cavity size was normal. Wall thickness was normal. Systolic function was normal. Wall motion was normal; there were no regional wall motion abnormalities.  ------------------------------------------------------------------- Aortic valve: Structurally normal valve. Trileaflet; normal thickness leaflets. Cusp separation was normal. Doppler: There was mild regurgitation.  ------------------------------------------------------------------- Aorta: There was no atheroma. There was no evidence for dissection. Aortic root: The aortic root was not dilated. Ascending aorta: The ascending aorta was normal in size. Aortic arch: The aortic arch was normal in size. Descending aorta: The descending aorta was normal  in size.  ------------------------------------------------------------------- Mitral valve: There is moderate thickening and minimal calcification of the mitral valve leaflets consistent with rheumatic disease. Thickenning is affecting the leaflets tips sparing the rest of the leaflets. Anterior leaflet has typical hockey stick appearance and has moderately restricted mobility. Anterior leaflet is doming into the left atrium in systole. The findings are consistent with moderate stenosis. Mean transmitral gradient is 7 mmHg. Valve area by pressure halftime: 1.4 cm2.  There is only trace mitral regurgitation. Structurally normal valve. Leaflet separation was normal. Doppler: There was no significant regurgitation. Valve area by pressure half-time: 1.4 cm^2. Indexed valve area by pressure half-time: 0.73 cm^2/m^2. Mean gradient (D): 7 mm Hg. Peak gradient (D): 9 mm Hg.  ------------------------------------------------------------------- Left atrium: The atrium was moderately dilated. No evidence of thrombus in the  atrial cavity or appendage. No evidence of thrombus in the atrial cavity or appendage. The appendage was morphologically a left appendage, multilobulated, and of normal size. Emptying velocity was normal.  ------------------------------------------------------------------- Right ventricle: The cavity size was normal. Wall thickness was normal. Systolic function was normal.  ------------------------------------------------------------------- Pulmonic valve: Structurally normal valve.  ------------------------------------------------------------------- Tricuspid valve: Structurally normal valve. Leaflet separation was normal. Doppler: There was mild regurgitation.  ------------------------------------------------------------------- Pulmonary artery: The main pulmonary artery was normal-sized.  ------------------------------------------------------------------- Right atrium: The atrium was normal in size. No evidence of thrombus in the atrial cavity or appendage. The appendage was morphologically a right appendage.  ------------------------------------------------------------------- Pericardium: There was no pericardial effusion.  ------------------------------------------------------------------- Prepared and Electronically Authenticated by  Kevin English, M.D. 2015-05-28T10:41:20  ------------------------------------------------------------------- Measurements  Mitral valve Value 07/20/2013 Reference Mitral E-wave peak 146.67 cm/s 147 --------- velocity Mitral mean 124 cm/s 115 --------- velocity, D Mitral deceleration 297.12 cm/s^2 ---------- --------- slope Mitral deceleration (H) 494 ms 662 150 - 230 time Mitral pressure 143 ms 175 --------- half-time Mitral mean 7 mm Hg 7 --------- gradient, D Mitral peak 9 mm Hg 16 --------- gradient, D Mitral valve area, 1.54 cm^2 1.26 --------- PHT, DP Mitral valve 0.73 cm^2/m^2 0.62 --------- area/bsa, PHT, DP Mitral  annulus VTI, 60.3 cm 69.3 --------- D  Pulmonary arteries Value 07/20/2013 Reference PA pressure, S, DP (N) 30 mm Hg ---------- <=30  Legend: (L) and (H) mark values outside specified reference range.  (N) marks values inside specified reference range.    CARDIAC CATHETERIZATION REPORT  NAME: Kevin English [id number]:[id number] DOB: 1957/02/17ADMIT DATE: 08/28/2013 Procedure Date: 08/28/2013  INTERVENTIONAL CARDIOLOGIST: Leonie Man, M.D., MS PRIMARY CARE PROVIDER: Gilford Rile, MD PRIMARY CARDIOLOGIST: Dorris Carnes, MD & Kevin Dawley, MD CT Surgeon: Dr. Roxy Manns  PATIENT: Kevin English is a 59 y.o. male known rheumatic heart disease with mitral stenosis referred for right heart catheterization as part of evaluation for preop mitral valve replacement. His recent trouble with atrial fibrillation with heart failure therefore is being sent for valve replacement as opposed to balloon valvuloplasty.  PRE-OPERATIVE DIAGNOSIS:   Moderate-severe marked stenosis  Worsening heart failure symptoms  Atrial fibrillation, recurrent  PROCEDURES PERFORMED:   Right & Left Heart Catheterization with Native Coronary Angiography via Right Radial Artery & Right Common Femoral Vein Access.  PROCEDURE: The patient was brought to the [facility] in the fasting state and prepped and draped in the usual sterile fashion for right radial and/or femoral arterial or radial access. A modified Palmer's test was performed on the right wrist demonstrating excellent collateral flow. Sterile technique was used including antiseptics, cap, gloves, gown, hand hygiene, mask and sheet. Skin prep: Chlorhexidine.  Consent: Risks of procedure as well as the alternatives and risks of each were explained to the (patient/caregiver). Consent for procedure obtained.   Time Out: Verified patient identification, verified  procedure, site/side was marked, verified correct patient position, special equipment/implants available, medications/allergies/relevent history reviewed, required imaging and test results available. Performed.  Access:   Initial attempts were made to exchange the existing 18-gauge right antecubital IV over a wire for a 6 Fr glide sheath were unsuccessful, the decision was made to abort the brachial approach and obtained femoral venous access.  Right Common Femoral Vein: 7 Fr Sheath - Seldinger Technique.  Right Radial Artery: 6 Fr Sheath - modified Seldinger Technique   4500 Units IV Heparin; 10 mL Radial Cocktail  Right Heart Catheterization: 7Fr Swan Ganz catheter advanced under fluoroscopy with balloon inflated to the RA, RV, then PCWP-PA for hemodynamic measurement.   Simultaneous FA & PA blood gases checked for SaO2% to calculate FICK CO/CI   Thermodilution Injections performed to calculate CO/CI   Simultaneous PCWP/LV & RV/LV pressures monitored with Angled Pigtail in LV.   Catheter removed completely out of the body with balloon deflated.    Left Heart Catheterization: 5 Fr Catheters advanced or exchanged over a J-wire; TIG 4.0 catheter advanced first.   LV Hemodynamics: TIG 4.0  Left Coronary Artery Cineangiography: JL43.5Catheter   Right Coronary Artery: JR 4 Catheter  R Femoral Venous Sheath removed in the holding area with manual pressure for hemostasis.  TR Band: 15 mL @ 8676  FINDINGS:  Hemodynamics:  Findings:   SaO2%  Pressures mmHg  Mean P  mmHg  EDP  mmHg   Right Atrium   11/9  8   Right Ventricle   31/1   5   Pulmonary Artery  72  39.6  19    PCWP   17/13  13    Central Aortic  91  131/77  99    Left Ventricle   130/0   6          Cardiac Output:   Cardiac Index:    Fick  6.65   3.26    Thermodilution  3.97   1 .95    Left Ventriculography: Deferred  Coronary  Anatomy: Left Dominant  Left Main: Large-caliber, dominant vessel that bifurcates into the LAD and Circumflex. Angiographically normal. Short vessel LAD: Large caliber vessel that gives off a very large proximal septal perforator trunk and a major diagonal branch. At this branch point is a roughly 40-50% stenosis. The vessel then continues distally to gives off a second diagonal branch. After the diagonal branch the vessel then tapers to a very small-caliber vessel the apex. He barely reaches the apex. After D2 there appears to be a focal lesion although this was not very well visualized. Probably 50-60%.  D1: Moderate caliber proximal vessel it covers the anterolateral wall. Minimal luminal irregularities with several branches.  D2: Moderate caliber vessel, mid LAD, minimal luminal irregularities. At this point the vessel is actually larger than the follow on LAD. Left Circumflex: Very large caliber, dominant vessel. It gives rise to a very small marginal branch that runs like a ramus intermedius with diffuse disease including a proximal 70-80% stenosis. This is not amenable to PCI or CABG. There is an another large lateral OM before the vessel courses into the AV groove giving rise to 2 small posterior lateral branches and the posterior descending artery. Just after the posterior lateral branches going into the PDA there is a focal  irregular 70% stenosis.  OM1: Large-caliber, lateral branch mild luminal irregularities. This vessel courses down to the inferolateral apex.  LPDA: Moderate large-caliber vessel with a proximal disease noted above. Beyond that has mild diffuse luminal irregularities of 10-20%. It reaches almost to the apex.   RCA: Small caliber, nondominant vessel. There is a separate ostium for the SA nodal artery to the main RCA has a early mid 30-40% stenosis that branches into 2 smaller marginal branches.  MEDICATIONS:  Anesthesia: Local Lidocaine 2 mL for radial, 12 ml for  femoral vein  Sedation: 3 mg IV Versed, 75 mcg IV fentanyl ;   Omnipaque Contrast: 80 ml  Anticoagulation: IV Heparin 4500 Units ;  Radial Cocktail: 5 mg Verapamil, 400 mcg NTG, 2 ml 2% Lidocaine in 10 ml NS  PATIENT DISPOSITION:   The patient was transferred to the PACU holding area in a hemodynamicaly stable, chest pain free condition.  The patient tolerated the procedure well, and there were no complications. EBL: < 10 ml  The patient was stable before, during, and after the procedure.  POST-OPERATIVE DIAGNOSIS:   Known moderate stenosis. Gradient estimated of 5.8 mmHg by catheter  Conflicting cardiac outputs between Fick and thermodilution. Likely related to mitral valve disease  Moderate CAD with most significant being at his 70% distal circumflex-proximal LPDA Asian. Also tandem moderate lesions in the LAD of unclear significance, with a small downstream vessel.Marland Kitchen  PLAN OF CARE:  The patient will be admitted for planned inpatient valve surgery  I will review the films with Dr. Roxy Manns to determine whether or not he feels that CABG is warranted.  Continue home medications.   Leonie Man, M.D., M.S. [facility] [address] [place],  [address] [phone]  08/28/2013 3:00 PM    CT ANGIOGRAPHY CHEST, ABDOMEN AND PELVIS  TECHNIQUE: Multidetector CT imaging through the chest, abdomen and pelvis was performed using the standard protocol during bolus administration of intravenous contrast. Multiplanar reconstructed images and MIPs were obtained and reviewed to evaluate the vascular anatomy.  CONTRAST: 57mL OMNIPAQUE IOHEXOL 350 MG/ML SOLN  COMPARISON: 08/25/2013  FINDINGS: CTA CHEST FINDINGS  Vascular: Normal caliber of the thoracic aorta without dissection or intramural hematoma. There are a few coronary artery calcifications. Normal configuration of the great vessels. The great vessels  are patent.  Negative for chest lymphadenopathy. No significant pericardial or pleural fluid. The trachea and mainstem bronchi are patent. There is a new 5 mm nodule in the right lower lobe on sequence 9, image 48. The margins of this nodule are mildly hazy and there appears to be additional punctate densities in this area. Stable 5 mm nodule in the superior segment of the right lower lobe on sequence 9, image 33. Punctate density in the left upper lung on sequence 9, image 17 is unchanged. Few linear densities at the left lung base are most compatible with atelectasis.  No acute bone abnormality.  Review of the MIP images confirms the above findings.  CTA ABDOMEN AND PELVIS FINDINGS  Vascular findings: There is variant celiac anatomy. The splenic artery and left gastric artery originate from the celiac trunk. The common hepatic artery originates from the superior mesenteric artery. The celiac trunk and SMA are widely patent. Single left renal artery without significant atherosclerotic disease or stenosis. Single right renal artery has been ligated just beyond the origin and compatible with a nephrectomy. There is mild atherosclerotic disease in the infrarenal abdominal aorta without aneurysm. Negative for an  aortic dissection. The inferior mesenteric artery is patent. There is atherosclerotic disease in the iliac arteries. Approximately 50% narrowing of the mid right common iliac artery due to calcified and noncalcified plaque and similar to prior examination. Right common iliac artery disease is best seen on sequence 5, image 182. The right internal iliac artery and branches are patent. There is mild atherosclerotic disease in the right external iliac artery. Minimum diameter of the right external artery is roughly 6 mm. The proximal right femoral arteries are patent. Mild atherosclerotic disease in left common iliac artery. There is artifact on sequence 5, image 179 which  limits evaluation for narrowing in the left common iliac artery. Overall, there does not appear to be significant left iliac artery stenosis. The left internal and external iliac arteries are patent. The left external iliac artery is small measuring roughly 5 mm . Proximal left femoral arteries are patent.  Patient has undergone a right nephrectomy. Small lymph nodes in the pericaval stations are similar to the pre-surgical examination. Again noted are at least 3 low-density structures within the left kidney. There is an exophytic 1.1 cm structure with Hounsfield units of approximately 28 on the arterial phase of imaging. This structure roughly measured 1.0 cm on the previous examination. Based on the Hounsfield units, this exophytic low-density structure is indeterminate.  Normal appearance of the adrenal glands, spleen, pancreas, liver and gallbladder. Normal appearance of the prostate and urinary bladder. Again noted are large bilateral inguinal hernias containing fat. Normal appearance of the small and large bowel. No acute bone abnormality. Venous phase images in the pelvis are limited due to excessive motion artifact.  Review of the MIP images confirms the above findings.  IMPRESSION: Negative for aortic dissection or aortic aneurysm. There is atherosclerotic disease in the iliac arteries bilaterally. Small caliber of the external iliac arteries bilaterally. These vascular findings are similar to the previous examination.  Post right nephrectomy for renal cell carcinoma. No evidence for local tumor recurrence. However, the patient has a new 5 mm nodule in the right lower lobe. This new pulmonary nodule has slightly irregular margins with small densities around it. This new nodule could be inflammatory in etiology but metastatic disease cannot be excluded. Recommend a 3-6 month follow-up to evaluate for resolution or progression. There is also a stable 5 mm nodule in  the superior segment of the right lower lobe.  There are at least 3 low-density structures within the left kidney. The largest roughly measures 1.1 cm and the Hounsfield units make this lesion indeterminate. Based on the history of renal cell carcinoma, recommend more complete evaluation of this lesion with a pre and post contrast MRI.  Large bilateral inguinal hernias containing fat.  These results will be called to the ordering clinician or representative by the Radiologist Assistant, and communication documented in the PACS or zVision Dashboard.   Electronically Signed  By: Markus Daft M.D.  On: 04/10/2014 15:27   Impression:  The patient has long-standing rheumatic mitral stenosis with chronic diastolic congestive heart failure and recurrent paroxysmal atrial fibrillation. He has remained clinically stable from a cardiac standpoint since he underwent radical nephrectomy last June and he continues to maintain sinus rhythm on amiodarone. However, he continues to experience shortness of breath and fatigue with any significant physical exertion consistent with chronic diastolic congestive heart failure, New York Heart Association functional class 2-3.   Plan:  The patient and his wife were again counseled at length regarding the indications, risks and potential benefits  of mitral valve replacement. The rationale for elective surgery has been explained, including a comparison between surgery and continued medical therapy with close follow-up. The likelihood of successful and durable valve repair (commissuroplasty) has been discussed with particular reference to the findings of his most recent echocardiogram. We have discussed the option of replacing his valve as a more permanent result, particularly if the valve was replaced using a mechanical prosthesis. We contrasted the advantages of replacing the mitral valve using a mechanical prosthesis with the attendant need for  long-term anticoagulation versus the alternative of replacing it using a bioprosthetic tissue valve with its potential for late structural valve deterioration and failure, depending upon the patient's longevity. The patient specifically requests that his valve be replaced using a mechanical valve. The patient understands and accepts all potential risks of surgery including but not limited to risk of death, stroke or other neurologic complication, myocardial infarction, congestive heart failure, respiratory failure, renal failure, bleeding requiring transfusion and/or reexploration, arrhythmia, infection or other wound complications, pneumonia, pleural and/or pericardial effusion, pulmonary embolus, aortic dissection or other major vascular complication, or delayed complications related to valve repair or replacement including but not limited to structural valve deterioration and failure, thrombosis, embolization, endocarditis, or paravalvular leak. All of their questions have been answered. The patient hopes to proceed with surgery as is practical. We plan to proceed with minimally invasive mitral valve repair or replacement and Maze procedure on Thursday, 04/19/2014.    Valentina Gu. Roxy Manns, MD 04/16/2014 2:09 PM

## 2014-04-16 NOTE — Patient Instructions (Signed)
Patient has been instructed to stop taking coumadin and fish oil, as previously instructed  Patient should continue taking all other medications without change through the day before surgery.  Patient should have nothing to eat or drink after midnight the night before surgery.  On the morning of surgery patient should take only Synthroid (levothryoxine) with a sip of water.

## 2014-04-16 NOTE — Progress Notes (Signed)
Primary - dr. Willette Pa  - Ruthell Rummage brown -patram NP Cardiologist - dr. [name] Cath June 2015, echo 2015 ekg dec 2015

## 2014-04-16 NOTE — Progress Notes (Signed)
VASCULAR LAB PRELIMINARY  PRELIMINARY  PRELIMINARY  PRELIMINARY  Pre-op Cardiac Surgery  Carotid Findings:  Bilateral:  1-39% ICA stenosis.  Vertebral artery flow is antegrade.     Upper Extremity Right Left  Brachial Pressures 142 Triphasic 135 Triphasic  Radial Waveforms Triphasic Triphasic  Ulnar Waveforms Triphasic Triphasic  Palmar Arch (Jaishawn's Test) Normal Normal   Findings:  Palmar arch evaluation - Doppler waveforms remained normal bilaterally with both radial and ulnar compressions.   Accalia Rigdon, RVS 04/16/2014, 3:09 PM

## 2014-04-16 NOTE — Progress Notes (Signed)
Anesthesia Chart Review:  Pt is 59 year old male scheduled for mitral valve replacement, maze procedure and TEE on 04/19/2014 with Dr. Roxy Manns.   PCP is Dr. Bea Graff. Cardiologist is Dr. [name]  PMH includes: CHF, CAD, PAF, mitral stenosis, stroke 1998, hyperlipidemia, hypothyroidism, CKD, renal cell carcinoma, OSA, rheumatic heart disease. Current smoker. BMI 30.5. S/p R nephrectomy 08/2013.   Medications include: coumadin (stopped taking 04/13/2014)  Preoperative labs reviewed.  PT/INR 19.2/1.6.  PTT 42. Will repeat PT/PTT DOS. Cr 1.64 (since nephrectomy, pt typically has Cr around 2.0, consistent with CKD).   Chest x-ray reviewed. No edema or consolidation.   EKG: NSR. Possible Left atrial enlargement. Left axis deviation. RBBB. Appears stable.   TEE 08/16/2013: - Left ventricle: The cavity size was normal. Wall thickness was normal. Systolic function was normal. Wall motion was normal; there were no regional wall motion abnormalities. - Aortic valve: There was mild regurgitation. - Mitral valve: Valve area by pressure half-time: 1.4 cm^2. - Left atrium: The atrium was moderately dilated. No evidence of thrombus in the atrial cavity or appendage. No evidence of thrombus in the atrial cavity or appendage. - Right atrium: No evidence of thrombus in the atrial cavity or appendage.  Impressions:  - Rheumatic mitral valve with moderate leaflet tip thickening and minimal calcifications. Moderate mitral stenosis and trace mitral egurgitation.  Cardiac cath 08/28/2013:  Known moderate stenosis. Gradient estimated of 5.8 mmHg by catheter  Conflicting cardiac outputs between Fick and thermodilution. Likely related to mitral valve disease  Moderate CAD with most significant being at his 70% distal circumflex-proximal LPDA Asian. Also tandem moderate lesions in the LAD of unclear significance, with a small downstream vessel.  If no changes, and if PT/PTT acceptable DOS, I anticipate pt can  proceed with surgery as scheduled.   Willeen Cass, FNP-BC [facility]/Anesthesiology Phone: [phone] 04/16/2014 5:01 PM

## 2014-04-18 ENCOUNTER — Other Ambulatory Visit: Payer: Managed Care, Other (non HMO)

## 2014-04-18 ENCOUNTER — Ambulatory Visit: Payer: Managed Care, Other (non HMO) | Admitting: Cardiology

## 2014-04-18 MED ORDER — PLASMA-LYTE 148 IV SOLN
INTRAVENOUS | Status: DC
  Filled 2014-04-18: qty 2.5

## 2014-04-18 MED ORDER — POTASSIUM CHLORIDE 2 MEQ/ML IV SOLN
80.0000 meq | INTRAVENOUS | Status: DC
  Filled 2014-04-18: qty 40

## 2014-04-18 MED ORDER — SODIUM CHLORIDE 0.9 % IV SOLN
1500.0000 mg | INTRAVENOUS | Status: DC
  Filled 2014-04-18: qty 1500

## 2014-04-18 MED ORDER — SODIUM CHLORIDE 0.9 % IV SOLN
INTRAVENOUS | Status: DC
  Filled 2014-04-18: qty 40

## 2014-04-18 MED ORDER — CEFUROXIME SODIUM 1.5 G IJ SOLR
1.5000 g | INTRAMUSCULAR | Status: DC
Start: 2014-04-19 — End: 2014-04-19
  Filled 2014-04-18: qty 1.5

## 2014-04-18 MED ORDER — EPINEPHRINE HCL 1 MG/ML IJ SOLN
0.0000 ug/min | INTRAVENOUS | Status: DC
  Filled 2014-04-18: qty 4

## 2014-04-18 MED ORDER — DOPAMINE-DEXTROSE 3.2-5 MG/ML-% IV SOLN
0.0000 ug/kg/min | INTRAVENOUS | Status: DC
  Filled 2014-04-18: qty 250

## 2014-04-18 MED ORDER — NITROGLYCERIN IN D5W 200-5 MCG/ML-% IV SOLN
2.0000 ug/min | INTRAVENOUS | Status: DC
  Filled 2014-04-18: qty 250

## 2014-04-18 MED ORDER — PHENYLEPHRINE HCL 10 MG/ML IJ SOLN
30.0000 ug/min | INTRAVENOUS | Status: DC
  Filled 2014-04-18: qty 2

## 2014-04-18 MED ORDER — DEXTROSE 5 % IV SOLN
750.0000 mg | INTRAVENOUS | Status: DC
  Filled 2014-04-18: qty 750

## 2014-04-18 MED ORDER — VANCOMYCIN HCL 1000 MG IV SOLR
INTRAVENOUS | Status: AC
  Administered 2014-04-19: 1000 mL
  Filled 2014-04-18: qty 1000

## 2014-04-18 MED ORDER — DEXMEDETOMIDINE HCL IN NACL 400 MCG/100ML IV SOLN
0.1000 ug/kg/h | INTRAVENOUS | Status: DC
  Filled 2014-04-18: qty 100

## 2014-04-18 MED ORDER — GLUTARALDEHYDE 0.625% SOAKING SOLUTION
TOPICAL | Status: DC | PRN
  Filled 2014-04-18: qty 50

## 2014-04-18 MED ORDER — SODIUM CHLORIDE 0.9 % IV SOLN
INTRAVENOUS | Status: DC
  Filled 2014-04-18: qty 30

## 2014-04-18 MED ORDER — SODIUM CHLORIDE 0.9 % IV SOLN
INTRAVENOUS | Status: DC
  Filled 2014-04-18: qty 2.5

## 2014-04-18 MED ORDER — MAGNESIUM SULFATE 50 % IJ SOLN
40.0000 meq | INTRAMUSCULAR | Status: DC
  Filled 2014-04-18: qty 10

## 2014-04-19 ENCOUNTER — Encounter (HOSPITAL_COMMUNITY)
Admission: RE | Disposition: A | Discharge status: Home / Self Care | Payer: Self-pay | Source: Ambulatory Visit | Attending: Thoracic Surgery (Cardiothoracic Vascular Surgery)

## 2014-04-19 ENCOUNTER — Encounter (HOSPITAL_COMMUNITY): Payer: Self-pay | Admitting: *Deleted

## 2014-04-19 ENCOUNTER — Ambulatory Visit (HOSPITAL_COMMUNITY)
Admission: RE | Admit: 2014-04-19 | Discharge: 2014-04-19 | Disposition: A | Discharge status: Home / Self Care | Payer: Managed Care, Other (non HMO) | Source: Ambulatory Visit | Attending: Thoracic Surgery (Cardiothoracic Vascular Surgery) | Admitting: Thoracic Surgery (Cardiothoracic Vascular Surgery)

## 2014-04-19 DIAGNOSIS — G4733 Obstructive sleep apnea (adult) (pediatric): Secondary | ICD-10-CM | POA: Diagnosis not present

## 2014-04-19 DIAGNOSIS — Z6830 Body mass index (BMI) 30.0-30.9, adult: Secondary | ICD-10-CM | POA: Insufficient documentation

## 2014-04-19 DIAGNOSIS — E669 Obesity, unspecified: Secondary | ICD-10-CM | POA: Insufficient documentation

## 2014-04-19 DIAGNOSIS — E785 Hyperlipidemia, unspecified: Secondary | ICD-10-CM | POA: Insufficient documentation

## 2014-04-19 DIAGNOSIS — F17210 Nicotine dependence, cigarettes, uncomplicated: Secondary | ICD-10-CM | POA: Insufficient documentation

## 2014-04-19 DIAGNOSIS — F329 Major depressive disorder, single episode, unspecified: Secondary | ICD-10-CM | POA: Diagnosis not present

## 2014-04-19 DIAGNOSIS — L309 Dermatitis, unspecified: Secondary | ICD-10-CM | POA: Insufficient documentation

## 2014-04-19 DIAGNOSIS — Z888 Allergy status to other drugs, medicaments and biological substances status: Secondary | ICD-10-CM | POA: Insufficient documentation

## 2014-04-19 DIAGNOSIS — I129 Hypertensive chronic kidney disease with stage 1 through stage 4 chronic kidney disease, or unspecified chronic kidney disease: Secondary | ICD-10-CM | POA: Diagnosis not present

## 2014-04-19 DIAGNOSIS — I2510 Atherosclerotic heart disease of native coronary artery without angina pectoris: Secondary | ICD-10-CM | POA: Insufficient documentation

## 2014-04-19 DIAGNOSIS — Z538 Procedure and treatment not carried out for other reasons: Secondary | ICD-10-CM | POA: Diagnosis not present

## 2014-04-19 DIAGNOSIS — Z7901 Long term (current) use of anticoagulants: Secondary | ICD-10-CM | POA: Diagnosis not present

## 2014-04-19 DIAGNOSIS — F419 Anxiety disorder, unspecified: Secondary | ICD-10-CM | POA: Diagnosis not present

## 2014-04-19 DIAGNOSIS — I5032 Chronic diastolic (congestive) heart failure: Secondary | ICD-10-CM | POA: Insufficient documentation

## 2014-04-19 DIAGNOSIS — N182 Chronic kidney disease, stage 2 (mild): Secondary | ICD-10-CM | POA: Insufficient documentation

## 2014-04-19 DIAGNOSIS — I050 Rheumatic mitral stenosis: Secondary | ICD-10-CM | POA: Diagnosis present

## 2014-04-19 DIAGNOSIS — E039 Hypothyroidism, unspecified: Secondary | ICD-10-CM | POA: Insufficient documentation

## 2014-04-19 DIAGNOSIS — B372 Candidiasis of skin and nail: Secondary | ICD-10-CM | POA: Diagnosis present

## 2014-04-19 DIAGNOSIS — Z85528 Personal history of other malignant neoplasm of kidney: Secondary | ICD-10-CM | POA: Diagnosis not present

## 2014-04-19 DIAGNOSIS — I480 Paroxysmal atrial fibrillation: Secondary | ICD-10-CM | POA: Diagnosis not present

## 2014-04-19 DIAGNOSIS — I4891 Unspecified atrial fibrillation: Secondary | ICD-10-CM | POA: Diagnosis present

## 2014-04-19 LAB — PROTIME-INR
INR: 1.03 (ref 0.00–1.49)
Prothrombin Time: 13.6 seconds (ref 11.6–15.2)

## 2014-04-19 LAB — APTT: aPTT: 35 seconds (ref 24–37)

## 2014-04-19 SURGERY — REPLACEMENT, MITRAL VALVE, MINIMALLY INVASIVE
Anesthesia: General

## 2014-04-19 MED ORDER — FENTANYL CITRATE 0.05 MG/ML IJ SOLN
INTRAMUSCULAR | Status: AC
  Filled 2014-04-19: qty 5

## 2014-04-19 MED ORDER — PROTAMINE SULFATE 10 MG/ML IV SOLN
INTRAVENOUS | Status: AC
  Filled 2014-04-19: qty 25

## 2014-04-19 MED ORDER — PROPOFOL 10 MG/ML IV BOLUS
INTRAVENOUS | Status: AC
  Filled 2014-04-19: qty 20

## 2014-04-19 MED ORDER — HEPARIN SODIUM (PORCINE) 1000 UNIT/ML IJ SOLN
INTRAMUSCULAR | Status: AC
  Filled 2014-04-19: qty 1

## 2014-04-19 MED ORDER — LIDOCAINE HCL (CARDIAC) 20 MG/ML IV SOLN
INTRAVENOUS | Status: AC
  Filled 2014-04-19: qty 5

## 2014-04-19 MED ORDER — ROCURONIUM BROMIDE 50 MG/5ML IV SOLN
INTRAVENOUS | Status: AC
Start: 2014-04-19 — End: 2014-04-19
  Filled 2014-04-19: qty 1

## 2014-04-19 MED ORDER — NYSTATIN-TRIAMCINOLONE 100000-0.1 UNIT/GM-% EX CREA: 1.0000 "application " | TOPICAL_CREAM | Freq: Two times a day (BID) | CUTANEOUS | Status: DC

## 2014-04-19 MED ORDER — METOPROLOL TARTRATE 12.5 MG HALF TABLET
ORAL_TABLET | ORAL | Status: AC
  Filled 2014-04-19: qty 1

## 2014-04-19 MED ORDER — VECURONIUM BROMIDE 10 MG IV SOLR
INTRAVENOUS | Status: AC
  Filled 2014-04-19: qty 10

## 2014-04-19 MED ORDER — METOPROLOL TARTRATE 12.5 MG HALF TABLET: 12.5000 mg | ORAL_TABLET | Freq: Once | ORAL | Status: DC

## 2014-04-19 MED ORDER — 0.9 % SODIUM CHLORIDE (POUR BTL) OPTIME
TOPICAL | Status: DC | PRN
  Administered 2014-04-19: 5000 mL

## 2014-04-19 MED ORDER — ARTIFICIAL TEARS OP OINT
TOPICAL_OINTMENT | OPHTHALMIC | Status: AC
  Filled 2014-04-19: qty 3.5

## 2014-04-19 MED ORDER — CHLORHEXIDINE GLUCONATE 4 % EX LIQD: 30.0000 mL | CUTANEOUS | Status: DC

## 2014-04-19 MED ORDER — MIDAZOLAM HCL 10 MG/2ML IJ SOLN
INTRAMUSCULAR | Status: AC
  Filled 2014-04-19: qty 2

## 2014-04-19 SURGICAL SUPPLY — 97 items
ADAPTER CARDIO PERF ANTE/RETRO (ADAPTER) ×8 IMPLANT
ADH SKN CLS APL DERMABOND .7 (GAUZE/BANDAGES/DRESSINGS) ×8
ADPR PRFSN 84XANTGRD RTRGD (ADAPTER) ×4
APL SKNCLS STERI-STRIP NONHPOA (GAUZE/BANDAGES/DRESSINGS) ×4
ATTRACTOMAT 16X20 MAGNETIC DRP (DRAPES) ×4 IMPLANT
BAG DECANTER FOR FLEXI CONT (MISCELLANEOUS) ×8 IMPLANT
BENZOIN TINCTURE PRP APPL 2/3 (GAUZE/BANDAGES/DRESSINGS) ×8 IMPLANT
BLADE SURG 11 STRL SS (BLADE) ×8 IMPLANT
CANISTER SUCTION 2500CC (MISCELLANEOUS) ×16 IMPLANT
CANNULA FEM VENOUS REMOTE 22FR (CANNULA) IMPLANT
CANNULA FEMORAL ART 14 SM (MISCELLANEOUS) ×8 IMPLANT
CANNULA GUNDRY RCSP 15FR (MISCELLANEOUS) ×8 IMPLANT
CANNULA OPTISITE PERFUSION 16F (CANNULA) IMPLANT
CANNULA OPTISITE PERFUSION 18F (CANNULA) IMPLANT
CARDIOBLATE CARDIAC ABLATION (MISCELLANEOUS)
CONN ST 1/4X3/8  BEN (MISCELLANEOUS) ×4
CONN ST 1/4X3/8 BEN (MISCELLANEOUS) ×12 IMPLANT
CONT SPEC STER OR (MISCELLANEOUS) ×4 IMPLANT
COVER BACK TABLE 24X17X13 BIG (DRAPES) ×4 IMPLANT
COVER MAYO STAND STRL (DRAPES) ×8 IMPLANT
COVER SURGICAL LIGHT HANDLE (MISCELLANEOUS) ×8 IMPLANT
CRADLE DONUT ADULT HEAD (MISCELLANEOUS) ×8 IMPLANT
DERMABOND ADVANCED (GAUZE/BANDAGES/DRESSINGS) ×4
DERMABOND ADVANCED .7 DNX12 (GAUZE/BANDAGES/DRESSINGS) ×12 IMPLANT
DEVICE CARDIOBLATE CARDIAC ABL (MISCELLANEOUS) IMPLANT
DEVICE TROCAR PUNCTURE CLOSURE (ENDOMECHANICALS) ×8 IMPLANT
DRAIN CHANNEL 28F RND 3/8 FF (WOUND CARE) ×16 IMPLANT
DRAPE BILATERAL SPLIT (DRAPES) ×8 IMPLANT
DRAPE C-ARM 42X72 X-RAY (DRAPES) ×8 IMPLANT
DRAPE CV SPLIT W-CLR ANES SCRN (DRAPES) ×8 IMPLANT
DRAPE INCISE IOBAN 66X45 STRL (DRAPES) ×16 IMPLANT
DRAPE SLUSH/WARMER DISC (DRAPES) ×8 IMPLANT
DRSG COVADERM 4X8 (GAUZE/BANDAGES/DRESSINGS) ×5 IMPLANT
ELECT BLADE 6.5 EXT (BLADE) ×8 IMPLANT
ELECT REM PT RETURN 9FT ADLT (ELECTROSURGICAL) ×12
ELECTRODE REM PT RTRN 9FT ADLT (ELECTROSURGICAL) ×12 IMPLANT
FEMORAL VENOUS CANN RAP (CANNULA) IMPLANT
FLUID NSS /IRRIG 3000 ML XXX (IV SOLUTION) ×3 IMPLANT
GAUZE SPONGE 4X4 12PLY STRL (GAUZE/BANDAGES/DRESSINGS) ×8 IMPLANT
GLOVE BIOGEL PI IND STRL 6 (GLOVE) ×12 IMPLANT
GLOVE BIOGEL PI INDICATOR 6 (GLOVE) ×6
GLOVE ORTHO TXT STRL SZ7.5 (GLOVE) ×24 IMPLANT
GOWN STRL REUS W/ TWL LRG LVL3 (GOWN DISPOSABLE) ×24 IMPLANT
GOWN STRL REUS W/TWL LRG LVL3 (GOWN DISPOSABLE) ×24
GUIDEWIRE ANG ZIPWIRE 038X150 (WIRE) ×8 IMPLANT
INSERT CONFORM CROSS CLAMP 66M (MISCELLANEOUS) ×4 IMPLANT
INSERT CONFORM CROSS CLAMP 86M (MISCELLANEOUS) ×4 IMPLANT
KIT BASIN OR (CUSTOM PROCEDURE TRAY) ×8 IMPLANT
KIT DEVICE SUT COR-KNOT MIS 5 (INSTRUMENTS) ×4 IMPLANT
KIT DILATOR VASC 18G NDL (KITS) ×8 IMPLANT
KIT ROOM TURNOVER OR (KITS) ×8 IMPLANT
KIT SUCTION CATH 14FR (SUCTIONS) ×8 IMPLANT
LEAD PACING MYOCARDI (MISCELLANEOUS) ×8 IMPLANT
NDL AORTIC ROOT 14G 7F (CATHETERS) ×2 IMPLANT
NEEDLE AORTIC ROOT 14G 7F (CATHETERS) ×6 IMPLANT
NS IRRIG 1000ML POUR BTL (IV SOLUTION) ×40 IMPLANT
PACK OPEN HEART (CUSTOM PROCEDURE TRAY) ×8 IMPLANT
PAD ARMBOARD 7.5X6 YLW CONV (MISCELLANEOUS) ×16 IMPLANT
PAD ELECT DEFIB RADIOL ZOLL (MISCELLANEOUS) ×8 IMPLANT
PROBE CRYO2-ABLATION MALLABLE (MISCELLANEOUS) IMPLANT
RETRACTOR TRL SOFT TISSUE LG (INSTRUMENTS) IMPLANT
RETRACTOR TRM SOFT TISSUE 7.5 (INSTRUMENTS) IMPLANT
SET CANNULATION TOURNIQUET (MISCELLANEOUS) ×8 IMPLANT
SET IRRIG TUBING LAPAROSCOPIC (IRRIGATION / IRRIGATOR) ×8 IMPLANT
SOLUTION ANTI FOG 6CC (MISCELLANEOUS) ×8 IMPLANT
SPONGE GAUZE 4X4 12PLY STER LF (GAUZE/BANDAGES/DRESSINGS) ×3 IMPLANT
SUCKER WEIGHTED FLEX (MISCELLANEOUS) ×16 IMPLANT
SUT BONE WAX W31G (SUTURE) ×8 IMPLANT
SUT E-PACK MINIMALLY INVASIVE (SUTURE) ×8 IMPLANT
SUT ETHIBOND (SUTURE) IMPLANT
SUT ETHIBOND 2 0 SH (SUTURE) IMPLANT
SUT ETHIBOND 2 0 V4 (SUTURE) IMPLANT
SUT ETHIBOND 2 0V4 GREEN (SUTURE) IMPLANT
SUT ETHIBOND 2-0 RB-1 WHT (SUTURE) IMPLANT
SUT ETHIBOND 4 0 TF (SUTURE) IMPLANT
SUT ETHIBOND 5 0 C 1 30 (SUTURE) IMPLANT
SUT ETHIBOND NAB MH 2-0 36IN (SUTURE) IMPLANT
SUT ETHIBOND X763 2 0 SH 1 (SUTURE) ×8 IMPLANT
SUT GORETEX 6.0 TH-9 30 IN (SUTURE) IMPLANT
SUT GORETEX CV 4 TH 22 36 (SUTURE) ×8 IMPLANT
SUT GORETEX CV-5THC-13 36IN (SUTURE) IMPLANT
SUT GORETEX CV4 TH-18 (SUTURE) ×16 IMPLANT
SUT GORETEX TH-18 36 INCH (SUTURE) IMPLANT
SYRINGE 10CC LL (SYRINGE) ×8 IMPLANT
SYS ATRICLIP LAA EXCLUSION 45 (CLIP) IMPLANT
SYSTEM SAHARA CHEST DRAIN ATS (WOUND CARE) ×16 IMPLANT
TAPE CLOTH SURG 4X10 WHT LF (GAUZE/BANDAGES/DRESSINGS) ×3 IMPLANT
TAPE PAPER 2X10 WHT MICROPORE (GAUZE/BANDAGES/DRESSINGS) ×3 IMPLANT
TOWEL OR 17X24 6PK STRL BLUE (TOWEL DISPOSABLE) ×12 IMPLANT
TOWEL OR 17X26 10 PK STRL BLUE (TOWEL DISPOSABLE) ×12 IMPLANT
TRAY FOLEY IC TEMP SENS 16FR (CATHETERS) ×8 IMPLANT
TROCAR XCEL BLADELESS 5X75MML (TROCAR) ×8 IMPLANT
TROCAR XCEL NON-BLD 11X100MML (ENDOMECHANICALS) ×16 IMPLANT
TUNNELER SHEATH ON-Q 11GX8 DSP (PAIN MANAGEMENT) IMPLANT
UNDERPAD 30X30 INCONTINENT (UNDERPADS AND DIAPERS) ×8 IMPLANT
WATER STERILE IRR 1000ML POUR (IV SOLUTION) ×16 IMPLANT
WIRE BENTSON .035X145CM (WIRE) ×8 IMPLANT

## 2014-04-19 NOTE — Discharge Instructions (Signed)
Resume all previous medications at previous dosage including Coumadin (warfarin)  Begin topical Nystatin treatment - apply to both groins twice daily.  Wash using antibacterial soap twice daily prior to application of Nystatin and keep the area clean and dry.

## 2014-04-19 NOTE — Progress Notes (Signed)
Patient has bilateral groin irritation--?yeast infections.  He states that he's had it off and on for the last 3 weeks.  Have paged Dr. Roxy Manns to make him aware and for any new orders.  DA

## 2014-04-19 NOTE — Anesthesia Preprocedure Evaluation (Addendum)
Anesthesia Evaluation    Airway        Dental   Pulmonary sleep apnea and Continuous Positive Airway Pressure Ventilation , Current Smoker,          Cardiovascular hypertension, + CAD (cath: 70% Cx) and +CHF + dysrhythmias Atrial Fibrillation  '15 ECHO: normal LVF, mitral valve anterior leaflet has typical hockey stick appearance and has moderately restricted mobility. Anterior leaflet is doming into the left atrium in systole. The findings are consistent with moderate stenosis. Mean transmitral gradient is 7 mmHg. Valve area by pressure halftime: 1.4 cm2. Peak trans mitral gradient 15 mmHg, mean gradient 7 mmHg. Mobility is restricted predominantly of the anterior leaflet. Diastolic leaflet doming was present.    Neuro/Psych    GI/Hepatic   Endo/Other  Hypothyroidism   Renal/GU Renal InsufficiencyRenal disease (creat 1.64)H/o renal cell ca: s/p nephrectomy     Musculoskeletal   Abdominal   Peds  Hematology   Anesthesia Other Findings   Reproductive/Obstetrics                            Anesthesia Physical Anesthesia Plan Anesthesia Quick Evaluation

## 2014-04-19 NOTE — Discharge Summary (Signed)
Physician Discharge Summary  Patient ID: Kevin English MRN: [id number] DOB/AGE: 1955/12/21 59 y.o.  Admit date: 04/19/2014 Discharge date: 04/19/2014  Admission Diagnoses:  Discharge Diagnoses:  Principal Problem:   Mitral stenosis Active Problems:   Atrial fibrillation   Yeast dermatitis   CHF (congestive heart failure)   Discharged Condition: good  Hospital Course:   Patient presented for elective minimally invasive mitral valve replacement and maze procedure.  During his preoperative assessment on the morning of surgery he reported a 2 week history of burning rash in both groins.  On examination the patient has severe dermatitis in both groins consistent with active yeast infection.  Options were discussed with the patient and his wife in pre-op holding.  Surgery was postponed.   Consults: None  Significant Diagnostic Studies: none  Treatments: none  Discharge Exam: Blood pressure 146/84, pulse 61, temperature 98.7 F (37.1 C), temperature source Oral, resp. rate 16, height 5\' 8"  (1.727 m), weight 91.173 kg (201 lb), SpO2 98 %. severe erythematous rash both groins  Disposition: 01-Home or Self Care     Medication List    STOP taking these medications        pseudoephedrine-acetaminophen 30-500 MG Tabs  Commonly known as:  TYLENOL SINUS      TAKE these medications        acetaminophen 325 MG tablet  Commonly known as:  TYLENOL  Take 650 mg by mouth every 6 (six) hours as needed (pain).     ALPRAZolam 0.5 MG tablet  Commonly known as:  XANAX  Take 0.5-1 mg by mouth at bedtime as needed for anxiety.     amiodarone 200 MG tablet  Commonly known as:  PACERONE  Take 1 tablet (200 mg total) by mouth every morning.     amLODipine 10 MG tablet  Commonly known as:  NORVASC  Take 1 tablet (10 mg total) by mouth 2 (two) times daily.     levothyroxine 75 MCG tablet  Commonly known as:  SYNTHROID  Take 1 tablet (75 mcg total) by mouth daily before breakfast.     nystatin-triamcinolone cream  Commonly known as:  MYCOLOG II  Apply 1 application topically 2 (two) times daily.     sertraline 50 MG tablet  Commonly known as:  ZOLOFT  Take 1 tablet (50 mg total) by mouth daily.     warfarin 5 MG tablet  Commonly known as:  COUMADIN  Take 2.5-5 mg by mouth daily. Sun Tues Thurs 2.5 mg. , and Mon Wed Fri and Sat 5 mg           Follow-up Information    Follow up with Rexene Alberts, MD In 2 weeks.   Specialty:  Cardiothoracic Surgery   Why:  follow up yeast infection and reschedule surgery   Contact information:   [address] [place] [address] [phone]       Signed: Rexene Alberts 04/19/2014, 7:54 AM

## 2014-04-23 ENCOUNTER — Telehealth: Payer: Self-pay | Admitting: Cardiology

## 2014-04-23 NOTE — Telephone Encounter (Signed)
I will forward to CVRR to followup with patient.

## 2014-04-23 NOTE — Telephone Encounter (Signed)
New Message  Pt origionally sched for 1/28 posponed to feb 15, has been off coum, went back on 1/28 after being off since 1/22. Pt wife calling to see if he needs coum check. Please call back and discuss. She said to call home first then this #- [phone].

## 2014-04-24 NOTE — Telephone Encounter (Signed)
Returned call to pt's wife, pt resumed Coumadin 04/19/14, surgery postponed last week appt with Dr Roxy Manns on 05/07/14.  Advised pt will need recheck in 1 week after restart.  Made appt in clinic for 04/26/14.

## 2014-04-26 ENCOUNTER — Ambulatory Visit (INDEPENDENT_AMBULATORY_CARE_PROVIDER_SITE_OTHER): Payer: Managed Care, Other (non HMO) | Admitting: *Deleted

## 2014-04-26 DIAGNOSIS — I4891 Unspecified atrial fibrillation: Secondary | ICD-10-CM

## 2014-04-26 DIAGNOSIS — Z5181 Encounter for therapeutic drug level monitoring: Secondary | ICD-10-CM

## 2014-04-26 DIAGNOSIS — I5033 Acute on chronic diastolic (congestive) heart failure: Secondary | ICD-10-CM

## 2014-04-26 LAB — POCT INR: INR: 1.6

## 2014-05-07 ENCOUNTER — Ambulatory Visit (INDEPENDENT_AMBULATORY_CARE_PROVIDER_SITE_OTHER): Payer: Managed Care, Other (non HMO) | Admitting: Thoracic Surgery (Cardiothoracic Vascular Surgery)

## 2014-05-07 ENCOUNTER — Other Ambulatory Visit: Payer: Self-pay | Admitting: *Deleted

## 2014-05-07 ENCOUNTER — Ambulatory Visit (INDEPENDENT_AMBULATORY_CARE_PROVIDER_SITE_OTHER): Payer: Managed Care, Other (non HMO)

## 2014-05-07 ENCOUNTER — Encounter: Payer: Self-pay | Admitting: Thoracic Surgery (Cardiothoracic Vascular Surgery)

## 2014-05-07 VITALS — BP 126/76 | HR 68 | Resp 20 | Ht 68.0 in

## 2014-05-07 DIAGNOSIS — I099 Rheumatic heart disease, unspecified: Secondary | ICD-10-CM

## 2014-05-07 DIAGNOSIS — Z5181 Encounter for therapeutic drug level monitoring: Secondary | ICD-10-CM

## 2014-05-07 DIAGNOSIS — I4891 Unspecified atrial fibrillation: Secondary | ICD-10-CM

## 2014-05-07 DIAGNOSIS — I5033 Acute on chronic diastolic (congestive) heart failure: Secondary | ICD-10-CM

## 2014-05-07 DIAGNOSIS — I050 Rheumatic mitral stenosis: Secondary | ICD-10-CM

## 2014-05-07 DIAGNOSIS — B372 Candidiasis of skin and nail: Secondary | ICD-10-CM

## 2014-05-07 LAB — POCT INR: INR: 2.6

## 2014-05-07 MED ORDER — NYSTATIN-TRIAMCINOLONE 100000-0.1 UNIT/GM-% EX CREA: 1.0000 "application " | TOPICAL_CREAM | Freq: Two times a day (BID) | CUTANEOUS | Status: DC

## 2014-05-07 NOTE — Progress Notes (Signed)
      [address]       [place] [address]             [phone]     CARDIOTHORACIC SURGERY OFFICE NOTE  Referring Provider is Dorothy Spark, MD PCP is Gilford Rile, MD   HPI:  Patient returns for followup of rheumatic mitral valve disease with stage D severe symptomatic mitral stenosis associated with chronic diastolic congestive heart failure and recurrent paroxysmal atrial fibrillation. The patient was recently scheduled for minimally invasive mitral valve replacement and Maze procedure on 04/19/2014. However, when the patient presented for surgery he was found to have a new severe rash in both groins related to cutaneous yeast infection. Surgery was postponed and the patient was given a prescription for topical antifungal ointment. He returns to the office today for follow-up. The patient states that the rash resolved quickly with the topical cream and he otherwise feels well. He hopes to reschedule surgery as soon as possible.   Current Outpatient Prescriptions  Medication Sig Dispense Refill  . acetaminophen (TYLENOL) 325 MG tablet Take 650 mg by mouth every 6 (six) hours as needed (pain).    Marland Kitchen ALPRAZolam (XANAX) 0.5 MG tablet Take 0.5-1 mg by mouth at bedtime as needed for anxiety.     Marland Kitchen amiodarone (PACERONE) 200 MG tablet Take 1 tablet (200 mg total) by mouth every morning. 30 tablet 6  . amLODipine (NORVASC) 10 MG tablet Take 1 tablet (10 mg total) by mouth 2 (two) times daily. 90 tablet 3  . levothyroxine (SYNTHROID) 75 MCG tablet Take 1 tablet (75 mcg total) by mouth daily before breakfast. 90 tablet 3  . sertraline (ZOLOFT) 50 MG tablet Take 1 tablet (50 mg total) by mouth daily. 90 tablet 4  . warfarin (COUMADIN) 5 MG tablet Take 2.5-5 mg by mouth daily. Sun Tues Thurs 2.5 mg. , and Mon Wed Fri and Sat 5 mg    . nystatin-triamcinolone (MYCOLOG II) cream Apply 1 application topically 2 (two) times daily. 30 g 0   No current facility-administered  medications for this visit.      Physical Exam:   BP 126/76 mmHg  Pulse 68  Resp 20  Ht 5\' 8"  (1.727 m)  SpO2 98%  General:  Well-appearing  Chest:   Clear to auscultation  CV:   Regular rate and rhythm  Incisions:  n/a  Abdomen:  Soft and nontender  Extremities:  Warm and well-perfused  Skin:   The obvious severe cellulitis in both groins has resolved. The patient still has discolored skin but there is no sign of active infection at this time. The patient has large bilateral inguinal hernias that are chronic and likely contribute to a propensity to develop yeast infection.  Diagnostic Tests:  n/a   Impression:  The patient's yeast infection appears to have resolved. He otherwise appears clinically stable.  Plan:  We plan to proceed with surgery on Tuesday 05/15/2014. The patient has been instructed to stop taking Coumadin after he takes his dose on Wednesday, 05/09/2014. He will otherwise remain on all other medications without change. We have again reviewed the indications, risks, potential benefits of surgery. All of his questions been addressed.  I spent in excess of 15 minutes during the conduct of this office consultation and >50% of this time involved direct face-to-face encounter with the patient for counseling and/or coordination of their care.   Valentina Gu. Roxy Manns, MD 05/07/2014 2:12 PM

## 2014-05-07 NOTE — Patient Instructions (Signed)
Patient has been instructed to stop taking coumadin (warfarin) after you take your dose on Wednesday 2/17  Patient should continue taking all other medications without change through the day before surgery.  Patient should have nothing to eat or drink after midnight the night before surgery.  On the morning of surgery patient should take only levothyroxine (Synthroid) with a sip of water.

## 2014-05-08 ENCOUNTER — Other Ambulatory Visit: Payer: Self-pay | Admitting: Thoracic Surgery (Cardiothoracic Vascular Surgery)

## 2014-05-08 ENCOUNTER — Encounter (HOSPITAL_COMMUNITY): Payer: Self-pay | Admitting: Pharmacy Technician

## 2014-05-11 ENCOUNTER — Encounter (HOSPITAL_COMMUNITY)
Admission: RE | Admit: 2014-05-11 | Discharge: 2014-05-11 | Disposition: A | Discharge status: Home / Self Care | Payer: Managed Care, Other (non HMO) | Source: Ambulatory Visit | Attending: Thoracic Surgery (Cardiothoracic Vascular Surgery) | Admitting: Thoracic Surgery (Cardiothoracic Vascular Surgery)

## 2014-05-11 ENCOUNTER — Encounter (HOSPITAL_COMMUNITY): Payer: Self-pay

## 2014-05-11 VITALS — BP 145/75 | HR 68 | Temp 98.6°F | Resp 20 | Ht 68.0 in | Wt 202.2 lb

## 2014-05-11 DIAGNOSIS — I050 Rheumatic mitral stenosis: Secondary | ICD-10-CM

## 2014-05-11 LAB — BLOOD GAS, ARTERIAL
Acid-base deficit: 3.8 mmol/L — ABNORMAL HIGH (ref 0.0–2.0)
Bicarbonate: 20.1 mEq/L (ref 20.0–24.0)
DRAWN BY: 42180
FIO2: 0.21 %
O2 SAT: 95.8 %
Patient temperature: 98.6
TCO2: 21.1 mmol/L (ref 0–100)
pCO2 arterial: 32.7 mmHg — ABNORMAL LOW (ref 35.0–45.0)
pH, Arterial: 7.406 (ref 7.350–7.450)
pO2, Arterial: 81 mmHg (ref 80.0–100.0)

## 2014-05-11 LAB — COMPREHENSIVE METABOLIC PANEL
ALK PHOS: 114 U/L (ref 39–117)
ALT: 36 U/L (ref 0–53)
AST: 22 U/L (ref 0–37)
Albumin: 3.8 g/dL (ref 3.5–5.2)
Anion gap: 10 (ref 5–15)
BUN: 17 mg/dL (ref 6–23)
CHLORIDE: 106 mmol/L (ref 96–112)
CO2: 18 mmol/L — ABNORMAL LOW (ref 19–32)
Calcium: 9.7 mg/dL (ref 8.4–10.5)
Creatinine, Ser: 2.04 mg/dL — ABNORMAL HIGH (ref 0.50–1.35)
GFR calc non Af Amer: 34 mL/min — ABNORMAL LOW (ref >90)
GFR, EST AFRICAN AMERICAN: 39 mL/min — AB (ref >90)
Glucose, Bld: 234 mg/dL — ABNORMAL HIGH (ref 70–99)
POTASSIUM: 4.1 mmol/L (ref 3.5–5.1)
SODIUM: 134 mmol/L — AB (ref 135–145)
TOTAL PROTEIN: 6.6 g/dL (ref 6.0–8.3)
Total Bilirubin: 0.5 mg/dL (ref 0.3–1.2)

## 2014-05-11 LAB — URINALYSIS, ROUTINE W REFLEX MICROSCOPIC
Glucose, UA: 250 mg/dL — AB
Hgb urine dipstick: NEGATIVE
KETONES UR: NEGATIVE mg/dL
LEUKOCYTES UA: NEGATIVE
Nitrite: NEGATIVE
PH: 5 (ref 5.0–8.0)
Protein, ur: 30 mg/dL — AB
SPECIFIC GRAVITY, URINE: 1.022 (ref 1.005–1.030)
Urobilinogen, UA: 0.2 mg/dL (ref 0.0–1.0)

## 2014-05-11 LAB — CBC
HCT: 44.2 % (ref 39.0–52.0)
Hemoglobin: 15.4 g/dL (ref 13.0–17.0)
MCH: 31.6 pg (ref 26.0–34.0)
MCHC: 34.8 g/dL (ref 30.0–36.0)
MCV: 90.6 fL (ref 78.0–100.0)
PLATELETS: 225 10*3/uL (ref 150–400)
RBC: 4.88 MIL/uL (ref 4.22–5.81)
RDW: 13.9 % (ref 11.5–15.5)
WBC: 13 10*3/uL — AB (ref 4.0–10.5)

## 2014-05-11 LAB — PROTIME-INR
INR: 1.85 — ABNORMAL HIGH (ref 0.00–1.49)
Prothrombin Time: 21.5 seconds — ABNORMAL HIGH (ref 11.6–15.2)

## 2014-05-11 LAB — URINE MICROSCOPIC-ADD ON

## 2014-05-11 LAB — APTT: APTT: 48 s — AB (ref 24–37)

## 2014-05-11 LAB — TYPE AND SCREEN
ABO/RH(D): O NEG
Antibody Screen: NEGATIVE

## 2014-05-11 MED ORDER — CHLORHEXIDINE GLUCONATE 4 % EX LIQD: 30.0000 mL | CUTANEOUS | Status: DC

## 2014-05-11 NOTE — Progress Notes (Addendum)
EKG in epic from 04-16-14  Echo reports in epic from 2011/2015  Heart cath in epic from 2015  [place] is Dr.Greg Bea Graff  Cardiologist is Dr.  Knute Neu and Doppler's in epic from 04-16-14

## 2014-05-11 NOTE — Pre-Procedure Instructions (Signed)
Kevin English  05/11/2014   Your procedure is scheduled on:  Tues, Feb 23 @ 7:30 AM  Report to [facility]  at 5:30 AM.  Call this number if you have problems the morning of surgery: [phone]   Remember:   Do not eat food or drink liquids after midnight.   Take these medicines the morning of surgery with A SIP OF WATER: Amlodipine(Norvasc),Amiodarone(Pacerone),Synthroid(Levothyroxine),and Zoloft(Sertraline)              Stop taking your Coumadin as you have been instructed. No Goody's,BC's,Aleve,Aspirin,Ibuprofen,Fish Oil,or any Herbal Medications.   Do not wear jewelry  Do not wear lotions, powders, or colognes.   Men may shave face and neck.  Do not bring valuables to the hospital.  [facility] is not responsible                  for any belongings or valuables.               Contacts, dentures or bridgework may not be worn into surgery.  Leave suitcase in the car. After surgery it may be brought to your room.  For patients admitted to the hospital, discharge time is determined by your                treatment team.                 Special Instructions:  [place] - Preparing for Surgery  Before surgery, you can play an important role.  Because skin is not sterile, your skin needs to be as free of germs as possible.  You can reduce the number of germs on you skin by washing with CHG (chlorahexidine gluconate) soap before surgery.  CHG is an antiseptic cleaner which kills germs and bonds with the skin to continue killing germs even after washing.  Please DO NOT use if you have an allergy to CHG or antibacterial soaps.  If your skin becomes reddened/irritated stop using the CHG and inform your nurse when you arrive at Short Stay.  Do not shave (including legs and underarms) for at least 48 hours prior to the first CHG shower.  You may shave your face.  Please follow these instructions carefully:   1.  Shower with CHG Soap the night before surgery and the                                 morning of Surgery.  2.  If you choose to wash your hair, wash your hair first as usual with your       normal shampoo.  3.  After you shampoo, rinse your hair and body thoroughly to remove the                      Shampoo.  4.  Use CHG as you would any other liquid soap.  You can apply chg directly       to the skin and wash gently with scrungie or a clean washcloth.  5.  Apply the CHG Soap to your body ONLY FROM THE NECK DOWN.        Do not use on open wounds or open sores.  Avoid contact with your eyes,       ears, mouth and genitals (private parts).  Wash genitals (private parts)       with your normal soap.  6.  Wash thoroughly, paying special attention to the area where your surgery        will be performed.  7.  Thoroughly rinse your body with warm water from the neck down.  8.  DO NOT shower/wash with your normal soap after using and rinsing off       the CHG Soap.  9.  Pat yourself dry with a clean towel.            10.  Wear clean pajamas.            11.  Place clean sheets on your bed the night of your first shower and do not        sleep with pets.  Day of Surgery  Do not apply any lotions/deoderants the morning of surgery.  Please wear clean clothes to the hospital/surgery center.     Please read over the following fact sheets that you were given: Pain Booklet, Coughing and Deep Breathing, Blood Transfusion Information, MRSA Information and Surgical Site Infection Prevention

## 2014-05-12 LAB — HEMOGLOBIN A1C
Hgb A1c MFr Bld: 6.5 % — ABNORMAL HIGH (ref 4.8–5.6)
Mean Plasma Glucose: 140 mg/dL

## 2014-05-14 MED ORDER — METOPROLOL TARTRATE 12.5 MG HALF TABLET
12.5000 mg | ORAL_TABLET | Freq: Once | ORAL | Status: DC
Start: 2014-05-14 — End: 2014-05-15

## 2014-05-14 MED ORDER — SODIUM CHLORIDE 0.9 % IV SOLN
INTRAVENOUS | Status: DC
  Filled 2014-05-14: qty 30

## 2014-05-14 MED ORDER — GLUTARALDEHYDE 0.625% SOAKING SOLUTION
TOPICAL | Status: DC | PRN
  Filled 2014-05-14: qty 50

## 2014-05-14 MED ORDER — EPINEPHRINE HCL 1 MG/ML IJ SOLN
0.0000 ug/min | INTRAMUSCULAR | Status: DC
  Filled 2014-05-14: qty 4

## 2014-05-14 MED ORDER — PLASMA-LYTE 148 IV SOLN
INTRAVENOUS | Status: AC
  Administered 2014-05-15: 500 mL
  Filled 2014-05-14: qty 2.5

## 2014-05-14 MED ORDER — SODIUM CHLORIDE 0.9 % IV SOLN
INTRAVENOUS | Status: AC
  Administered 2014-05-15: 69.8 mL/h via INTRAVENOUS
  Filled 2014-05-14: qty 40

## 2014-05-14 MED ORDER — DEXTROSE 5 % IV SOLN
1.5000 g | INTRAVENOUS | Status: AC
  Administered 2014-05-15: 1.5 g via INTRAVENOUS
  Administered 2014-05-15: .75 g via INTRAVENOUS
  Filled 2014-05-14: qty 1.5

## 2014-05-14 MED ORDER — SODIUM CHLORIDE 0.9 % IV SOLN
INTRAVENOUS | Status: AC
  Administered 2014-05-15: 1 [IU]/h via INTRAVENOUS
  Filled 2014-05-14: qty 2.5

## 2014-05-14 MED ORDER — VANCOMYCIN HCL 1000 MG IV SOLR
INTRAVENOUS | Status: AC
  Administered 2014-05-15: 1000 mL
  Filled 2014-05-14: qty 1000

## 2014-05-14 MED ORDER — DOPAMINE-DEXTROSE 3.2-5 MG/ML-% IV SOLN
0.0000 ug/kg/min | INTRAVENOUS | Status: AC
  Administered 2014-05-15: 2 ug/kg/min via INTRAVENOUS
  Filled 2014-05-14: qty 250

## 2014-05-14 MED ORDER — POTASSIUM CHLORIDE 2 MEQ/ML IV SOLN
80.0000 meq | INTRAVENOUS | Status: DC
  Filled 2014-05-14: qty 40

## 2014-05-14 MED ORDER — SODIUM CHLORIDE 0.9 % IV SOLN
1500.0000 mg | INTRAVENOUS | Status: AC
  Administered 2014-05-15: 1500 mg via INTRAVENOUS
  Filled 2014-05-14 (×2): qty 1500

## 2014-05-14 MED ORDER — MAGNESIUM SULFATE 50 % IJ SOLN
40.0000 meq | INTRAMUSCULAR | Status: DC
  Filled 2014-05-14: qty 10

## 2014-05-14 MED ORDER — PHENYLEPHRINE HCL 10 MG/ML IJ SOLN
30.0000 ug/min | INTRAVENOUS | Status: AC
  Administered 2014-05-15: 50 ug/min via INTRAVENOUS
  Filled 2014-05-14: qty 2

## 2014-05-14 MED ORDER — DEXTROSE 5 % IV SOLN
750.0000 mg | INTRAVENOUS | Status: DC
  Filled 2014-05-14: qty 750

## 2014-05-14 MED ORDER — NITROGLYCERIN IN D5W 200-5 MCG/ML-% IV SOLN
2.0000 ug/min | INTRAVENOUS | Status: AC
  Administered 2014-05-15: 5 ug/min via INTRAVENOUS
  Filled 2014-05-14: qty 250

## 2014-05-14 MED ORDER — DEXMEDETOMIDINE HCL IN NACL 400 MCG/100ML IV SOLN
0.1000 ug/kg/h | INTRAVENOUS | Status: AC
  Administered 2014-05-15: 0.4 ug/kg/h via INTRAVENOUS
  Filled 2014-05-14: qty 100

## 2014-05-14 NOTE — Anesthesia Preprocedure Evaluation (Addendum)
Anesthesia Evaluation  Patient identified by MRN, date of birth, ID band Patient awake    Reviewed: Allergy & Precautions, NPO status , Patient's Chart, lab work & pertinent test results, reviewed documented beta blocker date and time   History of Anesthesia Complications (+) history of anesthetic complications  Airway Mallampati: II  TM Distance: >3 FB Neck ROM: Full    Dental  (+) Missing, Dental Advisory Given, Chipped   Pulmonary sleep apnea and Continuous Positive Airway Pressure Ventilation , Current Smoker,  breath sounds clear to auscultation        Cardiovascular hypertension, Pt. on medications + CAD (70% Cx) + dysrhythmias Atrial Fibrillation Rhythm:Regular Rate:Normal  '15 ECHO: normal LVF, mod MS with mild MR, mean gradient 9mm Hg   Neuro/Psych Anxiety Depression negative neurological ROS     GI/Hepatic negative GI ROS, Neg liver ROS,   Endo/Other  Hypothyroidism   Renal/GU Renal InsufficiencyRenal disease (creat 2.04)     Musculoskeletal   Abdominal (+) + obese,   Peds  Hematology  (+) Blood dyscrasia (INR 1.85), ,   Anesthesia Other Findings   Reproductive/Obstetrics                           Anesthesia Physical Anesthesia Plan  ASA: III  Anesthesia Plan: General   Post-op Pain Management:    Induction: Intravenous  Airway Management Planned: Oral ETT and Double Lumen EBT  Additional Equipment: Arterial line, PA Cath, 3D TEE and Ultrasound Guidance Line Placement  Intra-op Plan:   Post-operative Plan: Post-operative intubation/ventilation  Informed Consent: I have reviewed the patients History and Physical, chart, labs and discussed the procedure including the risks, benefits and alternatives for the proposed anesthesia with the patient or authorized representative who has indicated his/her understanding and acceptance.   Dental advisory given  Plan Discussed  with: CRNA and Surgeon  Anesthesia Plan Comments: (Plan routine monitors, A line, PA cath, GETA with TEE)        Anesthesia Quick Evaluation

## 2014-05-14 NOTE — Progress Notes (Signed)
Anesthesia Chart Review:  Patient is a 59 year old male scheduled for minimally invasive MR replacement, Maze procedure on 05/15/14 by Dr. Roxy Manns. Procedure was initially scheduled for 04/19/14 but was canceled due to new severe rash in both groins related to cutaneous yeast infection.  See prior anesthesia note from Willeen Cass, FNP-BC on 04/16/14.  Preoperative labs noted.  WBC 13.0, stable since 01/15/14. Cr 2.04 which appears within his usual baseline sine his right nephrectomy 08/2013. His non-fasting glucose is 234 with A1C of 6.5.  He reported only a history of pre-diabetes.  Will ask staff to get a fasting CBG on arrival. PT/PTT are also elevated (his prior PTT was normal on 04/19/14).  His last pre-operative Coumadin dose was 05/09/14. Will recheck STAT PT/PTT on arrival. Labs are marked as reviewed by Dr. Roxy Manns.   His yeast infection had improved by 05/07/14.  He will get a CBG and PT/PTT on arrival.  If results are felt acceptable for OR then I would anticipate that he could proceed as planned.  He will need close monitoring of his renal function post-operatively.  Kevin English [facility] Short Stay Center/Anesthesiology Phone [phone] 05/14/2014 9:31 AM

## 2014-05-15 ENCOUNTER — Inpatient Hospital Stay (HOSPITAL_COMMUNITY): Payer: Managed Care, Other (non HMO) | Admitting: Certified Registered Nurse Anesthetist

## 2014-05-15 ENCOUNTER — Encounter (HOSPITAL_COMMUNITY)
Admission: RE | Disposition: A | Discharge status: Home / Self Care | Payer: Managed Care, Other (non HMO) | Source: Ambulatory Visit | Attending: Thoracic Surgery (Cardiothoracic Vascular Surgery)

## 2014-05-15 ENCOUNTER — Inpatient Hospital Stay (HOSPITAL_COMMUNITY)
Admission: RE | Admit: 2014-05-15 | Discharge: 2014-05-26 | DRG: 219 | Disposition: A | Discharge status: Home / Self Care | Payer: Managed Care, Other (non HMO) | Source: Ambulatory Visit | Attending: Thoracic Surgery (Cardiothoracic Vascular Surgery) | Admitting: Thoracic Surgery (Cardiothoracic Vascular Surgery)

## 2014-05-15 ENCOUNTER — Encounter (HOSPITAL_COMMUNITY): Payer: Self-pay | Admitting: *Deleted

## 2014-05-15 ENCOUNTER — Inpatient Hospital Stay (HOSPITAL_COMMUNITY): Payer: Managed Care, Other (non HMO)

## 2014-05-15 DIAGNOSIS — Z905 Acquired absence of kidney: Secondary | ICD-10-CM | POA: Diagnosis present

## 2014-05-15 DIAGNOSIS — E669 Obesity, unspecified: Secondary | ICD-10-CM | POA: Diagnosis present

## 2014-05-15 DIAGNOSIS — Z8679 Personal history of other diseases of the circulatory system: Secondary | ICD-10-CM

## 2014-05-15 DIAGNOSIS — E785 Hyperlipidemia, unspecified: Secondary | ICD-10-CM | POA: Diagnosis present

## 2014-05-15 DIAGNOSIS — I480 Paroxysmal atrial fibrillation: Secondary | ICD-10-CM

## 2014-05-15 DIAGNOSIS — I050 Rheumatic mitral stenosis: Secondary | ICD-10-CM | POA: Diagnosis present

## 2014-05-15 DIAGNOSIS — I099 Rheumatic heart disease, unspecified: Secondary | ICD-10-CM | POA: Diagnosis present

## 2014-05-15 DIAGNOSIS — I5032 Chronic diastolic (congestive) heart failure: Secondary | ICD-10-CM

## 2014-05-15 DIAGNOSIS — Z8673 Personal history of transient ischemic attack (TIA), and cerebral infarction without residual deficits: Secondary | ICD-10-CM

## 2014-05-15 DIAGNOSIS — Z954 Presence of other heart-valve replacement: Secondary | ICD-10-CM

## 2014-05-15 DIAGNOSIS — H9192 Unspecified hearing loss, left ear: Secondary | ICD-10-CM | POA: Diagnosis present

## 2014-05-15 DIAGNOSIS — E8770 Fluid overload, unspecified: Secondary | ICD-10-CM

## 2014-05-15 DIAGNOSIS — Z9689 Presence of other specified functional implants: Secondary | ICD-10-CM

## 2014-05-15 DIAGNOSIS — J9601 Acute respiratory failure with hypoxia: Secondary | ICD-10-CM | POA: Diagnosis not present

## 2014-05-15 DIAGNOSIS — F419 Anxiety disorder, unspecified: Secondary | ICD-10-CM | POA: Diagnosis present

## 2014-05-15 DIAGNOSIS — I442 Atrioventricular block, complete: Secondary | ICD-10-CM | POA: Diagnosis present

## 2014-05-15 DIAGNOSIS — G4733 Obstructive sleep apnea (adult) (pediatric): Secondary | ICD-10-CM | POA: Diagnosis present

## 2014-05-15 DIAGNOSIS — I2510 Atherosclerotic heart disease of native coronary artery without angina pectoris: Secondary | ICD-10-CM | POA: Diagnosis present

## 2014-05-15 DIAGNOSIS — N182 Chronic kidney disease, stage 2 (mild): Secondary | ICD-10-CM | POA: Diagnosis present

## 2014-05-15 DIAGNOSIS — R791 Abnormal coagulation profile: Secondary | ICD-10-CM | POA: Diagnosis not present

## 2014-05-15 DIAGNOSIS — I5033 Acute on chronic diastolic (congestive) heart failure: Secondary | ICD-10-CM | POA: Diagnosis present

## 2014-05-15 DIAGNOSIS — Z7901 Long term (current) use of anticoagulants: Secondary | ICD-10-CM

## 2014-05-15 DIAGNOSIS — I481 Persistent atrial fibrillation: Secondary | ICD-10-CM | POA: Diagnosis not present

## 2014-05-15 DIAGNOSIS — E039 Hypothyroidism, unspecified: Secondary | ICD-10-CM | POA: Diagnosis present

## 2014-05-15 DIAGNOSIS — R911 Solitary pulmonary nodule: Secondary | ICD-10-CM | POA: Diagnosis present

## 2014-05-15 DIAGNOSIS — I342 Nonrheumatic mitral (valve) stenosis: Secondary | ICD-10-CM

## 2014-05-15 DIAGNOSIS — Z6828 Body mass index (BMI) 28.0-28.9, adult: Secondary | ICD-10-CM | POA: Diagnosis not present

## 2014-05-15 DIAGNOSIS — Z79899 Other long term (current) drug therapy: Secondary | ICD-10-CM

## 2014-05-15 DIAGNOSIS — J9811 Atelectasis: Secondary | ICD-10-CM

## 2014-05-15 DIAGNOSIS — I509 Heart failure, unspecified: Secondary | ICD-10-CM

## 2014-05-15 DIAGNOSIS — C641 Malignant neoplasm of right kidney, except renal pelvis: Secondary | ICD-10-CM | POA: Diagnosis present

## 2014-05-15 DIAGNOSIS — F329 Major depressive disorder, single episode, unspecified: Secondary | ICD-10-CM | POA: Diagnosis present

## 2014-05-15 DIAGNOSIS — I129 Hypertensive chronic kidney disease with stage 1 through stage 4 chronic kidney disease, or unspecified chronic kidney disease: Secondary | ICD-10-CM | POA: Diagnosis present

## 2014-05-15 DIAGNOSIS — R0603 Acute respiratory distress: Secondary | ICD-10-CM

## 2014-05-15 DIAGNOSIS — F17210 Nicotine dependence, cigarettes, uncomplicated: Secondary | ICD-10-CM | POA: Diagnosis present

## 2014-05-15 DIAGNOSIS — Z9889 Other specified postprocedural states: Secondary | ICD-10-CM | POA: Diagnosis not present

## 2014-05-15 DIAGNOSIS — E876 Hypokalemia: Secondary | ICD-10-CM | POA: Diagnosis not present

## 2014-05-15 DIAGNOSIS — I10 Essential (primary) hypertension: Secondary | ICD-10-CM | POA: Diagnosis present

## 2014-05-15 DIAGNOSIS — I080 Rheumatic disorders of both mitral and aortic valves: Secondary | ICD-10-CM | POA: Diagnosis present

## 2014-05-15 DIAGNOSIS — R0602 Shortness of breath: Secondary | ICD-10-CM | POA: Diagnosis not present

## 2014-05-15 DIAGNOSIS — G4730 Sleep apnea, unspecified: Secondary | ICD-10-CM | POA: Diagnosis present

## 2014-05-15 DIAGNOSIS — J189 Pneumonia, unspecified organism: Secondary | ICD-10-CM

## 2014-05-15 DIAGNOSIS — T81509A Unspecified complication of foreign body accidentally left in body following unspecified procedure, initial encounter: Secondary | ICD-10-CM

## 2014-05-15 HISTORY — DX: Presence of other heart-valve replacement: Z95.4

## 2014-05-15 HISTORY — PX: MINIMALLY INVASIVE MAZE PROCEDURE: SHX6244

## 2014-05-15 HISTORY — DX: Personal history of other diseases of the circulatory system: Z86.79

## 2014-05-15 HISTORY — PX: TEE WITHOUT CARDIOVERSION: SHX5443

## 2014-05-15 HISTORY — PX: MITRAL VALVE REPLACEMENT: SHX147

## 2014-05-15 HISTORY — DX: Other persistent atrial fibrillation: I48.19

## 2014-05-15 HISTORY — DX: Other specified postprocedural states: Z98.890

## 2014-05-15 LAB — POCT I-STAT 3, ART BLOOD GAS (G3+)
ACID-BASE DEFICIT: 7 mmol/L — AB (ref 0.0–2.0)
Acid-base deficit: 3 mmol/L — ABNORMAL HIGH (ref 0.0–2.0)
Acid-base deficit: 7 mmol/L — ABNORMAL HIGH (ref 0.0–2.0)
Acid-base deficit: 7 mmol/L — ABNORMAL HIGH (ref 0.0–2.0)
Acid-base deficit: 5 mmol/L — ABNORMAL HIGH (ref 0.0–2.0)
BICARBONATE: 23.2 meq/L (ref 20.0–24.0)
BICARBONATE: 20.8 meq/L (ref 20.0–24.0)
Bicarbonate: 21.7 mEq/L (ref 20.0–24.0)
Bicarbonate: 20.6 mEq/L (ref 20.0–24.0)
Bicarbonate: 21.5 mEq/L (ref 20.0–24.0)
O2 SAT: 79 %
O2 SAT: 85 %
O2 SAT: 92 %
O2 SAT: 86 %
O2 Saturation: 100 %
PCO2 ART: 50.1 mmHg — AB (ref 35.0–45.0)
PCO2 ART: 46.1 mmHg — AB (ref 35.0–45.0)
PCO2 ART: 42.9 mmHg (ref 35.0–45.0)
PH ART: 7.3 — AB (ref 7.350–7.450)
PH ART: 7.171 — AB (ref 7.350–7.450)
PH ART: 7.252 — AB (ref 7.350–7.450)
PO2 ART: 407 mmHg — AB (ref 80.0–100.0)
PO2 ART: 60 mmHg — AB (ref 80.0–100.0)
PO2 ART: 65 mmHg — AB (ref 80.0–100.0)
Patient temperature: 34.9
Patient temperature: 35.2
TCO2: 25 mmol/L (ref 0–100)
TCO2: 23 mmol/L (ref 0–100)
TCO2: 22 mmol/L (ref 0–100)
TCO2: 22 mmol/L (ref 0–100)
TCO2: 23 mmol/L (ref 0–100)
pCO2 arterial: 47.1 mmHg — ABNORMAL HIGH (ref 35.0–45.0)
pCO2 arterial: 59.2 mmHg (ref 35.0–45.0)
pH, Arterial: 7.223 — ABNORMAL LOW (ref 7.350–7.450)
pH, Arterial: 7.299 — ABNORMAL LOW (ref 7.350–7.450)
pO2, Arterial: 55 mmHg — ABNORMAL LOW (ref 80.0–100.0)
pO2, Arterial: 52 mmHg — ABNORMAL LOW (ref 80.0–100.0)

## 2014-05-15 LAB — POCT I-STAT, CHEM 8
BUN: 20 mg/dL (ref 6–23)
BUN: 18 mg/dL (ref 6–23)
BUN: 20 mg/dL (ref 6–23)
BUN: 18 mg/dL (ref 6–23)
BUN: 18 mg/dL (ref 6–23)
BUN: 18 mg/dL (ref 6–23)
BUN: 17 mg/dL (ref 6–23)
CALCIUM ION: 1.13 mmol/L (ref 1.13–1.30)
CHLORIDE: 104 mmol/L (ref 96–112)
CHLORIDE: 109 mmol/L (ref 96–112)
CREATININE: 1.6 mg/dL — AB (ref 0.50–1.35)
CREATININE: 1.7 mg/dL — AB (ref 0.50–1.35)
CREATININE: 1.4 mg/dL — AB (ref 0.50–1.35)
CREATININE: 1.5 mg/dL — AB (ref 0.50–1.35)
CREATININE: 1.5 mg/dL — AB (ref 0.50–1.35)
Calcium, Ion: 1.32 mmol/L — ABNORMAL HIGH (ref 1.13–1.30)
Calcium, Ion: 1.12 mmol/L — ABNORMAL LOW (ref 1.13–1.30)
Calcium, Ion: 1.36 mmol/L — ABNORMAL HIGH (ref 1.13–1.30)
Calcium, Ion: 1.1 mmol/L — ABNORMAL LOW (ref 1.13–1.30)
Calcium, Ion: 1.06 mmol/L — ABNORMAL LOW (ref 1.13–1.30)
Calcium, Ion: 1.17 mmol/L (ref 1.13–1.30)
Chloride: 106 mmol/L (ref 96–112)
Chloride: 101 mmol/L (ref 96–112)
Chloride: 105 mmol/L (ref 96–112)
Chloride: 106 mmol/L (ref 96–112)
Chloride: 104 mmol/L (ref 96–112)
Creatinine, Ser: 1.5 mg/dL — ABNORMAL HIGH (ref 0.50–1.35)
Creatinine, Ser: 1.5 mg/dL — ABNORMAL HIGH (ref 0.50–1.35)
GLUCOSE: 173 mg/dL — AB (ref 70–99)
Glucose, Bld: 131 mg/dL — ABNORMAL HIGH (ref 70–99)
Glucose, Bld: 123 mg/dL — ABNORMAL HIGH (ref 70–99)
Glucose, Bld: 139 mg/dL — ABNORMAL HIGH (ref 70–99)
Glucose, Bld: 118 mg/dL — ABNORMAL HIGH (ref 70–99)
Glucose, Bld: 143 mg/dL — ABNORMAL HIGH (ref 70–99)
Glucose, Bld: 146 mg/dL — ABNORMAL HIGH (ref 70–99)
HCT: 42 % (ref 39.0–52.0)
HCT: 42 % (ref 39.0–52.0)
HCT: 27 % — ABNORMAL LOW (ref 39.0–52.0)
HCT: 27 % — ABNORMAL LOW (ref 39.0–52.0)
HCT: 40 % (ref 39.0–52.0)
HEMATOCRIT: 30 % — AB (ref 39.0–52.0)
HEMATOCRIT: 38 % — AB (ref 39.0–52.0)
HEMOGLOBIN: 14.3 g/dL (ref 13.0–17.0)
HEMOGLOBIN: 10.2 g/dL — AB (ref 13.0–17.0)
HEMOGLOBIN: 14.3 g/dL (ref 13.0–17.0)
HEMOGLOBIN: 9.2 g/dL — AB (ref 13.0–17.0)
Hemoglobin: 9.2 g/dL — ABNORMAL LOW (ref 13.0–17.0)
Hemoglobin: 12.9 g/dL — ABNORMAL LOW (ref 13.0–17.0)
Hemoglobin: 13.6 g/dL (ref 13.0–17.0)
POTASSIUM: 4 mmol/L (ref 3.5–5.1)
POTASSIUM: 3.9 mmol/L (ref 3.5–5.1)
POTASSIUM: 4.7 mmol/L (ref 3.5–5.1)
POTASSIUM: 5.4 mmol/L — AB (ref 3.5–5.1)
POTASSIUM: 4.5 mmol/L (ref 3.5–5.1)
POTASSIUM: 4.1 mmol/L (ref 3.5–5.1)
Potassium: 4.2 mmol/L (ref 3.5–5.1)
SODIUM: 141 mmol/L (ref 135–145)
SODIUM: 137 mmol/L (ref 135–145)
SODIUM: 141 mmol/L (ref 135–145)
SODIUM: 138 mmol/L (ref 135–145)
Sodium: 138 mmol/L (ref 135–145)
Sodium: 140 mmol/L (ref 135–145)
Sodium: 142 mmol/L (ref 135–145)
TCO2: 24 mmol/L (ref 0–100)
TCO2: 21 mmol/L (ref 0–100)
TCO2: 21 mmol/L (ref 0–100)
TCO2: 22 mmol/L (ref 0–100)
TCO2: 21 mmol/L (ref 0–100)
TCO2: 19 mmol/L (ref 0–100)
TCO2: 17 mmol/L (ref 0–100)

## 2014-05-15 LAB — HEMOGLOBIN AND HEMATOCRIT, BLOOD
HCT: 29.1 % — ABNORMAL LOW (ref 39.0–52.0)
HEMOGLOBIN: 9.9 g/dL — AB (ref 13.0–17.0)

## 2014-05-15 LAB — PROTIME-INR
INR: 1.06 (ref 0.00–1.49)
INR: 1.41 (ref 0.00–1.49)
PROTHROMBIN TIME: 13.9 s (ref 11.6–15.2)
Prothrombin Time: 17.4 seconds — ABNORMAL HIGH (ref 11.6–15.2)

## 2014-05-15 LAB — CREATININE, SERUM
CREATININE: 1.64 mg/dL — AB (ref 0.50–1.35)
GFR calc Af Amer: 51 mL/min — ABNORMAL LOW (ref >90)
GFR calc non Af Amer: 44 mL/min — ABNORMAL LOW (ref >90)

## 2014-05-15 LAB — CBC
HCT: 39.5 % (ref 39.0–52.0)
HCT: 38.5 % — ABNORMAL LOW (ref 39.0–52.0)
HEMOGLOBIN: 13.2 g/dL (ref 13.0–17.0)
Hemoglobin: 13.1 g/dL (ref 13.0–17.0)
MCH: 30.3 pg (ref 26.0–34.0)
MCH: 30.8 pg (ref 26.0–34.0)
MCHC: 33.4 g/dL (ref 30.0–36.0)
MCHC: 34 g/dL (ref 30.0–36.0)
MCV: 90.6 fL (ref 78.0–100.0)
MCV: 90.4 fL (ref 78.0–100.0)
PLATELETS: 173 10*3/uL (ref 150–400)
PLATELETS: 193 10*3/uL (ref 150–400)
RBC: 4.36 MIL/uL (ref 4.22–5.81)
RBC: 4.26 MIL/uL (ref 4.22–5.81)
RDW: 13.9 % (ref 11.5–15.5)
RDW: 14 % (ref 11.5–15.5)
WBC: 25.1 10*3/uL — AB (ref 4.0–10.5)
WBC: 26.6 10*3/uL — ABNORMAL HIGH (ref 4.0–10.5)

## 2014-05-15 LAB — APTT
aPTT: 35 seconds (ref 24–37)
aPTT: 34 seconds (ref 24–37)

## 2014-05-15 LAB — POCT I-STAT 4, (NA,K, GLUC, HGB,HCT)
Glucose, Bld: 143 mg/dL — ABNORMAL HIGH (ref 70–99)
HEMATOCRIT: 42 % (ref 39.0–52.0)
HEMOGLOBIN: 14.3 g/dL (ref 13.0–17.0)
Potassium: 4.1 mmol/L (ref 3.5–5.1)
Sodium: 141 mmol/L (ref 135–145)

## 2014-05-15 LAB — GLUCOSE, CAPILLARY
GLUCOSE-CAPILLARY: 124 mg/dL — AB (ref 70–99)
GLUCOSE-CAPILLARY: 92 mg/dL (ref 70–99)
Glucose-Capillary: 138 mg/dL — ABNORMAL HIGH (ref 70–99)

## 2014-05-15 LAB — POCT I-STAT GLUCOSE
Glucose, Bld: 139 mg/dL — ABNORMAL HIGH (ref 70–99)
Operator id: 318441

## 2014-05-15 LAB — PLATELET COUNT: PLATELETS: 162 10*3/uL (ref 150–400)

## 2014-05-15 LAB — MAGNESIUM: MAGNESIUM: 3 mg/dL — AB (ref 1.5–2.5)

## 2014-05-15 SURGERY — REPLACEMENT, MITRAL VALVE, MINIMALLY INVASIVE
Anesthesia: General | Site: Chest | Laterality: Right

## 2014-05-15 MED ORDER — LACTATED RINGERS IV SOLN
INTRAVENOUS | Status: DC | PRN
  Administered 2014-05-15: 07:00:00 via INTRAVENOUS

## 2014-05-15 MED ORDER — LACTATED RINGERS IV SOLN: INTRAVENOUS | Status: DC | PRN

## 2014-05-15 MED ORDER — MIDAZOLAM HCL 5 MG/5ML IJ SOLN
INTRAMUSCULAR | Status: DC | PRN
  Administered 2014-05-15: 1 mg via INTRAVENOUS
  Administered 2014-05-15: 2 mg via INTRAVENOUS
  Administered 2014-05-15: 4 mg via INTRAVENOUS
  Administered 2014-05-15: 1 mg via INTRAVENOUS
  Administered 2014-05-15: 2 mg via INTRAVENOUS

## 2014-05-15 MED ORDER — ALBUMIN HUMAN 5 % IV SOLN
250.0000 mL | INTRAVENOUS | Status: AC | PRN
  Administered 2014-05-15: 250 mL via INTRAVENOUS

## 2014-05-15 MED ORDER — MAGNESIUM SULFATE 4 GM/100ML IV SOLN
4.0000 g | Freq: Once | INTRAVENOUS | Status: AC
  Administered 2014-05-15: 4 g via INTRAVENOUS
  Filled 2014-05-15: qty 100

## 2014-05-15 MED ORDER — CETYLPYRIDINIUM CHLORIDE 0.05 % MT LIQD
7.0000 mL | Freq: Four times a day (QID) | OROMUCOSAL | Status: DC
  Administered 2014-05-16 (×2): 7 mL via OROMUCOSAL

## 2014-05-15 MED ORDER — SODIUM CHLORIDE 0.9 % IV SOLN
INTRAVENOUS | Status: AC
  Administered 2014-05-15: 18:00:00 via INTRAVENOUS
  Filled 2014-05-15: qty 2.5

## 2014-05-15 MED ORDER — PROTAMINE SULFATE 10 MG/ML IV SOLN
INTRAVENOUS | Status: DC | PRN
  Administered 2014-05-15: 50 mg via INTRAVENOUS
  Administered 2014-05-15: 10 mg via INTRAVENOUS
  Administered 2014-05-15: 30 mg via INTRAVENOUS
  Administered 2014-05-15: 50 mg via INTRAVENOUS
  Administered 2014-05-15: 30 mg via INTRAVENOUS
  Administered 2014-05-15: 50 mg via INTRAVENOUS
  Administered 2014-05-15: 30 mg via INTRAVENOUS

## 2014-05-15 MED ORDER — BISACODYL 10 MG RE SUPP
10.0000 mg | Freq: Every day | RECTAL | Status: DC
  Administered 2014-05-18: 10 mg via RECTAL
  Filled 2014-05-15: qty 1

## 2014-05-15 MED ORDER — FAMOTIDINE IN NACL 20-0.9 MG/50ML-% IV SOLN
20.0000 mg | Freq: Two times a day (BID) | INTRAVENOUS | Status: AC
  Administered 2014-05-15 (×2): 20 mg via INTRAVENOUS
  Filled 2014-05-15: qty 50

## 2014-05-15 MED ORDER — FENTANYL CITRATE 0.05 MG/ML IJ SOLN
INTRAMUSCULAR | Status: AC
  Filled 2014-05-15: qty 5

## 2014-05-15 MED ORDER — ACETAMINOPHEN 160 MG/5ML PO SOLN: 1000.0000 mg | Freq: Four times a day (QID) | ORAL | Status: DC

## 2014-05-15 MED ORDER — SODIUM CHLORIDE 0.9 % IV SOLN
INTRAVENOUS | Status: DC
  Administered 2014-05-15 (×2): via INTRAVENOUS

## 2014-05-15 MED ORDER — ARTIFICIAL TEARS OP OINT
TOPICAL_OINTMENT | OPHTHALMIC | Status: AC
  Filled 2014-05-15: qty 3.5

## 2014-05-15 MED ORDER — SODIUM CHLORIDE 0.9 % IV SOLN
INTRAVENOUS | Status: DC
  Administered 2014-05-15: 15:00:00 via INTRAVENOUS

## 2014-05-15 MED ORDER — ACETAMINOPHEN 160 MG/5ML PO SOLN: 650.0000 mg | Freq: Once | ORAL | Status: AC

## 2014-05-15 MED ORDER — SODIUM CHLORIDE 0.9 % IR SOLN
Status: DC | PRN
  Administered 2014-05-15: 3000 mL

## 2014-05-15 MED ORDER — CHLORHEXIDINE GLUCONATE 0.12 % MT SOLN
15.0000 mL | Freq: Two times a day (BID) | OROMUCOSAL | Status: DC
  Administered 2014-05-15 (×2): 15 mL via OROMUCOSAL
  Filled 2014-05-15 (×2): qty 15

## 2014-05-15 MED ORDER — VECURONIUM BROMIDE 10 MG IV SOLR
INTRAVENOUS | Status: DC | PRN
  Administered 2014-05-15 (×3): 5 mg via INTRAVENOUS

## 2014-05-15 MED ORDER — DOCUSATE SODIUM 100 MG PO CAPS
200.0000 mg | ORAL_CAPSULE | Freq: Every day | ORAL | Status: DC
  Administered 2014-05-16 – 2014-05-17 (×2): 200 mg via ORAL
  Filled 2014-05-15 (×2): qty 2

## 2014-05-15 MED ORDER — PHENYLEPHRINE HCL 10 MG/ML IJ SOLN
0.0000 ug/min | INTRAVENOUS | Status: DC
  Administered 2014-05-16: 10 ug/min via INTRAVENOUS
  Filled 2014-05-15: qty 2

## 2014-05-15 MED ORDER — SODIUM CHLORIDE 0.9 % IV SOLN: 250.0000 mL | INTRAVENOUS | Status: DC

## 2014-05-15 MED ORDER — OXYCODONE HCL 5 MG PO TABS
5.0000 mg | ORAL_TABLET | ORAL | Status: DC | PRN
  Administered 2014-05-16 – 2014-05-19 (×7): 10 mg via ORAL
  Administered 2014-05-20: 5 mg via ORAL
  Filled 2014-05-15: qty 1
  Filled 2014-05-15 (×7): qty 2

## 2014-05-15 MED ORDER — LACTATED RINGERS IV SOLN
INTRAVENOUS | Status: DC | PRN
  Administered 2014-05-15 (×2): via INTRAVENOUS

## 2014-05-15 MED ORDER — VECURONIUM BROMIDE 10 MG IV SOLR
INTRAVENOUS | Status: AC
  Filled 2014-05-15: qty 10

## 2014-05-15 MED ORDER — ASPIRIN 81 MG PO CHEW: 324.0000 mg | CHEWABLE_TABLET | Freq: Every day | ORAL | Status: DC

## 2014-05-15 MED ORDER — MIDAZOLAM HCL 2 MG/2ML IJ SOLN: 2.0000 mg | INTRAMUSCULAR | Status: DC | PRN

## 2014-05-15 MED ORDER — METOPROLOL TARTRATE 25 MG/10 ML ORAL SUSPENSION
12.5000 mg | Freq: Two times a day (BID) | ORAL | Status: DC
  Filled 2014-05-15 (×3): qty 5

## 2014-05-15 MED ORDER — INSULIN REGULAR BOLUS VIA INFUSION
0.0000 [IU] | Freq: Three times a day (TID) | INTRAVENOUS | Status: AC
  Administered 2014-05-16: 2 [IU] via INTRAVENOUS
  Filled 2014-05-15: qty 10

## 2014-05-15 MED ORDER — MORPHINE SULFATE 2 MG/ML IJ SOLN
2.0000 mg | INTRAMUSCULAR | Status: DC | PRN
  Administered 2014-05-16 (×2): 2 mg via INTRAVENOUS
  Filled 2014-05-15 (×2): qty 1
  Filled 2014-05-15: qty 2
  Filled 2014-05-15: qty 1

## 2014-05-15 MED ORDER — ONDANSETRON HCL 4 MG/2ML IJ SOLN
4.0000 mg | Freq: Four times a day (QID) | INTRAMUSCULAR | Status: DC | PRN
  Administered 2014-05-16: 4 mg via INTRAVENOUS
  Filled 2014-05-15 (×2): qty 2

## 2014-05-15 MED ORDER — HEPARIN SODIUM (PORCINE) 1000 UNIT/ML IJ SOLN
INTRAMUSCULAR | Status: AC
  Filled 2014-05-15: qty 3

## 2014-05-15 MED ORDER — LEVOTHYROXINE SODIUM 75 MCG PO TABS
75.0000 ug | ORAL_TABLET | Freq: Every day | ORAL | Status: DC
  Administered 2014-05-16 – 2014-05-26 (×11): 75 ug via ORAL
  Filled 2014-05-15 (×12): qty 1

## 2014-05-15 MED ORDER — NITROGLYCERIN IN D5W 200-5 MCG/ML-% IV SOLN: 0.0000 ug/min | INTRAVENOUS | Status: DC

## 2014-05-15 MED ORDER — LACTATED RINGERS IV SOLN: 500.0000 mL | Freq: Once | INTRAVENOUS | Status: AC | PRN

## 2014-05-15 MED ORDER — DEXTROSE 5 % IV SOLN
1.5000 g | Freq: Two times a day (BID) | INTRAVENOUS | Status: AC
  Administered 2014-05-15 – 2014-05-17 (×4): 1.5 g via INTRAVENOUS
  Filled 2014-05-15 (×4): qty 1.5

## 2014-05-15 MED ORDER — HEPARIN SODIUM (PORCINE) 1000 UNIT/ML IJ SOLN
INTRAMUSCULAR | Status: DC | PRN
  Administered 2014-05-15: 33000 [IU] via INTRAVENOUS

## 2014-05-15 MED ORDER — PROPOFOL 10 MG/ML IV BOLUS
INTRAVENOUS | Status: AC
  Filled 2014-05-15: qty 20

## 2014-05-15 MED ORDER — SODIUM CHLORIDE 0.9 % IV SOLN
INTRAVENOUS | Status: DC | PRN
  Administered 2014-05-15: 13:00:00 via INTRAVENOUS

## 2014-05-15 MED ORDER — DOPAMINE-DEXTROSE 1.6-5 MG/ML-% IV SOLN: INTRAVENOUS | Status: DC | PRN

## 2014-05-15 MED ORDER — ROCURONIUM BROMIDE 100 MG/10ML IV SOLN
INTRAVENOUS | Status: DC | PRN
  Administered 2014-05-15: 50 mg via INTRAVENOUS
  Administered 2014-05-15: 30 mg via INTRAVENOUS
  Administered 2014-05-15: 20 mg via INTRAVENOUS

## 2014-05-15 MED ORDER — LIDOCAINE HCL (CARDIAC) 20 MG/ML IV SOLN
INTRAVENOUS | Status: AC
  Filled 2014-05-15: qty 5

## 2014-05-15 MED ORDER — PANTOPRAZOLE SODIUM 40 MG PO TBEC
40.0000 mg | DELAYED_RELEASE_TABLET | Freq: Every day | ORAL | Status: DC
  Administered 2014-05-17: 40 mg via ORAL
  Filled 2014-05-15: qty 1

## 2014-05-15 MED ORDER — ALBUMIN HUMAN 5 % IV SOLN
INTRAVENOUS | Status: DC | PRN
  Administered 2014-05-15 (×2): via INTRAVENOUS

## 2014-05-15 MED ORDER — MORPHINE SULFATE 2 MG/ML IJ SOLN
1.0000 mg | INTRAMUSCULAR | Status: DC | PRN
  Administered 2014-05-15: 4 mg via INTRAVENOUS
  Administered 2014-05-15 (×2): 2 mg via INTRAVENOUS
  Filled 2014-05-15: qty 1

## 2014-05-15 MED ORDER — ALBUTEROL SULFATE HFA 108 (90 BASE) MCG/ACT IN AERS: INHALATION_SPRAY | RESPIRATORY_TRACT | Status: DC | PRN

## 2014-05-15 MED ORDER — DOPAMINE-DEXTROSE 3.2-5 MG/ML-% IV SOLN: 0.0000 ug/kg/min | INTRAVENOUS | Status: DC

## 2014-05-15 MED ORDER — FENTANYL CITRATE 0.05 MG/ML IJ SOLN
INTRAMUSCULAR | Status: DC | PRN
  Administered 2014-05-15: 150 ug via INTRAVENOUS
  Administered 2014-05-15: 100 ug via INTRAVENOUS
  Administered 2014-05-15: 750 ug via INTRAVENOUS

## 2014-05-15 MED ORDER — ACETAMINOPHEN 500 MG PO TABS
1000.0000 mg | ORAL_TABLET | Freq: Four times a day (QID) | ORAL | Status: AC
Start: 2014-05-16 — End: 2014-05-20
  Administered 2014-05-16 – 2014-05-20 (×15): 1000 mg via ORAL
  Filled 2014-05-15 (×18): qty 2

## 2014-05-15 MED ORDER — PROTAMINE SULFATE 10 MG/ML IV SOLN
INTRAVENOUS | Status: AC
  Filled 2014-05-15: qty 5

## 2014-05-15 MED ORDER — VANCOMYCIN HCL IN DEXTROSE 1-5 GM/200ML-% IV SOLN
1000.0000 mg | Freq: Once | INTRAVENOUS | Status: AC
  Administered 2014-05-15: 1000 mg via INTRAVENOUS
  Filled 2014-05-15: qty 200

## 2014-05-15 MED ORDER — TRAMADOL HCL 50 MG PO TABS
50.0000 mg | ORAL_TABLET | ORAL | Status: DC | PRN
  Administered 2014-05-20: 50 mg via ORAL
  Filled 2014-05-15: qty 1

## 2014-05-15 MED ORDER — POTASSIUM CHLORIDE 10 MEQ/50ML IV SOLN: 10.0000 meq | INTRAVENOUS | Status: AC

## 2014-05-15 MED ORDER — PROPOFOL 10 MG/ML IV BOLUS
INTRAVENOUS | Status: DC | PRN
  Administered 2014-05-15: 30 mg via INTRAVENOUS

## 2014-05-15 MED ORDER — MIDAZOLAM HCL 10 MG/2ML IJ SOLN
INTRAMUSCULAR | Status: AC
  Filled 2014-05-15: qty 2

## 2014-05-15 MED ORDER — PROTAMINE SULFATE 10 MG/ML IV SOLN
INTRAVENOUS | Status: AC
  Filled 2014-05-15: qty 25

## 2014-05-15 MED ORDER — SODIUM CHLORIDE 0.45 % IV SOLN
INTRAVENOUS | Status: DC
  Administered 2014-05-15: 15:00:00 via INTRAVENOUS

## 2014-05-15 MED ORDER — SODIUM CHLORIDE 0.9 % IJ SOLN: 3.0000 mL | INTRAMUSCULAR | Status: DC | PRN

## 2014-05-15 MED ORDER — ASPIRIN EC 325 MG PO TBEC
325.0000 mg | DELAYED_RELEASE_TABLET | Freq: Every day | ORAL | Status: DC
  Administered 2014-05-16 – 2014-05-23 (×8): 325 mg via ORAL
  Filled 2014-05-15 (×9): qty 1

## 2014-05-15 MED ORDER — ARTIFICIAL TEARS OP OINT
TOPICAL_OINTMENT | OPHTHALMIC | Status: DC | PRN
  Administered 2014-05-15: 1 via OPHTHALMIC

## 2014-05-15 MED ORDER — ACETAMINOPHEN 650 MG RE SUPP
650.0000 mg | Freq: Once | RECTAL | Status: AC
  Administered 2014-05-15: 650 mg via RECTAL

## 2014-05-15 MED ORDER — DEXMEDETOMIDINE HCL IN NACL 200 MCG/50ML IV SOLN
0.0000 ug/kg/h | INTRAVENOUS | Status: DC
  Administered 2014-05-15: 0.5 ug/kg/h via INTRAVENOUS
  Administered 2014-05-15 (×2): 0.7 ug/kg/h via INTRAVENOUS
  Filled 2014-05-15 (×3): qty 50

## 2014-05-15 MED ORDER — METOPROLOL TARTRATE 1 MG/ML IV SOLN: 2.5000 mg | INTRAVENOUS | Status: DC | PRN

## 2014-05-15 MED ORDER — BISACODYL 5 MG PO TBEC
10.0000 mg | DELAYED_RELEASE_TABLET | Freq: Every day | ORAL | Status: DC
Start: 2014-05-16 — End: 2014-05-18
  Administered 2014-05-16 – 2014-05-17 (×2): 10 mg via ORAL
  Filled 2014-05-15 (×2): qty 2

## 2014-05-15 MED ORDER — 0.9 % SODIUM CHLORIDE (POUR BTL) OPTIME
TOPICAL | Status: DC | PRN
  Administered 2014-05-15: 1000 mL

## 2014-05-15 MED ORDER — ALBUTEROL SULFATE HFA 108 (90 BASE) MCG/ACT IN AERS
INHALATION_SPRAY | RESPIRATORY_TRACT | Status: DC | PRN
  Administered 2014-05-15: 4 via RESPIRATORY_TRACT
  Administered 2014-05-15: 2 via RESPIRATORY_TRACT
  Administered 2014-05-15: 4 via RESPIRATORY_TRACT

## 2014-05-15 MED ORDER — LACTATED RINGERS IV SOLN: INTRAVENOUS | Status: DC

## 2014-05-15 MED ORDER — SODIUM CHLORIDE 0.9 % IJ SOLN
3.0000 mL | Freq: Two times a day (BID) | INTRAMUSCULAR | Status: DC
  Administered 2014-05-16 – 2014-05-17 (×2): 3 mL via INTRAVENOUS
  Administered 2014-05-17: 10 mL via INTRAVENOUS
  Administered 2014-05-18 (×2): 3 mL via INTRAVENOUS

## 2014-05-15 MED ORDER — METOPROLOL TARTRATE 12.5 MG HALF TABLET
12.5000 mg | ORAL_TABLET | Freq: Two times a day (BID) | ORAL | Status: DC
  Filled 2014-05-15 (×3): qty 1

## 2014-05-15 MED ORDER — ROCURONIUM BROMIDE 50 MG/5ML IV SOLN
INTRAVENOUS | Status: AC
  Filled 2014-05-15: qty 1

## 2014-05-15 SURGICAL SUPPLY — 122 items
ADAPTER CARDIO PERF ANTE/RETRO (ADAPTER) ×5 IMPLANT
ADH SKN CLS APL DERMABOND .7 (GAUZE/BANDAGES/DRESSINGS) ×4
ADH SKN CLS LQ APL DERMABOND (GAUZE/BANDAGES/DRESSINGS) ×4
ADPR PRFSN 84XANTGRD RTRGD (ADAPTER) ×2
APL SKNCLS STERI-STRIP NONHPOA (GAUZE/BANDAGES/DRESSINGS)
APPLICATOR COTTON TIP 6IN STRL (MISCELLANEOUS) ×1 IMPLANT
ATTRACTOMAT 16X20 MAGNETIC DRP (DRAPES) ×2 IMPLANT
BAG DECANTER FOR FLEXI CONT (MISCELLANEOUS) ×6 IMPLANT
BENZOIN TINCTURE PRP APPL 2/3 (GAUZE/BANDAGES/DRESSINGS) ×4 IMPLANT
BLADE SURG 11 STRL SS (BLADE) ×5 IMPLANT
CANISTER SUCTION 2500CC (MISCELLANEOUS) ×9 IMPLANT
CANNULA BIO-MED ART (CANNULA) ×1 IMPLANT
CANNULA FEM VENOUS REMOTE 22FR (CANNULA) ×1 IMPLANT
CANNULA FEMORAL ART 14 SM (MISCELLANEOUS) ×5 IMPLANT
CANNULA GUNDRY RCSP 15FR (MISCELLANEOUS) ×5 IMPLANT
CANNULA OPTISITE PERFUSION 16F (CANNULA) IMPLANT
CANNULA OPTISITE PERFUSION 18F (CANNULA) ×1 IMPLANT
CARDIOBLATE CARDIAC ABLATION (MISCELLANEOUS)
CELLS DAT CNTRL 66122 CELL SVR (MISCELLANEOUS) ×2 IMPLANT
CLAMP ISOLATOR SYNERGY LG (MISCELLANEOUS) ×1 IMPLANT
CONN ST 1/4X3/8  BEN (MISCELLANEOUS) ×2
CONN ST 1/4X3/8 BEN (MISCELLANEOUS) ×8 IMPLANT
CONNECTOR 1/2X3/8X1/2 3 WAY (MISCELLANEOUS) ×1
CONNECTOR 1/2X3/8X1/2 3WAY (MISCELLANEOUS) IMPLANT
CONT SPEC 4OZ CLIKSEAL STRL BL (MISCELLANEOUS) ×1 IMPLANT
CONT SPEC STER OR (MISCELLANEOUS) ×2 IMPLANT
COVER BACK TABLE 24X17X13 BIG (DRAPES) ×3 IMPLANT
COVER MAYO STAND STRL (DRAPES) ×4 IMPLANT
COVER PROBE W GEL 5X96 (DRAPES) ×1 IMPLANT
COVER SURGICAL LIGHT HANDLE (MISCELLANEOUS) ×5 IMPLANT
CRADLE DONUT ADULT HEAD (MISCELLANEOUS) ×5 IMPLANT
DERMABOND ADHESIVE PROPEN (GAUZE/BANDAGES/DRESSINGS) ×2
DERMABOND ADVANCED (GAUZE/BANDAGES/DRESSINGS) ×2
DERMABOND ADVANCED .7 DNX12 (GAUZE/BANDAGES/DRESSINGS) ×8 IMPLANT
DERMABOND ADVANCED .7 DNX6 (GAUZE/BANDAGES/DRESSINGS) IMPLANT
DEVICE CARDIOBLATE CARDIAC ABL (MISCELLANEOUS) IMPLANT
DEVICE SUT CK QUICK LOAD INDV (Prosthesis & Implant Heart) ×3 IMPLANT
DEVICE SUT CK QUICK LOAD MINI (Prosthesis & Implant Heart) ×3 IMPLANT
DEVICE TROCAR PUNCTURE CLOSURE (ENDOMECHANICALS) ×5 IMPLANT
DRAIN CHANNEL 28F RND 3/8 FF (WOUND CARE) ×10 IMPLANT
DRAPE BILATERAL SPLIT (DRAPES) ×5 IMPLANT
DRAPE C-ARM 42X72 X-RAY (DRAPES) ×5 IMPLANT
DRAPE CV SPLIT W-CLR ANES SCRN (DRAPES) ×5 IMPLANT
DRAPE INCISE IOBAN 66X45 STRL (DRAPES) ×10 IMPLANT
DRAPE SLUSH/WARMER DISC (DRAPES) ×5 IMPLANT
DRSG COVADERM 4X10 (GAUZE/BANDAGES/DRESSINGS) ×1 IMPLANT
DRSG COVADERM 4X8 (GAUZE/BANDAGES/DRESSINGS) ×5 IMPLANT
ELECT BLADE 6.5 EXT (BLADE) ×5 IMPLANT
ELECT REM PT RETURN 9FT ADLT (ELECTROSURGICAL) ×6
ELECTRODE REM PT RTRN 9FT ADLT (ELECTROSURGICAL) ×8 IMPLANT
FEMORAL VENOUS CANN RAP (CANNULA) IMPLANT
GAUZE SPONGE 4X4 12PLY STRL (GAUZE/BANDAGES/DRESSINGS) ×5 IMPLANT
GLOVE BIOGEL M 6.5 STRL (GLOVE) ×4 IMPLANT
GLOVE BIOGEL PI IND STRL 7.0 (GLOVE) IMPLANT
GLOVE BIOGEL PI INDICATOR 7.0 (GLOVE) ×7
GLOVE ORTHO TXT STRL SZ7.5 (GLOVE) ×16 IMPLANT
GOWN STRL REUS W/ TWL LRG LVL3 (GOWN DISPOSABLE) ×16 IMPLANT
GOWN STRL REUS W/TWL LRG LVL3 (GOWN DISPOSABLE) ×24
GUIDEWIRE ANG ZIPWIRE 038X150 (WIRE) ×5 IMPLANT
INSERT CONFORM CROSS CLAMP 66M (MISCELLANEOUS) ×2 IMPLANT
INSERT CONFORM CROSS CLAMP 86M (MISCELLANEOUS) ×2 IMPLANT
KIT BASIN OR (CUSTOM PROCEDURE TRAY) ×5 IMPLANT
KIT DEVICE SUT COR-KNOT MIS 5 (INSTRUMENTS) ×3 IMPLANT
KIT DILATOR VASC 18G NDL (KITS) ×5 IMPLANT
KIT ROOM TURNOVER OR (KITS) ×5 IMPLANT
KIT SUCTION CATH 14FR (SUCTIONS) ×5 IMPLANT
LEAD PACING MYOCARDI (MISCELLANEOUS) ×5 IMPLANT
NDL AORTIC ROOT 14G 7F (CATHETERS) ×4 IMPLANT
NEEDLE AORTIC ROOT 14G 7F (CATHETERS) ×3 IMPLANT
NS IRRIG 1000ML POUR BTL (IV SOLUTION) ×26 IMPLANT
PACK OPEN HEART (CUSTOM PROCEDURE TRAY) ×5 IMPLANT
PAD ARMBOARD 7.5X6 YLW CONV (MISCELLANEOUS) ×10 IMPLANT
PAD ELECT DEFIB RADIOL ZOLL (MISCELLANEOUS) ×5 IMPLANT
PATCH CORMATRIX 4CMX7CM (Prosthesis & Implant Heart) ×1 IMPLANT
PROBE CRYO2-ABLATION MALLABLE (MISCELLANEOUS) ×1 IMPLANT
RETRACTOR TRL SOFT TISSUE LG (INSTRUMENTS) IMPLANT
RETRACTOR TRM SOFT TISSUE 7.5 (INSTRUMENTS) IMPLANT
RETRACTOR WND ALEXIS 18 MED (MISCELLANEOUS) IMPLANT
RTRCTR WOUND ALEXIS 18CM MED (MISCELLANEOUS) ×3
SET CANNULATION TOURNIQUET (MISCELLANEOUS) ×5 IMPLANT
SET IRRIG TUBING LAPAROSCOPIC (IRRIGATION / IRRIGATOR) ×5 IMPLANT
SOLUTION ANTI FOG 6CC (MISCELLANEOUS) ×5 IMPLANT
SPONGE GAUZE 4X4 12PLY STER LF (GAUZE/BANDAGES/DRESSINGS) ×1 IMPLANT
SUCKER WEIGHTED FLEX (MISCELLANEOUS) ×10 IMPLANT
SUT BONE WAX W31G (SUTURE) ×5 IMPLANT
SUT E-PACK MINIMALLY INVASIVE (SUTURE) ×5 IMPLANT
SUT ETHIBON 2 0 V 52N 30 (SUTURE) ×1 IMPLANT
SUT ETHIBOND (SUTURE) IMPLANT
SUT ETHIBOND 2 0 SH (SUTURE) IMPLANT
SUT ETHIBOND 2 0 V4 (SUTURE) IMPLANT
SUT ETHIBOND 2 0V4 GREEN (SUTURE) IMPLANT
SUT ETHIBOND 2-0 RB-1 WHT (SUTURE) IMPLANT
SUT ETHIBOND 4 0 TF (SUTURE) IMPLANT
SUT ETHIBOND 5 0 C 1 30 (SUTURE) IMPLANT
SUT ETHIBOND NAB MH 2-0 36IN (SUTURE) IMPLANT
SUT ETHIBOND X763 2 0 SH 1 (SUTURE) ×5 IMPLANT
SUT GORETEX 6.0 TH-9 30 IN (SUTURE) IMPLANT
SUT GORETEX CV 4 TH 22 36 (SUTURE) ×5 IMPLANT
SUT GORETEX CV-5THC-13 36IN (SUTURE) IMPLANT
SUT GORETEX CV4 TH-18 (SUTURE) ×10 IMPLANT
SUT GORETEX TH-18 36 INCH (SUTURE) IMPLANT
SUT PROLENE 4 0 RB 1 (SUTURE) ×9
SUT PROLENE 4-0 RB1 .5 CRCL 36 (SUTURE) IMPLANT
SUT PROLENE 5 0 C 1 36 (SUTURE) ×1 IMPLANT
SUT PROLENE 5 0 C1 (SUTURE) ×1 IMPLANT
SUT PROLENE 6 0 C 1 30 (SUTURE) ×2 IMPLANT
SUT VIC AB 3-0 SH 8-18 (SUTURE) ×1 IMPLANT
SYRINGE 10CC LL (SYRINGE) ×4 IMPLANT
SYS ARTICLIP LAA EXCLUSION 145 (Clip) ×1 IMPLANT
SYS ATRICLIP LAA EXCLUSION 45 (CLIP) IMPLANT
SYSTEM SAHARA CHEST DRAIN ATS (WOUND CARE) ×9 IMPLANT
TAPE CLOTH SURG 4X10 WHT LF (GAUZE/BANDAGES/DRESSINGS) ×1 IMPLANT
TOWEL OR 17X24 6PK STRL BLUE (TOWEL DISPOSABLE) ×8 IMPLANT
TOWEL OR 17X26 10 PK STRL BLUE (TOWEL DISPOSABLE) ×8 IMPLANT
TRAY FOLEY IC TEMP SENS 16FR (CATHETERS) ×5 IMPLANT
TROCAR XCEL BLADELESS 5X75MML (TROCAR) ×5 IMPLANT
TROCAR XCEL NON-BLD 11X100MML (ENDOMECHANICALS) ×10 IMPLANT
TUNNELER SHEATH ON-Q 11GX8 DSP (PAIN MANAGEMENT) IMPLANT
UNDERPAD 30X30 INCONTINENT (UNDERPADS AND DIAPERS) ×5 IMPLANT
VALVE MITRAL 33MM (Prosthesis & Implant Heart) ×1 IMPLANT
WATER STERILE IRR 1000ML POUR (IV SOLUTION) ×10 IMPLANT
WIRE BENTSON .035X145CM (WIRE) ×5 IMPLANT

## 2014-05-15 NOTE — Brief Op Note (Addendum)
05/15/2014  12:39 PM  PATIENT:  Kevin English  59 y.o. male  PRE-OPERATIVE DIAGNOSIS:  MITRAL STENOSIS  POST-OPERATIVE DIAGNOSIS:  MITRAL STENOSIS  PROCEDURE:  Procedure(s):  MINIMALLY INVASIVE MITRAL VALVE (MV) REPLACEMENT (Right) -33 mm Sorin [employer] Optiform Mechanical Valve  MINIMALLY INVASIVE MAZE PROCEDURE (N/A) -Complete Left Sided Lesion Set utilizing Cryothermy -Clipping of LA Appendage  TRANSESOPHAGEAL ECHOCARDIOGRAM (TEE) (N/A)  SURGEON:    Rexene Alberts, MD  ASSISTANTS:  Ellwood Handler, PA-C  ANESTHESIA:   Midge Minium, MD  CROSSCLAMP TIME:   115'  CARDIOPULMONARY BYPASS TIME: 167'  FINDINGS:  Rheumatic mitral valve disease with severe mitral stenosis  Normal LV systolic function   Mitral Valve Etiology  MV Insufficiency: Trace  MV Disease: Yes.  MV Stenosis: Yes. Smallest Valve Area: 0.73cm2. Highest Mean Gradient: 24mmHg.  MV Disease Functional Class: MV Disease Functional Class: Type IIIa.   Etiology (Choose at least one and up to five): Rheumatic.  MV Lesions (Choose at least one): Leaflet thickening/retraction.    Mitral/Tricuspid/Pulmonary Valve Procedure  Mitral Valve Procedure Performed:  Replacement: Both Anterior and Posterior Mitral Chords Preserved.  Implant: Mechanical Valve: Implant model number F7-033, Size 33, Unique Device Identifier [id number].  Maze Procedure  Radiofrequency:  No.  Cut-and-sew:  No.  Cryo: Yes  Lesions (select all that apply):    1   Pulmonary Vein Isolation,    2   Box Lesion,   3a  Inferior Pulmonary Vein Connecting Lesion,   3b  Superior Pulmonary Vein Connecting Lesion,     4  Posterior Mirtal Annular Line,     6  Mitral Valve Cryo Lesion and     7  LAA Ligation/Removal       COMPLICATIONS: None  BASELINE WEIGHT: 92 kg  PATIENT DISPOSITION:   TO SICU IN STABLE CONDITION  OWEN,CLARENCE H 05/15/2014 2:11 PM

## 2014-05-15 NOTE — Op Note (Signed)
CARDIOTHORACIC SURGERY OPERATIVE NOTE  Date of Procedure:   05/15/2014  Preoperative Diagnosis:    Severe Mitral Stenosis  Recurrent Paroxysmal Atrial Fibrillation  Postoperative Diagnosis: Same  Procedure:    Minimally-Invasive Mitral Valve Replacement  Sorin [employer] Optiform bileaflet mechanical valve (size 26mm, catalog #[id number], serial #[id number])   Maze Procedure  Left atrial lesion set using cryothermy  Clipping of Left Atrial Appendage (Atricure left atrial clip, size 45 mm)  Surgeon: Valentina Gu. 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 The patient states that he first began to experience severe chronic fatigue with exertional shortness of breath and intermittent dizzy spells more than 4 years ago. He underwent an echocardiogram and was evaluated by a cardiologist in [place] at that time. He was told that there did not appear to be anything wrong. Symptoms progressed and the patient states he was forced to quit his job because of severe fatigue. Since then he has continued to experience severe chronic fatigue. He was diagnosed with obstructive sleep apnea and uses CPAP at night. This has not substantially improved his symptoms. He was referred to Dr. [name] for cardiology consultation in September of 2014. His primary complaint at that time remained severe fatigue and exhaustion. He was started on Synthroid for hypothyroidism. Symptoms persisted, and more recently he began to experience  tachypalpitations associated with dizzy spells and severe fatigue. He was seen in followup by Dr [name] last spring and an echocardiogram and 30 day monitor were obtained. The patient's event monitor documented recurrent episodes of atrial fibrillation with rapid ventricular rate. Transthoracic echocardiogram revealed likely rheumatic heart disease with severe mitral stenosis and normal left ventricular systolic function. The patient was hospitalized last May with an acute exacerbation of symptoms of shortness of breath and palpitations, at which time he was found to be in atrial flutter with 2:1 block. I had the opportunity to see him in consultation at that time. He subsequently converted back to sinus rhythm on amiodarone. He was seen in followup on 08/24/2013 and plans were made to proceed with minimally invasive mitral valve repair or replacement and Maze procedure later that month. However, CT angiogram performed 08/25/2013 revealed a mass in the right kidney suspicious for renal cell carcinoma. Because the patient remained in sinus rhythm and very stable from a cardiac standpoint, plans for cardiac surgery were postponed. The patient subsequently underwent robotic-assisted right radical nephrectomy on 09/05/2013 by Dr. Rolan Bucco. Final pathology was consistent with clear cell renal cell carcinoma, Fuhrman grade 2-3, with maximum tumor diameter 2.7 cm, all tumor to find confined to the kidney, and all surgical margins negative (T1aN0). His postoperative recovery was uncomplicated, and his serum creatinine level has stabilized at 2.0. Postoperatively the patient did not experience any problems with recurrent paroxysmal atrial fibrillation or congestive heart failure, and he has been followed in the office on several occasions by Dr. [name]. He returns with hope to proceed with mitral valve repair or replacement and Maze procedure. He states that over the last few months he has continued to feel  better  as time progresses. He still gets short of breath and tired quite easily with any physical exertion. However, his energy level has improved and he feels as though he is finally recovered completely from his nephrectomy. He has not had any palpitations no other signs to suggest a recurrence of atrial fibrillation. The remainder of his review of systems is unremarkable.  The patient has been seen in consultation and counseled at length regarding the indications, risks and potential benefits of surgery.  All questions have been answered, and the patient provides full informed consent for the operation as described.     DETAILS OF THE OPERATIVE PROCEDURE  Preparation:  The patient is brought to the operating room on the above mentioned date and central monitoring was established by the anesthesia team including placement of Swan-Ganz catheter through the left internal jugular vein.  A radial arterial line is placed. The patient is placed in the supine position on the operating table.  Intravenous antibiotics are administered. General endotracheal anesthesia is induced uneventfully. The patient is initially intubated using a dual lumen endotracheal tube.  A Foley catheter is placed.  Baseline transesophageal echocardiogram was performed.  Findings were notable for classical rheumatic mitral stenosis.  There was mild mitral regurgitation and mild aortic insufficiency.  LV systolic function appeared normal.  A soft roll is placed behind the patient's left scapula and the neck gently extended and turned to the left.   The patient's right neck, chest, abdomen, both groins, and both lower extremities are prepared and draped in a sterile manner. A time out procedure is performed.   Surgical Approach:  A right miniature anterolateral thoracotomy incision is performed. The incision is placed just lateral to and superior to the right nipple. The pectoralis major muscle is retracted medially and  completely preserved. The right pleural space is entered through the 3rd intercostal space. A soft tissue retractor is placed.  Two 11 mm ports are placed through separate stab incisions inferiorly. The right pleural space is insufflated continuously with carbon dioxide gas through the posterior port during the remainder of the operation.  A pledgeted sutures placed through the dome of the right hemidiaphragm and retracted inferiorly to facilitate exposure.  A longitudinal incision is made in the pericardium 3 cm anterior to the phrenic nerve and silk traction sutures are placed on either side of the incision for exposure.   Extracorporeal Cardiopulmonary Bypass and Myocardial Protection:  A small incision is made in the right inguinal crease and the anterior surface of the right common femoral artery and right common femoral vein are identified.  The patient is placed in Trendelenburg position. The right internal jugular vein is cannulated with Seldinger technique and a guidewire advanced into the right atrium. The patient is heparinized systemically. The right internal jugular vein is cannulated with a 14 [place] pediatric femoral venous cannula. Pursestring sutures are placed on the anterior surface of the right common femoral vein and right common femoral artery. The right common femoral vein is cannulated with the Seldinger technique and a guidewire is advanced under transesophageal echocardiogram guidance through the right atrium. The femoral vein is cannulated with a long 22 French femoral venous cannula. The right common femoral artery is cannulated with Seldinger technique and a flexible guidewire is advanced until it can be appreciated intraluminally in the descending thoracic aorta on transesophageal echocardiogram. The femoral artery is cannulated with an 18 French femoral arterial cannula.  Adequate heparinization is verified.      The entire pre-bypass  portion of the operation was notable for  stable hemodynamics.  Cardiopulmonary bypass was begun.  Vacuum assist venous drainage is utilized. The incision in the pericardium is extended in both directions. Venous drainage and exposure are notably excellent.   A retrograde cardioplegia cannula is placed through the right atrium into the coronary sinus using transesophageal echocardiogram guidance.  An antegrade cardioplegia cannula is placed in the ascending aorta.  The patient is cooled to 28C systemic temperature.  The aortic cross clamp is applied and cold blood cardioplegia is delivered initially in an antegrade fashion through the aortic root.  Supplemental cardioplegia is given retrograde through the coronary sinus catheter. The initial cardioplegic arrest is rapid with early diastolic arrest.  Repeat doses of cardioplegia are administered intermittently every 20 to 30 minutes throughout the entire cross clamp portion of the operation through the aortic root and through the coronary sinus catheter in order to maintain completely flat electrocardiogram.  Myocardial protection was felt to be excellent.   Maze Procedure (left atrial lesion set):  The left atrial appendage is obliterated using an Atricure left atrial appendage clip (Atriclip, size 18mm).  The clip is applied under thoracoscopic visualization posterior to the aorta and anterior to the pulmonary artery through the transverse sinus, prior to application of the aortic crossclamp, with transesophageal echocardiographic confirmation that the clip satisfactorily obliterates the appendage.  Following placement of the aortic crossclamp and the administration of the initial arresting dose of cardioplegia, a left atriotomy incision was performed through the interatrial groove and extended partially across the back wall of the left atrium after opening the oblique sinus inferiorly.  The mitral valve and floor of the left atrium are exposed using a self-retaining retractor.    The  Atricure CryoICE nitrous oxide cryothermy system is utilized for all cryothermy ablation lesions.  The left atrial lesion set of the Cox cryomaze procedure is now performed using 3 minute duration for all cryothermy lesions.  Initially a lesion is placed along the endocardial surface of the left atrium from the caudad apex of the atriotomy incision across the posterior wall of the left atrium onto the posterior mitral annulus.  A mirror image lesion along the epicardial surface is then performed with the probe posterior to the left atrium, crossing over the coronary sinus.  Two lesions are then performed to create a box isolating all of the pulmonary veins from the remainder of the left atrium.  The first lesion is placed from the cephalad apex of the atriotomy incision across the dome of the left atrium to just anterior to the left sided pulmonary veins.  The second lesion completes the box from the caudad apex of the atriotomy incision across the back wall of the left atrium to connect with the previous lesion just anterior to the left sided pulmonary veins.     Mitral Valve Replacement:  The mitral valve was inspected and notable for classical rheumatic mitral stenosis.  There was severe thickening and restriction of both leaflets with severe calcification of both commissures.  An attempt at mitral commissurotomy is felt not likely to provide a long-term adequate result with concerns for the development of severe mitral regurgitation and/or recurrent stenosis.  There was moderate thickening and foreshortening of the subvalvular apparatus.  The anterior mitral valve leaflet is resected nearly completely, leaving 2 small panels from the free margin of the leaflet in order to preserve chordae tendineae from both the anterior and posterior papillary muscles.  The posterior mitral valve leaflet  is split in the midline and debulked. Therefore, subvalvular apparatus was preserved to both the anterior and  posterior leaflets.  Mitral valve replacement is performed using interrupted 2-0 Ethibond horizontal mattress pledgeted sutures with pledgets in the supra-annular position. The mitral annulus is sized to accept a 33 mm bileaflet mechanical valve. Mitral valve replacement is performed with a [employer] Optiform bileaflet mechanical valve (size 10mm, catalog [id number], serial [id number] ).  The 2 panels from the anterior leaflet with associated chordae tendineae are incorporated into the suture line laterally. The valve is seated uneventfully and all sutures secured using the Cor-knot device.  Once valve replacement has been completed, the valve was tested to make certain both leaflets open and close normally without any degree of impingement from the subvalvular apparatus.  The valve is tested with saline and appears to be functioning normally. There is no paravalvular leak. Rewarming is begun.  The atriotomy was closed using a 2-layer closure of running 3-0 Prolene suture after placing a sump drain across the mitral valve to serve as a left ventricular vent.  One final dose of warm retrograde "hot shot" cardioplegia was administered retrograde through the coronary sinus catheter while all air was evacuated through the aortic root.  The aortic cross clamp was removed after a total cross clamp time of 115 minutes.   Procedure Completion:  The retrograde cardioplegia cannula was removed.  Epicardial pacing wires are fixed to the inferior wall of the right ventricule and to the right atrial appendage. The patient is rewarmed to 37C temperature. The left ventricular vent is removed.  The patient is ventilated and flow volumes turndown while the mitral valve repair is inspected using transesophageal echocardiogram. The valve repair appears intact with no residual leak. The antegrade cardioplegia cannula is now removed. The patient is weaned and disconnected from cardiopulmonary bypass.  The patient's  rhythm at separation from bypass was AV paced.  The patient was weaned from bypass without any inotropic support. Total cardiopulmonary bypass time for the operation was 167 minutes.  Low dose dopamine was added for renal perfusion.  Followup transesophageal echocardiogram performed after separation from bypass revealed a well-seated bileaflet mechanical valve in the mitral position that is functioning normally. There was no paravalvular leak.  Left ventricular function was unchanged from preoperatively.  The femoral arterial and venous cannulae were removed uneventfully. There was a palpable pulse in the distal right common femoral artery after removal of the cannula. Protamine was administered to reverse the anticoagulation. The right internal jugular cannula was removed and manual pressure held on the neck for 15 minutes.  Single lung ventilation was begun. The atriotomy closure was inspected for hemostasis. The pericardial sac was drained using a 28 French Bard drain placed through the anterior port incision.  The pericardium was closed using a patch of core matrix bovine submucosal tissue patch. The right pleural space is irrigated with saline solution and inspected for hemostasis. The right pleural space was drained using a 28 French Bard drain placed through the posterior port incision. The miniature thoracotomy incision was closed in multiple layers in routine fashion. The right groin incision was inspected for hemostasis and closed in multiple layers in routine fashion.  The post-bypass portion of the operation was notable for stable rhythm and hemodynamics.  No blood products were administered during the operation.   Disposition:  The patient tolerated the procedure well.  The patient was reintubated using a single lumen endotracheal tube and subsequently transported to the surgical intensive  care unit in stable condition. There were no intraoperative complications. All sponge instrument and  needle counts are verified correct at completion of the operation.    Valentina Gu. Roxy Manns MD 05/15/2014 2:13 PM

## 2014-05-15 NOTE — Progress Notes (Signed)
S/p mitral repair/ maze  Intubated, sedated  BP 110/68 mmHg  Pulse 91  Temp(Src) 97.3 F (36.3 C) (Core (Comment))  Resp 18  Wt 202 lb (91.627 kg)  SpO2 99%  CO= 4.4   Intake/Output Summary (Last 24 hours) at 05/15/14 1836 Last data filed at 05/15/14 1830  Gross per 24 hour  Intake 5467.45 ml  Output   2785 ml  Net 2682.45 ml    Currently 100% sat on 70% FiO2  Wean vent as tolerated

## 2014-05-15 NOTE — Progress Notes (Signed)
  Echocardiogram Echocardiogram Transesophageal has been performed.  Bobbye Charleston 05/15/2014, 9:14 AM

## 2014-05-15 NOTE — Progress Notes (Signed)
RT note- decreased fio2 to 70%, sp02 100%, with Dr.Owen rounding.

## 2014-05-15 NOTE — Transfer of Care (Signed)
Immediate Anesthesia Transfer of Care Note  Patient: Kevin English  Procedure(s) Performed: Procedure(s): MINIMALLY INVASIVE MITRAL VALVE (MV) REPLACEMENT (Right) MINIMALLY INVASIVE MAZE PROCEDURE (N/A) TRANSESOPHAGEAL ECHOCARDIOGRAM (TEE) (N/A)  Patient Location: SICU  Anesthesia Type:General  Level of Consciousness: sedated and Patient remains intubated per anesthesia plan  Airway & Oxygen Therapy: Patient remains intubated per anesthesia plan and Patient placed on Ventilator (see vital sign flow sheet for setting)  Post-op Assessment: Report given to RN and Post -op Vital signs reviewed and stable  Post vital signs: Reviewed and stable  Last Vitals:  BP 104/62 HR 80 SpO2 55%  Complications: No apparent anesthesia complications

## 2014-05-15 NOTE — H&P (Signed)
[address]       [place] [address]             [phone]          CARDIOTHORACIC SURGERY HISTORY AND PHYSICAL EXAM  PCP is GRISSO,GREG, MD Referring Provider is Ena Dawley, MD   Reason for consultation: mitral stenosis    HPI:  Patient is a 59 year old white male from 70 with recently discovered rheumatic mitral stenosis associated with recurrent paroxysmal atrial fibrillation and acute exacerbation of chronic diastolic congestive heart failure. The patient states that he first began to experience severe chronic fatigue with exertional shortness of breath and intermittent dizzy spells more than 4 years ago. He underwent an echocardiogram and was evaluated by a cardiologist in [place] at that time. He was told that there did not appear to be anything wrong. Symptoms progressed and the patient states he was forced to quit his job because of severe fatigue. Since then he has continued to experience severe chronic fatigue. He was diagnosed with obstructive sleep apnea and uses CPAP at night. This has not substantially improved his symptoms. He was referred to Dr. [name] for cardiology consultation in September of 2014. His primary complaint at that time remained severe fatigue and exhaustion. He was started on Synthroid for hypothyroidism. Symptoms persisted, and more recently he began to experience tachypalpitations associated with dizzy spells and severe fatigue. He was seen in followup by Dr [name] last spring and an echocardiogram and 30 day monitor were obtained. The patient's event monitor documented recurrent episodes of atrial fibrillation with rapid ventricular rate. Transthoracic echocardiogram revealed likely rheumatic heart disease with severe mitral stenosis and normal left ventricular systolic function. The patient was hospitalized last May with an acute exacerbation of symptoms of shortness of breath and palpitations, at which time he  was found to be in atrial flutter with 2:1 block. I had the opportunity to see him in consultation at that time. He subsequently converted back to sinus rhythm on amiodarone. He was seen in followup on 08/24/2013 and plans were made to proceed with minimally invasive mitral valve repair or replacement and Maze procedure later that month. However, CT angiogram performed 08/25/2013 revealed a mass in the right kidney suspicious for renal cell carcinoma. Because the patient remained in sinus rhythm and very stable from a cardiac standpoint, plans for cardiac surgery were postponed. The patient subsequently underwent robotic-assisted right radical nephrectomy on 09/05/2013 by Dr. Rolan Bucco. Final pathology was consistent with clear cell renal cell carcinoma, Fuhrman grade 2-3, with maximum tumor diameter 2.7 cm, all tumor to find confined to the kidney, and all surgical margins negative (T1aN0). His postoperative recovery was uncomplicated, and his serum creatinine level has stabilized at 2.0. Postoperatively the patient did not experience any problems with recurrent paroxysmal atrial fibrillation or congestive heart failure, and he has been followed in the office on several occasions by Dr. [name]. He returns with hope to proceed with mitral valve repair or replacement and Maze procedure. He states that over the last few months he has continued to feel better as time progresses. He still gets short of breath and tired quite easily with any physical exertion. However, his energy level has improved and he feels as though he is finally recovered completely from his nephrectomy. He has not had any palpitations no other signs to suggest a recurrence of atrial fibrillation. The remainder of his review of systems is unremarkable.  The patient was recently  scheduled for minimally invasive mitral valve replacement and Maze procedure on 04/19/2014. However, when the patient presented for surgery he was found to  have a new severe rash in both groins related to cutaneous yeast infection. Surgery was postponed and the patient was given a prescription for topical antifungal ointment. He returns to the office today for follow-up. The patient states that the rash resolved quickly with the topical cream and he otherwise feels well. He hopes to reschedule surgery as soon as possible.     Past Medical History  Diagnosis Date  . Hernia   . Chronic rhinitis   . Diverticulitis of colon 15 years ago  . Hyperlipidemia   . Hypogonadism male   . Leukocytosis   . Myalgia and myositis, unspecified   . Obesity   . Hypothyroidism   . PAF (paroxysmal atrial fibrillation)     a. sustained 160-180 on e-CARDIO monitor placed 07/29/2013. Placed on IV amiodarone at end of May 2015 due to continued paroxysms with RVR.  Marland Kitchen Rheumatic heart disease   . Chronic diastolic CHF (congestive heart failure)     a. Occurs in context of AF-RVR with mitral stenosis.  . Mitral stenosis     a. Dx 07/2013 - through workup of 2D echo, TEE, and cath, felt to be moderate.  . Paroxysmal atrial flutter   . CAD (coronary artery disease)     a. By cath 08/2013: 70% distal cx-prox LPDA; tandem mod LAD lesions, o/w mild dz.  . Renal mass     a. Dx 08/2013: concerning for renal cancer.  Marland Kitchen HTN (hypertension)     hx of- under control  . Stroke 1998    "MRI showed 3 mild strokes"  . Borderline diabetes   . Depression   . Anxiety   . Dizzy spells   . Deafness in left ear   . Mitral stenosis 08/02/2013  . Lung nodule seen on imaging study 04/10/2014    5 mm nodule right lower lobe  . Dysrhythmia     atrial fib  . OSA on CPAP     cpap  . Shortness of breath dyspnea     exertion  . CKD (chronic kidney disease), stage II     his creat. runs 2-2.5; hasnt seen neph yet - sees dr. Tresa [facility]  . Renal cell carcinoma of right kidney 09/05/2013    clear cell renal cell carcinoma of right kidney treated by radical nephrectomy, Fuhrman grade  2-3, with maximum tumor diameter 2.7 cm, all tumor to find confined to the kidney, and all surgical margins negative (T1aN0).   . Yeast dermatitis 04/19/2014  . Atrial fibrillation     Recurrent persistent atrial fibrillation  . History of shingles     Past Surgical History  Procedure Laterality Date  . Tee without cardioversion N/A 08/16/2013    Procedure: TRANSESOPHAGEAL ECHOCARDIOGRAM (TEE);  Surgeon: Dorothy Spark, MD;  Location: [facility];  Service: Cardiovascular;  Laterality: N/A;  . Inguinal hernia repair Bilateral first one in 80's    "I had one side done twice"  . Cardiac catheterization  08/28/13  . Eye surgery Right 1990    "reconstructive, lens implant- from paintball accident"  . Robot assisted laparoscopic nephrectomy Right 09/05/2013    Procedure: ROBOTIC ASSISTED LAPAROSCOPIC RIGHT RADICAL NEPHRECTOMY;  Surgeon: Sharyn Creamer, MD;  Location: [facility];  Service: Urology;  Laterality: Right;  . Left and right heart catheterization with coronary angiogram N/A 08/28/2013    Procedure:  LEFT AND RIGHT HEART CATHETERIZATION WITH CORONARY ANGIOGRAM;  Surgeon: Leonie Man, MD;  Location: [facility];  Service: Cardiovascular;  Laterality: N/A;    Family History  Problem Relation Age of Onset  . Hypertension    . Hypertension Mother     Social History History  Substance Use Topics  . Smoking status: Current Every Day Smoker -- 0.25 packs/day for 40 years    Types: Cigarettes  . Smokeless tobacco: Never Used  . Alcohol Use: No    Prior to Admission medications   Medication Sig Start Date End Date Taking? Authorizing Provider  acetaminophen (TYLENOL) 325 MG tablet Take 325 mg by mouth every 6 (six) hours as needed (pain).    Yes Historical Provider, MD  ALPRAZolam Duanne Moron) 0.5 MG tablet Take 0.5-1 mg by mouth at bedtime as needed for anxiety.    Yes Historical Provider, MD  amiodarone (PACERONE) 200 MG tablet Take 1 tablet (200 mg total) by mouth every morning.  02/01/14  Yes Dorothy Spark, MD  amLODipine (NORVASC) 10 MG tablet Take 1 tablet (10 mg total) by mouth 2 (two) times daily. 01/16/14  Yes Dorothy Spark, MD  levothyroxine (SYNTHROID) 75 MCG tablet Take 1 tablet (75 mcg total) by mouth daily before breakfast. 09/21/13  Yes Dorothy Spark, MD  nystatin-triamcinolone (MYCOLOG II) cream Apply 1 application topically 2 (two) times daily. 05/07/14  Yes Rexene Alberts, MD  sertraline (ZOLOFT) 50 MG tablet Take 1 tablet (50 mg total) by mouth daily. 03/20/14  Yes Dorothy Spark, MD  warfarin (COUMADIN) 5 MG tablet Take 2.5-5 mg by mouth daily. Sun Tues Thurs 2.5 mg. , and Mon Wed Fri and Sat 5 mg   Yes Historical Provider, MD  nystatin-triamcinolone (MYCOLOG II) cream APPLY TO AFFECTED AREA TWICE A DAY 05/08/14   Rexene Alberts, MD    Allergies  Allergen Reactions  . Contrast Media [Iodinated Diagnostic Agents] Hives and Other (See Comments)    Patient started sneezing and coughing, patient broke out in hives, patient needs 13 hour prep if having IV contrast  . Atorvastatin Other (See Comments)    Myalgias  . Fenofibrate Other (See Comments)    MYALGIAS   . Pravastatin Other (See Comments)    Myalgias       Review of Systems:  General:normal appetite, decreased energy, no weight gain, no weight loss, no fever Cardiac:no chest pain with exertion, + chest pain at rest associated with tachypalpitations, + SOB with exertion, no resting SOB, no PND, no orthopnea, + palpitations, + arrhythmia, + atrial fibrillation, no LE edema, + dizzy spells, no syncope Respiratory:+ shortness of breath, no home oxygen, no productive cough, + chronic dry cough, no bronchitis, no wheezing, no hemoptysis, no asthma, no pain with inspiration or cough, + sleep apnea, + CPAP at night GI:no difficulty swallowing, no  reflux, no frequent heartburn, no hiatal hernia, no abdominal pain, + occasional constipation, no diarrhea, no hematochezia, no hematemesis, no melena GU:no dysuria, no frequency, no urinary tract infection, no hematuria, no enlarged prostate, no kidney stones, no kidney disease Vascular:no pain suggestive of claudication, no pain in feet, no leg cramps, no varicose veins, no DVT, no non-healing foot ulcer Neuro:no stroke, + TIA in the remote past, no seizures, no headaches, no temporary blindness one eye, no slurred speech, no peripheral neuropathy, no chronic pain, no instability of gait, no memory/cognitive dysfunction Musculoskeletal:no arthritis, no joint swelling, no myalgias, no difficulty walking, normal mobility  Skin:no  rash, no itching, no skin infections, no pressure sores or ulcerations Psych:no anxiety, + depression, no nervousness, no unusual recent stress Eyes:no blurry vision, no floaters, no recent vision changes, + wears glasses or contacts ENT:no hearing loss, no loose or painful teeth, no dentures, last saw dentist within the past year Hematologic:no easy bruising, no abnormal bleeding, no clotting disorder, no frequent epistaxis Endocrine:no diabetes, does not check CBG's at home   Physical Exam:  BP 139/80  Pulse 62  Temp(Src) 97.8 F (36.6 C) (Oral)  Resp 18  Ht 5\' 8"  (1.727 m)  Wt 89.676 kg (197 lb 11.2 oz)  BMI 30.07 kg/m2  SpO2 99% General: well-appearing HEENT:Unremarkable   Neck:no JVD, no bruits, no adenopathy  Chest:clear to auscultation, symmetrical breath sounds, no wheezes, no rhonchi  CV:RRR, no murmur  Abdomen:soft, non-tender, no masses  Extremities:warm, well-perfused, pulses palpable, no lower extremity edema Rectal/GUDeferred Neuro:Grossly non-focal and symmetrical throughout Skin:Clean and dry, no rashes, no breakdown  Diagnostic Tests:  Transthoracic Echocardiography  Patient:  Rishith, Siddoway MR #:    [id number] Study Date: 07/20/2013 Gender:   M Age:    58 Height:   170.2cm Weight:   90.7kg BSA:    2.13m^2 Pt. Status: Room:  ATTENDING  Croitoru, Mihai SONOGRAPHER [place], [facility]   Ena Dawley, M.D. REFERRING  Ena Dawley, M.D. PERFORMING  [facility], Outpatient cc:  ------------------------------------------------------------ LV EF: 55% -  60%  ------------------------------------------------------------ Indications:   Fatigue/malaise - chronic 780.71. Shortness of breath 786.05.  ------------------------------------------------------------ History:  PMH: Acquired from the patient and from the patient's chart. Fatigue and dyspnea. Risk factors: Current tobacco use. Hypertension. Dyslipidemia.  ------------------------------------------------------------ Study Conclusions  - Left ventricle: The cavity size was normal. Systolic function was normal. The estimated ejection fraction was in the range of 55% to 60%. Wall motion was normal; there were no regional wall motion abnormalities. Doppler parameters are consistent with elevated mean left atrial filling pressure. - Aortic  valve: Mild regurgitation. - Mitral valve: Thickening, consistent with rheumatic disease. Mobility of the anterior leaflet was moderately restricted. The process was limited to the leaflet tips, and leaflet body motion was preserved.Diastolic leaflet doming was present. The findings are consistent with moderate stenosis. Valve area by pressure half-time: 1.26cm^2. Valve area by continuity equation (using LVOT flow): 0.98cm^2. - Left atrium: The atrium was moderately dilated. Transthoracic echocardiography. M-mode, complete 2D, spectral Doppler, and color Doppler. Height: Height: 170.2cm. Height: 67in. Weight: Weight: 90.7kg. Weight: 199.6lb. Body mass index: BMI: 31.3kg/m^2. Body surface area:  BSA: 2.42m^2. Blood pressure:   128/87. Patient status: Outpatient. Location: [facility]  ------------------------------------------------------------  ------------------------------------------------------------ Left ventricle: The cavity size was normal. Systolic function was normal. The estimated ejection fraction was in the range of 55% to 60%. Wall motion was normal; there were no regional wall motion abnormalities. Doppler parameters are consistent with elevated mean left atrial filling pressure.  ------------------------------------------------------------ Aortic valve:  Structurally normal valve. Trileaflet. Cusp separation was normal. Doppler: Transvalvular velocity was within the normal range. There was no stenosis. Mild regurgitation.  ------------------------------------------------------------ Mitral valve:  Thickening, consistent with rheumatic disease. Mobility of the anterior leaflet was moderately restricted. The process was limited to the leaflet tips, and leaflet body motion was preserved.Diastolic leaflet doming was present. (Heart rate 63 bpm) Doppler:  The findings are consistent with moderate stenosis.   Valve area  by pressure half-time: 1.26cm^2. Indexed valve area by pressure half-time: 0.62cm^2/m^2. Valve area by continuity equation (using LVOT flow): 0.98cm^2. Indexed valve area by  continuity equation (using LVOT flow): 0.49cm^2/m^2.  Mean gradient: 97mm Hg (D). Peak gradient: 89mm Hg (D).  ------------------------------------------------------------ Left atrium: The atrium was moderately dilated.  ------------------------------------------------------------ Right ventricle: The cavity size was normal. Wall thickness was normal. Systolic function was normal.  ------------------------------------------------------------ Pulmonic valve:  Poorly visualized. The valve appears to be grossly normal.  Doppler: Transvalvular velocity was within the normal range. No regurgitation.  ------------------------------------------------------------ Tricuspid valve:  Structurally normal valve.  Leaflet separation was normal. Doppler: Transvalvular velocity was within the normal range. Trivial regurgitation.  ------------------------------------------------------------ Pulmonary artery:  Systolic pressure could not be accurately estimated.  ------------------------------------------------------------ Right atrium: The atrium was normal in size.  ------------------------------------------------------------ Pericardium: There was no pericardial effusion.  ------------------------------------------------------------  2D measurements    Normal Doppler measurements  Normal Left ventricle         Left ventricle LVID ED,  45.4 mm   43-52  Ea, lat  5.37 cm/s  ------ chord,             ann, tiss PLAX              DP LVID ES,  30.7 mm   23-38  E/Ea, lat 27.3    ------ chord,             ann, tiss   7 PLAX              DP FS, chord,  32 %   >29   Ea, med  4.83 cm/s  ------ PLAX               ann, tiss LVPW, ED  12.2 mm   ------ DP IVS/LVPW  0.9    <1.3  E/Ea, med 30.4    ------ ratio, ED           ann, tiss   3 Ventricular septum       DP IVS, ED   11 mm   ------ LVOT LVOT              Peak vel,  101 cm/s  ------ Diam, S   20 mm   ------ S Area    3.14 cm^2  ------ VTI, S   21.6 cm   ------ Diam     20 mm   ------ Stroke vol 67.9 ml   ------ Aorta             Stroke   33.6 ml/m^2 ------ Root diam,  31 mm   ------ index ED               Aortic valve Left atrium          Regurg PHT 743 ms   ------ AP dim    45 mm   ------ Mitral valve AP dim   2.23 cm/m^2 <2.2  Peak E vel 147 cm/s  ------ index             Peak A vel 150 cm/s  ------                Mean vel,  115 cm/s  ------                D                Decelerati 662 ms   150-23                on time        0  Pressure  175 ms   ------                half-time                Mean     7 mm Hg ------                gradient,                D                Peak     16 mm Hg ------                gradient,                D                Peak E/A   1    ------                ratio                Area (PHT) 1.26 cm^2  ------                Area index 0.62 cm^2/m ------                (PHT)      ^2                Area    0.98 cm^2  ------                (LVOT)                continuity                Area index 0.49 cm^2/m ------                (LVOT       ^2                cont)                Annulus  69.3 cm   ------                VTI                Max regurg 453 cm/s  ------                vel                Tricuspid valve                Regurg   174 cm/s  ------                peak vel                Peak RV-RA  12 mm Hg ------                gradient,                S                Systemic veins                Estimated   3 mm Hg ------                CVP  Right ventricle                Pressure,  15 mm Hg <30                S                Sa vel,  10.2 cm/s  ------                lat ann,                tiss DP  ------------------------------------------------------------ Prepared and Electronically Authenticated by  [employer], Mihai 2015-04-30T17:23:[id number]      Transesophageal Echocardiogram Note   CHUCKIE MCCATHERN  [id number]  01-13-1956  Procedure: Transesophageal Echocardiogram  Indications: Rheumatic heart disease, mitral stenosis  Procedure Details  Consent: Obtained  Time Out: Verified patient identification, verified procedure, site/side was marked, verified correct patient position, special equipment/implants available, Radiology Safety Procedures followed, medications/allergies/relevent history reviewed, required imaging and test results available. Performed  Medications:  Fentanyl: 75 mcg  Versed: 4 mg  Left Ventrical: The cavity size was normal with normal LVEF 55-60%, no WMA, no LVH.  Mitral Valve: Hockey stick appearance of the mitral valve, predominantly of the anterior leaflet that is thickened and mildly calcified. Restricted opening.  Peak trans mitral gradient 15 mmHg, mean gradient 7 mmHg. Mobility is restricted predominantly of the anterior leaflet. Diastolic leaflet doming was present.  Moderate mitral stenosis, trace mitral regurgitation.  Mitral annuloplasty score: 7  Aortic Valve: Mild aortic insufficiency.  Tricuspid Valve: Trace TR.  Pulmonic Valve: Grossly normal.  Left Atrium/ Left atrial appendage: Left atrium is moderately dilated. No thrombus, normal filling and emptying velocities.  Atrial septum: Intact, no ASD or PFO by color Doppler.  Aorta: Minimal AS plague.  Complications: No apparent complications  Patient did tolerate procedure well.  Dorothy Spark, MD, [facility]  08/16/2013, 9:27 AM   Patient: Tyquan, Carmickle MR #: [id number] Study Date: 08/16/2013 Gender: M Age: 3 Height: 172.7 cm Weight: 89.5 kg BSA: 2.1 m^2 Pt. Status: Room:  Tonia Ghent, Doreene Burke REFERRING Erlene Quan SONOGRAPHER Joanie Coddington, RDCS ADMITTING Ena Dawley, M.D. ATTENDING Ena Dawley, M.D. PERFORMING Ena Dawley, M.D.  cc:  -------------------------------------------------------------------  ------------------------------------------------------------------- Indications: 424.0 Mitral valve disease. 786.05 Dyspnea.  ------------------------------------------------------------------- Study Conclusions  - Left ventricle: The cavity size was normal. Wall thickness was normal. Systolic function was normal. Wall motion was normal; there were no regional wall motion abnormalities. - Aortic valve: There was mild regurgitation. - Mitral valve: Valve area by pressure half-time: 1.4 cm^2. - Left atrium: The atrium was moderately dilated. No evidence of thrombus in the atrial cavity or appendage. No evidence of thrombus in the atrial cavity or appendage. - Right atrium: No evidence of thrombus in the atrial cavity or appendage.  Impressions:  - Rheumatic mitral valve with moderate leaflet  tip thickening and minimal calcifications. Moderate mitral stenosis and trace mitral egurgitation.  The anatomy seems to be favorable for a transcutaneous balloon valvuloplasty (Score 6).  -------------------------------------------------------------------  ------------------------------------------------------------------- Left ventricle: The cavity size was normal. Wall thickness was normal. Systolic function was normal. Wall motion was normal; there were no regional wall motion abnormalities.  ------------------------------------------------------------------- Aortic valve: Structurally normal valve. Trileaflet; normal thickness leaflets. Cusp separation was normal. Doppler: There was mild regurgitation.  ------------------------------------------------------------------- Aorta: There was no atheroma. There was no evidence for dissection. Aortic root: The aortic root was not dilated. Ascending aorta: The ascending aorta was normal in  size. Aortic arch: The aortic arch was normal in size. Descending aorta: The descending aorta was normal in size.  ------------------------------------------------------------------- Mitral valve: There is moderate thickening and minimal calcification of the mitral valve leaflets consistent with rheumatic disease. Thickenning is affecting the leaflets tips sparing the rest of the leaflets. Anterior leaflet has typical hockey stick appearance and has moderately restricted mobility. Anterior leaflet is doming into the left atrium in systole. The findings are consistent with moderate stenosis. Mean transmitral gradient is 7 mmHg. Valve area by pressure halftime: 1.4 cm2.  There is only trace mitral regurgitation. Structurally normal valve. Leaflet separation was normal. Doppler: There was no significant regurgitation. Valve area by pressure half-time: 1.4 cm^2. Indexed valve area by pressure half-time: 0.73 cm^2/m^2. Mean gradient (D): 7 mm Hg.  Peak gradient (D): 9 mm Hg.  ------------------------------------------------------------------- Left atrium: The atrium was moderately dilated. No evidence of thrombus in the atrial cavity or appendage. No evidence of thrombus in the atrial cavity or appendage. The appendage was morphologically a left appendage, multilobulated, and of normal size. Emptying velocity was normal.  ------------------------------------------------------------------- Right ventricle: The cavity size was normal. Wall thickness was normal. Systolic function was normal.  ------------------------------------------------------------------- Pulmonic valve: Structurally normal valve.  ------------------------------------------------------------------- Tricuspid valve: Structurally normal valve. Leaflet separation was normal. Doppler: There was mild regurgitation.  ------------------------------------------------------------------- Pulmonary artery: The main pulmonary artery was normal-sized.  ------------------------------------------------------------------- Right atrium: The atrium was normal in size. No evidence of thrombus in the atrial cavity or appendage. The appendage was morphologically a right appendage.  ------------------------------------------------------------------- Pericardium: There was no pericardial effusion.  ------------------------------------------------------------------- Prepared and Electronically Authenticated by  Ena Dawley, M.D. 2015-05-28T10:41:20  ------------------------------------------------------------------- Measurements  Mitral valve Value 07/20/2013 Reference Mitral E-wave peak 146.67 cm/s 147 --------- velocity Mitral mean 124 cm/s 115 --------- velocity, D Mitral deceleration 297.12 cm/s^2 ---------- --------- slope Mitral deceleration (H) 494 ms 662 150 - 230 time Mitral pressure 143 ms 175 --------- half-time Mitral mean 7 mm Hg 7  --------- gradient, D Mitral peak 9 mm Hg 16 --------- gradient, D Mitral valve area, 1.54 cm^2 1.26 --------- PHT, DP Mitral valve 0.73 cm^2/m^2 0.62 --------- area/bsa, PHT, DP Mitral annulus VTI, 60.3 cm 69.3 --------- D  Pulmonary arteries Value 07/20/2013 Reference PA pressure, S, DP (N) 30 mm Hg ---------- <=30  Legend: (L) and (H) mark values outside specified reference range.  (N) marks values inside specified reference range.    CARDIAC CATHETERIZATION REPORT  NAME: Tyse Auriemma [id number] DOB: 1957/03/27ADMIT DATE: 08/28/2013 Procedure Date: 08/28/2013  INTERVENTIONAL CARDIOLOGIST: Leonie Man, M.D., MS PRIMARY CARE PROVIDER: Gilford Rile, MD PRIMARY CARDIOLOGIST: Dorris Carnes, MD & Ena Dawley, MD CT Surgeon: Dr. Roxy Manns  PATIENT: BURLIN MCNAIR is a 59 y.o. male known rheumatic heart disease with mitral stenosis referred for right heart catheterization as part of evaluation for preop mitral valve replacement. His recent trouble with atrial fibrillation with heart failure therefore is being sent for valve replacement as opposed to balloon valvuloplasty.  PRE-OPERATIVE DIAGNOSIS:   Moderate-severe marked stenosis  Worsening heart failure symptoms  Atrial fibrillation, recurrent  PROCEDURES PERFORMED:   Right & Left Heart Catheterization with Native Coronary Angiography via Right Radial Artery & Right Common Femoral Vein Access.  PROCEDURE: The patient was brought to the [facility] in the fasting state and prepped and draped in the usual sterile fashion for right radial and/or femoral arterial or radial access. A modified Schylar's test was performed on the right wrist demonstrating excellent collateral flow.  Sterile technique was used including antiseptics, cap, gloves, gown, hand hygiene, mask and sheet. Skin prep: Chlorhexidine.   Consent:  Risks of procedure as well as the alternatives and risks of each were explained to the (patient/caregiver). Consent for procedure obtained.   Time Out: Verified patient identification, verified procedure, site/side was marked, verified correct patient position, special equipment/implants available, medications/allergies/relevent history reviewed, required imaging and test results available. Performed.  Access:   Initial attempts were made to exchange the existing 18-gauge right antecubital IV over a wire for a 6 Fr glide sheath were unsuccessful, the decision was made to abort the brachial approach and obtained femoral venous access.  Right Common Femoral Vein: 7 Fr Sheath - Seldinger Technique.  Right Radial Artery: 6 Fr Sheath - modified Seldinger Technique   4500 Units IV Heparin; 10 mL Radial Cocktail  Right Heart Catheterization: 7Fr Swan Ganz catheter advanced under fluoroscopy with balloon inflated to the RA, RV, then PCWP-PA for hemodynamic measurement.   Simultaneous FA & PA blood gases checked for SaO2% to calculate FICK CO/CI   Thermodilution Injections performed to calculate CO/CI   Simultaneous PCWP/LV & RV/LV pressures monitored with Angled Pigtail in LV.   Catheter removed completely out of the body with balloon deflated.    Left Heart Catheterization: 5 Fr Catheters advanced or exchanged over a J-wire; TIG 4.0 catheter advanced first.   LV Hemodynamics: TIG 4.0  Left Coronary Artery Cineangiography: JL43.5Catheter   Right Coronary Artery: JR 4 Catheter  R Femoral Venous Sheath removed in the holding area with manual pressure for hemostasis.  TR Band: 15 mL @ 1610  FINDINGS:  Hemodynamics:  Findings:   SaO2%  Pressures mmHg  Mean P  mmHg  EDP  mmHg   Right Atrium   11/9  8   Right Ventricle   31/1   5   Pulmonary Artery  72  39.6  19    PCWP   17/13  13    Central Aortic  91   131/77  99    Left Ventricle   130/0   6          Cardiac Output:   Cardiac Index:    Fick  6.65   3.26    Thermodilution  3.97   1 .95    Left Ventriculography: Deferred  Coronary Anatomy: Left Dominant  Left Main: Large-caliber, dominant vessel that bifurcates into the LAD and Circumflex. Angiographically normal. Short vessel LAD: Large caliber vessel that gives off a very large proximal septal perforator trunk and a major diagonal branch. At this branch point is a roughly 40-50% stenosis. The vessel then continues distally to gives off a second diagonal branch. After the diagonal branch the vessel then tapers to a very small-caliber vessel the apex. He barely reaches the apex. After D2 there appears to be a focal lesion although this was not very well visualized. Probably 50-60%.  D1: Moderate caliber proximal vessel it covers the anterolateral wall. Minimal luminal irregularities with several branches.  D2: Moderate caliber vessel, mid LAD, minimal luminal irregularities. At this point the vessel is actually larger than the follow on LAD. Left Circumflex: Very large caliber, dominant vessel. It gives rise to a very small marginal branch that runs like a ramus intermedius with diffuse disease including a proximal 70-80% stenosis. This is not amenable to PCI or CABG. There is an another large lateral OM before the vessel courses into the AV groove giving rise to 2 small posterior lateral branches  and the posterior descending artery. Just after the posterior lateral branches going into the PDA there is a focal irregular 70% stenosis.  OM1: Large-caliber, lateral branch mild luminal irregularities. This vessel courses down to the inferolateral apex.  LPDA: Moderate large-caliber vessel with a proximal disease noted above. Beyond that has mild diffuse luminal irregularities of 10-20%. It reaches almost to the apex.   RCA: Small  caliber, nondominant vessel. There is a separate ostium for the SA nodal artery to the main RCA has a early mid 30-40% stenosis that branches into 2 smaller marginal branches.  MEDICATIONS:  Anesthesia: Local Lidocaine 2 mL for radial, 12 ml for femoral vein  Sedation: 3 mg IV Versed, 75 mcg IV fentanyl ;   Omnipaque Contrast: 80 ml  Anticoagulation: IV Heparin 4500 Units ;  Radial Cocktail: 5 mg Verapamil, 400 mcg NTG, 2 ml 2% Lidocaine in 10 ml NS  PATIENT DISPOSITION:   The patient was transferred to the PACU holding area in a hemodynamicaly stable, chest pain free condition.  The patient tolerated the procedure well, and there were no complications. EBL: < 10 ml  The patient was stable before, during, and after the procedure.  POST-OPERATIVE DIAGNOSIS:   Known moderate stenosis. Gradient estimated of 5.8 mmHg by catheter  Conflicting cardiac outputs between Fick and thermodilution. Likely related to mitral valve disease  Moderate CAD with most significant being at his 70% distal circumflex-proximal LPDA Asian. Also tandem moderate lesions in the LAD of unclear significance, with a small downstream vessel.Marland Kitchen  PLAN OF CARE:  The patient will be admitted for planned inpatient valve surgery  I will review the films with Dr. Roxy Manns to determine whether or not he feels that CABG is warranted.  Continue home medications.   Leonie Man, M.D., M.S. [facility] [address]. [place], [place] [address] [phone]  08/28/2013 3:00 PM    CT ANGIOGRAPHY CHEST, ABDOMEN AND PELVIS  TECHNIQUE: Multidetector CT imaging through the chest, abdomen and pelvis was performed using the standard protocol during bolus administration of intravenous contrast. Multiplanar reconstructed images and MIPs were obtained and reviewed to evaluate the vascular anatomy.  CONTRAST: 54mL OMNIPAQUE IOHEXOL 350 MG/ML SOLN  COMPARISON:  08/25/2013  FINDINGS: CTA CHEST FINDINGS  Vascular: Normal caliber of the thoracic aorta without dissection or intramural hematoma. There are a few coronary artery calcifications. Normal configuration of the great vessels. The great vessels are patent.  Negative for chest lymphadenopathy. No significant pericardial or pleural fluid. The trachea and mainstem bronchi are patent. There is a new 5 mm nodule in the right lower lobe on sequence 9, image 48. The margins of this nodule are mildly hazy and there appears to be additional punctate densities in this area. Stable 5 mm nodule in the superior segment of the right lower lobe on sequence 9, image 33. Punctate density in the left upper lung on sequence 9, image 17 is unchanged. Few linear densities at the left lung base are most compatible with atelectasis.  No acute bone abnormality.  Review of the MIP images confirms the above findings.  CTA ABDOMEN AND PELVIS FINDINGS  Vascular findings: There is variant celiac anatomy. The splenic artery and left gastric artery originate from the celiac trunk. The common hepatic artery originates from the superior mesenteric artery. The celiac trunk and SMA are widely patent. Single left renal artery without significant atherosclerotic disease or stenosis. Single right renal artery has been ligated just beyond the origin and  compatible with a nephrectomy. There is mild atherosclerotic disease in the infrarenal abdominal aorta without aneurysm. Negative for an aortic dissection. The inferior mesenteric artery is patent. There is atherosclerotic disease in the iliac arteries. Approximately 50% narrowing of the mid right common iliac artery due to calcified and noncalcified plaque and similar to prior examination. Right common iliac artery disease is best seen on sequence 5, image 182. The right internal iliac artery and branches are patent. There is mild atherosclerotic disease in the  right external iliac artery. Minimum diameter of the right external artery is roughly 6 mm. The proximal right femoral arteries are patent. Mild atherosclerotic disease in left common iliac artery. There is artifact on sequence 5, image 179 which limits evaluation for narrowing in the left common iliac artery. Overall, there does not appear to be significant left iliac artery stenosis. The left internal and external iliac arteries are patent. The left external iliac artery is small measuring roughly 5 mm . Proximal left femoral arteries are patent.  Patient has undergone a right nephrectomy. Small lymph nodes in the pericaval stations are similar to the pre-surgical examination. Again noted are at least 3 low-density structures within the left kidney. There is an exophytic 1.1 cm structure with Hounsfield units of approximately 28 on the arterial phase of imaging. This structure roughly measured 1.0 cm on the previous examination. Based on the Hounsfield units, this exophytic low-density structure is indeterminate.  Normal appearance of the adrenal glands, spleen, pancreas, liver and gallbladder. Normal appearance of the prostate and urinary bladder. Again noted are large bilateral inguinal hernias containing fat. Normal appearance of the small and large bowel. No acute bone abnormality. Venous phase images in the pelvis are limited due to excessive motion artifact.  Review of the MIP images confirms the above findings.  IMPRESSION: Negative for aortic dissection or aortic aneurysm. There is atherosclerotic disease in the iliac arteries bilaterally. Small caliber of the external iliac arteries bilaterally. These vascular findings are similar to the previous examination.  Post right nephrectomy for renal cell carcinoma. No evidence for local tumor recurrence. However, the patient has a new 5 mm nodule in the right lower lobe. This new pulmonary nodule has slightly irregular  margins with small densities around it. This new nodule could be inflammatory in etiology but metastatic disease cannot be excluded. Recommend a 3-6 month follow-up to evaluate for resolution or progression. There is also a stable 5 mm nodule in the superior segment of the right lower lobe.  There are at least 3 low-density structures within the left kidney. The largest roughly measures 1.1 cm and the Hounsfield units make this lesion indeterminate. Based on the history of renal cell carcinoma, recommend more complete evaluation of this lesion with a pre and post contrast MRI.  Large bilateral inguinal hernias containing fat.  These results will be called to the ordering clinician or representative by the Radiologist Assistant, and communication documented in the PACS or zVision Dashboard.   Electronically Signed  By: Markus Daft M.D.  On: 04/10/2014 15:27   Impression:  The patient has long-standing rheumatic mitral stenosis with chronic diastolic congestive heart failure and recurrent paroxysmal atrial fibrillation. He has remained clinically stable from a cardiac standpoint since he underwent radical nephrectomy last June and he continues to maintain sinus rhythm on amiodarone. However, he continues to experience shortness of breath and fatigue with any significant physical exertion consistent with chronic diastolic congestive heart failure, New York Heart Association functional class 2-3.  Plan:  The patient and his wife were again counseled at length regarding the indications, risks and potential benefits of mitral valve replacement. The rationale for elective surgery has been explained, including a comparison between surgery and continued medical therapy with close follow-up. The likelihood of successful and durable valve repair (commissuroplasty) has been discussed with particular reference to the findings of his most recent echocardiogram. We have discussed the  option of replacing his valve as a more permanent result, particularly if the valve was replaced using a mechanical prosthesis. We contrasted the advantages of replacing the mitral valve using a mechanical prosthesis with the attendant need for long-term anticoagulation versus the alternative of replacing it using a bioprosthetic tissue valve with its potential for late structural valve deterioration and failure, depending upon the patient's longevity. The patient specifically requests that his valve be replaced using a mechanical valve. The patient understands and accepts all potential risks of surgery including but not limited to risk of death, stroke or other neurologic complication, myocardial infarction, congestive heart failure, respiratory failure, renal failure, bleeding requiring transfusion and/or reexploration, arrhythmia, infection or other wound complications, pneumonia, pleural and/or pericardial effusion, pulmonary embolus, aortic dissection or other major vascular complication, or delayed complications related to valve repair or replacement including but not limited to structural valve deterioration and failure, thrombosis, embolization, endocarditis, or paravalvular leak.        We plan to proceed with surgery on Tuesday 05/15/2014. The patient has been instructed to stop taking Coumadin after he takes his dose on Wednesday, 05/09/2014. He will otherwise remain on all other medications without change. We have again reviewed the indications, risks, potential benefits of surgery. All of his questions been addressed.   Valentina Gu. Roxy Manns, MD 05/07/2014 2:12 PM

## 2014-05-15 NOTE — Anesthesia Postprocedure Evaluation (Signed)
  Anesthesia Post-op Note  Patient: Kevin English  Procedure(s) Performed: Procedure(s): MINIMALLY INVASIVE MITRAL VALVE (MV) REPLACEMENT (Right) MINIMALLY INVASIVE MAZE PROCEDURE (N/A) TRANSESOPHAGEAL ECHOCARDIOGRAM (TEE) (N/A)  Patient Location: SICU  Anesthesia Type:General  Level of Consciousness: sedated and Patient remains intubated per anesthesia plan  Airway and Oxygen Therapy: Patient remains intubated per anesthesia plan and Patient placed on Ventilator (see vital sign flow sheet for setting)  Post-op Pain: none  Post-op Assessment: Post-op Vital signs reviewed, Patient's Cardiovascular Status Stable, Respiratory Function Stable, Patent Airway, No signs of Nausea or vomiting and Pain level controlled  Post-op Vital Signs: Reviewed and stable  Last Vitals:  Filed Vitals:   05/15/14 1530  BP:   Pulse: 103  Temp: 35 C  Resp: 18    Complications: No apparent anesthesia complications

## 2014-05-15 NOTE — Anesthesia Procedure Notes (Addendum)
Procedure Name: Intubation Date/Time: 05/15/2014 7:56 AM Performed by: Ned Grace Pre-anesthesia Checklist: Patient identified, Emergency Drugs available, Suction available and Patient being monitored Patient Re-evaluated:Patient Re-evaluated prior to inductionOxygen Delivery Method: Circle system utilized Preoxygenation: Pre-oxygenation with 100% oxygen Intubation Type: IV induction Ventilation: Two handed mask ventilation required and Oral airway inserted - appropriate to patient size Laryngoscope Size: Mac and 3 Grade View: Grade I Tube type: Oral Endobronchial tube: Left, Double lumen EBT, EBT position confirmed by auscultation and EBT position confirmed by fiberoptic bronchoscope and 39 Fr Number of attempts: 1 Airway Equipment and Method: Fiberoptic brochoscope,  Oral airway and Stylet Placement Confirmation: ETT inserted through vocal cords under direct vision,  positive ETCO2 and breath sounds checked- equal and bilateral (MDA confirmed positioning with fiberoptic scope) Tube secured with: Tape Dental Injury: Teeth and Oropharynx as per pre-operative assessment  Comments: DL x 1 by SRNA revealed grade 1 view. Trachea intubated with left 39 DLT. Fiberoptic visualization revealed tube in right bronchus- pulled back and repositioned in left bronchus.      The patient was identified and consent obtained.  TO was performed, and full barrier precautions were used.  The skin was anesthetized with lidocaine.  Once the vein was located with the 22 ga. needle using ultrasound guidance , the wire was inserted into the vein.  The wire location was confirmed with ultrasound.  The insertion site was dilated and the introducer was carefully inserted and sutured in place. The PAC was checked, and floated into the PA.  Once in the PA, the catheter was secured. The patient tolerated the procedure well.  CXR was ordered for PACU. Start: 0254 End: 2706 J. Tedra Senegal, MD

## 2014-05-16 ENCOUNTER — Encounter (HOSPITAL_COMMUNITY): Payer: Self-pay | Admitting: Thoracic Surgery (Cardiothoracic Vascular Surgery)

## 2014-05-16 ENCOUNTER — Inpatient Hospital Stay (HOSPITAL_COMMUNITY): Payer: Managed Care, Other (non HMO)

## 2014-05-16 LAB — POCT I-STAT 3, ART BLOOD GAS (G3+)
Acid-base deficit: 5 mmol/L — ABNORMAL HIGH (ref 0.0–2.0)
Acid-base deficit: 6 mmol/L — ABNORMAL HIGH (ref 0.0–2.0)
Acid-base deficit: 6 mmol/L — ABNORMAL HIGH (ref 0.0–2.0)
BICARBONATE: 20.2 meq/L (ref 20.0–24.0)
Bicarbonate: 19.6 mEq/L — ABNORMAL LOW (ref 20.0–24.0)
Bicarbonate: 20.2 mEq/L (ref 20.0–24.0)
O2 Saturation: 90 %
O2 Saturation: 88 %
O2 Saturation: 91 %
PCO2 ART: 39 mmHg (ref 35.0–45.0)
PCO2 ART: 39.4 mmHg (ref 35.0–45.0)
PH ART: 7.347 — AB (ref 7.350–7.450)
PO2 ART: 60 mmHg — AB (ref 80.0–100.0)
PO2 ART: 67 mmHg — AB (ref 80.0–100.0)
Patient temperature: 37.2
Patient temperature: 37.1
TCO2: 21 mmol/L (ref 0–100)
TCO2: 21 mmol/L (ref 0–100)
TCO2: 21 mmol/L (ref 0–100)
pCO2 arterial: 36.9 mmHg (ref 35.0–45.0)
pH, Arterial: 7.31 — ABNORMAL LOW (ref 7.350–7.450)
pH, Arterial: 7.317 — ABNORMAL LOW (ref 7.350–7.450)
pO2, Arterial: 63 mmHg — ABNORMAL LOW (ref 80.0–100.0)

## 2014-05-16 LAB — BASIC METABOLIC PANEL
ANION GAP: 6 (ref 5–15)
BUN: 16 mg/dL (ref 6–23)
CALCIUM: 8 mg/dL — AB (ref 8.4–10.5)
CO2: 20 mmol/L (ref 19–32)
Chloride: 112 mmol/L (ref 96–112)
Creatinine, Ser: 1.69 mg/dL — ABNORMAL HIGH (ref 0.50–1.35)
GFR calc Af Amer: 49 mL/min — ABNORMAL LOW (ref >90)
GFR calc non Af Amer: 42 mL/min — ABNORMAL LOW (ref >90)
GLUCOSE: 120 mg/dL — AB (ref 70–99)
POTASSIUM: 3.9 mmol/L (ref 3.5–5.1)
SODIUM: 138 mmol/L (ref 135–145)

## 2014-05-16 LAB — CBC
HCT: 36.8 % — ABNORMAL LOW (ref 39.0–52.0)
HCT: 34.7 % — ABNORMAL LOW (ref 39.0–52.0)
Hemoglobin: 12.6 g/dL — ABNORMAL LOW (ref 13.0–17.0)
Hemoglobin: 11.8 g/dL — ABNORMAL LOW (ref 13.0–17.0)
MCH: 31 pg (ref 26.0–34.0)
MCH: 31 pg (ref 26.0–34.0)
MCHC: 34.2 g/dL (ref 30.0–36.0)
MCHC: 34 g/dL (ref 30.0–36.0)
MCV: 90.4 fL (ref 78.0–100.0)
MCV: 91.1 fL (ref 78.0–100.0)
PLATELETS: 148 10*3/uL — AB (ref 150–400)
Platelets: 178 10*3/uL (ref 150–400)
RBC: 4.07 MIL/uL — AB (ref 4.22–5.81)
RBC: 3.81 MIL/uL — ABNORMAL LOW (ref 4.22–5.81)
RDW: 14.2 % (ref 11.5–15.5)
RDW: 14.5 % (ref 11.5–15.5)
WBC: 24.6 10*3/uL — ABNORMAL HIGH (ref 4.0–10.5)
WBC: 25.4 10*3/uL — ABNORMAL HIGH (ref 4.0–10.5)

## 2014-05-16 LAB — GLUCOSE, CAPILLARY
GLUCOSE-CAPILLARY: 137 mg/dL — AB (ref 70–99)
GLUCOSE-CAPILLARY: 157 mg/dL — AB (ref 70–99)
GLUCOSE-CAPILLARY: 116 mg/dL — AB (ref 70–99)
GLUCOSE-CAPILLARY: 121 mg/dL — AB (ref 70–99)
GLUCOSE-CAPILLARY: 110 mg/dL — AB (ref 70–99)
GLUCOSE-CAPILLARY: 117 mg/dL — AB (ref 70–99)
GLUCOSE-CAPILLARY: 121 mg/dL — AB (ref 70–99)
GLUCOSE-CAPILLARY: 138 mg/dL — AB (ref 70–99)
GLUCOSE-CAPILLARY: 167 mg/dL — AB (ref 70–99)
Glucose-Capillary: 108 mg/dL — ABNORMAL HIGH (ref 70–99)
Glucose-Capillary: 110 mg/dL — ABNORMAL HIGH (ref 70–99)
Glucose-Capillary: 118 mg/dL — ABNORMAL HIGH (ref 70–99)
Glucose-Capillary: 131 mg/dL — ABNORMAL HIGH (ref 70–99)
Glucose-Capillary: 131 mg/dL — ABNORMAL HIGH (ref 70–99)
Glucose-Capillary: 137 mg/dL — ABNORMAL HIGH (ref 70–99)
Glucose-Capillary: 141 mg/dL — ABNORMAL HIGH (ref 70–99)
Glucose-Capillary: 143 mg/dL — ABNORMAL HIGH (ref 70–99)
Glucose-Capillary: 117 mg/dL — ABNORMAL HIGH (ref 70–99)
Glucose-Capillary: 105 mg/dL — ABNORMAL HIGH (ref 70–99)
Glucose-Capillary: 106 mg/dL — ABNORMAL HIGH (ref 70–99)
Glucose-Capillary: 115 mg/dL — ABNORMAL HIGH (ref 70–99)
Glucose-Capillary: 117 mg/dL — ABNORMAL HIGH (ref 70–99)
Glucose-Capillary: 121 mg/dL — ABNORMAL HIGH (ref 70–99)

## 2014-05-16 LAB — POCT I-STAT 7, (LYTES, BLD GAS, ICA,H+H)
ACID-BASE DEFICIT: 7 mmol/L — AB (ref 0.0–2.0)
Bicarbonate: 20.3 mEq/L (ref 20.0–24.0)
CALCIUM ION: 1.11 mmol/L — AB (ref 1.13–1.30)
HCT: 42 % (ref 39.0–52.0)
HEMOGLOBIN: 14.3 g/dL (ref 13.0–17.0)
O2 Saturation: 93 %
Potassium: 4.1 mmol/L (ref 3.5–5.1)
Sodium: 141 mmol/L (ref 135–145)
TCO2: 22 mmol/L (ref 0–100)
pCO2 arterial: 43.6 mmHg (ref 35.0–45.0)
pH, Arterial: 7.271 — ABNORMAL LOW (ref 7.350–7.450)
pO2, Arterial: 72 mmHg — ABNORMAL LOW (ref 80.0–100.0)

## 2014-05-16 LAB — POCT I-STAT, CHEM 8
BUN: 20 mg/dL (ref 6–23)
CALCIUM ION: 1.31 mmol/L — AB (ref 1.13–1.30)
Chloride: 109 mmol/L (ref 96–112)
Creatinine, Ser: 1.7 mg/dL — ABNORMAL HIGH (ref 0.50–1.35)
Glucose, Bld: 143 mg/dL — ABNORMAL HIGH (ref 70–99)
HCT: 35 % — ABNORMAL LOW (ref 39.0–52.0)
HEMOGLOBIN: 11.9 g/dL — AB (ref 13.0–17.0)
Potassium: 4.3 mmol/L (ref 3.5–5.1)
SODIUM: 140 mmol/L (ref 135–145)
TCO2: 18 mmol/L (ref 0–100)

## 2014-05-16 LAB — CREATININE, SERUM
Creatinine, Ser: 1.82 mg/dL — ABNORMAL HIGH (ref 0.50–1.35)
GFR calc Af Amer: 45 mL/min — ABNORMAL LOW (ref >90)
GFR calc non Af Amer: 39 mL/min — ABNORMAL LOW (ref >90)

## 2014-05-16 LAB — MAGNESIUM
Magnesium: 2.8 mg/dL — ABNORMAL HIGH (ref 1.5–2.5)
Magnesium: 2.7 mg/dL — ABNORMAL HIGH (ref 1.5–2.5)

## 2014-05-16 MED ORDER — WARFARIN - PHYSICIAN DOSING INPATIENT
Freq: Every day | Status: DC
  Administered 2014-05-16 – 2014-05-21 (×4)

## 2014-05-16 MED ORDER — WARFARIN SODIUM 2.5 MG PO TABS
2.5000 mg | ORAL_TABLET | Freq: Every day | ORAL | Status: DC
  Administered 2014-05-16 – 2014-05-18 (×3): 2.5 mg via ORAL
  Filled 2014-05-16 (×4): qty 1

## 2014-05-16 MED ORDER — ENOXAPARIN SODIUM 30 MG/0.3ML ~~LOC~~ SOLN
30.0000 mg | Freq: Every day | SUBCUTANEOUS | Status: DC
  Administered 2014-05-17 – 2014-05-20 (×4): 30 mg via SUBCUTANEOUS
  Filled 2014-05-16 (×6): qty 0.3

## 2014-05-16 MED ORDER — LEVALBUTEROL HCL 0.63 MG/3ML IN NEBU
0.6300 mg | INHALATION_SOLUTION | Freq: Four times a day (QID) | RESPIRATORY_TRACT | Status: DC | PRN
  Administered 2014-05-16 – 2014-05-17 (×2): 0.63 mg via RESPIRATORY_TRACT

## 2014-05-16 MED ORDER — FUROSEMIDE 10 MG/ML IJ SOLN
20.0000 mg | Freq: Four times a day (QID) | INTRAMUSCULAR | Status: AC
  Administered 2014-05-16 – 2014-05-17 (×3): 20 mg via INTRAVENOUS
  Filled 2014-05-16 (×3): qty 2

## 2014-05-16 MED ORDER — MORPHINE SULFATE 2 MG/ML IJ SOLN
2.0000 mg | INTRAMUSCULAR | Status: DC | PRN
  Administered 2014-05-16 (×2): 2 mg via INTRAVENOUS
  Filled 2014-05-16 (×2): qty 1

## 2014-05-16 MED ORDER — SERTRALINE HCL 50 MG PO TABS
50.0000 mg | ORAL_TABLET | Freq: Every day | ORAL | Status: DC
  Administered 2014-05-16 – 2014-05-17 (×2): 50 mg via ORAL
  Filled 2014-05-16 (×3): qty 1

## 2014-05-16 MED ORDER — CETYLPYRIDINIUM CHLORIDE 0.05 % MT LIQD
7.0000 mL | Freq: Two times a day (BID) | OROMUCOSAL | Status: DC
  Administered 2014-05-16 – 2014-05-18 (×5): 7 mL via OROMUCOSAL

## 2014-05-16 MED ORDER — INSULIN ASPART 100 UNIT/ML ~~LOC~~ SOLN
0.0000 [IU] | SUBCUTANEOUS | Status: DC
  Administered 2014-05-16: 4 [IU] via SUBCUTANEOUS
  Administered 2014-05-16 – 2014-05-17 (×2): 2 [IU] via SUBCUTANEOUS

## 2014-05-16 MED ORDER — INSULIN DETEMIR 100 UNIT/ML ~~LOC~~ SOLN
20.0000 [IU] | Freq: Once | SUBCUTANEOUS | Status: AC
  Administered 2014-05-16: 20 [IU] via SUBCUTANEOUS
  Filled 2014-05-16 (×2): qty 0.2

## 2014-05-16 MED ORDER — ALPRAZOLAM 0.25 MG PO TABS
0.2500 mg | ORAL_TABLET | Freq: Every evening | ORAL | Status: DC | PRN
  Administered 2014-05-18: 0.25 mg via ORAL
  Filled 2014-05-16: qty 1

## 2014-05-16 MED ORDER — LEVALBUTEROL HCL 0.63 MG/3ML IN NEBU
INHALATION_SOLUTION | RESPIRATORY_TRACT | Status: AC
  Administered 2014-05-16: 0.63 mg via RESPIRATORY_TRACT
  Filled 2014-05-16: qty 3

## 2014-05-16 MED FILL — Heparin Sodium (Porcine) Inj 1000 Unit/ML: INTRAMUSCULAR | Qty: 30 | Status: AC

## 2014-05-16 MED FILL — Sodium Chloride IV Soln 0.9%: INTRAVENOUS | Qty: 2000 | Status: AC

## 2014-05-16 MED FILL — Sodium Bicarbonate IV Soln 8.4%: INTRAVENOUS | Qty: 50 | Status: AC

## 2014-05-16 MED FILL — Magnesium Sulfate Inj 50%: INTRAMUSCULAR | Qty: 10 | Status: AC

## 2014-05-16 MED FILL — Lidocaine HCl IV Inj 20 MG/ML: INTRAVENOUS | Qty: 5 | Status: AC

## 2014-05-16 MED FILL — Electrolyte-R (PH 7.4) Solution: INTRAVENOUS | Qty: 5000 | Status: AC

## 2014-05-16 MED FILL — Heparin Sodium (Porcine) Inj 1000 Unit/ML: INTRAMUSCULAR | Qty: 10 | Status: AC

## 2014-05-16 MED FILL — Potassium Chloride Inj 2 mEq/ML: INTRAVENOUS | Qty: 40 | Status: AC

## 2014-05-16 MED FILL — Mannitol IV Soln 20%: INTRAVENOUS | Qty: 500 | Status: AC

## 2014-05-16 NOTE — Progress Notes (Addendum)
      [address]       [place] [address]             [phone]        CARDIOTHORACIC SURGERY PROGRESS NOTE   R1 Day Post-Op Procedure(s) (LRB): MINIMALLY INVASIVE MITRAL VALVE (MV) REPLACEMENT (Right) MINIMALLY INVASIVE MAZE PROCEDURE (N/A) TRANSESOPHAGEAL ECHOCARDIOGRAM (TEE) (N/A)  Subjective: Mild soreness in chest.  Denies nausea, SOB.  Feels like he needs to cough but can't cough up any sputum  Objective: Vital signs: BP Readings from Last 1 Encounters:  05/16/14 101/60   Pulse Readings from Last 1 Encounters:  05/16/14 91   Resp Readings from Last 1 Encounters:  05/16/14 20   Temp Readings from Last 1 Encounters:  05/16/14 98.4 F (36.9 C) Temporal    Hemodynamics: PAP: (31-51)/(8-33) 43/21 mmHg CO:  [3.9 L/min-5.2 L/min] 4.5 L/min CI:  [1.9 L/min/m2-2.6 L/min/m2] 2.2 L/min/m2  Physical Exam:  Rhythm:   Junctional 30-40 under pacer - AV pacing  Breath sounds: Scattered wheezes and rhonchi  Heart sounds:  RRR  Incisions:  Dressings dry, intact  Abdomen:  Soft, non-distended, non-tender  Extremities:  Warm, well-perfused  Chest tubes:  Low volume thin serosanguinous output   Intake/Output from previous day: 02/23 0701 - 02/24 0700 In: 7047.2 [I.V.:5357.2; Blood:600; NG/GT:90; IV Piggyback:1000] Out: 8453 [Urine:2105; Blood:1150; Chest Tube:600] Intake/Output this shift:    Lab Results:  CBC: Recent Labs  05/15/14 2100 05/15/14 2106 05/16/14 0400  WBC 26.6*  --  24.6*  HGB 13.1 13.6 12.6*  HCT 38.5* 40.0 36.8*  PLT 193  --  178    BMET:  Recent Labs  05/15/14 2106 05/16/14 0400  NA 142 138  K 4.1 3.9  CL 109 112  CO2  --  20  GLUCOSE 146* 120*  BUN 17 16  CREATININE 1.50* 1.69*  CALCIUM  --  8.0*     CBG (last 3)   Recent Labs  05/16/14 0404 05/16/14 0514 05/16/14 0606  GLUCAP 131* 108* 117*    ABG    Component Value Date/Time   PHART 7.317* 05/16/2014 0516   PCO2ART 39.4 05/16/2014 0516   PO2ART 67.0* 05/16/2014 0516   HCO3 20.2 05/16/2014 0516   TCO2 21 05/16/2014 0516   ACIDBASEDEF 6.0* 05/16/2014 0516   O2SAT 91.0 05/16/2014 0516    CXR: Mild patchy opacity both lungs.  Overall looks good.  Assessment/Plan: S/P Procedure(s) (LRB): MINIMALLY INVASIVE MITRAL VALVE (MV) REPLACEMENT (Right) MINIMALLY INVASIVE MAZE PROCEDURE (N/A) TRANSESOPHAGEAL ECHOCARDIOGRAM (TEE) (N/A)  Overall doing well POD1 AV pacing w/ stable hemodynamics on renal dose dopamine and low dose Neo Marginal oxygenation w/ patchy opacity both lungs c/w atelectasis + mild pulm vasc congestion +/- mild acute lung injury +/- bronchitis Expected post op acute blood loss anemia, very mild Expected post op volume excess, mild Chronic kidney disease - s/p right nephrectomy OSA   Continue AV pacing and hold beta blockers + amiodarone for noe  Mobilize  Pulm toilet  Nebs  Resume CPAP at night for sleep  Hold diuretics until stable off Neo drip  Low dose lovenox for DVT prophylaxis until INR 2.0  Start coumadin slowly   Makaylynn Bonillas H 05/16/2014 8:07 AM

## 2014-05-16 NOTE — Plan of Care (Signed)
Problem: Phase II - Intermediate Post-Op Goal: Wean to Extubate Outcome: Completed/Met Date Met:  05/16/14 Patient extubated 05/16/14 at 0230 Goal: Maintain Hemodynamic Stability Outcome: Progressing Patient on minimal vasopressors. Neo at 10 Goal: CBGs/Blood Glucose per SCIP Criteria Outcome: Progressing Patient remains on glucostabilizer for high insulin infusion rate

## 2014-05-16 NOTE — Procedures (Signed)
Extubation Procedure Note  Patient Details:   Name: Kevin English DOB: 1955-08-03 MRN: [id number]   Evaluation  O2 sats: stable throughout Complications: No apparent complications Patient did tolerate procedure well. Bilateral Breath Sounds: Rhonchi, Expiratory wheezes  Pt ability to speak: Yes  NIF -30, FVC 716ml. Pt able to breath around cuff. Pt extubated per Dr. Roxan Hockey. Pt placed on 6L N/C and tolerating well.   Doroteo Bradford 05/16/2014, 2:41 AM

## 2014-05-16 NOTE — Progress Notes (Signed)
CT surgery PM Rounds   Patient examined and record reviewed.Hemodynamics stable,labs satisfactory.Patient had stable day.Continue current care. VAN TRIGT III,PETER 05/16/2014

## 2014-05-16 NOTE — Progress Notes (Signed)
When weaning ventilator, patient able to follow commands, hold head up, open mouth and stick tongue out.  However per the ABG the PH 3.347, pO2 63, and O2 Sat 90.  Patient passed mechanics.  Dr. Roxan Hockey called and instructed to pull ETT.  ETT pulled and patient placed on 6L [place] with O2 Sat 93%.  Will continue to monitor closely.

## 2014-05-16 NOTE — Progress Notes (Signed)
UR Completed.  [phone]  

## 2014-05-17 ENCOUNTER — Inpatient Hospital Stay (HOSPITAL_COMMUNITY): Payer: Managed Care, Other (non HMO)

## 2014-05-17 LAB — BASIC METABOLIC PANEL
Anion gap: 6 (ref 5–15)
BUN: 23 mg/dL (ref 6–23)
CO2: 27 mmol/L (ref 19–32)
Calcium: 8.8 mg/dL (ref 8.4–10.5)
Chloride: 108 mmol/L (ref 96–112)
Creatinine, Ser: 1.89 mg/dL — ABNORMAL HIGH (ref 0.50–1.35)
GFR calc Af Amer: 43 mL/min — ABNORMAL LOW (ref >90)
GFR calc non Af Amer: 37 mL/min — ABNORMAL LOW (ref >90)
Glucose, Bld: 130 mg/dL — ABNORMAL HIGH (ref 70–99)
POTASSIUM: 4.2 mmol/L (ref 3.5–5.1)
Sodium: 141 mmol/L (ref 135–145)

## 2014-05-17 LAB — GLUCOSE, CAPILLARY
GLUCOSE-CAPILLARY: 126 mg/dL — AB (ref 70–99)
GLUCOSE-CAPILLARY: 116 mg/dL — AB (ref 70–99)
GLUCOSE-CAPILLARY: 172 mg/dL — AB (ref 70–99)
Glucose-Capillary: 121 mg/dL — ABNORMAL HIGH (ref 70–99)

## 2014-05-17 LAB — CBC
HCT: 35 % — ABNORMAL LOW (ref 39.0–52.0)
HEMOGLOBIN: 11.6 g/dL — AB (ref 13.0–17.0)
MCH: 31.1 pg (ref 26.0–34.0)
MCHC: 33.1 g/dL (ref 30.0–36.0)
MCV: 93.8 fL (ref 78.0–100.0)
Platelets: 156 10*3/uL (ref 150–400)
RBC: 3.73 MIL/uL — AB (ref 4.22–5.81)
RDW: 15 % (ref 11.5–15.5)
WBC: 26 10*3/uL — AB (ref 4.0–10.5)

## 2014-05-17 LAB — PROTIME-INR
INR: 1.31 (ref 0.00–1.49)
Prothrombin Time: 16.5 seconds — ABNORMAL HIGH (ref 11.6–15.2)

## 2014-05-17 MED ORDER — SODIUM CHLORIDE 0.9 % IJ SOLN: 3.0000 mL | INTRAMUSCULAR | Status: DC | PRN

## 2014-05-17 MED ORDER — MOVING RIGHT ALONG BOOK
Freq: Once | Status: AC
  Administered 2014-05-17: 09:00:00
  Filled 2014-05-17: qty 1

## 2014-05-17 MED ORDER — SODIUM CHLORIDE 0.9 % IJ SOLN
3.0000 mL | Freq: Two times a day (BID) | INTRAMUSCULAR | Status: DC
  Administered 2014-05-17 – 2014-05-19 (×6): 3 mL via INTRAVENOUS

## 2014-05-17 MED ORDER — SODIUM CHLORIDE 0.9 % IV SOLN: 250.0000 mL | INTRAVENOUS | Status: DC | PRN

## 2014-05-17 MED ORDER — FUROSEMIDE 40 MG PO TABS
40.0000 mg | ORAL_TABLET | Freq: Two times a day (BID) | ORAL | Status: DC
  Administered 2014-05-18: 40 mg via ORAL
  Filled 2014-05-17 (×3): qty 1

## 2014-05-17 MED ORDER — POTASSIUM CHLORIDE CRYS ER 20 MEQ PO TBCR
20.0000 meq | EXTENDED_RELEASE_TABLET | Freq: Two times a day (BID) | ORAL | Status: DC
  Filled 2014-05-17 (×2): qty 1

## 2014-05-17 MED ORDER — FUROSEMIDE 10 MG/ML IJ SOLN
20.0000 mg | Freq: Four times a day (QID) | INTRAMUSCULAR | Status: AC
  Administered 2014-05-17 (×3): 20 mg via INTRAVENOUS
  Filled 2014-05-17 (×3): qty 2

## 2014-05-17 NOTE — Progress Notes (Signed)
POD # 2 mitral maze  Up in chair  BP 117/69 mmHg  Pulse 70  Temp(Src) 98.3 F (36.8 C) (Oral)  Resp 21  Ht 5\' 8"  (1.727 m)  Wt 208 lb 15.9 oz (94.8 kg)  BMI 31.79 kg/m2  SpO2 86%  AV sequential paced   Intake/Output Summary (Last 24 hours) at 05/17/14 1855 Last data filed at 05/17/14 1500  Gross per 24 hour  Intake 424.51 ml  Output   1890 ml  Net -1465.49 ml    Continue current care

## 2014-05-17 NOTE — Progress Notes (Addendum)
[address]       [place] [address]             [phone]        CARDIOTHORACIC SURGERY PROGRESS NOTE   R2 Days Post-Op Procedure(s) (LRB): MINIMALLY INVASIVE MITRAL VALVE (MV) REPLACEMENT (Right) MINIMALLY INVASIVE MAZE PROCEDURE (N/A) TRANSESOPHAGEAL ECHOCARDIOGRAM (TEE) (N/A)  Subjective: Looks and feels well.  Denies pain, SOB.  O2 sats drop into mid 80's when O2 off, but patient asymptomatic.  Only complaint is "feels doped up" due to pain medications.  Objective: Vital signs: BP Readings from Last 1 Encounters:  05/17/14 113/69   Pulse Readings from Last 1 Encounters:  05/17/14 80   Resp Readings from Last 1 Encounters:  05/17/14 16   Temp Readings from Last 1 Encounters:  05/17/14 98.1 F (36.7 C) Oral    Hemodynamics: PAP: (40-42)/(12-15) 42/15 mmHg CO:  [4.8 L/min] 4.8 L/min CI:  [2.4 L/min/m2] 2.4 L/min/m2  Physical Exam:  Rhythm:   Sinus w/ 3rd degree AV block and profound bradycardia when pacer off - AV pacing w/ good pacer thresholds  Breath sounds: clear  Heart sounds:  RRR  Incisions:  Dressings dry, intact  Abdomen:  Soft, non-distended, non-tender  Extremities:  Warm, well-perfused  Chest tubes:  Low volume thin serosanguinous output but still draining   Intake/Output from previous day: 02/24 0701 - 02/25 0700 In: 457.6 [P.O.:120; I.V.:237.6; IV Piggyback:100] Out: 2055 [Urine:1345; Chest Tube:710] Intake/Output this shift:    Lab Results:  CBC: Recent Labs  05/16/14 1600 05/16/14 1702 05/17/14 0545  WBC 25.4*  --  26.0*  HGB 11.8* 11.9* 11.6*  HCT 34.7* 35.0* 35.0*  PLT 148*  --  156    BMET:  Recent Labs  05/16/14 0400  05/16/14 1702 05/17/14 0545  NA 138  --  140 141  K 3.9  --  4.3 4.2  CL 112  --  109 108  CO2 20  --   --  27  GLUCOSE 120*  --  143* 130*  BUN 16  --  20 23  CREATININE 1.69*  < > 1.70* 1.89*  CALCIUM 8.0*  --   --  8.8  < > = values in this interval not displayed.    CBG (last 3)   Recent Labs  05/16/14 1942 05/16/14 2321 05/17/14 0339  GLUCAP 167* 126* 116*    ABG    Component Value Date/Time   PHART 7.317* 05/16/2014 0516   PCO2ART 39.4 05/16/2014 0516   PO2ART 67.0* 05/16/2014 0516   HCO3 20.2 05/16/2014 0516   TCO2 18 05/16/2014 1702   ACIDBASEDEF 6.0* 05/16/2014 0516   O2SAT 91.0 05/16/2014 0516    CXR: PORTABLE CHEST - 1 VIEW  COMPARISON: May 16, 2014  FINDINGS: Swan-Ganz catheter is been removed. Cordis tip is in the left innominate vein. There is a chest tube on the right. Temporary pacemaker wires are attached to the right heart. There is a rather minimal apical pneumothorax on the right. No tension component.  There is mild atelectatic change in the right upper lobe and left base regions. There is no airspace consolidation. Heart is mildly enlarged with pulmonary vascularity within normal limits. There is a clamp in the left atrial appendage region. There is a mitral valve replacement. No adenopathy.  IMPRESSION: Tube and catheter positions as described. Rather minimal right apical pneumothorax without tension component. Atelectasis right upper lobe and left base regions. No consolidation. No change in  cardiac silhouette.   Electronically Signed  By: Lowella Grip III M.D.  On: 05/17/2014 08:16  Assessment/Plan: S/P Procedure(s) (LRB): MINIMALLY INVASIVE MITRAL VALVE (MV) REPLACEMENT (Right) MINIMALLY INVASIVE MAZE PROCEDURE (N/A) TRANSESOPHAGEAL ECHOCARDIOGRAM (TEE) (N/A)  Overall doing well POD2 Remains in complete heart block, pacer-dependent w/ very slow escape rhythm Oxygenation much improved Expected post op acute blood loss anemia, very mild Expected post op volume excess, mild, diuresing Expected post op atelectasis, mild, R>L OSA w/ chronic hypoxemia, hypoventilation - doing well w/ home CPAP machine for sleep Chronic kidney disease s/p right nephrectomy for renal cell CA,  creatinine stable at baseline Post op leukocytosis, likely reactive - no signs of active infection   Mobilize  Pulm toilet  Diuresis  Wean dopamine off  D/C foley and central line  PT consult to assist w/ mobilization  Keep in SICU due to pacer-dependency with very slow escape rhythm   Kevin English H 05/17/2014 8:48 AM

## 2014-05-17 NOTE — Progress Notes (Signed)
Placed patient on home CPAP via nasal mask, per home settings with 6 lpm O2 bleed in.

## 2014-05-17 NOTE — Progress Notes (Signed)
Rehab Admissions Coordinator Note:  Patient was screened by Cleatrice Burke for appropriateness for an Inpatient Acute Rehab Consult per PT recommendation   At this time, we are recommending an inpt rehab consult if pt fails to progress with therapy. recommedn OT eval also. Marland Kitchen  Cleatrice Burke 05/17/2014, 1:31 PM  I can be reached at [phone].

## 2014-05-17 NOTE — Evaluation (Signed)
Physical Therapy Evaluation Patient Details Name: Kevin English MRN: [id number] DOB: 11-16-1955 Today's Date: 05/17/2014   History of Present Illness  Pt s/p mini MVR with hx of CHF, AFib, OSA periods of dizziness over the last few years. Pt with home CPAP use.  Clinical Impression  Pt initially pleasant on arrival and able to answer PLOF and joking. Pt became agitated at times during session presumptively when he was frustrated over needing assist. Pt educated for transfers and gait with partial knee buckling during gait and chair pulled to pt. Pt 91% on 5 L  on arrival but desat to 86% with transfer to EOB and transitioned pt to 50% venturi for mobility with sats 88-93% with RR 22, HR 71 paced throughout. BP 109/70. End of session in bed pt with continued desaturation to 86% despite venturi and RN transitioned to CPAP with sats returning to 94%. Pt with cardiopulmonary and below deficits who will benefit from acute therapy to maximize mobility, function, safety and activity tolerance to decrease burden of care at D/C.    Follow Up Recommendations CIR    Equipment Recommendations  Rolling walker with 5" wheels;3in1 (PT)    Recommendations for Other Services OT consult     Precautions / Restrictions Precautions Precautions: Fall Precaution Comments: watch sats, pacer dependent, chest tube      Mobility  Bed Mobility Overal bed mobility: Needs Assistance Bed Mobility: Supine to Sit Rolling: Min assist   Supine to sit: Min assist     General bed mobility comments: cues for sequence and safety with assist to elevate trunk  Transfers Overall transfer level: Needs assistance   Transfers: Sit to/from Stand Sit to Stand: Min assist         General transfer comment: cues for sequence and safety  Ambulation/Gait Ambulation/Gait assistance: Min assist Ambulation Distance (Feet): 40 Feet Assistive device: Rolling walker (2 wheeled) Gait Pattern/deviations: Step-through  pattern;Trunk flexed   Gait velocity interpretation: Below normal speed for age/gender General Gait Details: pt with partial buckling x 3 during gait and assist for balance and max cues for posture, position in RW and breathing. pt on 50% venturi with sats 86-93%. Chair pulled to pt for safety and respiratory status  Stairs            Wheelchair Mobility    Modified Rankin (Stroke Patients Only)       Balance Overall balance assessment: Needs assistance   Sitting balance-Leahy Scale: Good       Standing balance-Leahy Scale: Poor                               Pertinent Vitals/Pain Pain Assessment: No/denies pain    Home Living Family/patient expects to be discharged to:: Private residence Living Arrangements: Spouse/significant other Available Help at Discharge: Family;Available 24 hours/day Type of Home: House Home Access: Level entry     Home Layout: Two level;Able to live on main level with bedroom/bathroom Home Equipment: None      Prior Function Level of Independence: Independent               Hand Dominance        Extremity/Trunk Assessment   Upper Extremity Assessment: Overall WFL for tasks assessed           Lower Extremity Assessment: Generalized weakness      Cervical / Trunk Assessment: Normal  Communication   Communication: No difficulties  Cognition Arousal/Alertness: Awake/alert  Behavior During Therapy: Agitated;WFL for tasks assessed/performed Overall Cognitive Status: No family/caregiver present to determine baseline cognitive functioning                      General Comments      Exercises        Assessment/Plan    PT Assessment Patient needs continued PT services  PT Diagnosis Generalized weakness;Difficulty walking   PT Problem List Cardiopulmonary status limiting activity;Decreased mobility;Decreased balance;Decreased safety awareness;Decreased activity tolerance;Decreased knowledge of  use of DME;Decreased strength  PT Treatment Interventions Gait training;Functional mobility training;Therapeutic activities;Therapeutic exercise;Balance training;Patient/family education;DME instruction   PT Goals (Current goals can be found in the Care Plan section) Acute Rehab PT Goals Patient Stated Goal: be able to return home PT Goal Formulation: With patient Time For Goal Achievement: 05/31/14 Potential to Achieve Goals: Good    Frequency Min 3X/week   Barriers to discharge Decreased caregiver support      Co-evaluation               End of Session Equipment Utilized During Treatment: Gait belt;Oxygen Activity Tolerance: Patient limited by fatigue Patient left: in bed;with call bell/phone within reach;with nursing/sitter in room Nurse Communication: Mobility status;Precautions         Time: 8588-5027 PT Time Calculation (min) (ACUTE ONLY): 33 min   Charges:   PT Evaluation $Initial PT Evaluation Tier I: 1 Procedure PT Treatments $Gait Training: 8-22 mins   PT G CodesMelford Aase 05/17/2014, 1:26 PM Elwyn Reach, [place]

## 2014-05-18 ENCOUNTER — Inpatient Hospital Stay (HOSPITAL_COMMUNITY): Payer: Managed Care, Other (non HMO)

## 2014-05-18 LAB — BASIC METABOLIC PANEL
Anion gap: 8 (ref 5–15)
BUN: 29 mg/dL — ABNORMAL HIGH (ref 6–23)
CO2: 22 mmol/L (ref 19–32)
Calcium: 8.8 mg/dL (ref 8.4–10.5)
Chloride: 105 mmol/L (ref 96–112)
Creatinine, Ser: 1.99 mg/dL — ABNORMAL HIGH (ref 0.50–1.35)
GFR calc Af Amer: 40 mL/min — ABNORMAL LOW (ref >90)
GFR, EST NON AFRICAN AMERICAN: 34 mL/min — AB (ref >90)
Glucose, Bld: 144 mg/dL — ABNORMAL HIGH (ref 70–99)
Potassium: 4.1 mmol/L (ref 3.5–5.1)
SODIUM: 135 mmol/L (ref 135–145)

## 2014-05-18 LAB — CBC
HEMATOCRIT: 35.9 % — AB (ref 39.0–52.0)
HEMOGLOBIN: 11.9 g/dL — AB (ref 13.0–17.0)
MCH: 30.7 pg (ref 26.0–34.0)
MCHC: 33.1 g/dL (ref 30.0–36.0)
MCV: 92.8 fL (ref 78.0–100.0)
Platelets: 168 10*3/uL (ref 150–400)
RBC: 3.87 MIL/uL — AB (ref 4.22–5.81)
RDW: 15.1 % (ref 11.5–15.5)
WBC: 30 10*3/uL — AB (ref 4.0–10.5)

## 2014-05-18 LAB — PROTIME-INR
INR: 1.2 (ref 0.00–1.49)
Prothrombin Time: 15.4 seconds — ABNORMAL HIGH (ref 11.6–15.2)

## 2014-05-18 MED ORDER — PIPERACILLIN-TAZOBACTAM 3.375 G IVPB
3.3750 g | Freq: Three times a day (TID) | INTRAVENOUS | Status: AC
  Administered 2014-05-18 – 2014-05-25 (×21): 3.375 g via INTRAVENOUS
  Filled 2014-05-18 (×22): qty 50

## 2014-05-18 MED ORDER — FUROSEMIDE 10 MG/ML IJ SOLN: 80.0000 mg | Freq: Once | INTRAMUSCULAR | Status: AC

## 2014-05-18 MED ORDER — FUROSEMIDE 10 MG/ML IJ SOLN: 40.0000 mg | Freq: Once | INTRAMUSCULAR | Status: DC

## 2014-05-18 MED ORDER — CETYLPYRIDINIUM CHLORIDE 0.05 % MT LIQD
7.0000 mL | Freq: Two times a day (BID) | OROMUCOSAL | Status: DC
  Administered 2014-05-19 – 2014-05-24 (×10): 7 mL via OROMUCOSAL

## 2014-05-18 MED ORDER — DEXTROSE 5 % IV SOLN
10.0000 mg/h | INTRAVENOUS | Status: DC
  Administered 2014-05-18: 10 mg/h via INTRAVENOUS
  Filled 2014-05-18 (×4): qty 25

## 2014-05-18 MED ORDER — IPRATROPIUM-ALBUTEROL 0.5-2.5 (3) MG/3ML IN SOLN
3.0000 mL | RESPIRATORY_TRACT | Status: DC
  Administered 2014-05-18 – 2014-05-21 (×20): 3 mL via RESPIRATORY_TRACT
  Filled 2014-05-18 (×20): qty 3

## 2014-05-18 MED ORDER — PANTOPRAZOLE SODIUM 40 MG IV SOLR
40.0000 mg | Freq: Two times a day (BID) | INTRAVENOUS | Status: DC
  Administered 2014-05-18 – 2014-05-19 (×3): 40 mg via INTRAVENOUS
  Filled 2014-05-18 (×5): qty 40

## 2014-05-18 MED ORDER — VANCOMYCIN HCL 10 G IV SOLR
1250.0000 mg | INTRAVENOUS | Status: DC
  Administered 2014-05-18 – 2014-05-21 (×4): 1250 mg via INTRAVENOUS
  Filled 2014-05-18 (×4): qty 1250

## 2014-05-18 MED ORDER — CHLORHEXIDINE GLUCONATE 0.12 % MT SOLN
15.0000 mL | Freq: Two times a day (BID) | OROMUCOSAL | Status: DC
  Administered 2014-05-18 – 2014-05-25 (×12): 15 mL via OROMUCOSAL
  Filled 2014-05-18 (×16): qty 15

## 2014-05-18 MED ORDER — FUROSEMIDE 10 MG/ML IJ SOLN
INTRAMUSCULAR | Status: AC
  Administered 2014-05-18: 80 mg
  Filled 2014-05-18: qty 8

## 2014-05-18 NOTE — Progress Notes (Signed)
0830- Updated Dr, Roxy Manns on patient's respiratory decline. Increased oxygen needs overnight of 12-15 L/O2 with patient's cpap. Lungs with expiratory wheezes and rhonchi throughout. Abdominal retractions noted and patient is also mouth breathing while on his home CPAP with nasal mask. Weight down 0.2 kg from yesterday. Continued pitting edema in extremities. 80- Sats dropped to 60's on 12L cpap. Changed to 100% FiO2 Bipap by respiratory therapy. Furosemide 80 mg IV given per orders. #16Fr foley catheter inserted without difficulty using sterile technique.. Clear yellow urine obtained.0930-1000- Sats staying upper 90's and patient is breathing easier. Wife and patient updated by Dr. Roxy Manns.

## 2014-05-18 NOTE — Plan of Care (Signed)
Problem: Phase II - Intermediate Post-Op Goal: Activity Progressed Outcome: Not Progressing Had decreased oxygen saturations this am and was changed to bipap. Started at 100% FiO2 and have decreased to 70%. Work of breathing has lessened and patient is tolerating bipap. Also received lasix this am and responded well. Will be starting lasix drip tonight. So patient has rested in bed today. Would not have tolerated getting up to chair.

## 2014-05-18 NOTE — Progress Notes (Addendum)
[address]       [place] [address]             [phone]        CARDIOTHORACIC SURGERY PROGRESS NOTE   R3 Days Post-Op Procedure(s) (LRB): MINIMALLY INVASIVE MITRAL VALVE (MV) REPLACEMENT (Right) MINIMALLY INVASIVE MAZE PROCEDURE (N/A) TRANSESOPHAGEAL ECHOCARDIOGRAM (TEE) (N/A)  Subjective: Feels short of breath.  O2 sats marginal overnight despite CPAP.  +productive cough.  O2 sats drop quickly when mask off.  Objective: Vital signs: BP Readings from Last 1 Encounters:  05/18/14 112/67   Pulse Readings from Last 1 Encounters:  05/18/14 79   Resp Readings from Last 1 Encounters:  05/18/14 24   Temp Readings from Last 1 Encounters:  05/18/14 97.8 F (36.6 C) Oral    Hemodynamics:    Physical Exam:  Rhythm:   Sinus w/ 3rd degree AV block, remains pacer dependent with profound bradycardia underneath - pacer thresholds good  Breath sounds: Scattered rhonchi both sides, moving good air  Heart sounds:  RRR  Incisions:  Dressings dry,intact  Abdomen:  Soft, non-distended, non-tender  Extremities:  Warm, well-perfused, swollen  Chest tubes:  Low volume thin serosanguinous output   Intake/Output from previous day: 02/25 0701 - 02/26 0700 In: 306.9 [P.O.:240; I.V.:16.9; IV Piggyback:50] Out: 1435 [Urine:1135; Chest Tube:300] Intake/Output this shift: Total I/O In: -  Out: 110 [Chest Tube:110]  Lab Results:  CBC: Recent Labs  05/17/14 0545 05/18/14 0500  WBC 26.0* 30.0*  HGB 11.6* 11.9*  HCT 35.0* 35.9*  PLT 156 168    BMET:  Recent Labs  05/17/14 0545 05/18/14 0500  NA 141 135  K 4.2 4.1  CL 108 105  CO2 27 22  GLUCOSE 130* 144*  BUN 23 29*  CREATININE 1.89* 1.99*  CALCIUM 8.8 8.8     CBG (last 3)   Recent Labs  05/17/14 0339 05/17/14 0830 05/17/14 1243  GLUCAP 116* 121* 172*    ABG    Component Value Date/Time   PHART 7.317* 05/16/2014 0516   PCO2ART 39.4 05/16/2014 0516   PO2ART 67.0* 05/16/2014 0516    HCO3 20.2 05/16/2014 0516   TCO2 18 05/16/2014 1702   ACIDBASEDEF 6.0* 05/16/2014 0516   O2SAT 91.0 05/16/2014 0516    CXR: PORTABLE CHEST - 1 VIEW  COMPARISON: Portable chest x-ray of May 17, 2014  FINDINGS: There has been interval worsening of the bilateral confluent alveolar densities. The cardiac silhouette is mildly enlarged though stable. The pulmonary vascularity is engorged and indistinct. A left atrial appendage clip and mitral prosthetic mitral valve ring are demonstrated. The 2 right-sided chest tubes are unchanged. There is no pleural effusion. The tiny right apical pneumothorax is not clearly evident today.  IMPRESSION: Worsening of bilateral confluent alveolar opacities consistent with pulmonary edema or pneumonia. ARDS is in the differential but it is somewhat early in the patient's postoperative course. The chest tubes are unchanged in position.   Electronically Signed  By: David Martinique  On: 05/18/2014 07:30  Assessment/Plan: S/P Procedure(s) (LRB): MINIMALLY INVASIVE MITRAL VALVE (MV) REPLACEMENT (Right) MINIMALLY INVASIVE MAZE PROCEDURE (N/A) TRANSESOPHAGEAL ECHOCARDIOGRAM (TEE) (N/A)  Overall stable POD3 but developing acute respiratory failure due to worsening hypoxia Likely acute exacerbation of chronic diastolic CHF +/- pneumonia +/- acute lung injury Expected post op atelectasis, mild, R>L Post op leukocytosis, worse, could be developing pneumonia OSA w/ chronic hypoxemia, hypoventilation Chronic kidney disease s/p right nephrectomy for renal cell CA, creatinine stable at baseline  Remains in complete heart block, pacer-dependent w/ very slow escape rhythm Expected post op acute blood loss anemia, very mild   Will place patient on BiPAP   IV lasix to stimulate diuresis  Start empiric Vanc + Zosyn for possible HCAP  Nebs and pulm toilet  May need Pulm/CCM consultation if resp status doesn't improve   Replace foley  Keep  NPO except meds for now  Continue lovenox for DVT prophylaxis  Continue coumadin for mechanical valve  Consult EPS - may ultimately need perm pacer  Keep back-up temporary pacer at bedside   OWEN,CLARENCE H 05/18/2014 8:42 AM

## 2014-05-18 NOTE — Plan of Care (Signed)
Problem: Phase II - Intermediate Post-Op Goal: Advance Diet Outcome: Completed/Met Date Met:  05/18/14 Pt tolerating cardiac diet Goal: Activity Progressed Outcome: Progressing Pt ambulates with PT and Primary Nurse. Pt O2 dependant with dyspnea on exertion

## 2014-05-18 NOTE — Progress Notes (Signed)
TCTS BRIEF SICU PROGRESS NOTE  3 Days Post-Op  S/P Procedure(s) (LRB): MINIMALLY INVASIVE MITRAL VALVE (MV) REPLACEMENT (Right) MINIMALLY INVASIVE MAZE PROCEDURE (N/A) TRANSESOPHAGEAL ECHOCARDIOGRAM (TEE) (N/A)   Doing much better this afternoon Breathing comfortably w/ O2 sats 97-100% on 70% FiO2 BiPAP Sinus rhythm w/ appropriate V-pacing, BP stable Diuresing remarkably well  Plan: Continue current plan  Rexene Alberts 05/18/2014 4:33 PM

## 2014-05-18 NOTE — Progress Notes (Signed)
ANTIBIOTIC CONSULT NOTE - INITIAL  Pharmacy Consult for Vancomycin and Zosyn Indication: pneumonia  Allergies  Allergen Reactions  . Contrast Media [Iodinated Diagnostic Agents] Hives and Other (See Comments)    Patient started sneezing and coughing, patient broke out in hives, patient needs 13 hour prep if having IV contrast  . Atorvastatin Other (See Comments)    Myalgias  . Fenofibrate Other (See Comments)    MYALGIAS   . Pravastatin Other (See Comments)    Myalgias     Patient Measurements: Height: 5\' 8"  (172.7 cm) Weight: 207 lb 10.8 oz (94.2 kg) IBW/kg (Calculated) : 68.4 Adjusted Body Weight:   Vital Signs: Temp: 97.8 F (36.6 C) (02/26 0700) Temp Source: Oral (02/26 0700) BP: 108/66 mmHg (02/26 0900) Pulse Rate: 80 (02/26 0900) Intake/Output from previous day: 02/25 0701 - 02/26 0700 In: 306.9 [P.O.:240; I.V.:16.9; IV Piggyback:50] Out: 1435 [Urine:1135; Chest Tube:300] Intake/Output from this shift: Total I/O In: -  Out: 435 [Urine:325; Chest Tube:110]  Labs:  Recent Labs  05/16/14 1600 05/16/14 1702 05/17/14 0545 05/18/14 0500  WBC 25.4*  --  26.0* 30.0*  HGB 11.8* 11.9* 11.6* 11.9*  PLT 148*  --  156 168  CREATININE 1.82* 1.70* 1.89* 1.99*   Estimated Creatinine Clearance: 43.4 mL/min (by C-G formula based on Cr of 1.99). No results for input(s): VANCOTROUGH, VANCOPEAK, VANCORANDOM, GENTTROUGH, GENTPEAK, GENTRANDOM, TOBRATROUGH, TOBRAPEAK, TOBRARND, AMIKACINPEAK, AMIKACINTROU, AMIKACIN in the last 72 hours.   Microbiology: No results found for this or any previous visit (from the past 720 hour(s)).  Medical History: Past Medical History  Diagnosis Date  . Hernia   . Chronic rhinitis   . Diverticulitis of colon 15 years ago  . Hyperlipidemia   . Hypogonadism male   . Leukocytosis   . Myalgia and myositis, unspecified   . Obesity   . Hypothyroidism   . PAF (paroxysmal atrial fibrillation)     a. sustained 160-180 on e-CARDIO monitor  placed 07/29/2013. Placed on IV amiodarone at end of May 2015 due to continued paroxysms with RVR.  Marland Kitchen Rheumatic heart disease   . Chronic diastolic CHF (congestive heart failure)     a. Occurs in context of AF-RVR with mitral stenosis.  . Mitral stenosis     a. Dx 07/2013 - through workup of 2D echo, TEE, and cath, felt to be moderate.  . Paroxysmal atrial flutter   . CAD (coronary artery disease)     a. By cath 08/2013: 70% distal cx-prox LPDA; tandem mod LAD lesions, o/w mild dz.  . Renal mass     a. Dx 08/2013: concerning for renal cancer.  Marland Kitchen HTN (hypertension)     hx of- under control  . Stroke 1998    "MRI showed 3 mild strokes"  . Borderline diabetes   . Depression   . Anxiety   . Dizzy spells   . Deafness in left ear   . Mitral stenosis 08/02/2013  . Lung nodule seen on imaging study 04/10/2014    5 mm nodule right lower lobe  . Dysrhythmia     atrial fib  . OSA on CPAP     cpap  . Shortness of breath dyspnea     exertion  . CKD (chronic kidney disease), stage II     his creat. runs 2-2.5; hasnt seen neph yet - sees dr. Tresa [facility] at [facility]  . 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   . Yeast dermatitis 04/19/2014  . Atrial fibrillation     Recurrent persistent atrial fibrillation  . History of shingles   . S/P minimally invasive mitral valve replacement with metallic valve and maze procedure 05/15/2014    65mm Sorin Carbomedics Optiform mechanical valve placed via right mini thoracotomy approach  . S/P Minimally invasive maze operation for atrial fibrillation 05/15/2014    Left side lesion set using cryothermy with clipping of LA appendage    Medications:  Scheduled:  . acetaminophen  1,000 mg Oral 4 times per day  . antiseptic oral rinse  7 mL  Mouth Rinse BID  . aspirin EC  325 mg Oral Daily  . bisacodyl  10 mg Rectal Daily  . enoxaparin (LOVENOX) injection  30 mg Subcutaneous QHS  . furosemide  80 mg Intravenous Once  . ipratropium-albuterol  3 mL Nebulization Q4H  . levothyroxine  75 mcg Oral QAC breakfast  . pantoprazole (PROTONIX) IV  40 mg Intravenous Q12H  . sodium chloride  3 mL Intravenous Q12H  . sodium chloride  3 mL Intravenous Q12H  . warfarin  2.5 mg Oral q1800  . Warfarin - Physician Dosing Inpatient   Does not apply q1800   Assessment: 59yo male s/p MVR on 2/24, now with worsening hypoxia & requiring BiPAP.  WBC 30- inc'd.  Cr is 1.99 & trending up; pt with single kidney s/p nephrectomy for renal CA.  Note, pre-op Cr 2.04 on 2/19.   Calculated CrCl ~62ml/min.    Goal of Therapy:  Vancomycin trough level 15-20 mcg/ml  Plan:  - Vancomycin 1250mg  IV q24 - Zosyn 3.375g IV q8, 4h infusion - steady-state Vanc trough - watch renal fxn  Gracy Bruins, [facility]

## 2014-05-18 NOTE — Plan of Care (Signed)
Problem: Phase II - Intermediate Post-Op Goal: Maintain Hemodynamic Stability Outcome: Progressing Maintaining blood pressure. Continues to need DDD pacing re chb under pacer and long pauses. Dr. Roxy Manns told patient and wife he may need a permanent pacemaker as discussed prior to surgery. Will wait a little longer. Pacemaker is working fine. Keeping and extra pacemaker generator in room at all times with a fresh battery.

## 2014-05-19 ENCOUNTER — Encounter (HOSPITAL_COMMUNITY): Payer: Self-pay | Admitting: Internal Medicine

## 2014-05-19 ENCOUNTER — Inpatient Hospital Stay (HOSPITAL_COMMUNITY): Payer: Managed Care, Other (non HMO)

## 2014-05-19 DIAGNOSIS — I481 Persistent atrial fibrillation: Secondary | ICD-10-CM

## 2014-05-19 DIAGNOSIS — Z954 Presence of other heart-valve replacement: Secondary | ICD-10-CM

## 2014-05-19 LAB — CBC
HEMATOCRIT: 33.7 % — AB (ref 39.0–52.0)
Hemoglobin: 11.2 g/dL — ABNORMAL LOW (ref 13.0–17.0)
MCH: 31.1 pg (ref 26.0–34.0)
MCHC: 33.2 g/dL (ref 30.0–36.0)
MCV: 93.6 fL (ref 78.0–100.0)
PLATELETS: 210 10*3/uL (ref 150–400)
RBC: 3.6 MIL/uL — AB (ref 4.22–5.81)
RDW: 15.2 % (ref 11.5–15.5)
WBC: 18.9 10*3/uL — AB (ref 4.0–10.5)

## 2014-05-19 LAB — COMPREHENSIVE METABOLIC PANEL
ALK PHOS: 87 U/L (ref 39–117)
ALT: 28 U/L (ref 0–53)
AST: 19 U/L (ref 0–37)
Albumin: 2.4 g/dL — ABNORMAL LOW (ref 3.5–5.2)
Anion gap: 12 (ref 5–15)
BILIRUBIN TOTAL: 0.6 mg/dL (ref 0.3–1.2)
BUN: 25 mg/dL — AB (ref 6–23)
CO2: 30 mmol/L (ref 19–32)
Calcium: 8.9 mg/dL (ref 8.4–10.5)
Chloride: 99 mmol/L (ref 96–112)
Creatinine, Ser: 1.85 mg/dL — ABNORMAL HIGH (ref 0.50–1.35)
GFR, EST AFRICAN AMERICAN: 44 mL/min — AB (ref >90)
GFR, EST NON AFRICAN AMERICAN: 38 mL/min — AB (ref >90)
GLUCOSE: 160 mg/dL — AB (ref 70–99)
Potassium: 3 mmol/L — ABNORMAL LOW (ref 3.5–5.1)
Sodium: 141 mmol/L (ref 135–145)
Total Protein: 5.8 g/dL — ABNORMAL LOW (ref 6.0–8.3)

## 2014-05-19 LAB — POTASSIUM: POTASSIUM: 3 mmol/L — AB (ref 3.5–5.1)

## 2014-05-19 LAB — PROTIME-INR
INR: 1.32 (ref 0.00–1.49)
Prothrombin Time: 16.5 s — ABNORMAL HIGH (ref 11.6–15.2)

## 2014-05-19 LAB — BRAIN NATRIURETIC PEPTIDE: B Natriuretic Peptide: 309.5 pg/mL — ABNORMAL HIGH (ref 0.0–100.0)

## 2014-05-19 MED ORDER — SODIUM CHLORIDE 0.9 % IJ SOLN
10.0000 mL | Freq: Two times a day (BID) | INTRAMUSCULAR | Status: DC
  Administered 2014-05-20 – 2014-05-22 (×5): 10 mL via INTRAVENOUS

## 2014-05-19 MED ORDER — POTASSIUM CHLORIDE 10 MEQ/100ML IV SOLN
10.0000 meq | INTRAVENOUS | Status: AC
Start: 2014-05-19 — End: 2014-05-20
  Administered 2014-05-19 (×3): 10 meq via INTRAVENOUS
  Filled 2014-05-19 (×3): qty 100

## 2014-05-19 MED ORDER — WARFARIN SODIUM 5 MG PO TABS
5.0000 mg | ORAL_TABLET | Freq: Every day | ORAL | Status: DC
  Administered 2014-05-19 – 2014-05-21 (×3): 5 mg via ORAL
  Filled 2014-05-19 (×6): qty 1

## 2014-05-19 MED ORDER — SENNOSIDES-DOCUSATE SODIUM 8.6-50 MG PO TABS
1.0000 | ORAL_TABLET | Freq: Every day | ORAL | Status: DC
  Administered 2014-05-19 – 2014-05-20 (×2): 1 via ORAL
  Filled 2014-05-19 (×5): qty 1

## 2014-05-19 MED ORDER — POTASSIUM CHLORIDE 10 MEQ/50ML IV SOLN
10.0000 meq | INTRAVENOUS | Status: DC
  Filled 2014-05-19: qty 50

## 2014-05-19 MED ORDER — SODIUM CHLORIDE 0.9 % IV SOLN: INTRAVENOUS | Status: DC

## 2014-05-19 MED ORDER — DOCUSATE SODIUM 100 MG PO CAPS
200.0000 mg | ORAL_CAPSULE | Freq: Every day | ORAL | Status: DC
  Administered 2014-05-19 – 2014-05-20 (×2): 200 mg via ORAL
  Filled 2014-05-19 (×3): qty 2

## 2014-05-19 MED ORDER — POTASSIUM CHLORIDE 10 MEQ/100ML IV SOLN
10.0000 meq | INTRAVENOUS | Status: AC
  Administered 2014-05-19 (×3): 10 meq via INTRAVENOUS
  Filled 2014-05-19 (×3): qty 100

## 2014-05-19 MED ORDER — PANTOPRAZOLE SODIUM 40 MG PO TBEC
40.0000 mg | DELAYED_RELEASE_TABLET | Freq: Every day | ORAL | Status: DC
Start: 2014-05-19 — End: 2014-05-26
  Administered 2014-05-20 – 2014-05-26 (×7): 40 mg via ORAL
  Filled 2014-05-19 (×7): qty 1

## 2014-05-19 MED ORDER — POTASSIUM CHLORIDE 10 MEQ/50ML IV SOLN
10.0000 meq | INTRAVENOUS | Status: AC
  Administered 2014-05-19 (×3): 10 meq via INTRAVENOUS
  Filled 2014-05-19 (×3): qty 50

## 2014-05-19 MED ORDER — SODIUM CHLORIDE 0.9 % IJ SOLN
10.0000 mL | INTRAMUSCULAR | Status: DC | PRN
  Administered 2014-05-20 – 2014-05-22 (×3): 10 mL via INTRAVENOUS
  Filled 2014-05-19 (×3): qty 10

## 2014-05-19 NOTE — Progress Notes (Signed)
Reiterated to pt why he needed to continue to wear his BiPAP over his home CPAP unit. Pt acknowledged.

## 2014-05-19 NOTE — Progress Notes (Signed)
TCTS BRIEF SICU PROGRESS NOTE  4 Days Post-Op  S/P Procedure(s) (LRB): MINIMALLY INVASIVE MITRAL VALVE (MV) REPLACEMENT (Right) MINIMALLY INVASIVE MAZE PROCEDURE (N/A) TRANSESOPHAGEAL ECHOCARDIOGRAM (TEE) (N/A)   Made good progress today Up in chair, oxygenation improved off BiPAP Diuresing well  Plan: Continue current plan  Bleu Minerd H 05/19/2014 6:15 PM

## 2014-05-19 NOTE — Progress Notes (Signed)
Peripherally Inserted Central Catheter/Midline Placement  The IV Nurse has discussed with the patient and/or persons authorized to consent for the patient, the purpose of this procedure and the potential benefits and risks involved with this procedure.  The benefits include less needle sticks, lab draws from the catheter and patient may be discharged home with the catheter.  Risks include, but not limited to, infection, bleeding, blood clot (thrombus formation), and puncture of an artery; nerve damage and irregular heat beat.  Alternatives to this procedure were also discussed.  PICC/Midline Placement Documentation  PICC / Midline Double Lumen 05/19/14 PICC Right Brachial 38 cm 0 cm (Active)  Indication for Insertion or Continuance of Line Administration of hyperosmolar/irritating solutions (i.e. TPN, Vancomycin, etc.);Limited venous access - need for IV therapy >5 days (PICC only);Poor Vasculature-patient has had multiple peripheral attempts or PIVs lasting less than 24 hours 05/19/2014  8:00 AM  Exposed Catheter (cm) 0 cm 05/19/2014  8:00 AM  Site Assessment Clean;Dry;Intact 05/19/2014  8:00 AM  Lumen #1 Status Flushed;Saline locked;Blood return noted 05/19/2014  8:00 AM  Lumen #2 Status Flushed;Saline locked;Blood return noted 05/19/2014  8:00 AM  Dressing Type Transparent 05/19/2014  8:00 AM  Dressing Status Clean;Dry;Intact;Antimicrobial disc in place 05/19/2014  8:00 AM  Line Care Connections checked and tightened 05/19/2014  8:00 AM  Line Adjustment (NICU/IV Team Only) No 05/19/2014  8:00 AM  Dressing Intervention New dressing 05/19/2014  8:00 AM  Dressing Change Due 05/26/14 05/19/2014  8:00 AM       Kevin English 05/19/2014, 8:37 AM

## 2014-05-19 NOTE — Consult Note (Signed)
ELECTROPHYSIOLOGY CONSULT NOTE    Primary Care Physician: Gilford Rile, MD Referring Physician:  Dr Roxy Manns  Admit Date: 05/15/2014  Reason for consultation:  AV block  Kevin English is a 59 y.o. male with a h/o persistent atrial fibrillation, mitral stenosis, and CAD s/p recent mitral valve replacement metallic valve and cryomaze with LAA clipping who has persistent postoperative complete heart block.  He continues to recover post operatively and is making slow improvements.  He is now post op day #4.  He has persistent AV block and is epicardilly paced.  His epicardial wire threshold remains low.  He has SOB which is slowly improving.  His recovery appears to be otherwise uneventful.    Past Medical History  Diagnosis Date  . Hernia   . Chronic rhinitis   . Diverticulitis of colon 15 years ago  . Hyperlipidemia   . Hypogonadism male   . Obesity   . Hypothyroidism   . Persistent atrial fibrillation     a. sustained 160-180 on e-CARDIO monitor placed 07/29/2013. Placed on IV amiodarone at end of May 2015 due to continued paroxysms with RVR.  Marland Kitchen Rheumatic heart disease   . Mitral stenosis     a. Dx 07/2013 - through workup of 2D echo, TEE, and cath, felt to be moderate.  . Paroxysmal atrial flutter   . CAD (coronary artery disease)     a. By cath 08/2013: 70% distal cx-prox LPDA; tandem mod LAD lesions, o/w mild dz.  . Renal mass     a. Dx 08/2013: concerning for renal cancer.  Marland Kitchen HTN (hypertension)     hx of- under control  . Stroke 1998    "MRI showed 3 mild strokes"  . Borderline diabetes   . Depression   . Anxiety   . Dizzy spells   . Deafness in left ear   . Lung nodule seen on imaging study 04/10/2014    5 mm nodule right lower lobe  . OSA on CPAP     cpap  . CKD (chronic kidney disease), stage II     his creat. runs 2-2.5; hasnt seen neph yet - sees dr. Tresa [facility] at [facility]  . Renal cell carcinoma of right kidney 09/05/2013    clear cell renal cell carcinoma of right kidney  treated by radical nephrectomy, Fuhrman grade 2-3, with maximum tumor diameter 2.7 cm, all tumor to find confined to the kidney, and all surgical margins negative (T1aN0).   . Yeast dermatitis 04/19/2014  . History of shingles   . S/P minimally invasive mitral valve replacement with metallic valve and maze procedure 05/15/2014    36mm Sorin [employer] Optiform mechanical valve placed via right mini thoracotomy approach  . S/P Minimally invasive maze operation for atrial fibrillation 05/15/2014    Left side lesion set using cryothermy with clipping of LA appendage   Past Surgical History  Procedure Laterality Date  . Tee without cardioversion N/A 08/16/2013    Procedure: TRANSESOPHAGEAL ECHOCARDIOGRAM (TEE);  Surgeon: Dorothy Spark, MD;  Location: [place];  Service: Cardiovascular;  Laterality: N/A;  . Inguinal hernia repair Bilateral first one in 80's    "I had one side done twice"  . Cardiac catheterization  08/28/13  . Eye surgery Right 1990    "reconstructive, lens implant- from paintball accident"  . Robot assisted laparoscopic nephrectomy Right 09/05/2013    Procedure: ROBOTIC ASSISTED LAPAROSCOPIC RIGHT RADICAL NEPHRECTOMY;  Surgeon: Sharyn Creamer, MD;  Location: [facility];  Service: Urology;  Laterality: Right;  .  Left and right heart catheterization with coronary angiogram N/A 08/28/2013    Procedure: LEFT AND RIGHT HEART CATHETERIZATION WITH CORONARY ANGIOGRAM;  Surgeon: Leonie Man, MD;  Location: [facility] CATH LAB;  Service: Cardiovascular;  Laterality: N/A;  . Mitral valve replacement Right 05/15/2014    Procedure: MINIMALLY INVASIVE MITRAL VALVE (MV) REPLACEMENT;  Surgeon: Rexene Alberts, MD;  Location: [place];  Service: Open Heart Surgery;  Laterality: Right;  . Minimally invasive maze procedure N/A 05/15/2014    Procedure: MINIMALLY INVASIVE MAZE PROCEDURE;  Surgeon: Rexene Alberts, MD;  Location: [place];  Service: Open Heart Surgery;  Laterality: N/A;  . Tee without  cardioversion N/A 05/15/2014    Procedure: TRANSESOPHAGEAL ECHOCARDIOGRAM (TEE);  Surgeon: Rexene Alberts, MD;  Location: [place];  Service: Open Heart Surgery;  Laterality: N/A;    . acetaminophen  1,000 mg Oral 4 times per day  . antiseptic oral rinse  7 mL Mouth Rinse q12n4p  . aspirin EC  325 mg Oral Daily  . chlorhexidine  15 mL Mouth Rinse BID  . docusate sodium  200 mg Oral Daily  . enoxaparin (LOVENOX) injection  30 mg Subcutaneous QHS  . ipratropium-albuterol  3 mL Nebulization Q4H  . levothyroxine  75 mcg Oral QAC breakfast  . pantoprazole  40 mg Oral Daily  . piperacillin-tazobactam (ZOSYN)  IV  3.375 g Intravenous 3 times per day  . potassium chloride  10 mEq Intravenous Q1 Hr x 3  . senna-docusate  1 tablet Oral Daily  . sodium chloride  10 mL Intravenous Q12H  . vancomycin  1,250 mg Intravenous Q24H  . warfarin  5 mg Oral q1800  . Warfarin - Physician Dosing Inpatient   Does not apply q1800   . sodium chloride    . furosemide (LASIX) infusion 10 mg/hr (05/18/14 1945)    Allergies  Allergen Reactions  . Contrast Media [Iodinated Diagnostic Agents] Hives and Other (See Comments)    Patient started sneezing and coughing, patient broke out in hives, patient needs 13 hour prep if having IV contrast  . Atorvastatin Other (See Comments)    Myalgias  . Fenofibrate Other (See Comments)    MYALGIAS   . Pravastatin Other (See Comments)    Myalgias     History   Social History  . Marital Status: Married    Spouse Name: N/A  . Number of Children: N/A  . Years of Education: N/A   Occupational History  . Not on file.   Social History Main Topics  . Smoking status: Current Every Day Smoker -- 0.25 packs/day for 40 years    Types: Cigarettes  . Smokeless tobacco: Never Used  . Alcohol Use: No  . Drug Use: No  . Sexual Activity: Not Currently   Other Topics Concern  . Not on file   Social History Narrative    Family History  Problem Relation Age of Onset    . Hypertension    . Hypertension Mother     ROS- All systems are reviewed and negative except as per the HPI above  Physical Exam: Telemetry: sinus rhythm with V pacing Filed Vitals:   05/19/14 1800 05/19/14 1900 05/19/14 1945 05/19/14 2000  BP: 115/69 132/77 132/77 121/66  Pulse: 87 93 85 88  Temp:    97.8 F (36.6 C)  TempSrc:    Axillary  Resp: 27 18 21 21   Height:      Weight:      SpO2: 91% 94% 94%  94%    GEN- The patient is ill appearing, alert and oriented x 3 today.   Wife is at bedside Head- normocephalic, atraumatic Eyes-  Sclera clear, conjunctiva pink Ears- hearing intact Oropharynx- clear Neck- supple,   Lungs- chest tubes in place,  Decreased R sided BS Heart- Regular rate and rhythm (paced) GI- soft, NT, ND, + BS Extremities- no clubbing, cyanosis, + dependant edema MS- no significant deformity or atrophy Skin- no rash or lesion Psych- euthymic mood, full affect Neuro- strength and sensation are intact  EKG 1/16 reveals sinus rhythm with RBBB  Multiple prior TEEs and Echos from 2015 are reviewed and reveal preserved EF at that time  Labs:   Lab Results  Component Value Date   WBC 18.9* 05/19/2014   HGB 11.2* 05/19/2014   HCT 33.7* 05/19/2014   MCV 93.6 05/19/2014   PLT 210 05/19/2014    Recent Labs Lab 05/19/14 0334 05/19/14 1830  NA 141  --   K 3.0* 3.0*  CL 99  --   CO2 30  --   BUN 25*  --   CREATININE 1.85*  --   CALCIUM 8.9  --   PROT 5.8*  --   BILITOT 0.6  --   ALKPHOS 87  --   ALT 28  --   AST 19  --   GLUCOSE 160*  --    Lab Results  Component Value Date   CKTOTAL 64 12/22/2013   TROPONINI <0.30 08/18/2013    Lab Results  Component Value Date   CHOL 280* 01/15/2014   Lab Results  Component Value Date   HDL 23.20* 01/15/2014   No results found for: [facility] Lab Results  Component Value Date   TRIG 572.0* 01/15/2014   Lab Results  Component Value Date   CHOLHDL 12 01/15/2014   Lab Results  Component  Value Date   LDLDIRECT 148.2 01/15/2014      ASSESSMENT AND PLAN:   1. Complete heart block The patient has persistent complete heart block s/p Mitral replacement.  He is post op day #4.  I have spoken with Dr Roxy Manns today regarding the patients progress and potential timing of PPM.  Ideally, his conduction may recover.  As long as his epicardial pacing wires are intact, I would favor waiting for recovery of AV nodal function.  This will also allow for him to clinically recover from surgery and for SOB to improve. I will check on him again on Monday.  I would anticipate pacemaker implantation either Tues or Wed depending on his progress.  2. Persistent afib chads2vasc score of at least 4.  He will require anticoagulation long term with his replacement mitral valve Continue to follow clinically  Call with questions  Thompson Grayer, MD 05/19/2014  9:03 PM

## 2014-05-19 NOTE — Progress Notes (Signed)
[address]       [place] [address]             [phone]        CARDIOTHORACIC SURGERY PROGRESS NOTE   R4 Days Post-Op Procedure(s) (LRB): MINIMALLY INVASIVE MITRAL VALVE (MV) REPLACEMENT (Right) MINIMALLY INVASIVE MAZE PROCEDURE (N/A) TRANSESOPHAGEAL ECHOCARDIOGRAM (TEE) (N/A)  Subjective: Feels better.  Breathing much easier although still on BiPAP.  Feels hungry.  Denies pain.  Objective: Vital signs: BP Readings from Last 1 Encounters:  05/19/14 131/59   Pulse Readings from Last 1 Encounters:  05/19/14 84   Resp Readings from Last 1 Encounters:  05/19/14 24   Temp Readings from Last 1 Encounters:  05/19/14 98.6 F (37 C) Oral    Hemodynamics:    Physical Exam:  Rhythm:   Sinus w/ V-pacing.  Remains in CHB with very slow escape.  Pacer thresholds excellent  Breath sounds: Fairly clear  Heart sounds:  RRR  Incisions:  Clean and dry  Abdomen:  Soft, non-distended, non-tender  Extremities:  Warm, well-perfused  Chest tubes:  Low volume thin serosanguinous output, no air leak    Intake/Output from previous day: 02/26 0701 - 02/27 0700 In: 722.5 [P.O.:90; I.V.:232.5; IV Piggyback:400] Out: 5620 [Urine:5360; Chest Tube:260] Intake/Output this shift: Total I/O In: 70 [I.V.:20; IV Piggyback:50] Out: 20 [Chest Tube:20]  Lab Results:  CBC: Recent Labs  05/18/14 0500 05/19/14 0334  WBC 30.0* 18.9*  HGB 11.9* 11.2*  HCT 35.9* 33.7*  PLT 168 210    BMET:  Recent Labs  05/18/14 0500 05/19/14 0334  NA 135 141  K 4.1 3.0*  CL 105 99  CO2 22 30  GLUCOSE 144* 160*  BUN 29* 25*  CREATININE 1.99* 1.85*  CALCIUM 8.8 8.9     CBG (last 3)   Recent Labs  05/17/14 0339 05/17/14 0830 05/17/14 1243  GLUCAP 116* 121* 172*    ABG    Component Value Date/Time   PHART 7.317* 05/16/2014 0516   PCO2ART 39.4 05/16/2014 0516   PO2ART 67.0* 05/16/2014 0516   HCO3 20.2 05/16/2014 0516   TCO2 18 05/16/2014 1702   ACIDBASEDEF 6.0* 05/16/2014 0516   O2SAT 91.0 05/16/2014 0516    CXR: PORTABLE CHEST - 1 VIEW  COMPARISON: 05/18/2014  FINDINGS: Postsurgical changes are again noted. Two right thoracostomy catheters are again seen. No pneumothorax is noted. Patchy changes are again seen within the lungs bilaterally but slightly improved when compare with the prior exam. No sizable effusion is noted.  IMPRESSION: Bilateral patchy infiltrates slightly improved when compare with the previous day. Continued followup is recommended.   Electronically Signed  By: Inez Catalina M.D.  On: 05/19/2014 07:27  Assessment/Plan: S/P Procedure(s) (LRB): MINIMALLY INVASIVE MITRAL VALVE (MV) REPLACEMENT (Right) MINIMALLY INVASIVE MAZE PROCEDURE (N/A) TRANSESOPHAGEAL ECHOCARDIOGRAM (TEE) (N/A)  Overall stable POD4  Respiratory status and oxygenation much improved over last 24 hrs Likely acute exacerbation of chronic diastolic CHF +/- pneumonia +/- acute lung injury Expected post op volume excess, diuresing very well on lasix drip - negative 5 liters yesterday Expected post op atelectasis, mild, R>L Post op leukocytosis, much improved OSA w/ chronic hypoxemia, hypoventilation Chronic kidney disease s/p right nephrectomy for renal cell CA, creatinine stable at baseline Remains in complete heart block, pacer-dependent w/ very slow escape rhythm Expected post op acute blood loss anemia, very mild   Wean FiO2 and BiPAP as tolerated  Continue lasix to stimulate diuresis  Continue empiric Vanc +  Zosyn for possible HCAP  Nebs and pulm toilet  Mobilize out of bed  Continue foley  Resume oral diet  Continue lovenox for DVT prophylaxis  Increase coumadin for mechanical valve  Consult EPS - may ultimately need perm pacer  Keep back-up temporary pacer at bedside  Kevin English 05/19/2014 10:22 AM

## 2014-05-20 ENCOUNTER — Inpatient Hospital Stay (HOSPITAL_COMMUNITY): Payer: Managed Care, Other (non HMO)

## 2014-05-20 LAB — POCT I-STAT 3, ART BLOOD GAS (G3+)
Acid-Base Excess: 6 mmol/L — ABNORMAL HIGH (ref 0.0–2.0)
Bicarbonate: 30.1 mEq/L — ABNORMAL HIGH (ref 20.0–24.0)
O2 Saturation: 95 %
Patient temperature: 97.4
TCO2: 31 mmol/L (ref 0–100)
pCO2 arterial: 38.5 mmHg (ref 35.0–45.0)
pH, Arterial: 7.498 — ABNORMAL HIGH (ref 7.350–7.450)
pO2, Arterial: 67 mmHg — ABNORMAL LOW (ref 80.0–100.0)

## 2014-05-20 LAB — PROTIME-INR
INR: 1.5 — ABNORMAL HIGH (ref 0.00–1.49)
Prothrombin Time: 18.2 seconds — ABNORMAL HIGH (ref 11.6–15.2)

## 2014-05-20 LAB — BASIC METABOLIC PANEL
Anion gap: 7 (ref 5–15)
BUN: 22 mg/dL (ref 6–23)
CO2: 36 mmol/L — ABNORMAL HIGH (ref 19–32)
CREATININE: 1.84 mg/dL — AB (ref 0.50–1.35)
Calcium: 8.8 mg/dL (ref 8.4–10.5)
Chloride: 97 mmol/L (ref 96–112)
GFR, EST AFRICAN AMERICAN: 44 mL/min — AB (ref >90)
GFR, EST NON AFRICAN AMERICAN: 38 mL/min — AB (ref >90)
Glucose, Bld: 145 mg/dL — ABNORMAL HIGH (ref 70–99)
Potassium: 3 mmol/L — ABNORMAL LOW (ref 3.5–5.1)
SODIUM: 140 mmol/L (ref 135–145)

## 2014-05-20 LAB — POTASSIUM
Potassium: 3.2 mmol/L — ABNORMAL LOW (ref 3.5–5.1)
Potassium: 3.5 mmol/L (ref 3.5–5.1)

## 2014-05-20 MED ORDER — ALPRAZOLAM 0.25 MG PO TABS
0.2500 mg | ORAL_TABLET | Freq: Every evening | ORAL | Status: DC | PRN
  Administered 2014-05-20 – 2014-05-25 (×3): 0.25 mg via ORAL
  Filled 2014-05-20 (×3): qty 1

## 2014-05-20 MED ORDER — POTASSIUM CHLORIDE 10 MEQ/50ML IV SOLN
10.0000 meq | INTRAVENOUS | Status: DC
Start: 2014-05-20 — End: 2014-05-20

## 2014-05-20 MED ORDER — POTASSIUM CHLORIDE 10 MEQ/50ML IV SOLN
10.0000 meq | INTRAVENOUS | Status: AC
  Administered 2014-05-20 – 2014-05-21 (×3): 10 meq via INTRAVENOUS
  Filled 2014-05-20 (×3): qty 50

## 2014-05-20 MED ORDER — SERTRALINE HCL 50 MG PO TABS
50.0000 mg | ORAL_TABLET | Freq: Every day | ORAL | Status: DC
  Administered 2014-05-20 – 2014-05-26 (×7): 50 mg via ORAL
  Filled 2014-05-20 (×7): qty 1

## 2014-05-20 MED ORDER — POTASSIUM CHLORIDE 10 MEQ/50ML IV SOLN
10.0000 meq | INTRAVENOUS | Status: AC
  Administered 2014-05-20 (×3): 10 meq via INTRAVENOUS
  Filled 2014-05-20 (×3): qty 50

## 2014-05-20 MED ORDER — POTASSIUM CHLORIDE 10 MEQ/50ML IV SOLN
10.0000 meq | INTRAVENOUS | Status: AC
  Administered 2014-05-20 (×3): 10 meq via INTRAVENOUS
  Filled 2014-05-20: qty 50

## 2014-05-20 NOTE — Progress Notes (Signed)
Pt had refused to wear his BiPAP and had been placed on his NRB (which he wants to hold on himself). RN and RRT have explained the situation to him that he needs to keep it on, and pt is somewhat compliant. Pt has low oxygen reserves, and desats easily.

## 2014-05-20 NOTE — Progress Notes (Signed)
Increased O2 bleed in to CPAP to 15L due to O2 sats of 86-87%. Pt tolerating at this time. RT will continue to monitor.

## 2014-05-20 NOTE — Progress Notes (Signed)
[address]       [place] [address]             [phone]        CARDIOTHORACIC SURGERY PROGRESS NOTE   R5 Days Post-Op Procedure(s) (LRB): MINIMALLY INVASIVE MITRAL VALVE (MV) REPLACEMENT (Right) MINIMALLY INVASIVE MAZE PROCEDURE (N/A) TRANSESOPHAGEAL ECHOCARDIOGRAM (TEE) (N/A)  Subjective: More anxious today.  Didn't sleep last night.  Denies pain.  No specific complaints - states that he "just doesn't feel well" and he thought he would be home by now  Objective: Vital signs: BP Readings from Last 1 Encounters:  05/20/14 123/81   Pulse Readings from Last 1 Encounters:  05/20/14 93   Resp Readings from Last 1 Encounters:  05/20/14 26   Temp Readings from Last 1 Encounters:  05/20/14 99.2 F (37.3 C) Axillary    Hemodynamics:    Physical Exam:  Rhythm:   Sinus w/ V-pacing.  Remains pacer dependent.  V-wire threshold 1 mV  Breath sounds: Few rhonchi  Heart sounds:  RRR  Incisions:  Clean and dry  Abdomen:  Soft, non-distended, non-tender  Extremities:  Warm, well-perfused  Chest tubes:  Low volume thin serosanguinous output, no air leak   Intake/Output from previous day: 02/27 0701 - 02/28 0700 In: 2075 [P.O.:630; I.V.:420; IV Piggyback:1025] Out: 0102 [Urine:5050; Chest Tube:140] Intake/Output this shift: Total I/O In: 55 [I.V.:30; IV Piggyback:25] Out: 500 [Urine:500]  Lab Results:  CBC: Recent Labs  05/18/14 0500 05/19/14 0334  WBC 30.0* 18.9*  HGB 11.9* 11.2*  HCT 35.9* 33.7*  PLT 168 210    BMET:  Recent Labs  05/19/14 0334  05/20/14 0215 05/20/14 0840  NA 141  --  140  --   K 3.0*  < > 3.0* 3.2*  CL 99  --  97  --   CO2 30  --  36*  --   GLUCOSE 160*  --  145*  --   BUN 25*  --  22  --   CREATININE 1.85*  --  1.84*  --   CALCIUM 8.9  --  8.8  --   < > = values in this interval not displayed.   CBG (last 3)   Recent Labs  05/17/14 1243  GLUCAP 172*    ABG    Component Value Date/Time   PHART  7.498* 05/20/2014 0748   PCO2ART 38.5 05/20/2014 0748   PO2ART 67.0* 05/20/2014 0748   HCO3 30.1* 05/20/2014 0748   TCO2 31 05/20/2014 0748   ACIDBASEDEF 6.0* 05/16/2014 0516   O2SAT 95.0 05/20/2014 0748    CXR: PORTABLE CHEST - 1 VIEW  COMPARISON: Chest radiograph 05/19/2014  FINDINGS: Right chest tube and right PICC line unchanged. No pneumothorax. Stable cardiac silhouette. There is bilateral diffuse airspace disease.  IMPRESSION: 1. Right chest tube in place without pneumothorax. 2. Bilateral airspace disease concerning for multifocal pneumonia. 3. No significant change.   Electronically Signed  By: Suzy Bouchard M.D.  On: 05/20/2014 08:55   Assessment/Plan: S/P Procedure(s) (LRB): MINIMALLY INVASIVE MITRAL VALVE (MV) REPLACEMENT (Right) MINIMALLY INVASIVE MAZE PROCEDURE (N/A) TRANSESOPHAGEAL ECHOCARDIOGRAM (TEE) (N/A)  Overall stable POD5  Respiratory status and oxygenation stable over last 24 hrs but still desaturates quickly w/ O2 off Likely acute exacerbation of chronic diastolic CHF +/- pneumonia +/- acute lung injury Expected post op volume excess, diuresing very well on lasix drip - negative 3 liters again yesterday, down 9 liters over last 3 days and weight now below  baseline Expected post op atelectasis, mild, R>L Post op leukocytosis OSA w/ chronic hypoxemia, hypoventilation Chronic kidney disease s/p right nephrectomy for renal cell CA, creatinine stable at baseline Remains in complete heart block, pacer-dependent w/ very slow escape rhythm Expected post op acute blood loss anemia, very mild   Wean FiO2 as tolerated  D/C chest tubes today  Stop lasix drip and d/c foley  Continue empiric Vanc + Zosyn for possible HCAP  Nebs and pulm toilet  Mobilize out of bed  Continue lovenox for DVT prophylaxis until INR 2.0  Continue coumadin for mechanical valve  Will need perm pacer early this week  Keep back-up temporary pacer at  bedside  Augie Vane H 05/20/2014 10:46 AM

## 2014-05-20 NOTE — Progress Notes (Signed)
Pt still insisting on wearing home CPAP. Explained to pt that he could try the CPAP on the one condition that if he began to desat and have increased WOB that he would agree to go back on BIPAP. Pt agrees to condition. Pt appears more comfortable at this time, is wearing home CPAP with 10L O2 bled in. RT/RN will continue to monitor.

## 2014-05-20 NOTE — Progress Notes (Signed)
[facility] BRIEF SICU PROGRESS NOTE  5 Days Post-Op  S/P Procedure(s) (LRB): MINIMALLY INVASIVE MITRAL VALVE (MV) REPLACEMENT (Right) MINIMALLY INVASIVE MAZE PROCEDURE (N/A) TRANSESOPHAGEAL ECHOCARDIOGRAM (TEE) (N/A)   Had a good day Spent most of the day up and out of bed, currently sleeping O2 sats 100% Still diuresing well  Plan: Continue current plan  Rexene Alberts 05/20/2014 5:17 PM

## 2014-05-20 NOTE — Progress Notes (Signed)
ANTIBIOTIC CONSULT NOTE - FOLLOW UP  Pharmacy Consult for Vancomycin + Zosyn Indication: rule out pneumonia  Allergies  Allergen Reactions  . Contrast Media [Iodinated Diagnostic Agents] Hives and Other (See Comments)    Patient started sneezing and coughing, patient broke out in hives, patient needs 13 hour prep if having IV contrast  . Atorvastatin Other (See Comments)    Myalgias  . Fenofibrate Other (See Comments)    MYALGIAS   . Pravastatin Other (See Comments)    Myalgias     Patient Measurements: Height: 5\' 8"  (172.7 cm) Weight: 193 lb 5.5 oz (87.7 kg) IBW/kg (Calculated) : 68.4   Vital Signs: Temp: 99.2 F (37.3 C) (02/28 0844) Temp Source: Axillary (02/28 0844) BP: 123/81 mmHg (02/28 1000) Pulse Rate: 93 (02/28 1000) Intake/Output from previous day: 02/27 0701 - 02/28 0700 In: 2075 [P.O.:630; I.V.:420; IV Piggyback:1025] Out: 4696 [Urine:5050; Chest Tube:140] Intake/Output from this shift: Total I/O In: 55 [I.V.:30; IV Piggyback:25] Out: 500 [Urine:500]  Labs:  Recent Labs  05/18/14 0500 05/19/14 0334 05/20/14 0215  WBC 30.0* 18.9*  --   HGB 11.9* 11.2*  --   PLT 168 210  --   CREATININE 1.99* 1.85* 1.84*   Estimated Creatinine Clearance: 45.4 mL/min (by C-G formula based on Cr of 1.84). No results for input(s): VANCOTROUGH, VANCOPEAK, VANCORANDOM, GENTTROUGH, GENTPEAK, GENTRANDOM, TOBRATROUGH, TOBRAPEAK, TOBRARND, AMIKACINPEAK, AMIKACINTROU, AMIKACIN in the last 72 hours.   Microbiology: No results found for this or any previous visit (from the past 720 hour(s)).  Anti-infectives    Start     Dose/Rate Route Frequency Ordered Stop   05/18/14 1030  vancomycin (VANCOCIN) 1,250 mg in sodium chloride 0.9 % 250 mL IVPB     1,250 mg 166.7 mL/hr over 90 Minutes Intravenous Every 24 hours 05/18/14 0945     05/18/14 1015  piperacillin-tazobactam (ZOSYN) IVPB 3.375 g     3.375 g 12.5 mL/hr over 240 Minutes Intravenous 3 times per day 05/18/14 0945      05/15/14 2215  vancomycin (VANCOCIN) IVPB 1000 mg/200 mL premix     1,000 mg 200 mL/hr over 60 Minutes Intravenous  Once 05/15/14 1506 05/15/14 2327   05/15/14 2100  cefUROXime (ZINACEF) 1.5 g in dextrose 5 % 50 mL IVPB     1.5 g 100 mL/hr over 30 Minutes Intravenous Every 12 hours 05/15/14 1506 05/17/14 0932   05/15/14 0400  vancomycin (VANCOCIN) 1,500 mg in sodium chloride 0.9 % 250 mL IVPB     1,500 mg 125 mL/hr over 120 Minutes Intravenous To Surgery 05/14/14 1258 05/15/14 0830   05/15/14 0400  cefUROXime (ZINACEF) 1.5 g in dextrose 5 % 50 mL IVPB     1.5 g 100 mL/hr over 30 Minutes Intravenous To Surgery 05/14/14 1258 05/15/14 1252   05/15/14 0400  cefUROXime (ZINACEF) 750 mg in dextrose 5 % 50 mL IVPB  Status:  Discontinued     750 mg 100 mL/hr over 30 Minutes Intravenous To Surgery 05/14/14 1258 05/15/14 1506   05/15/14 0400  vancomycin (VANCOCIN) 1,000 mg in sodium chloride 0.9 % 1,000 mL irrigation      Irrigation To Surgery 05/14/14 1258 05/15/14 1006      Assessment: 60 YOM who continues on Vancomycin + Zosyn for empiric HCAP coverage. Renal function remains stable - doses are appropriate for now. Will plan to check a Vancomycin trough at steady state on 2/29.  Goal of Therapy:  Vancomycin trough level 15-20 mcg/ml  Proper antibiotics for infection/cultures adjusted for  renal/hepatic function   Plan:  1. Continue Vancomycin 1250 mg IV every 24 hours 2. Continue Zosyn 3.375g IV every 8 hours (infused over 4 hours) 3. Will continue to follow renal function, culture results, LOT, and antibiotic de-escalation plans   Alycia Rossetti, PharmD, BCPS Clinical Pharmacist Pager: [phone] 05/20/2014 11:54 AM

## 2014-05-21 ENCOUNTER — Inpatient Hospital Stay (HOSPITAL_COMMUNITY): Payer: Managed Care, Other (non HMO)

## 2014-05-21 DIAGNOSIS — R0602 Shortness of breath: Secondary | ICD-10-CM

## 2014-05-21 DIAGNOSIS — Z9889 Other specified postprocedural states: Secondary | ICD-10-CM

## 2014-05-21 LAB — CBC
HEMATOCRIT: 32.7 % — AB (ref 39.0–52.0)
Hemoglobin: 10.6 g/dL — ABNORMAL LOW (ref 13.0–17.0)
MCH: 30.8 pg (ref 26.0–34.0)
MCHC: 32.4 g/dL (ref 30.0–36.0)
MCV: 95.1 fL (ref 78.0–100.0)
PLATELETS: 265 10*3/uL (ref 150–400)
RBC: 3.44 MIL/uL — ABNORMAL LOW (ref 4.22–5.81)
RDW: 15.8 % — ABNORMAL HIGH (ref 11.5–15.5)
WBC: 17.6 10*3/uL — AB (ref 4.0–10.5)

## 2014-05-21 LAB — PROTIME-INR
INR: 1.91 — ABNORMAL HIGH (ref 0.00–1.49)
PROTHROMBIN TIME: 22.1 s — AB (ref 11.6–15.2)

## 2014-05-21 LAB — COMPREHENSIVE METABOLIC PANEL
ALK PHOS: 86 U/L (ref 39–117)
ALT: 18 U/L (ref 0–53)
AST: 15 U/L (ref 0–37)
Albumin: 2.1 g/dL — ABNORMAL LOW (ref 3.5–5.2)
Anion gap: 6 (ref 5–15)
BUN: 30 mg/dL — ABNORMAL HIGH (ref 6–23)
CALCIUM: 9.6 mg/dL (ref 8.4–10.5)
CHLORIDE: 100 mmol/L (ref 96–112)
CO2: 35 mmol/L — ABNORMAL HIGH (ref 19–32)
CREATININE: 1.85 mg/dL — AB (ref 0.50–1.35)
GFR calc Af Amer: 44 mL/min — ABNORMAL LOW (ref >90)
GFR calc non Af Amer: 38 mL/min — ABNORMAL LOW (ref >90)
Glucose, Bld: 116 mg/dL — ABNORMAL HIGH (ref 70–99)
Potassium: 3.6 mmol/L (ref 3.5–5.1)
Sodium: 141 mmol/L (ref 135–145)
Total Bilirubin: 0.6 mg/dL (ref 0.3–1.2)
Total Protein: 6.1 g/dL (ref 6.0–8.3)

## 2014-05-21 LAB — VANCOMYCIN, TROUGH: VANCOMYCIN TR: 11.2 ug/mL (ref 10.0–20.0)

## 2014-05-21 MED ORDER — CEFAZOLIN SODIUM-DEXTROSE 2-3 GM-% IV SOLR
2.0000 g | INTRAVENOUS | Status: AC
  Administered 2014-05-22: 2 g via INTRAVENOUS
  Filled 2014-05-21 (×2): qty 50

## 2014-05-21 MED ORDER — GENTAMICIN SULFATE 40 MG/ML IJ SOLN
80.0000 mg | INTRAMUSCULAR | Status: DC
  Filled 2014-05-21: qty 2

## 2014-05-21 MED ORDER — FUROSEMIDE 10 MG/ML IJ SOLN
40.0000 mg | Freq: Once | INTRAMUSCULAR | Status: AC
  Administered 2014-05-21: 40 mg via INTRAVENOUS
  Filled 2014-05-21: qty 4

## 2014-05-21 MED ORDER — SODIUM CHLORIDE 0.45 % IV SOLN
INTRAVENOUS | Status: DC
  Administered 2014-05-22: 13:00:00 via INTRAVENOUS

## 2014-05-21 MED ORDER — IPRATROPIUM-ALBUTEROL 0.5-2.5 (3) MG/3ML IN SOLN
3.0000 mL | Freq: Four times a day (QID) | RESPIRATORY_TRACT | Status: DC
  Administered 2014-05-22 – 2014-05-24 (×8): 3 mL via RESPIRATORY_TRACT
  Filled 2014-05-21 (×8): qty 3

## 2014-05-21 MED ORDER — POTASSIUM CHLORIDE 10 MEQ/50ML IV SOLN
10.0000 meq | INTRAVENOUS | Status: AC
  Administered 2014-05-21 (×3): 10 meq via INTRAVENOUS
  Filled 2014-05-21 (×3): qty 50

## 2014-05-21 MED ORDER — SODIUM CHLORIDE 0.9 % IV SOLN
INTRAVENOUS | Status: DC
  Administered 2014-05-22: 13:00:00 via INTRAVENOUS

## 2014-05-21 MED ORDER — VANCOMYCIN HCL IN DEXTROSE 750-5 MG/150ML-% IV SOLN
750.0000 mg | Freq: Two times a day (BID) | INTRAVENOUS | Status: DC
  Administered 2014-05-21 – 2014-05-25 (×9): 750 mg via INTRAVENOUS
  Filled 2014-05-21 (×11): qty 150

## 2014-05-21 MED ORDER — POTASSIUM CHLORIDE CRYS ER 20 MEQ PO TBCR
40.0000 meq | EXTENDED_RELEASE_TABLET | Freq: Once | ORAL | Status: AC
  Administered 2014-05-21: 40 meq via ORAL

## 2014-05-21 MED ORDER — FUROSEMIDE 40 MG PO TABS
40.0000 mg | ORAL_TABLET | Freq: Every day | ORAL | Status: DC
  Administered 2014-05-21: 40 mg via ORAL
  Filled 2014-05-21: qty 1

## 2014-05-21 NOTE — Progress Notes (Signed)
Patient has been up in chair until 1400. He cooperatively worked with PT and walked in placed. He became short of breath and sat dropped in which PT stopped and allowed patient to relax. He is currently in bed but has agreed to get back out of bed for dinner. Will continue to monitor. Richardean Sale, RN

## 2014-05-21 NOTE — Progress Notes (Signed)
UR Completed.  [phone]  

## 2014-05-21 NOTE — Progress Notes (Signed)
Up in chair  No complaints at present  On partial rebreather  BP 119/75 mmHg  Pulse 95  Temp(Src) 97.9 F (36.6 C) (Axillary)  Resp 25  Ht 5\' 8"  (1.727 m)  Wt 192 lb 3.9 oz (87.2 kg)  BMI 29.24 kg/m2  SpO2 94%   Intake/Output Summary (Last 24 hours) at 05/21/14 1916 Last data filed at 05/21/14 1600  Gross per 24 hour  Intake 1652.5 ml  Output   1301 ml  Net  351.5 ml    Continue present care

## 2014-05-21 NOTE — Progress Notes (Signed)
Physical Therapy Treatment Patient Details Name: Kevin English MRN: [id number] DOB: 1956/01/01 Today's Date: 05/21/2014    History of Present Illness Pt s/p mini MVR with hx of CHF, AFib, OSA periods of dizziness over the last few years. Pt with home CPAP use.    PT Comments    Progressing slowly towards physical therapy goals. Able to tolerate therapeutic exercises today, standing x4 minutes with SpO2 91-94% on 15L supplemental O2 with non-rebreather mask HR 90 bpm. Pt anxious and declines walking due to shortness of breath, but will continue to encourage him to progress his mobility as able. Patient will continue to benefit from skilled physical therapy services to further improve independence with functional mobility.   Follow Up Recommendations  CIR     Equipment Recommendations  Rolling walker with 5" wheels;3in1 (PT)    Recommendations for Other Services OT consult     Precautions / Restrictions Precautions Precautions: Fall Precaution Comments: watch sats, pacer dependent    Mobility  Bed Mobility               General bed mobility comments: In chair at start of therapy.  Transfers Overall transfer level: Needs assistance Equipment used: Rolling walker (2 wheeled) Transfers: Sit to/from Stand Sit to Stand: Min assist         General transfer comment: Min assist for stability. Cues for hand placement. From reclining chair.  Ambulation/Gait                 Stairs            Wheelchair Mobility    Modified Rankin (Stroke Patients Only)       Balance Overall balance assessment: Needs assistance Sitting-balance support: No upper extremity supported;Feet supported Sitting balance-Leahy Scale: Fair     Standing balance support: Bilateral upper extremity supported Standing balance-Leahy Scale: Poor Standing balance comment: Standing balance required min assist intermittently for Loss of balance to posterior. Tolerated x4 minutes                     Cognition Arousal/Alertness: Awake/alert Behavior During Therapy: Anxious Overall Cognitive Status: Within Functional Limits for tasks assessed                      Exercises General Exercises - Lower Extremity Ankle Circles/Pumps: AROM;Both;10 reps;Seated Quad Sets: Strengthening;Both;10 reps;Seated Long Arc Quad: Strengthening;Both;10 reps;Seated Hip Flexion/Marching: Strengthening;Both;10 reps;Seated;Standing (x10 standing, x10 seated)    General Comments General comments (skin integrity, edema, etc.): Pt declines to attempt gait training today. Appears anxious due to difficulty with breathing.      Pertinent Vitals/Pain Pain Assessment: No/denies pain    Home Living                      Prior Function            PT Goals (current goals can now be found in the care plan section) Acute Rehab PT Goals PT Goal Formulation: With patient Time For Goal Achievement: 05/31/14 Potential to Achieve Goals: Good Progress towards PT goals: Progressing toward goals    Frequency  Min 3X/week    PT Plan Current plan remains appropriate    Co-evaluation             End of Session Equipment Utilized During Treatment: Gait belt;Oxygen Activity Tolerance: Patient limited by fatigue Patient left: in chair;with call bell/phone within reach;with chair alarm set;with family/visitor present     Time:  2111-7356 PT Time Calculation (min) (ACUTE ONLY): 20 min  Charges:  $Therapeutic Exercise: 8-22 mins                    G Codes:      Ellouise Newer Jun 09, 2014, 12:03 PM Camille Bal [place], [place]

## 2014-05-21 NOTE — Progress Notes (Signed)
SUBJECTIVE: The patient is doing well today.  He continues to have significant SOB.  At this time, he denies chest pain  or any new concerns.  Marland Kitchen antiseptic oral rinse  7 mL Mouth Rinse q12n4p  . aspirin EC  325 mg Oral Daily  . chlorhexidine  15 mL Mouth Rinse BID  . docusate sodium  200 mg Oral Daily  . furosemide  40 mg Oral Daily  . ipratropium-albuterol  3 mL Nebulization Q4H  . levothyroxine  75 mcg Oral QAC breakfast  . pantoprazole  40 mg Oral Daily  . piperacillin-tazobactam (ZOSYN)  IV  3.375 g Intravenous 3 times per day  . potassium chloride  40 mEq Oral Once  . senna-docusate  1 tablet Oral Daily  . sertraline  50 mg Oral Daily  . sodium chloride  10 mL Intravenous Q12H  . vancomycin  1,250 mg Intravenous Q24H  . warfarin  5 mg Oral q1800  . Warfarin - Physician Dosing Inpatient   Does not apply q1800   . sodium chloride      OBJECTIVE: Physical Exam: Filed Vitals:   05/21/14 0600 05/21/14 0700 05/21/14 0753 05/21/14 0800  BP: 129/85 125/102  127/84  Pulse: 88 87  88  Temp:   98.3 F (36.8 C)   TempSrc:   Oral   Resp:  22  24  Height:      Weight:      SpO2: 94% 97%  99%    Intake/Output Summary (Last 24 hours) at 05/21/14 0920 Last data filed at 05/21/14 0730  Gross per 24 hour  Intake 1172.5 ml  Output   1400 ml  Net -227.5 ml    Telemetry reveals sinus rhythm with V pacing  GEN- The patient is ill appearing, alert and oriented x 3 today.   Head- normocephalic, atraumatic Eyes-  Sclera clear, conjunctiva pink Ears- hearing intact Oropharynx- clear Neck- supple,   Lungs- bibasilar rales, normal work of breathing Heart- Regular rate and rhythm (paced) GI- soft, NT, ND, + BS Extremities- no clubbing, cyanosis, + dependant edema Skin- no rash or lesion Psych- euthymic mood, full affect Neuro- strength and sensation are intact  LABS: Basic Metabolic Panel:  Recent Labs  05/20/14 0215  05/20/14 1850 05/21/14 0400  NA 140  --   --  141    K 3.0*  < > 3.5 3.6  CL 97  --   --  100  CO2 36*  --   --  35*  GLUCOSE 145*  --   --  116*  BUN 22  --   --  30*  CREATININE 1.84*  --   --  1.85*  CALCIUM 8.8  --   --  9.6  < > = values in this interval not displayed. Liver Function Tests:  Recent Labs  05/19/14 0334 05/21/14 0400  AST 19 15  ALT 28 18  ALKPHOS 87 86  BILITOT 0.6 0.6  PROT 5.8* 6.1  ALBUMIN 2.4* 2.1*   No results for input(s): LIPASE, AMYLASE in the last 72 hours. CBC:  Recent Labs  05/19/14 0334 05/21/14 0400  WBC 18.9* 17.6*  HGB 11.2* 10.6*  HCT 33.7* 32.7*  MCV 93.6 95.1  PLT 210 265    ASSESSMENT AND PLAN:  1. CHB Remains pace dependant.   I would therefore recommend pacemaker implantation at this time.  Risks, benefits, alternatives to pacemaker implantation were discussed in detail with the patient today. The patient understands that the risks  include but are not limited to bleeding, infection, pneumothorax, perforation, tamponade, vascular damage, renal failure, MI, stroke, death,  and lead dislodgement and wishes to proceed. We will therefore schedule the procedure at the next available time.  Based on our schedule, I would anticipate proceeding tomorrow morning  2. SOB Volume overloaded on exam Lasix restarted  3. Hypokalemia Replete  4. afib maintaining sinus rhythm    Thompson Grayer, MD 05/21/2014 9:20 AM

## 2014-05-21 NOTE — Progress Notes (Signed)
ANTIBIOTIC CONSULT NOTE - FOLLOW UP  Pharmacy Consult for Vancomycin + Zosyn Indication: rule out pneumonia  Allergies  Allergen Reactions  . Contrast Media [Iodinated Diagnostic Agents] Hives and Other (See Comments)    Patient started sneezing and coughing, patient broke out in hives, patient needs 13 hour prep if having IV contrast  . Atorvastatin Other (See Comments)    Myalgias  . Fenofibrate Other (See Comments)    MYALGIAS   . Pravastatin Other (See Comments)    Myalgias     Patient Measurements: Height: 5\' 8"  (172.7 cm) Weight: 192 lb 3.9 oz (87.2 kg) IBW/kg (Calculated) : 68.4   Vital Signs: Temp: 98.3 F (36.8 C) (02/29 0753) Temp Source: Oral (02/29 0753) BP: 127/84 mmHg (02/29 0800) Pulse Rate: 88 (02/29 0800) Intake/Output from previous day: 02/28 0701 - 02/29 0700 In: 1047.5 [I.V.:210; IV Piggyback:837.5] Out: 1725 [Urine:1725] Intake/Output from this shift: Total I/O In: 700 [P.O.:600; IV Piggyback:100] Out: 201 [Urine:200; Stool:1]  Labs:  Recent Labs  05/19/14 0334 05/20/14 0215 05/21/14 0400  WBC 18.9*  --  17.6*  HGB 11.2*  --  10.6*  PLT 210  --  265  CREATININE 1.85* 1.84* 1.85*   Estimated Creatinine Clearance: 45 mL/min (by C-G formula based on Cr of 1.85).  Recent Labs  05/21/14 0832  Onset 11.2     Microbiology: No results found for this or any previous visit (from the past 720 hour(s)).  Anti-infectives    Start     Dose/Rate Route Frequency Ordered Stop   05/18/14 1030  vancomycin (VANCOCIN) 1,250 mg in sodium chloride 0.9 % 250 mL IVPB     1,250 mg 166.7 mL/hr over 90 Minutes Intravenous Every 24 hours 05/18/14 0945     05/18/14 1015  piperacillin-tazobactam (ZOSYN) IVPB 3.375 g     3.375 g 12.5 mL/hr over 240 Minutes Intravenous 3 times per day 05/18/14 0945     05/15/14 2215  vancomycin (VANCOCIN) IVPB 1000 mg/200 mL premix     1,000 mg 200 mL/hr over 60 Minutes Intravenous  Once 05/15/14 1506 05/15/14 2327    05/15/14 2100  cefUROXime (ZINACEF) 1.5 g in dextrose 5 % 50 mL IVPB     1.5 g 100 mL/hr over 30 Minutes Intravenous Every 12 hours 05/15/14 1506 05/17/14 0932   05/15/14 0400  vancomycin (VANCOCIN) 1,500 mg in sodium chloride 0.9 % 250 mL IVPB     1,500 mg 125 mL/hr over 120 Minutes Intravenous To Surgery 05/14/14 1258 05/15/14 0830   05/15/14 0400  cefUROXime (ZINACEF) 1.5 g in dextrose 5 % 50 mL IVPB     1.5 g 100 mL/hr over 30 Minutes Intravenous To Surgery 05/14/14 1258 05/15/14 1252   05/15/14 0400  cefUROXime (ZINACEF) 750 mg in dextrose 5 % 50 mL IVPB  Status:  Discontinued     750 mg 100 mL/hr over 30 Minutes Intravenous To Surgery 05/14/14 1258 05/15/14 1506   05/15/14 0400  vancomycin (VANCOCIN) 1,000 mg in sodium chloride 0.9 % 1,000 mL irrigation      Irrigation To Surgery 05/14/14 1258 05/15/14 1006      Assessment: 25 YOM who continues on Vancomycin + Zosyn for empiric HCAP coverage. Renal function remains stable - doses are appropriate for now. Will plan to check a Vancomycin trough at steady state on 2/29.  Vancomycin trough was drawn 30 minutes early and is SUBtherapeutic at 11.2 on vancomycin 1250mg  q24h.  Goal of Therapy:  Vancomycin trough level 15-20 mcg/ml  Proper  antibiotics for infection/cultures adjusted for renal/hepatic function   Plan:  Change vancomycin to 750mg  IV q12h Continue Zosyn 3.375g IV every 8 hours (infused over 4 hours) Will continue to follow renal function, culture results, LOT, and antibiotic de-escalation plans   Andrey Cota. Diona Foley, PharmD Clinical Pharmacist Pager [phone] 05/21/2014 10:22 AM

## 2014-05-21 NOTE — Progress Notes (Signed)
      [address]       [place] [address]             [phone]        CARDIOTHORACIC SURGERY PROGRESS NOTE   R6 Days Post-Op Procedure(s) (LRB): MINIMALLY INVASIVE MITRAL VALVE (MV) REPLACEMENT (Right) MINIMALLY INVASIVE MAZE PROCEDURE (N/A) TRANSESOPHAGEAL ECHOCARDIOGRAM (TEE) (N/A)  Subjective: Feels better and looks better.  No specific complaints.  Objective: Vital signs: BP Readings from Last 1 Encounters:  05/21/14 129/85   Pulse Readings from Last 1 Encounters:  05/21/14 88   Resp Readings from Last 1 Encounters:  05/21/14 19   Temp Readings from Last 1 Encounters:  05/21/14 97.6 F (36.4 C) Axillary    Hemodynamics:    Physical Exam:  Rhythm:   Sinus w/ Vpacing  Breath sounds: Predominantly clear  Heart sounds:  RRR  Incisions:  Clean and dry  Abdomen:  Soft, non-distended, non-tender  Extremities:  Warm, well-perfused    Intake/Output from previous day: 02/28 0701 - 02/29 0700 In: 1047.5 [I.V.:210; IV Piggyback:837.5] Out: 1725 [Urine:1725] Intake/Output this shift:    Lab Results:  CBC: Recent Labs  05/19/14 0334 05/21/14 0400  WBC 18.9* 17.6*  HGB 11.2* 10.6*  HCT 33.7* 32.7*  PLT 210 265    BMET:  Recent Labs  05/20/14 0215  05/20/14 1850 05/21/14 0400  NA 140  --   --  141  K 3.0*  < > 3.5 3.6  CL 97  --   --  100  CO2 36*  --   --  35*  GLUCOSE 145*  --   --  116*  BUN 22  --   --  30*  CREATININE 1.84*  --   --  1.85*  CALCIUM 8.8  --   --  9.6  < > = values in this interval not displayed.   CBG (last 3)  No results for input(s): GLUCAP in the last 72 hours.  ABG    Component Value Date/Time   PHART 7.498* 05/20/2014 0748   PCO2ART 38.5 05/20/2014 0748   PO2ART 67.0* 05/20/2014 0748   HCO3 30.1* 05/20/2014 0748   TCO2 31 05/20/2014 0748   ACIDBASEDEF 6.0* 05/16/2014 0516   O2SAT 95.0 05/20/2014 0748    CXR: Chest tubes out.  Overall improved bilateral patchy airspace  opacity.  Assessment/Plan: S/P Procedure(s) (LRB): MINIMALLY INVASIVE MITRAL VALVE (MV) REPLACEMENT (Right) MINIMALLY INVASIVE MAZE PROCEDURE (N/A) TRANSESOPHAGEAL ECHOCARDIOGRAM (TEE) (N/A)  Overall stable POD6  Respiratory status and oxygenation improved considerably over last 48 hrs  Likely acute exacerbation of chronic diastolic CHF +/- pneumonia +/- acute lung injury Expected post op volume excess, resolved, weight now below baseline Expected post op atelectasis, mild, R>L Post op leukocytosis, trending down OSA w/ chronic hypoxemia, hypoventilation Chronic kidney disease s/p right nephrectomy for renal cell CA, creatinine stable at baseline Remains in complete heart block, pacer-dependent w/ very slow escape rhythm Expected post op acute blood loss anemia, very mild INR up to 1.9 today on coumadin   Wean FiO2 as tolerated  Continue empiric Vanc + Zosyn for possible HCAP  Nebs and pulm toilet  Mobilize   Coumadin for mechanical valve  Needs permanent pacer - ? Today or tomorrow  Keep back-up temporary pacer at bedside and keep in SICU until pacer in place  [facility] H 05/21/2014 7:33 AM

## 2014-05-22 ENCOUNTER — Encounter (HOSPITAL_COMMUNITY)
Admission: RE | Disposition: A | Discharge status: Home / Self Care | Payer: Self-pay | Source: Ambulatory Visit | Attending: Thoracic Surgery (Cardiothoracic Vascular Surgery)

## 2014-05-22 DIAGNOSIS — I442 Atrioventricular block, complete: Secondary | ICD-10-CM

## 2014-05-22 HISTORY — PX: PERMANENT PACEMAKER INSERTION: SHX5480

## 2014-05-22 LAB — CBC
HEMATOCRIT: 31.8 % — AB (ref 39.0–52.0)
HEMOGLOBIN: 10.1 g/dL — AB (ref 13.0–17.0)
MCH: 30.3 pg (ref 26.0–34.0)
MCHC: 31.8 g/dL (ref 30.0–36.0)
MCV: 95.5 fL (ref 78.0–100.0)
Platelets: 296 10*3/uL (ref 150–400)
RBC: 3.33 MIL/uL — ABNORMAL LOW (ref 4.22–5.81)
RDW: 15.8 % — AB (ref 11.5–15.5)
WBC: 15.1 10*3/uL — AB (ref 4.0–10.5)

## 2014-05-22 LAB — BASIC METABOLIC PANEL
ANION GAP: 9 (ref 5–15)
BUN: 28 mg/dL — ABNORMAL HIGH (ref 6–23)
CHLORIDE: 99 mmol/L (ref 96–112)
CO2: 33 mmol/L — ABNORMAL HIGH (ref 19–32)
Calcium: 9.1 mg/dL (ref 8.4–10.5)
Creatinine, Ser: 1.73 mg/dL — ABNORMAL HIGH (ref 0.50–1.35)
GFR calc Af Amer: 47 mL/min — ABNORMAL LOW (ref >90)
GFR calc non Af Amer: 41 mL/min — ABNORMAL LOW (ref >90)
Glucose, Bld: 116 mg/dL — ABNORMAL HIGH (ref 70–99)
POTASSIUM: 3.5 mmol/L (ref 3.5–5.1)
Sodium: 141 mmol/L (ref 135–145)

## 2014-05-22 LAB — PROTIME-INR
INR: 2.62 — ABNORMAL HIGH (ref 0.00–1.49)
Prothrombin Time: 28.3 seconds — ABNORMAL HIGH (ref 11.6–15.2)

## 2014-05-22 SURGERY — PERMANENT PACEMAKER INSERTION
Anesthesia: LOCAL | Laterality: Left

## 2014-05-22 MED ORDER — CHLORHEXIDINE GLUCONATE 4 % EX LIQD
60.0000 mL | Freq: Once | CUTANEOUS | Status: DC
  Filled 2014-05-22: qty 60

## 2014-05-22 MED ORDER — CHLORHEXIDINE GLUCONATE 4 % EX LIQD
60.0000 mL | Freq: Once | CUTANEOUS | Status: AC
Start: 2014-05-22 — End: 2014-05-22
  Administered 2014-05-22: 4 via TOPICAL
  Filled 2014-05-22: qty 60

## 2014-05-22 MED ORDER — SODIUM CHLORIDE 0.9 % IV SOLN
INTRAVENOUS | Status: DC
  Administered 2014-05-22: 12:00:00 via INTRAVENOUS

## 2014-05-22 MED ORDER — METHYLPREDNISOLONE SODIUM SUCC 125 MG IJ SOLR
125.0000 mg | INTRAMUSCULAR | Status: AC
  Administered 2014-05-22: 125 mg via INTRAVENOUS
  Filled 2014-05-22: qty 2

## 2014-05-22 MED ORDER — ONDANSETRON HCL 4 MG/2ML IJ SOLN: 4.0000 mg | Freq: Four times a day (QID) | INTRAMUSCULAR | Status: DC | PRN

## 2014-05-22 MED ORDER — SODIUM CHLORIDE 0.9 % IR SOLN
80.0000 mg | Status: AC
  Administered 2014-05-22: 80 mg
  Filled 2014-05-22: qty 2

## 2014-05-22 MED ORDER — POTASSIUM CHLORIDE 10 MEQ/50ML IV SOLN
10.0000 meq | INTRAVENOUS | Status: AC | PRN
  Administered 2014-05-22 (×3): 10 meq via INTRAVENOUS
  Filled 2014-05-22 (×3): qty 50

## 2014-05-22 MED ORDER — HEPARIN (PORCINE) IN NACL 2-0.9 UNIT/ML-% IJ SOLN
INTRAMUSCULAR | Status: AC
  Filled 2014-05-22: qty 500

## 2014-05-22 MED ORDER — MIDAZOLAM HCL 5 MG/5ML IJ SOLN
INTRAMUSCULAR | Status: AC
  Filled 2014-05-22: qty 5

## 2014-05-22 MED ORDER — HYDROCODONE-ACETAMINOPHEN 5-325 MG PO TABS: 1.0000 | ORAL_TABLET | ORAL | Status: DC | PRN

## 2014-05-22 MED ORDER — LIDOCAINE HCL (PF) 1 % IJ SOLN
INTRAMUSCULAR | Status: AC
  Filled 2014-05-22: qty 30

## 2014-05-22 MED ORDER — POTASSIUM CHLORIDE 10 MEQ/50ML IV SOLN
INTRAVENOUS | Status: AC
  Administered 2014-05-22: 10 meq via INTRAVENOUS
  Filled 2014-05-22: qty 150

## 2014-05-22 MED ORDER — SODIUM CHLORIDE 0.9 % IV SOLN: 250.0000 mL | INTRAVENOUS | Status: DC | PRN

## 2014-05-22 MED ORDER — POTASSIUM CHLORIDE 10 MEQ/50ML IV SOLN
10.0000 meq | INTRAVENOUS | Status: AC
Start: 2014-05-22 — End: 2014-05-22
  Administered 2014-05-22 (×3): 10 meq via INTRAVENOUS
  Filled 2014-05-22 (×4): qty 50

## 2014-05-22 MED ORDER — SODIUM CHLORIDE 0.9 % IJ SOLN: 3.0000 mL | Freq: Two times a day (BID) | INTRAMUSCULAR | Status: DC

## 2014-05-22 MED ORDER — ACETAMINOPHEN 325 MG PO TABS: 325.0000 mg | ORAL_TABLET | ORAL | Status: DC | PRN

## 2014-05-22 MED ORDER — FAMOTIDINE IN NACL 20-0.9 MG/50ML-% IV SOLN
20.0000 mg | INTRAVENOUS | Status: AC
  Administered 2014-05-22: 20 mg via INTRAVENOUS
  Filled 2014-05-22: qty 50

## 2014-05-22 MED ORDER — SODIUM CHLORIDE 0.9 % IJ SOLN: 3.0000 mL | INTRAMUSCULAR | Status: DC | PRN

## 2014-05-22 MED ORDER — FENTANYL CITRATE 0.05 MG/ML IJ SOLN
INTRAMUSCULAR | Status: AC
  Filled 2014-05-22: qty 2

## 2014-05-22 MED ORDER — DIPHENHYDRAMINE HCL 50 MG/ML IJ SOLN
25.0000 mg | INTRAMUSCULAR | Status: AC
  Administered 2014-05-22: 25 mg via INTRAVENOUS
  Filled 2014-05-22: qty 1

## 2014-05-22 NOTE — H&P (View-Only) (Signed)
Subjective:  Up in chair, looks weak, on face mask O2.  Objective:  Vital Signs in the last 24 hours: Temp:  [97.9 F (36.6 C)-98.6 F (37 C)] 98.1 F (36.7 C) (03/01 0809) Pulse Rate:  [75-95] 80 (03/01 0700) Resp:  [17-33] 17 (03/01 0700) BP: (99-143)/(62-97) 118/76 mmHg (03/01 0700) SpO2:  [91 %-100 %] 100 % (03/01 0826) FiO2 (%):  [50 %] 50 % (02/29 2302) Weight:  [187 lb 13.3 oz (85.2 kg)] 187 lb 13.3 oz (85.2 kg) (03/01 0600)  Intake/Output from previous day:  Intake/Output Summary (Last 24 hours) at 05/22/14 0959 Last data filed at 05/22/14 0745  Gross per 24 hour  Intake  872.5 ml  Output    825 ml  Net   47.5 ml    Physical Exam: General appearance: alert, cooperative and mild distress Lungs: anterior rales, decreased posterior breath sounds Heart: regular rate and rhythm   Rate: 80  Rhythm: paced  Lab Results:  Recent Labs  05/21/14 0400 05/22/14 0525  WBC 17.6* 15.1*  HGB 10.6* 10.1*  PLT 265 296    Recent Labs  05/21/14 0400 05/22/14 0525  NA 141 141  K 3.6 3.5  CL 100 99  CO2 35* 33*  GLUCOSE 116* 116*  BUN 30* 28*  CREATININE 1.85* 1.73*   No results for input(s): TROPONINI in the last 72 hours.  Invalid input(s): CK, MB  Recent Labs  05/22/14 0525  INR 2.62*    Imaging: Dg Chest Port 1 View  05/21/2014   CLINICAL DATA:  Pneumonia.  Follow-up radiographs.  EXAM: PORTABLE CHEST - 1 VIEW  COMPARISON:  05/20/2014.  FINDINGS: Support apparatus: The RIGHT thoracostomy tube has been removed. RIGHT upper extremity PICC remains present. Monitoring leads project over the chest. Epicardial pacing leads remain present.  Cardiomediastinal Silhouette: Mildly enlarged with heart size accentuated by rotation and suboptimal inspiration. Prostatic mitral valve is present. LEFT atrial clip.  Lungs: Perihilar atelectasis on the RIGHT. Diffuse hazy opacity is present throughout the LEFT lung which appears similar to the prior exam. No pneumothorax.   Effusions:  None.  Other:  None.  IMPRESSION: 1. Interval removal of RIGHT thoracostomy tube.  No pneumothorax. 2. Unchanged RIGHT upper extremity PICC. 3. Lower lung volumes than on prior with slightly increased atelectasis. 4. Postsurgical changes of mitral valve replacement and LEFT atrial clip.   Electronically Signed   By: Dereck Ligas M.D.   On: 05/21/2014 07:57    Cardiac Studies:  Assessment/Plan:   Principal Problem:   S/P minimally invasive mitral valve replacement with metallic valve and maze procedure Active Problems:   Acute on chronic diastolic CHF (congestive heart failure), NYHA class 3   Paroxysmal atrial fibrillation   CHB (complete heart block)   Mitral stenosis   CHF (congestive heart failure)   Chronic anticoagulation   CKD (chronic kidney disease), stage II   HTN (hypertension)   Obesity   Sleep apnea   Rheumatic heart disease   CAD (coronary artery disease) - moderate, nonobstructive by cath 08/2013   Renal cell carcinoma of right kidney-nephrectomy June 2015   PLAN: permanent pacemaker today-pt has had IV contrast reaction the past with hives, he will need pre Pacemaker contrast prophylaxis with steroids, pepcid, and benadryl.  K+m supplement ordered.   Kerin Ransom PA-C Beeper [phone] 05/22/2014, 9:59 AM   I have seen, examined the patient, and reviewed the above assessment and plan.  Changes to above are made where necessary.  Risks, benefits, alternatives to pacemaker implantation were discussed in detail with the patient today. The patient understands that the risks include but are not limited to bleeding, infection, pneumothorax, perforation, tamponade, vascular damage, renal failure, MI, stroke, death,  and lead dislodgement and wishes to proceed. We will anticipate the procedure to occur later today.  Co Sign: Thompson Grayer, MD 05/22/2014 10:54 AM

## 2014-05-22 NOTE — Progress Notes (Signed)
RT note- patient transported to 2s on NRB, awake and oriented.

## 2014-05-22 NOTE — Progress Notes (Signed)
CT surgery p.m. Rounds  Patient just returned from cath lab for permanent pacemaker Epicardial pacing wires now at VVI backup We'll hold Coumadin dose tonight and plan removal of epicardial pacing wires tomorrow Portable chest x-ray in a.m. Otherwise patient appears to be doing well

## 2014-05-22 NOTE — Progress Notes (Signed)
Subjective:  Up in chair, looks weak, on face mask O2.  Objective:  Vital Signs in the last 24 hours: Temp:  [97.9 F (36.6 C)-98.6 F (37 C)] 98.1 F (36.7 C) (03/01 0809) Pulse Rate:  [75-95] 80 (03/01 0700) Resp:  [17-33] 17 (03/01 0700) BP: (99-143)/(62-97) 118/76 mmHg (03/01 0700) SpO2:  [91 %-100 %] 100 % (03/01 0826) FiO2 (%):  [50 %] 50 % (02/29 2302) Weight:  [187 lb 13.3 oz (85.2 kg)] 187 lb 13.3 oz (85.2 kg) (03/01 0600)  Intake/Output from previous day:  Intake/Output Summary (Last 24 hours) at 05/22/14 0959 Last data filed at 05/22/14 0745  Gross per 24 hour  Intake  872.5 ml  Output    825 ml  Net   47.5 ml    Physical Exam: General appearance: alert, cooperative and mild distress Lungs: anterior rales, decreased posterior breath sounds Heart: regular rate and rhythm   Rate: 80  Rhythm: paced  Lab Results:  Recent Labs  05/21/14 0400 05/22/14 0525  WBC 17.6* 15.1*  HGB 10.6* 10.1*  PLT 265 296    Recent Labs  05/21/14 0400 05/22/14 0525  NA 141 141  K 3.6 3.5  CL 100 99  CO2 35* 33*  GLUCOSE 116* 116*  BUN 30* 28*  CREATININE 1.85* 1.73*   No results for input(s): TROPONINI in the last 72 hours.  Invalid input(s): CK, MB  Recent Labs  05/22/14 0525  INR 2.62*    Imaging: Dg Chest Port 1 View  05/21/2014   CLINICAL DATA:  Pneumonia.  Follow-up radiographs.  EXAM: PORTABLE CHEST - 1 VIEW  COMPARISON:  05/20/2014.  FINDINGS: Support apparatus: The RIGHT thoracostomy tube has been removed. RIGHT upper extremity PICC remains present. Monitoring leads project over the chest. Epicardial pacing leads remain present.  Cardiomediastinal Silhouette: Mildly enlarged with heart size accentuated by rotation and suboptimal inspiration. Prostatic mitral valve is present. LEFT atrial clip.  Lungs: Perihilar atelectasis on the RIGHT. Diffuse hazy opacity is present throughout the LEFT lung which appears similar to the prior exam. No pneumothorax.   Effusions:  None.  Other:  None.  IMPRESSION: 1. Interval removal of RIGHT thoracostomy tube.  No pneumothorax. 2. Unchanged RIGHT upper extremity PICC. 3. Lower lung volumes than on prior with slightly increased atelectasis. 4. Postsurgical changes of mitral valve replacement and LEFT atrial clip.   Electronically Signed   By: Dereck Ligas M.D.   On: 05/21/2014 07:57    Cardiac Studies:  Assessment/Plan:   Principal Problem:   S/P minimally invasive mitral valve replacement with metallic valve and maze procedure Active Problems:   Acute on chronic diastolic CHF (congestive heart failure), NYHA class 3   Paroxysmal atrial fibrillation   CHB (complete heart block)   Mitral stenosis   CHF (congestive heart failure)   Chronic anticoagulation   CKD (chronic kidney disease), stage II   HTN (hypertension)   Obesity   Sleep apnea   Rheumatic heart disease   CAD (coronary artery disease) - moderate, nonobstructive by cath 08/2013   Renal cell carcinoma of right kidney-nephrectomy June 2015   PLAN: permanent pacemaker today-pt has had IV contrast reaction the past with hives, he will need pre Pacemaker contrast prophylaxis with steroids, pepcid, and benadryl.  K+m supplement ordered.   Kerin Ransom PA-C Beeper [phone] 05/22/2014, 9:59 AM   I have seen, examined the patient, and reviewed the above assessment and plan.  Changes to above are made where necessary.  Risks, benefits, alternatives to pacemaker implantation were discussed in detail with the patient today. The patient understands that the risks include but are not limited to bleeding, infection, pneumothorax, perforation, tamponade, vascular damage, renal failure, MI, stroke, death,  and lead dislodgement and wishes to proceed. We will anticipate the procedure to occur later today.  Co Sign: Thompson Grayer, MD 05/22/2014 10:54 AM

## 2014-05-22 NOTE — Op Note (Signed)
SURGEON:  Thompson Grayer, MD     PREPROCEDURE DIAGNOSIS:  Symptomatic complete heart block    POSTPROCEDURE DIAGNOSIS:  Symptomatic complete heart block     PROCEDURES:   1. Pacemaker implantation.     INTRODUCTION: Kevin English is a 59 y.o. male  with a history of complete heart block who presents today for pacemaker implantation.  The patient is s/p mitral valve replacement and MAZE.  He has persistent AV block post operatively.  No reversible causes have been identified.  The patient therefore presents today for pacemaker implantation.     DESCRIPTION OF PROCEDURE:  Informed written consent was obtained, and the patient was brought to the electrophysiology lab in a fasting state.  The patient had significant respiratory failure and baseline and required BiPAP for the procedure today (he was already on BiPAP in 2S).   The patient received IV Versed and Fentanyl as sedation for the procedure today.  The patients left chest was prepped and draped in the usual sterile fashion by the EP lab staff. The skin overlying the left deltopectoral region was infiltrated with lidocaine for local analgesia.  A 4-cm incision was made over the left deltopectoral region.  A left subcutaneous pacemaker pocket was fashioned using a combination of sharp and blunt dissection. Electrocautery was required to assure hemostasis.    RA/RV Lead Placement: The left axillary vein was therefore cannulated.  No contrast was required for this endeavor.  Through the left axillary vein, a Medtronic model [id number] (serial number [id number]) right atrial lead and a Medtronic model 5076- [id number] (serial number [id number]) right ventricular lead were advanced with fluoroscopic visualization into the right atrial appendage and right ventricular apex positions respectively.  Initial atrial lead P- waves measured 2.5 mV with impedance of 489 ohms and a threshold of 1.5 V at 0.5 msec.  Right ventricular lead R-waves measured 10 mV with an  impedance of 684 ohms and a threshold of 1.1 V at 0.5 msec.  Both leads   were secured to the pectoralis fascia using #2-0 silk over the suture sleeves.   Device Placement:  The leads were then connected to a Medtronic Adapta L model ADDRL 1 (serial number [id number]) pacemaker.  The pocket was irrigated with copious gentamicin solution.  The pacemaker was then placed into the pocket.  The pocket was then closed in 2 layers with 2.0 Vicryl suture for the subcutaneous and subcuticular layers.  Steri- Strips and a sterile dressing were then applied. EBL<8ml. There were no early apparent complications.  No contrast was given during this procedure.    CONCLUSIONS:   1. Successful implantation of a Medtronic Adapta L dual-chamber pacemaker for symptomatic complete heart block  2. No early apparent complications.         Permanent Pacemaker Indication: Documented non-reversible symptomatic bradycardia due to second degree and/or third degree atrioventricular block.      Thompson Grayer, MD 05/22/2014 3:49 PM

## 2014-05-22 NOTE — Progress Notes (Signed)
      [address]       [place] [address]             [phone]        CARDIOTHORACIC SURGERY PROGRESS NOTE   R7 Days Post-Op Procedure(s) (LRB): MINIMALLY INVASIVE MITRAL VALVE (MV) REPLACEMENT (Right) MINIMALLY INVASIVE MAZE PROCEDURE (N/A) TRANSESOPHAGEAL ECHOCARDIOGRAM (TEE) (N/A)  Subjective: Looks and feels better.  Reports breathing better.  Had a better night.  Objective: Vital signs: BP Readings from Last 1 Encounters:  05/22/14 118/76   Pulse Readings from Last 1 Encounters:  05/22/14 80   Resp Readings from Last 1 Encounters:  05/22/14 17   Temp Readings from Last 1 Encounters:  05/22/14 98.1 F (36.7 C) Axillary    Hemodynamics:    Physical Exam:  Rhythm:   Sinus w/ 3rd degree AV block, Vpacing  Breath sounds: clear  Heart sounds:  RRR  Incisions:  Dressings dry, intact  Abdomen:  Soft, non-distended, non-tender  Extremities:  Warm, well-perfused    Intake/Output from previous day: 02/29 0701 - 03/01 0700 In: 1522.5 [P.O.:960; IV Piggyback:562.5] Out: 1026 [Urine:1025; Stool:1] Intake/Output this shift: Total I/O In: 50 [IV Piggyback:50] Out: -   Lab Results:  CBC: Recent Labs  05/21/14 0400 05/22/14 0525  WBC 17.6* 15.1*  HGB 10.6* 10.1*  HCT 32.7* 31.8*  PLT 265 296    BMET:  Recent Labs  05/21/14 0400 05/22/14 0525  NA 141 141  K 3.6 3.5  CL 100 99  CO2 35* 33*  GLUCOSE 116* 116*  BUN 30* 28*  CREATININE 1.85* 1.73*  CALCIUM 9.6 9.1     CBG (last 3)  No results for input(s): GLUCAP in the last 72 hours.  ABG    Component Value Date/Time   PHART 7.498* 05/20/2014 0748   PCO2ART 38.5 05/20/2014 0748   PO2ART 67.0* 05/20/2014 0748   HCO3 30.1* 05/20/2014 0748   TCO2 31 05/20/2014 0748   ACIDBASEDEF 6.0* 05/16/2014 0516   O2SAT 95.0 05/20/2014 0748    CXR: n/a  Assessment/Plan: S/P Procedure(s) (LRB): MINIMALLY INVASIVE MITRAL VALVE (MV) REPLACEMENT (Right) MINIMALLY INVASIVE MAZE  PROCEDURE (N/A) TRANSESOPHAGEAL ECHOCARDIOGRAM (TEE) (N/A)  Overall stable POD7  Respiratory status and oxygenation continues to improve, although still w/ significant O2 requirement Likely acute exacerbation of chronic diastolic CHF +/- pneumonia +/- acute lung injury Expected post op volume excess, resolved, weight now below baseline Expected post op atelectasis, mild, R>L Post op leukocytosis, trending down OSA w/ chronic hypoxemia, hypoventilation Chronic kidney disease s/p right nephrectomy for renal cell CA, creatinine stable at baseline Remains in complete heart block, pacer-dependent w/ very slow escape rhythm Expected post op acute blood loss anemia, very mild INR up to 2.6 today on coumadin   Wean FiO2 as tolerated  Continue empiric Vanc + Zosyn for possible HCAP  Nebs and pulm toilet  Mobilize   Supplement K+  Coumadin for mechanical valve  Needs permanent pacer   Keep back-up temporary pacer at bedside and keep in SICU until pacer in place  [facility] H 05/22/2014 8:27 AM

## 2014-05-22 NOTE — Interval H&P Note (Signed)
History and Physical Interval Note:  05/22/2014 10:55 AM  Kevin English  has presented today for surgery, with the diagnosis of SICK SINUS  The various methods of treatment have been discussed with the patient and family. After consideration of risks, benefits and other options for treatment, the patient has consented to  Procedure(s): PERMANENT PACEMAKER INSERTION (Left) as a surgical intervention .  The patient's history has been reviewed, patient examined, no change in status, stable for surgery.  I have reviewed the patient's chart and labs.  Questions were answered to the patient's satisfaction.     Thompson Grayer

## 2014-05-23 ENCOUNTER — Inpatient Hospital Stay (HOSPITAL_COMMUNITY): Payer: Managed Care, Other (non HMO)

## 2014-05-23 ENCOUNTER — Encounter (HOSPITAL_COMMUNITY): Payer: Self-pay | Admitting: Internal Medicine

## 2014-05-23 DIAGNOSIS — Z0271 Encounter for disability determination: Secondary | ICD-10-CM

## 2014-05-23 LAB — GLUCOSE, CAPILLARY: Glucose-Capillary: 130 mg/dL — ABNORMAL HIGH (ref 70–99)

## 2014-05-23 LAB — BASIC METABOLIC PANEL
Anion gap: 8 (ref 5–15)
BUN: 32 mg/dL — ABNORMAL HIGH (ref 6–23)
CHLORIDE: 105 mmol/L (ref 96–112)
CO2: 27 mmol/L (ref 19–32)
Calcium: 9.2 mg/dL (ref 8.4–10.5)
Creatinine, Ser: 1.55 mg/dL — ABNORMAL HIGH (ref 0.50–1.35)
GFR calc Af Amer: 54 mL/min — ABNORMAL LOW (ref >90)
GFR, EST NON AFRICAN AMERICAN: 47 mL/min — AB (ref >90)
Glucose, Bld: 161 mg/dL — ABNORMAL HIGH (ref 70–99)
Potassium: 4.3 mmol/L (ref 3.5–5.1)
SODIUM: 140 mmol/L (ref 135–145)

## 2014-05-23 LAB — CBC
HCT: 30.6 % — ABNORMAL LOW (ref 39.0–52.0)
HEMOGLOBIN: 9.7 g/dL — AB (ref 13.0–17.0)
MCH: 30.3 pg (ref 26.0–34.0)
MCHC: 31.7 g/dL (ref 30.0–36.0)
MCV: 95.6 fL (ref 78.0–100.0)
Platelets: 315 10*3/uL (ref 150–400)
RBC: 3.2 MIL/uL — ABNORMAL LOW (ref 4.22–5.81)
RDW: 15.5 % (ref 11.5–15.5)
WBC: 16.8 10*3/uL — ABNORMAL HIGH (ref 4.0–10.5)

## 2014-05-23 LAB — VANCOMYCIN, TROUGH: Vancomycin Tr: 15.7 ug/mL (ref 10.0–20.0)

## 2014-05-23 LAB — PROTIME-INR
INR: 2.93 — ABNORMAL HIGH (ref 0.00–1.49)
PROTHROMBIN TIME: 30.8 s — AB (ref 11.6–15.2)

## 2014-05-23 MED ORDER — FUROSEMIDE 40 MG PO TABS
40.0000 mg | ORAL_TABLET | Freq: Every day | ORAL | Status: DC
  Administered 2014-05-23: 40 mg via ORAL
  Filled 2014-05-23 (×3): qty 1

## 2014-05-23 MED ORDER — METOPROLOL TARTRATE 12.5 MG HALF TABLET
12.5000 mg | ORAL_TABLET | Freq: Two times a day (BID) | ORAL | Status: DC
  Administered 2014-05-23 (×2): 12.5 mg via ORAL
  Filled 2014-05-23 (×4): qty 1

## 2014-05-23 MED ORDER — POTASSIUM CHLORIDE CRYS ER 20 MEQ PO TBCR
20.0000 meq | EXTENDED_RELEASE_TABLET | Freq: Every day | ORAL | Status: DC
  Administered 2014-05-23: 20 meq via ORAL
  Filled 2014-05-23 (×3): qty 1

## 2014-05-23 NOTE — Progress Notes (Signed)
   SUBJECTIVE: The patient is doing well today.  Denies CP.  SOB seems a little better  . antiseptic oral rinse  7 mL Mouth Rinse q12n4p  . aspirin EC  325 mg Oral Daily  . chlorhexidine  15 mL Mouth Rinse BID  . docusate sodium  200 mg Oral Daily  . ipratropium-albuterol  3 mL Nebulization QID  . levothyroxine  75 mcg Oral QAC breakfast  . pantoprazole  40 mg Oral Daily  . piperacillin-tazobactam (ZOSYN)  IV  3.375 g Intravenous 3 times per day  . senna-docusate  1 tablet Oral Daily  . sertraline  50 mg Oral Daily  . vancomycin  750 mg Intravenous Q12H  . warfarin  5 mg Oral q1800  . Warfarin - Physician Dosing Inpatient   Does not apply q1800      OBJECTIVE: Physical Exam: Filed Vitals:   05/23/14 0600 05/23/14 0700 05/23/14 0800 05/23/14 0822  BP: 142/70 153/81 146/78   Pulse: 76 82 77   Temp:   97.9 F (36.6 C)   TempSrc:   Axillary   Resp: 15 19 16    Height:      Weight:      SpO2: 95% 97% 100% 100%    Intake/Output Summary (Last 24 hours) at 05/23/14 0925 Last data filed at 05/23/14 0800  Gross per 24 hour  Intake   1270 ml  Output   1575 ml  Net   -305 ml    Telemetry reveals sinus rhythm with pacing  GEN- The patient is better appearing, alert and oriented x 3 today.   Head- normocephalic, atraumatic Eyes-  Sclera clear, conjunctiva pink Ears- hearing intact Oropharynx- clear Neck- supple  Lungs- basilar rales, normal work of breathing Heart- Regular rate and rhythm mechanical S1 GI- soft, NT, ND, + BS Extremities- no clubbing, cyanosis, + dependant edema Skin- no rash or lesion Psych- euthymic mood, full affect Neuro- strength and sensation are intact  LABS: Basic Metabolic Panel:  Recent Labs  05/22/14 0525 05/23/14 0415  NA 141 140  K 3.5 4.3  CL 99 105  CO2 33* 27  GLUCOSE 116* 161*  BUN 28* 32*  CREATININE 1.73* 1.55*  CALCIUM 9.1 9.2   Liver Function Tests:  Recent Labs  05/21/14 0400  AST 15  ALT 18  ALKPHOS 86  BILITOT  0.6  PROT 6.1  ALBUMIN 2.1*   No results for input(s): LIPASE, AMYLASE in the last 72 hours. CBC:  Recent Labs  05/22/14 0525 05/23/14 0415  WBC 15.1* 16.8*  HGB 10.1* 9.7*  HCT 31.8* 30.6*  MCV 95.5 95.6  PLT 296 315    ASSESSMENT AND PLAN:  1. Complete heart block Doing well s/p PPM cxr reveals no ptx, stable leads Device interrogation is reviewed and normal Ok to remove epicardial wires from EP standpoint  2. Renal failure improving No contrast required for PPM yesterday  3. S/p MVR/ maze On coumadin   Routine device follow-up and wound care Please remove PICC as soon as able to reduce infectious risks I will keep pressure dressing on another 24 hours due to elevated INR though pocket looks good.  I will remove personally in next 1-2 days  Electrophysiology team to see as needed while here. Please call with questions.    Thompson Grayer, MD 05/23/2014 9:25 AM

## 2014-05-23 NOTE — Progress Notes (Signed)
[facility] BRIEF SICU PROGRESS NOTE  1 Day Post-Op  S/P Procedure(s) (LRB): PERMANENT PACEMAKER INSERTION (Left)   Stable day Oxygenation improving  Plan: Continue current plan  Leeona Mccardle H 05/23/2014 6:49 PM

## 2014-05-23 NOTE — Progress Notes (Signed)
      [address]       [place] [address]             [phone]     CARDIOTHORACIC SURGERY PROGRESS NOTE  R8 Days Post-Op Procedure(s) (LRB): MINIMALLY INVASIVE MITRAL VALVE (MV) REPLACEMENT (Right) MINIMALLY INVASIVE MAZE PROCEDURE (N/A) TRANSESOPHAGEAL ECHOCARDIOGRAM (TEE) (N/A)  1 Day Post-Op  S/P Procedure(s) (LRB): PERMANENT PACEMAKER INSERTION (Left)  Subjective: Looks and feels better.  Breathing slowly improving.  Objective: Vital signs in last 24 hours: Temp:  [97.9 F (36.6 C)-98.7 F (37.1 C)] 97.9 F (36.6 C) (03/02 0800) Pulse Rate:  [76-94] 77 (03/02 0800) Cardiac Rhythm:  [-] Ventricular paced (03/02 0800) Resp:  [14-35] 16 (03/02 0800) BP: (108-153)/(66-85) 146/78 mmHg (03/02 0800) SpO2:  [95 %-100 %] 100 % (03/02 0822) Weight:  [87.2 kg (192 lb 3.9 oz)] 87.2 kg (192 lb 3.9 oz) (03/02 0446)  Physical Exam:  Rhythm:   Sinus w/ V-pacing  Breath sounds: Fairly clear  Heart sounds:  RRR  Incisions:  Clean and dry  Abdomen:  Soft, non-distended, non-tender  Extremities:  Warm, well-perfused    Intake/Output from previous day: 03/01 0701 - 03/02 0700 In: 1572.5 [P.O.:600; I.V.:260; IV Piggyback:712.5] Out: 1900 [Urine:1900] Intake/Output this shift: Total I/O In: 12.5 [IV Piggyback:12.5] Out: -   Lab Results:  Recent Labs  05/22/14 0525 05/23/14 0415  WBC 15.1* 16.8*  HGB 10.1* 9.7*  HCT 31.8* 30.6*  PLT 296 315   BMET:  Recent Labs  05/22/14 0525 05/23/14 0415  NA 141 140  K 3.5 4.3  CL 99 105  CO2 33* 27  GLUCOSE 116* 161*  BUN 28* 32*  CREATININE 1.73* 1.55*  CALCIUM 9.1 9.2    CBG (last 3)   Recent Labs  05/23/14 0807  GLUCAP 130*   PT/INR:   Recent Labs  05/23/14 0415  LABPROT 30.8*  INR 2.93*    CXR:  PORTABLE CHEST - 1 VIEW  COMPARISON: Portable chest x-ray of 05/21/2014  FINDINGS: Aeration has improved, particularly with decrease in haziness overlying the right mid lung and  right perihilar region. No focal infiltrate or effusion is seen. Mild cardiomegaly is stable. Mitral valve replacement and left atrial appendage exclusion device remain. Pacemaker wires are now present. Right central venous line tip overlies the lower SVC.  IMPRESSION: 1. Improvement in right mid lung opacity most consistent with improving atelectasis. 2. Permanent pacemaker now present. 3. Right PICC line tip overlies the lower SVC.   Electronically Signed  By: Ivar Drape M.D.  On: 05/23/2014 08:00  Assessment/Plan:  Overall stable POD8  Respiratory status and oxygenation continues to improve, although still w/ significant O2 requirement Likely acute exacerbation of chronic diastolic CHF +/- pneumonia +/- acute lung injury Expected post op volume excess, resolved, weight now below baseline Expected post op atelectasis, mild, R>L Post op leukocytosis, stable OSA w/ chronic hypoxemia, hypoventilation Chronic kidney disease s/p right nephrectomy for renal cell CA, creatinine stable at baseline Expected post op acute blood loss anemia, very mild INR up to 2.9 today on coumadin   Wean FiO2 as tolerated  Continue empiric Vanc + Zosyn for possible HCAP  Nebs and pulm toilet  Mobilize   Hold Coumadin today and d/c temporary pacing wires once INR drifts down  Transfer step down once oxygenations improves a bit more   OWEN,CLARENCE H 05/23/2014 9:54 AM

## 2014-05-23 NOTE — Progress Notes (Signed)
ANTIBIOTIC CONSULT NOTE - FOLLOW UP  Pharmacy Consult for Vancomycin + Zosyn Indication: rule out pneumonia  Allergies  Allergen Reactions  . Contrast Media [Iodinated Diagnostic Agents] Hives and Other (See Comments)    Patient started sneezing and coughing, patient broke out in hives, patient needs 13 hour prep if having IV contrast  . Atorvastatin Other (See Comments)    Myalgias  . Fenofibrate Other (See Comments)    MYALGIAS   . Pravastatin Other (See Comments)    Myalgias     Patient Measurements: Height: 5\' 8"  (172.7 cm) Weight: 192 lb 3.9 oz (87.2 kg) IBW/kg (Calculated) : 68.4   Vital Signs: Temp: 98.3 F (36.8 C) (03/02 1100) Temp Source: Oral (03/02 1100) BP: 146/78 mmHg (03/02 0800) Pulse Rate: 77 (03/02 0800) Intake/Output from previous day: 03/01 0701 - 03/02 0700 In: 1572.5 [P.O.:600; I.V.:260; IV Piggyback:712.5] Out: 1900 [Urine:1900] Intake/Output from this shift: Total I/O In: 12.5 [IV Piggyback:12.5] Out: -   Labs:  Recent Labs  05/21/14 0400 05/22/14 0525 05/23/14 0415  WBC 17.6* 15.1* 16.8*  HGB 10.6* 10.1* 9.7*  PLT 265 296 315  CREATININE 1.85* 1.73* 1.55*   Estimated Creatinine Clearance: 53.7 mL/min (by C-G formula based on Cr of 1.55).  Recent Labs  05/21/14 0832 05/23/14 1045  VANCOTROUGH 11.2 15.7     Microbiology: No results found for this or any previous visit (from the past 720 hour(s)).  Anti-infectives    Start     Dose/Rate Route Frequency Ordered Stop   05/22/14 1100  gentamicin (GARAMYCIN) 80 mg in sodium chloride irrigation 0.9 % 500 mL irrigation     80 mg Irrigation On call 05/22/14 1046 05/22/14 1336   05/21/14 1400  gentamicin (GARAMYCIN) 80 mg in sodium chloride irrigation 0.9 % 500 mL irrigation  Status:  Discontinued     80 mg Irrigation On call 05/21/14 1353 05/22/14 1048   05/21/14 1400  ceFAZolin (ANCEF) IVPB 2 g/50 mL premix     2 g 100 mL/hr over 30 Minutes Intravenous On call 05/21/14 1353  05/22/14 1411   05/21/14 1100  vancomycin (VANCOCIN) IVPB 750 mg/150 ml premix     750 mg 150 mL/hr over 60 Minutes Intravenous Every 12 hours 05/21/14 1029     05/18/14 1030  vancomycin (VANCOCIN) 1,250 mg in sodium chloride 0.9 % 250 mL IVPB  Status:  Discontinued     1,250 mg 166.7 mL/hr over 90 Minutes Intravenous Every 24 hours 05/18/14 0945 05/21/14 1029   05/18/14 1015  piperacillin-tazobactam (ZOSYN) IVPB 3.375 g     3.375 g 12.5 mL/hr over 240 Minutes Intravenous 3 times per day 05/18/14 0945     05/15/14 2215  vancomycin (VANCOCIN) IVPB 1000 mg/200 mL premix     1,000 mg 200 mL/hr over 60 Minutes Intravenous  Once 05/15/14 1506 05/15/14 2327   05/15/14 2100  cefUROXime (ZINACEF) 1.5 g in dextrose 5 % 50 mL IVPB     1.5 g 100 mL/hr over 30 Minutes Intravenous Every 12 hours 05/15/14 1506 05/17/14 0932   05/15/14 0400  vancomycin (VANCOCIN) 1,500 mg in sodium chloride 0.9 % 250 mL IVPB     1,500 mg 125 mL/hr over 120 Minutes Intravenous To Surgery 05/14/14 1258 05/15/14 0830   05/15/14 0400  cefUROXime (ZINACEF) 1.5 g in dextrose 5 % 50 mL IVPB     1.5 g 100 mL/hr over 30 Minutes Intravenous To Surgery 05/14/14 1258 05/15/14 1252   05/15/14 0400  cefUROXime (ZINACEF) 750 mg  in dextrose 5 % 50 mL IVPB  Status:  Discontinued     750 mg 100 mL/hr over 30 Minutes Intravenous To Surgery 05/14/14 1258 05/15/14 1506   05/15/14 0400  vancomycin (VANCOCIN) 1,000 mg in sodium chloride 0.9 % 1,000 mL irrigation      Irrigation To Surgery 05/14/14 1258 05/15/14 1006      Assessment: 58 YOM who continues on Vancomycin + Zosyn for empiric HCAP coverage. Renal function remains stable - doses are appropriate for now. Will plan to check a Vancomycin trough at steady state on 2/29.  Vancomycin trough is 15.59mcg/ml. Continue current regimen.  Goal of Therapy:  Vancomycin trough level 15-20 mcg/ml  Proper antibiotics for infection/cultures adjusted for renal/hepatic function   Plan:   Continue vancomycin to 750mg  IV q12h Continue Zosyn 3.375g IV every 8 hours (infused over 4 hours) Will continue to follow renal function, culture results, LOT, and antibiotic de-escalation plans   Isac Sarna, BS Pharm D, BCPS Clinical Pharmacist  05/23/2014 12:03 PM

## 2014-05-23 NOTE — Progress Notes (Signed)
Physical Therapy Treatment Patient Details Name: Kevin English MRN: [id number] DOB: June 18, 1955 Today's Date: 05/23/2014    History of Present Illness Pt s/p mini MVR with hx of CHF, AFib, OSA periods of dizziness over the last few years. Pt with home CPAP use. s/p permanent pacemaker placement 3/01.    PT Comments    Progressing towards physical therapy goals. Ambulates very short distance today without LE buckling. Limited by fatigue, but progressed standing balance to 4.5 minutes today and is tolerated exercises well. Patient will continue to benefit from skilled physical therapy services to further improve independence with functional mobility.   Follow Up Recommendations  CIR     Equipment Recommendations  Rolling walker with 5" wheels;3in1 (PT)    Recommendations for Other Services OT consult     Precautions / Restrictions Precautions Precautions: Fall;ICD/Pacemaker Precaution Comments: watch sats, pacer dependent Restrictions Other Position/Activity Restrictions: Pacemaker precautions    Mobility  Bed Mobility Overal bed mobility: Needs Assistance Bed Mobility: Supine to Sit     Supine to sit: Min assist     General bed mobility comments: Min assist for truncal support with cues for technique and to maintain pacemaker precautions with LUE.  Transfers Overall transfer level: Needs assistance Equipment used: Rolling walker (2 wheeled) Transfers: Sit to/from Stand Sit to Stand: Min assist         General transfer comment: Min assist for balance, leaning posteriorly upon standing. Able to correct after standing for a short period of time. Cues for hand placement on Rt to sit and stand safely.  Ambulation/Gait Ambulation/Gait assistance: Min assist;+2 safety/equipment Ambulation Distance (Feet): 4 Feet Assistive device: Rolling walker (2 wheeled) Gait Pattern/deviations: Step-through pattern;Decreased stride length;Narrow base of support;Trunk flexed Gait  velocity: slow   General Gait Details: Able to take several steps forward towards chair today without any buckling noted. Min assist for walker control. +2 assist to manage equipment.   Stairs            Wheelchair Mobility    Modified Rankin (Stroke Patients Only)       Balance             Standing balance-Leahy Scale: Poor Standing balance comment: Tolerated 4.5 minutes of standing today. SpO2 maintains 98% and greater on 14L with non-rebreather mask. Reports feeling dizzy                    Cognition Arousal/Alertness: Awake/alert Behavior During Therapy: Anxious Overall Cognitive Status: Impaired/Different from baseline Area of Impairment: Problem solving;Memory     Memory: Decreased short-term memory       Problem Solving: Slow processing;Requires verbal cues      Exercises General Exercises - Lower Extremity Ankle Circles/Pumps: AROM;Both;10 reps;Seated Quad Sets: Strengthening;Both;10 reps;Seated Long Arc Quad: Strengthening;Both;10 reps;Seated Hip Flexion/Marching: Strengthening;Both;10 reps;Seated    General Comments        Pertinent Vitals/Pain Pain Assessment: No/denies pain  HR 81 SpO2 on non-rebreather mask at 14L - 99% BP 140/78    Home Living                      Prior Function            PT Goals (current goals can now be found in the care plan section) Acute Rehab PT Goals PT Goal Formulation: With patient Time For Goal Achievement: 05/31/14 Potential to Achieve Goals: Good Progress towards PT goals: Progressing toward goals    Frequency  Min 3X/week  PT Plan Current plan remains appropriate    Co-evaluation             End of Session Equipment Utilized During Treatment: Gait belt;Oxygen Activity Tolerance: Patient limited by fatigue Patient left: in chair;with call bell/phone within reach     Time: 0921-0945 PT Time Calculation (min) (ACUTE ONLY): 24 min  Charges:  $Therapeutic  Exercise: 8-22 mins $Therapeutic Activity: 8-22 mins                    G Codes:      Ellouise Newer 06/01/14, 10:25 AM Elayne Snare, [place]

## 2014-05-24 LAB — PROTIME-INR
INR: 2.3 — AB (ref 0.00–1.49)
Prothrombin Time: 25.5 seconds — ABNORMAL HIGH (ref 11.6–15.2)

## 2014-05-24 MED ORDER — METOPROLOL TARTRATE 25 MG PO TABS
25.0000 mg | ORAL_TABLET | Freq: Two times a day (BID) | ORAL | Status: DC
  Administered 2014-05-24 – 2014-05-26 (×5): 25 mg via ORAL
  Filled 2014-05-24 (×7): qty 1

## 2014-05-24 MED ORDER — WARFARIN SODIUM 5 MG PO TABS
5.0000 mg | ORAL_TABLET | Freq: Every day | ORAL | Status: DC
  Administered 2014-05-24 – 2014-05-25 (×2): 5 mg via ORAL
  Filled 2014-05-24 (×3): qty 1

## 2014-05-24 MED ORDER — IPRATROPIUM-ALBUTEROL 0.5-2.5 (3) MG/3ML IN SOLN: 3.0000 mL | RESPIRATORY_TRACT | Status: DC | PRN

## 2014-05-24 NOTE — Progress Notes (Signed)
Epicardial wires pulled, ends intact; pt tolerated well; VSS will continue to monitor.

## 2014-05-24 NOTE — Progress Notes (Signed)
Physical Therapy Treatment Patient Details Name: Kevin English MRN: [id number] DOB: 02-Oct-1955 Today's Date: 05/24/2014    History of Present Illness Pt s/p mini MVR with hx of CHF, AFib, OSA periods of dizziness over the last few years. Pt with home CPAP use. s/p permanent pacemaker placement 3/01.    PT Comments    Pt with excellent progression from last time this therapist saw pt.Unaware of precautions with education and reinforcement throughout session as well as sling application end of session. Pt very pleasant and calm throughout willing to progress mobility. Pt with total assist for pericare. Pt encouraged to continue HEP and mobility with nursing including walking multiple times a day. Pt with sats 100% on 2L at rest and required 4L with gait due to SOB. Will continue to follow.   Follow Up Recommendations  CIR     Equipment Recommendations       Recommendations for Other Services       Precautions / Restrictions Precautions Precautions: Fall;ICD/Pacemaker    Mobility  Bed Mobility Overal bed mobility: Needs Assistance Bed Mobility: Rolling;Sidelying to Sit Rolling: Min assist Sidelying to sit: Min assist       General bed mobility comments: cues for sequence, precautions and safety  Transfers Overall transfer level: Needs assistance   Transfers: Sit to/from Stand;Stand Pivot Transfers Sit to Stand: Min guard Stand pivot transfers: Min guard       General transfer comment: cues for hand placement, precautions with use of RW for pivot bed to [facility]   Ambulation/Gait Ambulation/Gait assistance: Min guard Ambulation Distance (Feet): 100 Feet Assistive device: Rolling walker (2 wheeled) Gait Pattern/deviations: Step-through pattern;Decreased stride length;Trunk flexed     General Gait Details: cues for posture and position in RW, no buckling and pt able to self-regulate activity today on 4L for gait   Stairs            Wheelchair Mobility     Modified Rankin (Stroke Patients Only)       Balance Overall balance assessment: Needs assistance   Sitting balance-Leahy Scale: Good       Standing balance-Leahy Scale: Fair                      Cognition Arousal/Alertness: Awake/alert Behavior During Therapy: WFL for tasks assessed/performed Overall Cognitive Status: Within Functional Limits for tasks assessed                      Exercises General Exercises - Lower Extremity Long Arc Quad: Strengthening;Both;Seated;15 reps;AROM Hip Flexion/Marching: Strengthening;Both;Seated;AROM;15 reps    General Comments        Pertinent Vitals/Pain Pain Assessment: No/denies pain    Home Living                      Prior Function            PT Goals (current goals can now be found in the care plan section) Progress towards PT goals: Progressing toward goals    Frequency       PT Plan Current plan remains appropriate    Co-evaluation             End of Session Equipment Utilized During Treatment: Oxygen Activity Tolerance: Patient tolerated treatment well Patient left: in chair;with call bell/phone within reach     Time: 0750-0818 PT Time Calculation (min) (ACUTE ONLY): 28 min  Charges:  $Gait Training: 8-22 mins $Therapeutic Exercise: 8-22 mins  G CodesLanetta Inch Beth 06/08/14, 10:12 AM Elwyn Reach, [place]

## 2014-05-24 NOTE — Progress Notes (Signed)
      [address]       [place] [address]             [phone]     CARDIOTHORACIC SURGERY PROGRESS NOTE  R9 Days Post-Op Procedure(s) (LRB): MINIMALLY INVASIVE MITRAL VALVE (MV) REPLACEMENT (Right) MINIMALLY INVASIVE MAZE PROCEDURE (N/A) TRANSESOPHAGEAL ECHOCARDIOGRAM (TEE) (N/A)  2 Days Post-Op  S/P Procedure(s) (LRB): PERMANENT PACEMAKER INSERTION (Left)  Subjective: Feels better.  Had a good night.  Breathing continues to improve.  Complains about having diarrhea but reportedly not watery diarrhea - not felt to be suspicious for C diff per nursing staff  Objective: Vital signs in last 24 hours: Temp:  [97.9 F (36.6 C)-98.4 F (36.9 C)] 98.4 F (36.9 C) (03/02 2352) Pulse Rate:  [77-90] 81 (03/03 0700) Cardiac Rhythm:  [-] Ventricular paced (03/02 2000) Resp:  [15-24] 15 (03/03 0700) BP: (115-180)/(66-117) 144/79 mmHg (03/03 0700) SpO2:  [92 %-100 %] 92 % (03/03 0700) Weight:  [85 kg (187 lb 6.3 oz)] 85 kg (187 lb 6.3 oz) (03/03 0500)  Physical Exam:  Rhythm:   Sinus w/ V pacing  Breath sounds: clear  Heart sounds:  RRR  Incisions:  Clean and dry  Abdomen:  Soft, non-distended, non-tender  Extremities:  Warm, well-perfused    Intake/Output from previous day: 03/02 0701 - 03/03 0700 In: 1182.5 [P.O.:720; IV Piggyback:462.5] Out: 2225 [Urine:2225] Intake/Output this shift:    Lab Results:  Recent Labs  05/22/14 0525 05/23/14 0415  WBC 15.1* 16.8*  HGB 10.1* 9.7*  HCT 31.8* 30.6*  PLT 296 315   BMET:  Recent Labs  05/22/14 0525 05/23/14 0415  NA 141 140  K 3.5 4.3  CL 99 105  CO2 33* 27  GLUCOSE 116* 161*  BUN 28* 32*  CREATININE 1.73* 1.55*  CALCIUM 9.1 9.2    CBG (last 3)   Recent Labs  05/23/14 0807  GLUCAP 130*   PT/INR:   Recent Labs  05/24/14 0430  LABPROT 25.5*  INR 2.30*    CXR:  N/A  Assessment/Plan:  Overall stable POD9  Respiratory status and oxygenation continues to improve Likely acute  exacerbation of chronic diastolic CHF +/- pneumonia +/- acute lung injury Expected post op volume excess, resolved, weight now below baseline Expected post op atelectasis, mild, R>L Post op leukocytosis, stable OSA w/ chronic hypoxemia, hypoventilation - home CPAP Chronic kidney disease s/p right nephrectomy for renal cell CA, creatinine stable at baseline Expected post op acute blood loss anemia, very mild INR down to 2.3 today with coumadin held last 2 days Chronic depression   Wean O2 as tolerated  Complete 7 day course empiric Vanc + Zosyn for possible HCAP  (today is day 6)  Nebs and pulm toilet  Mobilize   D/C temporary pacing wires today and resume coumadin  Transfer step down    Valora Norell H 05/24/2014 7:43 AM

## 2014-05-24 NOTE — Progress Notes (Signed)
Report called to Jeneen Rinks on 2W.  Pt transferred to 2W24.  All VS stable upon transfer.  Wife advised of transfer.  Pt's home CPAP was transferred with him.

## 2014-05-25 ENCOUNTER — Encounter (HOSPITAL_COMMUNITY): Payer: Self-pay | Admitting: Surgical

## 2014-05-25 ENCOUNTER — Inpatient Hospital Stay (HOSPITAL_COMMUNITY): Payer: Managed Care, Other (non HMO)

## 2014-05-25 LAB — BASIC METABOLIC PANEL
ANION GAP: 6 (ref 5–15)
BUN: 26 mg/dL — ABNORMAL HIGH (ref 6–23)
CALCIUM: 9 mg/dL (ref 8.4–10.5)
CHLORIDE: 107 mmol/L (ref 96–112)
CO2: 24 mmol/L (ref 19–32)
Creatinine, Ser: 1.78 mg/dL — ABNORMAL HIGH (ref 0.50–1.35)
GFR calc Af Amer: 46 mL/min — ABNORMAL LOW (ref >90)
GFR calc non Af Amer: 39 mL/min — ABNORMAL LOW (ref >90)
Glucose, Bld: 133 mg/dL — ABNORMAL HIGH (ref 70–99)
POTASSIUM: 4.1 mmol/L (ref 3.5–5.1)
Sodium: 137 mmol/L (ref 135–145)

## 2014-05-25 LAB — CBC
HCT: 34.2 % — ABNORMAL LOW (ref 39.0–52.0)
Hemoglobin: 11.1 g/dL — ABNORMAL LOW (ref 13.0–17.0)
MCH: 30.4 pg (ref 26.0–34.0)
MCHC: 32.5 g/dL (ref 30.0–36.0)
MCV: 93.7 fL (ref 78.0–100.0)
PLATELETS: 481 10*3/uL — AB (ref 150–400)
RBC: 3.65 MIL/uL — AB (ref 4.22–5.81)
RDW: 14.8 % (ref 11.5–15.5)
WBC: 24 10*3/uL — ABNORMAL HIGH (ref 4.0–10.5)

## 2014-05-25 LAB — CLOSTRIDIUM DIFFICILE BY PCR: CDIFFPCR: NEGATIVE

## 2014-05-25 LAB — PROTIME-INR
INR: 1.8 — AB (ref 0.00–1.49)
Prothrombin Time: 21 seconds — ABNORMAL HIGH (ref 11.6–15.2)

## 2014-05-25 NOTE — Discharge Summary (Signed)
Physician Discharge Summary  Patient ID: Kevin English MRN: [id number] DOB/AGE: Jul 14, 1955 59 y.o.  Admit date: 05/15/2014 Discharge date: 05/26/2014  Admission Diagnoses: Severe mitral stenosis and recurrent paroxysmal atrial fibrillation  Discharge Diagnoses:  Principal Problem:   S/P minimally invasive mitral valve replacement with metallic valve and maze procedure Active Problems:   HTN (hypertension)   Obesity   Sleep apnea   Rheumatic heart disease   Mitral stenosis   Acute on chronic diastolic CHF (congestive heart failure), NYHA class 3   CHF (congestive heart failure)   Chronic anticoagulation   Paroxysmal atrial fibrillation   CAD (coronary artery disease) - moderate, nonobstructive by cath 08/2013   CKD (chronic kidney disease), stage II   Renal cell carcinoma of right kidney-nephrectomy June 2015   CHB (complete heart block)  Status post dual-chamber pacemaker  Patient Active Problem List   Diagnosis Date Noted  . CHB (complete heart block) 05/22/2014  . S/P minimally invasive mitral valve replacement with metallic valve and maze procedure 05/15/2014  . S/P Minimally invasive maze operation for atrial fibrillation 05/15/2014  . Yeast dermatitis 04/19/2014  . Atrial fibrillation   . Lung nodule seen on imaging study 04/10/2014  . Renal neoplasm 09/05/2013  . Renal cell carcinoma of right kidney-nephrectomy June 2015 09/05/2013  . CAD (coronary artery disease) - moderate, nonobstructive by cath 08/2013   . CKD (chronic kidney disease), stage II   . Long term (current) use of anticoagulants 08/21/2013  . Encounter for therapeutic drug monitoring 08/21/2013  . Atrial fibrillation with RVR 08/18/2013  . Chronic anticoagulation 08/11/2013  . Paroxysmal atrial fibrillation 08/11/2013  . Hypothyroid 08/11/2013  . Fatigue 08/11/2013  . Dyspnea on exertion 08/11/2013  . Acute on chronic diastolic CHF (congestive heart failure), NYHA class 3 08/09/2013  . CHF (congestive  heart failure) 08/09/2013  . Rheumatic heart disease 08/02/2013  . Mitral stenosis 08/02/2013  . Other abnormal glucose   . Benign neoplasm of colon   . Hernia   . Chronic rhinitis   . Diverticulitis of colon   . Hyperlipidemia   . HTN (hypertension)   . Hypogonadism male   . Leukocytosis   . Myalgia and myositis, unspecified   . Obesity   . Sleep apnea   . Vitamin D deficiency    Discharged Condition: good  History of present illness: Patient is a 59 year old white male from [place] with recently discovered rheumatic mitral stenosis associated with recurrent paroxysmal atrial fibrillation and acute exacerbation of chronic diastolic congestive heart failure. The patient states that he first began to experience severe chronic fatigue with exertional shortness of breath and intermittent dizzy spells more than 4 years ago. He underwent an echocardiogram and was evaluated by a cardiologist in [place] at that time. He was told that there did not appear to be anything wrong. Symptoms progressed and the patient states he was forced to quit his job because of severe fatigue. Since then he has continued to experience severe chronic fatigue. He was diagnosed with obstructive sleep apnea and uses CPAP at night. This has not substantially improved his symptoms. He was referred to Dr. [name] for cardiology consultation in September of 2014. His primary complaint at that time remained severe fatigue and exhaustion. He was started on Synthroid for hypothyroidism. Symptoms persisted, and more recently he began to experience tachypalpitations associated with dizzy spells and severe fatigue. He was seen in followup by Dr [name] last spring and an echocardiogram and 30 day monitor were obtained. The  patient's event monitor documented recurrent episodes of atrial fibrillation with rapid ventricular rate. Transthoracic echocardiogram revealed likely rheumatic heart disease with severe mitral stenosis and  normal left ventricular systolic function. The patient was hospitalized last May with an acute exacerbation of symptoms of shortness of breath and palpitations, at which time he was found to be in atrial flutter with 2:1 block. I had the opportunity to see him in consultation at that time. He subsequently converted back to sinus rhythm on amiodarone. He was seen in followup on 08/24/2013 and plans were made to proceed with minimally invasive mitral valve repair or replacement and Maze procedure later that month. However, CT angiogram performed 08/25/2013 revealed a mass in the right kidney suspicious for renal cell carcinoma. Because the patient remained in sinus rhythm and very stable from a cardiac standpoint, plans for cardiac surgery were postponed. The patient subsequently underwent robotic-assisted right radical nephrectomy on 09/05/2013 by Dr. Rolan Bucco. Final pathology was consistent with clear cell renal cell carcinoma, Fuhrman grade 2-3, with maximum tumor diameter 2.7 cm, all tumor to find confined to the kidney, and all surgical margins negative (T1aN0). His postoperative recovery was uncomplicated, and his serum creatinine level has stabilized at 2.0. Postoperatively the patient did not experience any problems with recurrent paroxysmal atrial fibrillation or congestive heart failure, and he has been followed in the office on several occasions by Dr. [name]. He returns with hope to proceed with mitral valve repair or replacement and Maze procedure. He states that over the last few months he has continued to feel better as time progresses. He still gets short of breath and tired quite easily with any physical exertion. However, his energy level has improved and he feels as though he is finally recovered completely from his nephrectomy. He has not had any palpitations no other signs to suggest a recurrence of atrial fibrillation. The remainder of his review of systems is unremarkable.  The  patient was recently scheduled for minimally invasive mitral valve replacement and Maze procedure on 04/19/2014. However, when the patient presented for surgery he was found to have a new severe rash in both groins related to cutaneous yeast infection. Surgery was postponed and the patient was given a prescription for topical antifungal ointment. He returns to the office today for follow-up. The patient states that the rash resolved quickly with the topical cream and he otherwise feels well. He was admitted this hospitalization for the procedure.  Hospital Course: The patient was admitted and on 05/15/2014 taken to the operating room at which time he underwent the following procedure:  CARDIOTHORACIC SURGERY OPERATIVE NOTE  Date of Procedure:05/15/2014  Preoperative Diagnosis:   Severe Mitral Stenosis  Recurrent Paroxysmal Atrial Fibrillation  Postoperative Diagnosis:Same  Procedure:   Minimally-Invasive Mitral Valve Replacement Sorin [employer] Optiform bileaflet mechanical valve (size 64mm, catalog #[id number], serial #[id number])   Maze Procedure Left atrial lesion set using cryothermy Clipping of Left Atrial Appendage (Atricure left atrial clip, size 45 mm)  Surgeon:Clarence H. Roxy Manns, MD  Assistant:Veryl Abril, PA-C  Anesthesia:E. Annye Asa, MD  Operative Findings:  Rheumatic mitral valve disease with severe mitral stenosis  Mild aortic insufficiency  Normal left ventricular systolic function  The patient tolerated the procedure well. The patient was reintubated using a single lumen endotracheal tube and subsequently transported to the surgical intensive care unit in stable condition. There were no intraoperative complications. All sponge instrument and needle counts are verified correct at completion of the operation.  Consults: Electrophysiology  Postoperative  hospital  course:  Overall the patient has progressed well. He has remained neurologically intact. He has remained hemodynamically stable and early inotropic support was weaned without difficulty. He did have a postoperative complete heart block requiring external pacemaker for several days. This was observed closely and on postoperative day #4 EP consultation was obtained. He was continuously observed however remained pacemaker dependent and it was determined that he would require permanent pacemaker placement. This was done on 05/22/2014.   From a pulmonary point patient initially did well being extubated without difficulty. However he did develop a presumed HCAP requiring  antibiotics for which she has received a 7 day course of intravenous vancomycin and Zosyn.. This has clinically  improved over time as has leukocytosis. . This is also likely exacerbation of chronic diastolic CHF with possible acute lung injury. He also has obstructive sleep apnea requiring CPAP which he uses at home.   He  also had postoperative volume overload which is steadily improved with diuretics. Weight is now at baseline. He has chronic kidney disease and creatinine is fairly stable at 1.78.  He has an expected acute blood loss anemia which is stable and mild.  He has been started on Coumadin for his mechanical valve.  Currently he is felt to be progressing well.  He has been weaned off oxygen. He is ambulating independently and is felt to be medically stable for discharge on 05/26/2014.   Disposition: 01-Home or Self Care   Discharge Medications:   The patient has been discharged on:   1.Beta Blocker:  Yes [ x  ]                              No   [   ]                              If No, reason:  2.Ace Inhibitor/ARB: Yes [   ]                                     No  [  x  ]                                     If No, reason: Elevated creatinine  3.Statin:   Yes [   ]                  No  [ x  ]                   If No, reason: Intolerance  4.S[place]a [place]:  Yes  [ x  ]                  No   [   ]                  If No, reason:    Medication List    STOP taking these medications        amiodarone 200 MG tablet  Commonly known as:  PACERONE     amLODipine 10 MG tablet  Commonly known as:  NORVASC      TAKE these medications        acetaminophen 325 MG tablet  Commonly known as:  TYLENOL  Take 325 mg by mouth every 6 (six) hours as needed (pain).     ALPRAZolam 0.5 MG tablet  Commonly known as:  XANAX  Take 0.5-1 mg by mouth at bedtime as needed for anxiety.     HYDROcodone-acetaminophen 5-325 MG per tablet  Commonly known as:  NORCO/VICODIN  Take 1-2 tablets by mouth every 4 (four) hours as needed for moderate pain.     levothyroxine 75 MCG tablet  Commonly known as:  SYNTHROID  Take 1 tablet (75 mcg total) by mouth daily before breakfast.     metoprolol tartrate 25 MG tablet  Commonly known as:  LOPRESSOR  Take 1 tablet (25 mg total) by mouth 2 (two) times daily.     nystatin-triamcinolone cream  Commonly known as:  MYCOLOG II  Apply 1 application topically 2 (two) times daily.     nystatin-triamcinolone cream  Commonly known as:  MYCOLOG II  APPLY TO AFFECTED AREA TWICE A DAY     sertraline 50 MG tablet  Commonly known as:  ZOLOFT  Take 1 tablet (50 mg total) by mouth daily.     traMADol 50 MG tablet  Commonly known as:  ULTRAM  Take 1-2 tablets (50-100 mg total) by mouth every 4 (four) hours as needed for moderate pain.     warfarin 5 MG tablet  Commonly known as:  COUMADIN  Take 2.5-5 mg by mouth daily. Sun Tues Thurs 2.5 mg. , and Mon Wed Fri and Sat 5 mg             Follow-up Information    Follow up with [facility] [employer] Office On 06/04/2014.   Specialty:  Cardiology   Why:  at 11 AM for wound check with device clinic   Contact information:   [address] [place] [phone]      Follow up with  Thompson Grayer, MD On 08/22/2014.   Specialty:  Cardiology   Why:  at 10:45AM for pacemaker check   Contact information:   [place] [address] [phone]       Follow up with Rexene Alberts, MD.   Specialty:  Cardiothoracic Surgery   Why:  06/11/2014 at 1:30 PM to see the  PA. Please obtain a chest x-ray at [facility] at 1 PM. [facility] imaging is located in the same office complex.    Contact information:   [place] [address] [phone]       Follow up with Richardson Dopp, PA-C On 06/13/2014.   Specialty:  Physician Assistant   Why:  Appointment is at 11:30   Contact information:   [address] [place] [address] [phone]       Follow up with [facility] Office On 05/30/2014.   Specialty:  Cardiology   Why:  PT/INR at 8:45   Contact information:   [address], [place] [phone]      Signed: Ellwood Handler 05/26/2014, 9:38 AM

## 2014-05-25 NOTE — Progress Notes (Signed)
CARDIAC REHAB PHASE I   PRE:  Rate/Rhythm: 78 Pacing  BP:  Supine:   Sitting: 120/66  Standing:    SaO2: 92 RA  MODE:  Ambulation: 150 ft   POST:  Rate/Rhythm: 86 Pacing  BP:  Supine:   Sitting: 88/73  Standing:    SaO2: 93 RA 1140-1240 On arrival pt in bed on CPAP, pt states that he just had a bowel movement in bed. Cleaned pt up. Room air sat 92-93%. Assisted X 1 and used walker to ambulate. Gait steady with walker. He does tire walking and c/o of feeling weak. Pt  To recliner after walk with call light in reach. BP after walk 88/73. I questioned if he felt dizzy walking, he said he felt weak. Rechecked BP after he was in recliner 104/78. Pt states that he is not eating or drinking much because it makes him use he urinal more and have more bowel movements. I encouraged him to drink more. Placed BSC right beside of his recliner.  Rodney Langton RN 05/25/2014 12:43 PM

## 2014-05-25 NOTE — Progress Notes (Signed)
RT went to check with patient about CPAP. Patient had already administered home CPAP.

## 2014-05-25 NOTE — Progress Notes (Signed)
Stool sent for c-diff per PA orders which came back negative. Contact precautions discontinued at this time. RN will continue to monitor.

## 2014-05-25 NOTE — Progress Notes (Addendum)
[address]       [place] [address]             [phone]          3 Days Post-Op Procedure(s) (LRB): PERMANENT PACEMAKER INSERTION (Left)  Subjective: Continues to have loose stools. Otherwise, feels well.  Still with productive cough, but less sputum production. Appetite ok.   Objective: Vital signs in last 24 hours: Patient Vitals for the past 24 hrs:  BP Temp Temp src Pulse Resp SpO2 Weight  05/25/14 0448 123/86 mmHg 98.1 F (36.7 C) Oral 73 (!) 22 95 % 186 lb 6.4 oz (84.55 kg)  05/24/14 2208 136/75 mmHg - - 91 - - -  05/24/14 2002 125/72 mmHg 98.6 F (37 C) Oral 80 17 93 % -  05/24/14 1641 116/72 mmHg 98.7 F (37.1 C) Oral 75 18 97 % -  05/24/14 1620 115/81 mmHg - - (!) 156 (!) 21 90 % -  05/24/14 1500 - - - 76 18 100 % -  05/24/14 1400 - - - 79 (!) 25 100 % -  05/24/14 1300 120/80 mmHg - - 78 16 100 % -  05/24/14 1227 - 98.6 F (37 C) Oral - - - -  05/24/14 1215 134/76 mmHg - - 84 20 100 % -  05/24/14 1200 127/74 mmHg - - 84 20 99 % -  05/24/14 1145 131/78 mmHg - - 83 16 100 % -  05/24/14 1130 127/77 mmHg - - 84 16 100 % -  05/24/14 1115 126/77 mmHg - - 84 16 100 % -  05/24/14 1100 127/76 mmHg - - 85 18 100 % -  05/24/14 1000 (!) 153/82 mmHg - - 87 20 100 % -   Current Weight  05/25/14 186 lb 6.4 oz (84.55 kg)  BASELINE WEIGHT:92 kg   Intake/Output from previous day: 03/03 0701 - 03/04 0700 In: 930 [P.O.:480; IV Piggyback:450] Out: 1575 [Urine:1575]    PHYSICAL EXAM:  Heart: RRR, paced Lungs: Clear Wound: All incisions and CT sites clean and dry, no erythema or drainage Abdomen: Soft, NT/ND, +BS Extremities: No significant LE edema    Lab Results: CBC: Recent Labs  05/23/14 0415 05/25/14 0507  WBC 16.8* 24.0*  HGB 9.7* 11.1*  HCT 30.6* 34.2*  PLT 315 481*   BMET:  Recent Labs  05/23/14 0415 05/25/14 0507  NA 140 137  K 4.3 4.1  CL 105 107  CO2 27 24  GLUCOSE 161* 133*  BUN 32* 26*  CREATININE  1.55* 1.78*  CALCIUM 9.2 9.0    PT/INR:  Recent Labs  05/25/14 0507  LABPROT 21.0*  INR 1.80*      Assessment/Plan: S/P Procedure(s) (LRB): PERMANENT PACEMAKER INSERTION (Left)  CV- stable s/p PPM.  BPs controlled, continue beta blocker, Coumadin.  HAP/Leukocytosis- WBC continues to trend up. Presently on Vanc/Zosyn with symptomatic improvement of pulm symptoms. Incisions look good. He did receive a dose of Solumedrol prior to PPM placement, so this could ostensibly account for the increase. Only other issue is diarrhea, which is concerning for C diff given his prolonged abx therapy and the prevalence in the hospital. Will send stool sample, follow labs.  CKD- Cr up slightly this am, will continue to monitor.   Continue pulm toilet, CRPI/PT.   LOS: 10 days    COLLINS,GINA H 05/25/2014  I have seen and examined the patient and agree with the assessment and plan as outlined.  Overall continues  to improve and reports feeling much better. CXR appearance continues to improve. However, still having loose stool and WBC elevated.  Will check C-diff and have low threshold to start oral Flagyl or Vancomycin.  Continue to hold diuretics as weight below baseline and he dose not look fluid overloaded.    Toi Stelly H 05/25/2014

## 2014-05-26 LAB — BASIC METABOLIC PANEL
ANION GAP: 7 (ref 5–15)
BUN: 20 mg/dL (ref 6–23)
CO2: 24 mmol/L (ref 19–32)
CREATININE: 1.56 mg/dL — AB (ref 0.50–1.35)
Calcium: 9 mg/dL (ref 8.4–10.5)
Chloride: 105 mmol/L (ref 96–112)
GFR calc non Af Amer: 46 mL/min — ABNORMAL LOW (ref >90)
GFR, EST AFRICAN AMERICAN: 54 mL/min — AB (ref >90)
Glucose, Bld: 111 mg/dL — ABNORMAL HIGH (ref 70–99)
Potassium: 3.8 mmol/L (ref 3.5–5.1)
SODIUM: 136 mmol/L (ref 135–145)

## 2014-05-26 LAB — CBC
HCT: 41.2 % (ref 39.0–52.0)
HEMOGLOBIN: 13.5 g/dL (ref 13.0–17.0)
MCH: 31 pg (ref 26.0–34.0)
MCHC: 32.8 g/dL (ref 30.0–36.0)
MCV: 94.5 fL (ref 78.0–100.0)
Platelets: 392 10*3/uL (ref 150–400)
RBC: 4.36 MIL/uL (ref 4.22–5.81)
RDW: 14.6 % (ref 11.5–15.5)
WBC: 15.9 10*3/uL — ABNORMAL HIGH (ref 4.0–10.5)

## 2014-05-26 LAB — PROTIME-INR
INR: 1.68 — AB (ref 0.00–1.49)
Prothrombin Time: 19.9 seconds — ABNORMAL HIGH (ref 11.6–15.2)

## 2014-05-26 MED ORDER — TRAMADOL HCL 50 MG PO TABS: 50.0000 mg | ORAL_TABLET | ORAL | Status: DC | PRN

## 2014-05-26 MED ORDER — ASPIRIN EC 81 MG PO TBEC: 81.0000 mg | DELAYED_RELEASE_TABLET | Freq: Every day | ORAL | Status: DC

## 2014-05-26 MED ORDER — SODIUM CHLORIDE 0.9 % IJ SOLN
10.0000 mL | INTRAMUSCULAR | Status: DC | PRN
  Administered 2014-05-26: 10 mL
  Filled 2014-05-26: qty 40

## 2014-05-26 MED ORDER — HYDROCODONE-ACETAMINOPHEN 5-325 MG PO TABS: 1.0000 | ORAL_TABLET | ORAL | Status: DC | PRN

## 2014-05-26 MED ORDER — METOPROLOL TARTRATE 25 MG PO TABS: 25.0000 mg | ORAL_TABLET | Freq: Two times a day (BID) | ORAL | Status: DC

## 2014-05-26 NOTE — Progress Notes (Signed)
Pt discharged home Discharge instructions given & reviewed Education discussed per CR and RN PICC dc'd per IV team nurse Tele dc'd  Pt discharged via wheelchair with NT All patient belongs at side.   Sherrie Mustache 12:27 PM

## 2014-05-26 NOTE — Progress Notes (Signed)
[id number] Cardiac Rehab Completed discharge education with pt and wife. They voice understanding. Py agrees to [facility]. CRP in [place], will send referral.I discussed smoking cessation with pt. He has been using nicotine lozenges prior to coming to the hospital to slow down his smoking. He plans to quit using it. I gave him tips for quitting, coaching contact number and information on quit smart classes. Deon Pilling, RN 05/26/2014 12:01 PM

## 2014-05-26 NOTE — Discharge Instructions (Signed)
Constipation 1. May use stool softners or Miralax daily until resolved.  If diarrhea develops do not use   Mitral Valve Replacement, Care After Refer to this sheet in the next few weeks. These instructions provide you with information on caring for yourself after your procedure. Your health care provider may also give you specific instructions. Your treatment has been planned according to current medical practices, but problems sometimes occur. Call your health care provider if you have any problems or questions after your procedure.  HOME CARE INSTRUCTIONS   Take medicines only as directed by your health care provider.  Take your temperature every morning for the first 7 days after surgery. Write these down.  Weigh yourself every morning for at least 7 days after surgery. Write your weight down.  Wear elastic stockings during the day for at least 2 weeks after surgery or as directed by your health care provider. Use them longer if your ankles are swollen. The stockings help blood flow and help reduce swelling in the legs.  Take frequent naps or rest often throughout the day.  Avoid lifting more than 10 lb (4.5 kg) or pushing or pulling things with your arms for 6-8 weeks or as directed by your health care provider.  Avoid driving or airplane travel for 3 weeks after surgery or as directed. If you are riding in a car for an extended period, stop every 1-2 hours to stretch your legs. Avoid crossing your legs.  Minimaze Procedure, Care After Refer to this sheet in the next few weeks. These instructions provide you with information on caring for yourself after your procedure. Your health care provider may also give you more specific instructions. Your treatment has been planned according to current medical practices, but problems sometimes occur. Call your health care provider if you have any problems or questions after your procedure.  WHAT TO EXPECT AFTER THE PROCEDURE After your procedure,  it is typical to have continued symptoms of an irregular heartbeat for 3-6 months.  HOME CARE INSTRUCTIONS  Take medicines only as directed by your health care provider.  Do not drive or operate heavy machinery while taking pain medicine. Do not travel by airplane for at least 2 weeks after the chest tube is removed or as directed by your health care provider.  Avoid activities that use your chest muscles, such as lifting heavy objects, for at least 8 weeks or as directed by your health care provider.  You may resume a normal diet and activities as directed by your health care provider.  Take deep breaths to expand the lungs and to protect against pneumonia.  Do breathing exercises as directed by your health care provider. If you were given an incentive spirometer to help with breathing, use it as directed.  Do not take baths, swim, or use a hot tub until your health care provider approves. Use the shower instead.  Keep the bandage (dressing) covering the area where the chest tube was inserted dry for 48 hours.  Remove dressings as directed.  Change dressings if necessary or as directed.  Until the cuts (incisions) heal, women should wear a soft bra or place gauze pads over the incisions when wearing a wire bra.  Stop smoking if you smoke. Smoking slows the healing process. If you need help quitting, ask your health care provider.  Keep all follow-up visits as directed by your health care provider. SEEK MEDICAL CARE IF:  You notice drainage, redness, swelling, or pain at your incision site.  You have a bad smell coming from an incision or dressing.  Your heart feels like it is fluttering or beating rapidly. You have chills. You have a fever. SEEK IMMEDIATE MEDICAL CARE IF:  You have a rash.  You have shortness of breath.  You have difficulty breathing.  You have chest pain that is not from one of the incisions.  You feel weak, light-headed, dizzy, or faint. Document  Released: 12/02/2011 Document Revised: 07/24/2013 Document Reviewed: 12/02/2011 [facility] Patient Information 2015 [place], [place]. This information is not intended to replace advice given to you by your health care provider. Make sure you discuss any questions you have with your health care provider.    Avoid climbing stairs and using the handrail to pull yourself up for the first 2-3 weeks after surgery.  Do not take baths for 2-4 weeks after surgery. Take showers once your health care provider approves. Pat incisions dry. Do not rub incisions with a washcloth or towel.  Return to work as directed by your health care provider.  Drink enough fluids to keep your urine clear or pale yellow.  Do not strain to have a bowel movement. Eat high-fiber foods if you become constipated. You may also take a medicine to help you have a bowel movement (laxative) as directed by your health care provider.  Resume sexual activity as directed by your health care provider. SEEK MEDICAL CARE IF:   You develop a skin rash.   Your weight is increasing each day over 2-3 days.  Your weight increases by 2 or more pounds (1 kg) in a single day.  You have a fever. SEEK IMMEDIATE MEDICAL CARE IF:   You develop chest pain that is not coming from your incision.  You develop shortness of breath or difficulty breathing.  You have drainage, redness, swelling, or pain at your incision site.  You have pus coming from your incision.  You develop light-headedness. MAKE SURE YOU:  Understand these directions.  Will watch your condition.  Will get help right away if you are not doing well or get worse. Document Released: 09/26/2004 Document Revised: 07/24/2013 Document Reviewed: 08/09/2012 [facility] Patient Information 2015 [place], [place]. This information is not intended to replace advice given to you by your health care provider. Make sure you discuss any questions you have with your health care  provider. Biventricular Pacemaker Implantation, Care After Refer to this sheet in the next few weeks. These instructions provide you with information on caring for yourself after your procedure. Your health care provider may also give you more specific instructions. Your treatment has been planned according to current medical practices, but problems sometimes occur. Call your health care provider if you have any problems or questions after your procedure. WHAT TO EXPECT AFTER THE PROCEDURE  You may feel pain. Some pain is normal. It may last a few days.  A slight bump may be seen over the skin where the device was placed. Sometimes, it is possible to feel the device under the skin. This is normal.  In the months and years afterward, your health care provider will check the device, the leads, and the battery every few months. Eventually, when the battery is low, the device will be replaced. HOME CARE INSTRUCTIONS Medicines  Take medicines only as directed by your health care provider.  If you were prescribed an antibiotic medicine, finish it all even if you start to feel better.   Do not take any other medicines without asking your health care  provider first. Some medicines, including certain painkillers, can cause bleeding in your stomach after surgery. Wound Care  Do not remove the bandage on your chest until directed to do so by your health care provider.  After your bandage is removed, you may see pieces of tape called skin adhesive strips over the area where the cut was made (incision site). Let them fall off on their own.   Check the incision site every day to make sure it is not infected, bleeding, or starting to pull apart.  Do not use lotions or ointments near the incision site unless directed to do so.   Keep the incision area clean and dry for 2-3 days after the procedure or as directed by your health care provider. It takes several weeks for the incision site to completely  heal.   Do not take baths, swim, or use a hot tub until your health care provider approves. Activities  Try to walk a little every day. Exercising is important after this procedure. It is also important to use your shoulder on the side of the pacemaker in daily tasks that do not require exaggerated motion.  Avoid sudden jerking, pulling, or chopping movements that pull your upper arm far away from your body for at least 6 weeks.   Do not lift your upper arm above your shoulders for at least 6 weeks. This means no tennis, golf, or swimming for this period of time. If you sleep with the arm above your head, use a restraint to prevent this from happening as you sleep.  You may go back to work when your health care provider says it is okay. Check with your health care provider before you start to drive or play sports. Other Instructions  Follow diet instructions if they were provided. You should be able to eat what you usually do right away, but you may need to limit your salt intake.   Weigh yourself every day. If you suddenly gain weight, fluid may be building up in your body.   Always carry your pacemaker identification card with you. The card should list the implant date, device model, and manufacturer. Consider wearing a medical alert bracelet or necklace.   Tell all health care providers that you have a pacemaker. This may prevent them from giving you a magnetic resource imaging scan (MRI) because of the strong magnets used during that test.   If you must pass through a metal detector, quickly walk through it. Do not stop under the detector or stand near it.   Avoid places or objects with a strong electric or magnetic field, including:   Engineer, maintenance. When at the airport, let officials know you have a pacemaker. Your ID card will let you be checked in a way that is safe for you and that will not damage your pacemaker. Also, do not let a security person wave a magnetic  wand near your pacemaker. That can make it stop working.   Power plants.   Large electrical generators.   Radiofrequency transmission towers, such as cell phone and radio towers.   Do not use amateur (ham) radio equipment or electric (arc) welding torches. Some devices are safe to use if held at least 1 foot from your pacemaker. These include power tools, lawn mowers, and speakers. If you are unsure of whether something is safe to use, ask your health care provider.   You may safely use electric blankets, heating pads, computers, and microwave ovens.   When  using your cell phone, hold it to the ear opposite the pacemaker. Do not leave your cell phone in a pocket over the pacemaker.   Keep all follow-up visits as directed by your health care provider. This is how your health care provider makes sure your chest is healing the way it should. Ask your health care provider when you should come back to have your stitches or staples taken out.   Have your pacemaker checked every 3-6 months or as directed by your health care provider. Most pacemakers last for 4-8 years before a new one is needed.  SEEK MEDICAL CARE IF:  You gain weight suddenly.   Your legs or feet swell more than they have before.   It feels like your heart is fluttering or skipping beats (heart palpitations).  You have a fever. SEEK IMMEDIATE MEDICAL CARE IF:   You have chest pain.   You feel more short of breath than you have felt before.   You feel more light-headed than you have felt before.   You have problems with your incision site, such as swelling or bleeding, or it starts to open up.   You notice signs of infection around your incision site. Watch for:   Warmth.   Redness.   Worsening pain.   Swelling.   Fluid leaking from the incision site.  Document Released: 12/02/2011 Document Revised: 07/24/2013 Document Reviewed: 12/02/2011 [facility] Patient Information 2015 [place],  [place]. This information is not intended to replace advice given to you by your health care provider. Make sure you discuss any questions you have with your health care provider.

## 2014-05-26 NOTE — Progress Notes (Signed)
      [address]       [place] [address]             [phone]      4 Days Post-Op Procedure(s) (LRB): PERMANENT PACEMAKER INSERTION (Left)   Subjective:  Kevin English has no complaints this morning.  He really wants to go home today. He is ambulating and has had a BM  Objective: Vital signs in last 24 hours: Temp:  [98.1 F (36.7 C)-98.3 F (36.8 C)] 98.3 F (36.8 C) (03/05 0549) Pulse Rate:  [72-83] 72 (03/05 0549) Cardiac Rhythm:  [-] Ventricular paced (03/05 0824) Resp:  [18-20] 18 (03/05 0549) BP: (111-124)/(64-79) 124/79 mmHg (03/05 0549) SpO2:  [91 %-95 %] 93 % (03/05 0549) Weight:  [187 lb 4.8 oz (84.959 kg)] 187 lb 4.8 oz (84.959 kg) (03/05 0549)  Intake/Output from previous day: 03/04 0701 - 03/05 0700 In: 500 [P.O.:480; I.V.:20] Out: 475 [Urine:475]  General appearance: alert, cooperative and no distress Heart: regular rate and rhythm, paced Lungs: clear to auscultation bilaterally Abdomen: soft, non-tender; bowel sounds normal; no masses,  no organomegaly Extremities: edema trace Wound: clean andd ry  Lab Results:  Recent Labs  05/25/14 0507 05/26/14 0550  WBC 24.0* 15.9*  HGB 11.1* 13.5  HCT 34.2* 41.2  PLT 481* 392   BMET:  Recent Labs  05/25/14 0507 05/26/14 0550  NA 137 136  K 4.1 3.8  CL 107 105  CO2 24 24  GLUCOSE 133* 111*  BUN 26* 20  CREATININE 1.78* 1.56*  CALCIUM 9.0 9.0    PT/INR:  Recent Labs  05/26/14 0550  LABPROT 19.9*  INR 1.68*   ABG    Component Value Date/Time   PHART 7.498* 05/20/2014 0748   HCO3 30.1* 05/20/2014 0748   TCO2 31 05/20/2014 0748   ACIDBASEDEF 6.0* 05/16/2014 0516   O2SAT 95.0 05/20/2014 0748   CBG (last 3)  No results for input(s): GLUCAP in the last 72 hours.  Assessment/Plan: S/P Procedure(s) (LRB): PERMANENT PACEMAKER INSERTION (Left)  1. CV- remains hemodynamically stable, continue Lopressor 2. Pulm- no acute issues, continue IS at discharge 3. Renal-  creatinine stable at 1.56, weight is below baseline 4. ID- Leukocytosis resolving, no fever, C.Diff negative- likely due to dose of steroids, CXR is clear 5. Dispo- patient stable, INR is trending back up, will d/c home today   LOS: 11 days    Kevin English 05/26/2014

## 2014-05-30 ENCOUNTER — Ambulatory Visit (INDEPENDENT_AMBULATORY_CARE_PROVIDER_SITE_OTHER): Payer: Managed Care, Other (non HMO) | Admitting: *Deleted

## 2014-05-30 DIAGNOSIS — I5033 Acute on chronic diastolic (congestive) heart failure: Secondary | ICD-10-CM

## 2014-05-30 DIAGNOSIS — Z5181 Encounter for therapeutic drug level monitoring: Secondary | ICD-10-CM

## 2014-05-30 DIAGNOSIS — I4891 Unspecified atrial fibrillation: Secondary | ICD-10-CM

## 2014-05-30 DIAGNOSIS — Z954 Presence of other heart-valve replacement: Secondary | ICD-10-CM

## 2014-05-30 DIAGNOSIS — I050 Rheumatic mitral stenosis: Secondary | ICD-10-CM

## 2014-05-30 LAB — POCT INR: INR: 1.7

## 2014-05-31 ENCOUNTER — Telehealth: Payer: Self-pay | Admitting: Cardiology

## 2014-05-31 NOTE — Telephone Encounter (Signed)
New Msg          Pt wife calling, concerned that new BP medications are causing pt BP to drop too low.  Today BP was 107/72 pulse 72.

## 2014-05-31 NOTE — Telephone Encounter (Signed)
Spoke with pt's wife in regards to pt's BP. Wife felt like BP may be getting too low on new medications. Informed wife that BP and pulse were WNL. Wife concerned about husband not being as active as before his surgery. Wife also concerned that at the hospital they had stated that they want pt to walk around for 5 mins at home and pt has been unable to do that thus far. Informed wife that I would speak to Dr. [name] about concern and call her back. Spoke with Dr. [name] and Dr. [name] feels like fatigue is coming from recent procedure and wants to refer pt to cardiac rehab. Informed wife that Dr. [name] feels like fatigue is related to recent surgery and that she wants to refer pt to cardiac rehab. Wife stated that hospital had said that they were going to refer her husband to cardiac rehab. Informed wife that Karlene Einstein was filling out cardiac rehab papers to be sent in as well. Informed wife to contact our office if husband worsened or new concerns came up before appt with Richardson Dopp on the 07/07/2022.   Wife verbalized understanding and was in agreement with this plan.

## 2014-05-31 NOTE — Telephone Encounter (Signed)
Cardiac rehab referral faxed

## 2014-06-04 ENCOUNTER — Ambulatory Visit (INDEPENDENT_AMBULATORY_CARE_PROVIDER_SITE_OTHER): Payer: Managed Care, Other (non HMO) | Admitting: *Deleted

## 2014-06-04 DIAGNOSIS — I4891 Unspecified atrial fibrillation: Secondary | ICD-10-CM

## 2014-06-04 DIAGNOSIS — Z954 Presence of other heart-valve replacement: Secondary | ICD-10-CM

## 2014-06-04 DIAGNOSIS — I050 Rheumatic mitral stenosis: Secondary | ICD-10-CM

## 2014-06-04 DIAGNOSIS — I5033 Acute on chronic diastolic (congestive) heart failure: Secondary | ICD-10-CM

## 2014-06-04 DIAGNOSIS — I442 Atrioventricular block, complete: Secondary | ICD-10-CM

## 2014-06-04 DIAGNOSIS — Z5181 Encounter for therapeutic drug level monitoring: Secondary | ICD-10-CM

## 2014-06-04 LAB — MDC_IDC_ENUM_SESS_TYPE_INCLINIC
Battery Impedance: 100 Ohm
Battery Voltage: 2.79 V
Brady Statistic AP VP Percent: 0.1 % — CL
Lead Channel Impedance Value: 519 Ohm
Lead Channel Pacing Threshold Amplitude: 0.75 V
Lead Channel Pacing Threshold Pulse Width: 0.4 ms
Lead Channel Setting Pacing Pulse Width: 0.4 ms
MDC IDC MSMT LEADCHNL RA SENSING INTR AMPL: 2.8 mV
MDC IDC MSMT LEADCHNL RV IMPEDANCE VALUE: 715 Ohm
MDC IDC MSMT LEADCHNL RV PACING THRESHOLD AMPLITUDE: 0.75 V
MDC IDC MSMT LEADCHNL RV PACING THRESHOLD PULSEWIDTH: 0.4 ms
MDC IDC MSMT LEADCHNL RV SENSING INTR AMPL: 11.2 mV
MDC IDC SET LEADCHNL RA PACING AMPLITUDE: 3.5 V
MDC IDC SET LEADCHNL RV PACING AMPLITUDE: 3.5 V
MDC IDC SET LEADCHNL RV SENSING SENSITIVITY: 4 mV
MDC IDC STAT BRADY AP VS PERCENT: 0.1 % — AB
MDC IDC STAT BRADY AS VP PERCENT: 99.9 %
MDC IDC STAT BRADY AS VS PERCENT: 0.1 % — AB

## 2014-06-04 LAB — POCT INR: INR: 3.9

## 2014-06-04 NOTE — Progress Notes (Signed)
Wound check appointment. Steri-strips removed. Wound without redness or edema. Incision edges approximated, wound well healed. Wife states that she noticed bleeding from wound yesterday (INR today 3.9---dose adjusted by CC). Wound stable today. Normal device function. Thresholds, sensing, and impedances consistent with implant measurements. Device programmed at 3.5V for extra safety margin until 3 month visit. Histogram distribution appropriate for patient and level of activity. No mode switches or high ventricular rates noted. Patient educated about wound care, arm mobility, lifting restrictions. ROV in 3 months with JA.

## 2014-06-08 ENCOUNTER — Other Ambulatory Visit: Payer: Self-pay | Admitting: Thoracic Surgery (Cardiothoracic Vascular Surgery)

## 2014-06-08 DIAGNOSIS — I050 Rheumatic mitral stenosis: Secondary | ICD-10-CM

## 2014-06-11 ENCOUNTER — Ambulatory Visit
Admission: RE | Admit: 2014-06-11 | Discharge: 2014-06-11 | Disposition: A | Discharge status: Home / Self Care | Payer: Managed Care, Other (non HMO) | Source: Ambulatory Visit | Attending: Thoracic Surgery (Cardiothoracic Vascular Surgery) | Admitting: Thoracic Surgery (Cardiothoracic Vascular Surgery)

## 2014-06-11 ENCOUNTER — Ambulatory Visit (INDEPENDENT_AMBULATORY_CARE_PROVIDER_SITE_OTHER): Payer: Self-pay | Admitting: Surgical

## 2014-06-11 VITALS — BP 109/79 | HR 72 | Temp 97.8°F | Resp 16 | Ht 68.0 in | Wt 185.0 lb

## 2014-06-11 DIAGNOSIS — Z954 Presence of other heart-valve replacement: Secondary | ICD-10-CM

## 2014-06-11 DIAGNOSIS — T8131XA Disruption of external operation (surgical) wound, not elsewhere classified, initial encounter: Secondary | ICD-10-CM

## 2014-06-11 DIAGNOSIS — I050 Rheumatic mitral stenosis: Secondary | ICD-10-CM

## 2014-06-11 DIAGNOSIS — Z8679 Personal history of other diseases of the circulatory system: Secondary | ICD-10-CM

## 2014-06-11 DIAGNOSIS — Z9889 Other specified postprocedural states: Secondary | ICD-10-CM

## 2014-06-11 DIAGNOSIS — Z952 Presence of prosthetic heart valve: Secondary | ICD-10-CM

## 2014-06-11 NOTE — Patient Instructions (Signed)
The patient and wife have been given instructions regarding wound care was a right groin incision.

## 2014-06-11 NOTE — Progress Notes (Signed)
Kevin English [address]       [place] [address]             [phone]           [address]       [place] [address]             [phone]                  [facility] Medical Record #[id number] Date of Birth: 1955-12-09  Referring IR:[email or web address], Jamse Belfast, MD Primary Cardiology: Primary Care:GRISSO,GREG, MD  Chief Complaint:  Follow Up Visit   Date of Procedure:05/15/2014  Preoperative Diagnosis:   Severe Mitral Stenosis  Recurrent Paroxysmal Atrial Fibrillation  Postoperative Diagnosis:Same  Procedure:   Minimally-Invasive Mitral Valve Replacement Sorin Carbomedics Optiform bileaflet mechanical valve (size 66mm, catalog #[id number], serial #[id number])   Maze Procedure Left atrial lesion set using cryothermy Clipping of Left Atrial Appendage (Atricure left atrial clip, size 45 mm)  Surgeon:Clarence H. Roxy Manns, MD  Assistant:Erin Barrett, PA-C  Anesthesia:E. Annye Asa, MD  Operative Findings:  Rheumatic mitral valve disease with severe mitral stenosis  Mild aortic insufficiency  Normal left ventricular systolic function  History of Present Illness:    Patient is a 59 year old male seen in follow-up for the above procedure. Currently he feels as though he is having some shortness of breath but overall is progressing nicely. His Coumadin is being managed by the Coumadin clinic. He has had some breakdown of his right groin wound. This is not associated with any purulent drainage or evidence of cellulitis. He denies fevers, chills or other constitutional symptoms.    Zubrod Score: At the time of surgery this patient's most appropriate activity status/level should be described as: []     0    Normal activity, no symptoms []     1    Restricted in physical strenuous activity but ambulatory, able to do out light  work []     2    Ambulatory and capable of self care, unable to do work activities, up and about                 >50 % of waking hours                                                                                   []     3    Only limited self care, in bed greater than 50% of waking hours []     4    Completely disabled, no self care, confined to bed or chair []     5    Moribund  History  Smoking status  . Current Every Day Smoker -- 0.25 packs/day for 40 years  . Types: Cigarettes  Smokeless tobacco  . Never Used       Allergies  Allergen Reactions  . Contrast Media [Iodinated Diagnostic Agents] Hives and Other (See Comments)    Patient started sneezing and coughing, patient broke out in hives, patient needs 13 hour prep if having IV contrast  . Atorvastatin Other (See Comments)    Myalgias  . Fenofibrate Other (  See Comments)    MYALGIAS   . Pravastatin Other (See Comments)    Myalgias     Current Outpatient Prescriptions  Medication Sig Dispense Refill  . acetaminophen (TYLENOL) 325 MG tablet Take 325 mg by mouth every 6 (six) hours as needed (pain).     Marland Kitchen ALPRAZolam (XANAX) 0.5 MG tablet Take 0.5-1 mg by mouth at bedtime as needed for anxiety.     Marland Kitchen aspirin EC 81 MG tablet Take 1 tablet (81 mg total) by mouth daily.    Marland Kitchen HYDROcodone-acetaminophen (NORCO/VICODIN) 5-325 MG per tablet Take 1-2 tablets by mouth every 4 (four) hours as needed for moderate pain. 30 tablet 0  . levothyroxine (SYNTHROID) 75 MCG tablet Take 1 tablet (75 mcg total) by mouth daily before breakfast. 90 tablet 3  . metoprolol tartrate (LOPRESSOR) 25 MG tablet Take 1 tablet (25 mg total) by mouth 2 (two) times daily. 60 tablet 3  . nystatin-triamcinolone (MYCOLOG II) cream APPLY TO AFFECTED AREA TWICE A DAY 30 g 0  . sertraline (ZOLOFT) 50 MG tablet Take 1 tablet (50 mg total) by mouth daily. 90 tablet 4  . warfarin (COUMADIN) 5 MG tablet Take 2.5-5 mg by mouth daily. Sun Tues Thurs 2.5 mg. , and Mon  Wed Fri and Sat 5 mg    . traMADol (ULTRAM) 50 MG tablet Take 1-2 tablets (50-100 mg total) by mouth every 4 (four) hours as needed for moderate pain. (Patient not taking: Reported on 06/11/2014) 30 tablet 0   No current facility-administered medications for this visit.       Physical Exam: BP 109/79 mmHg  Pulse 72  Temp(Src) 97.8 F (36.6 C) (Oral)  Resp 16  Ht 5\' 8"  (1.727 m)  Wt 185 lb (83.915 kg)  BMI 28.14 kg/m2  SpO2 98%  General appearance: alert, cooperative and no distress Heart: regular rate and rhythm and ejection click present Lungs: clear to auscultation bilaterally Abdomen: soft, non-tender; bowel sounds normal; no masses,  no organomegaly Extremities: No edema Wound: The right groin incision has a superficial dehiscence. There is a granulomatous base of tissue with some fatty necrosis that I debrided in the room. There is no erythema. There is no purulence.   Diagnostic Studies & Laboratory data:         Recent Radiology Findings: Dg Chest 2 View  06/11/2014   CLINICAL DATA:  Chest pain and shortness of breath. Mitral valve replacement.  EXAM: CHEST  2 VIEW  COMPARISON:  Chest x-rays-  Dated 05/25/2014 and 04/16/2014  FINDINGS: Pacemaker in place. Prosthetic mitral valve. Left atrial appendage clip.  Heart size and pulmonary vascularity are now normal in the interstitial edema seen on prior is exams has almost completely resolved.  The left effusion has resolved. There is a small residual right effusion.  IMPRESSION: 1. Almost complete resolution of interstitial edema and accentuation of the interstitial markings. 2. Resolution of small left effusion. 3. Small residual right effusion.   Electronically Signed   By: [name] M.D.   On: 06/11/2014 13:01      I have independently reviewed the above radiology findings and reviewed findings  with the patient.  Recent Labs: Lab Results  Component Value Date   WBC 15.9* 05/26/2014   HGB 13.5 05/26/2014   HCT 41.2  05/26/2014   PLT 392 05/26/2014   GLUCOSE 111* 05/26/2014   CHOL 280* 01/15/2014   TRIG 572.0* 01/15/2014   HDL 23.20* 01/15/2014   LDLDIRECT 148.2 01/15/2014   ALT  18 05/21/2014   AST 15 05/21/2014   NA 136 05/26/2014   K 3.8 05/26/2014   CL 105 05/26/2014   CREATININE 1.56* 05/26/2014   BUN 20 05/26/2014   CO2 24 05/26/2014   TSH 0.63 01/15/2014   INR 3.9 06/04/2014   HGBA1C 6.5* 05/11/2014      Assessment / Plan:  The patient is progressing well. He has no murmur on cardiac exam. He has a crisp valve click. The right groin incision has a superficial dehiscence. This will require twice daily wet to dry saline dressing changes. The wife is comfortable doing this and we have given her instructions. We will see again in 1 week or prior should this wound have any worsening signs of infection.        GOLD,WAYNE E 06/11/2014 1:51 PM

## 2014-06-12 DIAGNOSIS — Z950 Presence of cardiac pacemaker: Secondary | ICD-10-CM | POA: Insufficient documentation

## 2014-06-12 NOTE — Progress Notes (Signed)
Cardiology Office Note   Date:  06/13/2014   ID:  Kevin English, DOB Oct 17, 1955, MRN [id number]  PCP:  Kevin Rile, MD  Cardiologist:  Dr. Ena English   Electrophysiologist:  Dr. Thompson English   Chief Complaint  Patient presents with  . Hospitalization Follow-up    s/p Mechanical MVR  . Congestive Heart Failure  . Atrial Fibrillation     History of Present Illness: Kevin English is a 59 y.o. male with a hx of HTN, HL (intol to statins), former smoker, PAFib, Rheumatic heart disease with severe mitral stenosis, diastolic HF, depression, CKD, renal cell CA, OSA.  He has been on Coumadin.  He had been prepared for minimally invasive MVR and placed on Amiodarone.  However, he developed clear cell renal cell CA and underwent R nephrectomy.  He developed fatigue with beta-blocker and Bystolic was DC'd.    He was ultimately stabilized and admitted 2/23-3/5.  He underwent Minimally-Invasive Mitral Valve Replacement (Sorin Carbomedics Optiform bileaflet mechanical valve) and Maze Procedure with Left atrial lesion set using cryothermy and Clipping of Left Atrial Appendage with Dr. Roxy English.  Post op course was complicated by complete heart block.  He ultimately required implantation of a Medtronic Adapta L dual-chamber pacemaker.  He also developed HCAP and required IV antibiotics, a/c diastolic HF requiring diuresis.  He returns for FU.  The patient tells me that he feels "lousy." He is overall frustrated about his health, recent surgeries and need for pacemaker. He has dehiscence of his right groin wound that is being packed. His wife handles dressing changes. She tells me that it is improving. Overall, he feels that he is better. He is not walking that much. He feels short of breath at times. When I ask him further about this, he doesn't really think his breathing is any worse. His chest remains sore. This is improved. He describes NYHA 2b symptoms. He denies orthopnea, PND or edema. He wears  CPAP nightly. He had diarrhea in the hospital. C. difficile was negative. This is resolved. He denies fever, cough, wheezing. He denies melena or hematochezia. He denies vomiting or diarrhea. He tells me that his tolerance for just about anything is poor. His wife tells me that he gets frustrated easily. He is currently on Zoloft and Xanax.   Studies/Reports Reviewed Today:  Dg Chest 2 View  06/11/2014    IMPRESSION: 1. Almost complete resolution of interstitial edema and accentuation of the interstitial markings. 2.  Resolution of small left effusion. 3. Small residual right effusion.   Electronically Signed   By: Kevin English M.D.   On: 06/11/2014 13:01    Carotid US 04/16/14 Bilateral - 1% to 39% ICA stensosis.  TEE 0/99/83 - Systolic function was normal. Wall motion was normal - Aortic valve: There was mild regurgitation. - Mitral valve: Valve area by pressure half-time: 1.4 cm^2. - Left atrium: The atrium was moderately dilated.  Impressions:  Rheumatic mitral valve with moderate leaflet tip thickening and minimal calcifications. Moderate mitral stenosis and trace mitral regurgitation.  Cardiac cath 08/28/13 LM:  Normal LAD:  Proximal 40-50%, distal 50-60% LCx:  Small OM 70-80% (not amenable to PCI or CABG), PDA 70% LPDA:  10-20% RCA:  Mid 30-40%   Past Medical History  Diagnosis Date  . Hernia   . Chronic rhinitis   . Diverticulitis of colon 15 years ago  . Hyperlipidemia   . Hypogonadism male   . Obesity   . Hypothyroidism   . Persistent  atrial fibrillation     a. sustained 160-180 on e-CARDIO monitor placed 07/29/2013. Placed on IV amiodarone at end of May 2015 due to continued paroxysms with RVR.  Marland Kitchen Rheumatic heart disease   . Mitral stenosis     a. Dx 07/2013 - through workup of 2D echo, TEE, and cath, felt to be moderate.  . Paroxysmal atrial flutter   . CAD (coronary artery disease)     a. By cath 08/2013: 70% distal cx-prox LPDA; tandem mod LAD lesions, o/w mild dz.    . Renal mass     a. Dx 08/2013: concerning for renal cancer.  Marland Kitchen HTN (hypertension)     hx of- under control  . Stroke 1998    "MRI showed 3 mild strokes"  . Borderline diabetes   . Depression   . Anxiety   . Dizzy spells   . Deafness in left ear   . Lung nodule seen on imaging study 04/10/2014    5 mm nodule right lower lobe  . OSA on CPAP     cpap  . CKD (chronic kidney disease), stage II     his creat. runs 2-2.5; hasnt seen neph yet - sees dr. Tresa [facility] at [facility]  . Renal cell carcinoma of right kidney 09/05/2013    clear cell renal cell carcinoma of right kidney treated by radical nephrectomy, Fuhrman grade 2-3, with maximum tumor diameter 2.7 cm, all tumor to find confined to the kidney, and all surgical margins negative ([id number]).   . Yeast dermatitis 04/19/2014  . History of shingles   . S/P minimally invasive mitral valve replacement with metallic valve and maze procedure 05/15/2014    56mm Sorin Carbomedics Optiform mechanical valve placed via right mini thoracotomy approach  . S/P Minimally invasive maze operation for atrial fibrillation 05/15/2014    Left side lesion set using cryothermy with clipping of LA appendage  . Pacemaker 05/22/2014    for complete heart block    Past Surgical History  Procedure Laterality Date  . Tee without cardioversion N/A 08/16/2013    Procedure: TRANSESOPHAGEAL ECHOCARDIOGRAM (TEE);  Surgeon: Kevin Spark, MD;  Location: [place];  Service: Cardiovascular;  Laterality: N/A;  . Inguinal hernia repair Bilateral first one in 80's    "I had one side done twice"  . Cardiac catheterization  08/28/13  . Eye surgery Right 1990    "reconstructive, lens implant- from paintball accident"  . Robot assisted laparoscopic nephrectomy Right 09/05/2013    Procedure: ROBOTIC ASSISTED LAPAROSCOPIC RIGHT RADICAL NEPHRECTOMY;  Surgeon: Kevin Creamer, MD;  Location: [facility];  Service: Urology;  Laterality: Right;  . Left and right heart catheterization with  coronary angiogram N/A 08/28/2013    Procedure: LEFT AND RIGHT HEART CATHETERIZATION WITH CORONARY ANGIOGRAM;  Surgeon: Kevin Man, MD;  Location: [facility] CATH LAB;  Service: Cardiovascular;  Laterality: N/A;  . Mitral valve replacement Right 05/15/2014    Procedure: MINIMALLY INVASIVE MITRAL VALVE (MV) REPLACEMENT;  Surgeon: Rexene Alberts, MD;  Location: [place];  Service: Open Heart Surgery;  Laterality: Right;  . Minimally invasive maze procedure N/A 05/15/2014    Procedure: MINIMALLY INVASIVE MAZE PROCEDURE;  Surgeon: Rexene Alberts, MD;  Location: [place];  Service: Open Heart Surgery;  Laterality: N/A;  . Tee without cardioversion N/A 05/15/2014    Procedure: TRANSESOPHAGEAL ECHOCARDIOGRAM (TEE);  Surgeon: Rexene Alberts, MD;  Location: [place];  Service: Open Heart Surgery;  Laterality: N/A;  . Permanent pacemaker insertion Left 05/22/2014  Procedure: PERMANENT PACEMAKER INSERTION;  Surgeon: Kevin Grayer, MD;  Location: [facility] C[facility] LAB;  Service: Cardiovascular;  Laterality: Left;     Current Outpatient Prescriptions  Medication Sig Dispense Refill  . acetaminophen (TYLENOL) 325 MG tablet Take 325 mg by mouth every 6 (six) hours as needed (pain).     Marland Kitchen ALPRAZolam (XANAX) 0.5 MG tablet Take 0.5-1 mg by mouth at bedtime as needed for anxiety.     Marland Kitchen aspirin EC 81 MG tablet Take 1 tablet (81 mg total) by mouth daily.    Marland Kitchen levothyroxine (SYNTHROID) 75 MCG tablet Take 1 tablet (75 mcg total) by mouth daily before breakfast. 90 tablet 3  . metoprolol tartrate (LOPRESSOR) 25 MG tablet Take 1 tablet (25 mg total) by mouth 2 (two) times daily. 60 tablet 3  . nystatin-triamcinolone (MYCOLOG II) cream APPLY TO AFFECTED AREA TWICE A DAY 30 g 0  . sertraline (ZOLOFT) 50 MG tablet Take 1 tablet (50 mg total) by mouth daily. 90 tablet 4  . warfarin (COUMADIN) 5 MG tablet Take 2.5-5 mg by mouth daily. Sun Tues Thurs 2.5 mg. , and Mon Wed Fri and Sat 5 mg     No current facility-administered medications for this  visit.    Allergies:   Contrast media; Atorvastatin; Fenofibrate; and Pravastatin    Social History:  The patient  reports that he quit smoking about 4 weeks ago. His smoking use included Cigarettes. He has a 10 pack-year smoking history. He has never used smokeless tobacco. He reports that he does not drink alcohol or use illicit drugs.   Family History:  The patient's family history includes Heart attack in his mother; Hypertension in his mother and another family member; Stroke in his mother.    ROS:   Please see the history of present illness.   Review of Systems  Cardiovascular: Positive for dyspnea on exertion.  All other systems reviewed and are negative.    PHYSICAL EXAM: VS:  BP 112/82 mmHg  Pulse 64  Ht 5\' 8"  (1.727 m)  Wt 187 lb (84.823 kg)  BMI 28.44 kg/m2    Wt Readings from Last 3 Encounters:  06/13/14 187 lb (84.823 kg)  06/11/14 185 lb (83.915 kg)  05/26/14 187 lb 4.8 oz (84.959 kg)     GEN: Well nourished, well developed, in no acute distress HEENT: normal Neck: no JVD, no masses Cardiac:  Mechanical S1/Normal S2, RRR; no murmur ,  no rubs or gallops, no edema  Chest: Right chest incision well healed without erythema or discharge Respiratory:  Decreased breath sounds bilaterally, no wheezing, rhonchi or rales. GI: soft, nontender, nondistended, + BS MS: no deformity or atrophy Skin: warm and dry  Neuro:  CNs II-XII intact, Strength and sensation are intact Psych: Normal affect   EKG:  EKG is ordered today.  It demonstrates:   NSR, HR 64, V paced   Recent Labs: 08/10/2013: Pro B Natriuretic peptide (BNP) 1467.0* 01/15/2014: TSH 0.63 05/16/2014: Magnesium 2.7* 05/19/2014: B Natriuretic Peptide 309.5* 05/21/2014: ALT 18 05/26/2014: BUN 20; Creatinine 1.56*; Hemoglobin 13.5; Platelets 392; Potassium 3.8; Sodium 136    Lipid Panel    Component Value Date/Time   CHOL 280* 01/15/2014 0801   TRIG 572.0* 01/15/2014 0801   HDL 23.20* 01/15/2014 0801    CHOLHDL 12 01/15/2014 0801   VLDL 114.4* 01/15/2014 0801   LDLDIRECT 148.2 01/15/2014 0801      ASSESSMENT AND PLAN:  Mitral stenosis S/P minimally invasive mitral valve replacement with metallic valve  and maze procedure He is progressing slowly after recent surgery. I think most of his symptoms are likely related to deconditioning. His chest x-ray showed improved effusions. His exam is unremarkable. Blood pressure looks good. His pacemaker is functioning appropriately. He remains in sinus rhythm.   -  I have encouraged him to go to cardiac rehabilitation.   -  SBE prophylaxis card was given today.   -  Follow-up echocardiogram will be arranged.  -  Coumadin managed in our Coumadin clinic.  Paroxysmal atrial fibrillation Maintaining NSR. Continue beta blocker, Coumadin.  Essential hypertension Blood pressure controlled on current regimen.  Hyperlipidemia He is intolerant to statins. Over time, we could consider sending him to the lipid clinic. Given his current mental status, I would hold off on this for now.  Chronic diastolic CHF (congestive heart failure) Volume appears stable. He is not on diuretics.  Coronary artery disease involving native coronary artery of native heart without angina pectoris No apparent angina. If he continues to have issues with shortness of breath, we could try to initiate nitrates. He did have some moderate severe disease in a small OM branch that was not amenable to PCI. Continue aspirin, beta blocker.  CHB (complete heart block) S/P placement of cardiac pacemaker Follow up with EP as planned.  Sleep apnea Continue CPAP.  Renal cell carcinoma of right kidney-nephrectomy June 2015 Follow-up with urology as planned  CKD (chronic kidney disease), stage II BMET recently checked by primary care. I will request those records.  Depression  I have encouraged him to discuss this with his primary care physician. Continue current medical therapy. Consider  counseling.  Current medicines are reviewed at length with the patient today.  The patient does not have concerns regarding medicines.  The following changes have been made:  no change  Labs/ tests ordered today include:   Orders Placed This Encounter  Procedures  . EKG 12-Lead  . 2D Echocardiogram without contrast    Disposition:   FU with Dr. [name] 4 weeks.   Signed, Versie Starks, MHS 06/13/2014 12:09 PM    [facility] [place], [place], [place]  [address] Phone: [phone]; Fax: [phone]

## 2014-06-13 ENCOUNTER — Ambulatory Visit (INDEPENDENT_AMBULATORY_CARE_PROVIDER_SITE_OTHER): Payer: Managed Care, Other (non HMO) | Admitting: Physician Assistant

## 2014-06-13 ENCOUNTER — Ambulatory Visit (INDEPENDENT_AMBULATORY_CARE_PROVIDER_SITE_OTHER): Payer: Managed Care, Other (non HMO) | Admitting: *Deleted

## 2014-06-13 ENCOUNTER — Encounter: Payer: Self-pay | Admitting: Physician Assistant

## 2014-06-13 VITALS — BP 112/82 | HR 64 | Ht 68.0 in | Wt 187.0 lb

## 2014-06-13 DIAGNOSIS — I050 Rheumatic mitral stenosis: Secondary | ICD-10-CM

## 2014-06-13 DIAGNOSIS — I5033 Acute on chronic diastolic (congestive) heart failure: Secondary | ICD-10-CM | POA: Diagnosis not present

## 2014-06-13 DIAGNOSIS — Z950 Presence of cardiac pacemaker: Secondary | ICD-10-CM

## 2014-06-13 DIAGNOSIS — E785 Hyperlipidemia, unspecified: Secondary | ICD-10-CM

## 2014-06-13 DIAGNOSIS — Z954 Presence of other heart-valve replacement: Secondary | ICD-10-CM | POA: Diagnosis not present

## 2014-06-13 DIAGNOSIS — I2510 Atherosclerotic heart disease of native coronary artery without angina pectoris: Secondary | ICD-10-CM

## 2014-06-13 DIAGNOSIS — I10 Essential (primary) hypertension: Secondary | ICD-10-CM

## 2014-06-13 DIAGNOSIS — I480 Paroxysmal atrial fibrillation: Secondary | ICD-10-CM | POA: Diagnosis not present

## 2014-06-13 DIAGNOSIS — N182 Chronic kidney disease, stage 2 (mild): Secondary | ICD-10-CM

## 2014-06-13 DIAGNOSIS — I5032 Chronic diastolic (congestive) heart failure: Secondary | ICD-10-CM

## 2014-06-13 DIAGNOSIS — I442 Atrioventricular block, complete: Secondary | ICD-10-CM

## 2014-06-13 DIAGNOSIS — G4730 Sleep apnea, unspecified: Secondary | ICD-10-CM

## 2014-06-13 DIAGNOSIS — F32A Depression, unspecified: Secondary | ICD-10-CM

## 2014-06-13 DIAGNOSIS — I4891 Unspecified atrial fibrillation: Secondary | ICD-10-CM

## 2014-06-13 DIAGNOSIS — Z5181 Encounter for therapeutic drug level monitoring: Secondary | ICD-10-CM

## 2014-06-13 DIAGNOSIS — F329 Major depressive disorder, single episode, unspecified: Secondary | ICD-10-CM

## 2014-06-13 DIAGNOSIS — C641 Malignant neoplasm of right kidney, except renal pelvis: Secondary | ICD-10-CM

## 2014-06-13 LAB — POCT INR: INR: 4.7

## 2014-06-13 NOTE — Patient Instructions (Signed)
Your physician recommends that you schedule a follow-up appointment in: [place]  Your physician has requested that you have an echocardiogram. Echocardiography is a painless test that uses sound waves to create images of your heart. It provides your doctor with information about the size and shape of your heart and how well your heart's chambers and valves are working. This procedure takes approximately one hour. There are no restrictions for this procedure.  YOU HAVE BEEN GIVEN AN SBE CARD TO [facility] DUE TO MITRAL VALVE REPLACEMENT

## 2014-06-18 ENCOUNTER — Ambulatory Visit (INDEPENDENT_AMBULATORY_CARE_PROVIDER_SITE_OTHER): Payer: Self-pay | Admitting: Physician Assistant

## 2014-06-18 VITALS — BP 137/88 | HR 78 | Temp 97.0°F | Resp 20 | Ht 68.0 in | Wt 188.0 lb

## 2014-06-18 DIAGNOSIS — Z8679 Personal history of other diseases of the circulatory system: Secondary | ICD-10-CM

## 2014-06-18 DIAGNOSIS — Z954 Presence of other heart-valve replacement: Secondary | ICD-10-CM

## 2014-06-18 DIAGNOSIS — Z952 Presence of prosthetic heart valve: Secondary | ICD-10-CM

## 2014-06-18 DIAGNOSIS — I050 Rheumatic mitral stenosis: Secondary | ICD-10-CM

## 2014-06-18 DIAGNOSIS — Z9889 Other specified postprocedural states: Secondary | ICD-10-CM

## 2014-06-18 DIAGNOSIS — B372 Candidiasis of skin and nail: Secondary | ICD-10-CM

## 2014-06-18 DIAGNOSIS — I099 Rheumatic heart disease, unspecified: Secondary | ICD-10-CM

## 2014-06-18 NOTE — Progress Notes (Signed)
  HPI:  Kevin English reports today for wound check.  He was evaluated last week by Jadene Pierini PA-C for a post operative visit.  At that time he was noted to have a superficial wound dehiscence from his right groin.  There was no evidence of infection at that time and he was instructed to pack the wound with wet to dry dressing changes daily.  The patient is accompanied by his wife.  He is doing very well.  They have not been having any difficulty with his wound care.  He denies purulent drainage from the wound.  He also denies fevers.  The patient's wife states the wound is smaller in size.  Current Outpatient Prescriptions  Medication Sig Dispense Refill  . acetaminophen (TYLENOL) 325 MG tablet Take 325 mg by mouth every 6 (six) hours as needed (pain).     Marland Kitchen ALPRAZolam (XANAX) 0.5 MG tablet Take 0.5-1 mg by mouth at bedtime as needed for anxiety.     Marland Kitchen aspirin EC 81 MG tablet Take 1 tablet (81 mg total) by mouth daily.    Marland Kitchen levothyroxine (SYNTHROID) 75 MCG tablet Take 1 tablet (75 mcg total) by mouth daily before breakfast. 90 tablet 3  . metoprolol tartrate (LOPRESSOR) 25 MG tablet Take 1 tablet (25 mg total) by mouth 2 (two) times daily. 60 tablet 3  . nystatin-triamcinolone (MYCOLOG II) cream APPLY TO AFFECTED AREA TWICE A DAY 30 g 0  . sertraline (ZOLOFT) 50 MG tablet Take 1 tablet (50 mg total) by mouth daily. 90 tablet 4  . warfarin (COUMADIN) 5 MG tablet Take 2.5-5 mg by mouth daily. Sun Tues Thurs 2.5 mg. , and Mon Wed Fri and Sat 5 mg     No current facility-administered medications for this visit.    Physical Exam:  BP 137/88 mmHg  Pulse 78  Temp(Src) 97 F (36.1 C)  Resp 20  Ht 5\' 8"  (1.727 m)  Wt 188 lb (85.276 kg)  BMI 28.59 kg/m2  SpO2 98%  Gen: no apparent distress Heart: RRR Lungs: Diminished left base Skin: Right groin wound is open 2-3 cm.  Wound is healthy pink tissue with no evidence of infection or purulent drainage present.  A/P:  1. S/P Mini MVR, MAZE 2.  Superficial Right Groin Dehiscence- wound is clean and healing.  Will continue current dressing changes, with instructions to call our office should he develop fevers or purulence develop 3. RTC in 2 weeks with CXR to assess pleural effusions on CXR from 3/21 and for post operative evaluation and wound check with Dr. Geraldo Docker, PA-C [facility] [phone]

## 2014-06-22 ENCOUNTER — Encounter: Payer: Self-pay | Admitting: Internal Medicine

## 2014-06-27 ENCOUNTER — Encounter: Payer: Self-pay | Admitting: Physician Assistant

## 2014-06-27 ENCOUNTER — Ambulatory Visit (INDEPENDENT_AMBULATORY_CARE_PROVIDER_SITE_OTHER): Payer: Managed Care, Other (non HMO) | Admitting: *Deleted

## 2014-06-27 ENCOUNTER — Ambulatory Visit (HOSPITAL_COMMUNITY): Payer: Managed Care, Other (non HMO) | Attending: Cardiology | Admitting: Radiology

## 2014-06-27 ENCOUNTER — Telehealth: Payer: Self-pay | Admitting: *Deleted

## 2014-06-27 DIAGNOSIS — I5033 Acute on chronic diastolic (congestive) heart failure: Secondary | ICD-10-CM | POA: Diagnosis not present

## 2014-06-27 DIAGNOSIS — I050 Rheumatic mitral stenosis: Secondary | ICD-10-CM | POA: Insufficient documentation

## 2014-06-27 DIAGNOSIS — I4891 Unspecified atrial fibrillation: Secondary | ICD-10-CM | POA: Diagnosis not present

## 2014-06-27 DIAGNOSIS — Z5181 Encounter for therapeutic drug level monitoring: Secondary | ICD-10-CM

## 2014-06-27 DIAGNOSIS — Z954 Presence of other heart-valve replacement: Secondary | ICD-10-CM | POA: Diagnosis not present

## 2014-06-27 LAB — POCT INR: INR: 3.6

## 2014-06-27 NOTE — Telephone Encounter (Signed)
pt notified about echo results with verbal understanding to results given today. Pt advised to keep his appt 4/20 with Dr. [name].

## 2014-06-27 NOTE — Progress Notes (Signed)
Echocardiogram performed.  

## 2014-06-28 ENCOUNTER — Telehealth: Payer: Self-pay | Admitting: Cardiology

## 2014-06-28 NOTE — Telephone Encounter (Signed)
New message      Talk to Kevin English regarding his echo results from yesterday

## 2014-06-28 NOTE — Telephone Encounter (Signed)
Informed the pts wife that per Dr [name] to endorse to the pt that his new valve is working well, his LVEF is mildly decreased, which is not unusual after surgeries like his.  Informed the wife that per Dr [name], we will treat and follow it.  Informed the wife that per Dr [name], she will discuss more at his follow-up OV with her on 07/11/14.  Wife verbalized understanding and very gracious for all the assistance provided.

## 2014-06-28 NOTE — Telephone Encounter (Signed)
His new valve is working well, his LVEF is mildly decreased - not an unusual after surgeries like his. We will treat and follow it. We can discuss more at the clinic appointment on 07/11/14. Thank you, KN

## 2014-06-28 NOTE — Telephone Encounter (Signed)
Pts wife calling to speak with myself and Dr [name] in regards to the pt receiving his echo results from [facility] yesterday.  Pts wife states that the CMA read exactly what Richardson Dopp PA resulted for the pts echo, to the pt.  Wife states that the pt is extremely anxious and nervous of what the results were told to him.  Wife states the pt usually prefers his results called into her instead of him, because of anxiety and depression with coping with his disease process. Wife states she knows that the CMA who called the echo results in, did not know this.  Wife requesting that Dr [name] review the echo results as well and advise on this, to help alleviate the pts fears.  Informed the wife that Dr [name] is currently out of the office at this time, but his echo results are in her basket for review.  Informed the wife that I will send this message to Dr [name] for further review and recommendation and follow-up thereafter.

## 2014-07-02 ENCOUNTER — Other Ambulatory Visit: Payer: Self-pay | Admitting: *Deleted

## 2014-07-02 DIAGNOSIS — E038 Other specified hypothyroidism: Secondary | ICD-10-CM

## 2014-07-02 DIAGNOSIS — I5033 Acute on chronic diastolic (congestive) heart failure: Secondary | ICD-10-CM

## 2014-07-02 DIAGNOSIS — E785 Hyperlipidemia, unspecified: Secondary | ICD-10-CM

## 2014-07-02 DIAGNOSIS — I10 Essential (primary) hypertension: Secondary | ICD-10-CM

## 2014-07-02 DIAGNOSIS — I4891 Unspecified atrial fibrillation: Secondary | ICD-10-CM

## 2014-07-02 MED ORDER — WARFARIN SODIUM 5 MG PO TABS: ORAL_TABLET | ORAL | Status: DC

## 2014-07-03 MED ORDER — LEVOTHYROXINE SODIUM 75 MCG PO TABS: 75.0000 ug | ORAL_TABLET | Freq: Every day | ORAL | Status: DC

## 2014-07-03 MED ORDER — SERTRALINE HCL 50 MG PO TABS: 50.0000 mg | ORAL_TABLET | Freq: Every day | ORAL | Status: DC

## 2014-07-03 NOTE — Telephone Encounter (Signed)
Refill sent to pharmacy of choice and ok per Dr [name].

## 2014-07-05 ENCOUNTER — Other Ambulatory Visit: Payer: Self-pay | Admitting: Thoracic Surgery (Cardiothoracic Vascular Surgery)

## 2014-07-05 DIAGNOSIS — I4891 Unspecified atrial fibrillation: Secondary | ICD-10-CM

## 2014-07-09 ENCOUNTER — Ambulatory Visit
Admission: RE | Admit: 2014-07-09 | Discharge: 2014-07-09 | Disposition: A | Discharge status: Home / Self Care | Payer: Managed Care, Other (non HMO) | Source: Ambulatory Visit | Attending: Thoracic Surgery (Cardiothoracic Vascular Surgery) | Admitting: Thoracic Surgery (Cardiothoracic Vascular Surgery)

## 2014-07-09 ENCOUNTER — Encounter: Payer: Self-pay | Admitting: Thoracic Surgery (Cardiothoracic Vascular Surgery)

## 2014-07-09 ENCOUNTER — Ambulatory Visit (INDEPENDENT_AMBULATORY_CARE_PROVIDER_SITE_OTHER): Payer: Self-pay | Admitting: Thoracic Surgery (Cardiothoracic Vascular Surgery)

## 2014-07-09 VITALS — BP 139/83 | HR 71 | Resp 16 | Ht 68.0 in | Wt 184.0 lb

## 2014-07-09 DIAGNOSIS — T814XXD Infection following a procedure, subsequent encounter: Secondary | ICD-10-CM

## 2014-07-09 DIAGNOSIS — I4891 Unspecified atrial fibrillation: Secondary | ICD-10-CM

## 2014-07-09 DIAGNOSIS — Z9889 Other specified postprocedural states: Secondary | ICD-10-CM

## 2014-07-09 DIAGNOSIS — Z954 Presence of other heart-valve replacement: Secondary | ICD-10-CM

## 2014-07-09 DIAGNOSIS — Z952 Presence of prosthetic heart valve: Secondary | ICD-10-CM

## 2014-07-09 DIAGNOSIS — Z8679 Personal history of other diseases of the circulatory system: Secondary | ICD-10-CM

## 2014-07-09 DIAGNOSIS — IMO0001 Reserved for inherently not codable concepts without codable children: Secondary | ICD-10-CM

## 2014-07-09 NOTE — Patient Instructions (Addendum)
  Endocarditis is a potentially serious infection of heart valves or inside lining of the heart.  It occurs more commonly in patients with diseased heart valves (such as patient's with aortic or mitral valve disease) and in patients who have undergone heart valve repair or replacement.  Certain surgical and dental procedures may put you at risk, such as dental cleaning, other dental procedures, or any surgery involving the respiratory, urinary, gastrointestinal tract, gallbladder or prostate gland.   To minimize your chances for develooping endocarditis, maintain good oral health and seek prompt medical attention for any infections involving the mouth, teeth, gums, skin or urinary tract.  Always notify your doctor or dentist about your underlying heart valve condition before having any invasive procedures. You will need to take antibiotics before certain procedures.    Patient may resume unrestricted physical activity without any particular limitations at this time.  The patient is encouraged to enroll and participate in the outpatient cardiac rehab program beginning as soon as practical.

## 2014-07-09 NOTE — Progress Notes (Signed)
[name]             [phone]     CARDIOTHORACIC SURGERY OFFICE NOTE  Referring Provider is Dorothy Spark, MD PCP is Gilford Rile, MD   HPI:  Patient returns for routine follow-up and wound check approximately 2 months status post minimally invasive mitral valve replacement using a bileaflet mechanical valve and Maze procedure on 05/15/2014 for severe rheumatic mitral valve stenosis with recurrent paroxysmal atrial fibrillation.  Postoperatively the patient developed complete heart block requiring permanent pacemaker placement by Dr. Rayann Heman on 05/22/2014.  He was ultimately discharged home on 05/26/2014.  He later developed superficial breakdown of his right groin incision without evidence for wound infection. This has been managed with local wound care by the patient's wife.  He was last seen here in our office on 06/18/2014.  Routine follow-up transthoracic echocardiogram performed 06/27/2014 revealed a normal functioning mechanical mitral valve with ejection fraction estimated 45-50%.  The patient returns for routine follow up today.  Overall he states that he is feeling well and improving.  He plans to start cardiac rehabilitation program later this week.  He still has some exertional shortness of breath, but overall this continues to gradually improve. He has mild residual soreness in his chest but he has not taking any sort of oral narcotic pain relievers. He has not had any palpitations or dizzy spells. Appetite is good.  Most recent INR was reported 3.6 on 06/27/2014. His Coumadin dose has been adjusted and he is scheduled for a follow-up INR later this week.   Current Outpatient Prescriptions  Medication Sig Dispense Refill  . acetaminophen (TYLENOL) 325 MG tablet Take 325 mg by mouth every 6 (six) hours as needed (pain).     Marland Kitchen ALPRAZolam (XANAX) 0.5 MG tablet Take 0.5-1 mg by mouth at bedtime as needed for anxiety.     Marland Kitchen  aspirin EC 81 MG tablet Take 1 tablet (81 mg total) by mouth daily.    Marland Kitchen levothyroxine (SYNTHROID) 75 MCG tablet Take 1 tablet (75 mcg total) by mouth daily before breakfast. 90 tablet 3  . metoprolol tartrate (LOPRESSOR) 25 MG tablet Take 1 tablet (25 mg total) by mouth 2 (two) times daily. 60 tablet 3  . nystatin-triamcinolone (MYCOLOG II) cream APPLY TO AFFECTED AREA TWICE A DAY 30 g 0  . sertraline (ZOLOFT) 50 MG tablet Take 1 tablet (50 mg total) by mouth daily. 90 tablet 4  . warfarin (COUMADIN) 5 MG tablet Take as directed by coumadin clinic 90 tablet 0   No current facility-administered medications for this visit.      Physical Exam:   BP 139/83 mmHg  Pulse 71  Resp 16  Ht 5\' 8"  (1.727 m)  Wt 184 lb (83.462 kg)  BMI 27.98 kg/m2  SpO2 98%  General:  Well-appearing  Chest:   Clear  CV:   Regular rate and rhythm with mechanical heart sounds  Incisions:  Mini thoracotomy incision is healed nicely, right groin incision has almost completely healed with very small shallow area of healthy granulation tissue  Abdomen:  Soft and nontender  Extremities:  Warm and well-perfused  Diagnostic Tests:  Transthoracic Echocardiography  Patient:  Kevin, English MR #:    [id number] Study Date: 06/27/2014 Gender:   M Age:    59 Height:   172.7 cm Weight:   85.3 kg BSA:    2.04 m^2 Pt. Status:  Room:  SONOGRAPHER Victorio Palm, RDCS Neva Seat T ATTENDING  Candee Furbish, M.D. REFERRING  Ena Dawley, M.D. PERFORMING  [facility], Outpatient  cc:  ------------------------------------------------------------------- LV EF: 45% -  50%  ------------------------------------------------------------------- Indications:   Mitral valve disease (I05.0).  ------------------------------------------------------------------- History:  PMH: Acquired from the patient and from the patient&'s chart. Atrial  fibrillation. Coronary artery disease. Congestive heart failure, with by echocardiography. The dysfunction is primarily diastolic. Stroke. Rheumatic fever. The patient has a GU malignancy. Risk factors: Former tobacco use. Hypertension. Dyslipidemia.  ------------------------------------------------------------------- Study Conclusions  - Left ventricle: The cavity size was normal. Wall thickness was increased in a pattern of mild LVH. Systolic function was mildly reduced. The estimated ejection fraction was in the range of 45% to 50%. There is dyskinesis of the apical myocardium. - Aortic valve: There was trivial regurgitation. - Mitral valve: A mechanical prosthesis was present and functioning normally. Valve area by continuity equation (using LVOT flow): 1.78 cm^2. - Left atrium: The atrium was severely dilated.  ------------------------------------------------------------------- Labs, prior tests, procedures, and surgery: Transesophageal echocardiography (May 2015).  Valve surgery.  Mini-thoracotomy mitral valve replacement with a mechanical valve. Maze procedure. Right nephrectomy. Transthoracic echocardiography. M-mode, complete 2D, spectral Doppler, and color Doppler. Birthdate: Patient birthdate: [birth year]. Age: Patient is 59 yr old. Sex: Gender: male. BMI: 28.6 kg/m^2. Blood pressure:   138/67 Patient status: Outpatient. Study date: Study date: 06/27/2014. Study time: 11:50 AM. Location: [facility]  -------------------------------------------------------------------  ------------------------------------------------------------------- Left ventricle: The cavity size was normal. Wall thickness was increased in a pattern of mild LVH. Systolic function was mildly reduced. The estimated ejection fraction was in the range of 45% to 50%. Regional wall motion abnormalities:  There is dyskinesis of the apical  myocardium.  ------------------------------------------------------------------- Aortic valve:  Trileaflet; normal thickness, mildly calcified leaflets. Mobility was not restricted. Doppler: Transvalvular velocity was within the normal range. There was no stenosis. There was trivial regurgitation.  ------------------------------------------------------------------- Aorta: Aortic root: The aortic root was normal in size.  ------------------------------------------------------------------- Mitral valve: A mechanical prosthesis was present and functioning normally. Doppler:   Valve area by pressure half-time: 3.28 cm^2. Indexed valve area by pressure half-time: 1.61 cm^2/m^2. Valve area by continuity equation (using LVOT flow): 1.78 cm^2. Indexed valve area by continuity equation (using LVOT flow): 0.87 cm^2/m^2.  Mean gradient (D): 4 mm Hg. Peak gradient (D): 14 mm Hg.  ------------------------------------------------------------------- Left atrium: The atrium was severely dilated.  ------------------------------------------------------------------- Right ventricle: The cavity size was normal. Wall thickness was normal. Pacer wire or catheter noted in right ventricle. Systolic function was normal.  ------------------------------------------------------------------- Pulmonic valve:  Poorly visualized. Structurally normal valve. Cusp separation was normal. Doppler: Transvalvular velocity was within the normal range. There was no evidence for stenosis. There was no regurgitation.  ------------------------------------------------------------------- Tricuspid valve:  Structurally normal valve.  Doppler: Transvalvular velocity was within the normal range. There was trivial regurgitation.  ------------------------------------------------------------------- Pulmonary artery:  The main pulmonary artery was normal-sized. Systolic pressure was within the normal  range.  ------------------------------------------------------------------- Right atrium: The atrium was normal in size.  ------------------------------------------------------------------- Pericardium: There was no pericardial effusion.  ------------------------------------------------------------------- Systemic veins: Inferior vena cava: The vessel was normal in size.  ------------------------------------------------------------------- Measurements  Left ventricle              Value     Reference LV ID, ED, PLAX chordal          47.9 mm    43 - 52 LV ID,  ES, PLAX chordal          24.3 mm    23 - 38 LV fx shortening, PLAX chordal      49  %    >=29 LV PW thickness, ED            13.2 mm    --------- IVS/LV PW ratio, ED            0.79      <=1.3 Stroke volume, 2D             59  ml    --------- Stroke volume/bsa, 2D           29  ml/m^2  ---------  Ventricular septum            Value     Reference IVS thickness, ED             10.4 mm    ---------  LVOT                   Value     Reference LVOT ID, S                20  mm    --------- LVOT area                 3.14 cm^2   --------- LVOT ID                  20  mm    --------- LVOT peak velocity, S           109  cm/s   --------- LVOT mean velocity, S           71.3 cm/s   --------- LVOT VTI, S                18.7 cm    --------- Stroke volume (SV), LVOT DP        58.7 ml    --------- Stroke index (SV/bsa), LVOT DP      28.8 ml/m^2  ---------  Aortic valve               Value     Reference Aortic regurg pressure half-time     707  ms    ---------  Aorta                    Value     Reference Aortic root ID, ED            29  mm    ---------  Left atrium                Value     Reference LA ID, A-P, ES              57  mm    --------- LA ID/bsa, A-P          (H)   2.79 cm/m^2  <=2.2 LA volume, S               78  ml    --------- LA volume/bsa, S             38.2 ml/m^2  --------- LA volume, ES, 1-p A4C          73  ml    --------- LA volume/bsa, ES, 1-p A4C        35.7 ml/m^2  --------- LA volume, ES, 1-p A2C  78  ml    --------- LA volume/bsa, ES, 1-p A2C        38.2 ml/m^2  ---------  Mitral valve               Value     Reference Mitral E-wave peak velocity        161  cm/s   --------- Mitral A-wave peak velocity        80.2 cm/s   --------- Mitral mean velocity, D          80.4 cm/s   --------- Mitral deceleration time         208  ms    150 - 230 Mitral pressure half-time         61  ms    --------- Mitral mean gradient, D          4   mm Hg  --------- Mitral peak gradient, D          14  mm Hg  --------- Mitral E/A ratio, peak          2       --------- Mitral valve area, PHT, DP        3.28 cm^2   --------- Mitral valve area/bsa, PHT, DP      1.61 cm^2/m^2 --------- Mitral valve area, LVOT          1.78 cm^2   --------- continuity Mitral valve area/bsa, LVOT        0.87 cm^2/m^2 --------- continuity Mitral annulus VTI, D           33  cm    ---------  Pulmonary arteries            Value     Reference PA pressure, S, DP            27  mm Hg  <=30  Tricuspid valve              Value     Reference Tricuspid  regurg peak velocity      243  cm/s   --------- Tricuspid peak RV-RA gradient       24  mm Hg  ---------  Systemic veins              Value     Reference Estimated CVP               3   mm Hg  ---------  Right ventricle              Value     Reference RV pressure, S, DP            27  mm Hg  <=30 RV s&', lateral, S             9.32 cm/s   ---------  Legend: (L) and (H) mark values outside specified reference range.  ------------------------------------------------------------------- Prepared and Electronically Authenticated by  Candee Furbish, M.D. 2016-04-06T16:15:48    2 channel telemetry rhythm strip performed in the office today demonstrates sinus rhythm with appropriate ventricular pacing   CHEST 2 VIEW  COMPARISON: 06/11/2014 and 05/25/2014 and chest CT dated 04/10/2014  FINDINGS: The pulmonary edema has completely resolved. Right pleural effusion has resolved. Heart size and vascularity are now normal. Minimal thickening of the minor fissure on the right. There is slight linear atelectasis in the right lung apex.  Mitral valve prosthesis and the left atrial appendage clip noted.  IMPRESSION: Resolution of pulmonary edema and right  effusion. Slight residual peribronchial thickening and atelectasis.   Electronically Signed  By: [name] M.D.  On: 07/09/2014 14:55   Impression:  Patient is doing well approximately 2 months status post minimally invasive mitral valve replacement using a bileaflet mechanical prosthesis and Maze procedure.  Postoperatively the patient developed complete heart block requiring permanent pacemaker placement. He has otherwise done well and he continues to maintain sinus rhythm. The superficial skin separation of the patient's right groin incision has now almost completely healed.    Plan:  I have  encouraged the patient to get started in the outpatient cardiac rehabilitation program. At this point he may return to unrestricted physical activity with his only limitations being those related to the presence of his permanent pacemaker and long-term anticoagulation using warfarin.  We have not made any recommendations for changes to his current medications at this time.  The patient has been reminded regarding the lifelong need for antibiotic prophylaxis for all dental cleaning and related procedures. He will return to our office for routine follow-up and rhythm check in 4 months. He will ultimately be referred to the atrial fibrillation clinic for routine surveillance following maze procedure.  He has been reminded to keep his previously scheduled follow-up appointment with Dr. [name].  All of his questions have been addressed.     Valentina Gu. Roxy Manns, MD 07/09/2014 4:23 PM

## 2014-07-11 ENCOUNTER — Ambulatory Visit (INDEPENDENT_AMBULATORY_CARE_PROVIDER_SITE_OTHER): Payer: Managed Care, Other (non HMO) | Admitting: Cardiology

## 2014-07-11 ENCOUNTER — Ambulatory Visit (INDEPENDENT_AMBULATORY_CARE_PROVIDER_SITE_OTHER): Payer: Managed Care, Other (non HMO) | Admitting: *Deleted

## 2014-07-11 ENCOUNTER — Encounter: Payer: Self-pay | Admitting: Cardiology

## 2014-07-11 VITALS — BP 130/78 | HR 71 | Ht 68.0 in | Wt 183.8 lb

## 2014-07-11 DIAGNOSIS — Z954 Presence of other heart-valve replacement: Secondary | ICD-10-CM

## 2014-07-11 DIAGNOSIS — I442 Atrioventricular block, complete: Secondary | ICD-10-CM

## 2014-07-11 DIAGNOSIS — I2510 Atherosclerotic heart disease of native coronary artery without angina pectoris: Secondary | ICD-10-CM

## 2014-07-11 DIAGNOSIS — Z5181 Encounter for therapeutic drug level monitoring: Secondary | ICD-10-CM | POA: Diagnosis not present

## 2014-07-11 DIAGNOSIS — I050 Rheumatic mitral stenosis: Secondary | ICD-10-CM

## 2014-07-11 DIAGNOSIS — I4891 Unspecified atrial fibrillation: Secondary | ICD-10-CM

## 2014-07-11 DIAGNOSIS — I5033 Acute on chronic diastolic (congestive) heart failure: Secondary | ICD-10-CM

## 2014-07-11 DIAGNOSIS — R0609 Other forms of dyspnea: Secondary | ICD-10-CM | POA: Diagnosis not present

## 2014-07-11 DIAGNOSIS — I480 Paroxysmal atrial fibrillation: Secondary | ICD-10-CM

## 2014-07-11 DIAGNOSIS — I2583 Coronary atherosclerosis due to lipid rich plaque: Secondary | ICD-10-CM

## 2014-07-11 DIAGNOSIS — I10 Essential (primary) hypertension: Secondary | ICD-10-CM

## 2014-07-11 DIAGNOSIS — Z950 Presence of cardiac pacemaker: Secondary | ICD-10-CM

## 2014-07-11 LAB — COMPREHENSIVE METABOLIC PANEL
ALT: 17 U/L (ref 0–53)
AST: 15 U/L (ref 0–37)
Albumin: 4.1 g/dL (ref 3.5–5.2)
Alkaline Phosphatase: 106 U/L (ref 39–117)
BUN: 17 mg/dL (ref 6–23)
CO2: 27 mEq/L (ref 19–32)
Calcium: 10.4 mg/dL (ref 8.4–10.5)
Chloride: 107 mEq/L (ref 96–112)
Creatinine, Ser: 1.81 mg/dL — ABNORMAL HIGH (ref 0.40–1.50)
GFR: 40.69 mL/min — ABNORMAL LOW (ref >60.00)
Glucose, Bld: 87 mg/dL (ref 70–99)
Potassium: 4.2 mEq/L (ref 3.5–5.1)
Sodium: 139 mEq/L (ref 135–145)
Total Bilirubin: 0.4 mg/dL (ref 0.2–1.2)
Total Protein: 7.4 g/dL (ref 6.0–8.3)

## 2014-07-11 LAB — BRAIN NATRIURETIC PEPTIDE: Pro B Natriuretic peptide (BNP): 54 pg/mL (ref 0.0–100.0)

## 2014-07-11 LAB — POCT INR: INR: 2.3

## 2014-07-11 NOTE — Patient Instructions (Signed)
Medication Instructions:   Your physician recommends that you continue on your current medications as directed. Please refer to the Current Medication list given to you today.    Labwork:  TODAY--BNP, CMET,     Testing/Procedures:  Your physician has requested that you have an echocardiogram. Echocardiography is a painless test that uses sound waves to create images of your heart. It provides your doctor with information about the size and shape of your heart and how well your heart's chambers and valves are working. This procedure takes approximately one hour. There are no restrictions for this procedure.  HAVE THIS SCHEDULED PRIOR TO YOUR 3 MONTH FOLLOW-UP APPOINTMENT WITH DR [name]     Follow-Up:  Your physician recommends that you schedule a follow-up appointment in: 3 MONTHS WITH DR NELSON---PLEASE HAVE YOUR ECHO PRIOR TO THIS APPOINTMENT

## 2014-07-11 NOTE — Progress Notes (Signed)
Patient ID: Kevin English, male   DOB: July 07, 1955, 59 y.o.   MRN: [id number]    Cardiology Office Note   Date:  07/11/2014   ID:  Kevin English, DOB May 15, 1955, MRN [id number]  PCP:  Gilford Rile, MD  Cardiologist:  Dr. Ena Dawley   Electrophysiologist:  Dr. Thompson Grayer   Chief Complaint  Patient presents with  . Appointment    4 week f/u     History of Present Illness: Kevin English is a 59 y.o. male with a hx of HTN, HL (intol to statins), former smoker, PAFib, Rheumatic heart disease with severe mitral stenosis, diastolic HF, depression, CKD, renal cell CA, OSA.  He has been on Coumadin.  He had been prepared for minimally invasive MVR and placed on Amiodarone.  However, he developed clear cell renal cell CA and underwent R nephrectomy.  He developed fatigue with beta-blocker and Bystolic was DC'd.    He was ultimately stabilized and admitted 2/23-3/5.  He underwent Minimally-Invasive Mitral Valve Replacement (Sorin Carbomedics Optiform bileaflet mechanical valve) and Maze Procedure with Left atrial lesion set using cryothermy and Clipping of Left Atrial Appendage with Dr. Roxy Manns.  Post op course was complicated by complete heart block.  He ultimately required implantation of a Medtronic Adapta L dual-chamber pacemaker.  He also developed HCAP and required IV antibiotics, a/c diastolic HF requiring diuresis.  He returns for FU.  The patient tells me that he feels "lousy." He is overall frustrated about his health, recent surgeries and need for pacemaker. He has dehiscence of his right groin wound that is being packed. His wife handles dressing changes. She tells me that it is improving. Overall, he feels that he is better. He is not walking that much. He feels short of breath at times. When I ask him further about this, he doesn't really think his breathing is any worse. His chest remains sore. This is improved. He describes NYHA 2b symptoms. He denies orthopnea, PND or edema. He wears  CPAP nightly. He had diarrhea in the hospital. C. difficile was negative. This is resolved. He denies fever, cough, wheezing. He denies melena or hematochezia. He denies vomiting or diarrhea. He tells me that his tolerance for just about anything is poor. His wife tells me that he gets frustrated easily. He is currently on Zoloft and Xanax.  07/11/14 - the patient is coming after 1 months, he is in better spirits, starting to feel significantly better and is depression has improved significantly as well. He has been evaluated for the cardiac rehab at the [facility] and defer start time until his groin wound would heal. It has healed now and he is ready to start the next week. He denies any palpitations, syncope, CP, he has improving DOE. He is complaint with his meds and has no bleeding with warfarin, follows in our clinic. No orthopnea, PND, or LE edema.   Studies/Reports Reviewed Today:  Dg Chest 2 View  06/11/2014    IMPRESSION: 1. Almost complete resolution of interstitial edema and accentuation of the interstitial markings. 2.  Resolution of small left effusion. 3. Small residual right effusion.   Electronically Signed   By: [name] M.D.   On: 06/11/2014 13:01    Carotid US 04/16/14 Bilateral - 1% to 39% ICA stensosis.  TEE 11/23/81 - Systolic function was normal. Wall motion was normal - Aortic valve: There was mild regurgitation. - Mitral valve: Valve area by pressure half-time: 1.4 cm^2. - Left atrium: The  atrium was moderately dilated.  Impressions:  Rheumatic mitral valve with moderate leaflet tip thickening and minimal calcifications. Moderate mitral stenosis and trace mitral regurgitation.  Cardiac cath 08/28/13 LM:  Normal LAD:  Proximal 40-50%, distal 50-60% LCx:  Small OM 70-80% (not amenable to PCI or CABG), PDA 70% LPDA:  10-20% RCA:  Mid 30-40%   Past Medical History  Diagnosis Date  . Hernia   . Chronic rhinitis   . Diverticulitis of colon 15 years ago  .  Hyperlipidemia   . Hypogonadism male   . Obesity   . Hypothyroidism   . Persistent atrial fibrillation     a. sustained 160-180 on e-CARDIO monitor placed 07/29/2013. Placed on IV amiodarone at end of May 2015 due to continued paroxysms with RVR.  Marland Kitchen Rheumatic heart disease   . Mitral stenosis     a. Dx 07/2013 - through workup of 2D echo, TEE, and cath, felt to be moderate.  . Paroxysmal atrial flutter   . CAD (coronary artery disease)     a. By cath 08/2013: 70% distal cx-prox LPDA; tandem mod LAD lesions, o/w mild dz.  . Renal mass     a. Dx 08/2013: concerning for renal cancer.  Marland Kitchen HTN (hypertension)     hx of- under control  . Stroke 1998    "MRI showed 3 mild strokes"  . Borderline diabetes   . Depression   . Anxiety   . Dizzy spells   . Deafness in left ear   . Lung nodule seen on imaging study 04/10/2014    5 mm nodule right lower lobe  . OSA on CPAP     cpap  . CKD (chronic kidney disease), stage II     his creat. runs 2-2.5; hasnt seen neph yet - sees dr. Tresa Moore at [facility]  . Renal cell carcinoma of right kidney 09/05/2013    clear cell renal cell carcinoma of right kidney treated by radical nephrectomy, Fuhrman grade 2-3, with maximum tumor diameter 2.7 cm, all tumor to find confined to the kidney, and all surgical margins negative ([id number]).   . Yeast dermatitis 04/19/2014  . History of shingles   . S/P minimally invasive mitral valve replacement with metallic valve and maze procedure 05/15/2014    68mm Sorin Carbomedics Optiform mechanical valve placed via right mini thoracotomy approach  . S/P Minimally invasive maze operation for atrial fibrillation 05/15/2014    Left side lesion set using cryothermy with clipping of LA appendage  . Pacemaker 05/22/2014    for complete heart block  . Hx of echocardiogram     post MVR >> Echo 4/16:  Mild LVH, EF 45-50%, apical dyskinesis, trivial AI, mechanical MVR functioning appropriately, severe LAE    Past Surgical History  Procedure  Laterality Date  . Tee without cardioversion N/A 08/16/2013    Procedure: TRANSESOPHAGEAL ECHOCARDIOGRAM (TEE);  Surgeon: Dorothy Spark, MD;  Location: [place];  Service: Cardiovascular;  Laterality: N/A;  . Inguinal hernia repair Bilateral first one in 80's    "I had one side done twice"  . Cardiac catheterization  08/28/13  . Eye surgery Right 1990    "reconstructive, lens implant- from paintball accident"  . Robot assisted laparoscopic nephrectomy Right 09/05/2013    Procedure: ROBOTIC ASSISTED LAPAROSCOPIC RIGHT RADICAL NEPHRECTOMY;  Surgeon: Sharyn Creamer, MD;  Location: [facility];  Service: Urology;  Laterality: Right;  . Left and right heart catheterization with coronary angiogram N/A 08/28/2013    Procedure: LEFT AND RIGHT  HEART CATHETERIZATION WITH CORONARY ANGIOGRAM;  Surgeon: Leonie Man, MD;  Location: [facility] CATH LAB;  Service: Cardiovascular;  Laterality: N/A;  . Mitral valve replacement Right 05/15/2014    Procedure: MINIMALLY INVASIVE MITRAL VALVE (MV) REPLACEMENT;  Surgeon: Rexene Alberts, MD;  Location: [place];  Service: Open Heart Surgery;  Laterality: Right;  . Minimally invasive maze procedure N/A 05/15/2014    Procedure: MINIMALLY INVASIVE MAZE PROCEDURE;  Surgeon: Rexene Alberts, MD;  Location: [place];  Service: Open Heart Surgery;  Laterality: N/A;  . Tee without cardioversion N/A 05/15/2014    Procedure: TRANSESOPHAGEAL ECHOCARDIOGRAM (TEE);  Surgeon: Rexene Alberts, MD;  Location: [place];  Service: Open Heart Surgery;  Laterality: N/A;  . Permanent pacemaker insertion Left 05/22/2014    Procedure: PERMANENT PACEMAKER INSERTION;  Surgeon: Thompson Grayer, MD;  Location: [facility] CATH LAB;  Service: Cardiovascular;  Laterality: Left;     Current Outpatient Prescriptions  Medication Sig Dispense Refill  . acetaminophen (TYLENOL) 325 MG tablet Take 325 mg by mouth every 6 (six) hours as needed (pain).     Marland Kitchen ALPRAZolam (XANAX) 0.5 MG tablet Take 0.5-1 mg by mouth at bedtime as  needed for anxiety.     Marland Kitchen aspirin EC 81 MG tablet Take 1 tablet (81 mg total) by mouth daily.    Marland Kitchen levothyroxine (SYNTHROID) 75 MCG tablet Take 1 tablet (75 mcg total) by mouth daily before breakfast. 90 tablet 3  . metoprolol tartrate (LOPRESSOR) 25 MG tablet Take 1 tablet (25 mg total) by mouth 2 (two) times daily. 60 tablet 3  . nystatin-triamcinolone (MYCOLOG II) cream APPLY TO AFFECTED AREA TWICE A DAY 30 g 0  . sertraline (ZOLOFT) 50 MG tablet Take 1 tablet (50 mg total) by mouth daily. 90 tablet 4  . warfarin (COUMADIN) 5 MG tablet Take as directed by coumadin clinic 90 tablet 0   No current facility-administered medications for this visit.    Allergies:   Contrast media; Atorvastatin; Fenofibrate; and Pravastatin    Social History:  The patient  reports that he quit smoking about 8 weeks ago. His smoking use included Cigarettes. He has a 10 pack-year smoking history. He has never used smokeless tobacco. He reports that he does not drink alcohol or use illicit drugs.   Family History:  The patient's family history includes Heart attack in his mother; Hypertension in his mother and another family member; Stroke in his mother.    ROS:   Please see the history of present illness.   Review of Systems  Cardiovascular: Positive for dyspnea on exertion.  All other systems reviewed and are negative.    PHYSICAL EXAM: VS:  BP 130/78 mmHg  Pulse 71  Ht 5\' 8"  (1.727 m)  Wt 183 lb 12.8 oz (83.371 kg)  BMI 27.95 kg/m2    Wt Readings from Last 3 Encounters:  07/11/14 183 lb 12.8 oz (83.371 kg)  07/09/14 184 lb (83.462 kg)  06/18/14 188 lb (85.276 kg)     GEN: Well nourished, well developed, in no acute distress HEENT: normal Neck: no JVD, no masses Cardiac:  Mechanical S1/Normal S2, RRR; no murmur ,  no rubs or gallops, no edema  Chest: Right chest incision well healed without erythema or discharge Respiratory:  Decreased breath sounds bilaterally, no wheezing, rhonchi or  rales. GI: soft, nontender, nondistended, + BS MS: no deformity or atrophy Skin: warm and dry  Neuro:  CNs II-XII intact, Strength and sensation are intact Psych: Normal affect  EKG:  EKG is ordered today.  It demonstrates:   NSR, HR 64, V paced   Recent Labs: 08/10/2013: Pro B Natriuretic peptide (BNP) 1467.0* 01/15/2014: TSH 0.63 05/16/2014: Magnesium 2.7* 05/19/2014: B Natriuretic Peptide 309.5* 05/21/2014: ALT 18 05/26/2014: BUN 20; Creatinine 1.56*; Hemoglobin 13.5; Platelets 392; Potassium 3.8; Sodium 136    Lipid Panel    Component Value Date/Time   CHOL 280* 01/15/2014 0801   TRIG 572.0* 01/15/2014 0801   HDL 23.20* 01/15/2014 0801   CHOLHDL 12 01/15/2014 0801   VLDL 114.4* 01/15/2014 0801   LDLDIRECT 148.2 01/15/2014 0801      ASSESSMENT AND PLAN:  Mitral stenosis S/P minimally invasive mitral valve replacement with metallic valve and maze procedure  -  I have encouraged him gaian to go to cardiac rehabilitation.   -  SBE prophylaxis card was given.   -  The last echo showed normally functioning mitral valve.  -  Coumadin managed in our Coumadin clinic.  LV dysfunction - LVEF 45-50%, the patient has only 1 kidney and the last Crea 1.7 --> 1.55, we will recheck today and if improved start ACEI or ARB.  Paroxysmal atrial fibrillation Maintaining NSR. Continue beta blocker, Coumadin.  Essential hypertension Blood pressure controlled on current regimen.  Hyperlipidemia He is intolerant to statins. Over time, we could consider sending him to the lipid clinic. Given his current mental status, I would hold off on this for now.  Chronic diastolic CHF (congestive heart failure) Volume appears stable. He is not on diuretics.  Coronary artery disease involving native coronary artery of native heart without angina pectoris No apparent angina. If he continues to have issues with shortness of breath, we could try to initiate nitrates. He did have some moderate severe  disease in a small OM branch that was not amenable to PCI. Continue aspirin, beta blocker.  CHB (complete heart block) S/P placement of cardiac pacemaker Follow up with EP as planned.  Sleep apnea Continue CPAP.  Renal cell carcinoma of right kidney-nephrectomy June 2015 Follow-up with urology as planned  CKD (chronic kidney disease), stage II BMET recently checked by primary care. I will request those records.  Depression  I have encouraged him to discuss this with his primary care physician. Continue current medical therapy. Consider counseling.  Current medicines are reviewed at length with the patient today.  The patient does not have concerns regarding medicines.  The following changes have been made:  no change  Labs/ tests ordered today include:   No orders of the defined types were placed in this encounter.    Disposition:   FU with Dr. [name] 4 weeks.  Signed, Versie Starks, MHS 07/11/2014 9:54 AM    [facility] [address], [place], [place]  [address] Phone: [phone]; Fax: [phone]

## 2014-07-25 ENCOUNTER — Ambulatory Visit (INDEPENDENT_AMBULATORY_CARE_PROVIDER_SITE_OTHER): Payer: Managed Care, Other (non HMO) | Admitting: *Deleted

## 2014-07-25 DIAGNOSIS — I4891 Unspecified atrial fibrillation: Secondary | ICD-10-CM

## 2014-07-25 DIAGNOSIS — I5033 Acute on chronic diastolic (congestive) heart failure: Secondary | ICD-10-CM

## 2014-07-25 DIAGNOSIS — Z954 Presence of other heart-valve replacement: Secondary | ICD-10-CM

## 2014-07-25 DIAGNOSIS — I509 Heart failure, unspecified: Secondary | ICD-10-CM

## 2014-07-25 DIAGNOSIS — Z5181 Encounter for therapeutic drug level monitoring: Secondary | ICD-10-CM | POA: Diagnosis not present

## 2014-07-25 DIAGNOSIS — I050 Rheumatic mitral stenosis: Secondary | ICD-10-CM

## 2014-07-25 LAB — POCT INR: INR: 2.8

## 2014-07-26 ENCOUNTER — Telehealth: Payer: Self-pay | Admitting: Cardiology

## 2014-07-26 NOTE — Telephone Encounter (Signed)
New Message  Pt wife calling after pt went to cardiac rehab at [facility]- Concerned about cholesterol- can not take normal meds for cholesterol.  Was told about referral for [facility]. Please call back and discuss.

## 2014-07-26 NOTE — Telephone Encounter (Signed)
Called patient's wife back about patient's cholesterol, after consulting Dr. [name]. A referral to Elberta Leatherwood the pharmacist, for the lipid clinic was advised and will send message to [facility] for scheduling.

## 2014-08-16 ENCOUNTER — Telehealth: Payer: Self-pay | Admitting: Cardiology

## 2014-08-16 MED ORDER — METOPROLOL TARTRATE 50 MG PO TABS: 50.0000 mg | ORAL_TABLET | Freq: Two times a day (BID) | ORAL | Status: DC

## 2014-08-16 NOTE — Telephone Encounter (Signed)
Informed both the pt and wife that per Dr [name] she would like to increase his Metoprolol tartrate to 50 mg po bid.  Confirmed the pharmacy of choice with the pt and spouse.  Advised to log BP readings and send them to [place] via mychart.  Both verbalized understanding and agrees with this plan.

## 2014-08-16 NOTE — Telephone Encounter (Signed)
New Message    Pt c/o BP issue: STAT if pt c/o blurred vision, one-sided weakness or slurred speech  1. What are your last 5 BP readings? 160/121, 158/101, 160/107  2. Are you having any other symptoms (ex. Dizziness, headache, blurred vision, passed out)? Dizzy spell late yesterday evening,  Patient is feeling better but would like to speak with nurse   3. What is your BP issue? To high

## 2014-08-16 NOTE — Telephone Encounter (Signed)
Spoke with the pt and wife and both confirmed that last night and this morning the pt has been hypertensive with BP running in the 160s/106.  Wife states the pt is being compliant with taking his meds.  Wife states they took his BP with an automatic cuff and manually.  Wife states the pt is asymptomatic with no cardiac or neurological complaints at all.  Informed the wife I will go and speak with Dr [name] in regards to pts readings and follow-up with them both on her recommendations.  Both verbalized understanding and agree with this plan.

## 2014-08-17 ENCOUNTER — Encounter: Payer: Self-pay | Admitting: Cardiology

## 2014-08-20 ENCOUNTER — Encounter: Payer: Self-pay | Admitting: Cardiology

## 2014-08-21 ENCOUNTER — Other Ambulatory Visit: Payer: Managed Care, Other (non HMO)

## 2014-08-21 MED ORDER — AMLODIPINE BESYLATE 2.5 MG PO TABS: 2.5000 mg | ORAL_TABLET | Freq: Every day | ORAL | Status: DC

## 2014-08-21 MED ORDER — METOPROLOL TARTRATE 25 MG PO TABS: 25.0000 mg | ORAL_TABLET | Freq: Two times a day (BID) | ORAL | Status: DC

## 2014-08-21 NOTE — Telephone Encounter (Signed)
Contacted the pt and his spouse to inform them that according to recent mychart message sent to Dr [name] to review for pts complaints of fatigue and dizziness, she now recommends that the pt to go back to his original dose of metoprolol tartrate 25 mg po bid and add amlodipine 2.5 mg po daily instead. Confirmed the pharmacy of choice with the pt and spouse.  Sent only a month supply into the pharmacy as requested by both parties, to make sure this regimen will work.  Encouraged the pt and spouse to maintain good hydration and continue with cardiac rehab.  Encouraged both parties to continue monitoring the pts BP and logging this information.  Both verbalized understanding and agrees with this plan.

## 2014-08-22 ENCOUNTER — Ambulatory Visit (INDEPENDENT_AMBULATORY_CARE_PROVIDER_SITE_OTHER): Payer: Managed Care, Other (non HMO) | Admitting: Internal Medicine

## 2014-08-22 ENCOUNTER — Ambulatory Visit: Payer: Managed Care, Other (non HMO) | Admitting: Pharmacist

## 2014-08-22 ENCOUNTER — Encounter: Payer: Self-pay | Admitting: Internal Medicine

## 2014-08-22 ENCOUNTER — Other Ambulatory Visit: Payer: Self-pay

## 2014-08-22 ENCOUNTER — Other Ambulatory Visit (INDEPENDENT_AMBULATORY_CARE_PROVIDER_SITE_OTHER): Payer: Managed Care, Other (non HMO) | Admitting: *Deleted

## 2014-08-22 ENCOUNTER — Telehealth: Payer: Self-pay | Admitting: *Deleted

## 2014-08-22 ENCOUNTER — Ambulatory Visit (INDEPENDENT_AMBULATORY_CARE_PROVIDER_SITE_OTHER): Payer: Managed Care, Other (non HMO) | Admitting: *Deleted

## 2014-08-22 VITALS — BP 114/96 | HR 65 | Ht 68.0 in | Wt 185.4 lb

## 2014-08-22 DIAGNOSIS — R7989 Other specified abnormal findings of blood chemistry: Secondary | ICD-10-CM | POA: Diagnosis not present

## 2014-08-22 DIAGNOSIS — R5383 Other fatigue: Secondary | ICD-10-CM

## 2014-08-22 DIAGNOSIS — I442 Atrioventricular block, complete: Secondary | ICD-10-CM

## 2014-08-22 DIAGNOSIS — H547 Unspecified visual loss: Secondary | ICD-10-CM

## 2014-08-22 DIAGNOSIS — I480 Paroxysmal atrial fibrillation: Secondary | ICD-10-CM | POA: Diagnosis not present

## 2014-08-22 DIAGNOSIS — Z8679 Personal history of other diseases of the circulatory system: Secondary | ICD-10-CM

## 2014-08-22 DIAGNOSIS — Z5181 Encounter for therapeutic drug level monitoring: Secondary | ICD-10-CM

## 2014-08-22 DIAGNOSIS — E038 Other specified hypothyroidism: Secondary | ICD-10-CM | POA: Diagnosis not present

## 2014-08-22 DIAGNOSIS — Z9889 Other specified postprocedural states: Secondary | ICD-10-CM

## 2014-08-22 DIAGNOSIS — I4891 Unspecified atrial fibrillation: Secondary | ICD-10-CM | POA: Diagnosis not present

## 2014-08-22 DIAGNOSIS — E785 Hyperlipidemia, unspecified: Secondary | ICD-10-CM

## 2014-08-22 DIAGNOSIS — I10 Essential (primary) hypertension: Secondary | ICD-10-CM

## 2014-08-22 DIAGNOSIS — Z954 Presence of other heart-valve replacement: Secondary | ICD-10-CM

## 2014-08-22 DIAGNOSIS — Z950 Presence of cardiac pacemaker: Secondary | ICD-10-CM

## 2014-08-22 DIAGNOSIS — I050 Rheumatic mitral stenosis: Secondary | ICD-10-CM

## 2014-08-22 DIAGNOSIS — R0609 Other forms of dyspnea: Secondary | ICD-10-CM

## 2014-08-22 DIAGNOSIS — I5033 Acute on chronic diastolic (congestive) heart failure: Secondary | ICD-10-CM

## 2014-08-22 LAB — LDL CHOLESTEROL, DIRECT: Direct LDL: 84 mg/dL

## 2014-08-22 LAB — CUP PACEART INCLINIC DEVICE CHECK
Battery Remaining Longevity: 161 mo
Battery Voltage: 2.79 V
Brady Statistic AP VP Percent: 3 %
Brady Statistic AP VS Percent: 0 %
Brady Statistic AS VP Percent: 97 %
Brady Statistic AS VS Percent: 0 %
Date Time Interrogation Session: 20160601142013
Lead Channel Impedance Value: 586 Ohm
Lead Channel Impedance Value: 721 Ohm
Lead Channel Pacing Threshold Amplitude: 1 V
Lead Channel Pacing Threshold Pulse Width: 0.4 ms
Lead Channel Sensing Intrinsic Amplitude: 11.2 mV
Lead Channel Sensing Intrinsic Amplitude: 4 mV
Lead Channel Setting Pacing Amplitude: 1.875
Lead Channel Setting Pacing Amplitude: 2 V
Lead Channel Setting Pacing Pulse Width: 0.4 ms
MDC IDC MSMT BATTERY IMPEDANCE: 100 Ohm
MDC IDC MSMT LEADCHNL RA PACING THRESHOLD PULSEWIDTH: 0.4 ms
MDC IDC MSMT LEADCHNL RV PACING THRESHOLD AMPLITUDE: 0.75 V
MDC IDC SET LEADCHNL RV SENSING SENSITIVITY: 4 mV

## 2014-08-22 LAB — LIPID PANEL
Cholesterol: 158 mg/dL (ref 0–200)
HDL: 25 mg/dL — ABNORMAL LOW (ref >39.00)
NonHDL: 133
Total CHOL/HDL Ratio: 6
Triglycerides: 288 mg/dL — ABNORMAL HIGH (ref 0.0–149.0)
VLDL: 57.6 mg/dL — ABNORMAL HIGH (ref 0.0–40.0)

## 2014-08-22 LAB — VITAMIN B12: Vitamin B-12: 372 pg/mL (ref 211–911)

## 2014-08-22 LAB — POCT INR: INR: 2.9

## 2014-08-22 NOTE — Telephone Encounter (Signed)
Spoke with the pt and wife to inform them that we received a fax from the pts cardiopulmonary rehab requesting the pt to have a lab (B12) assessment to check for deficiencies (possible) SP CABG, and for pt exhibiting difficulty focusing and following commands during cardiac rehab.  Informed Both parties that I contacted Sunday Spillers at [facility] Cardiopulmonary rehab to inform her that per Dr [name] we will add this lab onto the pts lab appt for today 6/1 at our office to assess his B12.  Pt is aware of the add on too.  Pt will also be seeing Dr Rayann Heman in EP for a f/u OV at 1045. Will follow-up with Sunday Spillers at [facility] Cardiopulmonary rehab when results are complete and fax to her facility for further review.  Both parties verbalized understanding and agrees with this plan.

## 2014-08-22 NOTE — Progress Notes (Signed)
Electrophysiology Office Note   Date:  08/22/2014   ID:  [name], DOB 1956/03/06, MRN [id number]  PCP:  Gilford Rile, MD  Cardiologist:  Dr [name] Primary Electrophysiologist: Thompson Grayer, MD    Chief Complaint  Patient presents with  . Complete heart block     History of Present Illness: Kevin English is a 59 y.o. male who presents today for electrophysiology evaluation.   Doing well s/p MVR/ maze/ pacemaker.  Making slow recovery but is participating in cardiac rehab.  He has occasional fatigue and dizziness. Today, he denies symptoms of palpitations, chest pain, shortness of breath, orthopnea, PND, lower extremity edema, claudication, dizziness, presyncope, syncope, bleeding, or neurologic sequela. The patient is tolerating medications without difficulties and is otherwise without complaint today.    Past Medical History  Diagnosis Date  . Hernia   . Chronic rhinitis   . Diverticulitis of colon 15 years ago  . Hyperlipidemia   . Hypogonadism male   . Obesity   . Hypothyroidism   . Persistent atrial fibrillation     a. sustained 160-180 on e-CARDIO monitor placed 07/29/2013. Placed on IV amiodarone at end of May 2015 due to continued paroxysms with RVR.  Marland Kitchen Rheumatic heart disease   . Mitral stenosis     a. Dx 07/2013 - through workup of 2D echo, TEE, and cath, felt to be moderate.  . Paroxysmal atrial flutter   . CAD (coronary artery disease)     a. By cath 08/2013: 70% distal cx-prox LPDA; tandem mod LAD lesions, o/w mild dz.  . Renal mass     a. Dx 08/2013: concerning for renal cancer.  Marland Kitchen HTN (hypertension)     hx of- under control  . Stroke 1998    "MRI showed 3 mild strokes"  . Borderline diabetes   . Depression   . Anxiety   . Dizzy spells   . Deafness in left ear   . Lung nodule seen on imaging study 04/10/2014    5 mm nodule right lower lobe  . OSA on CPAP     cpap  . CKD (chronic kidney disease), stage II     his creat. runs 2-2.5; hasnt seen neph  yet - sees dr. Tresa Moore at [facility]  . Renal cell carcinoma of right kidney 09/05/2013    clear cell renal cell carcinoma of right kidney treated by radical nephrectomy, Fuhrman grade 2-3, with maximum tumor diameter 2.7 cm, all tumor to find confined to the kidney, and all surgical margins negative (T1aN0).   . Yeast dermatitis 04/19/2014  . History of shingles   . S/P minimally invasive mitral valve replacement with metallic valve and maze procedure 05/15/2014    42mm Sorin [employer]omed[employer] Optiform mechanical valve placed via right mini thoracotomy approach  . S/P Minimally invasive maze operation for atrial fibrillation 05/15/2014    Left side lesion set using cryothermy with clipping of LA appendage  . Pacemaker 05/22/2014    for complete heart block  . Hx of echocardiogram     post MVR >> Echo 4/16:  Mild LVH, EF 45-50%, apical dyskinesis, trivial AI, mechanical MVR functioning appropriately, severe LAE   Past Surgical History  Procedure Laterality Date  . Tee without cardioversion N/A 08/16/2013    Procedure: TRANSESOPHAGEAL ECHOCARDIOGRAM (TEE);  Surgeon: Dorothy Spark, MD;  Location: [place];  Service: Cardiovascular;  Laterality: N/A;  . Inguinal hernia repair Bilateral first one in 80's    "I had one side done twice"  .  Cardiac catheterization  08/28/13  . Eye surgery Right 1990    "reconstructive, lens implant- from paintball accident"  . Robot assisted laparoscopic nephrectomy Right 09/05/2013    Procedure: ROBOTIC ASSISTED LAPAROSCOPIC RIGHT RADICAL NEPHRECTOMY;  Surgeon: Sharyn Creamer, MD;  Location: [facility];  Service: Urology;  Laterality: Right;  . Left and right heart catheterization with coronary angiogram N/A 08/28/2013    Procedure: LEFT AND RIGHT HEART CATHETERIZATION WITH CORONARY ANGIOGRAM;  Surgeon: Leonie Man, MD;  Location: [facility] CATH LAB;  Service: Cardiovascular;  Laterality: N/A;  . Mitral valve replacement Right 05/15/2014    Procedure: MINIMALLY INVASIVE MITRAL  VALVE (MV) REPLACEMENT;  Surgeon: Rexene Alberts, MD;  Location: [place];  Service: Open Heart Surgery;  Laterality: Right;  . Minimally invasive maze procedure N/A 05/15/2014    Procedure: MINIMALLY INVASIVE MAZE PROCEDURE;  Surgeon: Rexene Alberts, MD;  Location: [place];  Service: Open Heart Surgery;  Laterality: N/A;  . Tee without cardioversion N/A 05/15/2014    Procedure: TRANSESOPHAGEAL ECHOCARDIOGRAM (TEE);  Surgeon: Rexene Alberts, MD;  Location: [place];  Service: Open Heart Surgery;  Laterality: N/A;  . Permanent pacemaker insertion Left 05/22/2014    Procedure: PERMANENT PACEMAKER INSERTION;  Surgeon: Thompson Grayer, MD;  Location[facility] CATH LAB;  Service: Cardiovascular;  Laterality: Left;     Current Outpatient Prescriptions  Medication Sig Dispense Refill  . acetaminophen (TYLENOL) 325 MG tablet Take 325 mg by mouth every 6 (six) hours as needed (pain).     Marland Kitchen ALPRAZolam (XANAX) 0.5 MG tablet Take 0.5-1 mg by mouth at bedtime as needed for anxiety.     Marland Kitchen amLODipine (NORVASC) 2.5 MG tablet Take 1 tablet (2.5 mg total) by mouth daily. 60 tablet 1  . aspirin EC 81 MG tablet Take 1 tablet (81 mg total) by mouth daily.    Marland Kitchen levothyroxine (SYNTHROID) 75 MCG tablet Take 1 tablet (75 mcg total) by mouth daily before breakfast. 90 tablet 3  . metoprolol tartrate (LOPRESSOR) 25 MG tablet Take 1 tablet (25 mg total) by mouth 2 (two) times daily. 90 tablet 1  . sertraline (ZOLOFT) 50 MG tablet Take 1 tablet (50 mg total) by mouth daily. 90 tablet 4  . warfarin (COUMADIN) 5 MG tablet Take as directed by coumadin clinic 90 tablet 0   No current facility-administered medications for this visit.    Allergies:   Contrast media; Atorvastatin; Fenofibrate; and Pravastatin   Social History:  The patient  reports that he quit smoking about 3 months ago. His smoking use included Cigarettes. He has a 10 pack-year smoking history. He has never used smokeless tobacco. He reports that he does not drink alcohol or  use illicit drugs.   Family History:  The patient's family history includes Heart attack in his mother; Hypertension in his mother and another family member; Stroke in his mother.    ROS:  Please see the history of present illness.   All other systems are reviewed and negative.    PHYSICAL EXAM: VS:  BP 114/96 mmHg  Pulse 65  Ht 5\' 8"  (1.727 m)  Wt 84.097 kg (185 lb 6.4 oz)  BMI 28.20 kg/m2 , BMI Body mass index is 28.2 kg/(m^2). GEN: Well nourished, well developed, in no acute distress HEENT: normal Neck: no JVD, carotid bruits, or masses Cardiac: RRR; mechanical S1 Respiratory:  clear to auscultation bilaterally, normal work of breathing GI: soft, nontender, nondistended, + BS MS: no deformity or atrophy Skin: warm and dry,  device pocket is well healed Neuro:  Strength and sensation are intact Psych: euthymic mood, full affect  EKG:  EKG is ordered today. The ekg ordered today shows sinus rhythm with V pacing  Device interrogation is reviewed today in detail.  See PaceArt for details.   Recent Labs: 01/15/2014: TSH 0.63 05/16/2014: Magnesium 2.7* 05/19/2014: B Natriuretic Peptide 309.5* 05/26/2014: Hemoglobin 13.5; Platelets 392 07/11/2014: ALT 17; BUN 17; Creatinine 1.81*; Potassium 4.2; Pro B Natriuretic peptide (BNP) 54.0; Sodium 139    Lipid Panel     Component Value Date/Time   CHOL 280* 01/15/2014 0801   TRIG 572.0* 01/15/2014 0801   HDL 23.20* 01/15/2014 0801   CHOLHDL 12 01/15/2014 0801   VLDL 114.4* 01/15/2014 0801   LDLDIRECT 148.2 01/15/2014 0801     Wt Readings from Last 3 Encounters:  08/22/14 84.097 kg (185 lb 6.4 oz)  07/11/14 83.371 kg (183 lb 12.8 oz)  07/09/14 83.462 kg (184 lb)      Other studies Reviewed: Additional studies/ records that were reviewed today include: operative records, hospital records    ASSESSMENT AND PLAN:  1.  Complete heart block Av conduction has improved.  Will therefore turn MVP option on to minimize V  pacing Remote transmission in 4 weeks to follow-up on changes carelink  2. afib No Afib since MAZE Doing well s/p surgery On coumadin and ASA per Dr Roxy Manns  3. HTn Stable May need to decrease metoprolol further if fatigue and dizziness do not improve  Current medicines are reviewed at length with the patient today.   The patient does not have concerns regarding his medicines.  The following changes were made today:  none   Follow-up: carelink Follow-up with Drs Ronnell Freshwater as scheduled Return to see EP NP in 1 year  Signed, Thompson Grayer, MD  08/22/2014 11:53 AM     [facility] [address] [place] [address] [phone] (office) [phone] (fax)

## 2014-08-22 NOTE — Telephone Encounter (Signed)
Contacted Sunday Spillers at [facility] at [phone] to inform her that we will draw the pts B12 today at our office and fax the results over to her once Dr [name] reviews the results.  Sunday Spillers verbalized understanding and agrees with this plan, and very gracious for all the assistance provided.

## 2014-08-22 NOTE — Patient Instructions (Signed)
Medication Instructions:  Your physician recommends that you continue on your current medications as directed. Please refer to the Current Medication list given to you today.   Labwork: None ordered  Testing/Procedures: None ordered  Follow-Up: Remote monitoring is used to monitor your Pacemaker or ICD from home. This monitoring reduces the number of office visits required to check your device to one time per year. It allows [place] to keep an eye on the functioning of your device to ensure it is working properly. You are scheduled for a device check from home on 09/25/14 and 11/21/14. You may send your transmission at any time that day. If you have a wireless device, the transmission will be sent automatically. After your physician reviews your transmission, you will receive a postcard with your next transmission date.   Your physician wants you to follow-up in: 12 months with Kevin Marshall, NP You will receive a reminder letter in the mail two months in advance. If you don't receive a letter, please call our office to schedule the follow-up appointment.   Any Other Special Instructions Will Be Listed Below (If Applicable).

## 2014-08-23 ENCOUNTER — Other Ambulatory Visit: Payer: Self-pay | Admitting: Pharmacist

## 2014-08-23 MED ORDER — ROSUVASTATIN CALCIUM 5 MG PO TABS: 5.0000 mg | ORAL_TABLET | Freq: Every day | ORAL | Status: DC

## 2014-09-04 ENCOUNTER — Encounter: Payer: Self-pay | Admitting: Cardiology

## 2014-09-05 ENCOUNTER — Telehealth: Payer: Self-pay | Admitting: Cardiology

## 2014-09-05 ENCOUNTER — Encounter: Payer: Self-pay | Admitting: Cardiology

## 2014-09-05 ENCOUNTER — Other Ambulatory Visit: Payer: Self-pay | Admitting: Cardiology

## 2014-09-05 DIAGNOSIS — I099 Rheumatic heart disease, unspecified: Secondary | ICD-10-CM

## 2014-09-05 DIAGNOSIS — E785 Hyperlipidemia, unspecified: Secondary | ICD-10-CM

## 2014-09-05 NOTE — Telephone Encounter (Signed)
Left message with the pts daughter to have the pt and wife to call back with new order per Dr [name] to check labs- CPK and CMET, for mychart message sent to our office today with pts complaints.  Daughter verbalized she will endorse this message to both parties to return a call back today.

## 2014-09-05 NOTE — Telephone Encounter (Signed)
Ok to refill 

## 2014-09-05 NOTE — Telephone Encounter (Signed)
New message      Returning Ivy's call

## 2014-09-05 NOTE — Telephone Encounter (Signed)
Please order CMP and CPK.     Thank you,    KN

## 2014-09-05 NOTE — Telephone Encounter (Signed)
Pt and wife returning a call back from this morning, in regards to Dr Francesca Oman recommendations for the pt to have labs done to check CMET and CPK for mychart email/complaints.  Per the wife the pt just recently had a CMET done on 6/1 at his PCP office.   No CPK was drawn.  Wife and pt would like to schedule to come in for lab tomorrow 6/16 at our office. Wife states she will bring the pts CMET results for Dr [name] to review at tomorrow's lab appt.  Placed order for CPK and scheduled the lab appt.  Will inform Dr [name] that the pt had CMET done on 6/1 and will bring results into tomorrow's lab appt for her review.  Pt and wife verbalized understanding and agrees with this plan.

## 2014-09-05 NOTE — Telephone Encounter (Signed)
Left message with the pts daughter to have the pt and wife to call back with new order per Dr [name] to check labs-  CPK and CMET, for mychart message sent to our office today with pts complaints.  Daughter verbalized she will endorse this message to both parties to return a call back today.

## 2014-09-06 ENCOUNTER — Other Ambulatory Visit (INDEPENDENT_AMBULATORY_CARE_PROVIDER_SITE_OTHER): Payer: Managed Care, Other (non HMO) | Admitting: *Deleted

## 2014-09-06 DIAGNOSIS — I099 Rheumatic heart disease, unspecified: Secondary | ICD-10-CM

## 2014-09-06 DIAGNOSIS — E785 Hyperlipidemia, unspecified: Secondary | ICD-10-CM | POA: Diagnosis not present

## 2014-09-06 LAB — CK: Total CK: 31 U/L (ref 7–232)

## 2014-09-20 ENCOUNTER — Ambulatory Visit (INDEPENDENT_AMBULATORY_CARE_PROVIDER_SITE_OTHER): Payer: Managed Care, Other (non HMO) | Admitting: *Deleted

## 2014-09-20 ENCOUNTER — Other Ambulatory Visit: Payer: Self-pay | Admitting: Urology

## 2014-09-20 ENCOUNTER — Ambulatory Visit (HOSPITAL_COMMUNITY)
Admission: RE | Admit: 2014-09-20 | Discharge: 2014-09-20 | Disposition: A | Discharge status: Home / Self Care | Payer: Managed Care, Other (non HMO) | Source: Ambulatory Visit | Attending: Urology | Admitting: Urology

## 2014-09-20 DIAGNOSIS — I5033 Acute on chronic diastolic (congestive) heart failure: Secondary | ICD-10-CM | POA: Diagnosis not present

## 2014-09-20 DIAGNOSIS — Z5181 Encounter for therapeutic drug level monitoring: Secondary | ICD-10-CM | POA: Diagnosis not present

## 2014-09-20 DIAGNOSIS — I10 Essential (primary) hypertension: Secondary | ICD-10-CM | POA: Insufficient documentation

## 2014-09-20 DIAGNOSIS — J984 Other disorders of lung: Secondary | ICD-10-CM | POA: Diagnosis not present

## 2014-09-20 DIAGNOSIS — C641 Malignant neoplasm of right kidney, except renal pelvis: Secondary | ICD-10-CM

## 2014-09-20 DIAGNOSIS — Z954 Presence of other heart-valve replacement: Secondary | ICD-10-CM

## 2014-09-20 DIAGNOSIS — I4891 Unspecified atrial fibrillation: Secondary | ICD-10-CM | POA: Diagnosis not present

## 2014-09-20 DIAGNOSIS — Z85528 Personal history of other malignant neoplasm of kidney: Secondary | ICD-10-CM | POA: Diagnosis present

## 2014-09-20 DIAGNOSIS — I050 Rheumatic mitral stenosis: Secondary | ICD-10-CM

## 2014-09-20 LAB — POCT INR: INR: 2.7

## 2014-09-21 ENCOUNTER — Telehealth: Payer: Self-pay | Admitting: Pharmacist

## 2014-09-21 NOTE — Telephone Encounter (Signed)
Pt came to [facility] appt on 6/30.  He had reported issues with the Crestor 5mg  three days of the week to Dr. [name] on 6/14.  He started having fatigue, depression and unsteady gait.  His CK was normal.  He stopped the Crestor and his symptoms have improved.  He and his wife want to know what his next steps are.  Given he has failed 3 statins, may consider PCSK-9 at this time.  He has Pharmacist, community.  They would like to proceed with benefits investigation.  Pt signed paperwork and will submit to insurance company for review.

## 2014-09-25 ENCOUNTER — Telehealth: Payer: Self-pay | Admitting: Internal Medicine

## 2014-09-25 ENCOUNTER — Telehealth: Payer: Self-pay | Admitting: Cardiology

## 2014-09-25 ENCOUNTER — Encounter: Payer: Managed Care, Other (non HMO) | Admitting: *Deleted

## 2014-09-25 NOTE — Telephone Encounter (Signed)
Spoke w/ pt wife and informed her that they can pt home monitor into town to send remote transmission at a [employer] or [employer]. Pt wife verbalized understanding.

## 2014-09-25 NOTE — Telephone Encounter (Signed)
New problem   Pt need to speak back to device clinic in reference to remote transmission.

## 2014-09-25 NOTE — Telephone Encounter (Signed)
Spoke with pt and reminded pt of remote transmission that is due today. Pt verbalized understanding.   

## 2014-09-26 ENCOUNTER — Encounter: Payer: Self-pay | Admitting: Cardiology

## 2014-10-15 ENCOUNTER — Ambulatory Visit (HOSPITAL_COMMUNITY): Payer: Managed Care, Other (non HMO) | Attending: Cardiology

## 2014-10-15 ENCOUNTER — Other Ambulatory Visit: Payer: Self-pay | Admitting: Cardiology

## 2014-10-15 ENCOUNTER — Other Ambulatory Visit: Payer: Self-pay

## 2014-10-15 DIAGNOSIS — Z952 Presence of prosthetic heart valve: Secondary | ICD-10-CM | POA: Diagnosis present

## 2014-10-15 DIAGNOSIS — I517 Cardiomegaly: Secondary | ICD-10-CM | POA: Insufficient documentation

## 2014-10-15 DIAGNOSIS — N189 Chronic kidney disease, unspecified: Secondary | ICD-10-CM | POA: Insufficient documentation

## 2014-10-15 DIAGNOSIS — E785 Hyperlipidemia, unspecified: Secondary | ICD-10-CM | POA: Diagnosis not present

## 2014-10-15 DIAGNOSIS — I050 Rheumatic mitral stenosis: Secondary | ICD-10-CM

## 2014-10-15 DIAGNOSIS — I5033 Acute on chronic diastolic (congestive) heart failure: Secondary | ICD-10-CM | POA: Diagnosis not present

## 2014-10-15 DIAGNOSIS — Z954 Presence of other heart-valve replacement: Secondary | ICD-10-CM

## 2014-10-15 DIAGNOSIS — Z87891 Personal history of nicotine dependence: Secondary | ICD-10-CM | POA: Diagnosis not present

## 2014-10-15 DIAGNOSIS — R0609 Other forms of dyspnea: Secondary | ICD-10-CM | POA: Diagnosis not present

## 2014-10-15 DIAGNOSIS — I129 Hypertensive chronic kidney disease with stage 1 through stage 4 chronic kidney disease, or unspecified chronic kidney disease: Secondary | ICD-10-CM | POA: Insufficient documentation

## 2014-10-18 ENCOUNTER — Telehealth: Payer: Self-pay | Admitting: Pharmacist

## 2014-10-18 DIAGNOSIS — E785 Hyperlipidemia, unspecified: Secondary | ICD-10-CM

## 2014-10-18 NOTE — Telephone Encounter (Signed)
Spoke with patient re: Praluent rejection - insurance wants him to try Zetia first. LDL was much improved 1 month ago from 148 to 10 with lifestyle changes. Still above goal < 70, discussed addition of Zetia with patient and his wife. They would like to continue working on diet and exercise before adding another medication (intolerances to Lipitor 10mg , pravastatin 40mg , and Crestor 5mg  TIW). Will f/u with lipid panel in 2 months and reassess potential need for Zetia at that time.

## 2014-10-20 ENCOUNTER — Other Ambulatory Visit: Payer: Self-pay | Admitting: Cardiology

## 2014-10-22 ENCOUNTER — Ambulatory Visit: Payer: Managed Care, Other (non HMO) | Admitting: Cardiology

## 2014-10-22 ENCOUNTER — Other Ambulatory Visit: Payer: Self-pay | Admitting: *Deleted

## 2014-10-23 ENCOUNTER — Telehealth: Payer: Self-pay | Admitting: Cardiology

## 2014-10-23 NOTE — Telephone Encounter (Signed)
New message     Faxing over a referral for cardiac rehab for pt Rehab needing to have a signed referral

## 2014-10-23 NOTE — Telephone Encounter (Signed)
When referral is received, Dr [name] will review, sign, advise, and fax back to pts cardiac rehab.

## 2014-10-24 NOTE — Telephone Encounter (Signed)
Received cardiac rehab referral from [facility] today.  Will have Dr [name] review, sign, and fax back to cardiac rehab on return to the office tomorrow.

## 2014-11-01 ENCOUNTER — Ambulatory Visit (INDEPENDENT_AMBULATORY_CARE_PROVIDER_SITE_OTHER): Payer: Managed Care, Other (non HMO)

## 2014-11-01 ENCOUNTER — Ambulatory Visit (INDEPENDENT_AMBULATORY_CARE_PROVIDER_SITE_OTHER): Payer: Managed Care, Other (non HMO) | Admitting: Cardiology

## 2014-11-01 ENCOUNTER — Encounter: Payer: Self-pay | Admitting: Cardiology

## 2014-11-01 VITALS — BP 128/78 | HR 69 | Ht 68.0 in | Wt 186.0 lb

## 2014-11-01 DIAGNOSIS — I050 Rheumatic mitral stenosis: Secondary | ICD-10-CM

## 2014-11-01 DIAGNOSIS — Z954 Presence of other heart-valve replacement: Secondary | ICD-10-CM

## 2014-11-01 DIAGNOSIS — I5033 Acute on chronic diastolic (congestive) heart failure: Secondary | ICD-10-CM | POA: Diagnosis not present

## 2014-11-01 DIAGNOSIS — G4730 Sleep apnea, unspecified: Secondary | ICD-10-CM

## 2014-11-01 DIAGNOSIS — I4891 Unspecified atrial fibrillation: Secondary | ICD-10-CM

## 2014-11-01 DIAGNOSIS — I442 Atrioventricular block, complete: Secondary | ICD-10-CM

## 2014-11-01 DIAGNOSIS — I2583 Coronary atherosclerosis due to lipid rich plaque: Secondary | ICD-10-CM

## 2014-11-01 DIAGNOSIS — Z5181 Encounter for therapeutic drug level monitoring: Secondary | ICD-10-CM | POA: Diagnosis not present

## 2014-11-01 DIAGNOSIS — I480 Paroxysmal atrial fibrillation: Secondary | ICD-10-CM

## 2014-11-01 DIAGNOSIS — I2510 Atherosclerotic heart disease of native coronary artery without angina pectoris: Secondary | ICD-10-CM

## 2014-11-01 DIAGNOSIS — I10 Essential (primary) hypertension: Secondary | ICD-10-CM

## 2014-11-01 DIAGNOSIS — Z950 Presence of cardiac pacemaker: Secondary | ICD-10-CM

## 2014-11-01 DIAGNOSIS — I5032 Chronic diastolic (congestive) heart failure: Secondary | ICD-10-CM

## 2014-11-01 LAB — POCT INR: INR: 2.6

## 2014-11-01 NOTE — Progress Notes (Signed)
Patient ID: Kevin English, male   DOB: 08/16/1955, 59 y.o.   MRN: [id number]     Chief complain: Foot pain, myalgias  Cardiology Office Note   Date:  11/01/2014   ID:  Kevin English, DOB [birth year], MRN [id number]  PCP:  Gilford Rile, MD  Cardiologist:  Dr. Ena Dawley   Electrophysiologist:  Dr. Thompson Grayer   No chief complaint on file.    History of Present Illness: Kevin English is a 59 y.o. male with a hx of HTN, HL (intol to statins), former smoker, PAFib, Rheumatic heart disease with severe mitral stenosis, diastolic HF, depression, CKD, renal cell CA, OSA.  He has been on Coumadin.  He had been prepared for minimally invasive MVR and placed on Amiodarone.  However, he developed clear cell renal cell CA and underwent R nephrectomy.  He developed fatigue with beta-blocker and Bystolic was DC'd.    He was ultimately stabilized and admitted 2/23-3/5.  He underwent Minimally-Invasive Mitral Valve Replacement (Sorin Carbomedics Optiform bileaflet mechanical valve) and Maze Procedure with Left atrial lesion set using cryothermy and Clipping of Left Atrial Appendage with Dr. Roxy Manns.  Post op course was complicated by complete heart block.  He ultimately required implantation of a Medtronic Adapta L dual-chamber pacemaker.  He also developed HCAP and required IV antibiotics, a/c diastolic HF requiring diuresis.  He returns for FU.  The patient tells me that he feels "lousy." He is overall frustrated about his health, recent surgeries and need for pacemaker. He has dehiscence of his right groin wound that is being packed. His wife handles dressing changes. She tells me that it is improving. Overall, he feels that he is better. He is not walking that much. He feels short of breath at times. When I ask him further about this, he doesn't really think his breathing is any worse. His chest remains sore. This is improved. He describes NYHA 2b symptoms. He denies orthopnea, PND or edema. He wears CPAP  nightly. He had diarrhea in the hospital. C. difficile was negative. This is resolved. He denies fever, cough, wheezing. He denies melena or hematochezia. He denies vomiting or diarrhea. He tells me that his tolerance for just about anything is poor. His wife tells me that he gets frustrated easily. He is currently on Zoloft and Xanax.  07/11/14 - the patient is coming after 1 months, he is in better spirits, starting to feel significantly better and is depression has improved significantly as well. He has been evaluated for the cardiac rehab at the [facility] and defer start time until his groin wound would heal. It has healed now and he is ready to start the next week. He denies any palpitations, syncope, CP, he has improving DOE. He is complaint with his meds and has no bleeding with warfarin, follows in our clinic. No orthopnea, PND, or LE edema.   11/01/2014 - the patient is coming after 4 months, he feels significantly better, he denies DOE, CP, palpitations, no dizziness or syncope. His PM was last interrogated on 08/22/2014 and was working well, the next interrogation scheduled for 11/21/14. No bleeding from warfarin. He is complaint with his meds. He was trying to exercise but suffers from plantar fasciitis. Overall he feels much improved.   Studies/Reports Reviewed Today:  Dg Chest 2 View  06/11/2014    IMPRESSION: 1. Almost complete resolution of interstitial edema and accentuation of the interstitial markings. 2.  Resolution of small left effusion. 3. Small residual right effusion.  Electronically Signed   By: [name] M.D.   On: 06/11/2014 13:01    Carotid US 04/16/14 Bilateral - 1% to 39% ICA stensosis.  TEE 7/82/95 - Systolic function was normal. Wall motion was normal - Aortic valve: There was mild regurgitation. - Mitral valve: Valve area by pressure half-time: 1.4 cm^2. - Left atrium: The atrium was moderately dilated.  Impressions:  Rheumatic mitral valve with moderate  leaflet tip thickening and minimal calcifications. Moderate mitral stenosis and trace mitral regurgitation.  Cardiac cath 08/28/13 LM:  Normal LAD:  Proximal 40-50%, distal 50-60% LCx:  Small OM 70-80% (not amenable to PCI or CABG), PDA 70% LPDA:  10-20% RCA:  Mid 30-40%   Past Medical History  Diagnosis Date  . Hernia   . Chronic rhinitis   . Diverticulitis of colon 15 years ago  . Hyperlipidemia   . Hypogonadism male   . Obesity   . Hypothyroidism   . Persistent atrial fibrillation     a. sustained 160-180 on e-CARDIO monitor placed 07/29/2013. Placed on IV amiodarone at end of May 2015 due to continued paroxysms with RVR.  Marland Kitchen Rheumatic heart disease   . Mitral stenosis     a. Dx 07/2013 - through workup of 2D echo, TEE, and cath, felt to be moderate.  . Paroxysmal atrial flutter   . CAD (coronary artery disease)     a. By cath 08/2013: 70% distal cx-prox LPDA; tandem mod LAD lesions, o/w mild dz.  . Renal mass     a. Dx 08/2013: concerning for renal cancer.  Marland Kitchen HTN (hypertension)     hx of- under control  . Stroke 1998    "MRI showed 3 mild strokes"  . Borderline diabetes   . Depression   . Anxiety   . Dizzy spells   . Deafness in left ear   . Lung nodule seen on imaging study 04/10/2014    5 mm nodule right lower lobe  . OSA on CPAP     cpap  . CKD (chronic kidney disease), stage II     his creat. runs 2-2.5; hasnt seen neph yet - sees dr. Tresa Moore at [facility]  . Renal cell carcinoma of right kidney 09/05/2013    clear cell renal cell carcinoma of right kidney treated by radical nephrectomy, Fuhrman grade 2-3, with maximum tumor diameter 2.7 cm, all tumor to find confined to the kidney, and all surgical margins negative (T1aN0).   . Yeast dermatitis 04/19/2014  . History of shingles   . S/P minimally invasive mitral valve replacement with metallic valve and maze procedure 05/15/2014    63mm Sorin Carbomedics Optiform mechanical valve placed via right mini thoracotomy approach    . S/P Minimally invasive maze operation for atrial fibrillation 05/15/2014    Left side lesion set using cryothermy with clipping of LA appendage  . Pacemaker 05/22/2014    for complete heart block  . Hx of echocardiogram     post MVR >> Echo 4/16:  Mild LVH, EF 45-50%, apical dyskinesis, trivial AI, mechanical MVR functioning appropriately, severe LAE    Past Surgical History  Procedure Laterality Date  . Tee without cardioversion N/A 08/16/2013    Procedure: TRANSESOPHAGEAL ECHOCARDIOGRAM (TEE);  Surgeon: Dorothy Spark, MD;  Location: [place];  Service: Cardiovascular;  Laterality: N/A;  . Inguinal hernia repair Bilateral first one in 80's    "I had one side done twice"  . Cardiac catheterization  08/28/13  . Eye surgery Right 1990    "  reconstructive, lens implant- from paintball accident"  . Robot assisted laparoscopic nephrectomy Right 09/05/2013    Procedure: ROBOTIC ASSISTED LAPAROSCOPIC RIGHT RADICAL NEPHRECTOMY;  Surgeon: Sharyn Creamer, MD;  Location: [facility];  Service: Urology;  Laterality: Right;  . Left and right heart catheterization with coronary angiogram N/A 08/28/2013    Procedure: LEFT AND RIGHT HEART CATHETERIZATION WITH CORONARY ANGIOGRAM;  Surgeon: Leonie Man, MD;  Location: [facility] CATH LAB;  Service: Cardiovascular;  Laterality: N/A;  . Mitral valve replacement Right 05/15/2014    Procedure: MINIMALLY INVASIVE MITRAL VALVE (MV) REPLACEMENT;  Surgeon: Rexene Alberts, MD;  Location: [place];  Service: Open Heart Surgery;  Laterality: Right;  . Minimally invasive maze procedure N/A 05/15/2014    Procedure: MINIMALLY INVASIVE MAZE PROCEDURE;  Surgeon: Rexene Alberts, MD;  Location: [place];  Service: Open Heart Surgery;  Laterality: N/A;  . Tee without cardioversion N/A 05/15/2014    Procedure: TRANSESOPHAGEAL ECHOCARDIOGRAM (TEE);  Surgeon: Rexene Alberts, MD;  Location: [place];  Service: Open Heart Surgery;  Laterality: N/A;  . Permanent pacemaker insertion Left  05/22/2014    Procedure: PERMANENT PACEMAKER INSERTION;  Surgeon: Thompson Grayer, MD;  Location: [facility] C[facility] LAB;  Service: Cardiovascular;  Laterality: Left;     Current Outpatient Prescriptions  Medication Sig Dispense Refill  . acetaminophen (TYLENOL) 325 MG tablet Take 325 mg by mouth every 6 (six) hours as needed (pain).     Marland Kitchen ALPRAZolam (XANAX) 0.5 MG tablet Take 0.5-1 mg by mouth at bedtime as needed for anxiety.     Marland Kitchen amLODipine (NORVASC) 2.5 MG tablet Take 1 tablet (2.5 mg total) by mouth daily. 60 tablet 1  . aspirin EC 81 MG tablet Take 1 tablet (81 mg total) by mouth daily.    Marland Kitchen levothyroxine (SYNTHROID, LEVOTHROID) 75 MCG tablet TAKE 1 TABLET EVERY DAY BEFORE BREAKFAST 90 tablet 1  . metoprolol tartrate (LOPRESSOR) 25 MG tablet Take 1 tablet (25 mg total) by mouth 2 (two) times daily. 90 tablet 1  . rosuvastatin (CRESTOR) 5 MG tablet Take 1 tablet (5 mg total) by mouth daily. Or as directed 30 tablet 1  . sertraline (ZOLOFT) 50 MG tablet Take 1 tablet (50 mg total) by mouth daily. 90 tablet 4  . warfarin (COUMADIN) 5 MG tablet Take as directed by coumadin clinic 30 tablet 3   No current facility-administered medications for this visit.    Allergies:   Contrast media; Atorvastatin; Crestor; Fenofibrate; and Pravastatin    Social History:  The patient  reports that he quit smoking about 5 months ago. His smoking use included Cigarettes. He has a 10 pack-year smoking history. He has never used smokeless tobacco. He reports that he does not drink alcohol or use illicit drugs.   Family History:  The patient's family history includes Heart attack in his mother; Hypertension in his mother and another family member; Stroke in his mother.    ROS:   Please see the history of present illness.   Review of Systems  Cardiovascular: Positive for dyspnea on exertion.  All other systems reviewed and are negative.    PHYSICAL EXAM: VS:  BP 128/78 mmHg  Pulse 69  Ht 5\' 8"  (1.727 m)  Wt 186  lb (84.369 kg)  BMI 28.29 kg/m2    Wt Readings from Last 3 Encounters:  11/01/14 186 lb (84.369 kg)  08/22/14 185 lb 6.4 oz (84.097 kg)  07/11/14 183 lb 12.8 oz (83.371 kg)     GEN: Well  nourished, well developed, in no acute distress HEENT: normal Neck: no JVD, no masses Cardiac:  Mechanical S1/Normal S2, RRR; no murmur ,  no rubs or gallops, no edema  Chest: Right chest incision well healed without erythema or discharge Respiratory:  Decreased breath sounds bilaterally, no wheezing, rhonchi or rales. GI: soft, nontender, nondistended, + BS MS: no deformity or atrophy Skin: warm and dry  Neuro:  CNs II-XII intact, Strength and sensation are intact Psych: Normal affect  EKG:  EKG is ordered today.  It demonstrates:   NSR, HR 64, V paced  Recent Labs: 01/15/2014: TSH 0.63 05/16/2014: Magnesium 2.7* 05/19/2014: B Natriuretic Peptide 309.5* 05/26/2014: Hemoglobin 13.5; Platelets 392 07/11/2014: ALT 17; BUN 17; Creatinine, Ser 1.81*; Potassium 4.2; Pro B Natriuretic peptide (BNP) 54.0; Sodium 139   Lipid Panel    Component Value Date/Time   CHOL 158 08/22/2014 1209   TRIG 288.0* 08/22/2014 1209   HDL 25.00* 08/22/2014 1209   CHOLHDL 6 08/22/2014 1209   VLDL 57.6* 08/22/2014 1209   LDLDIRECT 84.0 08/22/2014 1209      ASSESSMENT AND PLAN:  1. Mitral stenosis S/P minimally invasive mitral valve replacement with metallic valve and maze procedure  -  Completed cardiac rehabilitation, encouraged to continue exercising minimum 3x/week  -  SBE prophylaxis card was given.   -  The last echo showed normally functioning mitral valve and improved LVEF 45-50% --> 50-55%.  -  Coumadin managed in our Coumadin clinic.  2. LV dysfunction - LVEF 45-50%, the patient has only 1 kidney and the last Crea 1.7 --> 1.55, improved LVEF, no ACEI/ARB is he is post nephrectomy. He is euvolemic.  3. Paroxysmal atrial fibrillation Maintaining NSR. Continue beta blocker, Coumadin.  4. Essential  hypertension Blood pressure controlled on current regimen.  5. Hyperlipidemia He is intolerant to statins (rosuva, prava, atorva and fenofibrate), he was denied PCSK 9 inhibitors, Gay Filler Eral follows.   6. Coronary artery disease involving native coronary artery of native heart without angina pectoris No apparent angina. If he continues to have issues with shortness of breath, we could try to initiate nitrates. He did have some moderate severe disease in a small OM branch that was not amenable to PCI. Continue aspirin, beta blocker, lipids followed by [facility].  7. CHB (complete heart block) S/P placement of cardiac pacemaker, not paced today. Follow up with EP as planned.  8. Sleep apnea Continue CPAP.  Renal cell carcinoma of right kidney-nephrectomy June 2015 Follow-up with urology as planned  CKD (chronic kidney disease), stage II Followed by [facility] .  Depression  Improved on Sertraline.   Follow up in 4 months.  Dorothy Spark 11/01/2014

## 2014-11-01 NOTE — Patient Instructions (Signed)
Your physician recommends that you continue on your current medications as directed. Please refer to the Current Medication list given to you today.   Your physician recommends that you schedule a follow-up appointment in:  4 MONTHS  WITH DR NELSON 

## 2014-11-12 ENCOUNTER — Ambulatory Visit (INDEPENDENT_AMBULATORY_CARE_PROVIDER_SITE_OTHER): Payer: Managed Care, Other (non HMO) | Admitting: Podiatry

## 2014-11-12 ENCOUNTER — Encounter: Payer: Self-pay | Admitting: Thoracic Surgery (Cardiothoracic Vascular Surgery)

## 2014-11-12 ENCOUNTER — Encounter: Payer: Self-pay | Admitting: Podiatry

## 2014-11-12 ENCOUNTER — Ambulatory Visit: Payer: Managed Care, Other (non HMO)

## 2014-11-12 ENCOUNTER — Ambulatory Visit (INDEPENDENT_AMBULATORY_CARE_PROVIDER_SITE_OTHER): Payer: Managed Care, Other (non HMO) | Admitting: Thoracic Surgery (Cardiothoracic Vascular Surgery)

## 2014-11-12 ENCOUNTER — Ambulatory Visit (INDEPENDENT_AMBULATORY_CARE_PROVIDER_SITE_OTHER): Payer: Managed Care, Other (non HMO)

## 2014-11-12 VITALS — BP 130/84 | HR 72 | Resp 18

## 2014-11-12 VITALS — BP 139/90 | HR 69 | Resp 20 | Ht 68.0 in | Wt 186.0 lb

## 2014-11-12 DIAGNOSIS — Z954 Presence of other heart-valve replacement: Secondary | ICD-10-CM

## 2014-11-12 DIAGNOSIS — M79671 Pain in right foot: Secondary | ICD-10-CM

## 2014-11-12 DIAGNOSIS — M722 Plantar fascial fibromatosis: Secondary | ICD-10-CM

## 2014-11-12 DIAGNOSIS — Z9889 Other specified postprocedural states: Secondary | ICD-10-CM | POA: Diagnosis not present

## 2014-11-12 DIAGNOSIS — Z8679 Personal history of other diseases of the circulatory system: Secondary | ICD-10-CM

## 2014-11-12 NOTE — Progress Notes (Signed)
   Subjective:    Patient ID: Kevin English, male    DOB: 01-21-1956, 59 y.o.   MRN: [id number]  HPI 59 year old male presents the office today with concerns of right heel pain which then ongoing since starting cardiac rehabilitation in May 2016. He states that he has pain in the morning and gets better after he walks some and the pain relieves after eating breakfast. He states that he sits in his recliner and gets back up he has pain which again is relieved by activity. He denies any history of injury or trauma. Denies any swelling or redness. No tingling or numbness. He has been stretching somewhat as well as applying ice without much relief. No other complaints at this time.  Review of Systems  HENT: Positive for sinus pressure.   Musculoskeletal: Positive for back pain and gait problem.       Muscle pain  All other systems reviewed and are negative.      Objective:   Physical Exam AAO x3, NAD DP/PT pulses palpable bilaterally, CRT less than 3 seconds Protective sensation intact with Simms Weinstein monofilament, vibratory sensation intact, Achilles tendon reflex intact Tenderness to palpation overlying the plantar medial tubercle of the calcaneus to the right heel at the insertion of the plantar fascia. There is no pain along the course of plantar fascia within the arch of the foot. There is no pain with lateral compression of the calcaneus or pain the vibratory sensation. No pain on the posterior aspect of the calcaneus or along the course/insertion of the Achilles tendon. There is no overlying edema, erythema, increase in warmth.  No other areas of tenderness palpation or pain with vibratory sensation to the foot/ankle.  MMT 5/5, ROM WNL No open lesions or pre-ulcerative lesions are identified. No pain with calf compression, swelling, warmth, erythema.     Assessment & Plan:  59 year old male with right heel pain, likely plantar fasciitis -Treatment options discussed including all  alternatives, risks, and complications -X-rays were obtained and reviewed with the patient.  -Discussed steroid injection however he wishes to hold off on that at this time. -We'll hold off on anti-inflammatories given cardiac conditions. -Discussed stretching exercises to be performed and a daily basis. -Ice to the area -Discussed shoe gear modifications. Also discuss orthotics. -Dispensed plantar fascial brace. -He has a night splint at home and recommended to continue with this. -Follow-up 4 weeks or sooner if any problems arise. In the meantime, encouraged to call the office with any questions, concerns, change in symptoms.   Celesta Gentile, DPM

## 2014-11-12 NOTE — Progress Notes (Signed)
[address]       [place] [address]             [phone]     CARDIOTHORACIC SURGERY OFFICE NOTE  Referring Provider is Dorothy Spark, MD PCP is Gilford Rile, MD   HPI:  Patient returns for routine follow-up and rhythm check approximately 6 months status post minimally invasive mitral valve replacement using a bileaflet mechanical prosthetic valve and Maze procedure on 05/15/2014 for severe rheumatic mitral valve stenosis with recurrent paroxysmal atrial fibrillation. Postoperatively the patient developed complete heart block requiring permanent pacemaker placement. He later developed superficial dehiscence of his right groin incision which subsequently healed by secondary intent. He was last seen here in our office on 07/09/2014 at which time he was making good progress. Since then the patient has done very well.  Follow-up echocardiogram performed 10/15/2014 demonstrates normal left ventricular systolic function with normal functioning canthal mitral valve prosthesis.  He was seen in follow-up by Dr. [name] on 11/01/2014 at which time he was doing very well. He returns for office for routine follow-up today. He states that his only problem at this time is pain in his foot related plantar fasciitis. He otherwise feels very well. His exercise tolerance is good. He has no exertional shortness of breath and he states that his breathing feels considerably better than it did prior to surgery. He has not been having palpitations or dizzy spells. He no longer has any chest discomfort from his surgical incision. He states that he occasionally feels his pacemaker move when he moves his left shoulder.  He has not had any problems with Coumadin management.   Current Outpatient Prescriptions  Medication Sig Dispense Refill  . acetaminophen (TYLENOL) 325 MG tablet Take 325 mg by mouth every 6 (six) hours as needed (pain).     Marland Kitchen ALPRAZolam (XANAX) 0.5 MG tablet Take 0.5-1 mg  by mouth at bedtime as needed for anxiety.     Marland Kitchen amLODipine (NORVASC) 2.5 MG tablet Take 1 tablet (2.5 mg total) by mouth daily. 60 tablet 1  . aspirin EC 81 MG tablet Take 1 tablet (81 mg total) by mouth daily.    Marland Kitchen levothyroxine (SYNTHROID, LEVOTHROID) 75 MCG tablet TAKE 1 TABLET EVERY DAY BEFORE BREAKFAST 90 tablet 1  . metoprolol tartrate (LOPRESSOR) 25 MG tablet Take 1 tablet (25 mg total) by mouth 2 (two) times daily. 90 tablet 1  . sertraline (ZOLOFT) 50 MG tablet Take 1 tablet (50 mg total) by mouth daily. 90 tablet 4  . warfarin (COUMADIN) 5 MG tablet Take as directed by coumadin clinic 30 tablet 3   No current facility-administered medications for this visit.      Physical Exam:   BP 139/90 mmHg  Pulse 69  Resp 20  Ht 5\' 8"  (1.727 m)  Wt 186 lb (84.369 kg)  BMI 28.29 kg/m2  SpO2 98%  General:  Well-appearing  Chest:   clear  CV:   Regular rate and rhythm with mechanical heart valve sounds, no murmur appreciated  Incisions:  Completely healed  Abdomen:  Soft and nontender  Extremities:  Warm and well-perfused  Diagnostic Tests:  Transthoracic Echocardiography  Patient:  Raine, Elsass MR #:    [id number] Study Date: 10/15/2014 Gender:   M Age:    59 Height:   172.7 cm Weight:   83 kg BSA:    2.01 m^2 Pt. Status: Room:  SONOGRAPHER Cindy Hazy, RDCS ATTENDING  Ena Dawley, M.D.  ORDERING   Ena Dawley, M.D. REFERRING  Ena Dawley, M.D. PERFORMING  [facility], Outpatient  cc:  ------------------------------------------------------------------- LV EF: 50% -  55%  ------------------------------------------------------------------- Indications:   Z95.4 Mitral Valve Replacement.  ------------------------------------------------------------------- History:  PMH: Acquired from the patient and from the patient&'s chart. PMH: Paroxysmal Atrial Fibrillation. Chronic Kidney Disease. Rheumatic Heart Disease.  Paroxysmal Atrial Fibrillation. Risk factors: Former tobacco use. Hypertension. Dyslipidemia.  ------------------------------------------------------------------- Study Conclusions  - Left ventricle: The cavity size was normal. Wall thickness was increased in a pattern of mild LVH. Systolic function was low normal to mildly reduced. The estimated ejection fraction was in the range of 50% to 55%. Wall motion was normal; there were no regional wall motion abnormalities. - Aortic valve: There was no stenosis. There was trivial regurgitation. - Mitral valve: Mechanical mitral valve with normal function. There was trivial regurgitation. Mean gradient (D): 4 mm Hg. - Left atrium: The atrium was mildly to moderately dilated. - Right ventricle: The cavity size was normal. Pacer wire or catheter noted in right ventricle. Systolic function was normal. - Pulmonary arteries: No complete TR doppler jet so unable to estimate PA systolic pressure. - Inferior vena cava: The vessel was normal in size. The respirophasic diameter changes were in the normal range (>= 50%), consistent with normal central venous pressure.  Impressions:  - Normal LV size with mild LV hypertrophy. EF 50-55% (low normal to mildly reduced systolic function). Mechanical mitral valve appeared to have normal function. Normal RV size and systolic function.  ------------------------------------------------------------------- Labs, prior tests, procedures, and surgery: Mitral Valve Replacement-Sorin [employer] Optiform Bileaflet Mechanical Valve. LAA clipping.     Transthoracic echocardiography. M-mode, complete 2D, spectral Doppler, and color Doppler. Birthdate: Patient birthdate: 03-19-1956. Age: Patient is 59 yr old. Sex: Gender: male.  BMI: 27.8 kg/m^2. Blood pressure:   130/78 Patient status: Outpatient. Study date: Study date: 10/15/2014. Study time: 11:07 AM. Location:  [facility]  -------------------------------------------------------------------  ------------------------------------------------------------------- Left ventricle: The cavity size was normal. Wall thickness was increased in a pattern of mild LVH. Systolic function was low normal to mildly reduced. The estimated ejection fraction was in the range of 50% to 55%. Wall motion was normal; there were no regional wall motion abnormalities.  ------------------------------------------------------------------- Aortic valve:  Trileaflet; mildly calcified leaflets. Doppler: There was no stenosis.  There was trivial regurgitation.  ------------------------------------------------------------------- Aorta: Aortic root: The aortic root was normal in size. Ascending aorta: The ascending aorta was normal in size.  ------------------------------------------------------------------- Mitral valve: Mechanical mitral valve with normal function. Doppler: There was trivial regurgitation.  Valve area by pressure half-time: 2.82 cm^2. Indexed valve area by pressure half-time: 1.4 cm^2/m^2. Valve area by continuity equation (using LVOT flow): 1.48 cm^2. Indexed valve area by continuity equation (using LVOT flow): 0.74 cm^2/m^2.  Mean gradient (D): 4 mm Hg. Peak gradient (D): 9 mm Hg.  ------------------------------------------------------------------- Left atrium: The atrium was mildly to moderately dilated.  ------------------------------------------------------------------- Right ventricle: The cavity size was normal. Pacer wire or catheter noted in right ventricle. Systolic function was normal.  ------------------------------------------------------------------- Pulmonic valve:  Structurally normal valve.  Cusp separation was normal. Doppler: Transvalvular velocity was within the normal range. There was no  regurgitation.  ------------------------------------------------------------------- Tricuspid valve:  Doppler: There was no significant regurgitation.  ------------------------------------------------------------------- Pulmonary artery:  No complete TR doppler jet so unable to estimate PA systolic pressure.  ------------------------------------------------------------------- Right atrium: The atrium was normal in size.  ------------------------------------------------------------------- Pericardium: There was no pericardial effusion.  ------------------------------------------------------------------- Systemic veins: Inferior vena cava: The  vessel was normal in size. The respirophasic diameter changes were in the normal range (>= 50%), consistent with normal central venous pressure.  ------------------------------------------------------------------- Measurements  Left ventricle              Value     Reference LV ID, ED, PLAX chordal          46  mm    43 - 52 LV ID, ES, PLAX chordal          34  mm    23 - 38 LV fx shortening, PLAX chordal (L)    26  %    >=29 LV PW thickness, ED            11  mm    --------- IVS/LV PW ratio, ED            1.09      <=1.3 Stroke volume, 2D             51  ml    --------- Stroke volume/bsa, 2D           25  ml/m^2  --------- LV e&', lateral              7.33 cm/s   --------- LV E/e&', lateral             20.74     --------- LV e&', medial               6.67 cm/s   --------- LV E/e&', medial              22.79     --------- LV e&', average              7   cm/s   --------- LV E/e&', average             21.71     ---------  Ventricular septum            Value     Reference IVS  thickness, ED             12  mm    ---------  LVOT                   Value     Reference LVOT ID, S                21  mm    --------- LVOT area                 3.46 cm^2   --------- LVOT mean velocity, S           51.2 cm/s   --------- LVOT VTI, S                14.8 cm    ---------  Aortic valve               Value     Reference Aortic regurg pressure          636  ms    --------- half-time  Aorta                   Value     Reference Aortic root ID, ED            29  mm    ---------  Left atrium                Value     Reference LA ID,  A-P, ES              44  mm    --------- LA ID/bsa, A-P              2.19 cm/m^2  <=2.2 LA volume, S               62  ml    --------- LA volume/bsa, S             30.8 ml/m^2  --------- LA volume, ES, 1-p A4C          69.2 ml    --------- LA volume/bsa, ES, 1-p A4C        34.4 ml/m^2  --------- LA volume, ES, 1-p A2C          48.3 ml    --------- LA volume/bsa, ES, 1-p A2C        24  ml/m^2  ---------  Mitral valve               Value     Reference Mitral E-wave peak velocity        152  cm/s   --------- Mitral A-wave peak velocity        55.8 cm/s   --------- Mitral mean velocity, D          81.4 cm/s   --------- Mitral deceleration time    (H)    239  ms    150 - 230 Mitral pressure half-time         78  ms    --------- Mitral mean gradient, D          4   mm Hg  --------- Mitral peak gradient, D          9   mm Hg  --------- Mitral E/A ratio, peak          2.72       --------- Mitral valve area, PHT, DP        2.82 cm^2   --------- Mitral valve area/bsa, PHT, DP      1.4  cm^2/m^2 --------- Mitral valve area, LVOT          1.48 cm^2   --------- continuity Mitral valve area/bsa, LVOT        0.74 cm^2/m^2 --------- continuity Mitral annulus VTI, D           34.6 cm    ---------  Right ventricle              Value     Reference RV s&', lateral, S             12.3 cm/s   ---------  Legend: (L) and (H) mark values outside specified reference range.  ------------------------------------------------------------------- Prepared and Electronically Authenticated by  Loralie Champagne, M.D. [id number]:44:37   2 channel telemetry rhythm strip demonstrates normal sinus rhythm   Impression:  Patient is doing very well 6 months status post minimally invasive mitral valve replacement and Maze procedure.  He is maintaining sinus rhythm. Exercise tolerance is reportedly quite good.    Plan:  The patient will return for routine follow-up and rhythm check in 6 months.   I spent in excess of 15 minutes during the conduct of this office consultation and >50% of this time involved direct face-to-face encounter with the patient for counseling and/or coordination of their care.    Valentina Gu. Roxy Manns, MD 11/12/2014 11:05 AM

## 2014-11-12 NOTE — Patient Instructions (Signed)
The patient should continue all previous medications without changes at this time  

## 2014-11-12 NOTE — Patient Instructions (Signed)
Plantar Fasciitis (Heel Spur Syndrome) with Rehab The plantar fascia is a fibrous, ligament-like, soft-tissue structure that spans the bottom of the foot. Plantar fasciitis is a condition that causes pain in the foot due to inflammation of the tissue. SYMPTOMS   Pain and tenderness on the underneath side of the foot.  Pain that worsens with standing or walking. CAUSES  Plantar fasciitis is caused by irritation and injury to the plantar fascia on the underneath side of the foot. Common mechanisms of injury include:  Direct trauma to bottom of the foot.  Damage to a small nerve that runs under the foot where the main fascia attaches to the heel bone.  Stress placed on the plantar fascia due to bone spurs. RISK INCREASES WITH:   Activities that place stress on the plantar fascia (running, jumping, pivoting, or cutting).  Poor strength and flexibility.  Improperly fitted shoes.  Tight calf muscles.  Flat feet.  Failure to warm-up properly before activity.  Obesity. PREVENTION  Warm up and stretch properly before activity.  Allow for adequate recovery between workouts.  Maintain physical fitness:  Strength, flexibility, and endurance.  Cardiovascular fitness.  Maintain a health body weight.  Avoid stress on the plantar fascia.  Wear properly fitted shoes, including arch supports for individuals who have flat feet. PROGNOSIS  If treated properly, then the symptoms of plantar fasciitis usually resolve without surgery. However, occasionally surgery is necessary. RELATED COMPLICATIONS   Recurrent symptoms that may result in a chronic condition.  Problems of the lower back that are caused by compensating for the injury, such as limping.  Pain or weakness of the foot during push-off following surgery.  Chronic inflammation, scarring, and partial or complete fascia tear, occurring more often from repeated injections. TREATMENT  Treatment initially involves the use of  ice and medication to help reduce pain and inflammation. The use of strengthening and stretching exercises may help reduce pain with activity, especially stretches of the Achilles tendon. These exercises may be performed at home or with a therapist. Your caregiver may recommend that you use heel cups of arch supports to help reduce stress on the plantar fascia. Occasionally, corticosteroid injections are given to reduce inflammation. If symptoms persist for greater than 6 months despite non-surgical (conservative), then surgery may be recommended.  MEDICATION   If pain medication is necessary, then nonsteroidal anti-inflammatory medications, such as aspirin and ibuprofen, or other minor pain relievers, such as acetaminophen, are often recommended.  Do not take pain medication within 7 days before surgery.  Prescription pain relievers may be given if deemed necessary by your caregiver. Use only as directed and only as much as you need.  Corticosteroid injections may be given by your caregiver. These injections should be reserved for the most serious cases, because they may only be given a certain number of times. HEAT AND COLD  Cold treatment (icing) relieves pain and reduces inflammation. Cold treatment should be applied for 10 to 15 minutes every 2 to 3 hours for inflammation and pain and immediately after any activity that aggravates your symptoms. Use ice packs or massage the area with a piece of ice (ice massage).  Heat treatment may be used prior to performing the stretching and strengthening activities prescribed by your caregiver, physical therapist, or athletic trainer. Use a heat pack or soak the injury in warm water. SEEK IMMEDIATE MEDICAL CARE IF:  Treatment seems to offer no benefit, or the condition worsens.  Any medications produce adverse side effects. EXERCISES RANGE   OF MOTION (ROM) AND STRETCHING EXERCISES - Plantar Fasciitis (Heel Spur Syndrome) These exercises may help you  when beginning to rehabilitate your injury. Your symptoms may resolve with or without further involvement from your physician, physical therapist or athletic trainer. While completing these exercises, remember:   Restoring tissue flexibility helps normal motion to return to the joints. This allows healthier, less painful movement and activity.  An effective stretch should be held for at least 30 seconds.  A stretch should never be painful. You should only feel a gentle lengthening or release in the stretched tissue. RANGE OF MOTION - Toe Extension, Flexion  Sit with your right / left leg crossed over your opposite knee.  Grasp your toes and gently pull them back toward the top of your foot. You should feel a stretch on the bottom of your toes and/or foot.  Hold this stretch for __________ seconds.  Now, gently pull your toes toward the bottom of your foot. You should feel a stretch on the top of your toes and or foot.  Hold this stretch for __________ seconds. Repeat __________ times. Complete this stretch __________ times per day.  RANGE OF MOTION - Ankle Dorsiflexion, Active Assisted  Remove shoes and sit on a chair that is preferably not on a carpeted surface.  Place right / left foot under knee. Extend your opposite leg for support.  Keeping your heel down, slide your right / left foot back toward the chair until you feel a stretch at your ankle or calf. If you do not feel a stretch, slide your bottom forward to the edge of the chair, while still keeping your heel down.  Hold this stretch for __________ seconds. Repeat __________ times. Complete this stretch __________ times per day.  STRETCH - Gastroc, Standing  Place hands on wall.  Extend right / left leg, keeping the front knee somewhat bent.  Slightly point your toes inward on your back foot.  Keeping your right / left heel on the floor and your knee straight, shift your weight toward the wall, not allowing your back to  arch.  You should feel a gentle stretch in the right / left calf. Hold this position for __________ seconds. Repeat __________ times. Complete this stretch __________ times per day. STRETCH - Soleus, Standing  Place hands on wall.  Extend right / left leg, keeping the other knee somewhat bent.  Slightly point your toes inward on your back foot.  Keep your right / left heel on the floor, bend your back knee, and slightly shift your weight over the back leg so that you feel a gentle stretch deep in your back calf.  Hold this position for __________ seconds. Repeat __________ times. Complete this stretch __________ times per day. STRETCH - Gastrocsoleus, Standing  Note: This exercise can place a lot of stress on your foot and ankle. Please complete this exercise only if specifically instructed by your caregiver.   Place the ball of your right / left foot on a step, keeping your other foot firmly on the same step.  Hold on to the wall or a rail for balance.  Slowly lift your other foot, allowing your body weight to press your heel down over the edge of the step.  You should feel a stretch in your right / left calf.  Hold this position for __________ seconds.  Repeat this exercise with a slight bend in your right / left knee. Repeat __________ times. Complete this stretch __________ times per day.    STRENGTHENING EXERCISES - Plantar Fasciitis (Heel Spur Syndrome)  These exercises may help you when beginning to rehabilitate your injury. They may resolve your symptoms with or without further involvement from your physician, physical therapist or athletic trainer. While completing these exercises, remember:   Muscles can gain both the endurance and the strength needed for everyday activities through controlled exercises.  Complete these exercises as instructed by your physician, physical therapist or athletic trainer. Progress the resistance and repetitions only as guided. STRENGTH -  Towel Curls  Sit in a chair positioned on a non-carpeted surface.  Place your foot on a towel, keeping your heel on the floor.  Pull the towel toward your heel by only curling your toes. Keep your heel on the floor.  If instructed by your physician, physical therapist or athletic trainer, add ____________________ at the end of the towel. Repeat __________ times. Complete this exercise __________ times per day. STRENGTH - Ankle Inversion  Secure one end of a rubber exercise band/tubing to a fixed object (table, pole). Loop the other end around your foot just before your toes.  Place your fists between your knees. This will focus your strengthening at your ankle.  Slowly, pull your big toe up and in, making sure the band/tubing is positioned to resist the entire motion.  Hold this position for __________ seconds.  Have your muscles resist the band/tubing as it slowly pulls your foot back to the starting position. Repeat __________ times. Complete this exercises __________ times per day.  Document Released: 03/09/2005 Document Revised: 06/01/2011 Document Reviewed: 06/21/2008 [employer] Patient Information 2015 [employer]. This information is not intended to replace advice given to you by your health care provider. Make sure you discuss any questions you have with your health care provider.  

## 2014-11-13 ENCOUNTER — Encounter: Payer: Self-pay | Admitting: Podiatry

## 2014-11-21 ENCOUNTER — Ambulatory Visit (INDEPENDENT_AMBULATORY_CARE_PROVIDER_SITE_OTHER): Payer: Managed Care, Other (non HMO) | Admitting: *Deleted

## 2014-11-21 DIAGNOSIS — I442 Atrioventricular block, complete: Secondary | ICD-10-CM

## 2014-11-21 NOTE — Progress Notes (Signed)
Remote pacemaker transmission.   

## 2014-11-30 LAB — CUP PACEART REMOTE DEVICE CHECK
Battery Impedance: 100 Ohm
Battery Remaining Longevity: 165 mo
Battery Voltage: 2.8 V
Brady Statistic AP VP Percent: 0 %
Brady Statistic AP VS Percent: 2 %
Date Time Interrogation Session: 20160831133148
Lead Channel Impedance Value: 607 Ohm
Lead Channel Pacing Threshold Amplitude: 0.875 V
Lead Channel Pacing Threshold Pulse Width: 0.4 ms
Lead Channel Setting Pacing Amplitude: 2 V
Lead Channel Setting Pacing Amplitude: 2.5 V
Lead Channel Setting Sensing Sensitivity: 4 mV
MDC IDC MSMT LEADCHNL RA PACING THRESHOLD PULSEWIDTH: 0.4 ms
MDC IDC MSMT LEADCHNL RA SENSING INTR AMPL: 2.8 mV
MDC IDC MSMT LEADCHNL RV IMPEDANCE VALUE: 678 Ohm
MDC IDC MSMT LEADCHNL RV PACING THRESHOLD AMPLITUDE: 0.625 V
MDC IDC MSMT LEADCHNL RV SENSING INTR AMPL: 11.2 mV
MDC IDC SET LEADCHNL RV PACING PULSEWIDTH: 0.4 ms
MDC IDC STAT BRADY AS VP PERCENT: 0 %
MDC IDC STAT BRADY AS VS PERCENT: 98 %

## 2014-12-05 ENCOUNTER — Other Ambulatory Visit: Payer: Self-pay | Admitting: Cardiology

## 2014-12-06 NOTE — Telephone Encounter (Signed)
Dorothy Spark, MD at 11/01/2014 9:40 AM  metoprolol tartrate (LOPRESSOR) 25 MG tabletTake 1 tablet (25 mg total) by mouth 2 (two) times daily Patient Instructions     Your physician recommends that you continue on your current medications as directed. Please refer to the Current Medication list given to you today.

## 2014-12-10 ENCOUNTER — Ambulatory Visit (INDEPENDENT_AMBULATORY_CARE_PROVIDER_SITE_OTHER): Payer: Managed Care, Other (non HMO) | Admitting: Podiatry

## 2014-12-10 ENCOUNTER — Encounter: Payer: Self-pay | Admitting: Podiatry

## 2014-12-10 DIAGNOSIS — M722 Plantar fascial fibromatosis: Secondary | ICD-10-CM | POA: Diagnosis not present

## 2014-12-10 NOTE — Progress Notes (Signed)
Patient ID: Kevin English, male   DOB: 1955-08-16, 59 y.o.   MRN: [id number]  Subjective: 59 year old male presents the office today for follow-up evaluation of right heel pain, plantar fasciitis. He states that since he initially was wearing the brace he had significant improvement in symptoms however the pain for surgery at her. Is requesting a steroid injection at this time. He has been stretching icing daily. He believes that this pain started after he started cardiac rehabilitation and increasing his activity. Denies any swelling or redness to the area. No tingling or numbness. No other complaints at this time.  Objective AAO x3, NAD DP/PT pulses palpable bilaterally, CRT less than 3 seconds Protective sensation intact with Simms Weinstein monofilament, vibratory sensation intact, Achilles tendon reflex intact There is continued tenderness to palpation overlying the plantar medial tubercle of the calcaneus to right heel at the insertion of the plantar fascia. There is no pain along the course of plantar fascial within the arch of the foot. There is no pain with lateral compression of the calcaneus or pain the vibratory sensation. No pain on the posterior aspect of the calcaneus or along the course/insertion of the Achilles tendon. There is no overlying edema, erythema, increase in warmth. No other areas of tenderness palpation or pain with vibratory sensation to the foot/ankle. MMT 5/5, ROM WNL No open lesions or pre-ulcerative lesions are identified. No pain with calf compression, swelling, warmth, erythema.  Assessment: 59 year old male right heel pain, plantar fasciitis which is continuing  Plan: -Treatment options discussed including all alternatives, risks, and complications -Patient elects to proceed with steroid injection into the right heel. Under sterile skin preparation, a total of 2.5cc of kenalog 10, 0.5% Marcaine plain, and 2% lidocaine plain were infiltrated into the symptomatic  area without complication. A band-aid was applied. Patient tolerated the injection well without complication. Post-injection care with discussed with the patient. Discussed with the patient to ice the area over the next couple of days to help prevent a steroid flare.  -Ice and stretching exercises daily. -Continue supportive shoe gear. Discussed orthotics. -Follow-up in 4 weeks or sooner if any problems arise. In the meantime, encouraged to call the office with any questions, concerns, change in symptoms.   Celesta Gentile, DPM

## 2014-12-10 NOTE — Patient Instructions (Signed)
Plantar Fasciitis (Heel Spur Syndrome) with Rehab The plantar fascia is a fibrous, ligament-like, soft-tissue structure that spans the bottom of the foot. Plantar fasciitis is a condition that causes pain in the foot due to inflammation of the tissue. SYMPTOMS   Pain and tenderness on the underneath side of the foot.  Pain that worsens with standing or walking. CAUSES  Plantar fasciitis is caused by irritation and injury to the plantar fascia on the underneath side of the foot. Common mechanisms of injury include:  Direct trauma to bottom of the foot.  Damage to a small nerve that runs under the foot where the main fascia attaches to the heel bone.  Stress placed on the plantar fascia due to bone spurs. RISK INCREASES WITH:   Activities that place stress on the plantar fascia (running, jumping, pivoting, or cutting).  Poor strength and flexibility.  Improperly fitted shoes.  Tight calf muscles.  Flat feet.  Failure to warm-up properly before activity.  Obesity. PREVENTION  Warm up and stretch properly before activity.  Allow for adequate recovery between workouts.  Maintain physical fitness:  Strength, flexibility, and endurance.  Cardiovascular fitness.  Maintain a health body weight.  Avoid stress on the plantar fascia.  Wear properly fitted shoes, including arch supports for individuals who have flat feet. PROGNOSIS  If treated properly, then the symptoms of plantar fasciitis usually resolve without surgery. However, occasionally surgery is necessary. RELATED COMPLICATIONS   Recurrent symptoms that may result in a chronic condition.  Problems of the lower back that are caused by compensating for the injury, such as limping.  Pain or weakness of the foot during push-off following surgery.  Chronic inflammation, scarring, and partial or complete fascia tear, occurring more often from repeated injections. TREATMENT  Treatment initially involves the use of  ice and medication to help reduce pain and inflammation. The use of strengthening and stretching exercises may help reduce pain with activity, especially stretches of the Achilles tendon. These exercises may be performed at home or with a therapist. Your caregiver may recommend that you use heel cups of arch supports to help reduce stress on the plantar fascia. Occasionally, corticosteroid injections are given to reduce inflammation. If symptoms persist for greater than 6 months despite non-surgical (conservative), then surgery may be recommended.  MEDICATION   If pain medication is necessary, then nonsteroidal anti-inflammatory medications, such as aspirin and ibuprofen, or other minor pain relievers, such as acetaminophen, are often recommended.  Do not take pain medication within 7 days before surgery.  Prescription pain relievers may be given if deemed necessary by your caregiver. Use only as directed and only as much as you need.  Corticosteroid injections may be given by your caregiver. These injections should be reserved for the most serious cases, because they may only be given a certain number of times. HEAT AND COLD  Cold treatment (icing) relieves pain and reduces inflammation. Cold treatment should be applied for 10 to 15 minutes every 2 to 3 hours for inflammation and pain and immediately after any activity that aggravates your symptoms. Use ice packs or massage the area with a piece of ice (ice massage).  Heat treatment may be used prior to performing the stretching and strengthening activities prescribed by your caregiver, physical therapist, or athletic trainer. Use a heat pack or soak the injury in warm water. SEEK IMMEDIATE MEDICAL CARE IF:  Treatment seems to offer no benefit, or the condition worsens.  Any medications produce adverse side effects. EXERCISES RANGE   OF MOTION (ROM) AND STRETCHING EXERCISES - Plantar Fasciitis (Heel Spur Syndrome) These exercises may help you  when beginning to rehabilitate your injury. Your symptoms may resolve with or without further involvement from your physician, physical therapist or athletic trainer. While completing these exercises, remember:   Restoring tissue flexibility helps normal motion to return to the joints. This allows healthier, less painful movement and activity.  An effective stretch should be held for at least 30 seconds.  A stretch should never be painful. You should only feel a gentle lengthening or release in the stretched tissue. RANGE OF MOTION - Toe Extension, Flexion  Sit with your right / left leg crossed over your opposite knee.  Grasp your toes and gently pull them back toward the top of your foot. You should feel a stretch on the bottom of your toes and/or foot.  Hold this stretch for __________ seconds.  Now, gently pull your toes toward the bottom of your foot. You should feel a stretch on the top of your toes and or foot.  Hold this stretch for __________ seconds. Repeat __________ times. Complete this stretch __________ times per day.  RANGE OF MOTION - Ankle Dorsiflexion, Active Assisted  Remove shoes and sit on a chair that is preferably not on a carpeted surface.  Place right / left foot under knee. Extend your opposite leg for support.  Keeping your heel down, slide your right / left foot back toward the chair until you feel a stretch at your ankle or calf. If you do not feel a stretch, slide your bottom forward to the edge of the chair, while still keeping your heel down.  Hold this stretch for __________ seconds. Repeat __________ times. Complete this stretch __________ times per day.  STRETCH - Gastroc, Standing  Place hands on wall.  Extend right / left leg, keeping the front knee somewhat bent.  Slightly point your toes inward on your back foot.  Keeping your right / left heel on the floor and your knee straight, shift your weight toward the wall, not allowing your back to  arch.  You should feel a gentle stretch in the right / left calf. Hold this position for __________ seconds. Repeat __________ times. Complete this stretch __________ times per day. STRETCH - Soleus, Standing  Place hands on wall.  Extend right / left leg, keeping the other knee somewhat bent.  Slightly point your toes inward on your back foot.  Keep your right / left heel on the floor, bend your back knee, and slightly shift your weight over the back leg so that you feel a gentle stretch deep in your back calf.  Hold this position for __________ seconds. Repeat __________ times. Complete this stretch __________ times per day. STRETCH - Gastrocsoleus, Standing  Note: This exercise can place a lot of stress on your foot and ankle. Please complete this exercise only if specifically instructed by your caregiver.   Place the ball of your right / left foot on a step, keeping your other foot firmly on the same step.  Hold on to the wall or a rail for balance.  Slowly lift your other foot, allowing your body weight to press your heel down over the edge of the step.  You should feel a stretch in your right / left calf.  Hold this position for __________ seconds.  Repeat this exercise with a slight bend in your right / left knee. Repeat __________ times. Complete this stretch __________ times per day.    STRENGTHENING EXERCISES - Plantar Fasciitis (Heel Spur Syndrome)  These exercises may help you when beginning to rehabilitate your injury. They may resolve your symptoms with or without further involvement from your physician, physical therapist or athletic trainer. While completing these exercises, remember:   Muscles can gain both the endurance and the strength needed for everyday activities through controlled exercises.  Complete these exercises as instructed by your physician, physical therapist or athletic trainer. Progress the resistance and repetitions only as guided. STRENGTH -  Towel Curls  Sit in a chair positioned on a non-carpeted surface.  Place your foot on a towel, keeping your heel on the floor.  Pull the towel toward your heel by only curling your toes. Keep your heel on the floor.  If instructed by your physician, physical therapist or athletic trainer, add ____________________ at the end of the towel. Repeat __________ times. Complete this exercise __________ times per day. STRENGTH - Ankle Inversion  Secure one end of a rubber exercise band/tubing to a fixed object (table, pole). Loop the other end around your foot just before your toes.  Place your fists between your knees. This will focus your strengthening at your ankle.  Slowly, pull your big toe up and in, making sure the band/tubing is positioned to resist the entire motion.  Hold this position for __________ seconds.  Have your muscles resist the band/tubing as it slowly pulls your foot back to the starting position. Repeat __________ times. Complete this exercises __________ times per day.  Document Released: 03/09/2005 Document Revised: 06/01/2011 Document Reviewed: 06/21/2008 [employer] Patient Information 2015 [employer]. This information is not intended to replace advice given to you by your health care provider. Make sure you discuss any questions you have with your health care provider.  

## 2014-12-11 ENCOUNTER — Encounter: Payer: Self-pay | Admitting: Cardiology

## 2014-12-12 ENCOUNTER — Other Ambulatory Visit: Payer: Self-pay | Admitting: Cardiology

## 2014-12-13 ENCOUNTER — Ambulatory Visit (INDEPENDENT_AMBULATORY_CARE_PROVIDER_SITE_OTHER): Payer: Managed Care, Other (non HMO) | Admitting: *Deleted

## 2014-12-13 ENCOUNTER — Other Ambulatory Visit (INDEPENDENT_AMBULATORY_CARE_PROVIDER_SITE_OTHER): Payer: Managed Care, Other (non HMO)

## 2014-12-13 DIAGNOSIS — I4891 Unspecified atrial fibrillation: Secondary | ICD-10-CM

## 2014-12-13 DIAGNOSIS — Z954 Presence of other heart-valve replacement: Secondary | ICD-10-CM | POA: Diagnosis not present

## 2014-12-13 DIAGNOSIS — E785 Hyperlipidemia, unspecified: Secondary | ICD-10-CM

## 2014-12-13 DIAGNOSIS — Z5181 Encounter for therapeutic drug level monitoring: Secondary | ICD-10-CM

## 2014-12-13 DIAGNOSIS — I050 Rheumatic mitral stenosis: Secondary | ICD-10-CM

## 2014-12-13 DIAGNOSIS — I5033 Acute on chronic diastolic (congestive) heart failure: Secondary | ICD-10-CM | POA: Diagnosis not present

## 2014-12-13 LAB — HEPATIC FUNCTION PANEL
ALT: 25 U/L (ref 0–53)
AST: 20 U/L (ref 0–37)
Albumin: 4.1 g/dL (ref 3.5–5.2)
Alkaline Phosphatase: 96 U/L (ref 39–117)
Bilirubin, Direct: 0.1 mg/dL (ref 0.0–0.3)
Total Bilirubin: 0.4 mg/dL (ref 0.2–1.2)
Total Protein: 7.3 g/dL (ref 6.0–8.3)

## 2014-12-13 LAB — LDL CHOLESTEROL, DIRECT: Direct LDL: 62 mg/dL

## 2014-12-13 LAB — LIPID PANEL
Cholesterol: 194 mg/dL (ref 0–200)
HDL: 20.9 mg/dL — ABNORMAL LOW (ref >39.00)
Total CHOL/HDL Ratio: 9
Triglycerides: 603 mg/dL — ABNORMAL HIGH (ref 0.0–149.0)

## 2014-12-13 LAB — POCT INR: INR: 2.2

## 2014-12-18 ENCOUNTER — Telehealth: Payer: Self-pay | Admitting: *Deleted

## 2014-12-18 DIAGNOSIS — E785 Hyperlipidemia, unspecified: Secondary | ICD-10-CM

## 2014-12-18 MED ORDER — OMEGA-3-ACID ETHYL ESTERS 1 G PO CAPS: 2.0000 g | ORAL_CAPSULE | Freq: Two times a day (BID) | ORAL | Status: DC

## 2014-12-18 NOTE — Telephone Encounter (Signed)
-----   Message from Dorothy Spark, MD sent at 12/14/2014 10:02 AM EDT ----- His LDL is excellent, but triglycerides are very elevated. I would start Lovaza 4 mg po daily and recheck lipids and CMP in 3 months. Thank you, K  ----- Message -----    From: Aris Georgia, [facility]    Sent: 12/14/2014   9:41 AM      To: Dorothy Spark, MD, Nuala Alpha, LPN  I would try Lovaza 4 gm per day to help treat his elevated TG.  If this doesn't work, may try Niacin but given his history of intolerances, will try option with fewer side effects first.  Encourage patient to limit sugars, carbohydrates and alcohol.   Recheck labs in 3 months.

## 2014-12-18 NOTE — Telephone Encounter (Signed)
Notified the pt and wife Santiago Glad, that per Dr [name] and Elberta Leatherwood PharmD, the pts labs showed that his LDL is excellent, but triglycerides are very elevated.  Informed the pt and wife that per Dr [name] and Gay Filler, they recommend the pt take Lovaza 4 gm po daily, and recheck lipids and cmet in 3 months.  Scheduled the pts repeat labs to check lipids and cmet for 03/19/15 at our office.  Pt and wife aware that he should come fasting to this appointment.  Confirmed the pharmacy of choice with the pt and wife.   Informed the pt and wife that with taking  Lovaza 4 grams po daily, he will be taking 4 pills of the 1 gram Lovaza to equal 4 grams per day. Both verbalized understanding and agrees with this plan.

## 2014-12-26 ENCOUNTER — Encounter: Payer: Self-pay | Admitting: Internal Medicine

## 2014-12-31 ENCOUNTER — Ambulatory Visit (INDEPENDENT_AMBULATORY_CARE_PROVIDER_SITE_OTHER): Payer: Managed Care, Other (non HMO) | Admitting: Podiatry

## 2014-12-31 VITALS — BP 141/86 | HR 60 | Resp 18

## 2014-12-31 DIAGNOSIS — M79671 Pain in right foot: Secondary | ICD-10-CM

## 2014-12-31 DIAGNOSIS — M722 Plantar fascial fibromatosis: Secondary | ICD-10-CM | POA: Diagnosis not present

## 2014-12-31 MED ORDER — TRIAMCINOLONE ACETONIDE 10 MG/ML IJ SUSP
10.0000 mg | Freq: Once | INTRAMUSCULAR | Status: AC
  Administered 2014-12-31: 10 mg

## 2014-12-31 NOTE — Patient Instructions (Signed)

## 2015-01-01 ENCOUNTER — Encounter: Payer: Self-pay | Admitting: Podiatry

## 2015-01-01 NOTE — Progress Notes (Signed)
Patient ID: Kevin English, male   DOB: [birth year], 59 y.o.   MRN: [id number]  Subjective: 59 year old male presents the office in follow-up evaluation of right heel pain, plantar fasciitis. He states that his pain is gone from severe to mild to his heel. He is requesting another steroid injection this time. He has pain with stretching and as well as heat to the area. He did buy new shoes as well as inserts. Denies any numbness or tingling. No other complaints at this time. The pain does not awaken him at night.  Objective: AAO x3, NAD DP/PT pulses palpable bilaterally, CRT less than 3 seconds Protective sensation intact with Simms Weinstein monofilament, vibratory sensation intact, Achilles tendon reflex intact There is mild tenderness to palpation overlying the plantar medial tubercle of the calcaneus to the right heel at the insertion of the plantar fascia. There is no pain along the course of plantar fascia within the arch of the foot. There is no pain with lateral compression of the calcaneus or pain the vibratory sensation. No pain on the posterior aspect of the calcaneus or along the course/insertion of the Achilles tendon. There is no overlying edema, erythema, increase in warmth. No other areas of tenderness palpation or pain with vibratory sensation to the foot/ankle.  No open lesions or pre-ulcerative lesions are identified. No pain with calf compression, swelling, warmth, erythema.  Assessment:  59 year old male with right heel pain, resolving plantar fasciitis  Plan: -Treatment options discussed including all alternatives, risks, and complications -Patient elects to proceed with steroid injection into the right heel. Under sterile skin preparation, a total of 2.5cc of kenalog 10, 0.5% Marcaine plain, and 2% lidocaine plain were infiltrated into the symptomatic area without complication. A band-aid was applied. Patient tolerated the injection well without complication. Post-injection care  with discussed with the patient. Discussed with the patient to ice the area over the next couple of days to help prevent a steroid flare.  -Continue stretching icing activities daily. Continue supportive shoe gear and orthotics. If symptoms persist discussed possible physical therapy however we will hold off on that at this time.  Celesta Gentile, DPM

## 2015-01-17 ENCOUNTER — Ambulatory Visit (INDEPENDENT_AMBULATORY_CARE_PROVIDER_SITE_OTHER): Payer: Managed Care, Other (non HMO) | Admitting: Pharmacist

## 2015-01-17 DIAGNOSIS — I5033 Acute on chronic diastolic (congestive) heart failure: Secondary | ICD-10-CM | POA: Diagnosis not present

## 2015-01-17 DIAGNOSIS — Z954 Presence of other heart-valve replacement: Secondary | ICD-10-CM

## 2015-01-17 DIAGNOSIS — Z5181 Encounter for therapeutic drug level monitoring: Secondary | ICD-10-CM | POA: Diagnosis not present

## 2015-01-17 DIAGNOSIS — I4891 Unspecified atrial fibrillation: Secondary | ICD-10-CM

## 2015-01-17 LAB — POCT INR: INR: 2.1

## 2015-01-28 ENCOUNTER — Ambulatory Visit (INDEPENDENT_AMBULATORY_CARE_PROVIDER_SITE_OTHER): Payer: Managed Care, Other (non HMO) | Admitting: Podiatry

## 2015-01-28 VITALS — BP 116/62 | HR 68 | Resp 18

## 2015-01-28 DIAGNOSIS — M722 Plantar fascial fibromatosis: Secondary | ICD-10-CM | POA: Diagnosis not present

## 2015-01-28 DIAGNOSIS — M79671 Pain in right foot: Secondary | ICD-10-CM

## 2015-01-29 ENCOUNTER — Encounter: Payer: Self-pay | Admitting: Podiatry

## 2015-01-29 NOTE — Progress Notes (Signed)
Patient ID: Kevin English, male   DOB: 1955/04/22, 59 y.o.   MRN: [id number]  Subjective: 59 year old male presents the office a palpable evaluation of right heel pain. He states he has good days and bad days but overall he feels that he is improving compared to what he was previously. He states he has more pain has been on his feet. The pain in the morning is resolving. No recent injury or trauma. No swelling or redness. No tingling or numbness. He does stretch and ice intermittently but he has discontinued that over the last week or so. He has purchased over-the-counter insert seemed to help. He states the plantar fascial brace hurts. No other complaints at this time in no acute changes.  Objective: AAO 3, NAD DP/PT pulses 2/4, CRT less than 3 seconds  Protective sensation intact with Simms Weinstein monofilament, negative Tinel sign There is mild tenderness up patient on the plantar medial tubercle of the calcaneus at the insertion of plantar fascial in the right foot. There is no pain on the course of plantar fascial in the arch of the foot. No pain with lateral compression of the calcaneus. No pain on the course last insertion of the Achilles tendon. No other areas of tenderness to bilateral lower extremities. MMT 5/5, ROM WNL No open lesions or pre-ulcer lesions identified bilaterally. There is no pain with calf compression, swelling, warmth, erythema.  Assessment: 59 year old male with resolving right heel pain, plantar fasciitis  Plan: -Treatment options discussed including all alternatives, risks, and complications -Patient elects to proceed with steroid injection into the right heel. Under sterile skin preparation, a total of 2.5cc of kenalog 10, 0.5% Marcaine plain, and 2% lidocaine plain were infiltrated into the symptomatic area without complication. A band-aid was applied. Patient tolerated the injection well without complication. Post-injection care with discussed with the patient.  Discussed with the patient to ice the area over the next couple of days to help prevent a steroid flare.  -Plantar fascial taping was applied. -Continue stretching and icing exercises daily. -Continue supportive shoe gear. Continue with inserts which he did not have today. -Follow-up in 4 weeks if symptoms continue or sooner if any problems arise. In the meantime, encouraged to call the office with any questions, concerns, change in symptoms.   Celesta Gentile, DPM

## 2015-02-18 ENCOUNTER — Ambulatory Visit (INDEPENDENT_AMBULATORY_CARE_PROVIDER_SITE_OTHER): Payer: Managed Care, Other (non HMO)

## 2015-02-18 DIAGNOSIS — Z954 Presence of other heart-valve replacement: Secondary | ICD-10-CM | POA: Diagnosis not present

## 2015-02-18 DIAGNOSIS — Z5181 Encounter for therapeutic drug level monitoring: Secondary | ICD-10-CM | POA: Diagnosis not present

## 2015-02-18 DIAGNOSIS — I5033 Acute on chronic diastolic (congestive) heart failure: Secondary | ICD-10-CM | POA: Diagnosis not present

## 2015-02-18 DIAGNOSIS — I4891 Unspecified atrial fibrillation: Secondary | ICD-10-CM | POA: Diagnosis not present

## 2015-02-18 LAB — POCT INR: INR: 2

## 2015-02-21 ENCOUNTER — Ambulatory Visit (INDEPENDENT_AMBULATORY_CARE_PROVIDER_SITE_OTHER): Payer: Managed Care, Other (non HMO) | Admitting: *Deleted

## 2015-02-21 ENCOUNTER — Telehealth: Payer: Self-pay | Admitting: Cardiology

## 2015-02-21 DIAGNOSIS — I442 Atrioventricular block, complete: Secondary | ICD-10-CM

## 2015-02-21 NOTE — Telephone Encounter (Signed)
LMOVM reminding pt to send remote transmission.   

## 2015-02-22 NOTE — Progress Notes (Signed)
Remote pacemaker transmission.   

## 2015-02-28 LAB — CUP PACEART REMOTE DEVICE CHECK
Battery Impedance: 100 Ohm
Battery Remaining Longevity: 165 mo
Battery Voltage: 2.79 V
Implantable Lead Implant Date: 20160301
Implantable Lead Location: 753860
Implantable Lead Model: 5076
Implantable Lead Model: 5076
Lead Channel Impedance Value: 695 Ohm
MDC IDC LEAD IMPLANT DT: 20160301
MDC IDC LEAD LOCATION: 753859
MDC IDC MSMT LEADCHNL RA IMPEDANCE VALUE: 542 Ohm
MDC IDC SESS DTM: 20161201194035
MDC IDC SET LEADCHNL RA PACING AMPLITUDE: 2 V
MDC IDC SET LEADCHNL RV PACING AMPLITUDE: 2.5 V
MDC IDC SET LEADCHNL RV PACING PULSEWIDTH: 0.4 ms
MDC IDC SET LEADCHNL RV SENSING SENSITIVITY: 4 mV
MDC IDC STAT BRADY AP VP PERCENT: 0 %
MDC IDC STAT BRADY AP VS PERCENT: 2 %
MDC IDC STAT BRADY AS VP PERCENT: 0 %
MDC IDC STAT BRADY AS VS PERCENT: 97 %

## 2015-03-04 ENCOUNTER — Other Ambulatory Visit: Payer: Self-pay | Admitting: Cardiology

## 2015-03-05 ENCOUNTER — Encounter: Payer: Self-pay | Admitting: Cardiology

## 2015-03-05 NOTE — Telephone Encounter (Signed)
Will route this refill to Dr [name] to advise on this medication.

## 2015-03-06 ENCOUNTER — Ambulatory Visit: Payer: Managed Care, Other (non HMO) | Admitting: Cardiology

## 2015-03-08 ENCOUNTER — Ambulatory Visit (INDEPENDENT_AMBULATORY_CARE_PROVIDER_SITE_OTHER): Payer: Managed Care, Other (non HMO) | Admitting: *Deleted

## 2015-03-08 ENCOUNTER — Encounter: Payer: Self-pay | Admitting: Cardiology

## 2015-03-08 ENCOUNTER — Ambulatory Visit (INDEPENDENT_AMBULATORY_CARE_PROVIDER_SITE_OTHER): Payer: Managed Care, Other (non HMO) | Admitting: Cardiology

## 2015-03-08 DIAGNOSIS — Z5181 Encounter for therapeutic drug level monitoring: Secondary | ICD-10-CM | POA: Diagnosis not present

## 2015-03-08 DIAGNOSIS — Z9889 Other specified postprocedural states: Secondary | ICD-10-CM | POA: Diagnosis not present

## 2015-03-08 DIAGNOSIS — E038 Other specified hypothyroidism: Secondary | ICD-10-CM

## 2015-03-08 DIAGNOSIS — I5033 Acute on chronic diastolic (congestive) heart failure: Secondary | ICD-10-CM

## 2015-03-08 DIAGNOSIS — Z954 Presence of other heart-valve replacement: Secondary | ICD-10-CM

## 2015-03-08 DIAGNOSIS — I10 Essential (primary) hypertension: Secondary | ICD-10-CM

## 2015-03-08 DIAGNOSIS — I4891 Unspecified atrial fibrillation: Secondary | ICD-10-CM

## 2015-03-08 DIAGNOSIS — I2510 Atherosclerotic heart disease of native coronary artery without angina pectoris: Secondary | ICD-10-CM

## 2015-03-08 DIAGNOSIS — I5032 Chronic diastolic (congestive) heart failure: Secondary | ICD-10-CM

## 2015-03-08 DIAGNOSIS — I2583 Coronary atherosclerosis due to lipid rich plaque: Secondary | ICD-10-CM

## 2015-03-08 DIAGNOSIS — Z8679 Personal history of other diseases of the circulatory system: Secondary | ICD-10-CM

## 2015-03-08 LAB — POCT INR: INR: 2.2

## 2015-03-08 NOTE — Progress Notes (Signed)
Patient ID: Kevin English, male   DOB: 07-Sep-1955, 59 y.o.   MRN: [id number]       Chief complain: Foot pain, myalgias  Cardiology Office Note   Date:  03/08/2015   ID:  Kevin English, DOB May 20, 1955, MRN [id number]  PCP:  Kevin Rile, MD  Cardiologist:  Kevin English   Electrophysiologist:  Kevin English   No chief complaint on file.    History of Present Illness: Kevin English is a 59 y.o. male with a hx of HTN, HL (intol to statins), former smoker, PAFib, Rheumatic heart disease with severe mitral stenosis, diastolic HF, depression, CKD, renal cell CA, OSA.  He has been on Coumadin.  He had been prepared for minimally invasive MVR and placed on Amiodarone.  However, he developed clear cell renal cell CA and underwent R nephrectomy.  He developed fatigue with beta-blocker and Bystolic was DC'd.    He was ultimately stabilized and admitted 2/23-3/5.  He underwent Minimally-Invasive Mitral Valve Replacement (Sorin Carbomedics Optiform bileaflet mechanical valve) and Maze Procedure with Left atrial lesion set using cryothermy and Clipping of Left Atrial Appendage with Dr. Roxy English.  Post op course was complicated by complete heart block.  He ultimately required implantation of a Medtronic Adapta L dual-chamber pacemaker.  He also developed HCAP and required IV antibiotics, a/c diastolic HF requiring diuresis.  He returns for FU.  The patient tells me that he feels "lousy." He is overall frustrated about his health, recent surgeries and need for pacemaker. He has dehiscence of his right groin wound that is being packed. His wife handles dressing changes. She tells me that it is improving. Overall, he feels that he is better. He is not walking that much. He feels short of breath at times. When I ask him further about this, he doesn't really think his breathing is any worse. His chest remains sore. This is improved. He describes NYHA 2b symptoms. He denies orthopnea, PND or edema. He wears  CPAP nightly. He had diarrhea in the hospital. C. difficile was negative. This is resolved. He denies fever, cough, wheezing. He denies melena or hematochezia. He denies vomiting or diarrhea. He tells me that his tolerance for just about anything is poor. His wife tells me that he gets frustrated easily. He is currently on Zoloft and Xanax.  03/08/15 - the patient is coming after 4 months, he seems to be depressed again, he is frustrated about slow progress, he was doing well until recently when he developed plantar fasciitis with inability to walk requiring injections. He denies chest pain, DOE, LE edema, orthopnea, PND. HE is overall very thankful for everything we have done for him. His daughter just graduated from college and another one is getting married in may. PM was interrogated and functioning properly.    Studies/Reports Reviewed Today:  Carotid US 04/16/14 Bilateral - 1% to 39% ICA stensosis.  TEE [id number] - Systolic function was normal. Wall motion was normal - Aortic valve: There was mild regurgitation. - Mitral valve: Valve area by pressure half-time: 1.4 cm^2. - Left atrium: The atrium was moderately dilated.  Impressions:  Rheumatic mitral valve with moderate leaflet tip thickening and minimal calcifications. Moderate mitral stenosis and trace mitral regurgitation.  Cardiac cath 08/28/13 LM:  Normal LAD:  Proximal 40-50%, distal 50-60% LCx:  Small OM 70-80% (not amenable to PCI or CABG), PDA 70% LPDA:  10-20% RCA:  Mid 30-40%   Past Medical History  Diagnosis Date  . Hernia   .  Chronic rhinitis   . Diverticulitis of colon 15 years ago  . Hyperlipidemia   . Hypogonadism male   . Obesity   . Hypothyroidism   . Persistent atrial fibrillation ([place])     a. sustained 160-180 on e-CARDIO monitor placed 07/29/2013. Placed on IV amiodarone at end of May 2015 due to continued paroxysms with RVR.  Marland Kitchen Rheumatic heart disease   . Mitral stenosis     a. Dx 07/2013 - through workup  of 2D echo, TEE, and cath, felt to be moderate.  . Paroxysmal atrial flutter ([place])   . CAD (coronary artery disease)     a. By cath 08/2013: 70% distal cx-prox LPDA; tandem mod LAD lesions, o/w mild dz.  . Renal mass     a. Dx 08/2013: concerning for renal cancer.  Marland Kitchen HTN (hypertension)     hx of- under control  . Stroke [facility]) 1998    "MRI showed 3 mild strokes"  . Borderline diabetes   . Depression   . Anxiety   . Dizzy spells   . Deafness in left ear   . Lung nodule seen on imaging study 04/10/2014    5 mm nodule right lower lobe  . OSA on CPAP     cpap  . CKD (chronic kidney disease), stage II     his creat. runs 2-2.5; hasnt seen neph yet - sees dr. Tresa Moore at [facility]  . Renal cell carcinoma of right kidney ([place]) 09/05/2013    clear cell renal cell carcinoma of right kidney treated by radical nephrectomy, Fuhrman grade 2-3, with maximum tumor diameter 2.7 cm, all tumor to find confined to the kidney, and all surgical margins negative ([id number]).   . Yeast dermatitis 04/19/2014  . History of shingles   . S/P minimally invasive mitral valve replacement with metallic valve and maze procedure 05/15/2014    21mm Sorin Carbomedics Optiform mechanical valve placed via right mini thoracotomy approach  . S/P Minimally invasive maze operation for atrial fibrillation 05/15/2014    Left side lesion set using cryothermy with clipping of LA appendage  . Pacemaker 05/22/2014    for complete heart block  . Hx of echocardiogram     post MVR >> Echo 4/16:  Mild LVH, EF 45-50%, apical dyskinesis, trivial AI, mechanical MVR functioning appropriately, severe LAE    Past Surgical History  Procedure Laterality Date  . Tee without cardioversion N/A 08/16/2013    Procedure: TRANSESOPHAGEAL ECHOCARDIOGRAM (TEE);  Surgeon: Kevin Spark, MD;  Location: [place];  Service: Cardiovascular;  Laterality: N/A;  . Inguinal hernia repair Bilateral first one in 80's    "I had one side done twice"  . Cardiac  catheterization  08/28/13  . Eye surgery Right 1990    "reconstructive, lens implant- from paintball accident"  . Robot assisted laparoscopic nephrectomy Right 09/05/2013    Procedure: ROBOTIC ASSISTED LAPAROSCOPIC RIGHT RADICAL NEPHRECTOMY;  Surgeon: Sharyn Creamer, MD;  Location: [facility];  Service: Urology;  Laterality: Right;  . Left and right heart catheterization with coronary angiogram N/A 08/28/2013    Procedure: LEFT AND RIGHT HEART CATHETERIZATION WITH CORONARY ANGIOGRAM;  Surgeon: Leonie Man, MD;  Location[facility] LAB;  Service: Cardiovascular;  Laterality: N/A;  . Mitral valve replacement Right 05/15/2014    Procedure: MINIMALLY INVASIVE MITRAL VALVE (MV) REPLACEMENT;  Surgeon: Rexene Alberts, MD;  Location: [place];  Service: Open Heart Surgery;  Laterality: Right;  . Minimally invasive maze procedure N/A 05/15/2014  Procedure: MINIMALLY INVASIVE MAZE PROCEDURE;  Surgeon: Rexene Alberts, MD;  Location: [place];  Service: Open Heart Surgery;  Laterality: N/A;  . Tee without cardioversion N/A 05/15/2014    Procedure: TRANSESOPHAGEAL ECHOCARDIOGRAM (TEE);  Surgeon: Rexene Alberts, MD;  Location: [place];  Service: Open Heart Surgery;  Laterality: N/A;  . Permanent pacemaker insertion Left 05/22/2014    Procedure: PERMANENT PACEMAKER INSERTION;  Surgeon: Kevin Grayer, MD;  Location: [facility] CATH LAB;  Service: Cardiovascular;  Laterality: Left;     Current Outpatient Prescriptions  Medication Sig Dispense Refill  . acetaminophen (TYLENOL) 325 MG tablet Take 325 mg by mouth every 6 (six) hours as needed (pain).     Marland Kitchen ALPRAZolam (XANAX) 0.5 MG tablet Take 0.5-1 mg by mouth at bedtime as needed for anxiety.     Marland Kitchen amLODipine (NORVASC) 2.5 MG tablet TAKE 1 TABLET EVERY DAY 60 tablet 3  . aspirin EC 81 MG tablet Take 1 tablet (81 mg total) by mouth daily.    Marland Kitchen levothyroxine (SYNTHROID, LEVOTHROID) 75 MCG tablet TAKE 1 TABLET EVERY DAY BEFORE BREAKFAST 90 tablet 1  . metoprolol tartrate (LOPRESSOR)  25 MG tablet TAKE 1 TABLET TWICE DAILY 90 tablet 3  . omega-3 acid ethyl esters (LOVAZA) 1 G capsule Take 2 capsules (2 g total) by mouth 2 (two) times daily. 360 capsule 3  . sertraline (ZOLOFT) 50 MG tablet Take 1 tablet (50 mg total) by mouth daily. 90 tablet 4  . warfarin (COUMADIN) 5 MG tablet Take as directed by coumadin clinic 30 tablet 3   No current facility-administered medications for this visit.    Allergies:   Contrast media; Atorvastatin; Crestor; Fenofibrate; and Pravastatin    Social History:  The patient  reports that he quit smoking about 9 months ago. His smoking use included Cigarettes. He has a 10 pack-year smoking history. He has never used smokeless tobacco. He reports that he does not drink alcohol or use illicit drugs.   Family History:  The patient's family history includes Heart attack in his mother; Hypertension in his mother; Stroke in his mother.    ROS:   Please see the history of present illness.   Review of Systems  Cardiovascular: Positive for dyspnea on exertion.  All other systems reviewed and are negative.    PHYSICAL EXAM: VS:  BP 128/78 mmHg  Pulse 60  Ht 5\' 8"  (1.727 m)  Wt 190 lb (86.183 kg)  BMI 28.90 kg/m2    Wt Readings from Last 3 Encounters:  03/08/15 190 lb (86.183 kg)  11/12/14 186 lb (84.369 kg)  11/01/14 186 lb (84.369 kg)     GEN: Well nourished, well developed, in no acute distress HEENT: normal Neck: no JVD, no masses Cardiac:  Mechanical S1/Normal S2, RRR; no murmur ,  no rubs or gallops, no edema  Chest: Right chest incision well healed without erythema or discharge Respiratory:  Decreased breath sounds bilaterally, no wheezing, rhonchi or rales. GI: soft, nontender, nondistended, + BS MS: no deformity or atrophy Skin: warm and dry  Neuro:  CNs II-XII intact, Strength and sensation are intact Psych: Normal affect  EKG:  EKG is ordered today.  It demonstrates:   NSR, HR 64, V paced  Recent Labs: 05/16/2014:  Magnesium 2.7* 05/19/2014: B Natriuretic Peptide 309.5* 05/26/2014: Hemoglobin 13.5; Platelets 392 07/11/2014: BUN 17; Creatinine, Ser 1.81*; Potassium 4.2; Pro B Natriuretic peptide (BNP) 54.0; Sodium 139 12/13/2014: ALT 25   Lipid Panel    Component Value Date/Time  CHOL 194 12/13/2014 0845   TRIG * 12/13/2014 0845    603.0 Triglyceride is over 400; calculations on Lipids are invalid.   HDL 20.90* 12/13/2014 0845   CHOLHDL 9 12/13/2014 0845   VLDL 57.6* 08/22/2014 1209   LDLDIRECT 62.0 12/13/2014 0845      ASSESSMENT AND PLAN:  1. Mitral stenosis S/P minimally invasive mitral valve replacement with metallic valve and maze procedure  -  Completed cardiac rehabilitation, encouraged to continue exercising minimum 3x/week  -  SBE prophylaxis card was given.   -  The last echo showed normally functioning mitral valve and improved LVEF 45-50% --> 50-55%.  -  Coumadin managed in our Coumadin clinic.  2. LV dysfunction - LVEF 45-50% --> 50-55%, no ACEI/ARB is he is post nephrectomy, he is euvolemic, no lasix.  3. Paroxysmal atrial fibrillation Maintaining NSR. Continue beta blocker, Coumadin.  4. Essential hypertension Blood pressure controlled on current regimen.  5. Hyperlipidemia He is intolerant to statins (rosuva, prava, atorva and fenofibrate), he was denied PCSK 9 inhibitors, Gay Filler [place] follows.   6. Coronary artery disease involving native coronary artery of native heart without angina pectoris No apparent angina. If he continues to have issues with shortness of breath, we could try to initiate nitrates. He did have some moderate severe disease in a small OM branch that was not amenable to PCI. Continue aspirin, beta blocker, lipids followed by Elberta Leatherwood.  7. CHB (complete heart block) S/P placement of cardiac pacemaker, not paced today. Follow up with EP as planned.  8. Sleep apnea Continue CPAP.  Renal cell carcinoma of right kidney-nephrectomy June 2015 Follow-up  with urology as planned  CKD (chronic kidney disease), stage II Followed by [facility] .  Depression  Improved on Sertraline.   Follow up in 6 months.  Kevin English 03/08/2015

## 2015-03-08 NOTE — Patient Instructions (Signed)
Medication Instructions:   Your physician recommends that you continue on your current medications as directed. Please refer to the Current Medication list given to you today.   Labwork:  COME IN AS SCHEDULED ON 03/19/15 FOR YOUR LAB APPOINTMENT---WE ADDED A TSH AND CBC W DIFF TO THIS SCHEDULED LAB    Follow-Up:  Your physician wants you to follow-up in: [place] will receive a reminder letter in the mail two months in advance. If you don't receive a letter, please call our office to schedule the follow-up appointment.     If you need a refill on your cardiac medications before your next appointment, please call your pharmacy.

## 2015-03-09 DIAGNOSIS — I2510 Atherosclerotic heart disease of native coronary artery without angina pectoris: Secondary | ICD-10-CM | POA: Insufficient documentation

## 2015-03-09 DIAGNOSIS — I2583 Coronary atherosclerosis due to lipid rich plaque: Secondary | ICD-10-CM

## 2015-03-09 DIAGNOSIS — I10 Essential (primary) hypertension: Secondary | ICD-10-CM | POA: Insufficient documentation

## 2015-03-09 DIAGNOSIS — I5032 Chronic diastolic (congestive) heart failure: Secondary | ICD-10-CM | POA: Insufficient documentation

## 2015-03-19 ENCOUNTER — Other Ambulatory Visit (INDEPENDENT_AMBULATORY_CARE_PROVIDER_SITE_OTHER): Payer: Managed Care, Other (non HMO) | Admitting: *Deleted

## 2015-03-19 ENCOUNTER — Ambulatory Visit (INDEPENDENT_AMBULATORY_CARE_PROVIDER_SITE_OTHER): Payer: Managed Care, Other (non HMO) | Admitting: Pharmacist

## 2015-03-19 DIAGNOSIS — E038 Other specified hypothyroidism: Secondary | ICD-10-CM

## 2015-03-19 DIAGNOSIS — Z954 Presence of other heart-valve replacement: Secondary | ICD-10-CM | POA: Diagnosis not present

## 2015-03-19 DIAGNOSIS — E785 Hyperlipidemia, unspecified: Secondary | ICD-10-CM

## 2015-03-19 DIAGNOSIS — I4891 Unspecified atrial fibrillation: Secondary | ICD-10-CM

## 2015-03-19 DIAGNOSIS — Z5181 Encounter for therapeutic drug level monitoring: Secondary | ICD-10-CM

## 2015-03-19 DIAGNOSIS — Z9889 Other specified postprocedural states: Secondary | ICD-10-CM | POA: Diagnosis not present

## 2015-03-19 DIAGNOSIS — Z8679 Personal history of other diseases of the circulatory system: Secondary | ICD-10-CM

## 2015-03-19 DIAGNOSIS — I5033 Acute on chronic diastolic (congestive) heart failure: Secondary | ICD-10-CM

## 2015-03-19 LAB — COMPREHENSIVE METABOLIC PANEL
ALT: 22 U/L (ref 9–46)
AST: 19 U/L (ref 10–35)
Albumin: 3.8 g/dL (ref 3.6–5.1)
Alkaline Phosphatase: 84 U/L (ref 40–115)
BUN: 16 mg/dL (ref 7–25)
CO2: 24 mmol/L (ref 20–31)
Calcium: 9.9 mg/dL (ref 8.6–10.3)
Chloride: 105 mmol/L (ref 98–110)
Creat: 1.63 mg/dL — ABNORMAL HIGH (ref 0.70–1.25)
Glucose, Bld: 106 mg/dL — ABNORMAL HIGH (ref 65–99)
Potassium: 4.4 mmol/L (ref 3.5–5.3)
Sodium: 137 mmol/L (ref 135–146)
Total Bilirubin: 0.4 mg/dL (ref 0.2–1.2)
Total Protein: 7.1 g/dL (ref 6.1–8.1)

## 2015-03-19 LAB — LIPID PANEL
Cholesterol: 159 mg/dL (ref 125–200)
HDL: 19 mg/dL — ABNORMAL LOW (ref >=40)
LDL Cholesterol: 86 mg/dL (ref <130)
Total CHOL/HDL Ratio: 8.4 Ratio — ABNORMAL HIGH (ref <=5.0)
Triglycerides: 272 mg/dL — ABNORMAL HIGH (ref <150)
VLDL: 54 mg/dL — ABNORMAL HIGH (ref <30)

## 2015-03-19 LAB — POCT INR: INR: 2.9

## 2015-03-19 NOTE — Addendum Note (Signed)
Addended by: Eulis Foster on: 03/19/2015 07:41 AM   Modules accepted: Orders

## 2015-03-19 NOTE — Addendum Note (Signed)
Addended by: Eulis Foster on: 03/19/2015 07:39 AM   Modules accepted: Orders

## 2015-03-31 ENCOUNTER — Other Ambulatory Visit: Payer: Self-pay | Admitting: Cardiology

## 2015-04-02 ENCOUNTER — Ambulatory Visit (INDEPENDENT_AMBULATORY_CARE_PROVIDER_SITE_OTHER): Payer: Managed Care, Other (non HMO)

## 2015-04-02 DIAGNOSIS — Z954 Presence of other heart-valve replacement: Secondary | ICD-10-CM

## 2015-04-02 DIAGNOSIS — Z5181 Encounter for therapeutic drug level monitoring: Secondary | ICD-10-CM | POA: Diagnosis not present

## 2015-04-02 DIAGNOSIS — I5033 Acute on chronic diastolic (congestive) heart failure: Secondary | ICD-10-CM | POA: Diagnosis not present

## 2015-04-02 DIAGNOSIS — I4891 Unspecified atrial fibrillation: Secondary | ICD-10-CM

## 2015-04-02 LAB — POCT INR: INR: 1.9

## 2015-04-16 ENCOUNTER — Ambulatory Visit (INDEPENDENT_AMBULATORY_CARE_PROVIDER_SITE_OTHER): Payer: Managed Care, Other (non HMO) | Admitting: *Deleted

## 2015-04-16 DIAGNOSIS — I4891 Unspecified atrial fibrillation: Secondary | ICD-10-CM

## 2015-04-16 DIAGNOSIS — Z5181 Encounter for therapeutic drug level monitoring: Secondary | ICD-10-CM

## 2015-04-16 DIAGNOSIS — Z954 Presence of other heart-valve replacement: Secondary | ICD-10-CM

## 2015-04-16 DIAGNOSIS — I5033 Acute on chronic diastolic (congestive) heart failure: Secondary | ICD-10-CM | POA: Diagnosis not present

## 2015-04-16 LAB — POCT INR: INR: 2.2

## 2015-04-30 ENCOUNTER — Ambulatory Visit (INDEPENDENT_AMBULATORY_CARE_PROVIDER_SITE_OTHER): Payer: Managed Care, Other (non HMO) | Admitting: *Deleted

## 2015-04-30 DIAGNOSIS — Z5181 Encounter for therapeutic drug level monitoring: Secondary | ICD-10-CM

## 2015-04-30 DIAGNOSIS — I4891 Unspecified atrial fibrillation: Secondary | ICD-10-CM

## 2015-04-30 DIAGNOSIS — Z954 Presence of other heart-valve replacement: Secondary | ICD-10-CM | POA: Diagnosis not present

## 2015-04-30 DIAGNOSIS — I5033 Acute on chronic diastolic (congestive) heart failure: Secondary | ICD-10-CM

## 2015-04-30 LAB — POCT INR: INR: 2.8

## 2015-05-13 ENCOUNTER — Ambulatory Visit (INDEPENDENT_AMBULATORY_CARE_PROVIDER_SITE_OTHER): Payer: Managed Care, Other (non HMO) | Admitting: Thoracic Surgery (Cardiothoracic Vascular Surgery)

## 2015-05-13 ENCOUNTER — Encounter: Payer: Self-pay | Admitting: Thoracic Surgery (Cardiothoracic Vascular Surgery)

## 2015-05-13 VITALS — BP 130/87 | HR 65 | Resp 20 | Ht 68.0 in | Wt 192.0 lb

## 2015-05-13 DIAGNOSIS — Z954 Presence of other heart-valve replacement: Secondary | ICD-10-CM

## 2015-05-13 DIAGNOSIS — Z9889 Other specified postprocedural states: Secondary | ICD-10-CM | POA: Diagnosis not present

## 2015-05-13 DIAGNOSIS — Z8679 Personal history of other diseases of the circulatory system: Secondary | ICD-10-CM | POA: Diagnosis not present

## 2015-05-13 NOTE — Patient Instructions (Signed)

## 2015-05-13 NOTE — Progress Notes (Signed)
[address]       [place] [address]             [phone]     CARDIOTHORACIC SURGERY OFFICE NOTE  Referring Provider is Dorothy Spark, MD PCP is Gilford Rile, MD   HPI:  Patient returns to the office today for routine follow-up and rhythm check approximately one year status post minimally invasive mitral valve replacement using a bileaflet mechanical prosthetic valve and Maze procedure on 05/15/2014 for severe rheumatic mitral valve stenosis with recurrent paroxysmal atrial fibrillation. Postoperatively the patient developed complete heart block requiring permanent pacemaker placement. His most recent echocardiogram was performed 10/15/2014 and notable for the presence of a normal functioning bileaflet mechanical prosthetic valve in the mitral position and preserved LV systolic function.  He was last seen here in our office on 11/12/2014 at which time he was doing well and maintaining sinus rhythm.  Since then he has continued to do well from a cardiac standpoint. He was seen most recently by Dr. [name] on 03/08/2015.  He returns to our office today stating that he feels well. He states that he still gets short of breath or chart with more strenuous physical exertion, but he is only slightly limited by this and he has no symptoms with ordinary activities. He notes that his exercise tolerance and breathing is much better than it was prior to surgery one year ago. He denies any history of resting shortness of breath, PND, orthopnea, or lower extremity edema. He has not had palpitations, dizzy spells, or syncope. He has struggled to some degree keeping his prothrombin time in the therapeutic range but he has not had any significant, occasions with long-term anticoagulation therapy.   Current Outpatient Prescriptions  Medication Sig Dispense Refill  . acetaminophen (TYLENOL) 325 MG tablet Take 325 mg by mouth every 6 (six) hours as needed (pain).     Marland Kitchen ALPRAZolam  (XANAX) 0.5 MG tablet Take 0.5-1 mg by mouth at bedtime as needed for anxiety.     Marland Kitchen amLODipine (NORVASC) 2.5 MG tablet TAKE 1 TABLET EVERY DAY 60 tablet 3  . aspirin EC 81 MG tablet Take 1 tablet (81 mg total) by mouth daily.    Marland Kitchen levothyroxine (SYNTHROID, LEVOTHROID) 75 MCG tablet TAKE 1 TABLET EVERY DAY BEFORE BREAKFAST 90 tablet 1  . metoprolol tartrate (LOPRESSOR) 25 MG tablet TAKE 1 TABLET TWICE DAILY 90 tablet 3  . omega-3 acid ethyl esters (LOVAZA) 1 G capsule Take 2 capsules (2 g total) by mouth 2 (two) times daily. 360 capsule 3  . sertraline (ZOLOFT) 50 MG tablet Take 1 tablet (50 mg total) by mouth daily. 90 tablet 4  . warfarin (COUMADIN) 5 MG tablet TAKE AS DIRECTED BY COUMADIN CLINIC 30 tablet 3   No current facility-administered medications for this visit.      Physical Exam:   BP 130/87 mmHg  Pulse 65  Resp 20  Ht 5\' 8"  (1.727 m)  Wt 192 lb (87.091 kg)  BMI 29.20 kg/m2  SpO2 98%  General:  Well appearing  Chest:   clear  CV:   RRR w/ mechanical heart valve sounds  Incisions:  Completely healed  Abdomen:  soft  Extremities:  warm  Diagnostic Tests:  Transthoracic Echocardiography  Patient:  [place], [place] MR #:    [id number] Study Date: 10/15/2014 Gender:   M Age:    60 Height:   172.7 cm Weight:   83 kg BSA:  2.01 m^2 Pt. Status: Room:  SONOGRAPHER Cindy Hazy, RDCS ATTENDING  Ena Dawley, M.D. ORDERING   Ena Dawley, M.D. REFERRING  Ena Dawley, M.D. PERFORMING  [facility], Outpatient  cc:  ------------------------------------------------------------------- LV EF: 50% -  55%  ------------------------------------------------------------------- Indications:   Z95.4 Mitral Valve Replacement.  ------------------------------------------------------------------- History:  PMH: Acquired from the patient and from the patient&'s chart. PMH: Paroxysmal Atrial Fibrillation. Chronic Kidney Disease.  Rheumatic Heart Disease. Paroxysmal Atrial Fibrillation. Risk factors: Former tobacco use. Hypertension. Dyslipidemia.  ------------------------------------------------------------------- Study Conclusions  - Left ventricle: The cavity size was normal. Wall thickness was increased in a pattern of mild LVH. Systolic function was low normal to mildly reduced. The estimated ejection fraction was in the range of 50% to 55%. Wall motion was normal; there were no regional wall motion abnormalities. - Aortic valve: There was no stenosis. There was trivial regurgitation. - Mitral valve: Mechanical mitral valve with normal function. There was trivial regurgitation. Mean gradient (D): 4 mm Hg. - Left atrium: The atrium was mildly to moderately dilated. - Right ventricle: The cavity size was normal. Pacer wire or catheter noted in right ventricle. Systolic function was normal. - Pulmonary arteries: No complete TR doppler jet so unable to estimate PA systolic pressure. - Inferior vena cava: The vessel was normal in size. The respirophasic diameter changes were in the normal range (>= 50%), consistent with normal central venous pressure.  Impressions:  - Normal LV size with mild LV hypertrophy. EF 50-55% (low normal to mildly reduced systolic function). Mechanical mitral valve appeared to have normal function. Normal RV size and systolic function.  ------------------------------------------------------------------- Labs, prior tests, procedures, and surgery: Mitral Valve Replacement-[employer] Optiform Bileaflet Mechanical Valve. LAA clipping.     Transthoracic echocardiography. M-mode, complete 2D, spectral Doppler, and color Doppler. Birthdate: Patient birthdate: 1956-02-04. Age: Patient is 60 yr old. Sex: Gender: male.  BMI: 27.8 kg/m^2. Blood pressure:   130/78 Patient status: Outpatient. Study date: Study date: 10/15/2014. Study  time: 11:07 AM. Location: [facility]  -------------------------------------------------------------------  ------------------------------------------------------------------- Left ventricle: The cavity size was normal. Wall thickness was increased in a pattern of mild LVH. Systolic function was low normal to mildly reduced. The estimated ejection fraction was in the range of 50% to 55%. Wall motion was normal; there were no regional wall motion abnormalities.  ------------------------------------------------------------------- Aortic valve:  Trileaflet; mildly calcified leaflets. Doppler: There was no stenosis.  There was trivial regurgitation.  ------------------------------------------------------------------- Aorta: Aortic root: The aortic root was normal in size. Ascending aorta: The ascending aorta was normal in size.  ------------------------------------------------------------------- Mitral valve: Mechanical mitral valve with normal function. Doppler: There was trivial regurgitation.  Valve area by pressure half-time: 2.82 cm^2. Indexed valve area by pressure half-time: 1.4 cm^2/m^2. Valve area by continuity equation (using LVOT flow): 1.48 cm^2. Indexed valve area by continuity equation (using LVOT flow): 0.74 cm^2/m^2.  Mean gradient (D): 4 mm Hg. Peak gradient (D): 9 mm Hg.  ------------------------------------------------------------------- Left atrium: The atrium was mildly to moderately dilated.  ------------------------------------------------------------------- Right ventricle: The cavity size was normal. Pacer wire or catheter noted in right ventricle. Systolic function was normal.  ------------------------------------------------------------------- Pulmonic valve:  Structurally normal valve.  Cusp separation was normal. Doppler: Transvalvular velocity was within the normal range. There was no  regurgitation.  ------------------------------------------------------------------- Tricuspid valve:  Doppler: There was no significant regurgitation.  ------------------------------------------------------------------- Pulmonary artery:  No complete TR doppler jet so unable to estimate PA systolic pressure.  ------------------------------------------------------------------- Right atrium: The atrium was normal in size.  -------------------------------------------------------------------  Pericardium: There was no pericardial effusion.  ------------------------------------------------------------------- Systemic veins: Inferior vena cava: The vessel was normal in size. The respirophasic diameter changes were in the normal range (>= 50%), consistent with normal central venous pressure.  ------------------------------------------------------------------- Measurements  Left ventricle              Value     Reference LV ID, ED, PLAX chordal          46  mm    43 - 52 LV ID, ES, PLAX chordal          34  mm    23 - 38 LV fx shortening, PLAX chordal (L)    26  %    >=29 LV PW thickness, ED            11  mm    --------- IVS/LV PW ratio, ED            1.09      <=1.3 Stroke volume, 2D             51  ml    --------- Stroke volume/bsa, 2D           25  ml/m^2  --------- LV e&', lateral              7.33 cm/s   --------- LV E/e&', lateral             20.74     --------- LV e&', medial               6.67 cm/s   --------- LV E/e&', medial              22.79     --------- LV e&', average              7   cm/s   --------- LV E/e&', average             21.71     ---------  Ventricular septum            Value     Reference IVS  thickness, ED             12  mm    ---------  LVOT                   Value     Reference LVOT ID, S                21  mm    --------- LVOT area                 3.46 cm^2   --------- LVOT mean velocity, S           51.2 cm/s   --------- LVOT VTI, S                14.8 cm    ---------  Aortic valve               Value     Reference Aortic regurg pressure          636  ms    --------- half-time  Aorta                   Value     Reference Aortic root ID, ED            29  mm    ---------  Left atrium  Value     Reference LA ID, A-P, ES              44  mm    --------- LA ID/bsa, A-P              2.19 cm/m^2  <=2.2 LA volume, S               62  ml    --------- LA volume/bsa, S             30.8 ml/m^2  --------- LA volume, ES, 1-p A4C          69.2 ml    --------- LA volume/bsa, ES, 1-p A4C        34.4 ml/m^2  --------- LA volume, ES, 1-p A2C          48.3 ml    --------- LA volume/bsa, ES, 1-p A2C        24  ml/m^2  ---------  Mitral valve               Value     Reference Mitral E-wave peak velocity        152  cm/s   --------- Mitral A-wave peak velocity        55.8 cm/s   --------- Mitral mean velocity, D          81.4 cm/s   --------- Mitral deceleration time    (H)    239  ms    150 - 230 Mitral pressure half-time         78  ms    --------- Mitral mean gradient, D          4   mm Hg  --------- Mitral peak gradient, D          9   mm Hg  --------- Mitral E/A ratio, peak          2.72       --------- Mitral valve area, PHT, DP        2.82 cm^2   --------- Mitral valve area/bsa, PHT, DP      1.4  cm^2/m^2 --------- Mitral valve area, LVOT          1.48 cm^2   --------- continuity Mitral valve area/bsa, LVOT        0.74 cm^2/m^2 --------- continuity Mitral annulus VTI, D           34.6 cm    ---------  Right ventricle              Value     Reference RV s&', lateral, S             12.3 cm/s   ---------  Legend: (L) and (H) mark values outside specified reference range.  ------------------------------------------------------------------- Prepared and Electronically Authenticated by  Loralie Champagne, M.D. 2016-07-25T17:44:37      2 channel telemetry rhythm strip demonstrates normal sinus rhythm   Impression:  Patient is doing very well approximately one year status post minimally invasive mitral valve replacement using a bileaflet mechanical prosthetic valve and Maze procedure. He describes stable symptoms of exertional shortness of breath consistent with chronic diastolic congestive heart failure, [place] Heart Association functional class II. He states that his exercise tolerance and breathing is much better than it was prior to surgery. He is maintaining sinus rhythm.  Plan:  The patient has been reminded regarding the importance of dental hygiene and the lifelong need for antibiotic prophylaxis for all dental  cleanings and other related invasive procedures.  We have not recommended any changes to his current medications. In the future the patient will call and return to see [place] only as needed.  I spent in excess of 15 minutes during the conduct of this office consultation and >50% of this time involved direct face-to-face encounter with the patient for counseling and/or coordination of their care.   Valentina Gu. Roxy Manns, MD 05/13/2015 10:54 AM

## 2015-05-22 ENCOUNTER — Ambulatory Visit (INDEPENDENT_AMBULATORY_CARE_PROVIDER_SITE_OTHER): Payer: Managed Care, Other (non HMO) | Admitting: *Deleted

## 2015-05-22 DIAGNOSIS — I5033 Acute on chronic diastolic (congestive) heart failure: Secondary | ICD-10-CM | POA: Diagnosis not present

## 2015-05-22 DIAGNOSIS — I4891 Unspecified atrial fibrillation: Secondary | ICD-10-CM

## 2015-05-22 DIAGNOSIS — Z5181 Encounter for therapeutic drug level monitoring: Secondary | ICD-10-CM | POA: Diagnosis not present

## 2015-05-22 DIAGNOSIS — Z954 Presence of other heart-valve replacement: Secondary | ICD-10-CM

## 2015-05-22 LAB — POCT INR: INR: 2.6

## 2015-05-23 ENCOUNTER — Ambulatory Visit (INDEPENDENT_AMBULATORY_CARE_PROVIDER_SITE_OTHER): Payer: Managed Care, Other (non HMO) | Admitting: *Deleted

## 2015-05-23 DIAGNOSIS — I442 Atrioventricular block, complete: Secondary | ICD-10-CM | POA: Diagnosis not present

## 2015-05-23 NOTE — Progress Notes (Signed)
Remote pacemaker transmission.   

## 2015-05-29 ENCOUNTER — Other Ambulatory Visit: Payer: Self-pay | Admitting: Cardiology

## 2015-05-31 LAB — CUP PACEART REMOTE DEVICE CHECK
Battery Impedance: 100 Ohm
Battery Voltage: 2.8 V
Brady Statistic AP VS Percent: 4 %
Brady Statistic AS VP Percent: 0 %
Date Time Interrogation Session: 20170302142420
Implantable Lead Implant Date: 20160301
Implantable Lead Location: 753859
Implantable Lead Location: 753860
Implantable Lead Model: 5076
Lead Channel Impedance Value: 542 Ohm
Lead Channel Setting Pacing Amplitude: 2 V
Lead Channel Setting Pacing Pulse Width: 0.4 ms
MDC IDC LEAD IMPLANT DT: 20160301
MDC IDC MSMT BATTERY REMAINING LONGEVITY: 164 mo
MDC IDC MSMT LEADCHNL RV IMPEDANCE VALUE: 674 Ohm
MDC IDC SET LEADCHNL RV PACING AMPLITUDE: 2.5 V
MDC IDC SET LEADCHNL RV SENSING SENSITIVITY: 4 mV
MDC IDC STAT BRADY AP VP PERCENT: 0 %
MDC IDC STAT BRADY AS VS PERCENT: 96 %

## 2015-05-31 NOTE — Progress Notes (Signed)
Normal remote reviewed. VHR episode 1:1 AT  Next follow up 08/2015 in clinic.

## 2015-06-05 ENCOUNTER — Encounter: Payer: Self-pay | Admitting: Cardiology

## 2015-06-18 ENCOUNTER — Ambulatory Visit (INDEPENDENT_AMBULATORY_CARE_PROVIDER_SITE_OTHER): Payer: Managed Care, Other (non HMO) | Admitting: *Deleted

## 2015-06-18 DIAGNOSIS — I5033 Acute on chronic diastolic (congestive) heart failure: Secondary | ICD-10-CM

## 2015-06-18 DIAGNOSIS — I4891 Unspecified atrial fibrillation: Secondary | ICD-10-CM

## 2015-06-18 DIAGNOSIS — Z954 Presence of other heart-valve replacement: Secondary | ICD-10-CM

## 2015-06-18 DIAGNOSIS — Z5181 Encounter for therapeutic drug level monitoring: Secondary | ICD-10-CM | POA: Diagnosis not present

## 2015-06-18 LAB — POCT INR: INR: 2.4

## 2015-06-24 NOTE — Progress Notes (Signed)
Cardiology Office Note:    Date:  06/25/2015   ID:  Kevin English, DOB 1955/09/14, MRN [id number]  PCP:  Kevin Rile, MD  Cardiologist:  Dr. Ena Dawley   Electrophysiologist:  Dr. Thompson Grayer  Nephrologist: Dr. Marval Regal  Chief Complaint  Patient presents with  . Follow-up    blood pressure     History of Present Illness:     Kevin English is a 60 y.o. male with a hx of HTN, HL (intol to statins), former smoker, PAFib, Rheumatic heart disease with severe mitral stenosis, diastolic HF, depression, CKD, renal cell CA, OSA. He has been on Coumadin. He had been prepared for minimally invasive MVR and placed on Amiodarone. However, he developed clear cell renal cell CA and underwent R nephrectomy. He developed fatigue with beta-blocker and Bystolic was DC'd.   He was ultimately stabilized and underwent Minimally-Invasive Mitral Valve Replacement (Sorin Carbomedics Optiform bileaflet mechanical valve) and Maze Procedure with Left atrial lesion set using cryothermy and Clipping of Left Atrial Appendage with Dr. Roxy Manns in 2/16. Post op course was complicated by complete heart block. He ultimately required implantation of a Medtronic Adapta L dual-chamber pacemaker.   Last seen by Dr. [name] 12/16.  Recently noted to have elevated blood pressures at follow-up with his nephrologist. He started to monitor his blood pressure and readings ranged between 138/92-177/104. The patient increased his amlodipine to 5 mg daily and metoprolol tartrate to 50 mg twice a day. Today, he feels better and his BP is improved. He denies any chest discomfort. He has chronic dyspnea with exertion without significant change. Sleeps with CPAP. Denies edema. He does have occasional dizziness. Denies syncope. He felt poorly when his blood pressure was elevated.   Past Medical History  Diagnosis Date  . Hernia   . Chronic rhinitis   . Diverticulitis of colon 15 years ago  . Hyperlipidemia   . Hypogonadism  male   . Obesity   . Hypothyroidism   . Persistent atrial fibrillation ([place])     a. sustained 160-180 on e-CARDIO monitor placed 07/29/2013. Placed on IV amiodarone at end of May 2015 due to continued paroxysms with RVR.  Marland Kitchen Rheumatic heart disease   . Mitral stenosis     a. Dx 07/2013 - through workup of 2D echo, TEE, and cath, felt to be moderate.  . Paroxysmal atrial flutter ([place])   . CAD (coronary artery disease)     a. By cath 08/2013: 70% distal cx-prox LPDA; tandem mod LAD lesions, o/w mild dz.  . Renal mass     a. Dx 08/2013: concerning for renal cancer.  Marland Kitchen HTN (hypertension)     hx of- under control  . Stroke [facility]) 1998    "MRI showed 3 mild strokes"  . Borderline diabetes   . Depression   . Anxiety   . Dizzy spells   . Deafness in left ear   . Lung nodule seen on imaging study 04/10/2014    5 mm nodule right lower lobe  . OSA on CPAP     cpap  . CKD (chronic kidney disease), stage II     his creat. runs 2-2.5; hasnt seen neph yet - sees dr. Tresa Moore at [facility]  . Renal cell carcinoma of right kidney ([place]) 09/05/2013    clear cell renal cell carcinoma of right kidney treated by radical nephrectomy, Fuhrman grade 2-3, with maximum tumor diameter 2.7 cm, all tumor to find confined to the kidney, and all surgical margins negative (  [id number]).   . Yeast dermatitis 04/19/2014  . History of shingles   . S/P minimally invasive mitral valve replacement with metallic valve and maze procedure 05/15/2014    19mm Sorin Carbomedics Optiform mechanical valve placed via right mini thoracotomy approach  . S/P Minimally invasive maze operation for atrial fibrillation 05/15/2014    Left side lesion set using cryothermy with clipping of LA appendage  . Pacemaker 05/22/2014    for complete heart block  . Hx of echocardiogram     post MVR >> Echo 4/16:  Mild LVH, EF 45-50%, apical dyskinesis, trivial AI, mechanical MVR functioning appropriately, severe LAE    Past Surgical History  Procedure Laterality  Date  . Tee without cardioversion N/A 08/16/2013    Procedure: TRANSESOPHAGEAL ECHOCARDIOGRAM (TEE);  Surgeon: Dorothy Spark, MD;  Location: [place];  Service: Cardiovascular;  Laterality: N/A;  . Inguinal hernia repair Bilateral first one in 80's    "I had one side done twice"  . Cardiac catheterization  08/28/13  . Eye surgery Right 1990    "reconstructive, lens implant- from paintball accident"  . Robot assisted laparoscopic nephrectomy Right 09/05/2013    Procedure: ROBOTIC ASSISTED LAPAROSCOPIC RIGHT RADICAL NEPHRECTOMY;  Surgeon: Sharyn Creamer, MD;  Location: [facility];  Service: Urology;  Laterality: Right;  . Left and right heart catheterization with coronary angiogram N/A 08/28/2013    Procedure: LEFT AND RIGHT HEART CATHETERIZATION WITH CORONARY ANGIOGRAM;  Surgeon: Leonie Man, MD;  [facility] C[facility] LAB;  Service: Cardiovascular;  Laterality: N/A;  . Mitral valve replacement Right 05/15/2014    Procedure: MINIMALLY INVASIVE MITRAL VALVE (MV) REPLACEMENT;  Surgeon: Rexene Alberts, MD;  Location: [place];  Service: Open Heart Surgery;  Laterality: Right;  . Minimally invasive maze procedure N/A 05/15/2014    Procedure: MINIMALLY INVASIVE MAZE PROCEDURE;  Surgeon: Rexene Alberts, MD;  Location: [place];  Service: Open Heart Surgery;  Laterality: N/A;  . Tee without cardioversion N/A 05/15/2014    Procedure: TRANSESOPHAGEAL ECHOCARDIOGRAM (TEE);  Surgeon: Rexene Alberts, MD;  Location: [place];  Service: Open Heart Surgery;  Laterality: N/A;  . Permanent pacemaker insertion Left 05/22/2014    Procedure: PERMANENT PACEMAKER INSERTION;  Surgeon: Thompson Grayer, MD;  Location: [facility] CATH LAB;  Service: Cardiovascular;  Laterality: Left;    Current Medications: Outpatient Prescriptions Prior to Visit  Medication Sig Dispense Refill  . acetaminophen (TYLENOL) 325 MG tablet Take 325 mg by mouth every 6 (six) hours as needed (pain).     Marland Kitchen ALPRAZolam (XANAX) 0.5 MG tablet Take 0.5-1 mg by mouth  at bedtime as needed for anxiety.     Marland Kitchen aspirin EC 81 MG tablet Take 1 tablet (81 mg total) by mouth daily.    Marland Kitchen levothyroxine (SYNTHROID, LEVOTHROID) 75 MCG tablet TAKE 1 TABLET EVERY DAY BEFORE BREAKFAST 90 tablet 1  . omega-3 acid ethyl esters (LOVAZA) 1 G capsule Take 2 capsules (2 g total) by mouth 2 (two) times daily. 360 capsule 3  . sertraline (ZOLOFT) 50 MG tablet Take 1 tablet (50 mg total) by mouth daily. 90 tablet 4  . warfarin (COUMADIN) 5 MG tablet TAKE AS DIRECTED BY COUMADIN CLINIC 30 tablet 3  . amLODipine (NORVASC) 2.5 MG tablet TAKE 1 TABLET EVERY DAY 60 tablet 3  . metoprolol tartrate (LOPRESSOR) 25 MG tablet Take 1 tablet (25 mg total) by mouth 2 (two) times daily. 180 tablet 2   No facility-administered medications prior to visit.  Allergies:   Contrast media; Atorvastatin; Crestor; Fenofibrate; and Pravastatin   Social History   Social History  . Marital Status: Married    Spouse Name: N/A  . Number of Children: N/A  . Years of Education: N/A   Social History Main Topics  . Smoking status: Former Smoker -- 0.25 packs/day for 40 years    Types: Cigarettes    Quit date: 05/14/2014  . Smokeless tobacco: Never Used  . Alcohol Use: No  . Drug Use: No  . Sexual Activity: Not Currently   Other Topics Concern  . None   Social History Narrative     Family History:  The patient's family history includes Heart attack in his mother; Hypertension in his mother; Stroke in his mother.   ROS:   Please see the history of present illness.    ROS All other systems reviewed and are negative.   Physical Exam:    VS:  BP 122/88 mmHg  Pulse 60  Ht 5\' 8"  (1.727 m)  Wt 189 lb 3.2 oz (85.821 kg)  BMI 28.77 kg/m2   GEN: Well nourished, well developed, in no acute distress HEENT: normal Neck: no JVD, no masses Cardiac: mechanical S1, normal S2, RRR; no murmurs, rubs, or gallops, no edema;  Respiratory:  clear to auscultation bilaterally; no wheezing, rhonchi or  rales GI: soft, nontender, nondistended MS: no deformity or atrophy Skin: warm and dry Neuro: No focal deficits  Psych: Alert and oriented x 3, normal affect  Wt Readings from Last 3 Encounters:  06/25/15 189 lb 3.2 oz (85.821 kg)  05/13/15 192 lb (87.091 kg)  03/08/15 190 lb (86.183 kg)      Studies/Labs Reviewed:     EKG:  EKG is  ordered today.  The ekg ordered today demonstrates A paced, HR 60, LAD, IVCD, NSSTTW changes, QTc 428 ms  Recent Labs: 07/11/2014: Pro B Natriuretic peptide (BNP) 54.0 03/19/2015: ALT 22; BUN 16; Creat 1.63*; Potassium 4.4; Sodium 137  Labs at nephrologists office 06/13/15: BUN 17, creatinine 1.50, potassium 5, ALT 25  Recent Lipid Panel    Component Value Date/Time   CHOL 159 03/19/2015 0756   TRIG 272* 03/19/2015 0756   HDL 19* 03/19/2015 0756   CHOLHDL 8.4* 03/19/2015 0756   VLDL 54* 03/19/2015 0756   LDLCALC 86 03/19/2015 0756   LDLDIRECT 62.0 12/13/2014 0845    Additional studies/ records that were reviewed today include:    Echo 7/16 Mild LVH, EF 50-55%, normal wall motion, trivial AI, mechanical MVR with normal function, mean gradient 4 mmHg, mild to moderate LAE, normal RV function  Echo 4/16 Mild LVH, EF 45-50%, apical dyskinesis, trivial AI, mechanical MVR okay, severe LAE  Carotid US 04/16/14 Bilateral - 1% to 39% ICA stensosis.  TEE [id number] Normal systolic function, normal wall motion, mild AI, moderate LAE Impressions: Rheumatic mitral valve with moderate leaflet tip thickening and minimal calcifications. Moderate mitral stenosis and trace mitral regurgitation.  Cardiac cath 08/28/13 LM: Normal LAD: Proximal 40-50%, distal 50-60% LCx: Small OM 70-80% (not amenable to PCI or CABG), PDA 70% LPDA: 10-20% RCA: Mid 30-40%   ASSESSMENT:     1. Essential hypertension   2. S/P mitral valve replacement with metallic valve   3. Paroxysmal atrial fibrillation   4. Hyperlipidemia   5. Coronary artery disease due to lipid  rich plaque   6. Sleep apnea   7. CKD (chronic kidney disease), stage II     PLAN:     In order  of problems listed above:  1. HTN - Blood pressure has been elevated recently. His blood pressure today is much improved on high-dose amlodipine and metoprolol. I have recommended that he continue on amlodipine 5 mg daily and metoprolol 50 mg twice a day. He will continue to monitor his blood pressures and apprised me of his readings.  2. S/p Mechanical MVR - Echo in 7/16 with normal LV function, well functioning mechanical MVR. Continue Coumadin. Continue SBE prophylaxis.  3. PAF - Maintaining NSR.  Continue Coumadin.  4. HL - Continue current Rx. Statin intol.  5. CAD - No angina.  Continue ASA, beta-blocker.  6. OSA - Continue CPAP.  7. CKD - FU with Neph as planned.     Medication Adjustments/Labs and Tests Ordered: Current medicines are reviewed at length with the patient today.  Concerns regarding medicines are outlined above.  Medication changes, Labs and Tests ordered today are outlined in the Patient Instructions noted below. Patient Instructions  Medication Instructions:  Your physician has recommended you make the following change in your medication:  INCREASE AMLODIPINE TO 5 MG DAILY; NEW RX SENT IN TODAY FOR THE NEW DOSE INCREASE METOPROLOL TO 50 MG TWICE DAILY; NEW RX SENT IN TODAY FOR THE NEW DOSE  Labwork: NONE  Testing/Procedures: NONE  Follow-Up: KEEP YOUR APPT WITH DR. [name] 09/10/15  Any Other Special Instructions Will Be Listed Below (If Applicable). MONITOR BP DAILY AND CALL WITH THE READINGS IN 2 WEEKS [phone]  If you need a refill on your cardiac medications before your next appointment, please call your pharmacy.   Signed, Richardson Dopp, PA-C  06/25/2015 4:56 PM    [facility] [address], [place], [place]  [address] Phone: [phone]; Fax: [phone]

## 2015-06-25 ENCOUNTER — Ambulatory Visit (INDEPENDENT_AMBULATORY_CARE_PROVIDER_SITE_OTHER): Payer: Managed Care, Other (non HMO) | Admitting: Physician Assistant

## 2015-06-25 ENCOUNTER — Encounter: Payer: Self-pay | Admitting: Physician Assistant

## 2015-06-25 VITALS — BP 122/88 | HR 60 | Ht 68.0 in | Wt 189.2 lb

## 2015-06-25 DIAGNOSIS — E785 Hyperlipidemia, unspecified: Secondary | ICD-10-CM | POA: Diagnosis not present

## 2015-06-25 DIAGNOSIS — I10 Essential (primary) hypertension: Secondary | ICD-10-CM | POA: Diagnosis not present

## 2015-06-25 DIAGNOSIS — I2583 Coronary atherosclerosis due to lipid rich plaque: Secondary | ICD-10-CM

## 2015-06-25 DIAGNOSIS — I2510 Atherosclerotic heart disease of native coronary artery without angina pectoris: Secondary | ICD-10-CM

## 2015-06-25 DIAGNOSIS — Z954 Presence of other heart-valve replacement: Secondary | ICD-10-CM | POA: Diagnosis not present

## 2015-06-25 DIAGNOSIS — N182 Chronic kidney disease, stage 2 (mild): Secondary | ICD-10-CM

## 2015-06-25 DIAGNOSIS — I480 Paroxysmal atrial fibrillation: Secondary | ICD-10-CM | POA: Diagnosis not present

## 2015-06-25 DIAGNOSIS — G4730 Sleep apnea, unspecified: Secondary | ICD-10-CM

## 2015-06-25 MED ORDER — AMLODIPINE BESYLATE 5 MG PO TABS: 5.0000 mg | ORAL_TABLET | Freq: Every day | ORAL | Status: DC

## 2015-06-25 MED ORDER — METOPROLOL TARTRATE 50 MG PO TABS: 50.0000 mg | ORAL_TABLET | Freq: Two times a day (BID) | ORAL | Status: DC

## 2015-06-25 NOTE — Patient Instructions (Addendum)
Medication Instructions:  Your physician has recommended you make the following change in your medication:  INCREASE AMLODIPINE TO 5 MG DAILY; NEW RX SENT IN TODAY FOR THE NEW DOSE INCREASE METOPROLOL TO 50 MG TWICE DAILY; NEW RX SENT IN TODAY FOR THE NEW DOSE  Labwork: NONE  Testing/Procedures: NONE  Follow-Up: KEEP YOUR APPT WITH DR. [name] 09/10/15  Any Other Special Instructions Will Be Listed Below (If Applicable). MONITOR BP DAILY AND CALL WITH THE READINGS IN 2 WEEKS [phone]  If you need a refill on your cardiac medications before your next appointment, please call your pharmacy.

## 2015-07-16 ENCOUNTER — Ambulatory Visit (INDEPENDENT_AMBULATORY_CARE_PROVIDER_SITE_OTHER): Payer: Managed Care, Other (non HMO)

## 2015-07-16 ENCOUNTER — Other Ambulatory Visit: Payer: Self-pay | Admitting: Cardiology

## 2015-07-16 DIAGNOSIS — I5033 Acute on chronic diastolic (congestive) heart failure: Secondary | ICD-10-CM | POA: Diagnosis not present

## 2015-07-16 DIAGNOSIS — Z954 Presence of other heart-valve replacement: Secondary | ICD-10-CM

## 2015-07-16 DIAGNOSIS — I4891 Unspecified atrial fibrillation: Secondary | ICD-10-CM | POA: Diagnosis not present

## 2015-07-16 DIAGNOSIS — Z5181 Encounter for therapeutic drug level monitoring: Secondary | ICD-10-CM | POA: Diagnosis not present

## 2015-07-16 LAB — POCT INR: INR: 2.4

## 2015-07-16 MED ORDER — WARFARIN SODIUM 5 MG PO TABS: ORAL_TABLET | ORAL | Status: DC

## 2015-07-16 NOTE — Telephone Encounter (Signed)
Spoke with pharmacy & gave them a verbal order for the pt's Coumadin refill because their fax machine sent a blank copy.

## 2015-07-16 NOTE — Telephone Encounter (Signed)
New Message:     Please refax prescription for Warfarin to [phone].

## 2015-07-19 ENCOUNTER — Telehealth: Payer: Self-pay | Admitting: Cardiology

## 2015-07-19 NOTE — Telephone Encounter (Signed)
°*  STAT* If patient is at the pharmacy, call can be transferred to refill team.   1. Which medications need to be refilled? (please list name of each medication and dose if known) Amlodipine and Metoprolol  2. Which pharmacy/location (including street and city if local pharmacy) is medication to be sent to[facility] [place]  3. Do they need a 30 day or 90 day supply? 30  ]

## 2015-07-19 NOTE — Telephone Encounter (Signed)
Spoke with patient as he actually has refills for the year at the pharmacy. He is aware of this, but stated that the issue is that he has had to double the dose on both the amlodipine and metoprolol due to his bp being elevated so it is too soon for his insurance to cover a refill. The highest reading that he got was 195/93. Please advise. Thanks, MI

## 2015-07-19 NOTE — Telephone Encounter (Signed)
Pt initially called into the office to request for a refill on  his amlodipine and metoprolol that [employer] PA-C increased on this pt on 06/25/15, for HTN.   Per refill dept, it is too soon for the pt to refill both of these medications, for he should still have a sufficient supply to hold him over, if he's taking this as instructed.   Contacted the pt to obtain more information and both the pt and wife confirm that the pt is taking more of both amlodipine and metoprolol, than prescribed.  Asked the pt why he is taking more than prescribed and to report how much more is he taking, and both the pt and wife states that "ever since the he was instructed to monitor his BP for 2 weeks and call or bring his readings into his coumadin clinic appt on 4/25 (as instructed by Nicki Reaper on 06/25/15 OV), he has noticed that his BP is not responding to amlodipine and metoprolol as Scott prescribed, so he decided to increase both of these to experiment with his pressures." Pt states that "I have doubled my amlodipine to taking 10 mg po daily, and he is taking metoprolol tartrate 50 mg po TID.   Pt states that "my BP is much more responsive to his decision too increase both of these meds, despite this is not medically advised."  Asked the pt how his BP readings were for the 2 weeks he wrote this down, and did Scott ever receive this, and per the pt and wife, this was hand delivered too Coumadin Clinic RN, and was to be given too Richardson Dopp for further review and recommendation. Informed the pt and wife that it doesn't appear that Nicki Reaper ever received his logged BP readings, for I see no note in his chart, and its not in his mailbox or cart. Informed the pt that however, I cannot confirm this for Scott and his covering CMA are both out of the office this afternoon.   Asked the pt to provide me with his most current BP reading and how much of amlodipine and metoprolol he took at that time, if he can recall this:  Per the  pt, he states that he took his BP at 0830 this morning and pre-med it was 146/82.  Pt then states that he took one metoprolol 50 mg tablet  and amlodipine 5  mg thereafter and rechecked his BP later in the afternoon and it is now 115/73.  Pt is scheduled for another dose of metoprolol 50 mg this evening, and no more amlodipine, which I advised him NOT too take an extra amlodipine.  This is pts normal prescribed regimen.  Pt states that when he doubles up on both of these meds, that it varies, based on what his BP readings are.  Pt states "It was on the list given to the Coumadin Clinic RN.   Pt states that now his issue is, he will run out of both of these meds on this coming up Tuesday, so that's why he called in for a refill.  Pt also wanted to add that he is "not symptomatic when his BP is elevated, just more worried."  Pt states that he does "recall that the highest reading he wrote down was 195/93, but cannot recall what date or time of day this was taken."  Pt states he "doubled his meds that day though." Informed the pt that self-dosing his metoprolol and amlodipine, is COMPLETELY contraindicated without contacting his Medical  provider first.  Advised the pt that if he is symptomatic, or is having elevated readings with his prescribed medications on board, then he should call our office to inform us of this, so we can further advise.   Advised the pt that being his BP readings are stable and he has enough amlodipine and metoprolol on-hand until Tuesday, he should take both of these medications EXACTLY AS PRESCRIBED, and call our office with any acute readings, or change in condition. Informed the pt that I will route this message to both Nicki Reaper and Dr [name] for further review and recommendation, for both will be back in the office on Monday.  Informed the pt that on Monday Scott and his CMA can follow-up on his BP readings written down and given too Coumadin Clinic RN on 4/25.  Scott's CMA can  follow-up on the location of this log.   Did call coumadin clinic to ask if the BP readings were there, and this is not located at this time, but they will follow-up with the Coumadin RN who received this list, for she's off today.   Pt verbalized understanding and agrees with this plan.

## 2015-07-22 MED ORDER — AMLODIPINE BESYLATE 10 MG PO TABS: 10.0000 mg | ORAL_TABLET | Freq: Every day | ORAL | Status: DC

## 2015-07-22 NOTE — Telephone Encounter (Signed)
BP readings were in my folder for review. I looked those today. BP borderline high. It appears the patient has been taking Metoprolol 50 mg bid and Amlodipine 10 mg QD on occasion.  Information provided to me indicates his BP was better on this regimen. Please have patient increase Amlodipine to 10 mg QD. I would like him to remain on Metoprolol 50 mg bid as prescribed for now. Continue to monitor and notify me if BP consistently or frequently > 140/90. Richardson Dopp, PA-C   07/22/2015 3:59 PM

## 2015-07-22 NOTE — Telephone Encounter (Signed)
Spoke with pt's wife, DPR on file, and informed her of recommendations per Richardson Dopp, PA-C. Wife verbalized understanding and was in agreement with this plan. Verified pharmacy and sent over prescription. Wife states that they will drop off new BP readings when pt comes in for [facility] appt on 5/16.

## 2015-07-23 ENCOUNTER — Ambulatory Visit: Payer: Managed Care, Other (non HMO) | Admitting: Physician Assistant

## 2015-07-23 NOTE — Telephone Encounter (Signed)
Good morning Kevin English,  Thank you to you and Anderson Malta B. for taking care of this for pt. Scott did get BP readings and are now [place], [place].   Arbie Cookey

## 2015-08-02 ENCOUNTER — Other Ambulatory Visit: Payer: Self-pay | Admitting: Cardiology

## 2015-08-05 DIAGNOSIS — N183 Chronic kidney disease, stage 3 unspecified: Secondary | ICD-10-CM | POA: Insufficient documentation

## 2015-08-05 DIAGNOSIS — Z952 Presence of prosthetic heart valve: Secondary | ICD-10-CM | POA: Insufficient documentation

## 2015-08-05 DIAGNOSIS — R5383 Other fatigue: Secondary | ICD-10-CM

## 2015-08-05 DIAGNOSIS — R5381 Other malaise: Secondary | ICD-10-CM | POA: Insufficient documentation

## 2015-08-06 ENCOUNTER — Ambulatory Visit (INDEPENDENT_AMBULATORY_CARE_PROVIDER_SITE_OTHER): Payer: Managed Care, Other (non HMO) | Admitting: *Deleted

## 2015-08-06 DIAGNOSIS — Z5181 Encounter for therapeutic drug level monitoring: Secondary | ICD-10-CM

## 2015-08-06 DIAGNOSIS — I5033 Acute on chronic diastolic (congestive) heart failure: Secondary | ICD-10-CM

## 2015-08-06 DIAGNOSIS — Z954 Presence of other heart-valve replacement: Secondary | ICD-10-CM

## 2015-08-06 DIAGNOSIS — I4891 Unspecified atrial fibrillation: Secondary | ICD-10-CM | POA: Diagnosis not present

## 2015-08-06 DIAGNOSIS — Z85528 Personal history of other malignant neoplasm of kidney: Secondary | ICD-10-CM | POA: Insufficient documentation

## 2015-08-06 LAB — POCT INR: INR: 3

## 2015-08-07 ENCOUNTER — Telehealth: Payer: Self-pay | Admitting: Physician Assistant

## 2015-08-07 DIAGNOSIS — I10 Essential (primary) hypertension: Secondary | ICD-10-CM

## 2015-08-07 MED ORDER — METOPROLOL TARTRATE 50 MG PO TABS: 75.0000 mg | ORAL_TABLET | Freq: Two times a day (BID) | ORAL | Status: DC

## 2015-08-07 NOTE — Telephone Encounter (Signed)
Reviewed recent BP readings provided by patient yesterday 08/06/15. Some readings remain borderline elevated and some are optimal. Recommend he increase Metoprolol Tartrate from 50 >> 75 mg Twice daily  [facility] [address] [facility] [place]. This should keep him at goal. FU as planned. Richardson Dopp, PA-C   08/07/2015 4:34 PM

## 2015-08-08 NOTE — Telephone Encounter (Signed)
S/w pt in regards to his BP readings reviewed by Brynda Rim. PA. Pt aware of recommendation to increase Metoprolol Tart to 75 mg BID, Rx sent in. I did advise pt to monitor BP If still elevated bring readings to [facility] appt to give to [place] for review.

## 2015-08-27 ENCOUNTER — Encounter: Payer: Self-pay | Admitting: Nurse Practitioner

## 2015-08-27 NOTE — Progress Notes (Signed)
Electrophysiology Office Note Date: 08/28/2015  ID:  [name], DOB 05-Mar-1956, MRN [id number]  PCP: Gilford Rile, MD Primary Cardiologist: [name] Electrophysiologist: Allred  CC: Pacemaker follow-up  Kevin English is a 60 y.o. male seen today for Dr Rayann Heman.  He presents today for routine electrophysiology followup.  Since last being seen in our clinic, the patient reports doing very well.  He denies chest pain, palpitations, dyspnea, PND, orthopnea, nausea, vomiting, dizziness, syncope, edema, weight gain, or early satiety.  Device History: MDT dual chamber PPM implanted 2016 for complete heart block    Past Medical History  Diagnosis Date  . Hernia   . Diverticulitis of colon 15 years ago  . Hyperlipidemia   . Hypogonadism male   . Obesity   . Hypothyroidism   . Persistent atrial fibrillation ([place])     a. sustained 160-180 on e-CARDIO monitor placed 07/29/2013. Placed on IV amiodarone at end of May 2015 due to continued paroxysms with RVR.  Marland Kitchen Rheumatic heart disease   . Mitral stenosis     a. Dx 07/2013 - through workup of 2D echo, TEE, and cath, felt to be moderate.  Marland Kitchen CAD (coronary artery disease)     a. By cath 08/2013: 70% distal cx-prox LPDA; tandem mod LAD lesions, o/w mild dz.  . Renal mass     a. Dx 08/2013: concerning for renal cancer.  Marland Kitchen HTN (hypertension)   . Stroke [facility]) 1998    "MRI showed 3 mild strokes"  . Borderline diabetes   . Depression   . Anxiety   . Deafness in left ear   . Lung nodule seen on imaging study 04/10/2014    5 mm nodule right lower lobe  . OSA on CPAP   . CKD (chronic kidney disease), stage II     his creat. runs 2-2.5; hasnt seen neph yet - sees dr. Tresa Moore at [facility]  . Renal cell carcinoma of right kidney ([place]) 09/05/2013    clear cell renal cell carcinoma of right kidney treated by radical nephrectomy, Fuhrman grade 2-3, with maximum tumor diameter 2.7 cm, all tumor to find confined to the kidney, and all surgical margins negative  (T1aN0).   . S/P minimally invasive mitral valve replacement with metallic valve and maze procedure 05/15/2014    53mm Sorin Carbomedics Optiform mechanical valve placed via right mini thoracotomy approach  . S/P Minimally invasive maze operation for atrial fibrillation 05/15/2014    Left side lesion set using cryothermy with clipping of LA appendage  . Hx of echocardiogram     post MVR >> Echo 4/16:  Mild LVH, EF 45-50%, apical dyskinesis, trivial AI, mechanical MVR functioning appropriately, severe LAE  . Complete heart block (HCC)     a. s/p PPM    Past Surgical History  Procedure Laterality Date  . Tee without cardioversion N/A 08/16/2013    Procedure: TRANSESOPHAGEAL ECHOCARDIOGRAM (TEE);  Surgeon: Dorothy Spark, MD;  Location: [place];  Service: Cardiovascular;  Laterality: N/A;  . Inguinal hernia repair Bilateral first one in 80's    "I had one side done twice"  . Cardiac catheterization  08/28/13  . Eye surgery Right 1990    "reconstructive, lens implant- from paintball accident"  . Robot assisted laparoscopic nephrectomy Right 09/05/2013    Procedure: ROBOTIC ASSISTED LAPAROSCOPIC RIGHT RADICAL NEPHRECTOMY;  Surgeon: Sharyn Creamer, MD;  [facility];  Service: Urology;  Laterality: Right;  . Left and right heart catheterization with coronary angiogram N/A 08/28/2013  Procedure: LEFT AND RIGHT HEART CATHETERIZATION WITH CORONARY ANGIOGRAM;  Surgeon: Leonie Man, MD;  Location: [facility] CATH LAB;  Service: Cardiovascular;  Laterality: N/A;  . Mitral valve replacement Right 05/15/2014    Procedure: MINIMALLY INVASIVE MITRAL VALVE (MV) REPLACEMENT;  Surgeon: Rexene Alberts, MD;  Location: [place];  Service: Open Heart Surgery;  Laterality: Right;  . Minimally invasive maze procedure N/A 05/15/2014    Procedure: MINIMALLY INVASIVE MAZE PROCEDURE;  Surgeon: Rexene Alberts, MD;  Location: [place];  Service: Open Heart Surgery;  Laterality: N/A;  . Tee without cardioversion N/A  05/15/2014    Procedure: TRANSESOPHAGEAL ECHOCARDIOGRAM (TEE);  Surgeon: Rexene Alberts, MD;  Location: [place];  Service: Open Heart Surgery;  Laterality: N/A;  . Permanent pacemaker insertion Left 05/22/2014    Procedure: PERMANENT PACEMAKER INSERTION;  Surgeon: Thompson Grayer, MD;  Location: [facility] CATH LAB;  Service: Cardiovascular;  Laterality: Left;    Current Outpatient Prescriptions  Medication Sig Dispense Refill  . acetaminophen (TYLENOL) 325 MG tablet Take 325 mg by mouth every 6 (six) hours as needed (pain).     Marland Kitchen ALPRAZolam (XANAX) 0.5 MG tablet Take 0.5-1 mg by mouth at bedtime as needed for anxiety.     Marland Kitchen amLODipine (NORVASC) 10 MG tablet Take 1 tablet (10 mg total) by mouth daily. 90 tablet 3  . aspirin EC 81 MG tablet Take 1 tablet (81 mg total) by mouth daily.    Marland Kitchen levothyroxine (SYNTHROID, LEVOTHROID) 75 MCG tablet TAKE 1 TABLET EVERY DAY BEFORE BREAKFAST 90 tablet 1  . metoprolol (LOPRESSOR) 50 MG tablet Take 1.5 tablets (75 mg total) by mouth 2 (two) times daily. 270 tablet 3  . omega-3 acid ethyl esters (LOVAZA) 1 G capsule Take 2 capsules (2 g total) by mouth 2 (two) times daily. 360 capsule 3  . sertraline (ZOLOFT) 50 MG tablet Take 1 tablet (50 mg total) by mouth daily. 90 tablet 4  . warfarin (COUMADIN) 5 MG tablet TAKE AS DIRECTED BY COUMADIN CLINIC 40 tablet 3   No current facility-administered medications for this visit.    Allergies:   Contrast media; Ioxaglate; Atorvastatin; Crestor; Fenofibrate; and Pravastatin   Social History: Social History   Social History  . Marital Status: Married    Spouse Name: N/A  . Number of Children: N/A  . Years of Education: N/A   Occupational History  . Not on file.   Social History Main Topics  . Smoking status: Former Smoker -- 0.25 packs/day for 40 years    Types: Cigarettes    Quit date: 05/14/2014  . Smokeless tobacco: Never Used  . Alcohol Use: No  . Drug Use: No  . Sexual Activity: Not Currently   Other Topics  Concern  . Not on file   Social History Narrative    Family History: Family History  Problem Relation Age of Onset  . Hypertension    . Hypertension Mother   . Heart attack Mother   . Stroke Mother      Review of Systems: All other systems reviewed and are otherwise negative except as noted above.   Physical Exam: VS:  BP 116/64 mmHg  Pulse 60  Ht 5\' 8"  (1.727 m)  Wt 188 lb 6.4 oz (85.458 kg)  BMI 28.65 kg/m2  SpO2 97% , BMI Body mass index is 28.65 kg/(m^2).  GEN- The patient is well appearing, alert and oriented x 3 today.   HEENT: normocephalic, atraumatic; sclera clear, conjunctiva pink; hearing intact; oropharynx  clear; neck supple  Lungs- Clear to ausculation bilaterally, normal work of breathing.  No wheezes, rales, rhonchi Heart- Regular rate and rhythm, no murmurs, rubs or gallops  GI- soft, non-tender, non-distended, bowel sounds present  Extremities- no clubbing, cyanosis, or edema; DP/PT/radial pulses 2+ bilaterally MS- no significant deformity or atrophy Skin- warm and dry, no rash or lesion; PPM pocket well healed Psych- euthymic mood, full affect Neuro- strength and sensation are intact  PPM Interrogation- reviewed in detail today,  See PACEART report  EKG:  EKG is not ordered today.  Recent Labs: 03/19/2015: ALT 22; BUN 16; Creat 1.63*; Potassium 4.4; Sodium 137   Wt Readings from Last 3 Encounters:  08/28/15 188 lb 6.4 oz (85.458 kg)  06/25/15 189 lb 3.2 oz (85.821 kg)  05/13/15 192 lb (87.091 kg)     Other studies Reviewed: Additional studies/ records that were reviewed today include: Dr Rayann Heman and Dr Francesca Oman office notes  Assessment and Plan:  1.  Transient complete heart block  Normal PPM function See Pace Art report No changes today MyCarelink Smart monitor given to patient today   2.  Paroxysmal atrial fibrillation Burden 0% by device interrogation today Continue Warfarin for CHADS2VASC of 5 No bleeding issues  3.  MV disease  s/p MVR Continue Warfarin Followed by Dr [name]  4.  HTN Stable No change required today  5.  CAD No recent ischemic symptoms Continue medical therapy    Current medicines are reviewed at length with the patient today.   The patient does not have concerns regarding his medicines.  The following changes were made today:  none  Labs/ tests ordered today include: none    Disposition:   Follow up with Carelink transmissions, me in 1 year   Signed, Chanetta Marshall, NP 08/28/2015 12:50 PM  [address] [place] [address] [phone] (office) [phone] (fax)

## 2015-08-28 ENCOUNTER — Ambulatory Visit (INDEPENDENT_AMBULATORY_CARE_PROVIDER_SITE_OTHER): Payer: Managed Care, Other (non HMO) | Admitting: Nurse Practitioner

## 2015-08-28 ENCOUNTER — Encounter: Payer: Self-pay | Admitting: Nurse Practitioner

## 2015-08-28 ENCOUNTER — Encounter: Payer: Self-pay | Admitting: Internal Medicine

## 2015-08-28 ENCOUNTER — Ambulatory Visit (INDEPENDENT_AMBULATORY_CARE_PROVIDER_SITE_OTHER): Payer: Managed Care, Other (non HMO) | Admitting: *Deleted

## 2015-08-28 VITALS — BP 116/64 | HR 60 | Ht 68.0 in | Wt 188.4 lb

## 2015-08-28 DIAGNOSIS — Z954 Presence of other heart-valve replacement: Secondary | ICD-10-CM | POA: Diagnosis not present

## 2015-08-28 DIAGNOSIS — I4891 Unspecified atrial fibrillation: Secondary | ICD-10-CM | POA: Diagnosis not present

## 2015-08-28 DIAGNOSIS — I480 Paroxysmal atrial fibrillation: Secondary | ICD-10-CM | POA: Diagnosis not present

## 2015-08-28 DIAGNOSIS — Z5181 Encounter for therapeutic drug level monitoring: Secondary | ICD-10-CM

## 2015-08-28 DIAGNOSIS — I10 Essential (primary) hypertension: Secondary | ICD-10-CM

## 2015-08-28 DIAGNOSIS — I442 Atrioventricular block, complete: Secondary | ICD-10-CM | POA: Diagnosis not present

## 2015-08-28 DIAGNOSIS — I5033 Acute on chronic diastolic (congestive) heart failure: Secondary | ICD-10-CM

## 2015-08-28 LAB — POCT INR: INR: 2.9

## 2015-08-28 NOTE — Patient Instructions (Signed)
Medication Instructions:   Your physician recommends that you continue on your current medications as directed. Please refer to the Current Medication list given to you today.   If you need a refill on your cardiac medications before your next appointment, please call your pharmacy.  Labwork: NONE ORDER TODAY    Testing/Procedures:  NONE ORDER TODAY    Follow-Up:  Your physician wants you to follow-up in: [place] will receive a reminder letter in the mail two months in advance. If you don't receive a letter, please call our office to schedule the follow-up appointment.  Remote monitoring is used to monitor your Pacemaker of ICD from home. This monitoring reduces the number of office visits required to check your device to one time per year. It allows [place] to keep an eye on the functioning of your device to ensure it is working properly. You are scheduled for a device check from home on ..11/27/2015.Marland KitchenMarland KitchenYou may send your transmission at any time that day. If you have a wireless device, the transmission will be sent automatically. After your physician reviews your transmission, you will receive a postcard with your next transmission date.     Any Other Special Instructions Will Be Listed Below (If Applicable).

## 2015-09-02 ENCOUNTER — Telehealth: Payer: Self-pay | Admitting: Cardiology

## 2015-09-02 NOTE — Telephone Encounter (Signed)
Returned call to pt, pt was started on Augmentin on Friday 08/30/15.  Pt was also rx Prednisone 10mg  taper 5 x 2, 4 x 2, 3 x 2, 2 x 2, then 1 x 2 then stop. Starting Prednisone today, waited to check with nephrologist before taking.  Advised pt's wife will need to check INR on Friday 09/06/15 secondary to starting Prednisone and it's interaction with Coumadin. Made f/u appt for 09/06/15 at 12:00, wife verbalized understanding.

## 2015-09-02 NOTE — Telephone Encounter (Signed)
Will route this to the appropriate party, Coumadin clinic, for the pt is established with them.

## 2015-09-02 NOTE — Telephone Encounter (Signed)
New message      Pt c/o medication issue:  1. Name of Medication: Prednisone 10 mg po as directed/Amox-clde (Augmentin)  2. How are you currently taking this medication (dosage and times per day)? 1 st is a directed/875/125 mg taking twice daily  3. Are you having a reaction (difficulty breathing--STAT)? no  4. What is your medication issue? The wife is concerned the pt is on Coumadin the wife states is it ok for the pt to be on the Prednisone/Antibotic

## 2015-09-04 ENCOUNTER — Telehealth: Payer: Self-pay | Admitting: Cardiology

## 2015-09-04 MED ORDER — WARFARIN SODIUM 5 MG PO TABS: 5.0000 mg | ORAL_TABLET | Freq: Every day | ORAL | Status: DC

## 2015-09-04 NOTE — Telephone Encounter (Signed)
New message     Pt c/o medication issue:  1. Name of Medication: coumadin prescription  2. How are you currently taking this medication (dosage and times per day)? Only documented as directed  3. Are you having a reaction (difficulty breathing--STAT)? no  4. What is your medication issue? The pharmacist needs to know how much the pt needs to take.

## 2015-09-04 NOTE — Telephone Encounter (Signed)
Will route this message to the appropriate party, coumadin clinic, to follow-up with the pt and pharmacy, in regards to coumadin questions.

## 2015-09-04 NOTE — Telephone Encounter (Signed)
New Rx with dosing clarification sent to [facility] pharmacy

## 2015-09-06 ENCOUNTER — Ambulatory Visit (INDEPENDENT_AMBULATORY_CARE_PROVIDER_SITE_OTHER): Payer: Managed Care, Other (non HMO) | Admitting: *Deleted

## 2015-09-06 DIAGNOSIS — Z954 Presence of other heart-valve replacement: Secondary | ICD-10-CM

## 2015-09-06 DIAGNOSIS — Z5181 Encounter for therapeutic drug level monitoring: Secondary | ICD-10-CM

## 2015-09-06 DIAGNOSIS — I4891 Unspecified atrial fibrillation: Secondary | ICD-10-CM

## 2015-09-06 DIAGNOSIS — I5033 Acute on chronic diastolic (congestive) heart failure: Secondary | ICD-10-CM

## 2015-09-06 LAB — POCT INR: INR: 2.6

## 2015-09-10 ENCOUNTER — Ambulatory Visit (INDEPENDENT_AMBULATORY_CARE_PROVIDER_SITE_OTHER): Payer: Managed Care, Other (non HMO)

## 2015-09-10 ENCOUNTER — Ambulatory Visit (INDEPENDENT_AMBULATORY_CARE_PROVIDER_SITE_OTHER): Payer: Managed Care, Other (non HMO) | Admitting: Cardiology

## 2015-09-10 ENCOUNTER — Encounter: Payer: Self-pay | Admitting: Cardiology

## 2015-09-10 VITALS — BP 120/60 | HR 59 | Ht 68.0 in | Wt 188.0 lb

## 2015-09-10 DIAGNOSIS — I2583 Coronary atherosclerosis due to lipid rich plaque: Secondary | ICD-10-CM

## 2015-09-10 DIAGNOSIS — I442 Atrioventricular block, complete: Secondary | ICD-10-CM

## 2015-09-10 DIAGNOSIS — I5033 Acute on chronic diastolic (congestive) heart failure: Secondary | ICD-10-CM

## 2015-09-10 DIAGNOSIS — I4891 Unspecified atrial fibrillation: Secondary | ICD-10-CM | POA: Diagnosis not present

## 2015-09-10 DIAGNOSIS — I10 Essential (primary) hypertension: Secondary | ICD-10-CM | POA: Diagnosis not present

## 2015-09-10 DIAGNOSIS — G4730 Sleep apnea, unspecified: Secondary | ICD-10-CM | POA: Diagnosis not present

## 2015-09-10 DIAGNOSIS — I2510 Atherosclerotic heart disease of native coronary artery without angina pectoris: Secondary | ICD-10-CM | POA: Diagnosis not present

## 2015-09-10 DIAGNOSIS — Z954 Presence of other heart-valve replacement: Secondary | ICD-10-CM

## 2015-09-10 DIAGNOSIS — I480 Paroxysmal atrial fibrillation: Secondary | ICD-10-CM

## 2015-09-10 DIAGNOSIS — E785 Hyperlipidemia, unspecified: Secondary | ICD-10-CM

## 2015-09-10 DIAGNOSIS — Z5181 Encounter for therapeutic drug level monitoring: Secondary | ICD-10-CM | POA: Diagnosis not present

## 2015-09-10 LAB — POCT INR: INR: 2.6

## 2015-09-10 MED ORDER — FLUTICASONE PROPIONATE 50 MCG/ACT NA SUSP: 2.0000 | Freq: Every day | NASAL | Status: DC

## 2015-09-10 MED ORDER — LORATADINE 10 MG PO TABS: 10.0000 mg | ORAL_TABLET | Freq: Every day | ORAL | Status: DC

## 2015-09-10 NOTE — Patient Instructions (Signed)
Medication Instructions:   START USING FLONASE NASAL SPRAY--2 SPRAYS IN BOTH NOSTRILS DAILY-YOU CAN PURCHASE THIS MEDICATION AT A CHEAPER PRICE OTC AT YOUR LOCAL PHARMACY    START USING CLARITIN 10 MG ONCE DAILY---YOU CAN PURCHASE THIS MEDICATION AT A CHEAPER PRICE OTC AT YOUR LOCAL PHARMACY    Follow-Up:  Your physician wants you to follow-up in: [place] will receive a reminder letter in the mail two months in advance. If you don't receive a letter, please call our office to schedule the follow-up appointment.        If you need a refill on your cardiac medications before your next appointment, please call your pharmacy.

## 2015-09-10 NOTE — Progress Notes (Signed)
Cardiology Office Note:    Date:  09/10/2015   ID:  [name], DOB 1955-06-21, MRN [id number]  PCP:  Gilford Rile, MD  Cardiologist:  Dr. Ena Dawley   Electrophysiologist:  Dr. Thompson Grayer  Nephrologist: Dr. Marval Regal  No chief complaint on file.   History of Present Illness:     Kevin English is a 60 y.o. male with a hx of HTN, HL (intol to statins), former smoker, PAFib, Rheumatic heart disease with severe mitral stenosis, diastolic HF, depression, CKD, renal cell CA, OSA. He has been on Coumadin. He had been prepared for minimally invasive MVR and placed on Amiodarone. However, he developed clear cell renal cell CA and underwent R nephrectomy. He developed fatigue with beta-blocker and Bystolic was DC'd.   He was ultimately stabilized and underwent Minimally-Invasive Mitral Valve Replacement (Sorin Carbomedics Optiform bileaflet mechanical valve) and Maze Procedure with Left atrial lesion set using cryothermy and Clipping of Left Atrial Appendage with Dr. Roxy Manns in 2/16. Post op course was complicated by complete heart block. He ultimately required implantation of a Medtronic Adapta L dual-chamber pacemaker.   09/10/2015  - coming after 6 months in between he saw Richardson Dopp and he declined for pacemaker interrogation. He's pacemaker is functioning well and he had 0% of atrial fibrillation of interrogation.  He states that he has been dealing with a lot of congestion from acute and chronic sinusitis and just finished second course of antibiotic and low-dose of steroids with some improvement. He stopped taking Zoloft because he felt like it's adding to his problem. He otherwise denies any chest pain or shortness of breath no lower extremity edema, no orthopnea or paroxysmal nocturnal dyspnea. He sleeps with C Pap machine at night. He is compliant with his medications and has no bleeding from Coumadin.  Past Medical History  Diagnosis Date  . Hernia   . Diverticulitis  of colon 15 years ago  . Hyperlipidemia   . Hypogonadism male   . Obesity   . Hypothyroidism   . Persistent atrial fibrillation ([place])     a. sustained 160-180 on e-CARDIO monitor placed 07/29/2013. Placed on IV amiodarone at end of May 2015 due to continued paroxysms with RVR.  Marland Kitchen Rheumatic heart disease   . Mitral stenosis     a. Dx 07/2013 - through workup of 2D echo, TEE, and cath, felt to be moderate.  Marland Kitchen CAD (coronary artery disease)     a. By cath 08/2013: 70% distal cx-prox LPDA; tandem mod LAD lesions, o/w mild dz.  . Renal mass     a. Dx 08/2013: concerning for renal cancer.  Marland Kitchen HTN (hypertension)   . Stroke [facility]) 1998    "MRI showed 3 mild strokes"  . Borderline diabetes   . Depression   . Anxiety   . Deafness in left ear   . Lung nodule seen on imaging study 04/10/2014    5 mm nodule right lower lobe  . OSA on CPAP   . CKD (chronic kidney disease), stage II     his creat. runs 2-2.5; hasnt seen neph yet - sees dr. Tresa [facility] at [facility]  . Renal cell carcinoma of right kidney ([place]) 09/05/2013    clear cell renal cell carcinoma of right kidney treated by radical nephrectomy, Fuhrman grade 2-3, with maximum tumor diameter 2.7 cm, all tumor to find confined to the kidney, and all surgical margins negative (T1aN0).   . S/P minimally invasive mitral valve replacement with metallic valve and maze  procedure 05/15/2014    40mm Sorin Carbomedics Optiform mechanical valve placed via right mini thoracotomy approach  . S/P Minimally invasive maze operation for atrial fibrillation 05/15/2014    Left side lesion set using cryothermy with clipping of LA appendage  . Hx of echocardiogram     post MVR >> Echo 4/16:  Mild LVH, EF 45-50%, apical dyskinesis, trivial AI, mechanical MVR functioning appropriately, severe LAE  . Complete heart block (HCC)     a. s/p PPM     Past Surgical History  Procedure Laterality Date  . Tee without cardioversion N/A 08/16/2013    Procedure: TRANSESOPHAGEAL  ECHOCARDIOGRAM (TEE);  Surgeon: Dorothy Spark, MD;  Location: [place];  Service: Cardiovascular;  Laterality: N/A;  . Inguinal hernia repair Bilateral first one in 80's    "I had one side done twice"  . Cardiac catheterization  08/28/13  . Eye surgery Right 1990    "reconstructive, lens implant- from paintball accident"  . Robot assisted laparoscopic nephrectomy Right 09/05/2013    Procedure: ROBOTIC ASSISTED LAPAROSCOPIC RIGHT RADICAL NEPHRECTOMY;  Surgeon: Sharyn Creamer, MD;  [facility];  Service: Urology;  Laterality: Right;  . Left and right heart catheterization with coronary angiogram N/A 08/28/2013    Procedure: LEFT AND RIGHT HEART CATHETERIZATION WITH CORONARY ANGIOGRAM;  Surgeon: Leonie Man, MD;  Location: [facility] CATH LAB;  Service: Cardiovascular;  Laterality: N/A;  . Mitral valve replacement Right 05/15/2014    Procedure: MINIMALLY INVASIVE MITRAL VALVE (MV) REPLACEMENT;  Surgeon: Rexene Alberts, MD;  Location: [place];  Service: Open Heart Surgery;  Laterality: Right;  . Minimally invasive maze procedure N/A 05/15/2014    Procedure: MINIMALLY INVASIVE MAZE PROCEDURE;  Surgeon: Rexene Alberts, MD;  Location: [place];  Service: Open Heart Surgery;  Laterality: N/A;  . Tee without cardioversion N/A 05/15/2014    Procedure: TRANSESOPHAGEAL ECHOCARDIOGRAM (TEE);  Surgeon: Rexene Alberts, MD;  Location: [place];  Service: Open Heart Surgery;  Laterality: N/A;  . Permanent pacemaker insertion Left 05/22/2014    Procedure: PERMANENT PACEMAKER INSERTION;  Surgeon: Thompson Grayer, MD;  Location: [facility] CATH LAB;  Service: Cardiovascular;  Laterality: Left;    Current Medications: Outpatient Prescriptions Prior to Visit  Medication Sig Dispense Refill  . acetaminophen (TYLENOL) 325 MG tablet Take 325 mg by mouth every 6 (six) hours as needed (pain).     Marland Kitchen ALPRAZolam (XANAX) 0.5 MG tablet Take 0.5-1 mg by mouth at bedtime as needed for anxiety.     Marland Kitchen amLODipine (NORVASC) 10 MG tablet Take  1 tablet (10 mg total) by mouth daily. 90 tablet 3  . aspirin EC 81 MG tablet Take 1 tablet (81 mg total) by mouth daily.    Marland Kitchen levothyroxine (SYNTHROID, LEVOTHROID) 75 MCG tablet TAKE 1 TABLET EVERY DAY BEFORE BREAKFAST 90 tablet 1  . metoprolol (LOPRESSOR) 50 MG tablet Take 1.5 tablets (75 mg total) by mouth 2 (two) times daily. 270 tablet 3  . omega-3 acid ethyl esters (LOVAZA) 1 G capsule Take 2 capsules (2 g total) by mouth 2 (two) times daily. 360 capsule 3  . warfarin (COUMADIN) 5 MG tablet Take 1-1.5 tablets (5-7.5 mg total) by mouth daily. As directed by Anticoagulation Clinic 40 tablet 3  . sertraline (ZOLOFT) 50 MG tablet Take 1 tablet (50 mg total) by mouth daily. (Patient not taking: Reported on 09/10/2015) 90 tablet 4   No facility-administered medications prior to visit.     Allergies:   Contrast media; Ioxaglate;  Atorvastatin; Crestor; Fenofibrate; and Pravastatin   Social History   Social History  . Marital Status: Married    Spouse Name: N/A  . Number of Children: N/A  . Years of Education: N/A   Social History Main Topics  . Smoking status: Former Smoker -- 0.25 packs/day for 40 years    Types: Cigarettes    Quit date: 05/14/2014  . Smokeless tobacco: Never Used  . Alcohol Use: No  . Drug Use: No  . Sexual Activity: Not Currently   Other Topics Concern  . None   Social History Narrative     Family History:  The patient's family history includes Heart attack in his mother; Hypertension in his mother; Stroke in his mother.   ROS:   Please see the history of present illness.    ROS All other systems reviewed and are negative.   Physical Exam:    VS:  BP 120/60 mmHg  Pulse 59  Ht 5\' 8"  (1.727 m)  Wt 188 lb (85.276 kg)  BMI 28.59 kg/m2   GEN: Well nourished, well developed, in no acute distress HEENT: normal Neck: no JVD, no masses Cardiac: mechanical S1, normal S2, RRR; no murmurs, rubs, or gallops, no edema;  Respiratory:  clear to auscultation  bilaterally; no wheezing, rhonchi or rales GI: soft, nontender, nondistended MS: no deformity or atrophy Skin: warm and dry Neuro: No focal deficits  Psych: Alert and oriented x 3, normal affect  Wt Readings from Last 3 Encounters:  09/10/15 188 lb (85.276 kg)  08/28/15 188 lb 6.4 oz (85.458 kg)  06/25/15 189 lb 3.2 oz (85.821 kg)      Studies/Labs Reviewed:     EKG:  EKG is  ordered today.  The ekg ordered today demonstrates A paced, HR 60, LAD, IVCD, NSSTTW changes, QTc 428 ms  Recent Labs: 03/19/2015: ALT 22; BUN 16; Creat 1.63*; Potassium 4.4; Sodium 137  Labs at nephrologists office 06/13/15: BUN 17, creatinine 1.50, potassium 5, ALT 25  Recent Lipid Panel    Component Value Date/Time   CHOL 159 03/19/2015 0756   TRIG 272* 03/19/2015 0756   HDL 19* 03/19/2015 0756   CHOLHDL 8.4* 03/19/2015 0756   VLDL 54* 03/19/2015 0756   LDLCALC 86 03/19/2015 0756   LDLDIRECT 62.0 12/13/2014 0845    Additional studies/ records that were reviewed today include:    Echo 7/16 Mild LVH, EF 50-55%, normal wall motion, trivial AI, mechanical MVR with normal function, mean gradient 4 mmHg, mild to moderate LAE, normal RV function  Echo 4/16 Mild LVH, EF 45-50%, apical dyskinesis, trivial AI, mechanical MVR okay, severe LAE  Carotid US 04/16/14 Bilateral - 1% to 39% ICA stensosis.  TEE [id number] Normal systolic function, normal wall motion, mild AI, moderate LAE Impressions: Rheumatic mitral valve with moderate leaflet tip thickening and minimal calcifications. Moderate mitral stenosis and trace mitral regurgitation.  Cardiac cath 08/28/13 LM: Normal LAD: Proximal 40-50%, distal 50-60% LCx: Small OM 70-80% (not amenable to PCI or CABG), PDA 70% LPDA: 10-20% RCA: Mid 30-40%   ASSESSMENT:     1. Essential hypertension   2. S/P minimally invasive mitral valve replacement with metallic valve and maze procedure   3. Coronary artery disease due to lipid rich plaque   4. Sleep  apnea   5. Paroxysmal atrial fibrillation (HCC)   6. Hyperlipidemia   7. Complete heart block ([place])   8. Acute on chronic diastolic CHF (congestive heart failure), NYHA class 3 ([place])  PLAN:     In order of problems listed above:  1. HTN - Controlled on current regimen.  2. S/p Mechanical MVR - Echo in 7/16 with normal LV function, well functioning mechanical MVR. Continue Coumadin. Continue SBE prophylaxis.  3. PAF - Maintaining NSR. 0% atrial fibrillation on pacemaker interrogation in May 2017. Continue Coumadin.  4. HL - Continue current Rx. Statin intol.  5. CAD - No angina.  Continue ASA, beta-blocker.  6. OSA - Continue CPAP.  7. Chronic sinusitis, recommended to take Claritin 10 mg daily and prescribe Flonase 2 puffs once daily.  Medication Adjustments/Labs and Tests Ordered: Current medicines are reviewed at length with the patient today.  Concerns regarding medicines are outlined above.  Medication changes, Labs and Tests ordered today are outlined in the Patient Instructions noted below. Patient Instructions  Medication Instructions:   START USING FLONASE NASAL SPRAY--2 SPRAYS IN BOTH NOSTRILS DAILY-YOU CAN PURCHASE THIS MEDICATION AT A CHEAPER PRICE OTC AT YOUR LOCAL PHARMACY    START USING CLARITIN 10 MG ONCE DAILY---YOU CAN PURCHASE THIS MEDICATION AT A CHEAPER PRICE OTC AT YOUR LOCAL PHARMACY    Follow-Up:  Your physician wants you to follow-up in: [place] will receive a reminder letter in the mail two months in advance. If you don't receive a letter, please call our office to schedule the follow-up appointment.        If you need a refill on your cardiac medications before your next appointment, please call your pharmacy.     Signed, Ena Dawley, MD  09/10/2015 10:02 AM    [address], [place], [place]  [address] Phone: [phone]; Fax: [phone]

## 2015-09-18 ENCOUNTER — Other Ambulatory Visit: Payer: Self-pay | Admitting: Cardiology

## 2015-09-18 NOTE — Telephone Encounter (Signed)
Please advise on refill request. Patient does not have an updated tsh in epic. Thanks, MI

## 2015-09-25 ENCOUNTER — Other Ambulatory Visit: Payer: Self-pay | Admitting: Urology

## 2015-09-25 ENCOUNTER — Ambulatory Visit (HOSPITAL_COMMUNITY)
Admission: RE | Admit: 2015-09-25 | Discharge: 2015-09-25 | Disposition: A | Discharge status: Home / Self Care | Payer: Managed Care, Other (non HMO) | Source: Ambulatory Visit | Attending: Urology | Admitting: Urology

## 2015-09-25 DIAGNOSIS — C641 Malignant neoplasm of right kidney, except renal pelvis: Secondary | ICD-10-CM | POA: Diagnosis not present

## 2015-09-25 DIAGNOSIS — J984 Other disorders of lung: Secondary | ICD-10-CM | POA: Insufficient documentation

## 2015-10-17 ENCOUNTER — Other Ambulatory Visit (HOSPITAL_COMMUNITY): Payer: Self-pay | Admitting: *Deleted

## 2015-10-18 ENCOUNTER — Ambulatory Visit (HOSPITAL_COMMUNITY)
Admission: RE | Admit: 2015-10-18 | Discharge: 2015-10-18 | Disposition: A | Discharge status: Home / Self Care | Payer: Managed Care, Other (non HMO) | Source: Ambulatory Visit | Attending: Nephrology | Admitting: Nephrology

## 2015-10-18 DIAGNOSIS — D751 Secondary polycythemia: Secondary | ICD-10-CM | POA: Diagnosis present

## 2015-10-18 NOTE — Progress Notes (Signed)
250 cc phlebotomy complete using left antecub.; pt tolerated well

## 2015-10-22 ENCOUNTER — Ambulatory Visit (INDEPENDENT_AMBULATORY_CARE_PROVIDER_SITE_OTHER): Payer: Managed Care, Other (non HMO) | Admitting: *Deleted

## 2015-10-22 DIAGNOSIS — Z5181 Encounter for therapeutic drug level monitoring: Secondary | ICD-10-CM | POA: Diagnosis not present

## 2015-10-22 DIAGNOSIS — I5033 Acute on chronic diastolic (congestive) heart failure: Secondary | ICD-10-CM | POA: Diagnosis not present

## 2015-10-22 DIAGNOSIS — Z954 Presence of other heart-valve replacement: Secondary | ICD-10-CM | POA: Diagnosis not present

## 2015-10-22 DIAGNOSIS — I4891 Unspecified atrial fibrillation: Secondary | ICD-10-CM

## 2015-10-22 LAB — POCT INR: INR: 3.3

## 2015-11-13 ENCOUNTER — Telehealth: Payer: Self-pay | Admitting: Hematology

## 2015-11-13 ENCOUNTER — Encounter: Payer: Self-pay | Admitting: Hematology

## 2015-11-13 NOTE — Telephone Encounter (Signed)
Appointment scheduled with the patient's spouse, Santiago Glad. Scheduled with Kale on 9/12 at 11am. Aware to arrive 30 minutes early. Letter to the referring and mailed to the patient. Demographics verified.

## 2015-11-26 IMAGING — CR DG CHEST 1V PORT
1 series · 1 of 1 positions shown · non-contrast
Comparison: Portable chest x-ray May 17, 2014

CLINICAL DATA: Minimally invasive mitral valve replacement history
of CHF, current tobacco use

EXAM:
PORTABLE CHEST - 1 VIEW

[portable]
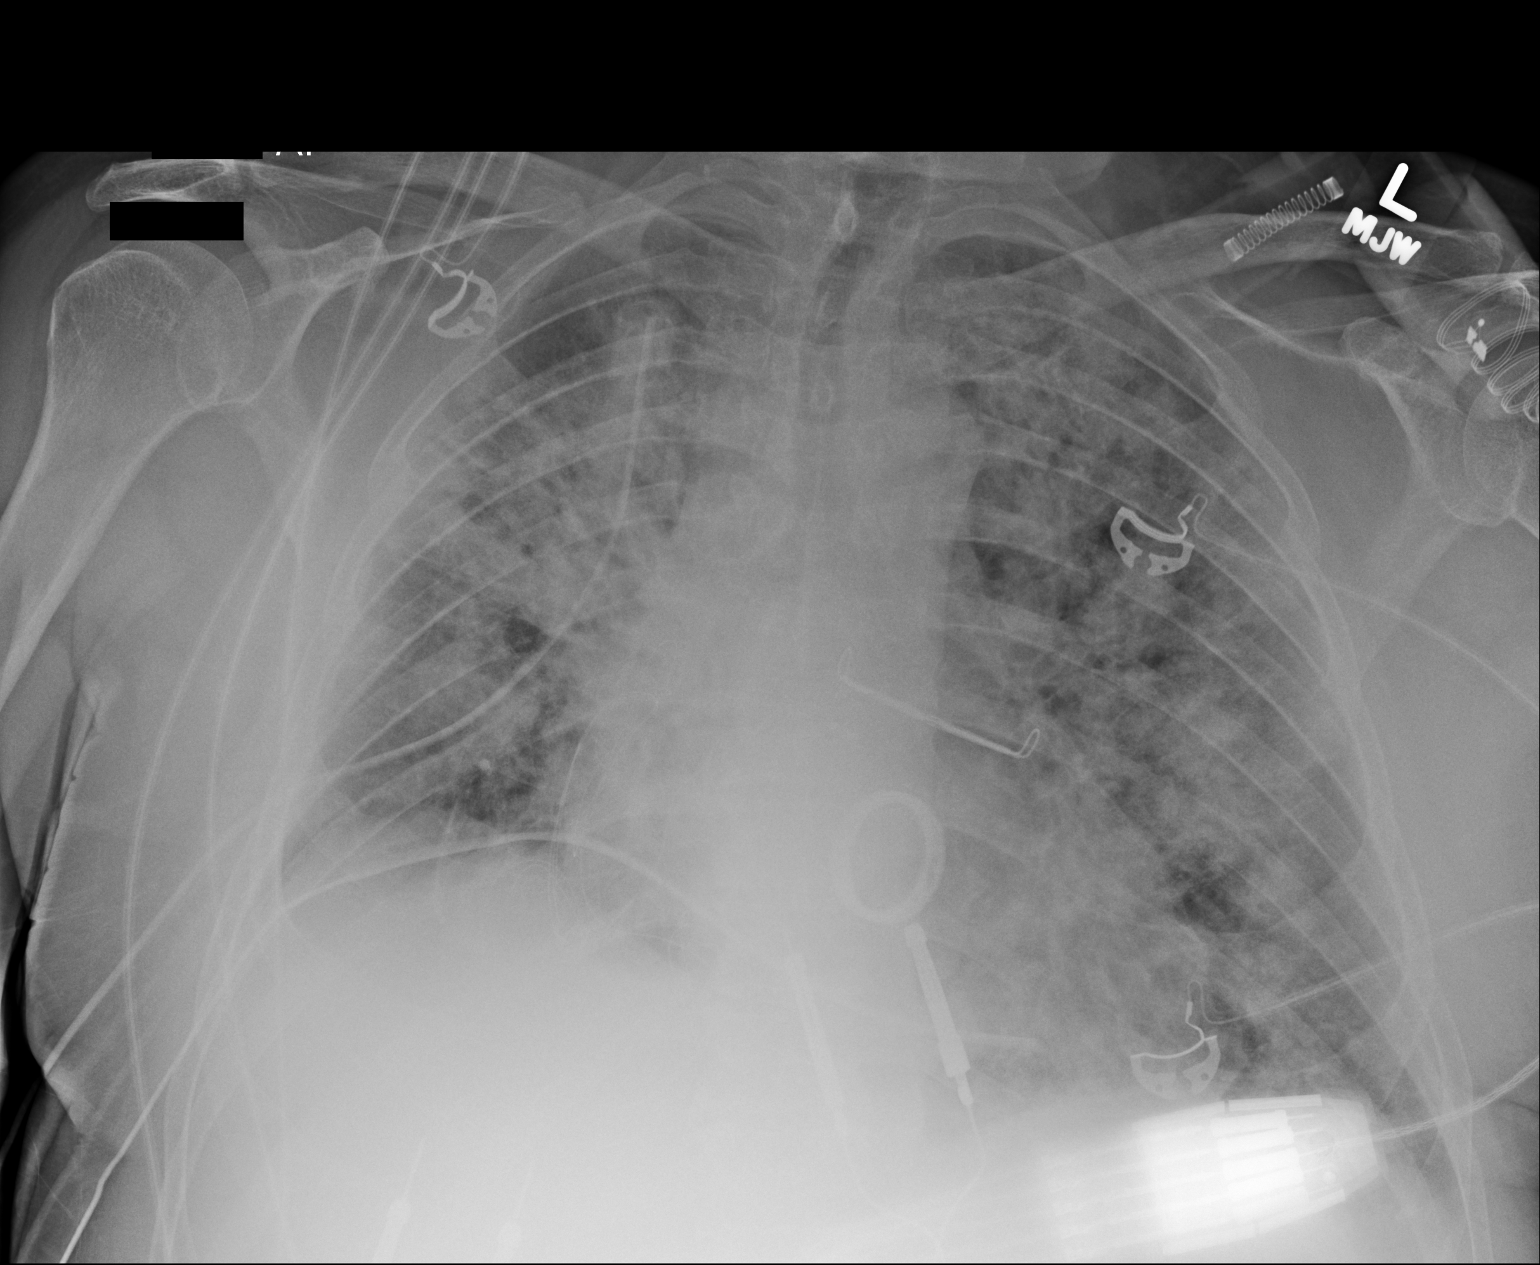

[1 of 1 positions shown; findings below may reference images not displayed]

FINDINGS: There has been interval worsening of the bilateral confluent
alveolar densities. The cardiac silhouette is mildly enlarged though
stable. The pulmonary vascularity is engorged and indistinct. A left
atrial appendage clip and mitral prosthetic mitral valve ring are
demonstrated. The 2 right-sided chest tubes are unchanged. There is
no pleural effusion. The tiny right apical pneumothorax is not
clearly evident today.
IMPRESSION: Worsening of bilateral confluent alveolar opacities consistent with
pulmonary edema or pneumonia. ARDS is in the differential but it is
somewhat early in the patient's postoperative course. The chest
tubes are unchanged in position.

## 2015-11-27 ENCOUNTER — Ambulatory Visit (INDEPENDENT_AMBULATORY_CARE_PROVIDER_SITE_OTHER): Payer: Managed Care, Other (non HMO) | Admitting: *Deleted

## 2015-11-27 ENCOUNTER — Telehealth: Payer: Self-pay | Admitting: Cardiology

## 2015-11-27 DIAGNOSIS — I442 Atrioventricular block, complete: Secondary | ICD-10-CM | POA: Diagnosis not present

## 2015-11-27 IMAGING — CR DG CHEST 1V PORT
1 series · 1 of 1 positions shown · non-contrast
Comparison: Same day.

CLINICAL DATA: Central line placement.

EXAM:
PORTABLE CHEST - 1 VIEW

[AP]
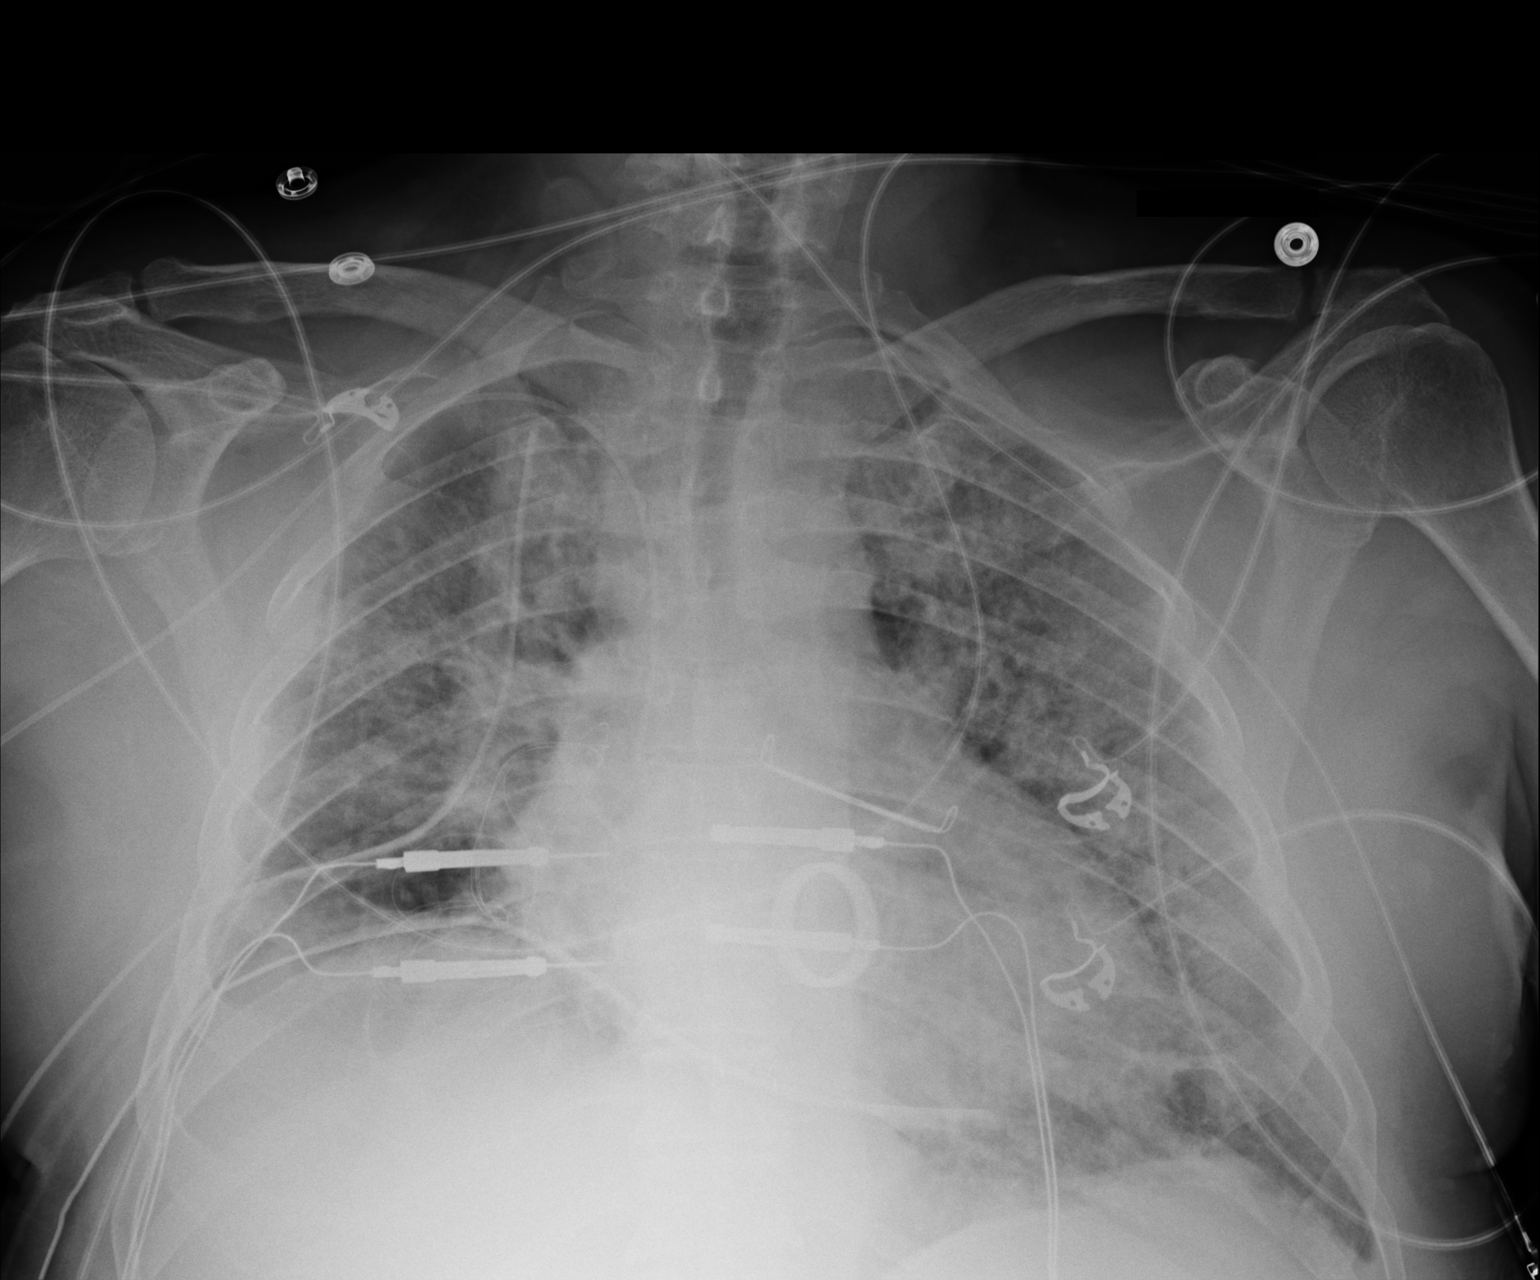

[1 of 1 positions shown; findings below may reference images not displayed]

FINDINGS: Stable cardiomediastinal silhouette. No pneumothorax or significant
pleural effusion is noted. Right-sided chest tube is noted and
unchanged in position. Stable diffuse opacities are noted throughout
both lungs. Interval placement of right-sided PICC line with distal
tip overlying expected position of the SVC.
IMPRESSION: Stable bilateral diffuse opacities consistent with pneumonia or
edema. Right-sided chest tube is unchanged with no evidence of
pneumothorax. Interval placement of right-sided PICC line with
distal tip overlying the expected position of the SVC.

## 2015-11-27 NOTE — Telephone Encounter (Signed)
Confirmed remote transmission w/ pt wife.   

## 2015-11-27 NOTE — Progress Notes (Signed)
Remote pacemaker transmission.   

## 2015-11-29 ENCOUNTER — Encounter: Payer: Self-pay | Admitting: Cardiology

## 2015-11-29 IMAGING — CR DG CHEST 1V PORT
1 series · 1 of 1 positions shown · non-contrast
Comparison: 05/20/2014.

CLINICAL DATA: Pneumonia.  Follow-up radiographs.

EXAM:
PORTABLE CHEST - 1 VIEW

[AP]
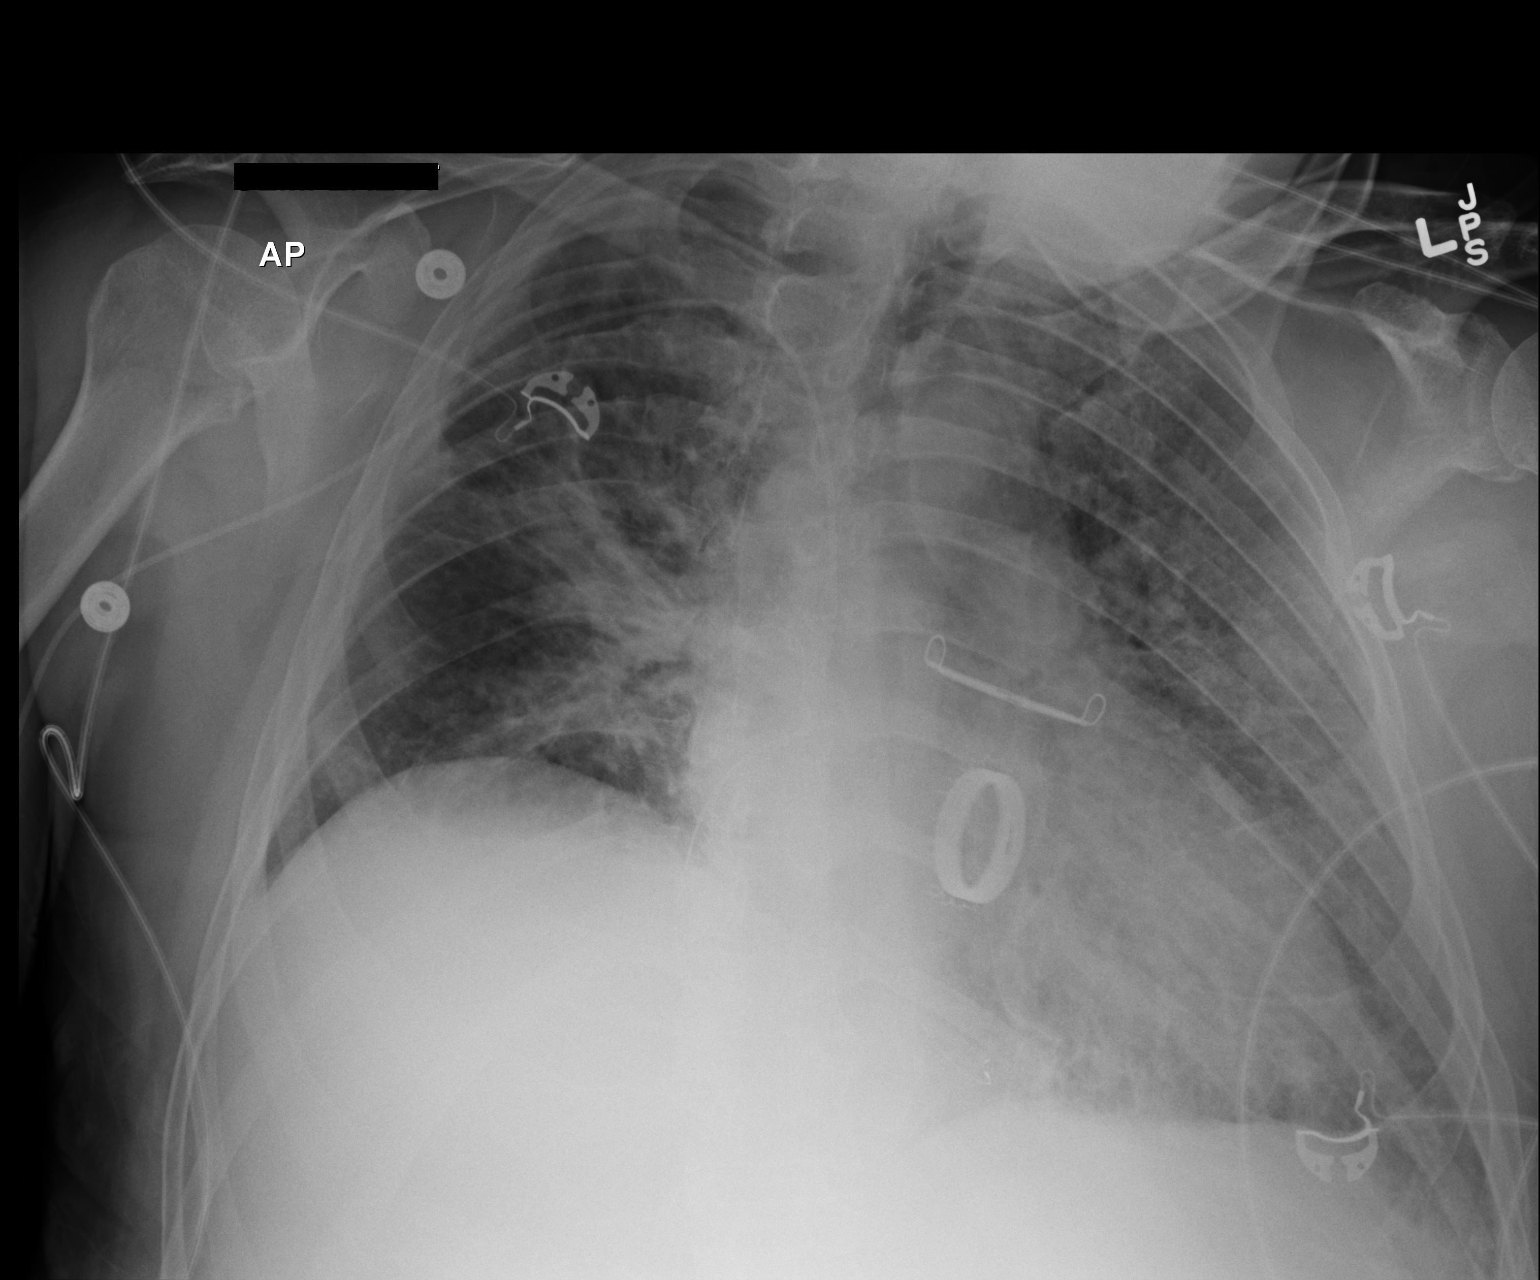

[1 of 1 positions shown; findings below may reference images not displayed]

FINDINGS: Support apparatus: The RIGHT thoracostomy tube has been removed.
RIGHT upper extremity PICC remains present. Monitoring leads project
over the chest. Epicardial pacing leads remain present.

Cardiomediastinal Silhouette: Mildly enlarged with heart size
accentuated by rotation and suboptimal inspiration. Prostatic mitral
valve is present. LEFT atrial clip.

Lungs: Perihilar atelectasis on the RIGHT. Diffuse hazy opacity is
present throughout the LEFT lung which appears similar to the prior
exam. No pneumothorax.

Effusions:  None.

Other:  None.
IMPRESSION: 1. Interval removal of RIGHT thoracostomy tube.  No pneumothorax.
2. Unchanged RIGHT upper extremity PICC.
3. Lower lung volumes than on prior with slightly increased
atelectasis.
4. Postsurgical changes of mitral valve replacement and LEFT atrial
clip.

## 2015-11-30 ENCOUNTER — Other Ambulatory Visit: Payer: Self-pay | Admitting: Cardiology

## 2015-12-01 IMAGING — CR DG CHEST 1V PORT
1 series · 1 of 1 positions shown · non-contrast
Comparison: Portable chest x-ray of 05/21/2014

CLINICAL DATA: Minimally invasive mitral valve replacement,
followup

EXAM:
PORTABLE CHEST - 1 VIEW

[AP]
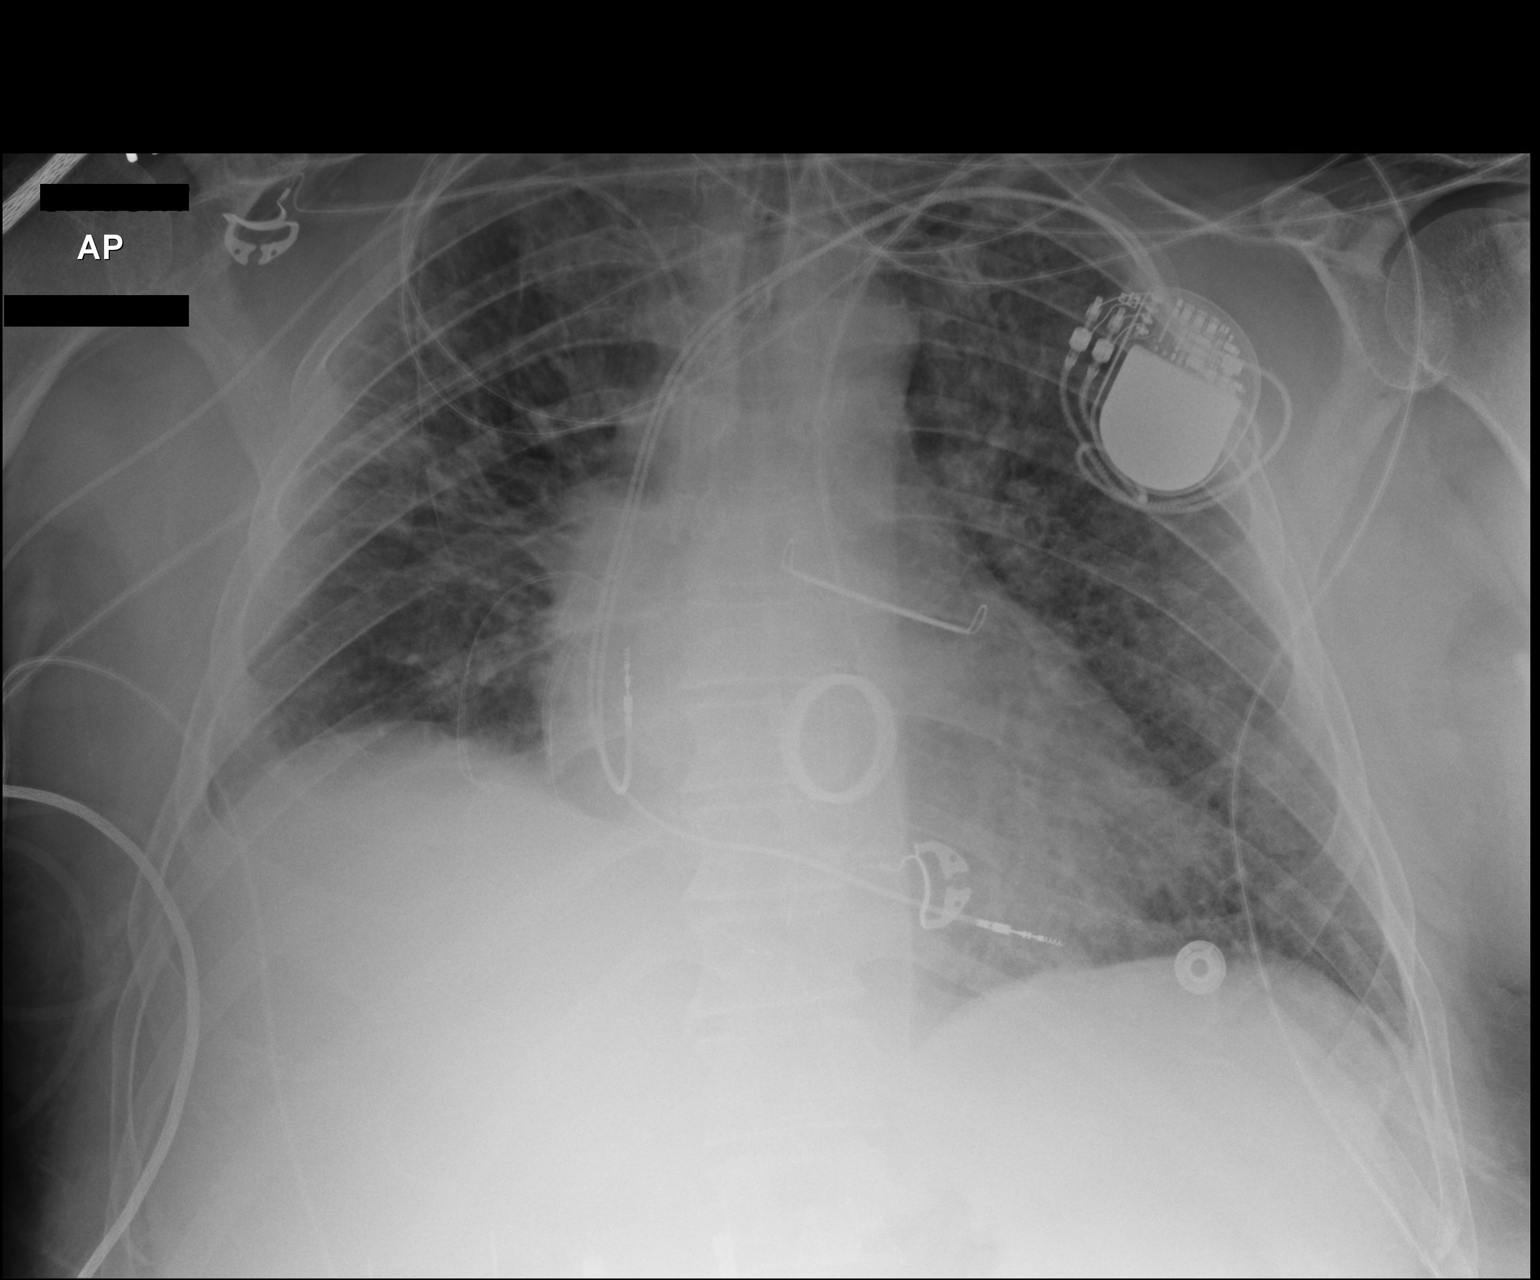

[1 of 1 positions shown; findings below may reference images not displayed]

FINDINGS: Aeration has improved, particularly with decrease in haziness
overlying the right mid lung and right perihilar region. No focal
infiltrate or effusion is seen. Mild cardiomegaly is stable. Mitral
valve replacement and left atrial appendage exclusion device remain.
Pacemaker wires are now present. Right central venous line tip
overlies the lower SVC.
IMPRESSION: 1. Improvement in right mid lung opacity most consistent with
improving atelectasis.
2. Permanent pacemaker now present.
3. Right PICC line tip overlies the lower SVC.

## 2015-12-03 ENCOUNTER — Telehealth: Payer: Self-pay | Admitting: Hematology

## 2015-12-03 ENCOUNTER — Ambulatory Visit (HOSPITAL_BASED_OUTPATIENT_CLINIC_OR_DEPARTMENT_OTHER): Payer: Managed Care, Other (non HMO)

## 2015-12-03 ENCOUNTER — Ambulatory Visit (INDEPENDENT_AMBULATORY_CARE_PROVIDER_SITE_OTHER): Payer: Managed Care, Other (non HMO) | Admitting: Cardiology

## 2015-12-03 ENCOUNTER — Ambulatory Visit (HOSPITAL_BASED_OUTPATIENT_CLINIC_OR_DEPARTMENT_OTHER): Payer: Managed Care, Other (non HMO) | Admitting: Hematology

## 2015-12-03 ENCOUNTER — Encounter: Payer: Self-pay | Admitting: Hematology

## 2015-12-03 VITALS — BP 116/71 | HR 59 | Temp 98.4°F | Resp 18 | Ht 68.0 in | Wt 185.2 lb

## 2015-12-03 DIAGNOSIS — I5033 Acute on chronic diastolic (congestive) heart failure: Secondary | ICD-10-CM

## 2015-12-03 DIAGNOSIS — Z954 Presence of other heart-valve replacement: Secondary | ICD-10-CM

## 2015-12-03 DIAGNOSIS — D751 Secondary polycythemia: Secondary | ICD-10-CM

## 2015-12-03 DIAGNOSIS — D72821 Monocytosis (symptomatic): Secondary | ICD-10-CM | POA: Diagnosis not present

## 2015-12-03 DIAGNOSIS — D72820 Lymphocytosis (symptomatic): Secondary | ICD-10-CM

## 2015-12-03 DIAGNOSIS — D72829 Elevated white blood cell count, unspecified: Secondary | ICD-10-CM

## 2015-12-03 DIAGNOSIS — Z5181 Encounter for therapeutic drug level monitoring: Secondary | ICD-10-CM

## 2015-12-03 DIAGNOSIS — I4891 Unspecified atrial fibrillation: Secondary | ICD-10-CM

## 2015-12-03 LAB — CBC & DIFF AND RETIC
BASO%: 1.3 % (ref 0.0–2.0)
BASOS ABS: 0.2 10*3/uL — AB (ref 0.0–0.1)
EOS ABS: 0.2 10*3/uL (ref 0.0–0.5)
EOS%: 1.3 % (ref 0.0–7.0)
HCT: 56.6 % — ABNORMAL HIGH (ref 38.4–49.9)
HEMOGLOBIN: 19 g/dL — AB (ref 13.0–17.1)
IMMATURE RETIC FRACT: 10.6 % (ref 3.00–10.60)
LYMPH#: 4.4 10*3/uL — AB (ref 0.9–3.3)
LYMPH%: 26.1 % (ref 14.0–49.0)
MCH: 32.6 pg (ref 27.2–33.4)
MCHC: 33.5 g/dL (ref 32.0–36.0)
MCV: 97.3 fL (ref 79.3–98.0)
MONO#: 1.7 10*3/uL — AB (ref 0.1–0.9)
MONO%: 9.8 % (ref 0.0–14.0)
NEUT#: 10.4 10*3/uL — ABNORMAL HIGH (ref 1.5–6.5)
NEUT%: 61.5 % (ref 39.0–75.0)
PLATELETS: 242 10*3/uL (ref 140–400)
RBC: 5.81 10*6/uL (ref 4.20–5.82)
RDW: 15.4 % — ABNORMAL HIGH (ref 11.0–14.6)
RETIC CT ABS: 0.13 10*3/uL — AB (ref 34.80–93.90)
Retic %: 2.26 % — ABNORMAL HIGH (ref 0.80–1.80)
WBC: 16.8 10*3/uL — ABNORMAL HIGH (ref 4.0–10.3)

## 2015-12-03 LAB — COMPREHENSIVE METABOLIC PANEL
ALBUMIN: 3.8 g/dL (ref 3.5–5.0)
ALK PHOS: 98 U/L (ref 40–150)
ALT: 31 U/L (ref 0–55)
ANION GAP: 8 meq/L (ref 3–11)
AST: 21 U/L (ref 5–34)
BILIRUBIN TOTAL: 0.41 mg/dL (ref 0.20–1.20)
BUN: 15.4 mg/dL (ref 7.0–26.0)
CO2: 24 meq/L (ref 22–29)
CREATININE: 1.5 mg/dL — AB (ref 0.7–1.3)
Calcium: 10.6 mg/dL — ABNORMAL HIGH (ref 8.4–10.4)
Chloride: 108 mEq/L (ref 98–109)
EGFR: 48 mL/min/{1.73_m2} — AB (ref >90)
GLUCOSE: 69 mg/dL — AB (ref 70–140)
Potassium: 4.8 mEq/L (ref 3.5–5.1)
Sodium: 140 mEq/L (ref 136–145)
TOTAL PROTEIN: 7.7 g/dL (ref 6.4–8.3)

## 2015-12-03 LAB — PROTIME-INR
INR: 3.5 (ref 2.00–3.50)
Protime: 42 Seconds — ABNORMAL HIGH (ref 10.6–13.4)

## 2015-12-03 LAB — LACTATE DEHYDROGENASE: LDH: 351 U/L — ABNORMAL HIGH (ref 125–245)

## 2015-12-03 NOTE — Progress Notes (Signed)
Marland Kitchen    HEMATOLOGY/ONCOLOGY CONSULTATION NOTE  Date of Service: 12/03/2015  Patient Care Team: Raina Mina, MD as PCP - General (Internal Medicine) Dr Marval Regal (nephrology) Dr Ena Dawley -cardiology  Dr Tresa Moore (urology)  CHIEF COMPLAINTS/PURPOSE OF CONSULTATION:  Erythrocytosis and leukocytosis  HISTORY OF PRESENTING ILLNESS:   Kevin English is a wonderful 60 y.o. male who has been referred to [place] by Dr .Gilford Rile, MD and Dr Marval Regal MD for evaluation and management of erythrocytosis and leukocytosis.  Patient has a history of hypertension, diabetes, coronary artery disease status post PCI, chronic kidney disease, we talked block status post permanent pacemaker, rheumatic heart disease with mitral valve replacement(mechanical mitral valve), hypogonadism hypothyroidism, persistent atrial fibrillation, smoker with 4-5 cigars a day, sleep apnea using CPAP (has not had his CPAP settings checked for nearly 10 years or more).  Patient also has a history of renal cell carcinoma diagnosed in June 2015 Patient also reports issues with anxiety and depression.  Patient is being followed up closely by nephrology and had recent labs on 11/08/2015 showed a hemoglobin of 18.1 and a hematocrit of 52.7 with a normal platelet count of 234k and mild leukocytosis of 15.9k with  9.9k neutrophils and 4.5k lymphocytes.  Previous labs in June 2017 showed a hemoglobin of 19.3 with hematocrit of 56.1 and a WBC count of 19.9k with normal platelets. Looking back and labs available in our system his CBC from 06/02/2014 showed a normal hemoglobin of 13.5 with hematocrit of 41.2 and platelet count of 392k with leukocytosis of 15.9 k.  Patient notes he has previously been on testosterone replacement or a few months in 2011/2012 . Notes that his CPAP settings have not been reevaluated for more than 10 years. Smokes about 4-5 cigars a day.  No chest pain or shortness of breath no new headaches or focal  neurological deficits. Patient reports no new fatigue.  He is rather dismissive of his lab findings and is quite resistant to detailed discussions about possibilities of his condition. He feels that his wife is overly concerned about him and he he just wants to be left alone.   MEDICAL HISTORY:  Past Medical History:  Diagnosis Date  . Anxiety   . Borderline diabetes   . CAD (coronary artery disease)    a. By cath 08/2013: 70% distal cx-prox LPDA; tandem mod LAD lesions, o/w mild dz.  . CKD (chronic kidney disease), stage II    his creat. runs 2-2.5; hasnt seen neph yet - sees dr. [facility] at [facility]  . Complete heart block (HCC)    a. s/p PPM   . Deafness in left ear   . Depression   . Diverticulitis of colon 15 years ago  . Hernia   . HTN (hypertension)   . Hx of echocardiogram    post MVR >> Echo 4/16:  Mild LVH, EF 45-50%, apical dyskinesis, trivial AI, mechanical MVR functioning appropriately, severe LAE  . Hyperlipidemia   . Hypogonadism male   . Hypothyroidism   . Lung nodule seen on imaging study 04/10/2014   5 mm nodule right lower lobe  . Mitral stenosis    a. Dx 07/2013 - through workup of 2D echo, TEE, and cath, felt to be moderate.  . Obesity   . OSA on CPAP   . Persistent atrial fibrillation ([place])    a. sustained 160-180 on e-CARDIO monitor placed 07/29/2013. Placed on IV amiodarone at end of May 2015 due to continued paroxysms with RVR.  Marland Kitchen  Renal cell carcinoma of right kidney ([place]) 09/05/2013   clear cell renal cell carcinoma of right kidney treated by radical nephrectomy, Fuhrman grade 2-3, with maximum tumor diameter 2.7 cm, all tumor to find confined to the kidney, and all surgical margins negative (T1aN0).   . Renal mass    a. Dx 08/2013: concerning for renal cancer.  . Rheumatic heart disease   . S/P Minimally invasive maze operation for atrial fibrillation 05/15/2014   Left side lesion set using cryothermy with clipping of LA appendage  . S/P minimally invasive  mitral valve replacement with metallic valve and maze procedure 05/15/2014   75m Sorin [employer]omed[employer] Optiform mechanical valve placed via right mini thoracotomy approach  . Stroke ([facility] 1998   "MRI showed 3 mild strokes"    SURGICAL HISTORY: Past Surgical History:  Procedure Laterality Date  . CARDIAC CATHETERIZATION  08/28/13  . EYE SURGERY Right 1990   "reconstructive, lens implant- from paintball accident"  . INGUINAL HERNIA REPAIR Bilateral first one in 80's   "I had one side done twice"  . LEFT AND RIGHT HEART CATHETERIZATION WITH CORONARY ANGIOGRAM N/A 08/28/2013   Procedure: LEFT AND RIGHT HEART CATHETERIZATION WITH CORONARY ANGIOGRAM;  Surgeon: DLeonie Man MD;  [facility];  Service: Cardiovascular;  Laterality: N/A;  . MINIMALLY INVASIVE MAZE PROCEDURE N/A 05/15/2014   Procedure: MINIMALLY INVASIVE MAZE PROCEDURE;  Surgeon: CRexene Alberts MD;  [place] [facility]  Service: Open Heart Surgery;  Laterality: N/A;  . MITRAL VALVE REPLACEMENT Right 05/15/2014   Procedure: MINIMALLY INVASIVE MITRAL VALVE (MV) REPLACEMENT;  Surgeon: CRexene Alberts MD;  [place] [facility]  Service: Open Heart Surgery;  Laterality: Right;  . PERMANENT PACEMAKER INSERTION Left 05/22/2014   Procedure: PERMANENT PACEMAKER INSERTION;  Surgeon: JThompson Grayer MD;  [facility];  Service: Cardiovascular;  Laterality: Left;  . ROBOT ASSISTED LAPAROSCOPIC NEPHRECTOMY Right 09/05/2013   Procedure: ROBOTIC ASSISTED LAPAROSCOPIC RIGHT RADICAL NEPHRECTOMY;  Surgeon: DSharyn Creamer MD;  Location[facility];  Service: Urology;  Laterality: Right;  . TEE WITHOUT CARDIOVERSION N/A 08/16/2013   Procedure: TRANSESOPHAGEAL ECHOCARDIOGRAM (TEE);  Surgeon: KDorothy Spark MD;  [place] [facility]  Service: Cardiovascular;  Laterality: N/A;  . TEE WITHOUT CARDIOVERSION N/A 05/15/2014   Procedure: TRANSESOPHAGEAL ECHOCARDIOGRAM (TEE);  Surgeon: CRexene Alberts MD;  [place]  Service: Open Heart Surgery;   Laterality: N/A;    SOCIAL HISTORY: Social History   Social History  . Marital status: Married    Spouse name: N/A  . Number of children: N/A  . Years of education: N/A   Occupational History  . Not on file.   Social History Main Topics  . Smoking status: Former Smoker    Packs/day: 0.25    Years: 40.00    Types: Cigarettes    Quit date: 05/14/2014  . Smokeless tobacco: Never Used  . Alcohol use No  . Drug use: No  . Sexual activity: Not Currently   Other Topics Concern  . Not on file   Social History Narrative  . No narrative on file    FAMILY HISTORY: Family History  Problem Relation Age of Onset  . Hypertension Mother   . Heart attack Mother   . Stroke Mother   . Hypertension      ALLERGIES:  is allergic to contrast media [iodinated diagnostic agents]; ioxaglate; atorvastatin; crestor [rosuvastatin]; fenofibrate; and pravastatin.  MEDICATIONS:  Current Outpatient Prescriptions  Medication Sig Dispense Refill  . acetaminophen (TYLENOL)  325 MG tablet Take 325 mg by mouth every 6 (six) hours as needed (pain).     Marland Kitchen ALPRAZolam (XANAX) 0.5 MG tablet Take 0.5-1 mg by mouth at bedtime as needed for anxiety.     Marland Kitchen amLODipine (NORVASC) 10 MG tablet Take 1 tablet (10 mg total) by mouth daily. 90 tablet 3  . aspirin EC 81 MG tablet Take 1 tablet (81 mg total) by mouth daily.    . fluticasone (FLONASE) 50 MCG/ACT nasal spray Place 2 sprays into both nostrils daily.  2  . levothyroxine (SYNTHROID, LEVOTHROID) 75 MCG tablet TAKE 1 TABLET EVERY DAY BEFORE BREAKFAST 90 tablet 1  . loratadine (CLARITIN) 10 MG tablet Take 1 tablet (10 mg total) by mouth daily.    . metoprolol (LOPRESSOR) 50 MG tablet Take 1.5 tablets (75 mg total) by mouth 2 (two) times daily. 270 tablet 3  . omega-3 acid ethyl esters (LOVAZA) 1 g capsule TAKE 2 CAPSULES TWICE A DAY 360 capsule 2  . sertraline (ZOLOFT) 50 MG tablet TAKE 1 TABLET (50 MG TOTAL) BY MOUTH DAILY.    Marland Kitchen warfarin (COUMADIN) 5 MG  tablet Take 1-1.5 tablets (5-7.5 mg total) by mouth daily. As directed by Anticoagulation Clinic 40 tablet 3   No current facility-administered medications for this visit.     REVIEW OF SYSTEMS:    10 Point review of Systems was done is negative except as noted above.  PHYSICAL EXAMINATION: ECOG PERFORMANCE STATUS: 2 - Symptomatic, <50% confined to bed  . Vitals:   12/03/15 1113  BP: 116/71  Pulse: (!) 59  Resp: 18  Temp: 98.4 F (36.9 C)   Filed Weights   12/03/15 1113  Weight: 185 lb 3.2 oz (84 kg)   .Body mass index is 28.16 kg/m.  GENERAL:alert, in no acute distress and comfortable SKIN: skin color, texture, turgor are normal, no rashes or significant lesions EYES: normal, conjunctiva are pink and non-injected, sclera clear OROPHARYNX:no exudate, no erythema and lips, buccal mucosa, and tongue normal  NECK: supple, no JVD, thyroid normal size, non-tender, without nodularity LYMPH:  no palpable lymphadenopathy in the cervical, axillary or inguinal LUNGS: clear to auscultation with normal respiratory effort HEART: s1s2 irreg, mitral valve click noted ABDOMEN: abdomen soft, non-tender, normoactive bowel sounds , borderline palpable spleen. Musculoskeletal: no cyanosis of digits and no clubbing  PSYCH: alert & oriented x 3 with fluent speech NEURO: no focal motor/sensory deficits  LABORATORY DATA:  I have reviewed the data as listed  . CBC Latest Ref Rng & Units 12/03/2015 05/26/2014  WBC 4.0 - 10.3 10e3/uL 16.8(H) 15.9(H)  Hemoglobin 13.0 - 17.1 g/dL 19.0(H) 13.5  Hematocrit 38.4 - 49.9 % 56.6(H) 41.2  Platelets 140 - 400 10e3/uL 242 392    . CMP Latest Ref Rng & Units 01/02/2016 12/03/2015  Glucose 70 - 140 mg/dl - 69(L)  BUN 7.0 - 26.0 mg/dL - 15.4  Creatinine 0.7 - 1.3 mg/dL - 1.5(H)  Sodium 136 - 145 mEq/L - 140  Potassium 3.5 - 5.1 mEq/L - 4.8  Chloride 98 - 110 mmol/L - -  CO2 22 - 29 mEq/L - 24  Calcium 8.6 - 10.2 mg/dL 10.5(H) 10.6(H)  Total Protein  6.0 - 8.5 g/dL 6.9 7.7  Total Bilirubin 0.20 - 1.20 mg/dL - 0.41  Alkaline Phos 40 - 150 U/L - 98  AST 5 - 34 U/L - 21  ALT 0 - 55 U/L - 31  RADIOGRAPHIC STUDIES: I have personally reviewed the radiological images as listed and agreed with the findings in the report. No results found.  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 Patient has other potential secondary etiologies for polycythemia including active smoking , possibly suboptimally treated sleep apnea. He reports no use of heating or generator to increase chance of carbon monoxide poisoning . Could have some element of dehydration with hemoconcentration .   2) Leukocytosis with neutrophilia, mild lymphocytosis and monocytosis. could be reactive versus rule out myeloproliferative neoplasm . BCR-ABL testing neg for CML. flow cytometry does not show any clonal lymphocyte population  PLAN -Patient counseled on maintaining good hydration. -We discussed about smoking cessation absolutely. -In the absence of findings of polycythemia vera no clear guidelines for HCT level for therapeutic phlebotomy but we generally might consider therapeutic phlebotomy to maintain HCT <55 if polycythemic despite addressing all his secondary factors -Patient needs to be seen by his pulmonologist for repeat sleep study to optimize treatment of his sleep apnea and also to consider PFT's to determine presence of hypoxia from pulmonary and cardiac issues that might need O2 therapy. -He was counseled on absolute smoking cessation in the setting of his known heart disease and polycythemia. -He was counseled on the symptoms to watch out for with regards to symptomatic polycythemia. he doesn't seem to have at this  time. -maintain good hydration -He is already on anticoagulation therapeutically for his mechanical mitral valve and atrial fibrillation . -Would recommend getting a renal duplex ultrasound to rule out renal artery stenosis in his remaining single kidney - will defer to nephrology. -Follow-up with urology Dr Tresa Moore to consider MRI of abd and CT chest to r/o recurrent RCC which can present with paraneoplastic polycythemia due to erythropoietin production and paraneoplastic/reactive . Leucocytosis. -If counts remain elevated despite addressing all the secondary factors might need to consider a bone marrow biopsy .  3) h/o Renal Cell carcinoma T1aNo s/p left nephrectomy in 08/2013. Was been monitored by Dr Tresa Moore. He has no clinical symptomatology suggestive of disease recurrence at this time.  4) h/o Lung nodules 50m in RLL of lung in 03/2014, CXR on 09/25/2015 shows chronic lung disease with scarring in the bases and negative for metastatic disease.  -Continue monitoring with primary care physician  5). Patient Active Problem List   Diagnosis Date Noted  . Essential hypertension 03/09/2015  . Chronic diastolic CHF (congestive heart failure), [place] 2 [place] 03/09/2015  . Coronary artery disease due to lipid rich plaque 03/09/2015  . S/P placement of cardiac pacemaker 06/12/2014  . [place] 05/22/2014  . S/P minimally invasive mitral valve replacement with metallic valve and maze procedure 05/15/2014  . S/P Minimally invasive maze operation for atrial fibrillation 05/15/2014  . Yeast dermatitis 04/19/2014  . [place]   . Lung nodule seen on imaging study 04/10/2014  . Renal neoplasm 09/05/2013  . Renal cell carcinoma of right kidney-nephrectomy June 2015 09/05/2013  . CAD (coronary artery disease)   . CKD (chronic kidney disease), stage II   . Long term (current) use of anticoagulants 08/21/2013  . Encounter for therapeutic drug monitoring 08/21/2013   . [place] fibrillation with [place]VR ([place] 08/18/2013  . Chronic anticoagulation 08/11/2013  . Paroxysmal atrial  fibrillation 08/11/2013  . Hypothyroid 08/11/2013  . Fatigue 08/11/2013  . Dyspnea on exertion 08/11/2013  . Acute on chronic diastolic CHF (congestive heart failure), NYHA class 3 ([place]) 08/09/2013  . Chronic diastolic CHF (congestive heart failure) 08/09/2013  . Rheumatic heart disease 08/02/2013  . Mitral stenosis 08/02/2013  . Other abnormal glucose   . Benign neoplasm of colon   . Hernia   . Chronic rhinitis   . Diverticulitis of colon   . Hyperlipidemia   . HTN (hypertension)   . Hypogonadism male   . Leukocytosis   . Myalgia and myositis, unspecified   . Obesity   . Sleep apnea   . Vitamin D deficiency    -Continue follow-up with primary care physician for coordination and management of all his medical co-morbidities and complex care co-ordination  Return to clinic with Dr. Irene Limbo in 3-4 weeks to discuss results and repeat labs.   I counseled the the patient and his questions were answered with apparent satisfaction. The patient knows to call the clinic with any problems, questions or concerns.  I spent 70 minutes counseling the patient face to face. The total time spent in the appointment was 80 minutes and more than 50% was on counseling and direct patient cares.    Sullivan Lone MD Jud AAHIVMS [facility] Hematology/Oncology Physician [facility]  (Office):       [phone] (Work cell):  [phone] (Fax):           [phone]  12/03/2015 12:11 PM

## 2015-12-03 NOTE — Telephone Encounter (Signed)
Avs report and schedule given per 12/03/15 los. °

## 2015-12-07 LAB — CUP PACEART REMOTE DEVICE CHECK
Battery Remaining Longevity: 156 mo
Battery Voltage: 2.78 V
Brady Statistic AS VP Percent: 0 %
Implantable Lead Implant Date: 20160301
Implantable Lead Location: 753859
Implantable Lead Model: 5076
Implantable Lead Model: 5076
Lead Channel Impedance Value: 519 Ohm
Lead Channel Pacing Threshold Pulse Width: 0.4 ms
Lead Channel Pacing Threshold Pulse Width: 0.4 ms
Lead Channel Setting Pacing Amplitude: 2 V
Lead Channel Setting Pacing Amplitude: 2.5 V
MDC IDC LEAD IMPLANT DT: 20160301
MDC IDC LEAD LOCATION: 753860
MDC IDC MSMT BATTERY IMPEDANCE: 111 Ohm
MDC IDC MSMT LEADCHNL RA PACING THRESHOLD AMPLITUDE: 0.75 V
MDC IDC MSMT LEADCHNL RA SENSING INTR AMPL: 2.8 mV
MDC IDC MSMT LEADCHNL RV IMPEDANCE VALUE: 674 Ohm
MDC IDC MSMT LEADCHNL RV PACING THRESHOLD AMPLITUDE: 0.625 V
MDC IDC MSMT LEADCHNL RV SENSING INTR AMPL: 11.2 mV
MDC IDC SESS DTM: 20170906164308
MDC IDC SET LEADCHNL RV PACING PULSEWIDTH: 0.4 ms
MDC IDC SET LEADCHNL RV SENSING SENSITIVITY: 5.6 mV
MDC IDC STAT BRADY AP VP PERCENT: 0 %
MDC IDC STAT BRADY AP VS PERCENT: 25 %
MDC IDC STAT BRADY AS VS PERCENT: 75 %

## 2016-01-02 ENCOUNTER — Other Ambulatory Visit (HOSPITAL_COMMUNITY)
Admission: RE | Admit: 2016-01-02 | Discharge: 2016-01-02 | Disposition: A | Discharge status: Home / Self Care | Payer: Managed Care, Other (non HMO) | Source: Ambulatory Visit | Attending: Hematology | Admitting: Hematology

## 2016-01-02 ENCOUNTER — Telehealth: Payer: Self-pay | Admitting: Hematology

## 2016-01-02 ENCOUNTER — Other Ambulatory Visit (HOSPITAL_BASED_OUTPATIENT_CLINIC_OR_DEPARTMENT_OTHER): Payer: Managed Care, Other (non HMO)

## 2016-01-02 ENCOUNTER — Encounter: Payer: Self-pay | Admitting: Hematology

## 2016-01-02 ENCOUNTER — Ambulatory Visit (HOSPITAL_BASED_OUTPATIENT_CLINIC_OR_DEPARTMENT_OTHER): Payer: Managed Care, Other (non HMO) | Admitting: Hematology

## 2016-01-02 VITALS — BP 132/63 | HR 59 | Temp 97.8°F | Resp 18 | Wt 190.2 lb

## 2016-01-02 DIAGNOSIS — D751 Secondary polycythemia: Secondary | ICD-10-CM | POA: Diagnosis not present

## 2016-01-02 DIAGNOSIS — D729 Disorder of white blood cells, unspecified: Secondary | ICD-10-CM

## 2016-01-02 DIAGNOSIS — R918 Other nonspecific abnormal finding of lung field: Secondary | ICD-10-CM

## 2016-01-02 DIAGNOSIS — D72820 Lymphocytosis (symptomatic): Secondary | ICD-10-CM

## 2016-01-02 DIAGNOSIS — D72828 Other elevated white blood cell count: Secondary | ICD-10-CM

## 2016-01-02 DIAGNOSIS — Z85528 Personal history of other malignant neoplasm of kidney: Secondary | ICD-10-CM

## 2016-01-02 DIAGNOSIS — D72821 Monocytosis (symptomatic): Secondary | ICD-10-CM

## 2016-01-02 DIAGNOSIS — D72829 Elevated white blood cell count, unspecified: Secondary | ICD-10-CM

## 2016-01-02 DIAGNOSIS — R740 Nonspecific elevation of levels of transaminase and lactic acid dehydrogenase [LDH]: Secondary | ICD-10-CM

## 2016-01-02 DIAGNOSIS — Z720 Tobacco use: Secondary | ICD-10-CM

## 2016-01-02 DIAGNOSIS — Z905 Acquired absence of kidney: Secondary | ICD-10-CM

## 2016-01-02 DIAGNOSIS — R7402 Elevation of levels of lactic acid dehydrogenase (LDH): Secondary | ICD-10-CM

## 2016-01-02 DIAGNOSIS — G4730 Sleep apnea, unspecified: Secondary | ICD-10-CM | POA: Diagnosis not present

## 2016-01-02 DIAGNOSIS — F418 Other specified anxiety disorders: Secondary | ICD-10-CM

## 2016-01-02 LAB — CBC & DIFF AND RETIC
BASO%: 0.2 % (ref 0.0–2.0)
Basophils Absolute: 0.1 10*3/uL (ref 0.0–0.1)
EOS%: 0.4 % (ref 0.0–7.0)
Eosinophils Absolute: 0.1 10*3/uL (ref 0.0–0.5)
HCT: 50.2 % — ABNORMAL HIGH (ref 38.4–49.9)
HGB: 17.2 g/dL — ABNORMAL HIGH (ref 13.0–17.1)
IMMATURE RETIC FRACT: 14.3 % — AB (ref 3.00–10.60)
LYMPH#: 5.9 10*3/uL — AB (ref 0.9–3.3)
LYMPH%: 21.2 % (ref 14.0–49.0)
MCH: 33.5 pg — ABNORMAL HIGH (ref 27.2–33.4)
MCHC: 34.3 g/dL (ref 32.0–36.0)
MCV: 97.9 fL (ref 79.3–98.0)
MONO#: 2 10*3/uL — AB (ref 0.1–0.9)
MONO%: 7.3 % (ref 0.0–14.0)
NEUT%: 70.9 % (ref 39.0–75.0)
NEUTROS ABS: 19.6 10*3/uL — AB (ref 1.5–6.5)
PLATELETS: 234 10*3/uL (ref 140–400)
RBC: 5.13 10*6/uL (ref 4.20–5.82)
RDW: 14.8 % — ABNORMAL HIGH (ref 11.0–14.6)
RETIC CT ABS: 131.84 10*3/uL — AB (ref 34.80–93.90)
Retic %: 2.57 % — ABNORMAL HIGH (ref 0.80–1.80)
WBC: 27.7 10*3/uL — AB (ref 4.0–10.3)

## 2016-01-02 LAB — COMPREHENSIVE METABOLIC PANEL
ALT: 21 U/L (ref 0–55)
ANION GAP: 9 meq/L (ref 3–11)
AST: 15 U/L (ref 5–34)
Albumin: 3.4 g/dL — ABNORMAL LOW (ref 3.5–5.0)
Alkaline Phosphatase: 93 U/L (ref 40–150)
BILIRUBIN TOTAL: 0.25 mg/dL (ref 0.20–1.20)
BUN: 22.2 mg/dL (ref 7.0–26.0)
CHLORIDE: 109 meq/L (ref 98–109)
CO2: 22 meq/L (ref 22–29)
Calcium: 10.5 mg/dL — ABNORMAL HIGH (ref 8.4–10.4)
Creatinine: 1.5 mg/dL — ABNORMAL HIGH (ref 0.7–1.3)
EGFR: 49 mL/min/{1.73_m2} — AB (ref >90)
GLUCOSE: 175 mg/dL — AB (ref 70–140)
POTASSIUM: 3.9 meq/L (ref 3.5–5.1)
SODIUM: 140 meq/L (ref 136–145)
TOTAL PROTEIN: 6.9 g/dL (ref 6.4–8.3)

## 2016-01-02 LAB — LACTATE DEHYDROGENASE: LDH: 350 U/L — AB (ref 125–245)

## 2016-01-02 NOTE — Telephone Encounter (Signed)
Patient sent back to lab and given avs report and appointments for October. Patient/wife also given November appointments at [facility]. Central radiology will call re bx - patient and wife aware.

## 2016-01-02 NOTE — Patient Instructions (Signed)
-  Labs today -Bone marrow biopsy within a week. -Return to clinic with Dr. Kale about a week after the bone marrow biopsy. -Set up primary care with [facility] -Pulmonary referral given for further optimization of sleep apnea management 

## 2016-01-03 LAB — HAPTOGLOBIN: Haptoglobin: <10 mg/dL — ABNORMAL LOW (ref 34–200)

## 2016-01-03 LAB — KAPPA/LAMBDA LIGHT CHAINS
IG LAMBDA FREE LIGHT CHAIN: 22.9 mg/L (ref 5.7–26.3)
Ig Kappa Free Light Chain: 24.5 mg/L — ABNORMAL HIGH (ref 3.3–19.4)
Kappa/Lambda FluidC Ratio: 1.07 (ref 0.26–1.65)

## 2016-01-03 LAB — HEPATITIS C ANTIBODY

## 2016-01-03 LAB — BETA 2 MICROGLOBULIN, SERUM: Beta-2: 3.2 mg/L — ABNORMAL HIGH (ref 0.6–2.4)

## 2016-01-03 LAB — PTH, INTACT AND CALCIUM
Calcium, Ser: 10.5 mg/dL — ABNORMAL HIGH (ref 8.6–10.2)
PTH, Intact: 34 pg/mL (ref 15–65)

## 2016-01-03 LAB — PROCALCITONIN: PROCALCITONIN: 0.04 ng/mL (ref 0.00–0.08)

## 2016-01-07 LAB — MULTIPLE MYELOMA PANEL, SERUM
ALBUMIN/GLOB SERPL: 1.4 (ref 0.7–1.7)
ALPHA2 GLOB SERPL ELPH-MCNC: 0.5 g/dL (ref 0.4–1.0)
Albumin SerPl Elph-Mcnc: 3.9 g/dL (ref 2.9–4.4)
Alpha 1: 0.2 g/dL (ref 0.0–0.4)
B-Globulin SerPl Elph-Mcnc: 1.2 g/dL (ref 0.7–1.3)
Gamma Glob SerPl Elph-Mcnc: 1.1 g/dL (ref 0.4–1.8)
Globulin, Total: 3 g/dL (ref 2.2–3.9)
IGG (IMMUNOGLOBIN G), SERUM: 921 mg/dL (ref 700–1600)
IGM (IMMUNOGLOBIN M), SRM: 268 mg/dL — AB (ref 20–172)
IgA, Qn, Serum: 210 mg/dL (ref 61–437)
TOTAL PROTEIN: 6.9 g/dL (ref 6.0–8.5)

## 2016-01-07 LAB — FLOW CYTOMETRY

## 2016-01-08 ENCOUNTER — Other Ambulatory Visit: Payer: Self-pay | Admitting: General Surgery

## 2016-01-08 ENCOUNTER — Other Ambulatory Visit: Payer: Self-pay | Admitting: Physician Assistant

## 2016-01-08 NOTE — Progress Notes (Addendum)
Marland Kitchen    HEMATOLOGY/ONCOLOGY CLINIC NOTE  Date of Service: 01/08/2016  Patient Care Team: Raina Mina, MD as PCP - General (Internal Medicine) Dr Marval Regal (nephrology) Dr Ena Dawley -cardiology  Dr Tresa Moore (urology)  CHIEF COMPLAINTS/PURPOSE OF CONSULTATION:  Erythrocytosis and leukocytosis  HISTORY OF PRESENTING ILLNESS:   Kevin English is a wonderful 60 y.o. male who has been referred to [place] by Dr .Gilford Rile, MD and Dr Marval Regal MD for evaluation and management of erythrocytosis and leukocytosis.  Patient has a history of hypertension, diabetes, coronary artery disease status post PCI, chronic kidney disease, we talked block status post permanent pacemaker, rheumatic heart disease with mitral valve replacement(mechanical mitral valve), hypogonadism hypothyroidism, persistent atrial fibrillation, smoker with 4-5 cigars a day, sleep apnea using CPAP (has not had his CPAP settings checked for nearly 10 years or more).  Patient also has a history of renal cell carcinoma diagnosed in June 2015 Patient also reports issues with anxiety and depression.  Patient is being followed up closely by nephrology and had recent labs on 11/08/2015 showed a hemoglobin of 18.1 and a hematocrit of 52.7 with a normal platelet count of 234k and mild leukocytosis of 15.9k with  9.9k neutrophils and 4.5k lymphocytes.  Previous labs in June 2017 showed a hemoglobin of 19.3 with hematocrit of 56.1 and a WBC count of 19.9k with normal platelets. Looking back and labs available in our system his CBC from 06/02/2014 showed a normal hemoglobin of 13.5 with hematocrit of 41.2 and platelet count of 392k with leukocytosis of 15.9 k.  Patient notes he has previously been on testosterone replacement or a few months in 2011/2012 . Notes that his CPAP settings have not been reevaluated for more than 10 years. Smokes about 4-5 cigars a day.  No chest pain or shortness of breath no new headaches or focal neurological  deficits. Patient reports no new fatigue.  He is rather dismissive of his lab findings and is quite resistant to detailed discussions about possibilities of his condition. He feels that his wife is overly concerned about him and he he just wants to be left alone.   INTERVAL HISTORY  Mr Samaras is here for a followup to discuss findings of his workup for polycythemia and leucocytosis. He notes no acute new symptoms. His hemoglobin level has improved without any specific interventions from 19 to 17.2 and hematocrit has dropped from 56.6 down to 50 which is nearly normal.  His WBC counts have gone up some from 16.8k to 27.7k.  No fevers or chills. No focal symptoms suggesting infection. New bone pains no new focal neurological deficits no chest pain no abdominal pain no other focal symptoms suggesting renal cell carcinoma recurrence. No acute weight loss. Patient is anxious and restless and notes he is having difficulty sleeping.   MEDICAL HISTORY:  Past Medical History:  Diagnosis Date  . Anxiety   . Borderline diabetes   . CAD (coronary artery disease)    a. By cath 08/2013: 70% distal cx-prox LPDA; tandem mod LAD lesions, o/w mild dz.  . CKD (chronic kidney disease), stage II    his creat. runs 2-2.5; hasnt seen neph yet - sees dr. Tresa Moore at [facility]  . Complete heart block (HCC)    a. s/p PPM   . Deafness in left ear   . Depression   . Diverticulitis of colon 15 years ago  . Hernia   . HTN (hypertension)   . Hx of echocardiogram    post MVR >>  Echo 4/16:  Mild LVH, EF 45-50%, apical dyskinesis, trivial AI, mechanical MVR functioning appropriately, severe LAE  . Hyperlipidemia   . Hypogonadism male   . Hypothyroidism   . Lung nodule seen on imaging study 04/10/2014   5 mm nodule right lower lobe  . Mitral stenosis    a. Dx 07/2013 - through workup of 2D echo, TEE, and cath, felt to be moderate.  . Obesity   . OSA on CPAP   . Persistent atrial fibrillation ([place])    a. sustained  160-180 on e-CARDIO monitor placed 07/29/2013. Placed on IV amiodarone at end of May 2015 due to continued paroxysms with RVR.  Marland Kitchen Renal cell carcinoma of right kidney ([place]) 09/05/2013   clear cell renal cell carcinoma of right kidney treated by radical nephrectomy, Fuhrman grade 2-3, with maximum tumor diameter 2.7 cm, all tumor to find confined to the kidney, and all surgical margins negative (T1aN0).   . Renal mass    a. Dx 08/2013: concerning for renal cancer.  . Rheumatic heart disease   . S/P Minimally invasive maze operation for atrial fibrillation 05/15/2014   Left side lesion set using cryothermy with clipping of LA appendage  . S/P minimally invasive mitral valve replacement with metallic valve and maze procedure 05/15/2014   59m Sorin [employer] Optiform mechanical valve placed via right mini thoracotomy approach  . Stroke ([facility] 1998   "MRI showed 3 mild strokes"    SURGICAL HISTORY: Past Surgical History:  Procedure Laterality Date  . CARDIAC CATHETERIZATION  08/28/13  . EYE SURGERY Right 1990   "reconstructive, lens implant- from paintball accident"  . INGUINAL HERNIA REPAIR Bilateral first one in 80's   "I had one side done twice"  . LEFT AND RIGHT HEART CATHETERIZATION WITH CORONARY ANGIOGRAM N/A 08/28/2013   Procedure: LEFT AND RIGHT HEART CATHETERIZATION WITH CORONARY ANGIOGRAM;  Surgeon: DLeonie Man MD;  [facility]  Service: Cardiovascular;  Laterality: N/A;  . MINIMALLY INVASIVE MAZE PROCEDURE N/A 05/15/2014   Procedure: MINIMALLY INVASIVE MAZE PROCEDURE;  Surgeon: CRexene Alberts MD;  [place]  Service: Open Heart Surgery;  Laterality: N/A;  . MITRAL VALVE REPLACEMENT Right 05/15/2014   Procedure: MINIMALLY INVASIVE MITRAL VALVE (MV) REPLACEMENT;  Surgeon: CRexene Alberts MD;  [place]  Service: Open Heart Surgery;  Laterality: Right;  . PERMANENT PACEMAKER INSERTION Left 05/22/2014   Procedure: PERMANENT PACEMAKER INSERTION;  Surgeon: JThompson Grayer MD;  [facility];  Service: Cardiovascular;  Laterality: Left;  . ROBOT ASSISTED LAPAROSCOPIC NEPHRECTOMY Right 09/05/2013   Procedure: ROBOTIC ASSISTED LAPAROSCOPIC RIGHT RADICAL NEPHRECTOMY;  Surgeon: DSharyn Creamer MD;  [facility];  Service: Urology;  Laterality: Right;  . TEE WITHOUT CARDIOVERSION N/A 08/16/2013   Procedure: TRANSESOPHAGEAL ECHOCARDIOGRAM (TEE);  Surgeon: KDorothy Spark MD;  [place]  Service: Cardiovascular;  Laterality: N/A;  . TEE WITHOUT CARDIOVERSION N/A 05/15/2014   Procedure: TRANSESOPHAGEAL ECHOCARDIOGRAM (TEE);  Surgeon: CRexene Alberts MD;  [place]  Service: Open Heart Surgery;  Laterality: N/A;    SOCIAL HISTORY: Social History   Social History  . Marital status: Married    Spouse name: N/A  . Number of children: N/A  . Years of education: N/A   Occupational History  . Not on file.   Social History Main Topics  . Smoking status: Former Smoker    Packs/day: 0.25    Years: 40.00    Types: Cigarettes    Quit  date: 05/14/2014  . Smokeless tobacco: Never Used  . Alcohol use No  . Drug use: No  . Sexual activity: Not Currently   Other Topics Concern  . Not on file   Social History Narrative  . No narrative on file    FAMILY HISTORY: Family History  Problem Relation Age of Onset  . Hypertension Mother   . Heart attack Mother   . Stroke Mother   . Hypertension      ALLERGIES:  is allergic to contrast media [iodinated diagnostic agents]; ioxaglate; atorvastatin; crestor [rosuvastatin]; fenofibrate; and pravastatin.  MEDICATIONS:  Current Outpatient Prescriptions  Medication Sig Dispense Refill  . acetaminophen (TYLENOL) 325 MG tablet Take 325 mg by mouth every 6 (six) hours as needed (pain).     Marland Kitchen ALPRAZolam (XANAX) 0.5 MG tablet Take 0.5-1 mg by mouth at bedtime as needed for anxiety.     Marland Kitchen amLODipine (NORVASC) 10 MG tablet Take 1 tablet (10 mg total) by mouth daily. 90 tablet 3  .  aspirin EC 81 MG tablet Take 1 tablet (81 mg total) by mouth daily.    . fluticasone (FLONASE) 50 MCG/ACT nasal spray Place 2 sprays into both nostrils daily.  2  . levothyroxine (SYNTHROID, LEVOTHROID) 75 MCG tablet TAKE 1 TABLET EVERY DAY BEFORE BREAKFAST 90 tablet 1  . loratadine (CLARITIN) 10 MG tablet Take 1 tablet (10 mg total) by mouth daily.    . metoprolol (LOPRESSOR) 50 MG tablet Take 1.5 tablets (75 mg total) by mouth 2 (two) times daily. 270 tablet 3  . omega-3 acid ethyl esters (LOVAZA) 1 g capsule TAKE 2 CAPSULES TWICE A DAY 360 capsule 2  . sertraline (ZOLOFT) 50 MG tablet TAKE 1 TABLET (50 MG TOTAL) BY MOUTH DAILY.    Marland Kitchen warfarin (COUMADIN) 5 MG tablet Take 1-1.5 tablets (5-7.5 mg total) by mouth daily. As directed by Anticoagulation Clinic 40 tablet 3   No current facility-administered medications for this visit.     REVIEW OF SYSTEMS:    10 Point review of Systems was done is negative except as noted above.  PHYSICAL EXAMINATION: ECOG PERFORMANCE STATUS: 2 - Symptomatic, <50% confined to bed  . Vitals:   01/02/16 1007  BP: 132/63  Pulse: (!) 59  Resp: 18  Temp: 97.8 F (36.6 C)   Filed Weights   01/02/16 1007  Weight: 190 lb 3.2 oz (86.3 kg)   .Body mass index is 28.92 kg/m.  GENERAL:alert, in no acute distress and comfortable SKIN: skin color, texture, turgor are normal, no rashes or significant lesions EYES: normal, conjunctiva are pink and non-injected, sclera clear OROPHARYNX:no exudate, no erythema and lips, buccal mucosa, and tongue normal  NECK: supple, no JVD, thyroid normal size, non-tender, without nodularity LYMPH:  no palpable lymphadenopathy in the cervical, axillary or inguinal LUNGS: clear to auscultation with normal respiratory effort HEART: s1s2 irreg, mitral valve click noted ABDOMEN: abdomen soft, non-tender, normoactive bowel sounds , borderline palpable spleen. Musculoskeletal: no cyanosis of digits and no clubbing  PSYCH: alert &  oriented x 3 with fluent speech NEURO: no focal motor/sensory deficits  LABORATORY DATA:  I have reviewed the data as listed  . CBC Latest Ref Rng & Units 01/02/2016 12/03/2015 05/26/2014  WBC 4.0 - 10.3 10e3/uL 27.7(H) 16.8(H) 15.9(H)  Hemoglobin 13.0 - 17.1 g/dL 17.2(H) 19.0(H) 13.5  Hematocrit 38.4 - 49.9 % 50.2(H) 56.6(H) 41.2  Platelets 140 - 400 10e3/uL 234 242 392    . CMP Latest Ref Rng & Units 01/02/2016  01/02/2016 12/03/2015  Glucose 70 - 140 mg/dl 175(H) - 69(L)  BUN 7.0 - 26.0 mg/dL 22.2 - 15.4  Creatinine 0.7 - 1.3 mg/dL 1.5(H) - 1.5(H)  Sodium 136 - 145 mEq/L 140 - 140  Potassium 3.5 - 5.1 mEq/L 3.9 - 4.8  Chloride 98 - 110 mmol/L - - -  CO2 22 - 29 mEq/L 22 - 24  Calcium 8.6 - 10.2 mg/dL 10.5(H) 10.5(H) 10.6(H)  Total Protein 6.0 - 8.5 g/dL 6.9 6.9 7.7  Total Bilirubin 0.20 - 1.20 mg/dL 0.25 - 0.41  Alkaline Phos 40 - 150 U/L 93 - 98  AST 5 - 34 U/L 15 - 21  ALT 0 - 55 U/L 21 - 31    Component     Latest Ref Rng & Units 01/02/2016 01/02/2016 01/02/2016        11:39 AM 11:39 AM 11:39 AM  IgG (Immunoglobin G), Serum     700 - 1,600 mg/dL  921   IgA/Immunoglobulin A, Serum     61 - 437 mg/dL  210   IgM, Qn, Serum     20 - 172 mg/dL  268 (H)   Total Protein     6.0 - 8.5 g/dL  6.9   Albumin SerPl Elph-Mcnc     2.9 - 4.4 g/dL  3.9   Alpha 1     0.0 - 0.4 g/dL  0.2   Alpha2 Glob SerPl Elph-Mcnc     0.4 - 1.0 g/dL  0.5   B-Globulin SerPl Elph-Mcnc     0.7 - 1.3 g/dL  1.2   Gamma Glob SerPl Elph-Mcnc     0.4 - 1.8 g/dL  1.1   M Protein SerPl Elph-Mcnc     Not Observed g/dL  Not Observed   Globulin, Total     2.2 - 3.9 g/dL  3.0   Albumin/Glob SerPl     0.7 - 1.7  1.4   IFE 1       Comment   Please Note (HCV):       Comment   Ig Kappa Free Light Chain     3.3 - 19.4 mg/L  24.5 (H)   Ig Lambda Free Light Chain     5.7 - 26.3 mg/L  22.9   Kappa/Lambda FluidC Ratio     0.26 - 1.65  1.07   Calcium     8.6 - 10.2 mg/dL   10.5 (H)  PTH     15 - 65  pg/mL   34  LDH     125 - 245 U/L 350 (H)    Beta 2     0.6 - 2.4 mg/L  3.2 (H)   Haptoglobin     34 - 200 mg/dL  <10 (L)   Hep C Virus Ab     0.0 - 0.9 s/co ratio  <0.1   Procalcitonin     0.00 - 0.08 ng/mL  0.04    Component     Latest Ref Rng & Units 01/02/2016        11:39 AM  IgG (Immunoglobin G), Serum     700 - 1,600 mg/dL   IgA/Immunoglobulin A, Serum     61 - 437 mg/dL   IgM, Qn, Serum     20 - 172 mg/dL   Total Protein     6.0 - 8.5 g/dL   Albumin SerPl Elph-Mcnc     2.9 - 4.4 g/dL   Alpha 1     0.0 -  0.4 g/dL   Alpha2 Glob SerPl Elph-Mcnc     0.4 - 1.0 g/dL   B-Globulin SerPl Elph-Mcnc     0.7 - 1.3 g/dL   Gamma Glob SerPl Elph-Mcnc     0.4 - 1.8 g/dL   M Protein SerPl Elph-Mcnc     Not Observed g/dL   Globulin, Total     2.2 - 3.9 g/dL   Albumin/Glob SerPl     0.7 - 1.7   IFE 1        Please Note (HCV):        Ig Kappa Free Light Chain     3.3 - 19.4 mg/L   Ig Lambda Free Light Chain     5.7 - 26.3 mg/L   Kappa/Lambda FluidC Ratio     0.26 - 1.65   Calcium     8.6 - 10.2 mg/dL   PTH     15 - 65 pg/mL Comment  LDH     125 - 245 U/L   Beta 2     0.6 - 2.4 mg/L   Haptoglobin     34 - 200 mg/dL   Hep C Virus Ab     0.0 - 0.9 s/co ratio   Procalcitonin     0.00 - 0.08 ng/mL               RADIOGRAPHIC STUDIES: I have personally reviewed the radiological images as listed and agreed with the findings in the report. No results found.  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 Patient has other potential secondary etiologies for polycythemia including active smoking , possibly suboptimally treated sleep apnea. He reports no use of heating or generator to increase chance of carbon monoxide poisoning . Could have some  element of dehydration with hemoconcentration .  Patient polycythemia has nearly resolved since his last visit. He notes he is trying to cut down smoking but is not sure he can do it.  2) Leukocytosis with neutrophilia, mild lymphocytosis and monocytosis. could be reactive (smoking, paraneoplastic) versus rule out myeloproliferative neoplasm . BCR-ABL testing neg for CML. flow cytometry does not show any clonal lymphocyte population procalcitonin WNL --makes bacterial infection unlikely. Hep C neg  3) Elevated LDH -- ?etiology ? Bone marrow turnover.  Noted to have low haptoglobin that might suggest low level hemolysis from his mechanical aortic valve.  4) h/o Renal Cell carcinoma T1aNo s/p left nephrectomy in 08/2013. Was been monitored by Dr Tresa Moore. He has no clinical symptomatology suggestive of disease recurrence at this time.   5) h/o Lung nodules 92m in RLL of lung in 03/2014, CXR on 09/25/2015 shows chronic lung disease with scarring in the bases and negative for metastatic disease.  -Continue monitoring with primary care physician  6) Hypercalcemia - PTH level wnl, SPEP with no M spike IFE showed polyclonal IgM kappa and Lambda gammopathy - ?reactive. PLAN -Patient counseled on maintaining good hydration. -We re-discussed imperative needed smoking cessation absolutely. -Patient needs to be seen by his pulmonologist for repeat sleep study to optimize treatment of his sleep apnea and also to consider PFT's to determine presence of hypoxia from pulmonary and cardiac issues that might need O2 therapy. He notes he [place] given a referral to [facility]. - [place] dopppler renal to r/o RAS -recommended and setup CT guided bone marrow biopsy to r/o MPN or lymphoproliferative disorder. -recommended PET/CT to r/o lymphoma and r/o metastatic renal cell carcinoma given polycythemia (can be seen with RCC), hypercalcemia, elevated LDH.  Patient has refused this currently. -given spontaneous improvement in HCT - no role for therapeutic phlebotomy at this time. -on coumadin per cardiology 7). Patient Active Problem List   Diagnosis Date Noted  . Essential hypertension 03/09/2015  . Chronic diastolic CHF (congestive heart failure), NYHA class 2 ([place]) 03/09/2015  . Coronary artery disease due to lipid rich plaque 03/09/2015  . S/P placement of cardiac pacemaker 06/12/2014  . CHB (complete heart block) ([place]) 05/22/2014  . S/P minimally invasive mitral valve replacement with metallic valve and maze procedure 05/15/2014  . S/P Minimally invasive maze operation for atrial fibrillation 05/15/2014  . Yeast dermatitis 04/19/2014  . Atrial fibrillation ([place])   . Lung nodule seen on imaging study 04/10/2014  . Renal neoplasm 09/05/2013  . Renal cell carcinoma of right kidney-nephrectomy June 2015 09/05/2013  . CAD (coronary artery disease)   . CKD (chronic kidney disease), stage II   . Long term (current) use of anticoagulants 08/21/2013  . Encounter for therapeutic drug monitoring 08/21/2013  . Atrial fibrillation with RVR ([place]) 08/18/2013  . Chronic anticoagulation 08/11/2013  . Paroxysmal atrial fibrillation 08/11/2013  . Hypothyroid 08/11/2013  . Fatigue 08/11/2013  . Dyspnea on exertion 08/11/2013  . Acute on chronic diastolic CHF (congestive heart failure), NYHA class 3 ([place]) 08/09/2013  . Chronic diastolic CHF (congestive heart failure) 08/09/2013  . Rheumatic heart disease 08/02/2013  . Mitral stenosis 08/02/2013  . Other abnormal glucose   . Benign neoplasm of colon   . Hernia   . Chronic rhinitis   . Diverticulitis of colon   . Hyperlipidemia   . HTN (hypertension)   . Hypogonadism male   . Leukocytosis   . Myalgia and myositis, unspecified   . Obesity   . Sleep apnea   . Vitamin D deficiency    -might need more active management of his depression/anxiety -will defer this to PCP -Continue follow-up with  primary care physician for coordination and management of all his medical co-morbidities and complex care co-ordination  Return to clinic with Dr. Irene Limbo in 2 weeks to discuss Bone marrrow bx results.   I counseled the the patient and his questions were answered with apparent satisfaction. The patient knows to call the clinic with any problems, questions or concerns.  I spent 40 minutes counseling the patient face to face. The total time spent in the appointment was 45 minutes and more than 50% was on counseling and direct patient cares.    Sullivan Lone MD MS AAHIVMS [facility] Hematology/Oncology Physician [facility]  (Office):       [phone] (Work cell):  [phone] (Fax):           [phone]  Addendum  We received his CT abdomen results from Dr. Tresa Moore. He had a CT of the abdomen with and without contrast on 01/01/2016. Impression Status post right radical nephrectomy. No findings to suggest local recurrence of disease or definite metastatic disease in the abdomen. Colonic diverticulosis without evidence to suggest acute diverticulitis at this time.

## 2016-01-09 ENCOUNTER — Ambulatory Visit (HOSPITAL_COMMUNITY)
Admission: RE | Admit: 2016-01-09 | Discharge: 2016-01-09 | Disposition: A | Discharge status: Home / Self Care | Payer: Managed Care, Other (non HMO) | Source: Ambulatory Visit | Attending: Hematology | Admitting: Hematology

## 2016-01-09 ENCOUNTER — Encounter (HOSPITAL_COMMUNITY): Payer: Self-pay

## 2016-01-09 DIAGNOSIS — D72820 Lymphocytosis (symptomatic): Secondary | ICD-10-CM | POA: Diagnosis not present

## 2016-01-09 DIAGNOSIS — Z7901 Long term (current) use of anticoagulants: Secondary | ICD-10-CM | POA: Insufficient documentation

## 2016-01-09 DIAGNOSIS — Z7982 Long term (current) use of aspirin: Secondary | ICD-10-CM | POA: Diagnosis not present

## 2016-01-09 DIAGNOSIS — Z87891 Personal history of nicotine dependence: Secondary | ICD-10-CM | POA: Diagnosis not present

## 2016-01-09 DIAGNOSIS — Z905 Acquired absence of kidney: Secondary | ICD-10-CM | POA: Diagnosis not present

## 2016-01-09 DIAGNOSIS — D751 Secondary polycythemia: Secondary | ICD-10-CM | POA: Insufficient documentation

## 2016-01-09 DIAGNOSIS — Z79899 Other long term (current) drug therapy: Secondary | ICD-10-CM | POA: Insufficient documentation

## 2016-01-09 DIAGNOSIS — D72821 Monocytosis (symptomatic): Secondary | ICD-10-CM | POA: Diagnosis not present

## 2016-01-09 DIAGNOSIS — D729 Disorder of white blood cells, unspecified: Secondary | ICD-10-CM

## 2016-01-09 LAB — CBC
HEMATOCRIT: 52.1 % — AB (ref 39.0–52.0)
HEMOGLOBIN: 17.3 g/dL — AB (ref 13.0–17.0)
MCH: 33.1 pg (ref 26.0–34.0)
MCHC: 33.2 g/dL (ref 30.0–36.0)
MCV: 99.6 fL (ref 78.0–100.0)
Platelets: 241 10*3/uL (ref 150–400)
RBC: 5.23 MIL/uL (ref 4.22–5.81)
RDW: 14.8 % (ref 11.5–15.5)
WBC: 16.2 10*3/uL — ABNORMAL HIGH (ref 4.0–10.5)

## 2016-01-09 LAB — APTT: aPTT: 67 seconds — ABNORMAL HIGH (ref 24–36)

## 2016-01-09 LAB — PROTIME-INR
INR: 3.01
Prothrombin Time: 31.9 seconds — ABNORMAL HIGH (ref 11.4–15.2)

## 2016-01-09 LAB — BONE MARROW EXAM

## 2016-01-09 MED ORDER — SODIUM CHLORIDE 0.9 % IV SOLN
INTRAVENOUS | Status: DC
  Administered 2016-01-09: 08:00:00 via INTRAVENOUS

## 2016-01-09 MED ORDER — MIDAZOLAM HCL 2 MG/2ML IJ SOLN
INTRAMUSCULAR | Status: AC | PRN
Start: 2016-01-09 — End: 2016-01-09
  Administered 2016-01-09: 1 mg via INTRAVENOUS
  Administered 2016-01-09: 0.5 mg via INTRAVENOUS
  Administered 2016-01-09: 1 mg via INTRAVENOUS
  Administered 2016-01-09: 0.5 mg via INTRAVENOUS

## 2016-01-09 MED ORDER — FLUMAZENIL 0.5 MG/5ML IV SOLN
INTRAVENOUS | Status: AC
  Filled 2016-01-09: qty 5

## 2016-01-09 MED ORDER — MIDAZOLAM HCL 2 MG/2ML IJ SOLN
INTRAMUSCULAR | Status: AC
  Filled 2016-01-09: qty 6

## 2016-01-09 MED ORDER — FENTANYL CITRATE (PF) 100 MCG/2ML IJ SOLN
INTRAMUSCULAR | Status: AC | PRN
  Administered 2016-01-09: 50 ug via INTRAVENOUS
  Administered 2016-01-09 (×2): 25 ug via INTRAVENOUS
  Administered 2016-01-09: 50 ug via INTRAVENOUS

## 2016-01-09 MED ORDER — NALOXONE HCL 0.4 MG/ML IJ SOLN
INTRAMUSCULAR | Status: DC
Start: 2016-01-09 — End: 2016-01-09
  Filled 2016-01-09: qty 1

## 2016-01-09 MED ORDER — FENTANYL CITRATE (PF) 100 MCG/2ML IJ SOLN
INTRAMUSCULAR | Status: AC
  Filled 2016-01-09: qty 4

## 2016-01-09 NOTE — Consult Note (Signed)
Chief Complaint: "I'm here for a bone marrow biopsy" Referring Physician(s): Brunetta Genera  Supervising Physician: Daryll Brod  Patient Status: [facility] - Out-pt  History of Present Illness: Kevin English is a 60 y.o. male with prior history of renal cell carcinoma 2015 status post left nephrectomy, polycythemia, leukocytosis with neutrophilia/mild lymphocytosis and monocytosis, elevated LDH and hypercalcemia who presents today for CT-guided bone marrow biopsy to rule out myeloproliferative/lymphoproliferative disorder.  Past Medical History:  Diagnosis Date  . Anxiety   . Borderline diabetes   . CAD (coronary artery disease)    a. By cath 08/2013: 70% distal cx-prox LPDA; tandem mod LAD lesions, o/w mild dz.  . CKD (chronic kidney disease), stage II    his creat. runs 2-2.5; hasnt seen neph yet - sees dr. Tresa Moore at [facility]  . Complete heart block (HCC)    a. s/p PPM   . Deafness in left ear   . Depression   . Diverticulitis of colon 15 years ago  . Hernia   . HTN (hypertension)   . Hx of echocardiogram    post MVR >> Echo 4/16:  Mild LVH, EF 45-50%, apical dyskinesis, trivial AI, mechanical MVR functioning appropriately, severe LAE  . Hyperlipidemia   . Hypogonadism male   . Hypothyroidism   . Lung nodule seen on imaging study 04/10/2014   5 mm nodule right lower lobe  . Mitral stenosis    a. Dx 07/2013 - through workup of 2D echo, TEE, and cath, felt to be moderate.  . Obesity   . OSA on CPAP   . Persistent atrial fibrillation ([place])    a. sustained 160-180 on e-CARDIO monitor placed 07/29/2013. Placed on IV amiodarone at end of May 2015 due to continued paroxysms with RVR.  Marland Kitchen Renal cell carcinoma of right kidney ([place]) 09/05/2013   clear cell renal cell carcinoma of right kidney treated by radical nephrectomy, Fuhrman grade 2-3, with maximum tumor diameter 2.7 cm, all tumor to find confined to the kidney, and all surgical margins negative (T1aN0).   . Renal mass    a.  Dx 08/2013: concerning for renal cancer.  . Rheumatic heart disease   . S/P Minimally invasive maze operation for atrial fibrillation 05/15/2014   Left side lesion set using cryothermy with clipping of LA appendage  . S/P minimally invasive mitral valve replacement with metallic valve and maze procedure 05/15/2014   67m Sorin [employer] Optiform mechanical valve placed via right mini thoracotomy approach  . Stroke ([facility] 1998   "MRI showed 3 mild strokes"    Past Surgical History:  Procedure Laterality Date  . CARDIAC CATHETERIZATION  08/28/13  . EYE SURGERY Right 1990   "reconstructive, lens implant- from paintball accident"  . INGUINAL HERNIA REPAIR Bilateral first one in 80's   "I had one side done twice"  . LEFT AND RIGHT HEART CATHETERIZATION WITH CORONARY ANGIOGRAM N/A 08/28/2013   Procedure: LEFT AND RIGHT HEART CATHETERIZATION WITH CORONARY ANGIOGRAM;  Surgeon: DLeonie Man MD;  [facility];  Service: Cardiovascular;  Laterality: N/A;  . MINIMALLY INVASIVE MAZE PROCEDURE N/A 05/15/2014   Procedure: MINIMALLY INVASIVE MAZE PROCEDURE;  Surgeon: CRexene Alberts MD;  [place]  Service: Open Heart Surgery;  Laterality: N/A;  . MITRAL VALVE REPLACEMENT Right 05/15/2014   Procedure: MINIMALLY INVASIVE MITRAL VALVE (MV) REPLACEMENT;  Surgeon: CRexene Alberts MD;  [place]  Service: Open Heart Surgery;  Laterality: Right;  . PERMANENT PACEMAKER INSERTION Left 05/22/2014  Procedure: PERMANENT PACEMAKER INSERTION;  Surgeon: Thompson Grayer, MD;  Location: [facility] CATH LAB;  Service: Cardiovascular;  Laterality: Left;  . ROBOT ASSISTED LAPAROSCOPIC NEPHRECTOMY Right 09/05/2013   Procedure: ROBOTIC ASSISTED LAPAROSCOPIC RIGHT RADICAL NEPHRECTOMY;  Surgeon: Sharyn Creamer, MD;  [facility];  Service: Urology;  Laterality: Right;  . TEE WITHOUT CARDIOVERSION N/A 08/16/2013   Procedure: TRANSESOPHAGEAL ECHOCARDIOGRAM (TEE);  Surgeon: Dorothy Spark, MD;  Location: [place];  Service: Cardiovascular;  Laterality: N/A;  . TEE WITHOUT CARDIOVERSION N/A 05/15/2014   Procedure: TRANSESOPHAGEAL ECHOCARDIOGRAM (TEE);  Surgeon: Rexene Alberts, MD;  Location: [place];  Service: Open Heart Surgery;  Laterality: N/A;    Allergies: Contrast media [iodinated diagnostic agents]; Ioxaglate; Atorvastatin; Crestor [rosuvastatin]; Fenofibrate; and Pravastatin  Medications: Prior to Admission medications   Medication Sig Start Date End Date Taking? Authorizing Provider  acetaminophen (TYLENOL) 325 MG tablet Take 325 mg by mouth every 6 (six) hours as needed (pain).    Yes Historical Provider, MD  ALPRAZolam Duanne Moron) 0.5 MG tablet Take 0.5-1 mg by mouth at bedtime as needed for anxiety.    Yes Historical Provider, MD  amLODipine (NORVASC) 10 MG tablet Take 1 tablet (10 mg total) by mouth daily. 07/22/15  Yes Liliane Shi, PA-C  aspirin EC 81 MG tablet Take 1 tablet (81 mg total) by mouth daily. 05/26/14  Yes Erin R Barrett, PA-C  fluticasone (FLONASE) 50 MCG/ACT nasal spray Place 2 sprays into both nostrils daily. 09/10/15  Yes Dorothy Spark, MD  levothyroxine (SYNTHROID, LEVOTHROID) 75 MCG tablet TAKE 1 TABLET EVERY DAY BEFORE BREAKFAST 09/18/15  Yes Dorothy Spark, MD  loratadine (CLARITIN) 10 MG tablet Take 1 tablet (10 mg total) by mouth daily. 09/10/15  Yes Dorothy Spark, MD  metoprolol (LOPRESSOR) 50 MG tablet Take 1.5 tablets (75 mg total) by mouth 2 (two) times daily. 08/07/15  Yes Liliane Shi, PA-C  omega-3 acid ethyl esters (LOVAZA) 1 g capsule TAKE 2 CAPSULES TWICE A DAY 12/02/15  Yes Dorothy Spark, MD  sertraline (ZOLOFT) 50 MG tablet TAKE 1 TABLET (50 MG TOTAL) BY MOUTH DAILY. 11/26/15  Yes Historical Provider, MD  warfarin (COUMADIN) 5 MG tablet Take 1-1.5 tablets (5-7.5 mg total) by mouth daily. As directed by Anticoagulation Clinic 09/04/15  Yes Dorothy Spark, MD     Family History  Problem Relation Age of Onset  . Hypertension Mother   .  Heart attack Mother   . Stroke Mother   . Hypertension      Social History   Social History  . Marital status: Married    Spouse name: N/A  . Number of children: N/A  . Years of education: N/A   Social History Main Topics  . Smoking status: Former Smoker    Packs/day: 0.25    Years: 40.00    Types: Cigarettes    Quit date: 05/14/2014  . Smokeless tobacco: Never Used  . Alcohol use No  . Drug use: No  . Sexual activity: Not Currently   Other Topics Concern  . None   Social History Narrative  . None      Review of Systems currently denies fever, headache, chest pain, dyspnea, cough, abdominal/back pain, nausea, vomiting or abnormal bleeding.  He does have  fatigue  Vital Signs: BP 139/76 (BP Location: Right Arm)   Pulse (!) 59   Temp 98.3 F (36.8 C) (Oral)   Resp 18   SpO2 100%   Physical Exam  Awake, alert. Chest clear to auscultation bilaterally. Heart with regular rate and rhythm, positive click; abdomen soft, positive bowel sounds, nontender. Lower extremities with no edema.  Mallampati Score:     Imaging: No results found.  Labs:  CBC:  Recent Labs  12/03/15 1330 01/02/16 0951 01/09/16 0735  WBC 16.8* 27.7* 16.2*  HGB 19.0* 17.2* 17.3*  HCT 56.6* 50.2* 52.1*  PLT 242 234 241    COAGS:  Recent Labs  09/10/15 1011 10/22/15 1201 12/03/15 1330 01/09/16 0735  INR 2.6 3.3 3.50 3.01  APTT  --   --   --  67*    BMP:  Recent Labs  03/19/15 0756 12/03/15 1330 01/02/16 0951 01/02/16 1139  NA 137 140 140  --   K 4.4 4.8 3.9  --   CL 105  --   --   --   CO2 _0 --   GLUCOSE 106* 69* 175*  --   BUN 16 15.4 22.2  --   CALCIUM 9.9 10.6* 10.5* 10.5*  CREATININE 1.63* 1.5* 1.5*  --     LIVER FUNCTION TESTS:  Recent Labs  03/19/15 0756 12/03/15 1330 01/02/16 0951 01/02/16 1139  BILITOT 0.4 0.41 0.25  --   AST _1 --   ALT _2 --   ALKPHOS 84 98 93  --   PROT 7.1 7.7 6.9 6.9  ALBUMIN 3.8 3.8 3.4*  --      TUMOR MARKERS: No results for input(s): AFPTM, CEA, CA199, CHROMGRNA in the last 8760 hours.  Assessment and Plan: 60 y.o. male with prior history of renal cell carcinoma 2015 status post left nephrectomy, polycythemia, leukocytosis with neutrophilia/mild lymphocytosis and monocytosis, elevated LDH and hypercalcemia who presents today for CT-guided bone marrow biopsy to rule out myeloproliferative/lymphoproliferative disorder.Risks and Benefits discussed with the patient/wife including, but not limited to bleeding, infection, damage to adjacent structures or low yield requiring additional tests.All of the patient's questions were answered, patient is agreeable to proceed.Consent signed and in chart.     Thank you for this interesting consult.  I greatly enjoyed meeting [employer] and look forward to participating in their care.  A copy of this report was sent to the requesting provider on this date.  Electronically Signed: D. Rowe Robert 01/09/2016, 8:54 AM   I spent a total of 20 minutes in face to face in clinical consultation, greater than 50% of which was counseling/coordinating care for CT guided bone marrow biopsy

## 2016-01-09 NOTE — Discharge Instructions (Signed)
Moderate Conscious Sedation, Adult, Care After °Refer to this sheet in the next few weeks. These instructions provide you with information on caring for yourself after your procedure. Your health care provider may also give you more specific instructions. Your treatment has been planned according to current medical practices, but problems sometimes occur. Call your health care provider if you have any problems or questions after your procedure. °WHAT TO EXPECT AFTER THE PROCEDURE  °After your procedure: °· You may feel sleepy, clumsy, and have poor balance for several hours. °· Vomiting may occur if you eat too soon after the procedure. °HOME CARE INSTRUCTIONS °· Do not participate in any activities where you could become injured for at least 24 hours. Do not: °¨ Drive. °¨ Swim. °¨ Ride a bicycle. °¨ Operate heavy machinery. °¨ Cook. °¨ Use power tools. °¨ Climb ladders. °¨ Work from a high place. °· Do not make important decisions or sign legal documents until you are improved. °· If you vomit, drink water, juice, or soup when you can drink without vomiting. Make sure you have little or no nausea before eating solid foods. °· Only take over-the-counter or prescription medicines for pain, discomfort, or fever as directed by your health care provider. °· Make sure you and your family fully understand everything about the medicines given to you, including what side effects may occur. °· You should not drink alcohol, take sleeping pills, or take medicines that cause drowsiness for at least 24 hours. °· If you smoke, do not smoke without supervision. °· If you are feeling better, you may resume normal activities 24 hours after you were sedated. °· Keep all appointments with your health care provider. °SEEK MEDICAL CARE IF: °· Your skin is pale or bluish in color. °· You continue to feel nauseous or vomit. °· Your pain is getting worse and is not helped by medicine. °· You have bleeding or swelling. °· You are still  sleepy or feeling clumsy after 24 hours. °SEEK IMMEDIATE MEDICAL CARE IF: °· You develop a rash. °· You have difficulty breathing. °· You develop any type of allergic problem. °· You have a fever. °MAKE SURE YOU: °· Understand these instructions. °· Will watch your condition. °· Will get help right away if you are not doing well or get worse. °  °This information is not intended to replace advice given to you by your health care provider. Make sure you discuss any questions you have with your health care provider. °  °Document Released: 12/28/2012 Document Revised: 03/30/2014 Document Reviewed: 12/28/2012 °Elsevier Interactive Patient Education ©2016 [employer] °Bone Marrow Aspiration and Bone Marrow Biopsy, Care After °Refer to this sheet in the next few weeks. These instructions provide you with information about caring for yourself after your procedure. Your health care provider may also give you more specific instructions. Your treatment has been planned according to current medical practices, but problems sometimes occur. Call your health care provider if you have any problems or questions after your procedure. °WHAT TO EXPECT AFTER THE PROCEDURE °After your procedure, it is common to have: °· Soreness or tenderness around the puncture site. °· Bruising. °HOME CARE INSTRUCTIONS °· Take medicines only as directed by your health care provider. °· Follow your health care provider's instructions about: °¨ Puncture site care. °¨ Bandage (dressing) changes and removal. °· Bathe and shower as directed by your health care provider. °· Check your puncture site every day for signs of infection. Watch for: °¨ Redness, swelling, or pain. °¨   Fluid, blood, or pus. °· Return to your normal activities as directed by your health care provider. °· Keep all follow-up visits as directed by your health care provider. This is important. °SEEK MEDICAL CARE IF: °· You have a fever. °· You have uncontrollable bleeding. °· You have  redness, swelling, or pain at the site of your puncture. °· You have fluid, blood, or pus coming from your puncture site. °  °This information is not intended to replace advice given to you by your health care provider. Make sure you discuss any questions you have with your health care provider. °  °Document Released: 09/26/2004 Document Revised: 07/24/2014 Document Reviewed: 02/28/2014 °Elsevier Interactive Patient Education ©2016 [employer] ° °

## 2016-01-09 NOTE — Procedures (Signed)
S/p RT ILIAC BM ASP AND CORE BX No comp Stable Full report in PACS PATH pending

## 2016-01-14 ENCOUNTER — Telehealth: Payer: Self-pay | Admitting: Hematology

## 2016-01-14 ENCOUNTER — Ambulatory Visit (INDEPENDENT_AMBULATORY_CARE_PROVIDER_SITE_OTHER): Payer: Managed Care, Other (non HMO) | Admitting: *Deleted

## 2016-01-14 DIAGNOSIS — Z954 Presence of other heart-valve replacement: Secondary | ICD-10-CM | POA: Diagnosis not present

## 2016-01-14 DIAGNOSIS — Z5181 Encounter for therapeutic drug level monitoring: Secondary | ICD-10-CM

## 2016-01-14 DIAGNOSIS — I4891 Unspecified atrial fibrillation: Secondary | ICD-10-CM | POA: Diagnosis not present

## 2016-01-14 DIAGNOSIS — I5033 Acute on chronic diastolic (congestive) heart failure: Secondary | ICD-10-CM

## 2016-01-14 LAB — POCT INR: INR: 3.7

## 2016-01-14 NOTE — Telephone Encounter (Signed)
PAL - Moved 10/26 lab/fu from 01/16/16 to 01/21/16. spoke with patient and wife they are aware.   Per wife request left message for desk nurse asking if patient could get bmbx results before 01/21/16.

## 2016-01-16 ENCOUNTER — Other Ambulatory Visit: Payer: Managed Care, Other (non HMO)

## 2016-01-16 ENCOUNTER — Ambulatory Visit: Payer: Managed Care, Other (non HMO) | Admitting: Hematology

## 2016-01-19 ENCOUNTER — Other Ambulatory Visit: Payer: Self-pay | Admitting: Cardiology

## 2016-01-20 LAB — CHROMOSOME ANALYSIS, BONE MARROW

## 2016-01-21 ENCOUNTER — Other Ambulatory Visit: Payer: Managed Care, Other (non HMO)

## 2016-01-21 ENCOUNTER — Encounter: Payer: Self-pay | Admitting: Hematology

## 2016-01-21 ENCOUNTER — Ambulatory Visit (HOSPITAL_BASED_OUTPATIENT_CLINIC_OR_DEPARTMENT_OTHER): Payer: Managed Care, Other (non HMO) | Admitting: Hematology

## 2016-01-21 VITALS — BP 118/64 | HR 60 | Temp 98.1°F | Resp 18 | Ht 68.0 in | Wt 194.5 lb

## 2016-01-21 DIAGNOSIS — R7402 Elevation of levels of lactic acid dehydrogenase (LDH): Secondary | ICD-10-CM

## 2016-01-21 DIAGNOSIS — D751 Secondary polycythemia: Secondary | ICD-10-CM | POA: Diagnosis not present

## 2016-01-21 DIAGNOSIS — D729 Disorder of white blood cells, unspecified: Secondary | ICD-10-CM

## 2016-01-21 DIAGNOSIS — R740 Nonspecific elevation of levels of transaminase and lactic acid dehydrogenase [LDH]: Secondary | ICD-10-CM | POA: Diagnosis not present

## 2016-01-27 ENCOUNTER — Encounter: Payer: Self-pay | Admitting: Family

## 2016-01-27 ENCOUNTER — Ambulatory Visit (INDEPENDENT_AMBULATORY_CARE_PROVIDER_SITE_OTHER): Payer: PPO | Admitting: Family

## 2016-01-27 ENCOUNTER — Other Ambulatory Visit (INDEPENDENT_AMBULATORY_CARE_PROVIDER_SITE_OTHER): Payer: PPO

## 2016-01-27 VITALS — BP 132/82 | HR 60 | Temp 98.2°F | Resp 16 | Ht 68.0 in | Wt 192.0 lb

## 2016-01-27 DIAGNOSIS — N182 Chronic kidney disease, stage 2 (mild): Secondary | ICD-10-CM

## 2016-01-27 DIAGNOSIS — E782 Mixed hyperlipidemia: Secondary | ICD-10-CM

## 2016-01-27 DIAGNOSIS — I5032 Chronic diastolic (congestive) heart failure: Secondary | ICD-10-CM

## 2016-01-27 DIAGNOSIS — E038 Other specified hypothyroidism: Secondary | ICD-10-CM

## 2016-01-27 DIAGNOSIS — F411 Generalized anxiety disorder: Secondary | ICD-10-CM | POA: Diagnosis not present

## 2016-01-27 LAB — COMPREHENSIVE METABOLIC PANEL
ALT: 24 U/L (ref 0–53)
AST: 18 U/L (ref 0–37)
Albumin: 4.2 g/dL (ref 3.5–5.2)
Alkaline Phosphatase: 86 U/L (ref 39–117)
BUN: 17 mg/dL (ref 6–23)
CALCIUM: 10.2 mg/dL (ref 8.4–10.5)
CHLORIDE: 107 meq/L (ref 96–112)
CO2: 28 meq/L (ref 19–32)
Creatinine, Ser: 1.5 mg/dL (ref 0.40–1.50)
GFR: 50.29 mL/min — AB (ref >60.00)
GLUCOSE: 111 mg/dL — AB (ref 70–99)
POTASSIUM: 4.9 meq/L (ref 3.5–5.1)
Sodium: 139 mEq/L (ref 135–145)
Total Bilirubin: 0.4 mg/dL (ref 0.2–1.2)
Total Protein: 7 g/dL (ref 6.0–8.3)

## 2016-01-27 LAB — TSH: TSH: 0.91 u[IU]/mL (ref 0.35–4.50)

## 2016-01-27 MED ORDER — SERTRALINE HCL 50 MG PO TABS: ORAL_TABLET | ORAL | 0 refills | Status: DC

## 2016-01-27 MED ORDER — ALPRAZOLAM 0.5 MG PO TABS: 0.5000 mg | ORAL_TABLET | Freq: Every evening | ORAL | 2 refills | Status: DC | PRN

## 2016-01-27 NOTE — Progress Notes (Signed)
Subjective:    Patient ID: Kevin English, male    DOB: October 22, 1955, 60 y.o.   MRN: [id number]  Chief Complaint  Patient presents with  . Establish Care    has one kidney thats not fully functioning, trying to get into the [facility] so everything is under one umbrella     HPI:  Kevin English is a 60 y.o. male who  has a past medical history of Anxiety; Borderline diabetes; CAD (coronary artery disease); CKD (chronic kidney disease), stage II; Complete heart block ([place]); Deafness in left ear; Depression; Diverticulitis of colon (15 years ago); Hernia; HTN (hypertension); echocardiogram; Hyperlipidemia; Hypogonadism male; Hypothyroidism; Lung nodule seen on imaging study (04/10/2014); Mitral stenosis; Obesity; OSA on CPAP; Persistent atrial fibrillation ([place]); Renal cell carcinoma of right kidney ([place]) (09/05/2013); Renal mass; Rheumatic heart disease; S/P Minimally invasive maze operation for atrial fibrillation (05/15/2014); S/P minimally invasive mitral valve replacement with metallic valve and maze procedure (05/15/2014); and Stroke ([place]) (1998). and presents today for an office visit to establish care.   1.) Hypertension - Currently maintained on Amlodipine and metoprolol. Reports taking the medication as prescribed and denies adverse side effects or hypotensive readings. Blood pressures at home are below goal of 140/90. Denies worse headache of life or new symptoms of end organ damage.   BP Readings from Last 3 Encounters:  01/27/16 132/82  01/21/16 118/64  01/09/16 103/69    2.) Hypothyroidism - Currently maintained on levothyroxine. Reports taking the medication as prescribed and denies adverse side effects. Notes medication adequate control since symptoms. Denies changes to skin/hair/nails or temperature intolerance. Does have occasional fatigue.  Lab Results  Component Value Date   TSH 0.91 01/27/2016    3.) Anxiety / Sleep - Currently maintained on the Zoloft and alprazolam.  Reports taking the medications as prescribed. Reports taking about 1 mg in the evenings to help and will occasionally need for anxiety during the day. Believes that his symptoms are generally well controlled with the medication.   4.) Sleep apnea - Currently maintained on CPAP. Reports using the device as prescribed and it currently well controls his symptoms. Currently   Allergies  Allergen Reactions  . Contrast Media [Iodinated Diagnostic Agents] Hives and Other (See Comments)    Patient started sneezing and coughing, patient broke out in hives, patient needs 13 hour prep if having IV contrast  . Ioxaglate Hives and Rash    Patient started sneezing and coughing, patient broke out in hives, patient needs 13 hour prep if having IV contrast  . Atorvastatin Other (See Comments)    Myalgias  . Crestor [Rosuvastatin] Other (See Comments)    Myalgias  . Fenofibrate Other (See Comments)    MYALGIAS   . Pravastatin Other (See Comments)    Myalgias       Outpatient Medications Prior to Visit  Medication Sig Dispense Refill  . acetaminophen (TYLENOL) 325 MG tablet Take 325 mg by mouth every 6 (six) hours as needed (pain).     Marland Kitchen amLODipine (NORVASC) 10 MG tablet Take 1 tablet (10 mg total) by mouth daily. 90 tablet 3  . aspirin EC 81 MG tablet Take 1 tablet (81 mg total) by mouth daily.    . fluticasone (FLONASE) 50 MCG/ACT nasal spray Place 2 sprays into both nostrils daily.  2  . levothyroxine (SYNTHROID, LEVOTHROID) 75 MCG tablet TAKE 1 TABLET EVERY DAY BEFORE BREAKFAST 90 tablet 1  . loratadine (CLARITIN) 10 MG tablet Take 1 tablet (10  mg total) by mouth daily.    . metoprolol (LOPRESSOR) 50 MG tablet Take 1.5 tablets (75 mg total) by mouth 2 (two) times daily. 270 tablet 3  . omega-3 acid ethyl esters (LOVAZA) 1 g capsule TAKE 2 CAPSULES TWICE A DAY 360 capsule 2  . warfarin (COUMADIN) 5 MG tablet Take As directed by Anticoagulation Clinic 40 tablet 3  . ALPRAZolam (XANAX) 0.5 MG  tablet Take 0.5-1 mg by mouth at bedtime as needed for anxiety.     . sertraline (ZOLOFT) 50 MG tablet TAKE 1 TABLET (50 MG TOTAL) BY MOUTH DAILY.     No facility-administered medications prior to visit.      Past Medical History:  Diagnosis Date  . Anxiety   . Borderline diabetes   . CAD (coronary artery disease)    a. By cath 08/2013: 70% distal cx-prox LPDA; tandem mod LAD lesions, o/w mild dz.  . CKD (chronic kidney disease), stage II    his creat. runs 2-2.5; hasnt seen neph yet - sees dr. Tresa Moore at [facility]  . Complete heart block (HCC)    a. s/p PPM   . Deafness in left ear   . Depression   . Diverticulitis of colon 15 years ago  . Hernia   . HTN (hypertension)   . Hx of echocardiogram    post MVR >> Echo 4/16:  Mild LVH, EF 45-50%, apical dyskinesis, trivial AI, mechanical MVR functioning appropriately, severe LAE  . Hyperlipidemia   . Hypogonadism male   . Hypothyroidism   . Lung nodule seen on imaging study 04/10/2014   5 mm nodule right lower lobe  . Mitral stenosis    a. Dx 07/2013 - through workup of 2D echo, TEE, and cath, felt to be moderate.  . Obesity   . OSA on CPAP   . Persistent atrial fibrillation ([place])    a. sustained 160-180 on e-CARDIO monitor placed 07/29/2013. Placed on IV amiodarone at end of May 2015 due to continued paroxysms with RVR.  Marland Kitchen Renal cell carcinoma of right kidney ([place]) 09/05/2013   clear cell renal cell carcinoma of right kidney treated by radical nephrectomy, Fuhrman grade 2-3, with maximum tumor diameter 2.7 cm, all tumor to find confined to the kidney, and all surgical margins negative (T1aN0).   . Renal mass    a. Dx 08/2013: concerning for renal cancer.  . Rheumatic heart disease   . S/P Minimally invasive maze operation for atrial fibrillation 05/15/2014   Left side lesion set using cryothermy with clipping of LA appendage  . S/P minimally invasive mitral valve replacement with metallic valve and maze procedure 05/15/2014   60mm Sorin  [employer] Optiform mechanical valve placed via right mini thoracotomy approach  . Stroke [facility]) 1998   "MRI showed 3 mild strokes"      Past Surgical History:  Procedure Laterality Date  . CARDIAC CATHETERIZATION  08/28/13  . EYE SURGERY Right 1990   "reconstructive, lens implant- from paintball accident"  . INGUINAL HERNIA REPAIR Bilateral first one in 80's   "I had one side done twice"  . LEFT AND RIGHT HEART CATHETERIZATION WITH CORONARY ANGIOGRAM N/A 08/28/2013   Procedure: LEFT AND RIGHT HEART CATHETERIZATION WITH CORONARY ANGIOGRAM;  Surgeon: Leonie Man, MD;  Location[facility] C[facility] LAB;  Service: Cardiovascular;  Laterality: N/A;  . MINIMALLY INVASIVE MAZE PROCEDURE N/A 05/15/2014   Procedure: MINIMALLY INVASIVE MAZE PROCEDURE;  Surgeon: Rexene Alberts, MD;  Location: [place];  Service: Open Heart Surgery;  Laterality: N/A;  . MITRAL VALVE REPLACEMENT Right 05/15/2014   Procedure: MINIMALLY INVASIVE MITRAL VALVE (MV) REPLACEMENT;  Surgeon: Rexene Alberts, MD;  Location: [place];  Service: Open Heart Surgery;  Laterality: Right;  . PERMANENT PACEMAKER INSERTION Left 05/22/2014   Procedure: PERMANENT PACEMAKER INSERTION;  Surgeon: Thompson Grayer, MD;  Location: [facility] CATH LAB;  Service: Cardiovascular;  Laterality: Left;  . ROBOT ASSISTED LAPAROSCOPIC NEPHRECTOMY Right 09/05/2013   Procedure: ROBOTIC ASSISTED LAPAROSCOPIC RIGHT RADICAL NEPHRECTOMY;  Surgeon: Sharyn Creamer, MD;  Location: [facility];  Service: Urology;  Laterality: Right;  . TEE WITHOUT CARDIOVERSION N/A 08/16/2013   Procedure: TRANSESOPHAGEAL ECHOCARDIOGRAM (TEE);  Surgeon: Dorothy Spark, MD;  Location: [place];  Service: Cardiovascular;  Laterality: N/A;  . TEE WITHOUT CARDIOVERSION N/A 05/15/2014   Procedure: TRANSESOPHAGEAL ECHOCARDIOGRAM (TEE);  Surgeon: Rexene Alberts, MD;  Location: [place];  Service: Open Heart Surgery;  Laterality: N/A;      Family History  Problem Relation Age of Onset  . Hypertension Mother     . Heart attack Mother   . Stroke Mother   . Hypertension        Social History   Social History  . Marital status: Married    Spouse name: N/A  . Number of children: 2  . Years of education: 16   Occupational History  . Retired     Social History Main Topics  . Smoking status: Current Some Day Smoker    Packs/day: 0.25    Years: 40.00    Types: Cigarettes    Last attempt to quit: 05/14/2014  . Smokeless tobacco: Never Used     Comment: Cigar every now and then  . Alcohol use No  . Drug use: No  . Sexual activity: Not Currently   Other Topics Concern  . Not on file   Social History Narrative   Fun: [employer] people.       Review of Systems  Constitutional: Negative for chills and fever.  Eyes:       Negative for changes in vision  Respiratory: Negative for cough, chest tightness and wheezing.   Cardiovascular: Negative for chest pain, palpitations and leg swelling.  Endocrine: Negative for cold intolerance and heat intolerance.  Musculoskeletal: Negative for arthralgias.  Neurological: Negative for dizziness, weakness, light-headedness and headaches.       Objective:    BP 132/82 (BP Location: Left Arm, Patient Position: Sitting, Cuff Size: Normal)   Pulse 60   Temp 98.2 F (36.8 C) (Oral)   Resp 16   Ht 5\' 8"  (1.727 m)   Wt 192 lb (87.1 kg)   SpO2 96%   BMI 29.19 kg/m  Nursing note and vital signs reviewed.  Physical Exam  Constitutional: He is oriented to person, place, and time. He appears well-developed and well-nourished. No distress.  Cardiovascular: Normal rate, regular rhythm, normal heart sounds and intact distal pulses.   Pulmonary/Chest: Effort normal and breath sounds normal.  Neurological: He is alert and oriented to person, place, and time.  Skin: Skin is warm and dry.  Psychiatric: He has a normal mood and affect. His behavior is normal. Judgment and thought content normal.        Assessment & Plan:   Problem List Items  Addressed This Visit      Cardiovascular and Mediastinum   Chronic diastolic CHF (congestive heart failure), NYHA class 2 (HCC) - Primary    Heart failure appears stable and managed through risk factors including hypertension  which appear adequately controlled. Physical exam appears euvolemic with no shortness of breath or leg swelling. Maintained on metoprolol for hypertension and sudden cardiac death. Continue to monitor with changes per cardiology.      Relevant Orders   Comprehensive metabolic panel (Completed)     Endocrine   Hypothyroid (Chronic)    Currently maintained on levothyroxine with no adverse side effects. Obtain TSH. Continue current dosage of levothyroxine pending TSH results.      Relevant Orders   TSH (Completed)     Genitourinary   CKD (chronic kidney disease), stage II (Chronic)    Previously noted to have CK D stage II with 1 remaining kidney. Obtain complete metabolic profile for therapeutic drug monitoring and to check current kidney function. Consider addition of Ace/ARB for kidney protection.        Other   Hyperlipidemia (Chronic)    Hyperlipidemia appears stable and currently maintained on omega-3 fatty acids and managed by the lipid clinic. Continue current dosage of omega-3 fatty acids with changes and follow-up per lipid clinic.      Anxiety state    Anxiety currently managed with alprazolam and sertraline appears stable with no adverse side effects. Discussed importance of stress and stress management techniques. Continue current dosage of sertraline and alprazolam. Continue to monitor.      Relevant Medications   ALPRAZolam (XANAX) 0.5 MG tablet   sertraline (ZOLOFT) 50 MG tablet       I have changed Mr. Barstow ALPRAZolam. I am also having him maintain his acetaminophen, aspirin EC, amLODipine, metoprolol, fluticasone, loratadine, levothyroxine, omega-3 acid ethyl esters, warfarin, and sertraline.   Meds ordered this encounter    Medications  . ALPRAZolam (XANAX) 0.5 MG tablet    Sig: Take 1-2 tablets (0.5-1 mg total) by mouth at bedtime as needed for anxiety or sleep.    Dispense:  90 tablet    Refill:  2    Order Specific Question:   Supervising Provider    Answer:   Pricilla Holm A [id number]  . sertraline (ZOLOFT) 50 MG tablet    Sig: TAKE 1 TABLET (50 MG TOTAL) BY MOUTH DAILY.    Dispense:  90 tablet    Refill:  0    Order Specific Question:   Supervising Provider    Answer:   Pricilla Holm A [id number]     Follow-up: Return in about 3 months (around 04/28/2016), or if symptoms worsen or fail to improve.  Mauricio Po, FNP

## 2016-01-27 NOTE — Patient Instructions (Addendum)
Thank you for choosing [facility].  SUMMARY AND INSTRUCTIONS:  Please follow up with Dr. Halford Chessman of Pulmonology for your sleep apnea.   Continue to follow up with Dr. [name] for coumadin and cholesterol.   Medication:  Please continue to take your medications as prescribed.  Your prescription(s) have been submitted to your pharmacy or been printed and provided for you. Please take as directed and contact our office if you believe you are having problem(s) with the medication(s) or have any questions.  Labs:  Please stop by the lab on the lower level of the building for your blood work. Your results will be released to [place] (or called to you) after review, usually within 72 hours after test completion. If any changes need to be made, you will be notified at that same time.  1.) The lab is open from 7:30am to 5:30 pm Monday-Friday 2.) No appointment is necessary 3.) Fasting (if needed) is 6-8 hours after food and drink; black coffee and water are okay   Follow up:  If your symptoms worsen or fail to improve, please contact our office for further instruction, or in case of emergency go directly to the emergency room at the closest medical facility.

## 2016-01-28 ENCOUNTER — Encounter: Payer: Self-pay | Admitting: Family

## 2016-01-28 DIAGNOSIS — C641 Malignant neoplasm of right kidney, except renal pelvis: Secondary | ICD-10-CM | POA: Diagnosis not present

## 2016-01-28 DIAGNOSIS — D751 Secondary polycythemia: Secondary | ICD-10-CM | POA: Diagnosis not present

## 2016-01-28 DIAGNOSIS — N183 Chronic kidney disease, stage 3 (moderate): Secondary | ICD-10-CM | POA: Diagnosis not present

## 2016-01-28 NOTE — Assessment & Plan Note (Signed)
Heart failure appears stable and managed through risk factors including hypertension which appear adequately controlled. Physical exam appears euvolemic with no shortness of breath or leg swelling. Maintained on metoprolol for hypertension and sudden cardiac death. Continue to monitor with changes per cardiology.

## 2016-01-28 NOTE — Assessment & Plan Note (Signed)
Previously noted to have CK D stage II with 1 remaining kidney. Obtain complete metabolic profile for therapeutic drug monitoring and to check current kidney function. Consider addition of Ace/ARB for kidney protection.

## 2016-01-28 NOTE — Assessment & Plan Note (Signed)
Currently maintained on levothyroxine with no adverse side effects. Obtain TSH. Continue current dosage of levothyroxine pending TSH results.

## 2016-01-28 NOTE — Assessment & Plan Note (Signed)
Hyperlipidemia appears stable and currently maintained on omega-3 fatty acids and managed by the lipid clinic. Continue current dosage of omega-3 fatty acids with changes and follow-up per lipid clinic.

## 2016-01-28 NOTE — Assessment & Plan Note (Signed)
Anxiety currently managed with alprazolam and sertraline appears stable with no adverse side effects. Discussed importance of stress and stress management techniques. Continue current dosage of sertraline and alprazolam. Continue to monitor.

## 2016-01-30 ENCOUNTER — Encounter (HOSPITAL_COMMUNITY): Payer: Self-pay

## 2016-01-30 NOTE — Progress Notes (Signed)
Marland Kitchen    HEMATOLOGY/ONCOLOGY CLINIC NOTE  Date of Service: 01/30/2016  Patient Care Team: [place], FNP as PCP - General (Family Medicine) Dr Marval Regal (nephrology) Dr Ena Dawley -cardiology  Dr Tresa Moore (urology)  CHIEF COMPLAINTS/PURPOSE OF CONSULTATION:  Erythrocytosis and leukocytosis  HISTORY OF PRESENTING ILLNESS:   Kevin English is a wonderful 60 y.o. male who has been referred to [place] by Dr .Mauricio Po, [place] and Dr Marval Regal MD for evaluation and management of erythrocytosis and leukocytosis.  Patient has a history of hypertension, diabetes, coronary artery disease status post PCI, chronic kidney disease, we talked block status post permanent pacemaker, rheumatic heart disease with mitral valve replacement(mechanical mitral valve), hypogonadism hypothyroidism, persistent atrial fibrillation, smoker with 4-5 cigars a day, sleep apnea using CPAP (has not had his CPAP settings checked for nearly 10 years or more).  Patient also has a history of renal cell carcinoma diagnosed in June 2015 Patient also reports issues with anxiety and depression.  Patient is being followed up closely by nephrology and had recent labs on 11/08/2015 showed a hemoglobin of 18.1 and a hematocrit of 52.7 with a normal platelet count of 234k and mild leukocytosis of 15.9k with  9.9k neutrophils and 4.5k lymphocytes.  Previous labs in June 2017 showed a hemoglobin of 19.3 with hematocrit of 56.1 and a WBC count of 19.9k with normal platelets. Looking back and labs available in our system his CBC from 06/02/2014 showed a normal hemoglobin of 13.5 with hematocrit of 41.2 and platelet count of 392k with leukocytosis of 15.9 k.  Patient notes he has previously been on testosterone replacement or a few months in 2011/2012 . Notes that his CPAP settings have not been reevaluated for more than 10 years. Smokes about 4-5 cigars a day.  No chest pain or shortness of breath no new headaches or focal  neurological deficits. Patient reports no new fatigue.  He is rather dismissive of his lab findings and is quite resistant to detailed discussions about possibilities of his condition. He feels that his wife is overly concerned about him and he he just wants to be left alone.   INTERVAL HISTORY  Kevin English is here for a followup to discuss findings of his bone marrow examination. He reports no acute new symptoms. He remains very anxious about his overall health as usual and is very impatient and wants to leave the clinic as soon as possible. His bone marrow examination did not show any definitive evidence of myeloproliferative disorder or myelodysplastic syndrome.   MEDICAL HISTORY:  Past Medical History:  Diagnosis Date  . Anxiety   . Borderline diabetes   . CAD (coronary artery disease)    a. By cath 08/2013: 70% distal cx-prox LPDA; tandem mod LAD lesions, o/w mild dz.  . CKD (chronic kidney disease), stage II    his creat. runs 2-2.5; hasnt seen neph yet - sees dr. Tresa Moore at [facility]  . Complete heart block (HCC)    a. s/p PPM   . Deafness in left ear   . Depression   . Diverticulitis of colon 15 years ago  . Hernia   . HTN (hypertension)   . Hx of echocardiogram    post MVR >> Echo 4/16:  Mild LVH, EF 45-50%, apical dyskinesis, trivial AI, mechanical MVR functioning appropriately, severe LAE  . Hyperlipidemia   . Hypogonadism male   . Hypothyroidism   . Lung nodule seen on imaging study 04/10/2014   5 mm nodule right lower lobe  .  Mitral stenosis    a. Dx 07/2013 - through workup of 2D echo, TEE, and cath, felt to be moderate.  . Obesity   . OSA on CPAP   . Persistent atrial fibrillation ([place])    a. sustained 160-180 on e-CARDIO monitor placed 07/29/2013. Placed on IV amiodarone at end of May 2015 due to continued paroxysms with RVR.  Marland Kitchen Renal cell carcinoma of right kidney ([place]) 09/05/2013   clear cell renal cell carcinoma of right kidney treated by radical nephrectomy, Fuhrman  grade 2-3, with maximum tumor diameter 2.7 cm, all tumor to find confined to the kidney, and all surgical margins negative (T1aN0).   . Renal mass    a. Dx 08/2013: concerning for renal cancer.  . Rheumatic heart disease   . S/P Minimally invasive maze operation for atrial fibrillation 05/15/2014   Left side lesion set using cryothermy with clipping of LA appendage  . S/P minimally invasive mitral valve replacement with metallic valve and maze procedure 05/15/2014   2mm Sorin [employer]omed[employer] Optiform mechanical valve placed via right mini thoracotomy approach  . Stroke [facility]) 1998   "MRI showed 3 mild strokes"    SURGICAL HISTORY: Past Surgical History:  Procedure Laterality Date  . CARDIAC CATHETERIZATION  08/28/13  . EYE SURGERY Right 1990   "reconstructive, lens implant- from paintball accident"  . INGUINAL HERNIA REPAIR Bilateral first one in 80's   "I had one side done twice"  . LEFT AND RIGHT HEART CATHETERIZATION WITH CORONARY ANGIOGRAM N/A 08/28/2013   Procedure: LEFT AND RIGHT HEART CATHETERIZATION WITH CORONARY ANGIOGRAM;  Surgeon: Leonie Man, MD;  Location: [facility] CATH LAB;  Service: Cardiovascular;  Laterality: N/A;  . MINIMALLY INVASIVE MAZE PROCEDURE N/A 05/15/2014   Procedure: MINIMALLY INVASIVE MAZE PROCEDURE;  Surgeon: Rexene Alberts, MD;  Location: [place];  Service: Open Heart Surgery;  Laterality: N/A;  . MITRAL VALVE REPLACEMENT Right 05/15/2014   Procedure: MINIMALLY INVASIVE MITRAL VALVE (MV) REPLACEMENT;  Surgeon: Rexene Alberts, MD;  Location: [place];  Service: Open Heart Surgery;  Laterality: Right;  . PERMANENT PACEMAKER INSERTION Left 05/22/2014   Procedure: PERMANENT PACEMAKER INSERTION;  Surgeon: Thompson Grayer, MD;  Location: [facility] CATH LAB;  Service: Cardiovascular;  Laterality: Left;  . ROBOT ASSISTED LAPAROSCOPIC NEPHRECTOMY Right 09/05/2013   Procedure: ROBOTIC ASSISTED LAPAROSCOPIC RIGHT RADICAL NEPHRECTOMY;  Surgeon: Sharyn Creamer, MD;  Location: [facility];  Service:  Urology;  Laterality: Right;  . TEE WITHOUT CARDIOVERSION N/A 08/16/2013   Procedure: TRANSESOPHAGEAL ECHOCARDIOGRAM (TEE);  Surgeon: Dorothy Spark, MD;  Location: [place];  Service: Cardiovascular;  Laterality: N/A;  . TEE WITHOUT CARDIOVERSION N/A 05/15/2014   Procedure: TRANSESOPHAGEAL ECHOCARDIOGRAM (TEE);  Surgeon: Rexene Alberts, MD;  Location: [place];  Service: Open Heart Surgery;  Laterality: N/A;    SOCIAL HISTORY: Social History   Social History  . Marital status: Married    Spouse name: N/A  . Number of children: 2  . Years of education: 16   Occupational History  . Retired     Social History Main Topics  . Smoking status: Current Some Day Smoker    Packs/day: 0.25    Years: 40.00    Types: Cigarettes    Last attempt to quit: 05/14/2014  . Smokeless tobacco: Never Used     Comment: Cigar every now and then  . Alcohol use No  . Drug use: No  . Sexual activity: Not Currently   Other Topics Concern  . Not on file  Social History Narrative   Fun: [employer] people.     FAMILY HISTORY: Family History  Problem Relation Age of Onset  . Hypertension Mother   . Heart attack Mother   . Stroke Mother   . Hypertension      ALLERGIES:  is allergic to contrast media [iodinated diagnostic agents]; ioxaglate; atorvastatin; crestor [rosuvastatin]; fenofibrate; and pravastatin.  MEDICATIONS:  Current Outpatient Prescriptions  Medication Sig Dispense Refill  . acetaminophen (TYLENOL) 325 MG tablet Take 325 mg by mouth every 6 (six) hours as needed (pain).     Marland Kitchen ALPRAZolam (XANAX) 0.5 MG tablet Take 1-2 tablets (0.5-1 mg total) by mouth at bedtime as needed for anxiety or sleep. 90 tablet 2  . amLODipine (NORVASC) 10 MG tablet Take 1 tablet (10 mg total) by mouth daily. 90 tablet 3  . aspirin EC 81 MG tablet Take 1 tablet (81 mg total) by mouth daily.    . fluticasone (FLONASE) 50 MCG/ACT nasal spray Place 2 sprays into both nostrils daily.  2  . levothyroxine  (SYNTHROID, LEVOTHROID) 75 MCG tablet TAKE 1 TABLET EVERY DAY BEFORE BREAKFAST 90 tablet 1  . loratadine (CLARITIN) 10 MG tablet Take 1 tablet (10 mg total) by mouth daily.    . metoprolol (LOPRESSOR) 50 MG tablet Take 1.5 tablets (75 mg total) by mouth 2 (two) times daily. 270 tablet 3  . omega-3 acid ethyl esters (LOVAZA) 1 g capsule TAKE 2 CAPSULES TWICE A DAY 360 capsule 2  . sertraline (ZOLOFT) 50 MG tablet TAKE 1 TABLET (50 MG TOTAL) BY MOUTH DAILY. 90 tablet 0  . warfarin (COUMADIN) 5 MG tablet Take As directed by Anticoagulation Clinic 40 tablet 3   No current facility-administered medications for this visit.     REVIEW OF SYSTEMS:    10 Point review of Systems was done is negative except as noted above.  PHYSICAL EXAMINATION: ECOG PERFORMANCE STATUS: 2 - Symptomatic, <50% confined to bed  . Vitals:   01/21/16 1304  BP: 118/64  Pulse: 60  Resp: 18  Temp: 98.1 F (36.7 C)   Filed Weights   01/21/16 1304  Weight: 194 lb 8 oz (88.2 kg)   .Body mass index is 29.57 kg/m.  GENERAL:alert, in no acute distress and comfortable SKIN: skin color, texture, turgor are normal, no rashes or significant lesions EYES: normal, conjunctiva are pink and non-injected, sclera clear OROPHARYNX:no exudate, no erythema and lips, buccal mucosa, and tongue normal  NECK: supple, no JVD, thyroid normal size, non-tender, without nodularity LYMPH:  no palpable lymphadenopathy in the cervical, axillary or inguinal LUNGS: clear to auscultation with normal respiratory effort HEART: s1s2 irreg, mitral valve click noted ABDOMEN: abdomen soft, non-tender, normoactive bowel sounds , borderline palpable spleen. Musculoskeletal: no cyanosis of digits and no clubbing  PSYCH: alert & oriented x 3 with fluent speech NEURO: no focal motor/sensory deficits  LABORATORY DATA:  I have reviewed the data as listed  . CBC Latest Ref Rng & Units 01/09/2016 01/02/2016 12/03/2015  WBC 4.0 - 10.5 K/uL 16.2(H)  27.7(H) 16.8(H)  Hemoglobin 13.0 - 17.0 g/dL 17.3(H) 17.2(H) 19.0(H)  Hematocrit 39.0 - 52.0 % 52.1(H) 50.2(H) 56.6(H)  Platelets 150 - 400 K/uL 241 234 242    . CMP Latest Ref Rng & Units 01/27/2016 01/02/2016 01/02/2016  Glucose 70 - 99 mg/dL 111(H) 175(H) -  BUN 6 - 23 mg/dL 17 22.2 -  Creatinine 0.40 - 1.50 mg/dL 1.50 1.5(H) -  Sodium 135 - 145 mEq/L 139 140 -  Potassium 3.5 - 5.1 mEq/L 4.9 3.9 -  Chloride 96 - 112 mEq/L 107 - -  CO2 19 - 32 mEq/L 28 22 -  Calcium 8.4 - 10.5 mg/dL 10.2 10.5(H) 10.5(H)  Total Protein 6.0 - 8.3 g/dL 7.0 6.9 6.9  Total Bilirubin 0.2 - 1.2 mg/dL 0.4 0.25 -  Alkaline Phos 39 - 117 U/L 86 93 -  AST 0 - 37 U/L 18 15 -  ALT 0 - 53 U/L 24 21 -         Component     Latest Ref Rng & Units 01/02/2016 01/02/2016 01/02/2016        11:39 AM 11:39 AM 11:39 AM  IgG (Immunoglobin G), Serum     700 - 1,600 mg/dL  921   IgA/Immunoglobulin A, Serum     61 - 437 mg/dL  210   IgM, Qn, Serum     20 - 172 mg/dL  268 (H)   Total Protein     6.0 - 8.5 g/dL  6.9   Albumin SerPl Elph-Mcnc     2.9 - 4.4 g/dL  3.9   Alpha 1     0.0 - 0.4 g/dL  0.2   Alpha2 Glob SerPl Elph-Mcnc     0.4 - 1.0 g/dL  0.5   B-Globulin SerPl Elph-Mcnc     0.7 - 1.3 g/dL  1.2   Gamma Glob SerPl Elph-Mcnc     0.4 - 1.8 g/dL  1.1   M Protein SerPl Elph-Mcnc     Not Observed g/dL  Not Observed   Globulin, Total     2.2 - 3.9 g/dL  3.0   Albumin/Glob SerPl     0.7 - 1.7  1.4   IFE 1       Comment   Please Note (HCV):       Comment   Ig Kappa Free Light Chain     3.3 - 19.4 mg/L  24.5 (H)   Ig Lambda Free Light Chain     5.7 - 26.3 mg/L  22.9   Kappa/Lambda FluidC Ratio     0.26 - 1.65  1.07   Calcium     8.6 - 10.2 mg/dL   10.5 (H)  PTH     15 - 65 pg/mL   34  LDH     125 - 245 U/L 350 (H)    Beta 2     0.6 - 2.4 mg/L  3.2 (H)   Haptoglobin     34 - 200 mg/dL  <10 (L)   Hep C Virus Ab     0.0 - 0.9 s/co ratio  <0.1   Procalcitonin     0.00 - 0.08 ng/mL  0.04     Component     Latest Ref Rng & Units 01/02/2016        11:39 AM  IgG (Immunoglobin G), Serum     700 - 1,600 mg/dL   IgA/Immunoglobulin A, Serum     61 - 437 mg/dL   IgM, Qn, Serum     20 - 172 mg/dL   Total Protein     6.0 - 8.5 g/dL   Albumin SerPl Elph-Mcnc     2.9 - 4.4 g/dL   Alpha 1     0.0 - 0.4 g/dL   Alpha2 Glob SerPl Elph-Mcnc     0.4 - 1.0 g/dL   B-Globulin SerPl Elph-Mcnc     0.7 - 1.3 g/dL   Gamma Glob SerPl  Elph-Mcnc     0.4 - 1.8 g/dL   M Protein SerPl Elph-Mcnc     Not Observed g/dL   Globulin, Total     2.2 - 3.9 g/dL   Albumin/Glob SerPl     0.7 - 1.7   IFE 1        Please Note (HCV):        Ig Kappa Free Light Chain     3.3 - 19.4 mg/L   Ig Lambda Free Light Chain     5.7 - 26.3 mg/L   Kappa/Lambda FluidC Ratio     0.26 - 1.65   Calcium     8.6 - 10.2 mg/dL   PTH     15 - 65 pg/mL Comment  LDH     125 - 245 U/L   Beta 2     0.6 - 2.4 mg/L   Haptoglobin     34 - 200 mg/dL   Hep C Virus Ab     0.0 - 0.9 s/co ratio   Procalcitonin     0.00 - 0.08 ng/mL               RADIOGRAPHIC STUDIES: I have personally reviewed the radiological images as listed and agreed with the findings in the report. Ct Biopsy  Result Date: 01/09/2016 INDICATION: POLYCYTHEMIA, LEUKOCYTOSIS, CONCERN FOR MYELODYSPLASTIC SYNDROME EXAM: CT GUIDED RIGHT ILIAC BONE MARROW ASPIRATION AND CORE BIOPSY Date:  10/19/201710/19/2017 10:32 am Radiologist:  M. Daryll Brod, MD Guidance:  CT FLUOROSCOPY TIME:  Fluoroscopy Time: NONE. MEDICATIONS: None. ANESTHESIA/SEDATION: 3.0 mg IV Versed; 150 mcg IV Fentanyl Moderate Sedation Time:  11 minutes The patient was continuously monitored during the procedure by the interventional radiology nurse under my direct supervision. CONTRAST:  None. COMPLICATIONS: None PROCEDURE: Informed consent was obtained from the patient following explanation of the procedure, risks, benefits and alternatives. The patient understands, agrees and  consents for the procedure. All questions were addressed. A time out was performed. The patient was positioned prone and non-contrast localization CT was performed of the pelvis to demonstrate the iliac marrow spaces. Maximal barrier sterile technique utilized including caps, mask, sterile gowns, sterile gloves, large sterile drape, hand hygiene, and Betadine prep. Under sterile conditions and local anesthesia, an 11 gauge coaxial bone biopsy needle was advanced into the right iliac marrow space. Needle position was confirmed with CT imaging. Initially, bone marrow aspiration was performed. Next, the 11 gauge outer cannula was utilized to obtain a right iliac bone marrow core biopsy. Needle was removed. Hemostasis was obtained with compression. The patient tolerated the procedure well. Samples were prepared with the cytotechnologist. No immediate complications. IMPRESSION: CT guided right iliac bone marrow aspiration and core biopsy. Electronically Signed   By: Jerilynn Mages.  Shick M.D.   On: 01/09/2016 11:13   CT abdomen results from Dr. Tresa Moore. He had a CT of the abdomen with and without contrast on 01/01/2016. Impression Status post right radical nephrectomy. No findings to suggest local recurrence of disease or definite metastatic disease in the abdomen. Colonic diverticulosis without evidence to suggest acute diverticulitis at this time.  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 Patient has other potential secondary etiologies for polycythemia including active smoking , possibly suboptimally treated sleep apnea. He reports no use  of heating or generator to increase chance of carbon monoxide poisoning . Could have some element of dehydration with hemoconcentration .  Patient polycythemia has nearly  resolved since his last visit. He notes he is trying to cut down smoking but is not sure he can do it.  2) Leukocytosis with neutrophilia, mild lymphocytosis and monocytosis. could be reactive (smoking, paraneoplastic) versus rule out myeloproliferative neoplasm . BCR-ABL testing neg for CML. flow cytometry does not show any clonal lymphocyte population procalcitonin WNL --makes bacterial infection unlikely. Hep C neg  3) Elevated LDH -- ?etiology ? Bone marrow turnover.  Noted to have low haptoglobin that might suggest low level hemolysis from his mechanical aortic valve.  4) h/o Renal Cell carcinoma T1aNo s/p left nephrectomy in 08/2013. Was been monitored by Dr Tresa Moore. He has no clinical symptomatology suggestive of disease recurrence at this time.  5) h/o Lung nodules 33mm in RLL of lung in 03/2014, CXR on 09/25/2015 shows chronic lung disease with scarring in the bases and negative for metastatic disease.  -Continue monitoring with primary care physician  6) Hypercalcemia - PTH level wnl, SPEP with no M spike IFE showed polyclonal IgM kappa and Lambda gammopathy - ?reactive. PLAN -Bone marrow examination results were reviewed with the patient. No definitive evidence of myeloproliferative neoplasm or myelodysplastic syndrome . -Cytogenetic showed 2 clonal cell lines one that is normal -constituted 65% of the cells . The other cell line was cytogenetically normal missing the Y chromosome . This can be age related, association with any specific disease state cannot be determined and is uncertain . -Patient counseled on maintaining good hydration. -We again discussed imperative needed smoking cessation absolutely- he doesn't have much appetite for the discussion nor working on smoking cessation . -Patient needs to be seen by his pulmonologist for repeat sleep study to optimize treatment of his sleep apnea and also to consider PFT's to determine presence of hypoxia from pulmonary and cardiac issues  that might need O2 therapy. He notes he does not want to follow-up with his pulmonologist in [place] and was given a referral to [place] [facility].  - [place] dopppler renal to r/o RAS with PCP -recommended PET/CT to r/o lymphoma and r/o metastatic renal cell carcinoma given polycythemia (can be seen with RCC), hypercalcemia, elevated LDH. Patient refused this currently - will defer this decision to his PCP/Nephrologist (referring physician) -given spontaneous improvement in HCT - no role for therapeutic phlebotomy at this time. in the absence of a myeloproliferative neoplasm no clear cut off for therapeutic phlebotomy . Treatment would be strongly addressing underlying secondary causes of his polycythemia including smoking sleep apnea etc. -on coumadin per cardiology -Patient does not keen to follow-up with [place] and would like to continue following with his primary care physician. -Would recommend consideration of a second opinion at [facility] if blood counts worsen.  7). Patient Active Problem List   Diagnosis Date Noted  . Anxiety state 01/27/2016  . Essential hypertension 03/09/2015  . Chronic diastolic CHF (congestive heart failure), NYHA class 2 ([place]) 03/09/2015  . Coronary artery disease due to lipid rich plaque 03/09/2015  . S/P placement of cardiac pacemaker 06/12/2014  . CHB (complete heart block) ([place]) 05/22/2014  . S/P minimally invasive mitral valve replacement with metallic valve and maze procedure 05/15/2014  . S/P Minimally invasive maze operation for atrial fibrillation 05/15/2014  . Yeast dermatitis 04/19/2014  . Atrial fibrillation ([place])   . Lung nodule seen on imaging study 04/10/2014  .  Renal neoplasm 09/05/2013  . Renal cell carcinoma of right kidney-nephrectomy June 2015 09/05/2013  . CAD (coronary artery disease)   . CKD (chronic kidney disease), stage II   . Long term (current) use of anticoagulants 08/21/2013  . Encounter for therapeutic drug  monitoring 08/21/2013  . Atrial fibrillation with RVR ([place]) 08/18/2013  . Chronic anticoagulation 08/11/2013  . Paroxysmal atrial fibrillation 08/11/2013  . Hypothyroid 08/11/2013  . Fatigue 08/11/2013  . Dyspnea on exertion 08/11/2013  . Acute on chronic diastolic CHF (congestive heart failure), NYHA class 3 ([place]) 08/09/2013  . Chronic diastolic CHF (congestive heart failure) 08/09/2013  . Rheumatic heart disease 08/02/2013  . Mitral stenosis 08/02/2013  . Other abnormal glucose   . Benign neoplasm of colon   . Hernia   . Chronic rhinitis   . Diverticulitis of colon   . Hyperlipidemia   . HTN (hypertension)   . Hypogonadism male   . Leukocytosis   . Myalgia and myositis, unspecified   . Obesity   . Sleep apnea   . Vitamin D deficiency    -might need more active management of his depression/anxiety -will defer this to PCP -Continue follow-up with primary care physician for coordination and management of all his medical co-morbidities and complex care co-ordination   I counseled the patient and his questions were answered with apparent satisfaction. The patient knows to call the clinic with any problems, questions or concerns.  I spent 30 minutes counseling the patient face to face. The total time spent in the appointment was 40 minutes and more than 50% was on counseling and direct patient cares.    Sullivan Lone MD MS AAHIVMS [facility] Hematology/Oncology Physician [facility]  (Office):       [phone] (Work cell):  [phone] (Fax):           [phone]   Addendum  Patient was informed about the cytogenetics results .

## 2016-02-04 ENCOUNTER — Ambulatory Visit (INDEPENDENT_AMBULATORY_CARE_PROVIDER_SITE_OTHER): Payer: PPO | Admitting: *Deleted

## 2016-02-04 DIAGNOSIS — Z5181 Encounter for therapeutic drug level monitoring: Secondary | ICD-10-CM

## 2016-02-04 DIAGNOSIS — I4891 Unspecified atrial fibrillation: Secondary | ICD-10-CM | POA: Diagnosis not present

## 2016-02-04 DIAGNOSIS — Z954 Presence of other heart-valve replacement: Secondary | ICD-10-CM | POA: Diagnosis not present

## 2016-02-04 DIAGNOSIS — I5033 Acute on chronic diastolic (congestive) heart failure: Secondary | ICD-10-CM

## 2016-02-04 LAB — POCT INR: INR: 3.4

## 2016-02-19 ENCOUNTER — Institutional Professional Consult (permissible substitution): Payer: Managed Care, Other (non HMO) | Admitting: Pulmonary Disease

## 2016-02-20 ENCOUNTER — Ambulatory Visit (INDEPENDENT_AMBULATORY_CARE_PROVIDER_SITE_OTHER): Payer: PPO | Admitting: Pulmonary Disease

## 2016-02-20 ENCOUNTER — Encounter: Payer: Self-pay | Admitting: Pulmonary Disease

## 2016-02-20 VITALS — BP 104/68 | HR 59 | Ht 68.0 in | Wt 195.0 lb

## 2016-02-20 DIAGNOSIS — R0600 Dyspnea, unspecified: Secondary | ICD-10-CM | POA: Diagnosis not present

## 2016-02-20 DIAGNOSIS — G4733 Obstructive sleep apnea (adult) (pediatric): Secondary | ICD-10-CM | POA: Insufficient documentation

## 2016-02-20 DIAGNOSIS — R0609 Other forms of dyspnea: Secondary | ICD-10-CM

## 2016-02-20 NOTE — Addendum Note (Signed)
Addended by: Benson Setting L on: 02/20/2016 12:09 PM   Modules accepted: Orders

## 2016-02-20 NOTE — Progress Notes (Signed)
Subjective:    Patient ID: Kevin English, male    DOB: Nov 10, 1955, 60 y.o.   MRN: [id number]  HPI   This is the case of Kevin English, 60 y.o. Male, who was referred by Dr. Sullivan Lone  in consultation regarding OSA.   As you very well know, patient has a 39 PY smoking history, quit 1 yr ago, does cigars, not known to have asthma or copd, S/P MVR 2/2 RHD, has been diagnosed with OSA since 2010. Unknown severity. He is on his 2nd cpap machine, likely 60 yrs old.  (03/2014  Prior to the CPAP, he had symptoms of hypersomnia, snoring, witnessed apneas, fatigue, unrefreshing sleep. He had a sleep study in [place] which showed sleep apnea and has since been on CPAP. Unsure settings. He feels significantly better with CPAP. More energy, less sleepiness, less hypersomnia. He is on his 2nd machine. Machine is working well.  He has a nasal mask.   Patient also with significant comorbidities. Recently, was found to have elevated RBC and Hb for which he is being worked up.      Review of Systems  Constitutional: Negative.  Negative for fever and unexpected weight change.  HENT: Positive for sinus pressure. Negative for congestion, dental problem, ear pain, nosebleeds, postnasal drip, rhinorrhea, sneezing, sore throat and trouble swallowing.   Eyes: Negative.  Negative for redness and itching.  Respiratory: Positive for cough. Negative for chest tightness, shortness of breath and wheezing.   Cardiovascular: Negative.  Negative for palpitations and leg swelling.  Gastrointestinal: Negative.  Negative for nausea and vomiting.  Endocrine: Negative.   Genitourinary: Negative.  Negative for dysuria.  Musculoskeletal: Negative.  Negative for joint swelling.  Skin: Negative.  Negative for rash.  Allergic/Immunologic: Positive for environmental allergies.  Neurological: Negative.  Negative for headaches.  Hematological: Bruises/bleeds easily.  Psychiatric/Behavioral: Negative.  Negative for dysphoric  mood. The patient is not nervous/anxious.     Past Medical History:  Diagnosis Date  . Anxiety   . Borderline diabetes   . CAD (coronary artery disease)    a. By cath 08/2013: 70% distal cx-prox LPDA; tandem mod LAD lesions, o/w mild dz.  . CKD (chronic kidney disease), stage II    his creat. runs 2-2.5; hasnt seen neph yet - sees dr. Tresa Moore at [facility]  . Complete heart block (HCC)    a. s/p PPM   . Deafness in left ear   . Depression   . Diverticulitis of colon 15 years ago  . Hernia   . HTN (hypertension)   . Hx of echocardiogram    post MVR >> Echo 4/16:  Mild LVH, EF 45-50%, apical dyskinesis, trivial AI, mechanical MVR functioning appropriately, severe LAE  . Hyperlipidemia   . Hypogonadism male   . Hypothyroidism   . Lung nodule seen on imaging study 04/10/2014   5 mm nodule right lower lobe  . Mitral stenosis    a. Dx 07/2013 - through workup of 2D echo, TEE, and cath, felt to be moderate.  . Obesity   . OSA on CPAP   . Persistent atrial fibrillation ([place])    a. sustained 160-180 on e-CARDIO monitor placed 07/29/2013. Placed on IV amiodarone at end of May 2015 due to continued paroxysms with RVR.  Marland Kitchen Renal cell carcinoma of right kidney ([place]) 09/05/2013   clear cell renal cell carcinoma of right kidney treated by radical nephrectomy, Fuhrman grade 2-3, with maximum tumor diameter 2.7 cm, all tumor to find  confined to the kidney, and all surgical margins negative (T1aN0).   . Renal mass    a. Dx 08/2013: concerning for renal cancer.  . Rheumatic heart disease   . S/P Minimally invasive maze operation for atrial fibrillation 05/15/2014   Left side lesion set using cryothermy with clipping of LA appendage  . S/P minimally invasive mitral valve replacement with metallic valve and maze procedure 05/15/2014   63mm Sorin Carbomedics Optiform mechanical valve placed via right mini thoracotomy approach  . Stroke [facility]) 1998   "MRI showed 3 mild strokes"  (-) other CA. S/P R nephrectomy  in 2016 for kidney CA and he is in remission.  (-) DVT.    Family History  Problem Relation Age of Onset  . Hypertension Mother   . Heart attack Mother   . Stroke Mother   . Hypertension       Past Surgical History:  Procedure Laterality Date  . CARDIAC CATHETERIZATION  08/28/13  . EYE SURGERY Right 1990   "reconstructive, lens implant- from paintball accident"  . INGUINAL HERNIA REPAIR Bilateral first one in 80's   "I had one side done twice"  . LEFT AND RIGHT HEART CATHETERIZATION WITH CORONARY ANGIOGRAM N/A 08/28/2013   Procedure: LEFT AND RIGHT HEART CATHETERIZATION WITH CORONARY ANGIOGRAM;  Surgeon: Leonie Man, MD;  Location: [facility] CATH LAB;  Service: Cardiovascular;  Laterality: N/A;  . MINIMALLY INVASIVE MAZE PROCEDURE N/A 05/15/2014   Procedure: MINIMALLY INVASIVE MAZE PROCEDURE;  Surgeon: Rexene Alberts, MD;  Location: [facility];  Service: Open Heart Surgery;  Laterality: N/A;  . MITRAL VALVE REPLACEMENT Right 05/15/2014   Procedure: MINIMALLY INVASIVE MITRAL VALVE (MV) REPLACEMENT;  Surgeon: Rexene Alberts, MD;  Location: [facility];  Service: Open Heart Surgery;  Laterality: Right;  . PERMANENT PACEMAKER INSERTION Left 05/22/2014   Procedure: PERMANENT PACEMAKER INSERTION;  Surgeon: Thompson Grayer, MD;  Location: [facility] CATH LAB;  Service: Cardiovascular;  Laterality: Left;  . ROBOT ASSISTED LAPAROSCOPIC NEPHRECTOMY Right 09/05/2013   Procedure: ROBOTIC ASSISTED LAPAROSCOPIC RIGHT RADICAL NEPHRECTOMY;  Surgeon: Sharyn Creamer, MD;  Location: [facility];  Service: Urology;  Laterality: Right;  . TEE WITHOUT CARDIOVERSION N/A 08/16/2013   Procedure: TRANSESOPHAGEAL ECHOCARDIOGRAM (TEE);  Surgeon: Dorothy Spark, MD;  Location: [facility];  Service: Cardiovascular;  Laterality: N/A;  . TEE WITHOUT CARDIOVERSION N/A 05/15/2014   Procedure: TRANSESOPHAGEAL ECHOCARDIOGRAM (TEE);  Surgeon: Rexene Alberts, MD;  Location: [facility];  Service: Open Heart Surgery;  Laterality: N/A;    Social History    Social History  . Marital status: Married    Spouse name: N/A  . Number of children: 2  . Years of education: 16   Occupational History  . Retired     Social History Main Topics  . Smoking status: Current Some Day Smoker    Packs/day: 0.25    Years: 40.00    Types: Cigarettes    Last attempt to quit: 05/14/2014  . Smokeless tobacco: Never Used     Comment: Cigar every now and then  . Alcohol use No  . Drug use: No  . Sexual activity: Not Currently   Other Topics Concern  . Not on file   Social History Narrative   Fun: [employer].    Married. On disability 2/2 CAD and kidney dse. Was in the law enforcement. Lives in [place].   Allergies  Allergen Reactions  . Contrast Media [Iodinated Diagnostic Agents] Hives and Other (See Comments)    Patient started  sneezing and coughing, patient broke out in hives, patient needs 13 hour prep if having IV contrast  . Ioxaglate Hives and Rash    Patient started sneezing and coughing, patient broke out in hives, patient needs 13 hour prep if having IV contrast  . Atorvastatin Other (See Comments)    Myalgias  . Crestor [Rosuvastatin] Other (See Comments)    Myalgias  . Fenofibrate Other (See Comments)    MYALGIAS   . Pravastatin Other (See Comments)    Myalgias      Outpatient Medications Prior to Visit  Medication Sig Dispense Refill  . acetaminophen (TYLENOL) 325 MG tablet Take 325 mg by mouth every 6 (six) hours as needed (pain).     Marland Kitchen ALPRAZolam (XANAX) 0.5 MG tablet Take 1-2 tablets (0.5-1 mg total) by mouth at bedtime as needed for anxiety or sleep. 90 tablet 2  . amLODipine (NORVASC) 10 MG tablet Take 1 tablet (10 mg total) by mouth daily. 90 tablet 3  . aspirin EC 81 MG tablet Take 1 tablet (81 mg total) by mouth daily.    . fluticasone (FLONASE) 50 MCG/ACT nasal spray Place 2 sprays into both nostrils daily.  2  . levothyroxine (SYNTHROID, LEVOTHROID) 75 MCG tablet TAKE 1 TABLET EVERY DAY BEFORE BREAKFAST 90  tablet 1  . loratadine (CLARITIN) 10 MG tablet Take 1 tablet (10 mg total) by mouth daily.    . metoprolol (LOPRESSOR) 50 MG tablet Take 1.5 tablets (75 mg total) by mouth 2 (two) times daily. 270 tablet 3  . omega-3 acid ethyl esters (LOVAZA) 1 g capsule TAKE 2 CAPSULES TWICE A DAY 360 capsule 2  . sertraline (ZOLOFT) 50 MG tablet TAKE 1 TABLET (50 MG TOTAL) BY MOUTH DAILY. 90 tablet 0  . warfarin (COUMADIN) 5 MG tablet Take As directed by Anticoagulation Clinic 40 tablet 3   No facility-administered medications prior to visit.    No orders of the defined types were placed in this encounter.        Objective:   Physical Exam  Vitals:  Vitals:   02/20/16 0840  BP: 104/68  Pulse: (!) 59  SpO2: 96%  Weight: 195 lb (88.5 kg)  Height: 5\' 8"  (1.727 m)    Constitutional/General:  Pleasant, well-nourished, well-developed, not in any distress,  Comfortably seating.  Well kempt  Body mass index is 29.65 kg/m. Wt Readings from Last 3 Encounters:  02/20/16 195 lb (88.5 kg)  01/27/16 192 lb (87.1 kg)  01/21/16 194 lb 8 oz (88.2 kg)     HEENT: Pupils equal and reactive to light and accommodation. Anicteric sclerae. Normal nasal mucosa.   No oral  lesions,  mouth clear,  oropharynx clear, no postnasal drip. (-) Oral thrush. No dental caries.  Airway - Mallampati class III  Neck: No masses. Midline trachea. No JVD, (-) LAD. (-) bruits appreciated.  Respiratory/Chest: Grossly normal chest. (-) deformity. (-) Accessory muscle use.  Symmetric expansion. (-) Tenderness on palpation.  Resonant on percussion.  Diminished BS on both lower lung zones. (-) wheezing, crackles, rhonchi (-) egophony  Cardiovascular: Regular rate and  rhythm, heart sounds normal, no murmur or gallops, (+) peripheral edema  Gastrointestinal:  Normal bowel sounds. Soft, non-tender. No hepatosplenomegaly.  (-) masses.   Musculoskeletal:  Normal muscle tone. Normal gait.   Extremities: Grossly normal.  (-) clubbing, cyanosis.  Trace  edema  Skin: (-) rash,lesions seen.   Neurological/Psychiatric : alert, oriented to time, place, person. Normal mood and affect  Assessment & Plan:  OSA (obstructive sleep apnea) Pt was diagnosed with OSA in 2010. Unknown severity. He is on his 2nd cpap machine, likely 60 yrs old. He was seeing Dr. Carren Rang, wants to transfer to [place] so all his care will be under [facility] system.   Prior to the CPAP, he had symptoms of hypersomnia, snoring, witnessed apneas, fatigue, unrefreshing sleep. He had a sleep study in [place] which showed sleep apnea and has since been on CPAP. Unsure settings. He feels significantly better with CPAP. More energy, less sleepiness, less hypersomnia. He is on his 2nd machine. Machine is working well.  He has a nasal mask.   Plan :  We extensively discussed the importance of treating OSA and the need to use PAP therapy.   Continue with  cpap machine. We will get office notes from Dr. Vonzella Nipple as well as copies of sleep studies. We will order supplies from [facility] in [place] as well as get a download in October and December 2017. Patient mentioned about transferring DME's but we will hold off unless they continue to  have issues with payment. He just switched to Medicare.   Patient was instructed to have mask, tubings, filter, reservoir cleaned at least once a week with soapy water.  Patient was instructed to call the office if he/she is having issues with the PAP device.    I advised patient to obtain sufficient amount of sleep --  7 to 8 hours at least in a 24 hr period.  Patient was advised to follow good sleep hygiene.  Patient was advised NOT to engage in activities requiring concentration and/or vigilance if he/she is and  sleepy.  Patient is NOT to drive if he/she is sleepy.    Exertional dyspnea Patient with 50-pack-year smoking history, quit 2016. Several comorbidities including status post mitral  valve replacement related to rheumatic heart disease as well as nephrectomy related to kidney cancer.  PFT (03/2014)  FEV1  2.83  84%, DLCO 74% ABG (04/2014)  7.5,39,67. CXR (09/2015) cardiomegaly. Atelectasis at the bases.  Recent elevated RBC/Hb being worked up. Patient with exertional dyspnea. Lab for repeat PFTs and ABG. I suspect, he might have persistent hypercapnia. May need ONO with cpap.       Thank you very much for letting me participate in this patient's care. Please do not hesitate to give me a call if you have any questions or concerns regarding the treatment plan.   Patient will follow up with me in 3 months    J. Shirl Harris, MD 02/20/2016   9:24 AM [facility] [place] Pager: [phone] Office: [phone], Fax: [phone]

## 2016-02-20 NOTE — Assessment & Plan Note (Signed)
Patient with 50-pack-year smoking history, quit 2016. Several comorbidities including status post mitral valve replacement related to rheumatic heart disease as well as nephrectomy related to kidney cancer.  PFT (03/2014)  FEV1  2.83  84%, DLCO 74% ABG (04/2014)  7.5,39,67. CXR (09/2015) cardiomegaly. Atelectasis at the bases.  Recent elevated RBC/Hb being worked up. Patient with exertional dyspnea. Lab for repeat PFTs and ABG. I suspect, he might have persistent hypercapnia. May need ONO with cpap.

## 2016-02-20 NOTE — Assessment & Plan Note (Signed)
Pt was diagnosed with OSA in 2010. Unknown severity. He is on his 2nd cpap machine, likely 60 yrs old. He was seeing Dr. Carren Rang, wants to transfer to [place] so all his care will be under [facility] system.   Prior to the CPAP, he had symptoms of hypersomnia, snoring, witnessed apneas, fatigue, unrefreshing sleep. He had a sleep study in [place] which showed sleep apnea and has since been on CPAP. Unsure settings. He feels significantly better with CPAP. More energy, less sleepiness, less hypersomnia. He is on his 2nd machine. Machine is working well.  He has a nasal mask.   Plan :  We extensively discussed the importance of treating OSA and the need to use PAP therapy.   Continue with  cpap machine. We will get office notes from Dr. Vonzella Nipple as well as copies of sleep studies. We will order supplies from [facility] in [place] as well as get a download in October and December 2017. Patient mentioned about transferring DME's but we will hold off unless they continue to  have issues with payment. He just switched to Medicare.   Patient was instructed to have mask, tubings, filter, reservoir cleaned at least once a week with soapy water.  Patient was instructed to call the office if he/she is having issues with the PAP device.    I advised patient to obtain sufficient amount of sleep --  7 to 8 hours at least in a 24 hr period.  Patient was advised to follow good sleep hygiene.  Patient was advised NOT to engage in activities requiring concentration and/or vigilance if he/she is and  sleepy.  Patient is NOT to drive if he/she is sleepy.

## 2016-02-20 NOTE — Patient Instructions (Signed)
  It was a pleasure taking care of you today!  Continue using your CPAP machine.  We will order you supplies and CPAP download.  We will order you breathing test and blood test.   Please make sure you use your CPAP device everytime you sleep.  We will monitor the usage of your machine per your insurance requirement.  Your insurance company may take the machine from you if you are not using it regularly.   Please clean the mask, tubings, filter, water reservoir with soapy water every week.  Please use distilled water for the water reservoir.   Please call the office or your machine provider (DME company) if you are having issues with the device.   Return to clinic in 3 months   with Dr. Corrie Dandy

## 2016-02-21 ENCOUNTER — Ambulatory Visit (INDEPENDENT_AMBULATORY_CARE_PROVIDER_SITE_OTHER): Payer: PPO | Admitting: Pulmonary Disease

## 2016-02-21 DIAGNOSIS — R0600 Dyspnea, unspecified: Secondary | ICD-10-CM | POA: Diagnosis not present

## 2016-02-21 LAB — PULMONARY FUNCTION TEST
DL/VA % pred: 83 %
DL/VA: 3.73 ml/min/mmHg/L
DLCO COR % PRED: 62 %
DLCO COR: 18.53 ml/min/mmHg
DLCO UNC % PRED: 63 %
DLCO UNC: 18.78 ml/min/mmHg
FEF 25-75 POST: 2.02 L/s
FEF 25-75 Pre: 1.53 L/sec
FEF2575-%Change-Post: 31 %
FEF2575-%Pred-Post: 76 %
FEF2575-%Pred-Pre: 57 %
FEV1-%Change-Post: 11 %
FEV1-%PRED-POST: 73 %
FEV1-%Pred-Pre: 66 %
FEV1-POST: 2.4 L
FEV1-Pre: 2.16 L
FEV1FVC-%Change-Post: 6 %
FEV1FVC-%Pred-Pre: 93 %
FEV6-%Change-Post: 4 %
FEV6-%PRED-PRE: 74 %
FEV6-%Pred-Post: 77 %
FEV6-POST: 3.21 L
FEV6-PRE: 3.08 L
FEV6FVC-%PRED-POST: 105 %
FEV6FVC-%PRED-PRE: 105 %
FVC-%Change-Post: 4 %
FVC-%PRED-PRE: 70 %
FVC-%Pred-Post: 73 %
FVC-POST: 3.21 L
FVC-PRE: 3.08 L
POST FEV6/FVC RATIO: 100 %
PRE FEV1/FVC RATIO: 70 %
PRE FEV6/FVC RATIO: 100 %
Post FEV1/FVC ratio: 75 %

## 2016-02-24 ENCOUNTER — Telehealth: Payer: Self-pay | Admitting: Pulmonary Disease

## 2016-02-24 MED ORDER — TIOTROPIUM BROMIDE-OLODATEROL 2.5-2.5 MCG/ACT IN AERS: 2.0000 | INHALATION_SPRAY | Freq: Every day | RESPIRATORY_TRACT | 4 refills | Status: DC

## 2016-02-24 MED ORDER — TIOTROPIUM BROMIDE-OLODATEROL 2.5-2.5 MCG/ACT IN AERS: 2.0000 | INHALATION_SPRAY | Freq: Every day | RESPIRATORY_TRACT | 0 refills | Status: DC

## 2016-02-24 NOTE — Telephone Encounter (Signed)
Spoke with pt and advised of PFT results and Dr Corrie Dandy recommendations.  Stiolto samples left at front desk ( pt will need instruction on how to use this when he picks up) and rx sent to pharmacy.

## 2016-02-26 ENCOUNTER — Ambulatory Visit (INDEPENDENT_AMBULATORY_CARE_PROVIDER_SITE_OTHER): Payer: PPO | Admitting: *Deleted

## 2016-02-26 ENCOUNTER — Telehealth: Payer: Self-pay | Admitting: Cardiology

## 2016-02-26 DIAGNOSIS — I442 Atrioventricular block, complete: Secondary | ICD-10-CM | POA: Diagnosis not present

## 2016-02-26 NOTE — Telephone Encounter (Signed)
Confirmed remote transmission w/ pt wife.   

## 2016-02-27 ENCOUNTER — Telehealth: Payer: Self-pay | Admitting: Pulmonary Disease

## 2016-02-27 NOTE — Telephone Encounter (Signed)
   Download from September until November 2017: CPAP 8 cm water, 99%, AHI 0.9.  Cont cpap 8 cm water.  Monica Becton, MD 02/27/2016, 1:22 PM [facility] Pager [phone] After 3 pm or if no answer, call [phone]

## 2016-02-27 NOTE — Progress Notes (Signed)
Remote pacemaker transmission.   

## 2016-02-28 ENCOUNTER — Encounter: Payer: Self-pay | Admitting: Cardiology

## 2016-03-03 ENCOUNTER — Emergency Department (HOSPITAL_COMMUNITY): Payer: PPO

## 2016-03-03 ENCOUNTER — Telehealth: Payer: Self-pay | Admitting: Cardiology

## 2016-03-03 ENCOUNTER — Encounter (HOSPITAL_COMMUNITY): Payer: Self-pay | Admitting: *Deleted

## 2016-03-03 ENCOUNTER — Emergency Department (HOSPITAL_COMMUNITY)
Admission: EM | Admit: 2016-03-03 | Discharge: 2016-03-03 | Disposition: A | Discharge status: Home / Self Care | Payer: PPO | Attending: Emergency Medicine | Admitting: Emergency Medicine

## 2016-03-03 DIAGNOSIS — E039 Hypothyroidism, unspecified: Secondary | ICD-10-CM | POA: Diagnosis not present

## 2016-03-03 DIAGNOSIS — I5032 Chronic diastolic (congestive) heart failure: Secondary | ICD-10-CM | POA: Diagnosis not present

## 2016-03-03 DIAGNOSIS — F17210 Nicotine dependence, cigarettes, uncomplicated: Secondary | ICD-10-CM | POA: Diagnosis not present

## 2016-03-03 DIAGNOSIS — Z7982 Long term (current) use of aspirin: Secondary | ICD-10-CM | POA: Diagnosis not present

## 2016-03-03 DIAGNOSIS — Z7901 Long term (current) use of anticoagulants: Secondary | ICD-10-CM | POA: Diagnosis not present

## 2016-03-03 DIAGNOSIS — Z85528 Personal history of other malignant neoplasm of kidney: Secondary | ICD-10-CM | POA: Diagnosis not present

## 2016-03-03 DIAGNOSIS — I130 Hypertensive heart and chronic kidney disease with heart failure and stage 1 through stage 4 chronic kidney disease, or unspecified chronic kidney disease: Secondary | ICD-10-CM | POA: Diagnosis not present

## 2016-03-03 DIAGNOSIS — N182 Chronic kidney disease, stage 2 (mild): Secondary | ICD-10-CM | POA: Diagnosis not present

## 2016-03-03 DIAGNOSIS — Z950 Presence of cardiac pacemaker: Secondary | ICD-10-CM | POA: Diagnosis not present

## 2016-03-03 DIAGNOSIS — I2510 Atherosclerotic heart disease of native coronary artery without angina pectoris: Secondary | ICD-10-CM | POA: Insufficient documentation

## 2016-03-03 DIAGNOSIS — M6281 Muscle weakness (generalized): Secondary | ICD-10-CM | POA: Insufficient documentation

## 2016-03-03 DIAGNOSIS — R29898 Other symptoms and signs involving the musculoskeletal system: Secondary | ICD-10-CM

## 2016-03-03 DIAGNOSIS — R531 Weakness: Secondary | ICD-10-CM | POA: Diagnosis not present

## 2016-03-03 LAB — CBC
HCT: 52.8 % — ABNORMAL HIGH (ref 39.0–52.0)
HEMOGLOBIN: 18 g/dL — AB (ref 13.0–17.0)
MCH: 33.4 pg (ref 26.0–34.0)
MCHC: 34.1 g/dL (ref 30.0–36.0)
MCV: 98 fL (ref 78.0–100.0)
PLATELETS: 224 10*3/uL (ref 150–400)
RBC: 5.39 MIL/uL (ref 4.22–5.81)
RDW: 15 % (ref 11.5–15.5)
WBC: 16 10*3/uL — ABNORMAL HIGH (ref 4.0–10.5)

## 2016-03-03 LAB — PROTIME-INR
INR: 1.95
Prothrombin Time: 22.5 seconds — ABNORMAL HIGH (ref 11.4–15.2)

## 2016-03-03 LAB — HEPATIC FUNCTION PANEL
ALK PHOS: 70 U/L (ref 38–126)
ALT: 31 U/L (ref 17–63)
AST: 30 U/L (ref 15–41)
Albumin: 3.6 g/dL (ref 3.5–5.0)
BILIRUBIN DIRECT: 0.2 mg/dL (ref 0.1–0.5)
BILIRUBIN INDIRECT: 0.4 mg/dL (ref 0.3–0.9)
BILIRUBIN TOTAL: 0.6 mg/dL (ref 0.3–1.2)
Total Protein: 6.7 g/dL (ref 6.5–8.1)

## 2016-03-03 LAB — URINALYSIS, ROUTINE W REFLEX MICROSCOPIC
BILIRUBIN URINE: NEGATIVE
Bacteria, UA: NONE SEEN
GLUCOSE, UA: 150 mg/dL — AB
Hgb urine dipstick: NEGATIVE
KETONES UR: NEGATIVE mg/dL
LEUKOCYTES UA: NEGATIVE
Nitrite: NEGATIVE
PH: 6 (ref 5.0–8.0)
PROTEIN: 30 mg/dL — AB
Specific Gravity, Urine: 1.015 (ref 1.005–1.030)

## 2016-03-03 LAB — BASIC METABOLIC PANEL
Anion gap: 8 (ref 5–15)
BUN: 17 mg/dL (ref 6–20)
CALCIUM: 9.7 mg/dL (ref 8.9–10.3)
CHLORIDE: 109 mmol/L (ref 101–111)
CO2: 21 mmol/L — AB (ref 22–32)
CREATININE: 1.44 mg/dL — AB (ref 0.61–1.24)
GFR calc non Af Amer: 51 mL/min — ABNORMAL LOW (ref >60)
GFR, EST AFRICAN AMERICAN: 59 mL/min — AB (ref >60)
GLUCOSE: 254 mg/dL — AB (ref 65–99)
Potassium: 4.3 mmol/L (ref 3.5–5.1)
Sodium: 138 mmol/L (ref 135–145)

## 2016-03-03 LAB — I-STAT TROPONIN, ED: TROPONIN I, POC: 0 ng/mL (ref 0.00–0.08)

## 2016-03-03 LAB — CK: Total CK: 44 U/L — ABNORMAL LOW (ref 49–397)

## 2016-03-03 NOTE — Care Management Note (Signed)
Case Management Note  Patient Details  Name: Kevin English MRN: [id number] Date of Birth: September 24, 1955  Subjective/Objective:                  60 y.o. male patient with history of heart valve replacement, kidney cancer treated with nephrectomy, complete heart block with pacemaker -- presents with c/o gradual onset, gradual worsening lower extremity weakness. From home with spouse.  Action/Plan: Follow for disposition needs. /Follow-up appointment.   Expected Discharge Date:  03/03/16               Expected Discharge Plan:  Home/Self Care  In-House Referral:  NA  Discharge planning Services  CM Consult, Follow-up appt scheduled  Post Acute Care Choice:  NA Choice offered to:  NA  DME Arranged:  N/A DME Agency:  NA  HH Arranged:  NA HH Agency:  NA  Status of Service:  Completed, signed off    Fuller Mandril, RN 03/03/2016, 2:30 PM

## 2016-03-03 NOTE — ED Provider Notes (Signed)
[facility] DEPT Provider Note   CSN: [id number] Arrival date & time: 03/03/16  [id number]     History   Chief Complaint Chief Complaint  Patient presents with  . Weakness    HPI Kevin English is a 60 y.o. male.  Patient with history of heart valve replacement, kidney cancer treated with nephrectomy, complete heart block with pacemaker -- presents with c/o gradual onset, gradual worsening lower extremity weakness. Patient states that his thighs are what feels weak. He initially attributed this to 'not exercising'. Over the past week or so it has affected is gait. Today he fell while squatting over the toilet. No injuries from falling and he did not hit his head. Wife insisted that he come to the hospital for evaluation. No HA. Patient denies signs of stroke including: facial droop, slurred speech, aphasia, weakness/numbness in extremities. States that he feels very wobbly with walking. Symptoms tend to wax and wane. No fever, CP, cough, vomiting, diarrhea.         Past Medical History:  Diagnosis Date  . Anxiety   . Borderline diabetes   . CAD (coronary artery disease)    a. By cath 08/2013: 70% distal cx-prox LPDA; tandem mod LAD lesions, o/w mild dz.  . CKD (chronic kidney disease), stage II    his creat. runs 2-2.5; hasnt seen neph yet - sees dr. Tresa [facility]  . Complete heart block (HCC)    a. s/p PPM   . Deafness in left ear   . Depression   . Diverticulitis of colon 15 years ago  . Hernia   . HTN (hypertension)   . Hx of echocardiogram    post MVR >> Echo 4/16:  Mild LVH, EF 45-50%, apical dyskinesis, trivial AI, mechanical MVR functioning appropriately, severe LAE  . Hyperlipidemia   . Hypogonadism male   . Hypothyroidism   . Lung nodule seen on imaging study 04/10/2014   5 mm nodule right lower lobe  . Mitral stenosis    a. Dx 07/2013 - through workup of 2D echo, TEE, and cath, felt to be moderate.  . Obesity   . OSA on CPAP   . Persistent atrial  fibrillation ([place])    a. sustained 160-180 on e-CARDIO monitor placed 07/29/2013. Placed on IV amiodarone at end of May 2015 due to continued paroxysms with RVR.  Marland Kitchen Renal cell carcinoma of right kidney ([place]) 09/05/2013   clear cell renal cell carcinoma of right kidney treated by radical nephrectomy, Fuhrman grade 2-3, with maximum tumor diameter 2.7 cm, all tumor to find confined to the kidney, and all surgical margins negative (T1aN0).   . Renal mass    a. Dx 08/2013: concerning for renal cancer.  . Rheumatic heart disease   . S/P Minimally invasive maze operation for atrial fibrillation 05/15/2014   Left side lesion set using cryothermy with clipping of LA appendage  . S/P minimally invasive mitral valve replacement with metallic valve and maze procedure 05/15/2014   44mm Sorin Carbomedics Optiform mechanical valve placed via right mini thoracotomy approach  . Stroke [facility]) 1998   "MRI showed 3 mild strokes"    Patient Active Problem List   Diagnosis Date Noted  . OSA (obstructive sleep apnea) 02/20/2016  . Anxiety state 01/27/2016  . Essential hypertension 03/09/2015  . Chronic diastolic CHF (congestive heart failure), NYHA class 2 ([place]) 03/09/2015  . Coronary artery disease due to lipid rich plaque 03/09/2015  . S/P placement of cardiac pacemaker 06/12/2014  . CHB (  complete heart block) ([place]) 05/22/2014  . S/P minimally invasive mitral valve replacement with metallic valve and maze procedure 05/15/2014  . S/P Minimally invasive maze operation for atrial fibrillation 05/15/2014  . Yeast dermatitis 04/19/2014  . Atrial fibrillation ([place])   . Lung nodule seen on imaging study 04/10/2014  . Renal neoplasm 09/05/2013  . Renal cell carcinoma of right kidney-nephrectomy June 2015 09/05/2013  . CAD (coronary artery disease)   . CKD (chronic kidney disease), stage II   . Long term (current) use of anticoagulants 08/21/2013  . Encounter for therapeutic drug monitoring 08/21/2013  . Atrial  fibrillation with RVR ([place]) 08/18/2013  . Chronic anticoagulation 08/11/2013  . Paroxysmal atrial fibrillation 08/11/2013  . Hypothyroid 08/11/2013  . Fatigue 08/11/2013  . Exertional dyspnea 08/11/2013  . Acute on chronic diastolic CHF (congestive heart failure), NYHA class 3 ([place]) 08/09/2013  . Chronic diastolic CHF (congestive heart failure) 08/09/2013  . Rheumatic heart disease 08/02/2013  . Mitral stenosis 08/02/2013  . Other abnormal glucose   . Benign neoplasm of colon   . Hernia   . Chronic rhinitis   . Diverticulitis of colon   . Hyperlipidemia   . HTN (hypertension)   . Hypogonadism male   . Leukocytosis   . Myalgia and myositis, unspecified   . Obesity   . Sleep apnea   . Vitamin D deficiency     Past Surgical History:  Procedure Laterality Date  . CARDIAC CATHETERIZATION  08/28/13  . EYE SURGERY Right 1990   "reconstructive, lens implant- from paintball accident"  . INGUINAL HERNIA REPAIR Bilateral first one in 80's   "I had one side done twice"  . LEFT AND RIGHT HEART CATHETERIZATION WITH CORONARY ANGIOGRAM N/A 08/28/2013   Procedure: LEFT AND RIGHT HEART CATHETERIZATION WITH CORONARY ANGIOGRAM;  Surgeon: Leonie Man, MD;  Location: [facility] CATH LAB;  Service: Cardiovascular;  Laterality: N/A;  . MINIMALLY INVASIVE MAZE PROCEDURE N/A 05/15/2014   Procedure: MINIMALLY INVASIVE MAZE PROCEDURE;  Surgeon: Rexene Alberts, MD;  Location: [facility];  Service: Open Heart Surgery;  Laterality: N/A;  . MITRAL VALVE REPLACEMENT Right 05/15/2014   Procedure: MINIMALLY INVASIVE MITRAL VALVE (MV) REPLACEMENT;  Surgeon: Rexene Alberts, MD;  Location: [place];  Service: Open Heart Surgery;  Laterality: Right;  . PERMANENT PACEMAKER INSERTION Left 05/22/2014   Procedure: PERMANENT PACEMAKER INSERTION;  Surgeon: Thompson Grayer, MD;  Location[facility] C[facility] LAB;  Service: Cardiovascular;  Laterality: Left;  . ROBOT ASSISTED LAPAROSCOPIC NEPHRECTOMY Right 09/05/2013   Procedure: ROBOTIC ASSISTED  LAPAROSCOPIC RIGHT RADICAL NEPHRECTOMY;  Surgeon: Sharyn Creamer, MD;  Location: [facility] ORS;  Service: Urology;  Laterality: Right;  . TEE WITHOUT CARDIOVERSION N/A 08/16/2013   Procedure: TRANSESOPHAGEAL ECHOCARDIOGRAM (TEE);  Surgeon: Dorothy Spark, MD;  Location: [facility];  Service: Cardiovascular;  Laterality: N/A;  . TEE WITHOUT CARDIOVERSION N/A 05/15/2014   Procedure: TRANSESOPHAGEAL ECHOCARDIOGRAM (TEE);  Surgeon: Rexene Alberts, MD;  Location: [place];  Service: Open Heart Surgery;  Laterality: N/A;       Home Medications    Prior to Admission medications   Medication Sig Start Date End Date Taking? Authorizing Provider  acetaminophen (TYLENOL) 325 MG tablet Take 325 mg by mouth every 6 (six) hours as needed (pain).     Historical Provider, MD  ALPRAZolam Duanne Moron) 0.5 MG tablet Take 1-2 tablets (0.5-1 mg total) by mouth at bedtime as needed for anxiety or sleep. 01/27/16   Golden Circle, FNP  amLODipine (NORVASC) 10 MG tablet  Take 1 tablet (10 mg total) by mouth daily. 07/22/15   Liliane Shi, PA-C  aspirin EC 81 MG tablet Take 1 tablet (81 mg total) by mouth daily. 05/26/14   Erin R Barrett, PA-C  fluticasone (FLONASE) 50 MCG/ACT nasal spray Place 2 sprays into both nostrils daily. 09/10/15   Dorothy Spark, MD  levothyroxine (SYNTHROID, LEVOTHROID) 75 MCG tablet TAKE 1 TABLET EVERY DAY BEFORE BREAKFAST 09/18/15   Dorothy Spark, MD  loratadine (CLARITIN) 10 MG tablet Take 1 tablet (10 mg total) by mouth daily. 09/10/15   Dorothy Spark, MD  metoprolol (LOPRESSOR) 50 MG tablet Take 1.5 tablets (75 mg total) by mouth 2 (two) times daily. 08/07/15   Liliane Shi, PA-C  omega-3 acid ethyl esters (LOVAZA) 1 g capsule TAKE 2 CAPSULES TWICE A DAY 12/02/15   Dorothy Spark, MD  sertraline (ZOLOFT) 50 MG tablet TAKE 1 TABLET (50 MG TOTAL) BY MOUTH DAILY. 01/27/16   Golden Circle, FNP  Tiotropium Bromide-Olodaterol (STIOLTO RESPIMAT) 2.5-2.5 MCG/ACT AERS Inhale 2 puffs into the  lungs daily. 02/24/16   Jose Shirl Harris, MD  warfarin (COUMADIN) 5 MG tablet Take As directed by Anticoagulation Clinic 01/20/16   Dorothy Spark, MD    Family History Family History  Problem Relation Age of Onset  . Hypertension Mother   . Heart attack Mother   . Stroke Mother   . Hypertension      Social History Social History  Substance Use Topics  . Smoking status: Current Some Day Smoker    Packs/day: 0.25    Years: 40.00    Types: Cigarettes    Last attempt to quit: 05/14/2014  . Smokeless tobacco: Never Used     Comment: Cigar every now and then  . Alcohol use No     Allergies   Contrast media [iodinated diagnostic agents]; Ioxaglate; Atorvastatin; Crestor [rosuvastatin]; Fenofibrate; and Pravastatin   Review of Systems Review of Systems  Constitutional: Negative for fever.  HENT: Negative for rhinorrhea and sore throat.   Eyes: Negative for redness.  Respiratory: Negative for cough.   Cardiovascular: Negative for chest pain.  Gastrointestinal: Negative for abdominal pain, diarrhea, nausea and vomiting.  Genitourinary: Negative for dysuria.  Musculoskeletal: Positive for gait problem. Negative for myalgias.  Skin: Negative for rash.  Neurological: Positive for weakness. Negative for headaches.     Physical Exam Updated Vital Signs BP 126/81   Pulse (!) 59   Temp 97.9 F (36.6 C) (Oral)   Resp 16   SpO2 98%   Physical Exam  Constitutional: He appears well-developed and well-nourished.  HENT:  Head: Normocephalic and atraumatic.  Eyes: Conjunctivae are normal. Right eye exhibits no discharge. Left eye exhibits no discharge.  Irregular R pupil s/p surgical changes  Neck: Normal range of motion. Neck supple.  Cardiovascular: Normal rate, regular rhythm and normal heart sounds.   Pulmonary/Chest: Effort normal and breath sounds normal.  Abdominal: Soft. There is no tenderness.  Neurological: He is alert. He displays no atrophy and no tremor.  No cranial nerve deficit or sensory deficit. He exhibits normal muscle tone. Gait abnormal. Coordination normal.  Reflex Scores:      Patellar reflexes are 1+ on the right side and 2+ on the left side. Patient with trace decreased strength of quadriceps, otherwise normal exam. He is able to stand. He is efforting to keep balance with walking and has trouble with stabilization, but is able to ambulate.   Skin:  Skin is warm and dry.  Psychiatric: He has a normal mood and affect.  Nursing note and vitals reviewed.    ED Treatments / Results  Labs (all labs ordered are listed, but only abnormal results are displayed) Labs Reviewed  BASIC METABOLIC PANEL - Abnormal; Notable for the following:       Result Value   CO2 21 (*)    Glucose, Bld 254 (*)    Creatinine, Ser 1.44 (*)    GFR calc non Af Amer 51 (*)    GFR calc Af Amer 59 (*)    All other components within normal limits  CBC - Abnormal; Notable for the following:    WBC 16.0 (*)    Hemoglobin 18.0 (*)    HCT 52.8 (*)    All other components within normal limits  PROTIME-INR - Abnormal; Notable for the following:    Prothrombin Time 22.5 (*)    All other components within normal limits  URINALYSIS, ROUTINE W REFLEX MICROSCOPIC - Abnormal; Notable for the following:    Glucose, UA 150 (*)    Protein, ur 30 (*)    Squamous Epithelial / LPF 0-5 (*)    All other components within normal limits  CK - Abnormal; Notable for the following:    Total CK 44 (*)    All other components within normal limits  HEPATIC FUNCTION PANEL  I-STAT TROPOININ, ED    EKG  EKG Interpretation None       Radiology Dg Chest 2 View  Result Date: 03/03/2016 CLINICAL DATA:  Weakness in the extremities for the past several days with worsening symptoms. No chest complaints. Long history of smoking discontinued 1 week ago. History of mitral valve replacement and Maze procedure and permanent pacemaker placement. EXAM: CHEST  2 VIEW COMPARISON:   Chest x-ray of September 25, 2015 FINDINGS: The lungs are adequately inflated. There is chronic scarring in the right lower lobe. The heart and pulmonary vascularity are normal. The left atrial appendage clip in the prosthetic mitral valve ring are in stable position. The ICD is also in stable position. There is no pleural effusion. The mediastinum is normal in width. The bony thorax is unremarkable. IMPRESSION: No acute cardiopulmonary abnormality. Chronic left lower lobe scarring. Electronically Signed   By: David  Martinique M.D.   On: 03/03/2016 10:57    Procedures Procedures (including critical care time)  Medications Ordered in ED Medications - No data to display   Initial Impression / Assessment and Plan / ED Course  I have reviewed the triage vital signs and the nursing notes.  Pertinent labs & imaging results that were available during my care of the patient were reviewed by me and considered in my medical decision making (see chart for details).  Clinical Course    Patient seen and examined. Discussed with Dr. Dayna Barker who has seen. Work-up initiated.   CT ordered to assess L-spine given h/o kidney CA. Cannot order MRI 2/2 pacemaker.   Vital signs reviewed and are as follows: BP 126/81   Pulse (!) 59   Temp 97.9 F (36.6 C) (Oral)   Resp 16   SpO2 98%   CT completed. Discussed case with Dr. Leonel Ramsay. Recommends urgent follow-up with Neurology for consideration of EMG. This cannot be done as inpatient.  Spoke with case manager who has scheduled patient an appointment with his PCP in 2 days. I have completed electronic neurology referral here.   Patient and wife in agreement with plan. They will  follow-up with PCP in 2 days. They will follow-up on neurology referral.  Encouraged return to the emergency department if unable to ambulate. Discussed that he will need help with stabilization using a cane or walker until this is figured out.  Final Clinical Impressions(s) / ED Diagnoses    Final diagnoses:  Weakness of proximal end of lower extremity   Patient with progressively worsening proximal lower extremity muscle weakness, now with difficulty with stabilization and ambulation. CT of the L-spine does not show any metastatic lesions. Patient unable to get an MRI due to pacemaker. He will need close PCP and neurology follow-up. No indications for admission at this time.   New Prescriptions New Prescriptions   No medications on file     Carlisle Cater, PA-C 03/03/16 1437    Carlisle Cater, PA-C 03/03/16 1556    Merrily Pew, MD 03/04/16 [phone]

## 2016-03-03 NOTE — Telephone Encounter (Signed)
Patient's wife called and said that Hiatt is not feeling well.  No CP or SOB but extreme leg weakness.  Wife wants advise as to whether he should be seen today

## 2016-03-03 NOTE — ED Notes (Signed)
Called lab to add on CK, Hepatic function panel.

## 2016-03-03 NOTE — ED Provider Notes (Signed)
Medical screening examination/treatment/procedure(s) were conducted as a shared visit with non-physician practitioner(s) and myself.  I personally evaluated the patient during the encounter.   60 year old male with multiple months of progressively worsening proximal leg weakness. Has a couple falls during that time as the point where he is not able to get up off the floor by himself. No other significant symptoms during that time. On my exam patient has 5 out of 5 strength in both lower extremities, he has 1+ deep tendon reflexes in the patella. He is able to hold both legs off the bed for longer than 10 seconds individually however he feels is more difficult to do so with his right. He has normal sensation with no saddle anesthesia. He has no back pain or tenderness. Abdominal exam is normal as well. Normal dorsal pedal pulses. No muscle wasting.  Workup was negative for any evidence of muscle breakdown to suggest muscular dystrophy. Imaging of his back is negative for any metastatic cancer that could be causing cord compression. Story not c/w types of Guillian Barre.  Discussed case with neurology who agrees that he needs further neurologic workup however they feel he can be done as an outpatient so that will be arranged. In the meantime I will order physical therapy and a walker.   Merrily Pew, MD 03/04/16 [phone]

## 2016-03-03 NOTE — Discharge Instructions (Signed)
Please read and follow all provided instructions.  Your diagnoses today include:  1. Weakness of proximal end of lower extremity     Tests performed today include:  Blood counts and electrolytes - elevated blood sugar  CT lower spine - bones look healthy  Vital signs. See below for your results today.   Medications prescribed:   None  Take any prescribed medications only as directed.  Home care instructions:  Follow any educational materials contained in this packet.  BE VERY CAREFUL not to take multiple medicines containing Tylenol (also called acetaminophen). Doing so can lead to an overdose which can damage your liver and cause liver failure and possibly death.   Follow-up instructions: Please follow-up with your primary care provider in 2 days for further evaluation of your symptoms.   I have completed an electronic referral to [facility] on your behalf. If you do not hear from them in 2 days, please call the office to schedule an appointment. You may need a test called an EMG that will test the nerve function in your legs.   Return instructions:   Please return to the Emergency Department if you experience worsening symptoms.   Return if your weakness gets worse and you are unable to walk.   Please return if you have any other emergent concerns.  Additional Information:  Your vital signs today were: BP 126/81    Pulse (!) 59    Temp 97.9 F (36.6 C) (Oral)    Resp 16    SpO2 98%  If your blood pressure (BP) was elevated above 135/85 this visit, please have this repeated by your doctor within one month. --------------

## 2016-03-03 NOTE — Telephone Encounter (Signed)
Pts wife is calling to report stroke-like symptoms the pt has been experiencing since this past Sunday.  Wife reports that the pts symptoms started Sunday afternoon, during their family pictures.  Wife states that the pt was unable to ambulate appropriately and his gait was off.  Wife states that over the course of 2 days, his gait has worsened, and pts baseline gait is always normal.  Wife states he is not slurring his speech.  Wife states he is able to answer questions appropriately.  Wife states that the pts face seems to be more swollen on one side.  Pts wife states he is completely asymptomatic from a cardiac perspective.  He has no chest pain, no sob, no pre-syncopal or syncopal episodes.  Wife states that the way he's walking is not his baseline. Wife wants to take the pt to the ER for further evaluation, but the pt refuses to go without our office recommending this.  Wife reports that the pt has had TIA's in the past. Wife states that they are in the car now, right across the road from the ER.  Advised the wife that the pt should go to the ER now, for further evaluation, and to rule out acute findings, like stroke.  Informed the pts wife that they can also do a 12-lead EKG on the pt, to make sure he's still in rhythm.  Advised the wife to take him now.  Informed the wife that I made Dr [name] aware of this, and she agreed to plan.  Wife verbalized understanding and agrees with this plan.  Wife states they will go to the ER now.

## 2016-03-03 NOTE — ED Triage Notes (Signed)
Pt reports having weakness for several days and today reports that "his legs gave out on him". Denies cp, sob or fever. No acute distress noted at triage.

## 2016-03-04 ENCOUNTER — Encounter: Payer: Self-pay | Admitting: Cardiology

## 2016-03-05 ENCOUNTER — Ambulatory Visit (INDEPENDENT_AMBULATORY_CARE_PROVIDER_SITE_OTHER): Payer: PPO | Admitting: Family

## 2016-03-05 ENCOUNTER — Encounter: Payer: Self-pay | Admitting: Neurology

## 2016-03-05 ENCOUNTER — Ambulatory Visit (INDEPENDENT_AMBULATORY_CARE_PROVIDER_SITE_OTHER): Payer: PPO | Admitting: Neurology

## 2016-03-05 ENCOUNTER — Encounter: Payer: Self-pay | Admitting: Family

## 2016-03-05 VITALS — BP 118/74 | HR 60 | Wt 199.4 lb

## 2016-03-05 DIAGNOSIS — Z950 Presence of cardiac pacemaker: Secondary | ICD-10-CM

## 2016-03-05 DIAGNOSIS — Z954 Presence of other heart-valve replacement: Secondary | ICD-10-CM

## 2016-03-05 DIAGNOSIS — I2583 Coronary atherosclerosis due to lipid rich plaque: Secondary | ICD-10-CM | POA: Diagnosis not present

## 2016-03-05 DIAGNOSIS — R29898 Other symptoms and signs involving the musculoskeletal system: Secondary | ICD-10-CM | POA: Diagnosis not present

## 2016-03-05 DIAGNOSIS — Z7901 Long term (current) use of anticoagulants: Secondary | ICD-10-CM

## 2016-03-05 DIAGNOSIS — I2510 Atherosclerotic heart disease of native coronary artery without angina pectoris: Secondary | ICD-10-CM

## 2016-03-05 DIAGNOSIS — F32A Depression, unspecified: Secondary | ICD-10-CM

## 2016-03-05 DIAGNOSIS — F411 Generalized anxiety disorder: Secondary | ICD-10-CM | POA: Diagnosis not present

## 2016-03-05 DIAGNOSIS — F329 Major depressive disorder, single episode, unspecified: Secondary | ICD-10-CM

## 2016-03-05 MED ORDER — SERTRALINE HCL 50 MG PO TABS: ORAL_TABLET | ORAL | Status: DC

## 2016-03-05 NOTE — Progress Notes (Signed)
NEUROLOGY CLINIC NEW PATIENT NOTE  NAME: [name] DOB: Jan 11, 1956 REFERRING PHYSICIAN: [place] Circle, FNP  I saw [name] as a new consult in the neurovascular clinic today regarding  Chief Complaint  Patient presents with  . New Patient (Initial Visit)    Referral from [place], PA-C, for bilaterall legs in weakness  .  HPI: Kevin English is a 60 y.o. male with PMH of afib s/p MAZE and MVR with mechanical valve in 04/2014 on Coumadin then developed CHB s/p pacer, CHF, HLD, CKD, HTN, OSA on cpap, smoker, renal dell carcinoma s/p left nephrectomy in 08/2013 without recurrence, lung nodules in RLL likely scarring in the bases and negative for metastatic disease who presents as a new patient for Bilateral leg weakness.   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 Found to have A. Fib and kidney cancer in 2015. Had left nephrectomy in 08/2013, and MAZE and MVR with mechanical valve in 04/2014. After the surgery, he was discharged to rehabilitation where initially he felt getting better, but later he developed plantar fasciitis at bottom on the right foot with sharp pain and was treated with cortisone injections 3 times. He could not walk at all or exercises at that time. Although the right foot pain gradually getting better after treatment, he lost his motivation for exercises and his strength of arms and legs. Patient describes himself as "couch potato". He gradually become weak all over as she can feel. 2 days ago, he was with friends at [facility] and slipped from toilet to the floor and he could not get up by himself. Friends called his wife and he was sent to ED for evaluation. TSH was normal and CK slightly low. CT lumbar spine unremarkable. He was discharged with urgent neuro referral.  He stated that for the last 6 months, he feels his walking gradually getting worse, feels heavy on the legs, however the feeding  comes and goes, not all the time. Wife stated sometime he shuffling on his feet. Sometimes leaning forward if standing for longer period time. Leg weakness limited his activities at outside, taking times to walk, however, he does not need any walker or cane. He denies any pain in the legs, no weakness or pain in arms, no fall since surgery in 2 to days ago.  On further questioning, patient seems to have profound depression since retirement. He still misses his work as a Engineer, structural, and he has no motivation to rehabilitation, exercise, and going outside. He lost interest to the activities he was interested before. He has been on Zoloft 83m for long time without change of dose. He felt helpful from Zoloft in the past. He had suicidal thoughts in the past, but currently denies any SI/HI.  He is on Coumadin and his INR level was pretty stable at the goal between 2.5-3.5. No recent Coumadin dosage changes. His last INR 2 days ago was 1.95. He was heavy smoker, still not quit yet, but he was trying, and has been cutting down his smoking. Currently he has a nicotine patch.  He had of "TIA" episode in 1998 when he was at his desk, felt sweaty in the neck, weak, and laid his head down for a while. Then he walked outside for smoking, and then he collapsed to the ground. He was hospitalized and was found to have bradycardia. Not sure why he was diagnosed with "TIA".  Past Medical History:  Diagnosis Date  . Anxiety   . Borderline diabetes   . CAD (coronary artery disease)    a. By cath 08/2013: 70% distal cx-prox LPDA; tandem mod LAD lesions, o/w mild dz.  . CKD (chronic kidney disease), stage II    his creat. runs 2-2.5; hasnt seen neph yet - sees dr. Tresa Moore at [facility]  . Complete heart block (HCC)    a. s/p PPM   . Deafness in left ear   . Depression   . Diverticulitis of colon 15 years ago  . Hernia   . HTN (hypertension)   . Hx of echocardiogram    post MVR >> Echo 4/16:  Mild LVH, EF 45-50%,  apical dyskinesis, trivial AI, mechanical MVR functioning appropriately, severe LAE  . Hyperlipidemia   . Hypogonadism male   . Hypothyroidism   . Lung nodule seen on imaging study 04/10/2014   5 mm nodule right lower lobe  . Mitral stenosis    a. Dx 07/2013 - through workup of 2D echo, TEE, and cath, felt to be moderate.  . Obesity   . OSA on CPAP   . Persistent atrial fibrillation ([place])    a. sustained 160-180 on e-CARDIO monitor placed 07/29/2013. Placed on IV amiodarone at end of May 2015 due to continued paroxysms with RVR.  Marland Kitchen Renal cell carcinoma of right kidney ([place]) 09/05/2013   clear cell renal cell carcinoma of right kidney treated by radical nephrectomy, Fuhrman grade 2-3, with maximum tumor diameter 2.7 cm, all tumor to find confined to the kidney, and all surgical margins negative (T1aN0).   . Renal mass    a. Dx 08/2013: concerning for renal cancer.  . Rheumatic heart disease   . S/P Minimally invasive maze operation for atrial fibrillation 05/15/2014   Left side lesion set using cryothermy with clipping of LA appendage  . S/P minimally invasive mitral valve replacement with metallic valve and maze procedure 05/15/2014   52m Sorin Carbomedics Optiform mechanical valve placed via right mini thoracotomy approach  . Stroke ([facility] 1998   "MRI showed 3 mild strokes"   Past Surgical History:  Procedure Laterality Date  . CARDIAC CATHETERIZATION  08/28/13  . EYE SURGERY Right 1990   "reconstructive, lens implant- from paintball accident"  . INGUINAL HERNIA REPAIR Bilateral first one in 80's   "I had one side done twice"  . LEFT AND RIGHT HEART CATHETERIZATION WITH CORONARY ANGIOGRAM N/A 08/28/2013   Procedure: LEFT AND RIGHT HEART CATHETERIZATION WITH CORONARY ANGIOGRAM;  Surgeon: DLeonie Man MD;  [facility];  Service: Cardiovascular;  Laterality: N/A;  . MINIMALLY INVASIVE MAZE PROCEDURE N/A 05/15/2014   Procedure: MINIMALLY INVASIVE MAZE PROCEDURE;  Surgeon: CRexene Alberts MD;  [facility]  Service: Open Heart Surgery;  Laterality: N/A;  . MITRAL VALVE REPLACEMENT Right 05/15/2014   Procedure: MINIMALLY INVASIVE MITRAL VALVE (MV) REPLACEMENT;  Surgeon: CRexene Alberts MD;  [facility]  Service: Open Heart Surgery;  Laterality: Right;  . PERMANENT PACEMAKER INSERTION Left 05/22/2014   Procedure: PERMANENT PACEMAKER INSERTION;  Surgeon: JThompson Grayer MD;  [facility];  Service: Cardiovascular;  Laterality: Left;  . ROBOT ASSISTED LAPAROSCOPIC NEPHRECTOMY Right 09/05/2013   Procedure: ROBOTIC ASSISTED LAPAROSCOPIC RIGHT RADICAL NEPHRECTOMY;  Surgeon: DSharyn Creamer MD;  Location: [facility];  Service: Urology;  Laterality: Right;  . TEE WITHOUT CARDIOVERSION N/A 08/16/2013   Procedure: TRANSESOPHAGEAL ECHOCARDIOGRAM (TEE);  Surgeon: KDorothy Spark MD;  Location:  [facility];  Service: Cardiovascular;  Laterality: N/A;  . TEE WITHOUT CARDIOVERSION N/A 05/15/2014   Procedure: TRANSESOPHAGEAL ECHOCARDIOGRAM (TEE);  Surgeon: Rexene Alberts, MD;  Location: [facility];  Service: Open Heart Surgery;  Laterality: N/A;   Family History  Problem Relation Age of Onset  . Hypertension Mother   . Heart attack Mother   . Stroke Mother   . Hypertension     Current Outpatient Prescriptions  Medication Sig Dispense Refill  . acetaminophen (TYLENOL) 325 MG tablet Take 325 mg by mouth every 6 (six) hours as needed (pain).     Marland Kitchen ALPRAZolam (XANAX) 0.5 MG tablet Take 1-2 tablets (0.5-1 mg total) by mouth at bedtime as needed for anxiety or sleep. 90 tablet 2  . amLODipine (NORVASC) 10 MG tablet Take 1 tablet (10 mg total) by mouth daily. 90 tablet 3  . aspirin EC 81 MG tablet Take 1 tablet (81 mg total) by mouth daily.    . fluticasone (FLONASE) 50 MCG/ACT nasal spray Place 2 sprays into both nostrils daily.  2  . levothyroxine (SYNTHROID, LEVOTHROID) 75 MCG tablet TAKE 1 TABLET EVERY DAY BEFORE BREAKFAST 90 tablet 1  . loratadine (CLARITIN) 10 MG tablet Take  1 tablet (10 mg total) by mouth daily.    . metoprolol (LOPRESSOR) 50 MG tablet Take 1.5 tablets (75 mg total) by mouth 2 (two) times daily. 270 tablet 3  . omega-3 acid ethyl esters (LOVAZA) 1 g capsule TAKE 2 CAPSULES TWICE A DAY 360 capsule 2  . sertraline (ZOLOFT) 50 MG tablet TAKE 1 TABLET (50 MG TOTAL) BY MOUTH DAILY.    Marland Kitchen Tiotropium Bromide-Olodaterol (STIOLTO RESPIMAT) 2.5-2.5 MCG/ACT AERS Inhale 2 puffs into the lungs daily. 4 g 4  . warfarin (COUMADIN) 5 MG tablet Take As directed by Anticoagulation Clinic (Patient taking differently: Take As directed by Anticoagulation Clinic) 40 tablet 3   No current facility-administered medications for this visit.    Allergies  Allergen Reactions  . Contrast Media [Iodinated Diagnostic Agents] Hives and Other (See Comments)    Patient started sneezing and coughing, patient broke out in hives, patient needs 13 hour prep if having IV contrast  . Ioxaglate Hives and Rash    Patient started sneezing and coughing, patient broke out in hives, patient needs 13 hour prep if having IV contrast  . Atorvastatin Other (See Comments)    Myalgias  . Crestor [Rosuvastatin] Other (See Comments)    Myalgias  . Fenofibrate Other (See Comments)    MYALGIAS   . Pravastatin Other (See Comments)    Myalgias    Social History   Social History  . Marital status: Married    Spouse name: N/A  . Number of children: 2  . Years of education: 16   Occupational History  . Retired     Social History Main Topics  . Smoking status: Former Smoker    Packs/day: 0.25    Years: 40.00    Types: Cigarettes, Cigars    Quit date: 03/02/2016  . Smokeless tobacco: Never Used     Comment: Cigar every now and then  . Alcohol use No  . Drug use: No  . Sexual activity: Not Currently   Other Topics Concern  . Not on file   Social History Narrative   Fun: [employer] people.     Review of Systems Full 14 system review of systems performed and notable only for  those listed, all others are neg:  Constitutional:  Fatigue Cardiovascular:  Ear/Nose/Throat:  Hearing loss Skin: Birthmarks Eyes:   Respiratory:  Cough, snoring Gastroitestinal:   Genitourinary: Impotence Hematology/Lymphatic:   Endocrine:  Musculoskeletal:   Allergy/Immunology:  Allergies, runny nose Neurological:  Weakness, dizziness Psychiatric: Depression, anxiety, decreased energy, disinterest in activities, suicidal thoughts Sleep: Snoring, restless legs   Physical Exam  Vitals:   03/05/16 1545  BP: 118/74  Pulse: 60    General - Well nourished, well developed, in no apparent distress, but in depressed mood.  Ophthalmologic - Sharp disc margins OU.  Cardiovascular - Regular rate and rhythm.   Neck - supple, no nuchal rigidity .  Mental Status -  Level of arousal and orientation to time, place, and person were intact. Language including expression, naming, repetition, comprehension, reading, and writing was assessed and found intact. Fund of Knowledge was assessed and was intact.  Cranial Nerves II - XII - II - Visual field intact OU. III, IV, VI - Extraocular movements intact. V - Facial sensation intact bilaterally. VII - Facial movement intact bilaterally. VIII - Hearing & vestibular intact bilaterally. X - Palate elevates symmetrically. XI - Chin turning & shoulder shrug intact bilaterally. XII - Tongue protrusion intact.  Motor Strength - The patient's strength was normal in BUE and pronator drift was absent. BLE exam showed bilateral giveaway weakness at iliopsoas, and knee flexion, inconsistent with knee extension and normal PF/DF. Bulk was normal and fasciculations were absent.   Motor Tone - Muscle tone was assessed at the neck and appendages and was normal.  Reflexes - The patient's reflexes were 2+ in all extremities and he had no pathological reflexes.  Sensory - Light touch, temperature/pinprick were assessed and were normal.    Coordination  - The patient had normal movements in the hands and feet with no ataxia or dysmetria.  Tremor was absent.  Gait and Station - normal walking, able to to toe and heel walking, mild imbalance on tandem gait, no fall with Romberg testing although mild teetering.   Imaging  I have personally reviewed the radiological images below and agree with the radiology interpretations.  Ct Lumbar Spine Wo Contrast 03/03/2016 IMPRESSION: No acute or significant bony abnormality.   Lab Review  Component     Latest Ref Rng & Units 01/27/2016 03/03/2016  Total Protein     6.5 - 8.1 g/dL  6.7  Albumin     3.5 - 5.0 g/dL  3.6  AST     15 - 41 U/L  30  ALT     17 - 63 U/L  31  Alkaline Phosphatase     38 - 126 U/L  70  Total Bilirubin     0.3 - 1.2 mg/dL  0.6  Bilirubin, Direct     0.1 - 0.5 mg/dL  0.2  Indirect Bilirubin     0.3 - 0.9 mg/dL  0.4  TSH     0.35 - 4.50 uIU/mL 0.91   CK Total     49 - 397 U/L  44 (L)    Assessment and Plan:   In summary, Kevin English is a 60 y.o. male with PMH of afib s/p MAZE and MVR with mechanical valve in 04/2014 on Coumadin then developed CHB s/p pacer, CHF, HLD, CKD, HTN, OSA on cpap, smoker, renal dell carcinoma s/p left nephrectomy in 08/2013 without recurrence, lung nodules in RLL likely scarring in the bases and negative for metastatic disease who presents as a new patient for Bilateral leg weakness with recent fall and  not able to get up. However, detailed questioning indicate profound depression since heart surgery and retirement. Exam unremarkable except b/l LE giveaway weakness.  His condition most likely related to depression, but will need to rule out myositis, neuropathy or MG. He needs further PT, psychology consult and self exercise and adjustment. Increase zoloft to 157m  - continue coumadin and ASA for afib and s/p mechanical valve - will check aldolase, AchAbs, ESR and CRP - will do EMG/NCS to rule out muscle or never disorders - refer to  outpt PT and psychology counseling  - home exercise - increase zoloft to 101mdaily for depression - encourage more activities, doing things more interested - follow up coumadin clinic and INR 2.5-3.5 - follow up with cardiology regularly - Follow up with your primary care physician for stroke risk factor modification. Recommend maintain blood pressure goal <130/80, diabetes with hemoglobin A1c goal below 7.0% and lipids with LDL cholesterol goal below 70 mg/dL.  - follow up in 2 months.   A total of 60 minutes was spent face-to-face with this patient. Over half this time was spent on counseling patient on the leg weakness diagnosis and different diagnostic and therapeutic options available. I had long discussion with pt and his wife for depression counseling.  Thank you very much for the opportunity to participate in the care of this patient.  Please do not hesitate to call if any questions or concerns arise.  Orders Placed This Encounter  Procedures  . Aldolase  . Sedimentation rate  . C-reactive protein  . Acetylcholine Receptor, Modulating  . Acetylcholine receptor, blocking Abs  . Acetylcholine Receptor, Binding  . Ambulatory referral to Psychology    Referral Priority:   Routine    Referral Type:   Psychiatric    Referral Reason:   Specialty Services Required    Requested Specialty:   Psychology    Number of Visits Requested:   1  . Ambulatory referral to Physical Therapy    Referral Priority:   Routine    Referral Type:   Physical Medicine    Referral Reason:   Specialty Services Required    Requested Specialty:   Physical Therapy    Number of Visits Requested:   1  . NCV with EMG(electromyography)    Standing Status:   Future    Standing Expiration Date:   03/05/2017    Order Specific Question:   Where should this test be performed?    Answer:   [facility]    Meds ordered this encounter  Medications  . sertraline (ZOLOFT) 50 MG tablet    Sig: TAKE 1 TABLET (50 MG TOTAL)  BY MOUTH DAILY.    Patient Instructions  - continue coumadin and ASA for mechanical  - will do blood draw to rule out other diseases - will do EMG/NCS to rule out muscle or never disorders - refer to outpt PT and psychology counseling  - home exercise - increase zoloft to 10029maily for depression - encourage more activities, doing things more interested - follow up coumadin clinic and INR 2.5-3.5 - follow up with cardiology regularly - Follow up with your primary care physician for stroke risk factor modification. Recommend maintain blood pressure goal <130/80, diabetes with hemoglobin A1c goal below 7.0% and lipids with LDL cholesterol goal below 70 mg/dL.  - follow up in 2 months.    JinRosalin HawkingD PhD [facility] [address]uiPrentisseDesert HillsC [id number] [phone]

## 2016-03-05 NOTE — Patient Instructions (Addendum)
Thank you for choosing [facility].  SUMMARY AND INSTRUCTIONS:  Medication:  Keep taking your medications as prescribed.  Your prescription(s) have been submitted to your pharmacy or been printed and provided for you. Please take as directed and contact our office if you believe you are having problem(s) with the medication(s) or have any questions.  Follow up:  If your symptoms worsen or fail to improve, please contact our office for further instruction, or in case of emergency go directly to the emergency room at the closest medical facility.    Bupropion sustained-release tablets (smoking cessation) What is this medicine? BUPROPION (byoo PROE pee on) is used to help people quit smoking. This medicine may be used for other purposes; ask your health care provider or pharmacist if you have questions. COMMON BRAND NAME(S): Buproban, Zyban What should I tell my health care provider before I take this medicine? They need to know if you have any of these conditions: -an eating disorder, such as anorexia or bulimia -bipolar disorder or psychosis -diabetes or high blood sugar, treated with medication -glaucoma -head injury or brain tumor -heart disease, previous heart attack, or irregular heart beat -high blood pressure -kidney or liver disease -seizures -suicidal thoughts or a previous suicide attempt -Tourette's syndrome -weight loss -an unusual or allergic reaction to bupropion, other medicines, foods, dyes, or preservatives -breast-feeding -pregnant or trying to become pregnant How should I use this medicine? Take this medicine by mouth with a glass of water. Follow the directions on the prescription label. You can take it with or without food. If it upsets your stomach, take it with food. Do not cut, crush or chew this medicine. Take your medicine at regular intervals. If you take this medicine more than once a day, take your second dose at least 8 hours after you take  your first dose. To limit difficulty in sleeping, avoid taking this medicine at bedtime. Do not take your medicine more often than directed. Do not stop taking this medicine suddenly except upon the advice of your doctor. Stopping this medicine too quickly may cause serious side effects. A special MedGuide will be given to you by the pharmacist with each prescription and refill. Be sure to read this information carefully each time. Talk to your pediatrician regarding the use of this medicine in children. Special care may be needed. Overdosage: If you think you have taken too much of this medicine contact a poison control center or emergency room at once. NOTE: This medicine is only for you. Do not share this medicine with others. What if I miss a dose? If you miss a dose, skip the missed dose and take your next tablet at the regular time. There should be at least 8 hours between doses. Do not take double or extra doses. What may interact with this medicine? Do not take this medicine with any of the following medications: -linezolid -MAOIs like Azilect, Carbex, Eldepryl, Marplan, Nardil, and Parnate -methylene blue (injected into a vein) -other medicines that contain bupropion like Wellbutrin This medicine may also interact with the following medications: -alcohol -certain medicines for anxiety or sleep -certain medicines for blood pressure like metoprolol, propranolol -certain medicines for depression or psychotic disturbances -certain medicines for HIV or AIDS like efavirenz, lopinavir, nelfinavir, ritonavir -certain medicines for irregular heart beat like propafenone, flecainide -certain medicines for Parkinson's disease like amantadine, levodopa -certain medicines for seizures like carbamazepine, phenytoin, phenobarbital -cimetidine -clopidogrel -cyclophosphamide -digoxin -furazolidone -isoniazid -nicotine -orphenadrine -procarbazine -steroid medicines like prednisone or  cortisone -stimulant medicines for attention disorders, weight loss, or to stay awake -tamoxifen -theophylline -thiotepa -ticlopidine -tramadol -warfarin This list may not describe all possible interactions. Give your health care provider a list of all the medicines, herbs, non-prescription drugs, or dietary supplements you use. Also tell them if you smoke, drink alcohol, or use illegal drugs. Some items may interact with your medicine. What should I watch for while using this medicine? Visit your doctor or health care professional for regular checks on your progress. This medicine should be used together with a patient support program. It is important to participate in a behavioral program, counseling, or other support program that is recommended by your health care professional. Patients and their families should watch out for new or worsening thoughts of suicide or depression. Also watch out for sudden changes in feelings such as feeling anxious, agitated, panicky, irritable, hostile, aggressive, impulsive, severely restless, overly excited and hyperactive, or not being able to sleep. If this happens, especially at the beginning of treatment or after a change in dose, call your health care professional. Avoid alcoholic drinks while taking this medicine. Drinking excessive alcoholic beverages, using sleeping or anxiety medicines, or quickly stopping the use of these agents while taking this medicine may increase your risk for a seizure. Do not drive or use heavy machinery until you know how this medicine affects you. This medicine can impair your ability to perform these tasks. Do not take this medicine close to bedtime. It may prevent you from sleeping. Your mouth may get dry. Chewing sugarless gum or sucking hard candy, and drinking plenty of water may help. Contact your doctor if the problem does not go away or is severe. Do not use nicotine patches or chewing gum without the advice of your  doctor or health care professional while taking this medicine. You may need to have your blood pressure taken regularly if your doctor recommends that you use both nicotine and this medicine together. What side effects may I notice from receiving this medicine? Side effects that you should report to your doctor or health care professional as soon as possible: -allergic reactions like skin rash, itching or hives, swelling of the face, lips, or tongue -breathing problems -changes in vision -confusion -elevated mood, decreased need for sleep, racing thoughts, impulsive behavior -fast or irregular heartbeat -hallucinations, loss of contact with reality -increased blood pressure -redness, blistering, peeling or loosening of the skin, including inside the mouth -seizures -suicidal thoughts or other mood changes -unusually weak or tired -vomiting Side effects that usually do not require medical attention (report to your doctor or health care professional if they continue or are bothersome): -constipation -headache -loss of appetite -nausea -tremors -weight loss This list may not describe all possible side effects. Call your doctor for medical advice about side effects. You may report side effects to FDA at 1-800-FDA-1088. Where should I keep my medicine? Keep out of the reach of children. Store at room temperature between 20 and 25 degrees C (68 and 77 degrees F). Protect from light. Keep container tightly closed. Throw away any unused medicine after the expiration date. NOTE: This sheet is a summary. It may not cover all possible information. If you have questions about this medicine, talk to your doctor, pharmacist, or health care provider.  2017 Elsevier/Gold Standard (2015-08-30 13:49:28)

## 2016-03-05 NOTE — Patient Instructions (Signed)
-   continue coumadin and ASA for mechanical  - will do blood draw to rule out other diseases - will do EMG/NCS to rule out muscle or never disorders - refer to outpt PT and psychology counseling  - home exercise - increase zoloft to 100mg  daily for depression - encourage more activities, doing things more interested - follow up coumadin clinic and INR 2.5-3.5 - follow up with cardiology regularly - Follow up with your primary care physician for stroke risk factor modification. Recommend maintain blood pressure goal <130/80, diabetes with hemoglobin A1c goal below 7.0% and lipids with LDL cholesterol goal below 70 mg/dL.  - follow up in 2 months.

## 2016-03-05 NOTE — Assessment & Plan Note (Signed)
Lower extremity weakness with concern for possible neurological origin including possible parkinson's disease. Has follow up with neurology. Recommend to continue medications as prescribed and continue to monitor symptoms with additional treatment pending neurology appointment.

## 2016-03-05 NOTE — Progress Notes (Signed)
Subjective:    Patient ID: Kevin English, male    DOB: [birth year], 60 y.o.   MRN: [id number]  Chief Complaint  Patient presents with  . Hospitalization Follow-up    has an appointment with neurology for the weakness he has been having in his legs    HPI:  Kevin English is a 60 y.o. male who  has a past medical history of Anxiety; Borderline diabetes; CAD (coronary artery disease); CKD (chronic kidney disease), stage II; Complete heart block ([place]); Deafness in left ear; Depression; Diverticulitis of colon (15 years ago); Hernia; HTN (hypertension); echocardiogram; Hyperlipidemia; Hypogonadism male; Hypothyroidism; Lung nodule seen on imaging study (04/10/2014); Mitral stenosis; Obesity; OSA on CPAP; Persistent atrial fibrillation ([place]); Renal cell carcinoma of right kidney ([place]) (09/05/2013); Renal mass; Rheumatic heart disease; S/P Minimally invasive maze operation for atrial fibrillation (05/15/2014); S/P minimally invasive mitral valve replacement with metallic valve and maze procedure (05/15/2014); and Stroke ([place]) (1998). and presents today for a follow up office visit.    Since leaving the emergency department he does feel some improvement in his lower extremity symptoms. Describes symptoms of leg weakness that have been going on for approximately 3 months. Weakness is described as soreness and feeling heavy from time to time. Notes he is unable to stand for long periods of tim encluding during a recent family picture where he had to rely on support to stand. There is decreased balance. Patients wife describes that when he walks he does appear to walk with a shuffle on occasion. Denies back pain, numbness or tingling, or trauma that he can recall. No tremors or syncope. CT scan completed in the ED was negative for any significant findings.    Allergies  Allergen Reactions  . Contrast Media [Iodinated Diagnostic Agents] Hives and Other (See Comments)    Patient started sneezing and  coughing, patient broke out in hives, patient needs 13 hour prep if having IV contrast  . Ioxaglate Hives and Rash    Patient started sneezing and coughing, patient broke out in hives, patient needs 13 hour prep if having IV contrast  . Atorvastatin Other (See Comments)    Myalgias  . Crestor [Rosuvastatin] Other (See Comments)    Myalgias  . Fenofibrate Other (See Comments)    MYALGIAS   . Pravastatin Other (See Comments)    Myalgias       Outpatient Medications Prior to Visit  Medication Sig Dispense Refill  . acetaminophen (TYLENOL) 325 MG tablet Take 325 mg by mouth every 6 (six) hours as needed (pain).     Marland Kitchen ALPRAZolam (XANAX) 0.5 MG tablet Take 1-2 tablets (0.5-1 mg total) by mouth at bedtime as needed for anxiety or sleep. 90 tablet 2  . amLODipine (NORVASC) 10 MG tablet Take 1 tablet (10 mg total) by mouth daily. 90 tablet 3  . aspirin EC 81 MG tablet Take 1 tablet (81 mg total) by mouth daily.    . fluticasone (FLONASE) 50 MCG/ACT nasal spray Place 2 sprays into both nostrils daily.  2  . levothyroxine (SYNTHROID, LEVOTHROID) 75 MCG tablet TAKE 1 TABLET EVERY DAY BEFORE BREAKFAST 90 tablet 1  . loratadine (CLARITIN) 10 MG tablet Take 1 tablet (10 mg total) by mouth daily.    . metoprolol (LOPRESSOR) 50 MG tablet Take 1.5 tablets (75 mg total) by mouth 2 (two) times daily. 270 tablet 3  . omega-3 acid ethyl esters (LOVAZA) 1 g capsule TAKE 2 CAPSULES TWICE A DAY 360 capsule 2  .  Tiotropium Bromide-Olodaterol (STIOLTO RESPIMAT) 2.5-2.5 MCG/ACT AERS Inhale 2 puffs into the lungs daily. 4 g 4  . warfarin (COUMADIN) 5 MG tablet Take As directed by Anticoagulation Clinic (Patient taking differently: Take As directed by Anticoagulation Clinic) 40 tablet 3  . sertraline (ZOLOFT) 50 MG tablet TAKE 1 TABLET (50 MG TOTAL) BY MOUTH DAILY. 90 tablet 0   No facility-administered medications prior to visit.       Past Surgical History:  Procedure Laterality Date  . CARDIAC  CATHETERIZATION  08/28/13  . EYE SURGERY Right 1990   "reconstructive, lens implant- from paintball accident"  . INGUINAL HERNIA REPAIR Bilateral first one in 80's   "I had one side done twice"  . LEFT AND RIGHT HEART CATHETERIZATION WITH CORONARY ANGIOGRAM N/A 08/28/2013   Procedure: LEFT AND RIGHT HEART CATHETERIZATION WITH CORONARY ANGIOGRAM;  Surgeon: Leonie Man, MD;  Location: [facility] CATH LAB;  Service: Cardiovascular;  Laterality: N/A;  . MINIMALLY INVASIVE MAZE PROCEDURE N/A 05/15/2014   Procedure: MINIMALLY INVASIVE MAZE PROCEDURE;  Surgeon: Rexene Alberts, MD;  Location: [place];  Service: Open Heart Surgery;  Laterality: N/A;  . MITRAL VALVE REPLACEMENT Right 05/15/2014   Procedure: MINIMALLY INVASIVE MITRAL VALVE (MV) REPLACEMENT;  Surgeon: Rexene Alberts, MD;  Location: [place];  Service: Open Heart Surgery;  Laterality: Right;  . PERMANENT PACEMAKER INSERTION Left 05/22/2014   Procedure: PERMANENT PACEMAKER INSERTION;  Surgeon: Thompson Grayer, MD;  Location: [facility] CATH LAB;  Service: Cardiovascular;  Laterality: Left;  . ROBOT ASSISTED LAPAROSCOPIC NEPHRECTOMY Right 09/05/2013   Procedure: ROBOTIC ASSISTED LAPAROSCOPIC RIGHT RADICAL NEPHRECTOMY;  Surgeon: Sharyn Creamer, MD;  [facility];  Service: Urology;  Laterality: Right;  . TEE WITHOUT CARDIOVERSION N/A 08/16/2013   Procedure: TRANSESOPHAGEAL ECHOCARDIOGRAM (TEE);  Surgeon: Dorothy Spark, MD;  Location: [place];  Service: Cardiovascular;  Laterality: N/A;  . TEE WITHOUT CARDIOVERSION N/A 05/15/2014   Procedure: TRANSESOPHAGEAL ECHOCARDIOGRAM (TEE);  Surgeon: Rexene Alberts, MD;  Location: [place];  Service: Open Heart Surgery;  Laterality: N/A;      Past Medical History:  Diagnosis Date  . Anxiety   . Borderline diabetes   . CAD (coronary artery disease)    a. By cath 08/2013: 70% distal cx-prox LPDA; tandem mod LAD lesions, o/w mild dz.  . CKD (chronic kidney disease), stage II    his creat. runs 2-2.5; hasnt seen neph  yet - sees dr. Tresa Moore at [facility]  . Complete heart block (HCC)    a. s/p PPM   . Deafness in left ear   . Depression   . Diverticulitis of colon 15 years ago  . Hernia   . HTN (hypertension)   . Hx of echocardiogram    post MVR >> Echo 4/16:  Mild LVH, EF 45-50%, apical dyskinesis, trivial AI, mechanical MVR functioning appropriately, severe LAE  . Hyperlipidemia   . Hypogonadism male   . Hypothyroidism   . Lung nodule seen on imaging study 04/10/2014   5 mm nodule right lower lobe  . Mitral stenosis    a. Dx 07/2013 - through workup of 2D echo, TEE, and cath, felt to be moderate.  . Obesity   . OSA on CPAP   . Persistent atrial fibrillation ([place])    a. sustained 160-180 on e-CARDIO monitor placed 07/29/2013. Placed on IV amiodarone at end of May 2015 due to continued paroxysms with RVR.  Marland Kitchen Renal cell carcinoma of right kidney ([place]) 09/05/2013   clear cell renal cell  carcinoma of right kidney treated by radical nephrectomy, Fuhrman grade 2-3, with maximum tumor diameter 2.7 cm, all tumor to find confined to the kidney, and all surgical margins negative (T1aN0).   . Renal mass    a. Dx 08/2013: concerning for renal cancer.  . Rheumatic heart disease   . S/P Minimally invasive maze operation for atrial fibrillation 05/15/2014   Left side lesion set using cryothermy with clipping of LA appendage  . S/P minimally invasive mitral valve replacement with metallic valve and maze procedure 05/15/2014   38mm Sorin Carbomedics Optiform mechanical valve placed via right mini thoracotomy approach  . Stroke [facility]) 1998   "MRI showed 3 mild strokes"      Review of Systems  Constitutional: Negative for chills and fever.  Respiratory: Negative for chest tightness and shortness of breath.   Neurological: Positive for weakness. Negative for dizziness, numbness and headaches.      Objective:    BP (!) 146/96 (BP Location: Left Arm, Patient Position: Sitting, Cuff Size: Large)   Pulse (!) 59   Temp  97.8 F (36.6 C) (Oral)   Resp 16   Ht 5\' 8"  (1.727 m)   Wt 198 lb (89.8 kg)   SpO2 96%   BMI 30.11 kg/m  Nursing note and vital signs reviewed.  Physical Exam  Constitutional: He is oriented to person, place, and time. He appears well-developed and well-nourished. No distress.  Cardiovascular: Normal rate, regular rhythm, normal heart sounds and intact distal pulses.   Pulmonary/Chest: Effort normal and breath sounds normal.  Musculoskeletal:  Bilateral lower extremities - No obvious deformity, discoloration or edema. There is generalized weakness in all motions compared to the contralateral side. There does appear to be a cog wheel type motion of hip flexors noted. Distal pulses and sensation are intact and appropriate.   Neurological: He is alert and oriented to person, place, and time.  Skin: Skin is warm and dry.  Psychiatric: He has a normal mood and affect. His behavior is normal. Judgment and thought content normal.       Assessment & Plan:   Problem List Items Addressed This Visit      Nervous and Auditory   Lower extremity weakness    Lower extremity weakness with concern for possible neurological origin including possible parkinson's disease. Has follow up with neurology. Recommend to continue medications as prescribed and continue to monitor symptoms with additional treatment pending neurology appointment.           I am having Mr. Meise maintain his acetaminophen, aspirin EC, amLODipine, metoprolol, fluticasone, loratadine, levothyroxine, omega-3 acid ethyl esters, warfarin, ALPRAZolam, and Tiotropium Bromide-Olodaterol.   Follow-up: Return if symptoms worsen or fail to improve.  Mauricio Po, FNP

## 2016-03-06 ENCOUNTER — Encounter: Payer: Self-pay | Admitting: Cardiology

## 2016-03-06 ENCOUNTER — Telehealth: Payer: Self-pay | Admitting: *Deleted

## 2016-03-06 ENCOUNTER — Ambulatory Visit (INDEPENDENT_AMBULATORY_CARE_PROVIDER_SITE_OTHER): Payer: PPO | Admitting: *Deleted

## 2016-03-06 ENCOUNTER — Ambulatory Visit (INDEPENDENT_AMBULATORY_CARE_PROVIDER_SITE_OTHER): Payer: PPO | Admitting: Cardiology

## 2016-03-06 VITALS — BP 126/70 | HR 53 | Ht 68.0 in | Wt 195.0 lb

## 2016-03-06 DIAGNOSIS — I2583 Coronary atherosclerosis due to lipid rich plaque: Secondary | ICD-10-CM

## 2016-03-06 DIAGNOSIS — Z5181 Encounter for therapeutic drug level monitoring: Secondary | ICD-10-CM

## 2016-03-06 DIAGNOSIS — I10 Essential (primary) hypertension: Secondary | ICD-10-CM

## 2016-03-06 DIAGNOSIS — I5033 Acute on chronic diastolic (congestive) heart failure: Secondary | ICD-10-CM

## 2016-03-06 DIAGNOSIS — F322 Major depressive disorder, single episode, severe without psychotic features: Secondary | ICD-10-CM

## 2016-03-06 DIAGNOSIS — F329 Major depressive disorder, single episode, unspecified: Secondary | ICD-10-CM | POA: Insufficient documentation

## 2016-03-06 DIAGNOSIS — I4891 Unspecified atrial fibrillation: Secondary | ICD-10-CM

## 2016-03-06 DIAGNOSIS — E782 Mixed hyperlipidemia: Secondary | ICD-10-CM

## 2016-03-06 DIAGNOSIS — F32A Depression, unspecified: Secondary | ICD-10-CM

## 2016-03-06 DIAGNOSIS — I442 Atrioventricular block, complete: Secondary | ICD-10-CM | POA: Diagnosis not present

## 2016-03-06 DIAGNOSIS — I2510 Atherosclerotic heart disease of native coronary artery without angina pectoris: Secondary | ICD-10-CM

## 2016-03-06 DIAGNOSIS — Z954 Presence of other heart-valve replacement: Secondary | ICD-10-CM | POA: Diagnosis not present

## 2016-03-06 LAB — POCT INR: INR: 2.3

## 2016-03-06 MED ORDER — SERTRALINE HCL 50 MG PO TABS: 100.0000 mg | ORAL_TABLET | Freq: Every day | ORAL | Status: DC

## 2016-03-06 MED ORDER — METOPROLOL TARTRATE 50 MG PO TABS: 50.0000 mg | ORAL_TABLET | Freq: Two times a day (BID) | ORAL | 3 refills | Status: DC

## 2016-03-06 NOTE — Telephone Encounter (Signed)
Dr. Clovis Pu  Received: Today  Message Contents  Megan Salon McVey  Nuala Alpha, LPN        I called to schedule, they advised the patient will have to call to schedule, I gave them the phone number to call [phone]

## 2016-03-06 NOTE — Progress Notes (Signed)
Cardiology Office Note:    Date:  03/06/2016   ID:  [name], DOB 1955-11-25, MRN [id number]  PCP:  Mauricio Po, [place]  Cardiologist:  Dr. Ena Dawley   Electrophysiologist:  Dr. Thompson Grayer  Nephrologist: Dr. Marval Regal  Chief complain: Post ER visit, lower extremity weakness, depression  History of Present Illness:     Kevin English is a 60 y.o. male with a hx of HTN, HL (intol to statins), former smoker, PAFib, Rheumatic heart disease with severe mitral stenosis, diastolic HF, depression, CKD, renal cell CA, OSA. He has been on Coumadin. He had been prepared for minimally invasive MVR and placed on Amiodarone. However, he developed clear cell renal cell CA and underwent R nephrectomy. He developed fatigue with beta-blocker and Bystolic was DC'd. He was ultimately stabilized and underwent Minimally-Invasive Mitral Valve Replacement (Sorin Carbomedics Optiform bileaflet mechanical valve) and Maze Procedure with Left atrial lesion set using cryothermy and Clipping of Left Atrial Appendage with Dr. Roxy Manns in 2/16. Post op course was complicated by complete heart block. He ultimately required implantation of a Medtronic Adapta L dual-chamber pacemaker.   03/04/2016 - the patient has been dealing with progressively worsening depression, he meets with his friends but has no interest or joy in life, his children or family life. He states he doesn't care about his heart health because he doesn't feel well. On Tuesday, 03/03/2016 he was at [employer] with his friends when he walked to the bathroom and felt that he is legs were weak and went to the ground and couldn't get up. He denies any chest pain palpitation or dizziness at the time. An ambulance was called and he was taken to the ER. In the ER his labs were normal, and head CT showed no acute changes. He now denies any weakness. He was seen by neurologist yesterday who thinks that this is related to progressive severe depression  but order nerve conduction studies and electromyography study. Today he denies any chest pain and worsening shortness of breath he has been compliant with his warfarin today's INR 2.3. No bleeding. No palpitations. No lower extremity edema orthopnea or personal nocturnal dyspnea.   Past Medical History:  Diagnosis Date  . Anxiety   . Borderline diabetes   . CAD (coronary artery disease)    a. By cath 08/2013: 70% distal cx-prox LPDA; tandem mod LAD lesions, o/w mild dz.  . CKD (chronic kidney disease), stage II    his creat. runs 2-2.5; hasnt seen neph yet - sees [facility]. [facility] at [facility]  . Complete heart block (HCC)    a. s/p PPM   . Deafness in left ear   . Depression   . Diverticulitis of colon 15 years ago  . Hernia   . HTN (hypertension)   . Hx of echocardiogram    post MVR >> Echo 4/16:  Mild LVH, EF 45-50%, apical dyskinesis, trivial AI, mechanical MVR functioning appropriately, severe LAE  . Hyperlipidemia   . Hypogonadism male   . Hypothyroidism   . Lung nodule seen on imaging study 04/10/2014   5 mm nodule right lower lobe  . Mitral stenosis    a. Dx 07/2013 - through workup of 2D echo, TEE, and cath, felt to be moderate.  . Obesity   . OSA on CPAP   . Persistent atrial fibrillation ([place])    a. sustained 160-180 on e-CARDIO monitor placed 07/29/2013. Placed on IV amiodarone at end of May 2015 due to continued paroxysms with RVR.  Marland Kitchen  Renal cell carcinoma of right kidney ([place]) 09/05/2013   clear cell renal cell carcinoma of right kidney treated by radical nephrectomy, Fuhrman grade 2-3, with maximum tumor diameter 2.7 cm, all tumor to find confined to the kidney, and all surgical margins negative (T1aN0).   . Renal mass    a. Dx 08/2013: concerning for renal cancer.  . Rheumatic heart disease   . S/P Minimally invasive maze operation for atrial fibrillation 05/15/2014   Left side lesion set using cryothermy with clipping of LA appendage  . S/P minimally invasive mitral valve  replacement with metallic valve and maze procedure 05/15/2014   45mm Sorin Carbomedics Optiform mechanical valve placed via right mini thoracotomy approach  . Stroke [facility]) 1998   "MRI showed 3 mild strokes"    Past Surgical History:  Procedure Laterality Date  . CARDIAC CATHETERIZATION  08/28/13  . EYE SURGERY Right 1990   "reconstructive, lens implant- from paintball accident"  . INGUINAL HERNIA REPAIR Bilateral first one in 80's   "I had one side done twice"  . LEFT AND RIGHT HEART CATHETERIZATION WITH CORONARY ANGIOGRAM N/A 08/28/2013   Procedure: LEFT AND RIGHT HEART CATHETERIZATION WITH CORONARY ANGIOGRAM;  Surgeon: Leonie Man, MD;  Location: [facility] CATH LAB;  Service: Cardiovascular;  Laterality: N/A;  . MINIMALLY INVASIVE MAZE PROCEDURE N/A 05/15/2014   Procedure: MINIMALLY INVASIVE MAZE PROCEDURE;  Surgeon: Rexene Alberts, MD;  Location: [place];  Service: Open Heart Surgery;  Laterality: N/A;  . MITRAL VALVE REPLACEMENT Right 05/15/2014   Procedure: MINIMALLY INVASIVE MITRAL VALVE (MV) REPLACEMENT;  Surgeon: Rexene Alberts, MD;  Location: [place];  Service: Open Heart Surgery;  Laterality: Right;  . PERMANENT PACEMAKER INSERTION Left 05/22/2014   Procedure: PERMANENT PACEMAKER INSERTION;  Surgeon: Thompson Grayer, MD;  Location: [facility] CATH LAB;  Service: Cardiovascular;  Laterality: Left;  . ROBOT ASSISTED LAPAROSCOPIC NEPHRECTOMY Right 09/05/2013   Procedure: ROBOTIC ASSISTED LAPAROSCOPIC RIGHT RADICAL NEPHRECTOMY;  Surgeon: Sharyn Creamer, MD;  [facility];  Service: Urology;  Laterality: Right;  . TEE WITHOUT CARDIOVERSION N/A 08/16/2013   Procedure: TRANSESOPHAGEAL ECHOCARDIOGRAM (TEE);  Surgeon: Dorothy Spark, MD;  Location: [place];  Service: Cardiovascular;  Laterality: N/A;  . TEE WITHOUT CARDIOVERSION N/A 05/15/2014   Procedure: TRANSESOPHAGEAL ECHOCARDIOGRAM (TEE);  Surgeon: Rexene Alberts, MD;  Location: [place];  Service: Open Heart Surgery;  Laterality: N/A;    Current  Medications: Outpatient Medications Prior to Visit  Medication Sig Dispense Refill  . acetaminophen (TYLENOL) 325 MG tablet Take 325 mg by mouth every 6 (six) hours as needed (pain).     Marland Kitchen ALPRAZolam (XANAX) 0.5 MG tablet Take 1-2 tablets (0.5-1 mg total) by mouth at bedtime as needed for anxiety or sleep. 90 tablet 2  . amLODipine (NORVASC) 10 MG tablet Take 1 tablet (10 mg total) by mouth daily. 90 tablet 3  . aspirin EC 81 MG tablet Take 1 tablet (81 mg total) by mouth daily.    . fluticasone (FLONASE) 50 MCG/ACT nasal spray Place 2 sprays into both nostrils daily.  2  . levothyroxine (SYNTHROID, LEVOTHROID) 75 MCG tablet TAKE 1 TABLET EVERY DAY BEFORE BREAKFAST 90 tablet 1  . loratadine (CLARITIN) 10 MG tablet Take 1 tablet (10 mg total) by mouth daily.    Marland Kitchen omega-3 acid ethyl esters (LOVAZA) 1 g capsule TAKE 2 CAPSULES TWICE A DAY 360 capsule 2  . sertraline (ZOLOFT) 50 MG tablet Take 2 tablets (100 mg total) by mouth daily. TAKE 1  TABLET (50 MG TOTAL) BY MOUTH DAILY.    Marland Kitchen Tiotropium Bromide-Olodaterol (STIOLTO RESPIMAT) 2.5-2.5 MCG/ACT AERS Inhale 2 puffs into the lungs daily. 4 g 4  . warfarin (COUMADIN) 5 MG tablet Take As directed by Anticoagulation Clinic (Patient taking differently: Take As directed by Anticoagulation Clinic) 40 tablet 3  . metoprolol (LOPRESSOR) 50 MG tablet Take 1.5 tablets (75 mg total) by mouth 2 (two) times daily. 270 tablet 3   No facility-administered medications prior to visit.      Allergies:   Contrast media [iodinated diagnostic agents]; Ioxaglate; Atorvastatin; Crestor [rosuvastatin]; Fenofibrate; and Pravastatin   Social History   Social History  . Marital status: Married    Spouse name: N/A  . Number of children: 2  . Years of education: 16   Occupational History  . Retired     Social History Main Topics  . Smoking status: Former Smoker    Packs/day: 0.25    Years: 40.00    Types: Cigarettes, Cigars    Quit date: 03/02/2016  . Smokeless  tobacco: Never Used     Comment: Cigar every now and then  . Alcohol use No  . Drug use: No  . Sexual activity: Not Currently   Other Topics Concern  . None   Social History Narrative   Fun: [employer] people.      Family History:  The patient's family history includes Heart attack in his mother; Hypertension in his mother; Stroke in his mother.   ROS:   Please see the history of present illness.    ROS All other systems reviewed and are negative.   Physical Exam:    VS:  BP 126/70   Pulse (!) 53   Ht 5\' 8"  (1.727 m)   Wt 195 lb (88.5 kg)   BMI 29.65 kg/m    GEN: Well nourished, well developed, in no acute distress  HEENT: normal  Neck: no JVD, no masses Cardiac: mechanical S1, normal S2, RRR; no murmurs, rubs, or gallops, no edema;  Respiratory:  clear to auscultation bilaterally; no wheezing, rhonchi or rales GI: soft, nontender, nondistended MS: no deformity or atrophy  Skin: warm and dry Neuro: No focal deficits  Psych: Alert and oriented x 3, normal affect  Wt Readings from Last 3 Encounters:  03/06/16 195 lb (88.5 kg)  03/05/16 199 lb 6.4 oz (90.4 kg)  03/05/16 198 lb (89.8 kg)      Studies/Labs Reviewed:     EKG:  EKG is  ordered today.  The ekg ordered today demonstrates A paced, HR 60, LAD, IVCD, NSSTTW changes, QTc 428 ms  Recent Labs: 01/27/2016: TSH 0.91 03/03/2016: ALT 31; BUN 17; Creatinine, Ser 1.44; Hemoglobin 18.0; Platelets 224; Potassium 4.3; Sodium 138  Labs at nephrologists office 06/13/15: BUN 17, creatinine 1.50, potassium 5, ALT 25  Recent Lipid Panel    Component Value Date/Time   CHOL 159 03/19/2015 0756   TRIG 272 (H) 03/19/2015 0756   HDL 19 (L) 03/19/2015 0756   CHOLHDL 8.4 (H) 03/19/2015 0756   VLDL 54 (H) 03/19/2015 0756   LDLCALC 86 03/19/2015 0756   LDLDIRECT 62.0 12/13/2014 0845    Additional studies/ records that were reviewed today include:    Echo 7/16 Mild LVH, EF 50-55%, normal wall motion, trivial AI,  mechanical MVR with normal function, mean gradient 4 mmHg, mild to moderate LAE, normal RV function  Echo 4/16 Mild LVH, EF 45-50%, apical dyskinesis, trivial AI, mechanical MVR okay, severe LAE  Carotid US  04/16/14 Bilateral - 1% to 39% ICA stensosis.  TEE [id number] Normal systolic function, normal wall motion, mild AI, moderate LAE Impressions: Rheumatic mitral valve with moderate leaflet tip thickening and minimal calcifications. Moderate mitral stenosis and trace mitral regurgitation.  Cardiac cath 08/28/13 LM: Normal LAD: Proximal 40-50%, distal 50-60% LCx: Small OM 70-80% (not amenable to PCI or CABG), PDA 70% LPDA: 10-20% RCA: Mid 30-40%   ASSESSMENT:     1. Depression, unspecified depression type   2. Complete heart block ([place])   3. S/P mitral valve replacement with metallic valve   4. Encounter for therapeutic drug monitoring   5. Atrial fibrillation with RVR ([place])   6. Coronary artery disease due to lipid rich plaque   7. Essential hypertension   8. Mixed hyperlipidemia   9. Acute on chronic diastolic CHF (congestive heart failure), NYHA class 3 (HCC)     PLAN:     In order of problems listed above:  1. Depression - the patient is going through major depression, Zoloft doesn't seem to be helping, will refer him to psychiatrist. After long discussion patient agrees. I'm going to decrease the amount of metoprolol from 75 by mouth twice a day to 50 mg by mouth twice a day.  2. HTN - Controlled on current regimen.  3. S/p Mechanical MVR - Echo in 7/16 with normal LV function, well functioning mechanical MVR. Continue Coumadin. Continue SBE prophylaxis.  4. PAF - Maintaining NSR. 0% atrial fibrillation on pacemaker interrogation in May 2017. Continue Coumadin. Decrease dose of metoprolol as he is bradycardic.  5. HL - Continue current Rx. Statin intol.  6. CAD - No angina.  Continue ASA, beta-blocker.  7. OSA - Continue CPAP.   Medication Adjustments/Labs and  Tests Ordered: Current medicines are reviewed at length with the patient today.  Concerns regarding medicines are outlined above.  Medication changes, Labs and Tests ordered today are outlined in the Patient Instructions noted below. Patient Instructions  Medication Instructions:   DECREASE YOUR METOPROLOL TARTRATE TO 50 MG TWICE DAILY    You have been referred to [place]:  3 MONTHS WITH DR [name]       If you need a refill on your cardiac medications before your next appointment, please call your pharmacy.    Signed, Ena Dawley, MD  03/06/2016 12:33 PM    [address], [place], [place]  [address] Phone: [phone]; Fax: [phone]

## 2016-03-06 NOTE — Patient Instructions (Signed)
Medication Instructions:   DECREASE YOUR METOPROLOL TARTRATE TO 50 MG TWICE DAILY    You have been referred to [place]:  3 MONTHS WITH DR [name]       If you need a refill on your cardiac medications before your next appointment, please call your pharmacy.

## 2016-03-09 DIAGNOSIS — M6281 Muscle weakness (generalized): Secondary | ICD-10-CM | POA: Diagnosis not present

## 2016-03-09 DIAGNOSIS — R29898 Other symptoms and signs involving the musculoskeletal system: Secondary | ICD-10-CM | POA: Diagnosis not present

## 2016-03-09 DIAGNOSIS — R2681 Unsteadiness on feet: Secondary | ICD-10-CM | POA: Diagnosis not present

## 2016-03-11 ENCOUNTER — Telehealth: Payer: Self-pay | Admitting: *Deleted

## 2016-03-11 ENCOUNTER — Telehealth: Payer: Self-pay

## 2016-03-11 LAB — ACETYLCHOLINE RECEPTOR, BINDING: AChR Binding Ab, Serum: <0.03 nmol/L (ref 0.00–0.24)

## 2016-03-11 LAB — ACETYLCHOLINE RECEPTOR, MODULATING: Acetylcholine Modulat Ab: <12 % (ref 0–20)

## 2016-03-11 LAB — ALDOLASE: ALDOLASE: 12.5 U/L — AB (ref 3.3–10.3)

## 2016-03-11 LAB — SEDIMENTATION RATE: SED RATE: 2 mm/h (ref 0–30)

## 2016-03-11 LAB — ACETYLCHOLINE RECEPTOR, BLOCKING: Acetylchol Block Ab: 3 % (ref 0–25)

## 2016-03-11 LAB — C-REACTIVE PROTEIN: CRP: 7.7 mg/L — ABNORMAL HIGH (ref 0.0–4.9)

## 2016-03-11 NOTE — Telephone Encounter (Signed)
I agree, lets see what his BP shows over the next week, if still elevated we will readjust

## 2016-03-11 NOTE — Telephone Encounter (Signed)
Rn call patient about his lab work. Rn stated the lab work was normal. Pt verbalized understanding.

## 2016-03-11 NOTE — Telephone Encounter (Signed)
-----   Message from Rosalin Hawking, MD sent at 03/11/2016  3:28 PM EST ----- Could you please let the patient know that the blood test done recently in our office was unremarkable. Please continue current treatment. Thanks.  Rosalin Hawking, MD PhD Stroke Neurology 03/11/2016 3:28 PM

## 2016-03-11 NOTE — Telephone Encounter (Signed)
Pt is calling to ask Dr [name] if she would consider readjusting his metoprolol, for he states since she decreased this at his last OV to metoprolol 50 mg po bid, he feels his BP has increased.  Pt is asymptomatic, just worried that his pressure is too high at 160/88.  Informed the pt that she reduced this because of his HR being low at 53 bpm at his last OV.   Advised the pt that being his is asymptomatic, he should take his metoprolol as prescribed.   Advised the pt to take his morning dose of metoprolol, wait an hour and then take his BP and HR, log this for one week, and then call our office to report his numbers.   Pt verbalized understanding and agrees with this plan.   Will route this message to Dr [name] for further review.

## 2016-03-12 NOTE — Telephone Encounter (Signed)
Informed the pt and wife of Dr Francesca Oman recommendation for the pt to log his BP over the next week, mychart this for [place] to review, then if his BP is still elevated, we will readjust then.  Both verbalized understanding and agrees with this plan.

## 2016-03-19 ENCOUNTER — Encounter: Payer: Self-pay | Admitting: Cardiology

## 2016-03-21 ENCOUNTER — Other Ambulatory Visit: Payer: Self-pay | Admitting: Cardiology

## 2016-03-24 LAB — CUP PACEART REMOTE DEVICE CHECK
Battery Remaining Longevity: 155 mo
Battery Voltage: 2.79 V
Brady Statistic AP VP Percent: 0 %
Brady Statistic AP VS Percent: 30 %
Brady Statistic AS VP Percent: 0 %
Implantable Lead Implant Date: 20160301
Implantable Lead Location: 753860
Implantable Lead Model: 5076
Implantable Lead Model: 5076
Implantable Pulse Generator Implant Date: 20160301
Lead Channel Impedance Value: 504 Ohm
Lead Channel Pacing Threshold Amplitude: 0.875 V
Lead Channel Pacing Threshold Pulse Width: 0.4 ms
Lead Channel Pacing Threshold Pulse Width: 0.4 ms
Lead Channel Setting Pacing Amplitude: 2 V
Lead Channel Setting Pacing Pulse Width: 0.4 ms
MDC IDC LEAD IMPLANT DT: 20160301
MDC IDC LEAD LOCATION: 753859
MDC IDC MSMT BATTERY IMPEDANCE: 111 Ohm
MDC IDC MSMT LEADCHNL RV IMPEDANCE VALUE: 690 Ohm
MDC IDC MSMT LEADCHNL RV PACING THRESHOLD AMPLITUDE: 0.625 V
MDC IDC SESS DTM: 20171206191537
MDC IDC SET LEADCHNL RV PACING AMPLITUDE: 2.5 V
MDC IDC SET LEADCHNL RV SENSING SENSITIVITY: 5.6 mV
MDC IDC STAT BRADY AS VS PERCENT: 70 %

## 2016-03-24 NOTE — Telephone Encounter (Signed)
Okay to refill? Please advise. Thanks, MI 

## 2016-03-24 NOTE — Telephone Encounter (Signed)
Yes please refill 

## 2016-03-26 ENCOUNTER — Other Ambulatory Visit: Payer: Self-pay

## 2016-03-26 MED ORDER — BUDESONIDE-FORMOTEROL FUMARATE 160-4.5 MCG/ACT IN AERO: 2.0000 | INHALATION_SPRAY | Freq: Two times a day (BID) | RESPIRATORY_TRACT | 0 refills | Status: DC

## 2016-03-27 ENCOUNTER — Ambulatory Visit (INDEPENDENT_AMBULATORY_CARE_PROVIDER_SITE_OTHER): Payer: PPO

## 2016-03-27 DIAGNOSIS — I4891 Unspecified atrial fibrillation: Secondary | ICD-10-CM | POA: Diagnosis not present

## 2016-03-27 DIAGNOSIS — Z954 Presence of other heart-valve replacement: Secondary | ICD-10-CM | POA: Diagnosis not present

## 2016-03-27 DIAGNOSIS — Z5181 Encounter for therapeutic drug level monitoring: Secondary | ICD-10-CM

## 2016-03-27 DIAGNOSIS — I5033 Acute on chronic diastolic (congestive) heart failure: Secondary | ICD-10-CM | POA: Diagnosis not present

## 2016-03-27 DIAGNOSIS — I050 Rheumatic mitral stenosis: Secondary | ICD-10-CM

## 2016-03-27 LAB — POCT INR: INR: 2

## 2016-03-27 NOTE — Telephone Encounter (Signed)
Samples for Stiolto were placed up front to pick up 02/24/16. These were never picked up.  Samples have been logged back in and put back on the shelf. Nothing further needed.

## 2016-03-30 DIAGNOSIS — F329 Major depressive disorder, single episode, unspecified: Secondary | ICD-10-CM | POA: Diagnosis not present

## 2016-04-07 ENCOUNTER — Other Ambulatory Visit: Payer: Self-pay

## 2016-04-07 DIAGNOSIS — G4733 Obstructive sleep apnea (adult) (pediatric): Secondary | ICD-10-CM

## 2016-04-10 ENCOUNTER — Ambulatory Visit (INDEPENDENT_AMBULATORY_CARE_PROVIDER_SITE_OTHER): Payer: PPO | Admitting: Pharmacist

## 2016-04-10 DIAGNOSIS — Z5181 Encounter for therapeutic drug level monitoring: Secondary | ICD-10-CM | POA: Diagnosis not present

## 2016-04-10 DIAGNOSIS — I4891 Unspecified atrial fibrillation: Secondary | ICD-10-CM | POA: Diagnosis not present

## 2016-04-10 DIAGNOSIS — I5033 Acute on chronic diastolic (congestive) heart failure: Secondary | ICD-10-CM | POA: Diagnosis not present

## 2016-04-10 DIAGNOSIS — I050 Rheumatic mitral stenosis: Secondary | ICD-10-CM

## 2016-04-10 DIAGNOSIS — Z954 Presence of other heart-valve replacement: Secondary | ICD-10-CM | POA: Diagnosis not present

## 2016-04-10 LAB — POCT INR: INR: 3.9

## 2016-04-15 ENCOUNTER — Ambulatory Visit (INDEPENDENT_AMBULATORY_CARE_PROVIDER_SITE_OTHER): Payer: PPO | Admitting: Internal Medicine

## 2016-04-15 ENCOUNTER — Encounter: Payer: Self-pay | Admitting: Internal Medicine

## 2016-04-15 VITALS — BP 112/70 | HR 60 | Ht 68.0 in | Wt 202.8 lb

## 2016-04-15 DIAGNOSIS — I472 Ventricular tachycardia: Secondary | ICD-10-CM

## 2016-04-15 DIAGNOSIS — I4729 Other ventricular tachycardia: Secondary | ICD-10-CM

## 2016-04-15 DIAGNOSIS — Z9889 Other specified postprocedural states: Secondary | ICD-10-CM

## 2016-04-15 DIAGNOSIS — I495 Sick sinus syndrome: Secondary | ICD-10-CM

## 2016-04-15 DIAGNOSIS — I442 Atrioventricular block, complete: Secondary | ICD-10-CM | POA: Diagnosis not present

## 2016-04-15 DIAGNOSIS — Z954 Presence of other heart-valve replacement: Secondary | ICD-10-CM

## 2016-04-15 DIAGNOSIS — I480 Paroxysmal atrial fibrillation: Secondary | ICD-10-CM | POA: Diagnosis not present

## 2016-04-15 LAB — CUP PACEART INCLINIC DEVICE CHECK
Battery Remaining Longevity: 154 mo
Battery Voltage: 2.78 V
Brady Statistic AP VP Percent: 0 %
Brady Statistic AS VP Percent: 0 %
Date Time Interrogation Session: 20180124152601
Implantable Lead Implant Date: 20160301
Implantable Lead Implant Date: 20160301
Implantable Lead Location: 753859
Implantable Lead Model: 5076
Implantable Lead Model: 5076
Implantable Pulse Generator Implant Date: 20160301
Lead Channel Impedance Value: 662 Ohm
Lead Channel Pacing Threshold Amplitude: 0.625 V
Lead Channel Pacing Threshold Pulse Width: 0.4 ms
Lead Channel Pacing Threshold Pulse Width: 0.4 ms
Lead Channel Sensing Intrinsic Amplitude: 11.2 mV
Lead Channel Setting Pacing Amplitude: 2 V
Lead Channel Setting Pacing Amplitude: 2.5 V
Lead Channel Setting Sensing Sensitivity: 5.6 mV
MDC IDC LEAD LOCATION: 753860
MDC IDC MSMT BATTERY IMPEDANCE: 111 Ohm
MDC IDC MSMT LEADCHNL RA IMPEDANCE VALUE: 519 Ohm
MDC IDC MSMT LEADCHNL RA PACING THRESHOLD AMPLITUDE: 0.75 V
MDC IDC MSMT LEADCHNL RA PACING THRESHOLD PULSEWIDTH: 0.4 ms
MDC IDC MSMT LEADCHNL RA SENSING INTR AMPL: 4 mV
MDC IDC MSMT LEADCHNL RV PACING THRESHOLD AMPLITUDE: 0.625 V
MDC IDC MSMT LEADCHNL RV PACING THRESHOLD AMPLITUDE: 0.75 V
MDC IDC MSMT LEADCHNL RV PACING THRESHOLD PULSEWIDTH: 0.4 ms
MDC IDC SET LEADCHNL RV PACING PULSEWIDTH: 0.4 ms
MDC IDC STAT BRADY AP VS PERCENT: 35 %
MDC IDC STAT BRADY AS VS PERCENT: 65 %

## 2016-04-15 NOTE — Progress Notes (Signed)
Electrophysiology Office Note Date: 04/15/2016  ID:  Kevin English, DOB [birth year], MRN [id number]  PCP: Mauricio Po, FNP Primary Cardiologist: [name] Electrophysiologist: Dao Memmott  CC: NSVT seen on recent remote transmission   Kevin English is a 61 y.o. male seen today for routine electrophysiology followup.  Since last being seen in our clinic, the patient reports doing reasonably well.  Recent remote transmission demonstrated episodes of NSVT and he was asked to come in today to further evaluate.  He has not had symptoms with NSVT episodes but has had periods of "head heaviness" that occur after eating. He is eating frequent small meals. He has not had focal neuro defects, vision changes, numbness, or weakness.  He denies chest pain, palpitations, dyspnea, PND, orthopnea, nausea, vomiting, dizziness, syncope, edema, weight gain, or early satiety.  Device History: MDT dual chamber PPM implanted 2016 for complete heart block    Past Medical History:  Diagnosis Date  . Anxiety   . Borderline diabetes   . CAD (coronary artery disease)    a. By cath 08/2013: 70% distal cx-prox LPDA; tandem mod LAD lesions, o/w mild dz.  . CKD (chronic kidney disease), stage II    his creat. runs 2-2.5; hasnt seen neph yet - sees dr. Tresa Moore at [facility]  . Complete heart block (HCC)    a. s/p PPM   . Deafness in left ear   . Depression   . Diverticulitis of colon 15 years ago  . Hernia   . HTN (hypertension)   . Hx of echocardiogram    post MVR >> Echo 4/16:  Mild LVH, EF 45-50%, apical dyskinesis, trivial AI, mechanical MVR functioning appropriately, severe LAE  . Hyperlipidemia   . Hypogonadism male   . Hypothyroidism   . Lung nodule seen on imaging study 04/10/2014   5 mm nodule right lower lobe  . Mitral stenosis    a. Dx 07/2013 - through workup of 2D echo, TEE, and cath, felt to be moderate.  . Obesity   . OSA on CPAP   . Persistent atrial fibrillation ([place])    a. sustained 160-180 on  e-CARDIO monitor placed 07/29/2013. Placed on IV amiodarone at end of May 2015 due to continued paroxysms with RVR.  Marland Kitchen Renal cell carcinoma of right kidney ([place]) 09/05/2013   clear cell renal cell carcinoma of right kidney treated by radical nephrectomy, Fuhrman grade 2-3, with maximum tumor diameter 2.7 cm, all tumor to find confined to the kidney, and all surgical margins negative (T1aN0).   . Renal mass    a. Dx 08/2013: concerning for renal cancer.  . Rheumatic heart disease   . S/P Minimally invasive maze operation for atrial fibrillation 05/15/2014   Left side lesion set using cryothermy with clipping of LA appendage  . S/P minimally invasive mitral valve replacement with metallic valve and maze procedure 05/15/2014   11mm Sorin Carbomedics Optiform mechanical valve placed via right mini thoracotomy approach  . Stroke [facility]) 1998   "MRI showed 3 mild strokes"   Past Surgical History:  Procedure Laterality Date  . CARDIAC CATHETERIZATION  08/28/13  . EYE SURGERY Right 1990   "reconstructive, lens implant- from paintball accident"  . INGUINAL HERNIA REPAIR Bilateral first one in 80's   "I had one side done twice"  . LEFT AND RIGHT HEART CATHETERIZATION WITH CORONARY ANGIOGRAM N/A 08/28/2013   Procedure: LEFT AND RIGHT HEART CATHETERIZATION WITH CORONARY ANGIOGRAM;  Surgeon: Leonie Man, MD;  Location: [facility] CATH LAB;  Service: Cardiovascular;  Laterality: N/A;  . MINIMALLY INVASIVE MAZE PROCEDURE N/A 05/15/2014   Procedure: MINIMALLY INVASIVE MAZE PROCEDURE;  Surgeon: Rexene Alberts, MD;  Location: [place];  Service: Open Heart Surgery;  Laterality: N/A;  . MITRAL VALVE REPLACEMENT Right 05/15/2014   Procedure: MINIMALLY INVASIVE MITRAL VALVE (MV) REPLACEMENT;  Surgeon: Rexene Alberts, MD;  Location: [place];  Service: Open Heart Surgery;  Laterality: Right;  . PERMANENT PACEMAKER INSERTION Left 05/22/2014   Procedure: PERMANENT PACEMAKER INSERTION;  Surgeon: Thompson Grayer, MD;  Location: [facility] CATH LAB;   Service: Cardiovascular;  Laterality: Left;  . ROBOT ASSISTED LAPAROSCOPIC NEPHRECTOMY Right 09/05/2013   Procedure: ROBOTIC ASSISTED LAPAROSCOPIC RIGHT RADICAL NEPHRECTOMY;  Surgeon: Sharyn Creamer, MD;  [facility];  Service: Urology;  Laterality: Right;  . TEE WITHOUT CARDIOVERSION N/A 08/16/2013   Procedure: TRANSESOPHAGEAL ECHOCARDIOGRAM (TEE);  Surgeon: Dorothy Spark, MD;  Location: [place];  Service: Cardiovascular;  Laterality: N/A;  . TEE WITHOUT CARDIOVERSION N/A 05/15/2014   Procedure: TRANSESOPHAGEAL ECHOCARDIOGRAM (TEE);  Surgeon: Rexene Alberts, MD;  Location: [place];  Service: Open Heart Surgery;  Laterality: N/A;    Current Outpatient Prescriptions  Medication Sig Dispense Refill  . acetaminophen (TYLENOL) 325 MG tablet Take 325 mg by mouth every 6 (six) hours as needed (pain).     Marland Kitchen ALPRAZolam (XANAX) 0.5 MG tablet Take 1-2 tablets (0.5-1 mg total) by mouth at bedtime as needed for anxiety or sleep. 90 tablet 2  . amLODipine (NORVASC) 10 MG tablet Take 1 tablet (10 mg total) by mouth daily. 90 tablet 3  . aspirin EC 81 MG tablet Take 1 tablet (81 mg total) by mouth daily.    . budesonide-formoterol (SYMBICORT) 160-4.5 MCG/ACT inhaler Inhale 2 puffs into the lungs 2 (two) times daily. 2 Inhaler 0  . fluticasone (FLONASE) 50 MCG/ACT nasal spray Place 2 sprays into both nostrils daily.  2  . levothyroxine (SYNTHROID, LEVOTHROID) 75 MCG tablet Take 1 tablet (75 mcg total) by mouth daily before breakfast. 90 tablet 3  . loratadine (CLARITIN) 10 MG tablet Take 1 tablet (10 mg total) by mouth daily.    . metoprolol (LOPRESSOR) 50 MG tablet Take 1 tablet (50 mg total) by mouth 2 (two) times daily. 180 tablet 3  . omega-3 acid ethyl esters (LOVAZA) 1 g capsule TAKE 2 CAPSULES TWICE A DAY 360 capsule 2  . sertraline (ZOLOFT) 100 MG tablet Take 1.5 tablet by mouth daily    . Tiotropium Bromide-Olodaterol (STIOLTO RESPIMAT) 2.5-2.5 MCG/ACT AERS Inhale 2 puffs into the lungs  daily. 4 g 4  . warfarin (COUMADIN) 5 MG tablet Take As directed by Anticoagulation Clinic (Patient taking differently: Take As directed by Anticoagulation Clinic) 40 tablet 3   No current facility-administered medications for this visit.     Allergies:   Contrast media [iodinated diagnostic agents]; Ioxaglate; Atorvastatin; Crestor [rosuvastatin]; Fenofibrate; and Pravastatin   Social History: Social History   Social History  . Marital status: Married    Spouse name: N/A  . Number of children: 2  . Years of education: 16   Occupational History  . Retired     Social History Main Topics  . Smoking status: Former Smoker    Packs/day: 0.25    Years: 40.00    Types: Cigarettes, Cigars    Quit date: 03/02/2016  . Smokeless tobacco: Never Used     Comment: Cigar every now and then  . Alcohol use No  . Drug use: No  .  Sexual activity: Not Currently   Other Topics Concern  . Not on file   Social History Narrative   Fun: [employer] people.     Family History: Family History  Problem Relation Age of Onset  . Hypertension Mother   . Heart attack Mother   . Stroke Mother   . Hypertension       Review of Systems: All other systems reviewed and are otherwise negative except as noted above.   Physical Exam: VS:  BP 112/70   Pulse 60   Ht 5\' 8"  (1.727 m)   Wt 202 lb 12.8 oz (92 kg)   SpO2 98%   BMI 30.84 kg/m  , BMI Body mass index is 30.84 kg/m.  GEN- The patient is well appearing, alert and oriented x 3 today.   HEENT: normocephalic, atraumatic; sclera clear, conjunctiva pink; hearing intact; oropharynx clear; neck supple  Lungs- Clear to ausculation bilaterally, normal work of breathing.  No wheezes, rales, rhonchi Heart- Regular rate and rhythm, no murmurs, rubs or gallops  GI- soft, non-tender, non-distended, bowel sounds present  Extremities- no clubbing, cyanosis, or edema; DP/PT/radial pulses 2+ bilaterally MS- no significant deformity or atrophy Skin-  warm and dry, no rash or lesion; PPM pocket well healed Psych- euthymic mood, full affect Neuro- strength and sensation are intact  PPM Interrogation- reviewed in detail today,  See PACEART report  EKG:  EKG is ordered today. EKG today demonstrates atrial pacing with intrinsic ventricular conduction  Recent Labs: 01/27/2016: TSH 0.91 03/03/2016: ALT 31; BUN 17; Creatinine, Ser 1.44; Hemoglobin 18.0; Platelets 224; Potassium 4.3; Sodium 138   Wt Readings from Last 3 Encounters:  04/15/16 202 lb 12.8 oz (92 kg)  03/06/16 195 lb (88.5 kg)  03/05/16 199 lb 6.4 oz (90.4 kg)     Other studies Reviewed: Additional studies/ records that were reviewed today include: Dr Rayann Heman and Dr Francesca Oman office notes  Assessment and Plan:  1.  Transient complete heart block  Normal PPM function See Pace Art report No changes today  2.  Paroxysmal atrial fibrillation Burden 0% by device interrogation today Continue Warfarin for CHADS2VASC of 5 No bleeding issues  3.  MV disease s/p MVR Continue Warfarin Followed by Dr [name]  4.  HTN Stable No change required today  5.  CAD No recent ischemic symptoms Continue medical therapy   6.  NSVT Asymptomatic Will update echo to assess LV function No further work up planned if EF normal    Current medicines are reviewed at length with the patient today.   The patient does not have concerns regarding his medicines.  The following changes were made today:  none  Labs/ tests ordered today include: none    Disposition:   Follow up with [facility] transmissions,EP NP in 1 year   Signed, Thompson Grayer, MD 04/15/2016 2:50 PM  [facility] [address] [place] [address] [phone] (office) [phone] (fax)

## 2016-04-15 NOTE — Patient Instructions (Signed)
Medication Instructions: Your physician recommends that you continue on your current medications as directed. Please refer to the Current Medication list given to you today.   Labwork: None Ordered  Procedures/Testing: Your physician has requested that you have an echocardiogram. Echocardiography is a painless test that uses sound waves to create images of your heart. It provides your doctor with information about the size and shape of your heart and how well your heart's chambers and valves are working. This procedure takes approximately one hour. There are no restrictions for this procedure.    Follow-Up: Remote monitoring is used to monitor your Pacemaker from home. This monitoring reduces the number of office visits required to check your device to one time per year. It allows [place] to keep an eye on the functioning of your device to ensure it is working properly. You are scheduled for a device check from home on 07/15/16.. You may send your transmission at any time that day. If you have a wireless device, the transmission will be sent automatically. After your physician reviews your transmission, you will receive a postcard with your next transmission date.  Your physician wants you to follow-up in: 12 Months with Chanetta Marshall, NP. You will receive a reminder letter in the mail two months in advance. If you don't receive a letter, please call our office to schedule the follow-up appointment.      Any Additional Special Instructions Will Be Listed Below (If Applicable).     If you need a refill on your cardiac medications before your next appointment, please call your pharmacy.

## 2016-04-23 ENCOUNTER — Encounter: Payer: PPO | Admitting: Neurology

## 2016-04-27 DIAGNOSIS — F329 Major depressive disorder, single episode, unspecified: Secondary | ICD-10-CM | POA: Diagnosis not present

## 2016-04-30 DIAGNOSIS — H52221 Regular astigmatism, right eye: Secondary | ICD-10-CM | POA: Diagnosis not present

## 2016-04-30 DIAGNOSIS — H524 Presbyopia: Secondary | ICD-10-CM | POA: Diagnosis not present

## 2016-04-30 DIAGNOSIS — H5211 Myopia, right eye: Secondary | ICD-10-CM | POA: Diagnosis not present

## 2016-04-30 DIAGNOSIS — H52222 Regular astigmatism, left eye: Secondary | ICD-10-CM | POA: Diagnosis not present

## 2016-05-01 ENCOUNTER — Other Ambulatory Visit: Payer: Self-pay

## 2016-05-01 ENCOUNTER — Ambulatory Visit (INDEPENDENT_AMBULATORY_CARE_PROVIDER_SITE_OTHER): Payer: PPO | Admitting: *Deleted

## 2016-05-01 ENCOUNTER — Ambulatory Visit (HOSPITAL_COMMUNITY): Payer: PPO | Attending: Internal Medicine

## 2016-05-01 DIAGNOSIS — Z954 Presence of other heart-valve replacement: Secondary | ICD-10-CM

## 2016-05-01 DIAGNOSIS — I351 Nonrheumatic aortic (valve) insufficiency: Secondary | ICD-10-CM | POA: Insufficient documentation

## 2016-05-01 DIAGNOSIS — I4891 Unspecified atrial fibrillation: Secondary | ICD-10-CM | POA: Diagnosis not present

## 2016-05-01 DIAGNOSIS — I050 Rheumatic mitral stenosis: Secondary | ICD-10-CM

## 2016-05-01 DIAGNOSIS — Z5181 Encounter for therapeutic drug level monitoring: Secondary | ICD-10-CM

## 2016-05-01 DIAGNOSIS — I5033 Acute on chronic diastolic (congestive) heart failure: Secondary | ICD-10-CM | POA: Diagnosis not present

## 2016-05-01 DIAGNOSIS — I517 Cardiomegaly: Secondary | ICD-10-CM | POA: Diagnosis not present

## 2016-05-01 DIAGNOSIS — I472 Ventricular tachycardia: Secondary | ICD-10-CM

## 2016-05-01 DIAGNOSIS — I4729 Other ventricular tachycardia: Secondary | ICD-10-CM

## 2016-05-01 LAB — POCT INR: INR: 4.7

## 2016-05-13 ENCOUNTER — Ambulatory Visit (HOSPITAL_COMMUNITY)
Admission: RE | Admit: 2016-05-13 | Discharge: 2016-05-13 | Disposition: A | Discharge status: Home / Self Care | Payer: PPO | Source: Ambulatory Visit | Attending: Nurse Practitioner | Admitting: Nurse Practitioner

## 2016-05-13 ENCOUNTER — Ambulatory Visit (INDEPENDENT_AMBULATORY_CARE_PROVIDER_SITE_OTHER): Payer: PPO | Admitting: Pharmacist

## 2016-05-13 ENCOUNTER — Encounter (HOSPITAL_COMMUNITY): Payer: Self-pay | Admitting: Nurse Practitioner

## 2016-05-13 VITALS — BP 126/84 | HR 60 | Ht 68.0 in | Wt 200.0 lb

## 2016-05-13 DIAGNOSIS — Z7982 Long term (current) use of aspirin: Secondary | ICD-10-CM | POA: Diagnosis not present

## 2016-05-13 DIAGNOSIS — Z954 Presence of other heart-valve replacement: Secondary | ICD-10-CM

## 2016-05-13 DIAGNOSIS — I481 Persistent atrial fibrillation: Secondary | ICD-10-CM | POA: Insufficient documentation

## 2016-05-13 DIAGNOSIS — Z5181 Encounter for therapeutic drug level monitoring: Secondary | ICD-10-CM | POA: Diagnosis not present

## 2016-05-13 DIAGNOSIS — Z952 Presence of prosthetic heart valve: Secondary | ICD-10-CM | POA: Insufficient documentation

## 2016-05-13 DIAGNOSIS — I129 Hypertensive chronic kidney disease with stage 1 through stage 4 chronic kidney disease, or unspecified chronic kidney disease: Secondary | ICD-10-CM | POA: Insufficient documentation

## 2016-05-13 DIAGNOSIS — Z7901 Long term (current) use of anticoagulants: Secondary | ICD-10-CM | POA: Insufficient documentation

## 2016-05-13 DIAGNOSIS — F329 Major depressive disorder, single episode, unspecified: Secondary | ICD-10-CM | POA: Diagnosis not present

## 2016-05-13 DIAGNOSIS — I050 Rheumatic mitral stenosis: Secondary | ICD-10-CM

## 2016-05-13 DIAGNOSIS — I4891 Unspecified atrial fibrillation: Secondary | ICD-10-CM | POA: Diagnosis not present

## 2016-05-13 DIAGNOSIS — Z8679 Personal history of other diseases of the circulatory system: Secondary | ICD-10-CM

## 2016-05-13 DIAGNOSIS — Z9889 Other specified postprocedural states: Secondary | ICD-10-CM | POA: Insufficient documentation

## 2016-05-13 DIAGNOSIS — G4733 Obstructive sleep apnea (adult) (pediatric): Secondary | ICD-10-CM | POA: Diagnosis not present

## 2016-05-13 DIAGNOSIS — I5033 Acute on chronic diastolic (congestive) heart failure: Secondary | ICD-10-CM

## 2016-05-13 DIAGNOSIS — Z87891 Personal history of nicotine dependence: Secondary | ICD-10-CM | POA: Diagnosis not present

## 2016-05-13 DIAGNOSIS — Z79899 Other long term (current) drug therapy: Secondary | ICD-10-CM | POA: Insufficient documentation

## 2016-05-13 DIAGNOSIS — N182 Chronic kidney disease, stage 2 (mild): Secondary | ICD-10-CM | POA: Insufficient documentation

## 2016-05-13 LAB — POCT INR: INR: 4.5

## 2016-05-13 NOTE — Progress Notes (Signed)
Primary Care Physician: Mauricio Po, [place] Referring Physician: Dr. 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   He is in the afib clinic for long term surveillance of afib s/p maze procedure. He received a PPM at time of surgery. Interrogations  of his device and not shown any afib. He has had depression that has required treatment since his surgery. He feels well today. He has not noted any irregular heart beat. He feels much improved with surgery and his fatigue and shortness of breath is much improved. He continues on warfarin for metal valve.  Today, he denies symptoms of palpitations, chest pain, shortness of breath, orthopnea, PND, lower extremity edema, dizziness, presyncope, syncope, or neurologic sequela. The patient is tolerating medications without difficulties and is otherwise without complaint today.   Past Medical History:  Diagnosis Date  . Anxiety   . Borderline diabetes   . CAD (coronary artery disease)    a. By cath 08/2013: 70% distal cx-prox LPDA; tandem mod LAD lesions, o/w mild dz.  . CKD (chronic kidney disease), stage II    his creat. runs 2-2.5; hasnt seen neph yet - sees dr. Tresa Moore at [facility]  . Complete heart block (HCC)    a. s/p PPM   . Deafness in left ear   . Depression   . Diverticulitis of colon 15 years ago  . Hernia   . HTN (hypertension)   . Hx of echocardiogram    post MVR >> Echo 4/16:  Mild LVH, EF 45-50%, apical dyskinesis, trivial AI, mechanical MVR functioning appropriately, severe LAE  . Hyperlipidemia   . Hypogonadism male   . Hypothyroidism   . Lung nodule seen on imaging study 04/10/2014   5 mm nodule right lower lobe  . Mitral stenosis    a. Dx 07/2013 - through workup  of 2D echo, TEE, and cath, felt to be moderate.  . Obesity   . OSA on CPAP   . Persistent atrial fibrillation ([place])    a. sustained 160-180 on e-CARDIO monitor placed 07/29/2013. Placed on IV amiodarone at end of May 2015 due to continued paroxysms with RVR.  Marland Kitchen Renal cell carcinoma of right kidney ([place]) 09/05/2013   clear cell renal cell carcinoma of right kidney treated by radical nephrectomy, Fuhrman grade 2-3, with maximum tumor diameter 2.7 cm, all tumor to find confined to the kidney, and all surgical margins negative (T1aN0).   . Renal mass    a. Dx 08/2013: concerning for renal cancer.  . Rheumatic heart disease   . S/P Minimally invasive maze operation for atrial fibrillation 05/15/2014   Left side lesion set using cryothermy with clipping of LA appendage  . S/P minimally invasive mitral valve replacement with metallic valve and maze procedure 05/15/2014   62mm Sorin Carbomedics Optiform mechanical valve placed via right mini thoracotomy approach  . Stroke [facility]) 1998   "MRI showed 3 mild strokes"   Past Surgical History:  Procedure Laterality Date  . CARDIAC CATHETERIZATION  08/28/13  . EYE SURGERY Right 1990   "reconstructive, lens implant- from paintball accident"  . INGUINAL HERNIA REPAIR Bilateral first one in 80's   "I had one side done twice"  . LEFT AND RIGHT HEART CATHETERIZATION WITH  CORONARY ANGIOGRAM N/A 08/28/2013   Procedure: LEFT AND RIGHT HEART CATHETERIZATION WITH CORONARY ANGIOGRAM;  Surgeon: Leonie Man, MD;  Location: [facility] CATH LAB;  Service: Cardiovascular;  Laterality: N/A;  . MINIMALLY INVASIVE MAZE PROCEDURE N/A 05/15/2014   Procedure: MINIMALLY INVASIVE MAZE PROCEDURE;  Surgeon: Rexene Alberts, MD;  Location: [place];  Service: Open Heart Surgery;  Laterality: N/A;  . MITRAL VALVE REPLACEMENT Right 05/15/2014   Procedure: MINIMALLY INVASIVE MITRAL VALVE (MV) REPLACEMENT;  Surgeon: Rexene Alberts, MD;  Location: [place];  Service: Open Heart Surgery;  Laterality:  Right;  . PERMANENT PACEMAKER INSERTION Left 05/22/2014   Procedure: PERMANENT PACEMAKER INSERTION;  Surgeon: Thompson Grayer, MD;  Location: [facility] CATH LAB;  Service: Cardiovascular;  Laterality: Left;  . ROBOT ASSISTED LAPAROSCOPIC NEPHRECTOMY Right 09/05/2013   Procedure: ROBOTIC ASSISTED LAPAROSCOPIC RIGHT RADICAL NEPHRECTOMY;  Surgeon: Sharyn Creamer, MD;  [facility];  Service: Urology;  Laterality: Right;  . TEE WITHOUT CARDIOVERSION N/A 08/16/2013   Procedure: TRANSESOPHAGEAL ECHOCARDIOGRAM (TEE);  Surgeon: Dorothy Spark, MD;  Location: [place];  Service: Cardiovascular;  Laterality: N/A;  . TEE WITHOUT CARDIOVERSION N/A 05/15/2014   Procedure: TRANSESOPHAGEAL ECHOCARDIOGRAM (TEE);  Surgeon: Rexene Alberts, MD;  Location: [place];  Service: Open Heart Surgery;  Laterality: N/A;    Current Outpatient Prescriptions  Medication Sig Dispense Refill  . acetaminophen (TYLENOL) 325 MG tablet Take 325 mg by mouth every 6 (six) hours as needed (pain).     Marland Kitchen ALPRAZolam (XANAX) 0.5 MG tablet Take 1-2 tablets (0.5-1 mg total) by mouth at bedtime as needed for anxiety or sleep. 90 tablet 2  . amLODipine (NORVASC) 10 MG tablet Take 1 tablet (10 mg total) by mouth daily. 90 tablet 3  . aspirin EC 81 MG tablet Take 1 tablet (81 mg total) by mouth daily.    Marland Kitchen levothyroxine (SYNTHROID, LEVOTHROID) 75 MCG tablet Take 1 tablet (75 mcg total) by mouth daily before breakfast. 90 tablet 3  . loratadine (CLARITIN) 10 MG tablet Take 1 tablet (10 mg total) by mouth daily.    . metoprolol (LOPRESSOR) 50 MG tablet Take 1 tablet (50 mg total) by mouth 2 (two) times daily. 180 tablet 3  . omega-3 acid ethyl esters (LOVAZA) 1 g capsule TAKE 2 CAPSULES TWICE A DAY 360 capsule 2  . sertraline (ZOLOFT) 100 MG tablet Take 100 mg by mouth daily.     Marland Kitchen warfarin (COUMADIN) 5 MG tablet Take As directed by Anticoagulation Clinic (Patient taking differently: Take As directed by Anticoagulation Clinic) 40 tablet 3  .  fluticasone (FLONASE) 50 MCG/ACT nasal spray Place 2 sprays into both nostrils daily. (Patient not taking: Reported on 05/13/2016)  2  . Tiotropium Bromide-Olodaterol (STIOLTO RESPIMAT) 2.5-2.5 MCG/ACT AERS Inhale 2 puffs into the lungs daily. (Patient not taking: Reported on 05/13/2016) 4 g 4   No current facility-administered medications for this encounter.     Allergies  Allergen Reactions  . Contrast Media [Iodinated Diagnostic Agents] Hives and Other (See Comments)    Patient started sneezing and coughing, patient broke out in hives, patient needs 13 hour prep if having IV contrast  . Ioxaglate Hives and Rash    Patient started sneezing and coughing, patient broke out in hives, patient needs 13 hour prep if having IV contrast  . Atorvastatin Other (See Comments)    Myalgias  . Crestor [Rosuvastatin] Other (See Comments)    Myalgias  . Fenofibrate Other (See Comments)    MYALGIAS   .  Pravastatin Other (See Comments)    Myalgias     Social History   Social History  . Marital status: Married    Spouse name: N/A  . Number of children: 2  . Years of education: 16   Occupational History  . Retired     Social History Main Topics  . Smoking status: Former Smoker    Packs/day: 0.25    Years: 40.00    Types: Cigarettes, Cigars    Quit date: 03/02/2016  . Smokeless tobacco: Never Used     Comment: Cigar every now and then  . Alcohol use No  . Drug use: No  . Sexual activity: Not Currently   Other Topics Concern  . Not on file   Social History Narrative   Fun: [employer] people.     Family History  Problem Relation Age of Onset  . Hypertension Mother   . Heart attack Mother   . Stroke Mother   . Hypertension      ROS- All systems are reviewed and negative except as per the HPI above  Physical Exam: Vitals:   05/13/16 1104  BP: 126/84  Pulse: 60  Weight: 200 lb (90.7 kg)  Height: 5\' 8"  (1.727 m)   Wt Readings from Last 3 Encounters:  05/13/16 200 lb  (90.7 kg)  04/15/16 202 lb 12.8 oz (92 kg)  03/06/16 195 lb (88.5 kg)    Labs: Lab Results  Component Value Date   NA 138 03/03/2016   K 4.3 03/03/2016   CL 109 03/03/2016   CO2 21 (L) 03/03/2016   GLUCOSE 254 (H) 03/03/2016   BUN 17 03/03/2016   CREATININE 1.44 (H) 03/03/2016   CALCIUM 9.7 03/03/2016   MG 2.7 (H) 05/16/2014   Lab Results  Component Value Date   INR 4.5 05/13/2016   Lab Results  Component Value Date   CHOL 159 03/19/2015   HDL 19 (L) 03/19/2015   LDLCALC 86 03/19/2015   TRIG 272 (H) 03/19/2015     GEN- The patient is well appearing, alert and oriented x 3 today.   Head- normocephalic, atraumatic Eyes-  Sclera clear, conjunctiva pink Ears- hearing intact Oropharynx- clear Neck- supple, no JVP Lymph- no cervical lymphadenopathy Lungs- Clear to ausculation bilaterally, normal work of breathing Heart- Regular rate and rhythm, no murmurs, rubs or gallops, PMI not laterally displaced GI- soft, NT, ND, + BS Extremities- no clubbing, cyanosis, or edema MS- no significant deformity or atrophy Skin- no rash or lesion Psych- euthymic mood, full affect Neuro- strength and sensation are intact  EKG-atrial paced rhythm with prolonged AV conduction, RBBB, LAFB, pr int 216 ms, qrx int 142 ms, qtc 440 ms Epic records reviewed Remote and in office interrogations reviewed    Assessment and Plan: 1. Afib, s/p MVR and Maze procedure, clipping of LAA No evidence of recurrent afib Pt feels much improved with physical symptoms of shortness of breath and fatigue since SR has been restored Continue coumadin Continue metoprolol  2. PPM Per Dr. Rayann Heman  F/u in one year for long term surveillance for presence of afib s/p Maze procedure    Butch Penny C. Carroll, [facility] [address] [place], [place] [address] [phone]

## 2016-05-18 ENCOUNTER — Other Ambulatory Visit: Payer: Self-pay | Admitting: Cardiology

## 2016-05-20 ENCOUNTER — Ambulatory Visit: Payer: PPO | Admitting: Pulmonary Disease

## 2016-05-25 ENCOUNTER — Ambulatory Visit: Payer: PPO | Admitting: Neurology

## 2016-05-27 ENCOUNTER — Ambulatory Visit (INDEPENDENT_AMBULATORY_CARE_PROVIDER_SITE_OTHER): Payer: PPO | Admitting: *Deleted

## 2016-05-27 DIAGNOSIS — I5033 Acute on chronic diastolic (congestive) heart failure: Secondary | ICD-10-CM | POA: Diagnosis not present

## 2016-05-27 DIAGNOSIS — I4891 Unspecified atrial fibrillation: Secondary | ICD-10-CM | POA: Diagnosis not present

## 2016-05-27 DIAGNOSIS — I050 Rheumatic mitral stenosis: Secondary | ICD-10-CM

## 2016-05-27 DIAGNOSIS — Z5181 Encounter for therapeutic drug level monitoring: Secondary | ICD-10-CM

## 2016-05-27 DIAGNOSIS — Z954 Presence of other heart-valve replacement: Secondary | ICD-10-CM

## 2016-05-27 LAB — POCT INR: INR: 4.6

## 2016-05-28 ENCOUNTER — Telehealth: Payer: Self-pay | Admitting: Pulmonary Disease

## 2016-05-28 NOTE — Telephone Encounter (Signed)
   Sleep studies obtained from [facility] sleep center.  Diagnostic study done on 05/31/2009: AHI 15. CPAP titration study done on 06/14/2009: Optimal on CPAP 8 cm water.  Monica Becton, MD 05/28/2016, 1:10 PM [facility] Pager [phone] After 3 pm or if no answer, call [phone]

## 2016-06-03 ENCOUNTER — Telehealth: Payer: Self-pay | Admitting: Pulmonary Disease

## 2016-06-03 NOTE — Telephone Encounter (Signed)
   Sleep study report obtained from Dr. Vonzella Nipple from [place].   Sleep study done March 2011. [facility] [place], MD 06/03/2016, 12:44 PM [facility] Pager [phone] After 3 pm or if no answer, call [phone]

## 2016-06-08 ENCOUNTER — Ambulatory Visit (INDEPENDENT_AMBULATORY_CARE_PROVIDER_SITE_OTHER): Payer: PPO | Admitting: Cardiology

## 2016-06-08 ENCOUNTER — Encounter: Payer: Self-pay | Admitting: Cardiology

## 2016-06-08 ENCOUNTER — Ambulatory Visit (INDEPENDENT_AMBULATORY_CARE_PROVIDER_SITE_OTHER): Payer: PPO | Admitting: *Deleted

## 2016-06-08 VITALS — BP 120/72 | HR 71 | Ht 68.0 in | Wt 210.0 lb

## 2016-06-08 DIAGNOSIS — E782 Mixed hyperlipidemia: Secondary | ICD-10-CM | POA: Diagnosis not present

## 2016-06-08 DIAGNOSIS — Z954 Presence of other heart-valve replacement: Secondary | ICD-10-CM | POA: Diagnosis not present

## 2016-06-08 DIAGNOSIS — Z5181 Encounter for therapeutic drug level monitoring: Secondary | ICD-10-CM | POA: Diagnosis not present

## 2016-06-08 DIAGNOSIS — I050 Rheumatic mitral stenosis: Secondary | ICD-10-CM

## 2016-06-08 DIAGNOSIS — I480 Paroxysmal atrial fibrillation: Secondary | ICD-10-CM | POA: Diagnosis not present

## 2016-06-08 DIAGNOSIS — I5033 Acute on chronic diastolic (congestive) heart failure: Secondary | ICD-10-CM

## 2016-06-08 DIAGNOSIS — I4891 Unspecified atrial fibrillation: Secondary | ICD-10-CM

## 2016-06-08 DIAGNOSIS — I442 Atrioventricular block, complete: Secondary | ICD-10-CM | POA: Diagnosis not present

## 2016-06-08 LAB — POCT INR: INR: 2.1

## 2016-06-08 NOTE — Progress Notes (Signed)
Cardiology Office Note:    Date:  06/08/2016   ID:  Kevin English, DOB [birth year], MRN [id number]  PCP:  Mauricio Po, [place]  Cardiologist:  Dr. Ena Dawley   Electrophysiologist:  Dr. Thompson Grayer  Nephrologist: Dr. Marval Regal  Chief complain: Post ER visit, lower extremity weakness, depression  History of Present Illness:     Kevin English is a 61 y.o. male with a hx of HTN, HL (intol to statins), former smoker, PAFib, Rheumatic heart disease with severe mitral stenosis, diastolic HF, depression, CKD, renal cell CA, OSA. He has been on Coumadin. He had been prepared for minimally invasive MVR and placed on Amiodarone. However, he developed clear cell renal cell CA and underwent R nephrectomy. He developed fatigue with beta-blocker and Bystolic was DC'd. He was ultimately stabilized and underwent Minimally-Invasive Mitral Valve Replacement (Sorin Carbomedics Optiform bileaflet mechanical valve) and Maze Procedure with Left atrial lesion set using cryothermy and Clipping of Left Atrial Appendage with Dr. Roxy Manns in 2/16. Post op course was complicated by complete heart block. He ultimately required implantation of a Medtronic Adapta L dual-chamber pacemaker.   03/04/2016 - the patient has been dealing with progressively worsening depression, he meets with his friends but has no interest or joy in life, his children or family life. He states he doesn't care about his heart health because he doesn't feel well. On Tuesday, 03/03/2016 he was at [employer] with his friends when he walked to the bathroom and felt that he is legs were weak and went to the ground and couldn't get up. He denies any chest pain palpitation or dizziness at the time. An ambulance was called and he was taken to the ER. In the ER his labs were normal, and head CT showed no acute changes. He now denies any weakness. He was seen by neurologist yesterday who thinks that this is related to progressive severe depression but  order nerve conduction studies and electromyography study. Today he denies any chest pain and worsening shortness of breath he has been compliant with his warfarin today's INR 2.3. No bleeding. No palpitations. No lower extremity edema orthopnea or personal nocturnal dyspnea.  06/08/2016 - this is 3 months follow-up, the patient is doing well from cardiac standpoint he denies any chest pain, shortness of breath, no lower extremity edema or claudications. He also hasn't experienced any palpitations or syncope. He has been compliant with his medications and has no side effects including no bleeding with warfarin. His most significant problem is depression that prevents him from going outside most days, he refuses to do any exercises and is not motivated. He has seen a therapist but increased Zoloft dose to 100 mg a day. He was also seen by neurologist for muscle weakness with so far negative workup.   Past Medical History:  Diagnosis Date  . Anxiety   . Borderline diabetes   . CAD (coronary artery disease)    a. By cath 08/2013: 70% distal cx-prox LPDA; tandem mod LAD lesions, o/w mild dz.  . CKD (chronic kidney disease), stage II    his creat. runs 2-2.5; hasnt seen neph yet - sees dr. Tresa Moore at [facility]  . Complete heart block (HCC)    a. s/p PPM   . Deafness in left ear   . Depression   . Diverticulitis of colon 15 years ago  . Hernia   . HTN (hypertension)   . Hx of echocardiogram    post MVR >> Echo 4/16:  Mild LVH,  EF 45-50%, apical dyskinesis, trivial AI, mechanical MVR functioning appropriately, severe LAE  . Hyperlipidemia   . Hypogonadism male   . Hypothyroidism   . Lung nodule seen on imaging study 04/10/2014   5 mm nodule right lower lobe  . Mitral stenosis    a. Dx 07/2013 - through workup of 2D echo, TEE, and cath, felt to be moderate.  . Obesity   . OSA on CPAP   . Persistent atrial fibrillation ([place])    a. sustained 160-180 on e-CARDIO monitor placed 07/29/2013. Placed on IV  amiodarone at end of May 2015 due to continued paroxysms with RVR.  Marland Kitchen Renal cell carcinoma of right kidney ([place]) 09/05/2013   clear cell renal cell carcinoma of right kidney treated by radical nephrectomy, Fuhrman grade 2-3, with maximum tumor diameter 2.7 cm, all tumor to find confined to the kidney, and all surgical margins negative (T1aN0).   . Renal mass    a. Dx 08/2013: concerning for renal cancer.  . Rheumatic heart disease   . S/P Minimally invasive maze operation for atrial fibrillation 05/15/2014   Left side lesion set using cryothermy with clipping of LA appendage  . S/P minimally invasive mitral valve replacement with metallic valve and maze procedure 05/15/2014   96mm Sorin Carbomedics Optiform mechanical valve placed via right mini thoracotomy approach  . Stroke [facility]) 1998   "MRI showed 3 mild strokes"    Past Surgical History:  Procedure Laterality Date  . CARDIAC CATHETERIZATION  08/28/13  . EYE SURGERY Right 1990   "reconstructive, lens implant- from paintball accident"  . INGUINAL HERNIA REPAIR Bilateral first one in 80's   "I had one side done twice"  . LEFT AND RIGHT HEART CATHETERIZATION WITH CORONARY ANGIOGRAM N/A 08/28/2013   Procedure: LEFT AND RIGHT HEART CATHETERIZATION WITH CORONARY ANGIOGRAM;  Surgeon: Leonie Man, MD;  Location: [facility] CATH LAB;  Service: Cardiovascular;  Laterality: N/A;  . MINIMALLY INVASIVE MAZE PROCEDURE N/A 05/15/2014   Procedure: MINIMALLY INVASIVE MAZE PROCEDURE;  Surgeon: Rexene Alberts, MD;  Location: [place];  Service: Open Heart Surgery;  Laterality: N/A;  . MITRAL VALVE REPLACEMENT Right 05/15/2014   Procedure: MINIMALLY INVASIVE MITRAL VALVE (MV) REPLACEMENT;  Surgeon: Rexene Alberts, MD;  Location: [place];  Service: Open Heart Surgery;  Laterality: Right;  . PERMANENT PACEMAKER INSERTION Left 05/22/2014   Procedure: PERMANENT PACEMAKER INSERTION;  Surgeon: Thompson Grayer, MD;  Location: [facility] CATH LAB;  Service: Cardiovascular;  Laterality: Left;   . ROBOT ASSISTED LAPAROSCOPIC NEPHRECTOMY Right 09/05/2013   Procedure: ROBOTIC ASSISTED LAPAROSCOPIC RIGHT RADICAL NEPHRECTOMY;  Surgeon: Sharyn Creamer, MD;  Location[facility];  Service: Urology;  Laterality: Right;  . TEE WITHOUT CARDIOVERSION N/A 08/16/2013   Procedure: TRANSESOPHAGEAL ECHOCARDIOGRAM (TEE);  Surgeon: Dorothy Spark, MD;  Location: [place];  Service: Cardiovascular;  Laterality: N/A;  . TEE WITHOUT CARDIOVERSION N/A 05/15/2014   Procedure: TRANSESOPHAGEAL ECHOCARDIOGRAM (TEE);  Surgeon: Rexene Alberts, MD;  Location: [place];  Service: Open Heart Surgery;  Laterality: N/A;    Current Medications: Outpatient Medications Prior to Visit  Medication Sig Dispense Refill  . acetaminophen (TYLENOL) 325 MG tablet Take 325 mg by mouth every 6 (six) hours as needed (pain).     Marland Kitchen ALPRAZolam (XANAX) 0.5 MG tablet Take 1-2 tablets (0.5-1 mg total) by mouth at bedtime as needed for anxiety or sleep. 90 tablet 2  . amLODipine (NORVASC) 10 MG tablet Take 1 tablet (10 mg total) by mouth daily. 90 tablet  3  . aspirin EC 81 MG tablet Take 1 tablet (81 mg total) by mouth daily.    . fluticasone (FLONASE) 50 MCG/ACT nasal spray Place 2 sprays into both nostrils daily.  2  . levothyroxine (SYNTHROID, LEVOTHROID) 75 MCG tablet Take 1 tablet (75 mcg total) by mouth daily before breakfast. 90 tablet 3  . loratadine (CLARITIN) 10 MG tablet Take 1 tablet (10 mg total) by mouth daily.    Marland Kitchen omega-3 acid ethyl esters (LOVAZA) 1 g capsule TAKE 2 CAPSULES TWICE A DAY 360 capsule 2  . sertraline (ZOLOFT) 100 MG tablet Take 100 mg by mouth daily.     . Tiotropium Bromide-Olodaterol (STIOLTO RESPIMAT) 2.5-2.5 MCG/ACT AERS Inhale 2 puffs into the lungs daily. 4 g 4  . warfarin (COUMADIN) 5 MG tablet TAKE AS DIRECTED BY ANTICOAGULATION CLINIC 40 tablet 3  . metoprolol (LOPRESSOR) 50 MG tablet Take 1 tablet (50 mg total) by mouth 2 (two) times daily. 180 tablet 3   No facility-administered medications  prior to visit.      Allergies:   Contrast media [iodinated diagnostic agents]; Ioxaglate; Atorvastatin; Crestor [rosuvastatin]; Fenofibrate; and Pravastatin   Social History   Social History  . Marital status: Married    Spouse name: N/A  . Number of children: 2  . Years of education: 16   Occupational History  . Retired     Social History Main Topics  . Smoking status: Former Smoker    Packs/day: 0.25    Years: 40.00    Types: Cigarettes, Cigars    Quit date: 03/02/2016  . Smokeless tobacco: Never Used     Comment: Cigar every now and then  . Alcohol use No  . Drug use: No  . Sexual activity: Not Currently   Other Topics Concern  . None   Social History Narrative   Fun: [employer] people.      Family History:  The patient's family history includes Heart attack in his mother; Hypertension in his mother; Stroke in his mother.   ROS:   Please see the history of present illness.    ROS All other systems reviewed and are negative.   Physical Exam:    VS:  BP 120/72   Pulse 71   Ht 5\' 8"  (1.727 m)   Wt 210 lb (95.3 kg)   SpO2 96%   BMI 31.93 kg/m    GEN: Well nourished, well developed, in no acute distress  HEENT: normal  Neck: no JVD, no masses Cardiac: mechanical S1, normal S2, RRR; no murmurs, rubs, or gallops, no edema;  Respiratory:  clear to auscultation bilaterally; no wheezing, rhonchi or rales GI: soft, nontender, nondistended MS: no deformity or atrophy  Skin: warm and dry Neuro: No focal deficits  Psych: Alert and oriented x 3, normal affect  Wt Readings from Last 3 Encounters:  06/08/16 210 lb (95.3 kg)  05/13/16 200 lb (90.7 kg)  04/15/16 202 lb 12.8 oz (92 kg)      Studies/Labs Reviewed:     EKG:  EKG is  ordered today.  The ekg ordered today demonstrates A paced, HR 60, LAD, IVCD, NSSTTW changes, QTc 428 ms  Recent Labs: 01/27/2016: TSH 0.91 03/03/2016: ALT 31; BUN 17; Creatinine, Ser 1.44; Hemoglobin 18.0; Platelets 224; Potassium  4.3; Sodium 138  Labs at nephrologists office 06/13/15: BUN 17, creatinine 1.50, potassium 5, ALT 25  Recent Lipid Panel    Component Value Date/Time   CHOL 159 03/19/2015 0756   TRIG 272 (  H) 03/19/2015 0756   HDL 19 (L) 03/19/2015 0756   CHOLHDL 8.4 (H) 03/19/2015 0756   VLDL 54 (H) 03/19/2015 0756   LDLCALC 86 03/19/2015 0756   LDLDIRECT 62.0 12/13/2014 0845    Additional studies/ records that were reviewed today include:    Echo 7/16 Mild LVH, EF 50-55%, normal wall motion, trivial AI, mechanical MVR with normal function, mean gradient 4 mmHg, mild to moderate LAE, normal RV function  Echo 4/16 Mild LVH, EF 45-50%, apical dyskinesis, trivial AI, mechanical MVR okay, severe LAE  Carotid US 04/16/14 Bilateral - 1% to 39% ICA stensosis.  TEE 0/10/93 Normal systolic function, normal wall motion, mild AI, moderate LAE Impressions: Rheumatic mitral valve with moderate leaflet tip thickening and minimal calcifications. Moderate mitral stenosis and trace mitral regurgitation.  Cardiac cath 08/28/13 LM: Normal LAD: Proximal 40-50%, distal 50-60% LCx: Small OM 70-80% (not amenable to PCI or CABG), PDA 70% LPDA: 10-20% RCA: Mid 30-40%   ASSESSMENT:     1. S/P mitral valve replacement with metallic valve   2. Acute on chronic diastolic CHF (congestive heart failure), NYHA class 3 (HCC)   3. Paroxysmal atrial fibrillation ([place])   4. Transient complete heart block (HCC)   5. Mixed hyperlipidemia     PLAN:     In order of problems listed above:  1. Depression - the patient is going through major depression, Zoloft dose was increased with minimal improvement in his symptoms however still non satisfying results. He is strongly encouraged to force himself into regular exercise for 30 minutes a day.  2. HTN - Controlled on current regimen.  3. S/p Mechanical MVR - Echo in 7/16 with normal LV function, well functioning mechanical MVR. Continue Coumadin. Continue SBE  prophylaxis.No bleeding on Coumadin.  4. PAF - Maintaining NSR. 0% atrial fibrillation on pacemaker interrogation in May 2017. Continue Coumadin. Bradycardia resolved with decreased dose of metoprolol.  5. HL - Continue current Rx. Statin intol.  6. CAD - No angina.  Continue ASA, beta-blocker.  7. OSA - Continue CPAP.   Medication Adjustments/Labs and Tests Ordered: Current medicines are reviewed at length with the patient today.  Concerns regarding medicines are outlined above.  Medication changes, Labs and Tests ordered today are outlined in the Patient Instructions noted below. Patient Instructions  Medication Instructions:  Your physician recommends that you continue on your current medications as directed. Please refer to the Current Medication list given to you today.   Labwork: None   Testing/Procedures: None   Follow-Up: Your physician wants you to follow-up in: 6 months with Dr [name]. (September 2018).  You will receive a reminder letter in the mail two months in advance. If you don't receive a letter, please call our office to schedule the follow-up appointment.       If you need a refill on your cardiac medications before your next appointment, please call your pharmacy.    Signed, Ena Dawley, MD  06/08/2016 12:53 PM    [address], [place], [place]  [address] Phone: [phone]; Fax: [phone]

## 2016-06-08 NOTE — Patient Instructions (Signed)
Medication Instructions:  Your physician recommends that you continue on your current medications as directed. Please refer to the Current Medication list given to you today.   Labwork: None   Testing/Procedures: None   Follow-Up: Your physician wants you to follow-up in: 6 months with Dr [name]. (September 2018).  You will receive a reminder letter in the mail two months in advance. If you don't receive a letter, please call our office to schedule the follow-up appointment.       If you need a refill on your cardiac medications before your next appointment, please call your pharmacy.

## 2016-06-22 ENCOUNTER — Ambulatory Visit (INDEPENDENT_AMBULATORY_CARE_PROVIDER_SITE_OTHER): Payer: PPO | Admitting: *Deleted

## 2016-06-22 DIAGNOSIS — Z5181 Encounter for therapeutic drug level monitoring: Secondary | ICD-10-CM

## 2016-06-22 DIAGNOSIS — I5033 Acute on chronic diastolic (congestive) heart failure: Secondary | ICD-10-CM

## 2016-06-22 DIAGNOSIS — I4891 Unspecified atrial fibrillation: Secondary | ICD-10-CM

## 2016-06-22 DIAGNOSIS — I050 Rheumatic mitral stenosis: Secondary | ICD-10-CM | POA: Diagnosis not present

## 2016-06-22 DIAGNOSIS — Z954 Presence of other heart-valve replacement: Secondary | ICD-10-CM

## 2016-06-22 LAB — POCT INR: INR: 3.8

## 2016-07-03 DIAGNOSIS — N183 Chronic kidney disease, stage 3 (moderate): Secondary | ICD-10-CM | POA: Diagnosis not present

## 2016-07-05 ENCOUNTER — Other Ambulatory Visit: Payer: Self-pay | Admitting: Physician Assistant

## 2016-07-06 ENCOUNTER — Ambulatory Visit (INDEPENDENT_AMBULATORY_CARE_PROVIDER_SITE_OTHER): Payer: PPO | Admitting: *Deleted

## 2016-07-06 DIAGNOSIS — I5033 Acute on chronic diastolic (congestive) heart failure: Secondary | ICD-10-CM | POA: Diagnosis not present

## 2016-07-06 DIAGNOSIS — I050 Rheumatic mitral stenosis: Secondary | ICD-10-CM | POA: Diagnosis not present

## 2016-07-06 DIAGNOSIS — Z5181 Encounter for therapeutic drug level monitoring: Secondary | ICD-10-CM

## 2016-07-06 DIAGNOSIS — I4891 Unspecified atrial fibrillation: Secondary | ICD-10-CM

## 2016-07-06 DIAGNOSIS — Z954 Presence of other heart-valve replacement: Secondary | ICD-10-CM | POA: Diagnosis not present

## 2016-07-06 LAB — POCT INR: INR: 5.2

## 2016-07-08 ENCOUNTER — Telehealth: Payer: Self-pay | Admitting: Family

## 2016-07-08 DIAGNOSIS — F17201 Nicotine dependence, unspecified, in remission: Secondary | ICD-10-CM | POA: Diagnosis not present

## 2016-07-08 DIAGNOSIS — D751 Secondary polycythemia: Secondary | ICD-10-CM | POA: Diagnosis not present

## 2016-07-08 DIAGNOSIS — C649 Malignant neoplasm of unspecified kidney, except renal pelvis: Secondary | ICD-10-CM | POA: Diagnosis not present

## 2016-07-08 DIAGNOSIS — I4891 Unspecified atrial fibrillation: Secondary | ICD-10-CM | POA: Diagnosis not present

## 2016-07-08 DIAGNOSIS — N179 Acute kidney failure, unspecified: Secondary | ICD-10-CM | POA: Diagnosis not present

## 2016-07-08 DIAGNOSIS — N183 Chronic kidney disease, stage 3 (moderate): Secondary | ICD-10-CM | POA: Diagnosis not present

## 2016-07-08 DIAGNOSIS — Z6828 Body mass index (BMI) 28.0-28.9, adult: Secondary | ICD-10-CM | POA: Diagnosis not present

## 2016-07-08 DIAGNOSIS — I10 Essential (primary) hypertension: Secondary | ICD-10-CM | POA: Diagnosis not present

## 2016-07-08 DIAGNOSIS — Z8679 Personal history of other diseases of the circulatory system: Secondary | ICD-10-CM | POA: Diagnosis not present

## 2016-07-08 DIAGNOSIS — I509 Heart failure, unspecified: Secondary | ICD-10-CM | POA: Diagnosis not present

## 2016-07-08 DIAGNOSIS — R7303 Prediabetes: Secondary | ICD-10-CM | POA: Diagnosis not present

## 2016-07-08 DIAGNOSIS — Z952 Presence of prosthetic heart valve: Secondary | ICD-10-CM | POA: Diagnosis not present

## 2016-07-15 ENCOUNTER — Ambulatory Visit (INDEPENDENT_AMBULATORY_CARE_PROVIDER_SITE_OTHER): Payer: PPO | Admitting: *Deleted

## 2016-07-15 DIAGNOSIS — I495 Sick sinus syndrome: Secondary | ICD-10-CM

## 2016-07-15 NOTE — Progress Notes (Signed)
Remote pacemaker transmission.   

## 2016-07-16 ENCOUNTER — Ambulatory Visit (INDEPENDENT_AMBULATORY_CARE_PROVIDER_SITE_OTHER): Payer: PPO | Admitting: Pharmacist

## 2016-07-16 DIAGNOSIS — I5033 Acute on chronic diastolic (congestive) heart failure: Secondary | ICD-10-CM | POA: Diagnosis not present

## 2016-07-16 DIAGNOSIS — I4891 Unspecified atrial fibrillation: Secondary | ICD-10-CM

## 2016-07-16 DIAGNOSIS — I050 Rheumatic mitral stenosis: Secondary | ICD-10-CM

## 2016-07-16 DIAGNOSIS — Z5181 Encounter for therapeutic drug level monitoring: Secondary | ICD-10-CM

## 2016-07-16 DIAGNOSIS — Z954 Presence of other heart-valve replacement: Secondary | ICD-10-CM | POA: Diagnosis not present

## 2016-07-16 LAB — CUP PACEART REMOTE DEVICE CHECK
Battery Remaining Longevity: 138 mo
Brady Statistic AP VS Percent: 50 %
Brady Statistic AS VP Percent: 0 %
Implantable Lead Implant Date: 20160301
Implantable Lead Location: 753859
Implantable Lead Model: 5076
Implantable Pulse Generator Implant Date: 20160301
Lead Channel Pacing Threshold Amplitude: 0.625 V
Lead Channel Pacing Threshold Amplitude: 0.625 V
Lead Channel Pacing Threshold Pulse Width: 0.4 ms
Lead Channel Pacing Threshold Pulse Width: 0.4 ms
Lead Channel Setting Pacing Amplitude: 2 V
Lead Channel Setting Sensing Sensitivity: 5.6 mV
MDC IDC LEAD IMPLANT DT: 20160301
MDC IDC LEAD LOCATION: 753860
MDC IDC MSMT BATTERY IMPEDANCE: 159 Ohm
MDC IDC MSMT BATTERY VOLTAGE: 2.79 V
MDC IDC MSMT LEADCHNL RA IMPEDANCE VALUE: 475 Ohm
MDC IDC MSMT LEADCHNL RV IMPEDANCE VALUE: 671 Ohm
MDC IDC SESS DTM: 20180425115026
MDC IDC SET LEADCHNL RV PACING AMPLITUDE: 2.5 V
MDC IDC SET LEADCHNL RV PACING PULSEWIDTH: 0.4 ms
MDC IDC STAT BRADY AP VP PERCENT: 0 %
MDC IDC STAT BRADY AS VS PERCENT: 50 %

## 2016-07-16 LAB — POCT INR: INR: 3.9

## 2016-07-17 ENCOUNTER — Encounter: Payer: Self-pay | Admitting: Cardiology

## 2016-08-03 ENCOUNTER — Ambulatory Visit (INDEPENDENT_AMBULATORY_CARE_PROVIDER_SITE_OTHER): Payer: PPO | Admitting: *Deleted

## 2016-08-03 DIAGNOSIS — I5033 Acute on chronic diastolic (congestive) heart failure: Secondary | ICD-10-CM

## 2016-08-03 DIAGNOSIS — I4891 Unspecified atrial fibrillation: Secondary | ICD-10-CM

## 2016-08-03 DIAGNOSIS — I050 Rheumatic mitral stenosis: Secondary | ICD-10-CM | POA: Diagnosis not present

## 2016-08-03 DIAGNOSIS — Z954 Presence of other heart-valve replacement: Secondary | ICD-10-CM | POA: Diagnosis not present

## 2016-08-03 DIAGNOSIS — Z5181 Encounter for therapeutic drug level monitoring: Secondary | ICD-10-CM

## 2016-08-03 LAB — POCT INR: INR: 2.7

## 2016-08-17 ENCOUNTER — Other Ambulatory Visit: Payer: Self-pay | Admitting: Cardiology

## 2016-08-18 ENCOUNTER — Ambulatory Visit (INDEPENDENT_AMBULATORY_CARE_PROVIDER_SITE_OTHER): Payer: PPO | Admitting: *Deleted

## 2016-08-18 DIAGNOSIS — I4891 Unspecified atrial fibrillation: Secondary | ICD-10-CM

## 2016-08-18 DIAGNOSIS — Z5181 Encounter for therapeutic drug level monitoring: Secondary | ICD-10-CM

## 2016-08-18 DIAGNOSIS — I5033 Acute on chronic diastolic (congestive) heart failure: Secondary | ICD-10-CM | POA: Diagnosis not present

## 2016-08-18 DIAGNOSIS — Z954 Presence of other heart-valve replacement: Secondary | ICD-10-CM | POA: Diagnosis not present

## 2016-08-18 DIAGNOSIS — I050 Rheumatic mitral stenosis: Secondary | ICD-10-CM

## 2016-08-18 LAB — POCT INR: INR: 1.9

## 2016-08-30 NOTE — Progress Notes (Deleted)
Pre visit review using our clinic review tool, if applicable. No additional management support is needed unless otherwise documented below in the visit note. 

## 2016-08-30 NOTE — Progress Notes (Deleted)
Subjective:   Kevin English is a 61 y.o. male who presents for an Initial Medicare Annual Wellness Visit.  Review of Systems  No ROS.  Medicare Wellness Visit.    Sleep patterns: {SX; SLEEP PATTERNS:18802::"feels rested on waking","does not get up to void","gets up *** times nightly to void","sleeps *** hours nightly"}.   Home Safety/Smoke Alarms: Feels safe in home. Smoke alarms in place.  Living environment; residence and Firearm Safety: {Rehab home environment / accessibility:30080::"no firearms","firearms stored safely"}. Seat Belt Safety/Bike Helmet: Wears seat belt.   Counseling:   Eye Exam-  Dental-  Male:   CCS- none    PSA- No results found for: PSA    Objective:    There were no vitals filed for this visit. There is no height or weight on file to calculate BMI.  Current Medications (verified) Outpatient Encounter Prescriptions as of 08/31/2016  Medication Sig  . acetaminophen (TYLENOL) 325 MG tablet Take 325 mg by mouth every 6 (six) hours as needed (pain).   Marland Kitchen ALPRAZolam (XANAX) 0.5 MG tablet Take 1-2 tablets (0.5-1 mg total) by mouth at bedtime as needed for anxiety or sleep.  Marland Kitchen amLODipine (NORVASC) 10 MG tablet TAKE 1 TABLET (10 MG TOTAL) BY MOUTH DAILY.  Marland Kitchen aspirin EC 81 MG tablet Take 1 tablet (81 mg total) by mouth daily.  . fluticasone (FLONASE) 50 MCG/ACT nasal spray Place 2 sprays into both nostrils daily.  Marland Kitchen levothyroxine (SYNTHROID, LEVOTHROID) 75 MCG tablet Take 1 tablet (75 mcg total) by mouth daily before breakfast.  . loratadine (CLARITIN) 10 MG tablet Take 1 tablet (10 mg total) by mouth daily.  . metoprolol (LOPRESSOR) 50 MG tablet Take 1 tablet (50 mg total) by mouth 2 (two) times daily.  Marland Kitchen omega-3 acid ethyl esters (LOVAZA) 1 g capsule Take 2 capsules (2 g total) by mouth 2 (two) times daily.  . sertraline (ZOLOFT) 100 MG tablet Take 100 mg by mouth daily.   . Tiotropium Bromide-Olodaterol (STIOLTO RESPIMAT) 2.5-2.5 MCG/ACT AERS Inhale 2 puffs  into the lungs daily.  Marland Kitchen warfarin (COUMADIN) 5 MG tablet TAKE AS DIRECTED BY ANTICOAGULATION CLINIC   No facility-administered encounter medications on file as of 08/31/2016.     Allergies (verified) Contrast media [iodinated diagnostic agents]; Ioxaglate; Atorvastatin; Crestor [rosuvastatin]; Fenofibrate; and Pravastatin   History: Past Medical History:  Diagnosis Date  . Anxiety   . Borderline diabetes   . CAD (coronary artery disease)    a. By cath 08/2013: 70% distal cx-prox LPDA; tandem mod LAD lesions, o/w mild dz.  . CKD (chronic kidney disease), stage II    his creat. runs 2-2.5; hasnt seen neph yet - sees dr. Tresa Moore at [facility]  . Complete heart block (HCC)    a. s/p PPM   . Deafness in left ear   . Depression   . Diverticulitis of colon 15 years ago  . Hernia   . HTN (hypertension)   . Hx of echocardiogram    post MVR >> Echo 4/16:  Mild LVH, EF 45-50%, apical dyskinesis, trivial AI, mechanical MVR functioning appropriately, severe LAE  . Hyperlipidemia   . Hypogonadism male   . Hypothyroidism   . Lung nodule seen on imaging study 04/10/2014   5 mm nodule right lower lobe  . Mitral stenosis    a. Dx 07/2013 - through workup of 2D echo, TEE, and cath, felt to be moderate.  . Obesity   . OSA on CPAP   . Persistent atrial fibrillation ([place])  a. sustained 160-180 on e-CARDIO monitor placed 07/29/2013. Placed on IV amiodarone at end of May 2015 due to continued paroxysms with RVR.  Marland Kitchen Renal cell carcinoma of right kidney ([place]) 09/05/2013   clear cell renal cell carcinoma of right kidney treated by radical nephrectomy, Fuhrman grade 2-3, with maximum tumor diameter 2.7 cm, all tumor to find confined to the kidney, and all surgical margins negative (T1aN0).   . Renal mass    a. Dx 08/2013: concerning for renal cancer.  . Rheumatic heart disease   . S/P Minimally invasive maze operation for atrial fibrillation 05/15/2014   Left side lesion set using cryothermy with clipping of  LA appendage  . S/P minimally invasive mitral valve replacement with metallic valve and maze procedure 05/15/2014   9mm Sorin Carbomedics Optiform mechanical valve placed via right mini thoracotomy approach  . Stroke [facility]) 1998   "MRI showed 3 mild strokes"   Past Surgical History:  Procedure Laterality Date  . CARDIAC CATHETERIZATION  08/28/13  . EYE SURGERY Right 1990   "reconstructive, lens implant- from paintball accident"  . INGUINAL HERNIA REPAIR Bilateral first one in 80's   "I had one side done twice"  . LEFT AND RIGHT HEART CATHETERIZATION WITH CORONARY ANGIOGRAM N/A 08/28/2013   Procedure: LEFT AND RIGHT HEART CATHETERIZATION WITH CORONARY ANGIOGRAM;  Surgeon: Leonie Man, MD;  Location: [facility] CATH LAB;  Service: Cardiovascular;  Laterality: N/A;  . MINIMALLY INVASIVE MAZE PROCEDURE N/A 05/15/2014   Procedure: MINIMALLY INVASIVE MAZE PROCEDURE;  Surgeon: Rexene Alberts, MD;  Location: [place];  Service: Open Heart Surgery;  Laterality: N/A;  . MITRAL VALVE REPLACEMENT Right 05/15/2014   Procedure: MINIMALLY INVASIVE MITRAL VALVE (MV) REPLACEMENT;  Surgeon: Rexene Alberts, MD;  Location: [place];  Service: Open Heart Surgery;  Laterality: Right;  . PERMANENT PACEMAKER INSERTION Left 05/22/2014   Procedure: PERMANENT PACEMAKER INSERTION;  Surgeon: Thompson Grayer, MD;  Location: [facility] CATH LAB;  Service: Cardiovascular;  Laterality: Left;  . ROBOT ASSISTED LAPAROSCOPIC NEPHRECTOMY Right 09/05/2013   Procedure: ROBOTIC ASSISTED LAPAROSCOPIC RIGHT RADICAL NEPHRECTOMY;  Surgeon: Sharyn Creamer, MD;  [facility];  Service: Urology;  Laterality: Right;  . TEE WITHOUT CARDIOVERSION N/A 08/16/2013   Procedure: TRANSESOPHAGEAL ECHOCARDIOGRAM (TEE);  Surgeon: Dorothy Spark, MD;  Location: [place];  Service: Cardiovascular;  Laterality: N/A;  . TEE WITHOUT CARDIOVERSION N/A 05/15/2014   Procedure: TRANSESOPHAGEAL ECHOCARDIOGRAM (TEE);  Surgeon: Rexene Alberts, MD;  Location: [place];  Service:  Open Heart Surgery;  Laterality: N/A;   Family History  Problem Relation Age of Onset  . Hypertension Mother   . Heart attack Mother   . Stroke Mother   . Hypertension Unknown    Social History   Occupational History  . Retired     Social History Main Topics  . Smoking status: Former Smoker    Packs/day: 0.25    Years: 40.00    Types: Cigarettes, Cigars    Quit date: 03/02/2016  . Smokeless tobacco: Never Used     Comment: Cigar every now and then  . Alcohol use No  . Drug use: No  . Sexual activity: Not Currently   Tobacco Counseling Counseling given: Not Answered   Activities of Daily Living In your present state of health, do you have any difficulty performing the following activities: 03/03/2016 01/09/2016  Hearing? - N  Vision? - N  Difficulty concentrating or making decisions? - N  Walking or climbing stairs? - N  Dressing  or bathing? - N  Doing errands, shopping? N -  Some recent data might be hidden    Immunizations and Health Maintenance  There is no immunization history on file for this patient. Health Maintenance Due  Topic Date Due  . HIV Screening  05/17/1968  . TETANUS/TDAP  05/17/1972  . COLONOSCOPY  05/18/2003    Patient Care Team: [place], FNP as PCP - General (Family Medicine)  Indicate any recent Medical Services you may have received from other than [facility] providers in the past year (date may be approximate).    Assessment:   This is a routine wellness examination for [employer]. Physical assessment deferred to PCP.   Hearing/Vision screen No exam data present  Dietary issues and exercise activities discussed:   Diet (meal preparation, eat out, water intake, caffeinated beverages, dairy products, fruits and vegetables): {Desc; diets:16563} Breakfast: Lunch:  Dinner:      Goals    None     Depression Screen No flowsheet data found.  Fall Risk Fall Risk  03/05/2016  Falls in the past year? No    Cognitive  Function:        Screening Tests Health Maintenance  Topic Date Due  . HIV Screening  05/17/1968  . TETANUS/TDAP  05/17/1972  . COLONOSCOPY  05/18/2003  . INFLUENZA VACCINE  10/21/2016  . Hepatitis C Screening  Completed        Plan:     I have personally reviewed and noted the following in the patient's chart:   . Medical and social history . Use of alcohol, tobacco or illicit drugs  . Current medications and supplements . Functional ability and status . Nutritional status . Physical activity . Advanced directives . List of other physicians . Vitals . Screenings to include cognitive, depression, and falls . Referrals and appointments  In addition, I have reviewed and discussed with patient certain preventive protocols, quality metrics, and best practice recommendations. A written personalized care plan for preventive services as well as general preventive health recommendations were provided to patient.     Michiel Cowboy, RN   08/30/2016

## 2016-08-31 ENCOUNTER — Ambulatory Visit (INDEPENDENT_AMBULATORY_CARE_PROVIDER_SITE_OTHER): Payer: PPO | Admitting: *Deleted

## 2016-08-31 ENCOUNTER — Ambulatory Visit: Payer: PPO

## 2016-08-31 DIAGNOSIS — Z954 Presence of other heart-valve replacement: Secondary | ICD-10-CM

## 2016-08-31 DIAGNOSIS — I4891 Unspecified atrial fibrillation: Secondary | ICD-10-CM

## 2016-08-31 DIAGNOSIS — Z5181 Encounter for therapeutic drug level monitoring: Secondary | ICD-10-CM | POA: Diagnosis not present

## 2016-08-31 DIAGNOSIS — I5033 Acute on chronic diastolic (congestive) heart failure: Secondary | ICD-10-CM

## 2016-08-31 DIAGNOSIS — I050 Rheumatic mitral stenosis: Secondary | ICD-10-CM

## 2016-08-31 LAB — POCT INR: INR: 2.1

## 2016-09-02 ENCOUNTER — Other Ambulatory Visit: Payer: Self-pay | Admitting: Family

## 2016-09-02 DIAGNOSIS — F411 Generalized anxiety disorder: Secondary | ICD-10-CM

## 2016-09-03 NOTE — Telephone Encounter (Signed)
Please advise in Jennings absence, pt last ov with Marya Amsler was 02/2016, no future appt made.

## 2016-09-14 ENCOUNTER — Ambulatory Visit (INDEPENDENT_AMBULATORY_CARE_PROVIDER_SITE_OTHER): Payer: PPO | Admitting: *Deleted

## 2016-09-14 DIAGNOSIS — Z5181 Encounter for therapeutic drug level monitoring: Secondary | ICD-10-CM | POA: Diagnosis not present

## 2016-09-14 DIAGNOSIS — Z954 Presence of other heart-valve replacement: Secondary | ICD-10-CM

## 2016-09-14 DIAGNOSIS — I050 Rheumatic mitral stenosis: Secondary | ICD-10-CM

## 2016-09-14 DIAGNOSIS — I5033 Acute on chronic diastolic (congestive) heart failure: Secondary | ICD-10-CM

## 2016-09-14 DIAGNOSIS — I4891 Unspecified atrial fibrillation: Secondary | ICD-10-CM | POA: Diagnosis not present

## 2016-09-14 LAB — POCT INR: INR: 2.6

## 2016-10-03 ENCOUNTER — Other Ambulatory Visit: Payer: Self-pay | Admitting: Cardiology

## 2016-10-05 ENCOUNTER — Telehealth: Payer: Self-pay | Admitting: Family

## 2016-10-05 NOTE — Telephone Encounter (Signed)
Spoke with pt regarding AWV. Pt is still within his first 12 months of Medicare and is eligible to receive Welcome to Medicare Visit. Informed pt of his eligibility. Pt stated that he wanted to cx the AWV scheduled for 10/06/2016 and he did not want to schedule Welcome to Medicare Visit at this time. Pt stated that he will schedule AWV next year.

## 2016-10-06 ENCOUNTER — Ambulatory Visit (INDEPENDENT_AMBULATORY_CARE_PROVIDER_SITE_OTHER): Payer: PPO

## 2016-10-06 ENCOUNTER — Telehealth: Payer: Self-pay | Admitting: *Deleted

## 2016-10-06 ENCOUNTER — Ambulatory Visit: Payer: PPO

## 2016-10-06 DIAGNOSIS — I050 Rheumatic mitral stenosis: Secondary | ICD-10-CM | POA: Diagnosis not present

## 2016-10-06 DIAGNOSIS — I5033 Acute on chronic diastolic (congestive) heart failure: Secondary | ICD-10-CM

## 2016-10-06 DIAGNOSIS — Z5181 Encounter for therapeutic drug level monitoring: Secondary | ICD-10-CM | POA: Diagnosis not present

## 2016-10-06 DIAGNOSIS — Z954 Presence of other heart-valve replacement: Secondary | ICD-10-CM

## 2016-10-06 DIAGNOSIS — I4891 Unspecified atrial fibrillation: Secondary | ICD-10-CM

## 2016-10-06 LAB — POCT INR: INR: 2.1

## 2016-10-06 NOTE — Telephone Encounter (Signed)
Pt here for  Coumadin clinic appt today and on way out stopped at check out and c/o not feeling well .  Spoke to pt only complaint is fatigue b/p was 120/82 and hr 72 last labs were okay  last tsh was 11-17 and was normal. Pt to have device check next week .Discussed with Dr Lovena Le pt can get device checked as planned and  f/u with  Dr [name] .Pt also stated had stopped Zoloft.encouraged pt to f/u with pmd to eval depression  ./cy

## 2016-10-07 DIAGNOSIS — R81 Glycosuria: Secondary | ICD-10-CM | POA: Diagnosis not present

## 2016-10-07 DIAGNOSIS — R32 Unspecified urinary incontinence: Secondary | ICD-10-CM | POA: Diagnosis not present

## 2016-10-08 ENCOUNTER — Ambulatory Visit (INDEPENDENT_AMBULATORY_CARE_PROVIDER_SITE_OTHER): Payer: PPO | Admitting: Family

## 2016-10-08 ENCOUNTER — Encounter: Payer: Self-pay | Admitting: Family

## 2016-10-08 VITALS — BP 124/80 | HR 66 | Temp 97.8°F | Resp 16 | Ht 68.0 in | Wt 202.0 lb

## 2016-10-08 DIAGNOSIS — E119 Type 2 diabetes mellitus without complications: Secondary | ICD-10-CM | POA: Diagnosis not present

## 2016-10-08 DIAGNOSIS — N183 Chronic kidney disease, stage 3 unspecified: Secondary | ICD-10-CM | POA: Insufficient documentation

## 2016-10-08 DIAGNOSIS — E1122 Type 2 diabetes mellitus with diabetic chronic kidney disease: Secondary | ICD-10-CM | POA: Insufficient documentation

## 2016-10-08 LAB — BASIC METABOLIC PANEL
BUN: 19 (ref 4–21)
CREATININE: 1.2 (ref 0.6–1.3)
Glucose: 464
POTASSIUM: 4.5 (ref 3.4–5.3)
SODIUM: 134 — AB (ref 137–147)

## 2016-10-08 LAB — HEPATIC FUNCTION PANEL
ALT: 29 (ref 10–40)
AST: 23 (ref 14–40)
Alkaline Phosphatase: 129 — AB (ref 25–125)
Bilirubin, Total: 0.8

## 2016-10-08 LAB — POCT GLYCOSYLATED HEMOGLOBIN (HGB A1C): Hemoglobin A1C: 10.6

## 2016-10-08 MED ORDER — METFORMIN HCL 500 MG PO TABS: 500.0000 mg | ORAL_TABLET | Freq: Two times a day (BID) | ORAL | 1 refills | Status: DC

## 2016-10-08 NOTE — Assessment & Plan Note (Signed)
In office A1c of 10.6 indicating newly diagnosed type 2 diabetes. Previous kidney function reviewed with stable GFR of 62. Start metformin. Proper medication administration and side effects discussed. Information on diabetes and nutrition provided and after visit summary. Follow-up in 2 weeks for diabetic prevention and referral to diabetic education.

## 2016-10-08 NOTE — Progress Notes (Signed)
Subjective:    Patient ID: Kevin English, male    DOB: 1955/07/03, 61 y.o.   MRN: [id number]  Chief Complaint  Patient presents with  . Hyperglycemia    went to [facility] urology yesterday bc he was not feeling well and thought it was UTI, they told him his sugar was high and he needed to be seen.     HPI:  Kevin English is a 61 y.o. male who  has a past medical history of Anxiety; Borderline diabetes; CAD (coronary artery disease); CKD (chronic kidney disease), stage II; Complete heart block ([place]); Deafness in left ear; Depression; Diverticulitis of colon (15 years ago); Hernia; HTN (hypertension); echocardiogram; Hyperlipidemia; Hypogonadism male; Hypothyroidism; Lung nodule seen on imaging study (04/10/2014); Mitral stenosis; Obesity; OSA on CPAP; Persistent atrial fibrillation ([place]); Renal cell carcinoma of right kidney (Kevin English) (09/05/2013); Renal mass; Rheumatic heart disease; S/P Minimally invasive maze operation for atrial fibrillation (05/15/2014); S/P minimally invasive mitral valve replacement with metallic valve and maze procedure (05/15/2014); and Stroke (Kevin English) (1998). and presents today for an office visit.  This is a new problem. Associated symptoms of fatigue, decreased energy, increased hunger and frequent urination has been going on for about 3 months. He was seen at the Urology office for a potential UTI and was noted to have a blood sugar of 464 and advised to follow up with primary care. Review of chart shows previous A1c of 6.5. Severity of his fatigue is moderate and does effect his desire to do things since he retired. Modifying factors include Zoloft which he questions if it is helping.    Allergies  Allergen Reactions  . Contrast Media [Iodinated Diagnostic Agents] Hives and Other (See Comments)    Patient started sneezing and coughing, patient broke out in hives, patient needs 13 hour prep if having IV contrast  . Ioxaglate Hives and Rash    Patient started sneezing and  coughing, patient broke out in hives, patient needs 13 hour prep if having IV contrast  . Atorvastatin Other (See Comments)    Myalgias  . Crestor [Rosuvastatin] Other (See Comments)    Myalgias  . Fenofibrate Other (See Comments)    MYALGIAS   . Pravastatin Other (See Comments)    Myalgias       Outpatient Medications Prior to Visit  Medication Sig Dispense Refill  . acetaminophen (TYLENOL) 325 MG tablet Take 325 mg by mouth every 6 (six) hours as needed (pain).     Marland Kitchen ALPRAZolam (XANAX) 0.5 MG tablet TAKE 1 TO 2 TABLETS BY MOUTH AT BEDTIME AS NEEDED FOR ANXIETY OR SLEEP 90 tablet 0  . amLODipine (NORVASC) 10 MG tablet TAKE 1 TABLET (10 MG TOTAL) BY MOUTH DAILY. 90 tablet 2  . aspirin EC 81 MG tablet Take 1 tablet (81 mg total) by mouth daily.    . fluticasone (FLONASE) 50 MCG/ACT nasal spray English 2 sprays into both nostrils daily.  2  . levothyroxine (SYNTHROID, LEVOTHROID) 75 MCG tablet Take 1 tablet (75 mcg total) by mouth daily before breakfast. 90 tablet 3  . loratadine (CLARITIN) 10 MG tablet Take 1 tablet (10 mg total) by mouth daily.    Marland Kitchen omega-3 acid ethyl esters (LOVAZA) 1 g capsule Take 2 capsules (2 g total) by mouth 2 (two) times daily. 360 capsule 2  . sertraline (ZOLOFT) 100 MG tablet Take 100 mg by mouth daily.     Marland Kitchen warfarin (COUMADIN) 5 MG tablet TAKE AS DIRECTED BY ANTICOAGULATION CLINIC 40 tablet  3  . metoprolol (LOPRESSOR) 50 MG tablet Take 1 tablet (50 mg total) by mouth 2 (two) times daily. 180 tablet 3  . Tiotropium Bromide-Olodaterol (STIOLTO RESPIMAT) 2.5-2.5 MCG/ACT AERS Inhale 2 puffs into the lungs daily. 4 g 4   No facility-administered medications prior to visit.       Past Surgical History:  Procedure Laterality Date  . CARDIAC CATHETERIZATION  08/28/13  . EYE SURGERY Right 1990   "reconstructive, lens implant- from paintball accident"  . INGUINAL HERNIA REPAIR Bilateral first one in 80's   "I had one side done twice"  . LEFT AND RIGHT HEART  CATHETERIZATION WITH CORONARY ANGIOGRAM N/A 08/28/2013   Procedure: LEFT AND RIGHT HEART CATHETERIZATION WITH CORONARY ANGIOGRAM;  Surgeon: Leonie Man, MD;  Location: [facility] CATH LAB;  Service: Cardiovascular;  Laterality: N/A;  . MINIMALLY INVASIVE MAZE PROCEDURE N/A 05/15/2014   Procedure: MINIMALLY INVASIVE MAZE PROCEDURE;  Surgeon: Rexene Alberts, MD;  Location: [place];  Service: Open Heart Surgery;  Laterality: N/A;  . MITRAL VALVE REPLACEMENT Right 05/15/2014   Procedure: MINIMALLY INVASIVE MITRAL VALVE (MV) REPLACEMENT;  Surgeon: Rexene Alberts, MD;  Location: [place];  Service: Open Heart Surgery;  Laterality: Right;  . PERMANENT PACEMAKER INSERTION Left 05/22/2014   Procedure: PERMANENT PACEMAKER INSERTION;  Surgeon: Thompson Grayer, MD;  Location: [facility] CATH LAB;  Service: Cardiovascular;  Laterality: Left;  . ROBOT ASSISTED LAPAROSCOPIC NEPHRECTOMY Right 09/05/2013   Procedure: ROBOTIC ASSISTED LAPAROSCOPIC RIGHT RADICAL NEPHRECTOMY;  Surgeon: Sharyn Creamer, MD;  Location: [facility];  Service: Urology;  Laterality: Right;  . TEE WITHOUT CARDIOVERSION N/A 08/16/2013   Procedure: TRANSESOPHAGEAL ECHOCARDIOGRAM (TEE);  Surgeon: Dorothy Spark, MD;  Location: [place];  Service: Cardiovascular;  Laterality: N/A;  . TEE WITHOUT CARDIOVERSION N/A 05/15/2014   Procedure: TRANSESOPHAGEAL ECHOCARDIOGRAM (TEE);  Surgeon: Rexene Alberts, MD;  Location: [place];  Service: Open Heart Surgery;  Laterality: N/A;      Past Medical History:  Diagnosis Date  . Anxiety   . Borderline diabetes   . CAD (coronary artery disease)    a. By cath 08/2013: 70% distal cx-prox LPDA; tandem mod LAD lesions, o/w mild dz.  . CKD (chronic kidney disease), stage II    his creat. runs 2-2.5; hasnt seen neph yet - sees dr. Tresa Moore at [facility]  . Complete heart block (H[facility])    a. s/p PPM   . Deafness in left ear   . Depression   . Diverticulitis of colon 15 years ago  . Hernia   . HTN (hypertension)   . Hx of  echocardiogram    post MVR >> Echo 4/16:  Mild LVH, EF 45-50%, apical dyskinesis, trivial AI, mechanical MVR functioning appropriately, severe LAE  . Hyperlipidemia   . Hypogonadism male   . Hypothyroidism   . Lung nodule seen on imaging study 04/10/2014   5 mm nodule right lower lobe  . Mitral stenosis    a. Dx 07/2013 - through workup of 2D echo, TEE, and cath, felt to be moderate.  . Obesity   . OSA on CPAP   . Persistent atrial fibrillation ([place])    a. sustained 160-180 on e-CARDIO monitor placed 07/29/2013. Placed on IV amiodarone at end of May 2015 due to continued paroxysms with RVR.  Marland Kitchen Renal cell carcinoma of right kidney ([place]) 09/05/2013   clear cell renal cell carcinoma of right kidney treated by radical nephrectomy, Fuhrman grade 2-3, with maximum tumor diameter 2.7 cm, all tumor  to find confined to the kidney, and all surgical margins negative (T1aN0).   . Renal mass    a. Dx 08/2013: concerning for renal cancer.  . Rheumatic heart disease   . S/P Minimally invasive maze operation for atrial fibrillation 05/15/2014   Left side lesion set using cryothermy with clipping of LA appendage  . S/P minimally invasive mitral valve replacement with metallic valve and maze procedure 05/15/2014   59mm Sorin Carbomedics Optiform mechanical valve placed via right mini thoracotomy approach  . Stroke [facility]) 1998   "MRI showed 3 mild strokes"      Review of Systems  Constitutional: Positive for fatigue. Negative for appetite change, chills, fever and unexpected weight change.  Respiratory: Negative for chest tightness and shortness of breath.   Endocrine: Positive for polyphagia and polyuria.  Neurological: Negative for weakness and numbness.  Psychiatric/Behavioral: Positive for dysphoric mood.      Objective:    BP 124/80 (BP Location: Left Arm, Patient Position: Sitting, Cuff Size: Large)   Pulse 66   Temp 97.8 F (36.6 C) (Oral)   Resp 16   Ht 5\' 8"  (1.727 m)   Wt 202 lb (91.6 kg)    SpO2 98%   BMI 30.71 kg/m  Nursing note and vital signs reviewed.  Physical Exam  Constitutional: He is oriented to person, English, and time. He appears well-developed and well-nourished. No distress.  Cardiovascular: Normal rate, regular rhythm, normal heart sounds and intact distal pulses.   Pulmonary/Chest: Effort normal and breath sounds normal.  Neurological: He is alert and oriented to person, English, and time.  Skin: Skin is warm and dry.  Psychiatric: He has a normal mood and affect. His behavior is normal. Judgment and thought content normal.       Assessment & Plan:   Problem List Items Addressed This Visit      Endocrine   Type 2 diabetes mellitus without complication, without long-term current use of insulin ([place]) - Primary    In office A1c of 10.6 indicating newly diagnosed type 2 diabetes. Previous kidney function reviewed with stable GFR of 62. Start metformin. Proper medication administration and side effects discussed. Information on diabetes and nutrition provided and after visit summary. Follow-up in 2 weeks for diabetic prevention and referral to diabetic education.       Relevant Medications   metFORMIN (GLUCOPHAGE) 500 MG tablet   Other Relevant Orders   POCT HgB A1C (Completed)       I have discontinued Mr. Banfill Tiotropium Bromide-Olodaterol. I am also having him start on metFORMIN. Additionally, I am having him maintain his acetaminophen, aspirin EC, fluticasone, loratadine, metoprolol tartrate, levothyroxine, sertraline, amLODipine, omega-3 acid ethyl esters, ALPRAZolam, and warfarin.   Meds ordered this encounter  Medications  . metFORMIN (GLUCOPHAGE) 500 MG tablet    Sig: Take 1 tablet (500 mg total) by mouth 2 (two) times daily with a meal.    Dispense:  60 tablet    Refill:  1    Order Specific Question:   Supervising Provider    Answer:   Pricilla Holm A [[id number]]     Follow-up: Return in about 2 weeks (around 10/22/2016), or if  symptoms worsen or fail to improve.  Mauricio Po, FNP

## 2016-10-08 NOTE — Patient Instructions (Addendum)
Thank you for choosing [facility].  SUMMARY AND INSTRUCTIONS:  Medication:  Your prescription(s) have been submitted to your pharmacy or been printed and provided for you. Please take as directed and contact our office if you believe you are having problem(s) with the medication(s) or have any questions.  Follow up:  If your symptoms worsen or fail to improve, please contact our office for further instruction, or in case of emergency go directly to the emergency room at the closest medical facility.   Diabetes Mellitus and Food It is important for you to manage your blood sugar (glucose) level. Your blood glucose level can be greatly affected by what you eat. Eating healthier foods in the appropriate amounts throughout the day at about the same time each day will help you control your blood glucose level. It can also help slow or prevent worsening of your diabetes mellitus. Healthy eating may even help you improve the level of your blood pressure and reach or maintain a healthy weight. General recommendations for healthful eating and cooking habits include:  Eating meals and snacks regularly. Avoid going long periods of time without eating to lose weight.  Eating a diet that consists mainly of plant-based foods, such as fruits, vegetables, nuts, legumes, and whole grains.  Using low-heat cooking methods, such as baking, instead of high-heat cooking methods, such as deep frying.  Work with your dietitian to make sure you understand how to use the Nutrition Facts information on food labels. How can food affect me? Carbohydrates Carbohydrates affect your blood glucose level more than any other type of food. Your dietitian will help you determine how many carbohydrates to eat at each meal and teach you how to count carbohydrates. Counting carbohydrates is important to keep your blood glucose at a healthy level, especially if you are using insulin or taking certain medicines for diabetes  mellitus. Alcohol Alcohol can cause sudden decreases in blood glucose (hypoglycemia), especially if you use insulin or take certain medicines for diabetes mellitus. Hypoglycemia can be a life-threatening condition. Symptoms of hypoglycemia (sleepiness, dizziness, and disorientation) are similar to symptoms of having too much alcohol. If your health care provider has given you approval to drink alcohol, do so in moderation and use the following guidelines:  Women should not have more than one drink per day, and men should not have more than two drinks per day. One drink is equal to: ? 12 oz of beer. ? 5 oz of wine. ? 1 oz of hard liquor.  Do not drink on an empty stomach.  Keep yourself hydrated. Have water, diet soda, or unsweetened iced tea.  Regular soda, juice, and other mixers might contain a lot of carbohydrates and should be counted.  What foods are not recommended? As you make food choices, it is important to remember that all foods are not the same. Some foods have fewer nutrients per serving than other foods, even though they might have the same number of calories or carbohydrates. It is difficult to get your body what it needs when you eat foods with fewer nutrients. Examples of foods that you should avoid that are high in calories and carbohydrates but low in nutrients include:  Trans fats (most processed foods list trans fats on the Nutrition Facts label).  Regular soda.  Juice.  Candy.  Sweets, such as cake, pie, doughnuts, and cookies.  Fried foods.  What foods can I eat? Eat nutrient-rich foods, which will nourish your body and keep you healthy. The food  you should eat also will depend on several factors, including:  The calories you need.  The medicines you take.  Your weight.  Your blood glucose level.  Your blood pressure level.  Your cholesterol level.  You should eat a variety of foods, including:  Protein. ? Lean cuts of meat. ? Proteins low in  saturated fats, such as fish, egg whites, and beans. Avoid processed meats.  Fruits and vegetables. ? Fruits and vegetables that may help control blood glucose levels, such as apples, mangoes, and yams.  Dairy products. ? Choose fat-free or low-fat dairy products, such as milk, yogurt, and cheese.  Grains, bread, pasta, and rice. ? Choose whole grain products, such as multigrain bread, whole oats, and brown rice. These foods may help control blood pressure.  Fats. ? Foods containing healthful fats, such as nuts, avocado, olive oil, canola oil, and fish.  Does everyone with diabetes mellitus have the same meal plan? Because every person with diabetes mellitus is different, there is not one meal plan that works for everyone. It is very important that you meet with a dietitian who will help you create a meal plan that is just right for you. This information is not intended to replace advice given to you by your health care provider. Make sure you discuss any questions you have with your health care provider. Document Released: 12/04/2004 Document Revised: 08/15/2015 Document Reviewed: 02/03/2013 Elsevier Interactive Patient Education  2017 [employer].

## 2016-10-14 ENCOUNTER — Ambulatory Visit (INDEPENDENT_AMBULATORY_CARE_PROVIDER_SITE_OTHER): Payer: PPO | Admitting: *Deleted

## 2016-10-14 DIAGNOSIS — I495 Sick sinus syndrome: Secondary | ICD-10-CM | POA: Diagnosis not present

## 2016-10-14 NOTE — Progress Notes (Signed)
Remote pacemaker transmission.   

## 2016-10-15 ENCOUNTER — Encounter: Payer: Self-pay | Admitting: Cardiology

## 2016-10-20 ENCOUNTER — Encounter: Payer: Self-pay | Admitting: Family

## 2016-10-20 NOTE — Progress Notes (Unsigned)
Results entered and sent to scan  

## 2016-10-22 ENCOUNTER — Encounter: Payer: Self-pay | Admitting: Family

## 2016-10-22 ENCOUNTER — Ambulatory Visit (INDEPENDENT_AMBULATORY_CARE_PROVIDER_SITE_OTHER): Payer: PPO | Admitting: Family

## 2016-10-22 ENCOUNTER — Ambulatory Visit (INDEPENDENT_AMBULATORY_CARE_PROVIDER_SITE_OTHER): Payer: PPO | Admitting: Pharmacist

## 2016-10-22 VITALS — BP 132/88 | HR 60 | Temp 97.9°F | Resp 16 | Ht 68.0 in | Wt 201.8 lb

## 2016-10-22 DIAGNOSIS — I4891 Unspecified atrial fibrillation: Secondary | ICD-10-CM | POA: Diagnosis not present

## 2016-10-22 DIAGNOSIS — I050 Rheumatic mitral stenosis: Secondary | ICD-10-CM | POA: Diagnosis not present

## 2016-10-22 DIAGNOSIS — I5033 Acute on chronic diastolic (congestive) heart failure: Secondary | ICD-10-CM | POA: Diagnosis not present

## 2016-10-22 DIAGNOSIS — E119 Type 2 diabetes mellitus without complications: Secondary | ICD-10-CM

## 2016-10-22 DIAGNOSIS — Z5181 Encounter for therapeutic drug level monitoring: Secondary | ICD-10-CM | POA: Diagnosis not present

## 2016-10-22 DIAGNOSIS — Z954 Presence of other heart-valve replacement: Secondary | ICD-10-CM

## 2016-10-22 LAB — POCT INR: INR: 2.7

## 2016-10-22 MED ORDER — ONETOUCH SURESOFT LANCING DEV MISC: 1 refills | Status: DC

## 2016-10-22 MED ORDER — ONETOUCH ULTRA MINI W/DEVICE KIT: PACK | 0 refills | Status: DC

## 2016-10-22 MED ORDER — GLUCOSE BLOOD VI STRP: ORAL_STRIP | 12 refills | Status: DC

## 2016-10-22 NOTE — Patient Instructions (Addendum)
Thank you for choosing [facility].  SUMMARY AND INSTRUCTIONS:  Please continue to take your medication as prescribed.   You will receive a call to schedule your diabetes education center.   Please check your blood sugars at least 1x per day before you eat in the morning.   Follow up:  If your symptoms worsen or fail to improve, please contact our office for further instruction, or in case of emergency go directly to the emergency room at the closest medical facility.   DIABETES CARE INSTRUCTIONS:  Current A1c:  Lab Results  Component Value Date   HGBA1C 10.6 10/08/2016    A1C Goal: <7.0%  Fasting Blood Sugar Goal: 80-130 Blood sugar check that you take after fasting for at least 8 hours which is usually in the morning before eating.  Post-prandial Blood Sugar Goal: <180 Blood sugar check approximately 1-2 hours after the start of a meal.    Diabetes Prevention Screens:  1.) Ensure you have an annual eye exam by an ophthalmologist or optometrist.   2,) Ensure you have an annual foot exam or when any changes are noted including new onset numbness/tingling or wounds. Check your feet daily!  3.) The American Diabetes Association recommends the Pneumovax vaccination against pneumonia every 5 years.  4.) We will check your kidney function with a urine test on an annual basis.

## 2016-10-22 NOTE — Progress Notes (Signed)
Subjective:    Patient ID: Kevin English, male    DOB: April 08, 1955, 61 y.o.   MRN: [id number]  Chief Complaint  Patient presents with  . Diabetes    HPI:  Kevin English is a 61 y.o. male who  has a past medical history of Anxiety; Borderline diabetes; CAD (coronary artery disease); CKD (chronic kidney disease), stage II; Complete heart block ([place]); Deafness in left ear; Depression; Diverticulitis of colon (15 years ago); Hernia; HTN (hypertension); echocardiogram; Hyperlipidemia; Hypogonadism male; Hypothyroidism; Lung nodule seen on imaging study (04/10/2014); Mitral stenosis; Obesity; OSA on CPAP; Persistent atrial fibrillation ([place]); Renal cell carcinoma of right kidney ([place]) (09/05/2013); Renal mass; Rheumatic heart disease; S/P Minimally invasive maze operation for atrial fibrillation (05/15/2014); S/P minimally invasive mitral valve replacement with metallic valve and maze procedure (05/15/2014); and Stroke ([place]) (1998). and presents today for a follow up office visit.  Diabetes - Previously started on metformin. Reports taking the medication as prescribed and denies adverse side effects. Has not checked blood sugars at home. Does have improvement in his urination and hunger. Has also worked on decreasing his general intake as well as carbohydrates. Does continue to feel some fatigue.   Allergies  Allergen Reactions  . Contrast Media [Iodinated Diagnostic Agents] Hives and Other (See Comments)    Patient started sneezing and coughing, patient broke out in hives, patient needs 13 hour prep if having IV contrast  . Ioxaglate Hives and Rash    Patient started sneezing and coughing, patient broke out in hives, patient needs 13 hour prep if having IV contrast  . Atorvastatin Other (See Comments)    Myalgias  . Crestor [Rosuvastatin] Other (See Comments)    Myalgias  . Fenofibrate Other (See Comments)    MYALGIAS   . Pravastatin Other (See Comments)    Myalgias       Outpatient  Medications Prior to Visit  Medication Sig Dispense Refill  . acetaminophen (TYLENOL) 325 MG tablet Take 325 mg by mouth every 6 (six) hours as needed (pain).     Marland Kitchen ALPRAZolam (XANAX) 0.5 MG tablet TAKE 1 TO 2 TABLETS BY MOUTH AT BEDTIME AS NEEDED FOR ANXIETY OR SLEEP 90 tablet 0  . amLODipine (NORVASC) 10 MG tablet TAKE 1 TABLET (10 MG TOTAL) BY MOUTH DAILY. 90 tablet 2  . aspirin EC 81 MG tablet Take 1 tablet (81 mg total) by mouth daily.    . fluticasone (FLONASE) 50 MCG/ACT nasal spray Place 2 sprays into both nostrils daily.  2  . levothyroxine (SYNTHROID, LEVOTHROID) 75 MCG tablet Take 1 tablet (75 mcg total) by mouth daily before breakfast. 90 tablet 3  . loratadine (CLARITIN) 10 MG tablet Take 1 tablet (10 mg total) by mouth daily.    . metFORMIN (GLUCOPHAGE) 500 MG tablet Take 1 tablet (500 mg total) by mouth 2 (two) times daily with a meal. 60 tablet 1  . omega-3 acid ethyl esters (LOVAZA) 1 g capsule Take 2 capsules (2 g total) by mouth 2 (two) times daily. 360 capsule 2  . sertraline (ZOLOFT) 100 MG tablet Take 100 mg by mouth daily.     Marland Kitchen warfarin (COUMADIN) 5 MG tablet TAKE AS DIRECTED BY ANTICOAGULATION CLINIC 40 tablet 3  . metoprolol (LOPRESSOR) 50 MG tablet Take 1 tablet (50 mg total) by mouth 2 (two) times daily. 180 tablet 3   No facility-administered medications prior to visit.     Review of Systems  Constitutional: Positive for fatigue.  Eyes:  Negative for changes in vision.  Respiratory: Negative for chest tightness and shortness of breath.   Cardiovascular: Negative for chest pain, palpitations and leg swelling.  Endocrine: Negative for polydipsia, polyphagia and polyuria.  Neurological: Negative for dizziness, weakness, light-headedness and headaches.      Objective:    BP 132/88 (BP Location: Left Arm, Patient Position: Sitting, Cuff Size: Large)   Pulse 60   Temp 97.9 F (36.6 C) (Oral)   Resp 16   Ht 5' 8"  (1.727 m)   Wt 201 lb 12.8 oz (91.5 kg)    SpO2 96%   BMI 30.68 kg/m  Nursing note and vital signs reviewed.  Physical Exam  Constitutional: He is oriented to person, place, and time. He appears well-developed and well-nourished. No distress.  Cardiovascular: Normal rate, regular rhythm, normal heart sounds and intact distal pulses.   Pulmonary/Chest: Effort normal and breath sounds normal.  Neurological: He is alert and oriented to person, place, and time.  Skin: Skin is warm and dry.  Psychiatric: He has a normal mood and affect. His behavior is normal. Judgment and thought content normal.       Assessment & Plan:   Problem List Items Addressed This Visit      Endocrine   Type 2 diabetes mellitus without complication, without long-term current use of insulin ([place]) - Primary    New-onset type 2 diabetes with some improvements with starting metformin. No adverse side effects. Continues to experience some fatigue which is most likely related to chronic conditions. Diabetic testing supplies sent to pharmacy with instructions to test blood sugars 1-2 times daily. Diabetic foot exam completed today with instructions for self exam provided to patient. Not currently maintained on an Ace/ARB or statin medication for CAD risk reduction. Diabetic eye exam is up-to-date per recommendations. Referral placed for diabetic education. Continue current dosage of metformin and follow-up A1c in 2 months.      Relevant Orders   Ambulatory referral to diabetic education       I am having Mr. Cicio start on glucose blood, ONE TOUCH ULTRA MINI, and ONE TOUCH SURESOFT. I am also having him maintain his acetaminophen, aspirin EC, fluticasone, loratadine, metoprolol tartrate, levothyroxine, sertraline, amLODipine, omega-3 acid ethyl esters, ALPRAZolam, warfarin, and metFORMIN.   Meds ordered this encounter  Medications  . glucose blood (ONE TOUCH ULTRA TEST) test strip    Sig: Use one strip per test. Test blood sugars 1-4 times daily as  instructed.    Dispense:  100 each    Refill:  12    Substitution permissible per insurance coverage. Dx E11.9.    Order Specific Question:   Supervising Provider    Answer:   Pricilla Holm A [[id number]]  . Blood Glucose Monitoring Suppl (ONE TOUCH ULTRA MINI) w/Device KIT    Sig: Use meter to check blood sugars 1-4 times daily as instructed.    Dispense:  1 each    Refill:  0    Substitution permissible per insurance coverage. Dx E11.9.    Order Specific Question:   Supervising Provider    Answer:   Pricilla Holm A [[id number]]  . Lancets Misc. (ONE TOUCH SURESOFT) MISC    Sig: Use 1 lancet per test. Test blood sugars 1-4 times per day as instructed.    Dispense:  1 each    Refill:  1    Substitution permissible per insurance coverage. Dx E11.9.    Order Specific Question:   Supervising Provider    Answer:  CRAWFORD, ELIZABETH A [[id number]]     Follow-up: Return in about 3 months (around 01/22/2017), or if symptoms worsen or fail to improve.  Mauricio Po, FNP

## 2016-10-22 NOTE — Assessment & Plan Note (Addendum)
New-onset type 2 diabetes with some improvements with starting metformin. No adverse side effects. Continues to experience some fatigue which is most likely related to chronic conditions. Diabetic testing supplies sent to pharmacy with instructions to test blood sugars 1-2 times daily. Diabetic foot exam completed today with instructions for self exam provided to patient. Not currently maintained on an Ace/ARB or statin medication for CAD risk reduction. Diabetic eye exam is up-to-date per recommendations. Referral placed for diabetic education. Continue current dosage of metformin and follow-up A1c in 2 months.

## 2016-11-10 LAB — CUP PACEART REMOTE DEVICE CHECK
Battery Impedance: 159 Ohm
Brady Statistic AP VS Percent: 35 %
Brady Statistic AS VP Percent: 0 %
Date Time Interrogation Session: 20180725133812
Implantable Lead Implant Date: 20160301
Implantable Lead Model: 5076
Implantable Lead Model: 5076
Lead Channel Impedance Value: 475 Ohm
Lead Channel Impedance Value: 692 Ohm
Lead Channel Pacing Threshold Amplitude: 0.625 V
Lead Channel Pacing Threshold Pulse Width: 0.4 ms
Lead Channel Setting Sensing Sensitivity: 5.6 mV
MDC IDC LEAD IMPLANT DT: 20160301
MDC IDC LEAD LOCATION: 753859
MDC IDC LEAD LOCATION: 753860
MDC IDC MSMT BATTERY REMAINING LONGEVITY: 140 mo
MDC IDC MSMT BATTERY VOLTAGE: 2.78 V
MDC IDC MSMT LEADCHNL RA PACING THRESHOLD AMPLITUDE: 0.625 V
MDC IDC MSMT LEADCHNL RV PACING THRESHOLD PULSEWIDTH: 0.4 ms
MDC IDC PG IMPLANT DT: 20160301
MDC IDC SET LEADCHNL RA PACING AMPLITUDE: 2 V
MDC IDC SET LEADCHNL RV PACING AMPLITUDE: 2.5 V
MDC IDC SET LEADCHNL RV PACING PULSEWIDTH: 0.4 ms
MDC IDC STAT BRADY AP VP PERCENT: 0 %
MDC IDC STAT BRADY AS VS PERCENT: 64 %

## 2016-11-12 ENCOUNTER — Ambulatory Visit (INDEPENDENT_AMBULATORY_CARE_PROVIDER_SITE_OTHER): Payer: PPO | Admitting: *Deleted

## 2016-11-12 DIAGNOSIS — Z5181 Encounter for therapeutic drug level monitoring: Secondary | ICD-10-CM | POA: Diagnosis not present

## 2016-11-12 DIAGNOSIS — I050 Rheumatic mitral stenosis: Secondary | ICD-10-CM | POA: Diagnosis not present

## 2016-11-12 DIAGNOSIS — I4891 Unspecified atrial fibrillation: Secondary | ICD-10-CM

## 2016-11-12 DIAGNOSIS — I5033 Acute on chronic diastolic (congestive) heart failure: Secondary | ICD-10-CM | POA: Diagnosis not present

## 2016-11-12 DIAGNOSIS — Z954 Presence of other heart-valve replacement: Secondary | ICD-10-CM

## 2016-11-12 LAB — POCT INR: INR: 2.3

## 2016-11-29 ENCOUNTER — Other Ambulatory Visit: Payer: Self-pay | Admitting: Family

## 2016-11-29 DIAGNOSIS — F411 Generalized anxiety disorder: Secondary | ICD-10-CM

## 2016-11-30 NOTE — Telephone Encounter (Signed)
Last refill 09/04/16 per [place] [facility]

## 2016-12-02 ENCOUNTER — Other Ambulatory Visit: Payer: Self-pay | Admitting: Family

## 2016-12-03 ENCOUNTER — Ambulatory Visit (INDEPENDENT_AMBULATORY_CARE_PROVIDER_SITE_OTHER): Payer: PPO | Admitting: *Deleted

## 2016-12-03 DIAGNOSIS — I5033 Acute on chronic diastolic (congestive) heart failure: Secondary | ICD-10-CM

## 2016-12-03 DIAGNOSIS — I4891 Unspecified atrial fibrillation: Secondary | ICD-10-CM | POA: Diagnosis not present

## 2016-12-03 DIAGNOSIS — Z5181 Encounter for therapeutic drug level monitoring: Secondary | ICD-10-CM

## 2016-12-03 DIAGNOSIS — Z954 Presence of other heart-valve replacement: Secondary | ICD-10-CM

## 2016-12-03 DIAGNOSIS — I050 Rheumatic mitral stenosis: Secondary | ICD-10-CM | POA: Diagnosis not present

## 2016-12-03 LAB — POCT INR: INR: 3.1

## 2016-12-22 ENCOUNTER — Encounter: Payer: Self-pay | Admitting: Dietician

## 2016-12-22 ENCOUNTER — Encounter: Payer: PPO | Attending: Family | Admitting: Dietician

## 2016-12-22 ENCOUNTER — Ambulatory Visit (INDEPENDENT_AMBULATORY_CARE_PROVIDER_SITE_OTHER): Payer: PPO | Admitting: *Deleted

## 2016-12-22 DIAGNOSIS — I5033 Acute on chronic diastolic (congestive) heart failure: Secondary | ICD-10-CM | POA: Diagnosis not present

## 2016-12-22 DIAGNOSIS — I4891 Unspecified atrial fibrillation: Secondary | ICD-10-CM

## 2016-12-22 DIAGNOSIS — Z5181 Encounter for therapeutic drug level monitoring: Secondary | ICD-10-CM

## 2016-12-22 DIAGNOSIS — E119 Type 2 diabetes mellitus without complications: Secondary | ICD-10-CM | POA: Insufficient documentation

## 2016-12-22 DIAGNOSIS — Z713 Dietary counseling and surveillance: Secondary | ICD-10-CM | POA: Diagnosis not present

## 2016-12-22 DIAGNOSIS — I050 Rheumatic mitral stenosis: Secondary | ICD-10-CM

## 2016-12-22 DIAGNOSIS — Z954 Presence of other heart-valve replacement: Secondary | ICD-10-CM | POA: Diagnosis not present

## 2016-12-22 LAB — POCT INR: INR: 2.6

## 2016-12-22 NOTE — Progress Notes (Signed)
Patient was seen on 12/22/16 for the first of a series of three diabetes self-management courses at the [facility].  Patient Education Plan per assessed needs and concerns is to attend four course education program for Diabetes Self Management Education.  The following learning objectives were met by the patient during this class:  Describe diabetes  State some common risk factors for diabetes  Defines the role of glucose and insulin  Identifies type of diabetes and pathophysiology  Describe the relationship between diabetes and cardiovascular risk  State the members of the Healthcare Team  States the rationale for glucose monitoring  State when to test glucose  State their individual Target Range  State the importance of logging glucose readings  Describe how to interpret glucose readings  Identifies A1C target  Explain the correlation between A1c and eAG values  State symptoms and treatment of high blood glucose  State symptoms and treatment of low blood glucose  Explain proper technique for glucose testing  Identifies proper sharps disposal  Handouts given during class include:  Living Well with Diabetes book  Carb Counting and Meal Planning book  Meal Plan Card  Carbohydrate guide  Meal planning worksheet  Low Sodium Flavoring Tips  The diabetes portion plate  O8M to eAG Conversion Chart  Diabetes Medications  Diabetes Recommended Care Schedule  Support Group  Diabetes Success Plan  Core Class Satisfaction Survey   Follow-Up Plan:  Attend core 2

## 2016-12-23 ENCOUNTER — Ambulatory Visit: Payer: PPO | Admitting: Family

## 2016-12-24 ENCOUNTER — Encounter: Payer: Self-pay | Admitting: *Deleted

## 2016-12-29 ENCOUNTER — Ambulatory Visit (INDEPENDENT_AMBULATORY_CARE_PROVIDER_SITE_OTHER): Payer: PPO | Admitting: Family

## 2016-12-29 ENCOUNTER — Encounter: Payer: PPO | Admitting: Dietician

## 2016-12-29 ENCOUNTER — Encounter: Payer: Self-pay | Admitting: Family

## 2016-12-29 VITALS — BP 124/84 | HR 74 | Temp 98.1°F | Resp 16 | Ht 68.0 in | Wt 195.0 lb

## 2016-12-29 DIAGNOSIS — E119 Type 2 diabetes mellitus without complications: Secondary | ICD-10-CM

## 2016-12-29 DIAGNOSIS — Z713 Dietary counseling and surveillance: Secondary | ICD-10-CM | POA: Diagnosis not present

## 2016-12-29 LAB — POCT GLYCOSYLATED HEMOGLOBIN (HGB A1C): Hemoglobin A1C: 6.1

## 2016-12-29 NOTE — Progress Notes (Signed)
Subjective:    Patient ID: Kevin English, male    DOB: 08/15/1955, 61 y.o.   MRN: [id number]  Chief Complaint  Patient presents with  . Follow-up    Diabetes     HPI:  Kevin English is a 61 y.o. male who  has a past medical history of Anxiety; Borderline diabetes; CAD (coronary artery disease); CKD (chronic kidney disease), stage II; Complete heart block ([place]); Deafness in left ear; Depression; Diabetes mellitus without complication ([place]); Diverticulitis of colon (15 years ago); Hernia; HTN (hypertension); echocardiogram; Hyperlipidemia; Hypogonadism male; Hypothyroidism; Lung nodule seen on imaging study (04/10/2014); Mitral stenosis; Obesity; OSA on CPAP; Persistent atrial fibrillation ([place]); Renal cell carcinoma of right kidney ([place]) (09/05/2013); Renal mass; Rheumatic heart disease; S/P Minimally invasive maze operation for atrial fibrillation (05/15/2014); S/P minimally invasive mitral valve replacement with metallic valve and maze procedure (05/15/2014); and Stroke ([place]) (1998). and presents today for a follow up office visit.   Diabetes - Currently prescribed metformin. Reports taking the medication as prescribed and denies adverse side effects or hypoglycemic readings. Blood sugars at home . Denies new symptoms of end organ damage. No excessive hunger, thirst, or urination. Working on a low/carbohydrate modified oral intake. Has attended the first diabetes education class.   Lab Results  Component Value Date   HGBA1C 6.1 12/29/2016     Lab Results  Component Value Date   CREATININE 1.2 10/08/2016   BUN 19 10/08/2016   NA 134 (A) 10/08/2016   K 4.5 10/08/2016   CL 109 03/03/2016   CO2 21 (L) 03/03/2016       Allergies  Allergen Reactions  . Contrast Media [Iodinated Diagnostic Agents] Hives and Other (See Comments)    Patient started sneezing and coughing, patient broke out in hives, patient needs 13 hour prep if having IV contrast  . Ioxaglate Hives and Rash   Patient started sneezing and coughing, patient broke out in hives, patient needs 13 hour prep if having IV contrast  . Atorvastatin Other (See Comments)    Myalgias  . Crestor [Rosuvastatin] Other (See Comments)    Myalgias  . Fenofibrate Other (See Comments)    MYALGIAS   . Pravastatin Other (See Comments)    Myalgias       Outpatient Medications Prior to Visit  Medication Sig Dispense Refill  . acetaminophen (TYLENOL) 325 MG tablet Take 325 mg by mouth every 6 (six) hours as needed (pain).     Marland Kitchen ALPRAZolam (XANAX) 0.5 MG tablet TAKE 1 TO 2 TABLETS BY MOUTH AT BEDTIME AS NEEDED FOR ANXIETY/SLEEP 90 tablet 0  . amLODipine (NORVASC) 10 MG tablet TAKE 1 TABLET (10 MG TOTAL) BY MOUTH DAILY. 90 tablet 2  . aspirin EC 81 MG tablet Take 1 tablet (81 mg total) by mouth daily.    . Blood Glucose Monitoring Suppl (ONE TOUCH ULTRA MINI) w/Device KIT Use meter to check blood sugars 1-4 times daily as instructed. 1 each 0  . glucose blood (ONE TOUCH ULTRA TEST) test strip Use one strip per test. Test blood sugars 1-4 times daily as instructed. 100 each 12  . Lancets Misc. (ONE TOUCH SURESOFT) MISC Use 1 lancet per test. Test blood sugars 1-4 times per day as instructed. 1 each 1  . levothyroxine (SYNTHROID, LEVOTHROID) 75 MCG tablet Take 1 tablet (75 mcg total) by mouth daily before breakfast. 90 tablet 3  . loratadine (CLARITIN) 10 MG tablet Take 1 tablet (10 mg total) by mouth daily.    Marland Kitchen  metFORMIN (GLUCOPHAGE) 500 MG tablet TAKE 1 TABLET (500 MG TOTAL) BY MOUTH 2 (TWO) TIMES DAILY WITH A MEAL. 60 tablet 1  . omega-3 acid ethyl esters (LOVAZA) 1 g capsule Take 2 capsules (2 g total) by mouth 2 (two) times daily. 360 capsule 2  . warfarin (COUMADIN) 5 MG tablet TAKE AS DIRECTED BY ANTICOAGULATION CLINIC 40 tablet 3  . metoprolol (LOPRESSOR) 50 MG tablet Take 1 tablet (50 mg total) by mouth 2 (two) times daily. 180 tablet 3  . fluticasone (FLONASE) 50 MCG/ACT nasal spray Place 2 sprays into both  nostrils daily.  2   No facility-administered medications prior to visit.      Past Medical History:  Diagnosis Date  . Anxiety   . Borderline diabetes   . CAD (coronary artery disease)    a. By cath 08/2013: 70% distal cx-prox LPDA; tandem mod LAD lesions, o/w mild dz.  . CKD (chronic kidney disease), stage II    his creat. runs 2-2.5; hasnt seen neph yet - sees dr. Tresa [facility]  . Complete heart block (H[facility])    a. s/p PPM   . Deafness in left ear   . Depression   . Diabetes mellitus without complication ([place])   . Diverticulitis of colon 15 years ago  . Hernia   . HTN (hypertension)   . Hx of echocardiogram    post MVR >> Echo 4/16:  Mild LVH, EF 45-50%, apical dyskinesis, trivial AI, mechanical MVR functioning appropriately, severe LAE  . Hyperlipidemia   . Hypogonadism male   . Hypothyroidism   . Lung nodule seen on imaging study 04/10/2014   5 mm nodule right lower lobe  . Mitral stenosis    a. Dx 07/2013 - through workup of 2D echo, TEE, and cath, felt to be moderate.  . Obesity   . OSA on CPAP   . Persistent atrial fibrillation ([place])    a. sustained 160-180 on e-CARDIO monitor placed 07/29/2013. Placed on IV amiodarone at end of May 2015 due to continued paroxysms with RVR.  Marland Kitchen Renal cell carcinoma of right kidney ([place]) 09/05/2013   clear cell renal cell carcinoma of right kidney treated by radical nephrectomy, Fuhrman grade 2-3, with maximum tumor diameter 2.7 cm, all tumor to find confined to the kidney, and all surgical margins negative (T1aN0).   . Renal mass    a. Dx 08/2013: concerning for renal cancer.  . Rheumatic heart disease   . S/P Minimally invasive maze operation for atrial fibrillation 05/15/2014   Left side lesion set using cryothermy with clipping of LA appendage  . S/P minimally invasive mitral valve replacement with metallic valve and maze procedure 05/15/2014   18m Sorin [employer] Optiform mechanical valve placed via right mini thoracotomy approach    . Stroke ([facility] 1998   "MRI showed 3 mild strokes"      Review of Systems  Eyes:       Negative for changes in vision.  Respiratory: Negative for chest tightness and shortness of breath.   Cardiovascular: Negative for chest pain, palpitations and leg swelling.  Endocrine: Negative for polydipsia, polyphagia and polyuria.  Neurological: Negative for dizziness, weakness, light-headedness and headaches.      Objective:    BP 124/84 (BP Location: Left Arm, Patient Position: Sitting, Cuff Size: Large)   Pulse 74   Temp 98.1 F (36.7 C) (Oral)   Resp 16   Ht 5' 8"  (1.727 m)   Wt 195 lb (88.5 kg)  SpO2 97%   BMI 29.65 kg/m  Nursing note and vital signs reviewed.  Physical Exam  Constitutional: He is oriented to person, place, and time. He appears well-developed and well-nourished. No distress.  Cardiovascular: Normal rate, regular rhythm, normal heart sounds and intact distal pulses.   Pulmonary/Chest: Effort normal and breath sounds normal.  Neurological: He is alert and oriented to person, place, and time.  Skin: Skin is warm and dry.  Psychiatric: He has a normal mood and affect. His behavior is normal. Judgment and thought content normal.       Assessment & Plan:   Problem List Items Addressed This Visit      Endocrine   Type 2 diabetes mellitus without complication, without long-term current use of insulin ([place]) - Primary    In office A1c of 6.1 which is significantly improved from 10.6. No side effects of current medication regimen and has continued to work on nutrition. He has also been attending diabetes education classes. Continue current dosage of metformin. Recheck in 3 months and consider stopping medication if A1c remains in pre-diabetic range. Encouraged to complete diabetic eye exam independently. Will need urine microalbumin and Pneumovax at next visit.       Relevant Orders   POCT HgB A1C (Completed)       I have discontinued Mr. Grismer fluticasone.  I am also having him maintain his acetaminophen, aspirin EC, loratadine, metoprolol tartrate, levothyroxine, amLODipine, omega-3 acid ethyl esters, warfarin, glucose blood, ONE TOUCH ULTRA MINI, ONE TOUCH SURESOFT, ALPRAZolam, and metFORMIN.   Follow-up: Return in about 3 months (around 03/31/2017), or if symptoms worsen or fail to improve.  Mauricio Po, FNP

## 2016-12-29 NOTE — Assessment & Plan Note (Signed)
In office A1c of 6.1 which is significantly improved from 10.6. No side effects of current medication regimen and has continued to work on nutrition. He has also been attending diabetes education classes. Continue current dosage of metformin. Recheck in 3 months and consider stopping medication if A1c remains in pre-diabetic range. Encouraged to complete diabetic eye exam independently. Will need urine microalbumin and Pneumovax at next visit.

## 2016-12-29 NOTE — Patient Instructions (Signed)
Thank you for choosing [facility].  SUMMARY AND INSTRUCTIONS:  You are doing a great job! Your A1c is 6.1!  Please continue your current dosage of metformin for the next 3 months and if you blood sugars remain well controlled we can consider taking you off the metformin.    Continue to take your other medications as prescribed.   Medication:  Your prescription(s) have been submitted to your pharmacy or been printed and provided for you. Please take as directed and contact our office if you believe you are having problem(s) with the medication(s) or have any questions.   Follow up:  If your symptoms worsen or fail to improve, please contact our office for further instruction, or in case of emergency go directly to the emergency room at the closest medical facility.

## 2016-12-31 NOTE — Progress Notes (Signed)
Patient was seen on 12/29/16 for the second of a series of three diabetes self-management courses at the [facility]. The following learning objectives were met by the patient during this class:   Describe the role of different macronutrients on glucose  Explain how carbohydrates affect blood glucose  State what foods contain the most carbohydrates  Demonstrate carbohydrate counting  Demonstrate how to read Nutrition Facts food label  Describe effects of various fats on heart health  Describe the importance of good nutrition for health and healthy eating strategies  Describe techniques for managing your shopping, cooking and meal planning  List strategies to follow meal plan when dining out  Describe the effects of alcohol on glucose and how to use it safely  Goals:  Follow Diabetes Meal Plan as instructed  Aim to spread carbs evenly throughout the day  Aim for 3 meals per day and snacks as needed Include lean protein foods to meals/snacks  Monitor glucose levels as instructed by your doctor   Follow-Up Plan:  Attend Core 3  Work towards following your personal food plan.   

## 2017-01-04 ENCOUNTER — Ambulatory Visit (INDEPENDENT_AMBULATORY_CARE_PROVIDER_SITE_OTHER): Payer: PPO | Admitting: Cardiology

## 2017-01-04 ENCOUNTER — Encounter: Payer: Self-pay | Admitting: Cardiology

## 2017-01-04 VITALS — BP 126/62 | HR 60 | Ht 68.0 in | Wt 198.0 lb

## 2017-01-04 DIAGNOSIS — Z8679 Personal history of other diseases of the circulatory system: Secondary | ICD-10-CM | POA: Diagnosis not present

## 2017-01-04 DIAGNOSIS — Z954 Presence of other heart-valve replacement: Secondary | ICD-10-CM

## 2017-01-04 DIAGNOSIS — Z9889 Other specified postprocedural states: Secondary | ICD-10-CM

## 2017-01-04 DIAGNOSIS — I442 Atrioventricular block, complete: Secondary | ICD-10-CM | POA: Diagnosis not present

## 2017-01-04 DIAGNOSIS — Z5181 Encounter for therapeutic drug level monitoring: Secondary | ICD-10-CM | POA: Diagnosis not present

## 2017-01-04 DIAGNOSIS — I480 Paroxysmal atrial fibrillation: Secondary | ICD-10-CM

## 2017-01-04 NOTE — Progress Notes (Signed)
Cardiology Office Note:    Date:  01/04/2017   ID:  Kevin English, DOB 1956-03-18, MRN [id number]  PCP:  [place], FNP  Cardiologist:  Dr. Ena Dawley   Electrophysiologist:  Dr. Thompson Grayer  Nephrologist: Dr. Marval Regal  Chief complain: 6 months follow-up  History of Present Illness:     Kevin English is a 61 y.o. male with a hx of HTN, HL (intol to statins), former smoker, PAFib, Rheumatic heart disease with severe mitral stenosis, diastolic HF, depression, CKD, renal cell CA, OSA. He has been on Coumadin. He had been prepared for minimally invasive MVR and placed on Amiodarone. However, he developed clear cell renal cell CA and underwent R nephrectomy. He developed fatigue with beta-blocker and Bystolic was DC'd. He was ultimately stabilized and underwent Minimally-Invasive Mitral Valve Replacement (Sorin Carbomedics Optiform bileaflet mechanical valve) and Maze Procedure with Left atrial lesion set using cryothermy and Clipping of Left Atrial Appendage with Dr. Roxy Manns in 2/16. Post op course was complicated by complete heart block. He ultimately required implantation of a Medtronic Adapta L dual-chamber pacemaker.   03/04/2016 - the patient has been dealing with progressively worsening depression, he meets with his friends but has no interest or joy in life, his children or family life. He states he doesn't care about his heart health because he doesn't feel well. On Tuesday, 03/03/2016 he was at [employer] with his friends when he walked to the bathroom and felt that he is legs were weak and went to the ground and couldn't get up. He denies any chest pain palpitation or dizziness at the time. An ambulance was called and he was taken to the ER. In the ER his labs were normal, and head CT showed no acute changes. He now denies any weakness. He was seen by neurologist yesterday who thinks that this is related to progressive severe depression but order nerve conduction  studies and electromyography study. Today he denies any chest pain and worsening shortness of breath he has been compliant with his warfarin today's INR 2.3. No bleeding. No palpitations. No lower extremity edema orthopnea or personal nocturnal dyspnea.  06/08/2016 - this is 3 months follow-up, the patient is doing well from cardiac standpoint he denies any chest pain, shortness of breath, no lower extremity edema or claudications. He also hasn't experienced any palpitations or syncope. He has been compliant with his medications and has no side effects including no bleeding with warfarin. His most significant problem is depression that prevents him from going outside most days, he refuses to do any exercises and is not motivated. He has seen a therapist but increased Zoloft dose to 100 mg a day. He was also seen by neurologist for muscle weakness with so far negative workup.  01/04/2017, this is a months follow-up, the patient seems to be doing well she still seemed depressed and not motivated, doesn't exercise, leaves house infrequently, however he was recently diagnosed with diabetes and has lost 15 pounds. He denies any chest pain shortness of breath, orthopnea or nocturnal dyspnea no lower extremity edema palpitations dizziness or syncope. No cough. Has been compliant with his medication, his INR is always at goal and has no bleeding on Coumadin.   Past Medical History:  Diagnosis Date  . Anxiety   . Borderline diabetes   . CAD (coronary artery disease)    a. By cath 08/2013: 70% distal cx-prox LPDA; tandem mod LAD lesions, o/w mild dz.  . CKD (chronic kidney disease), stage  II    his creat. runs 2-2.5; hasnt seen neph yet - sees dr. Tresa [facility]  . Complete heart block (HCC)    a. s/p PPM   . Deafness in left ear   . Depression   . Diabetes mellitus without complication ([place])   . Diverticulitis of colon 15 years ago  . Hernia   . HTN (hypertension)   . Hx of echocardiogram    post  MVR >> Echo 4/16:  Mild LVH, EF 45-50%, apical dyskinesis, trivial AI, mechanical MVR functioning appropriately, severe LAE  . Hyperlipidemia   . Hypogonadism male   . Hypothyroidism   . Lung nodule seen on imaging study 04/10/2014   5 mm nodule right lower lobe  . Mitral stenosis    a. Dx 07/2013 - through workup of 2D echo, TEE, and cath, felt to be moderate.  . Obesity   . OSA on CPAP   . Persistent atrial fibrillation ([place])    a. sustained 160-180 on e-CARDIO monitor placed 07/29/2013. Placed on IV amiodarone at end of May 2015 due to continued paroxysms with RVR.  Marland Kitchen Renal cell carcinoma of right kidney ([place]) 09/05/2013   clear cell renal cell carcinoma of right kidney treated by radical nephrectomy, Fuhrman grade 2-3, with maximum tumor diameter 2.7 cm, all tumor to find confined to the kidney, and all surgical margins negative (T1aN0).   . Renal mass    a. Dx 08/2013: concerning for renal cancer.  . Rheumatic heart disease   . S/P Minimally invasive maze operation for atrial fibrillation 05/15/2014   Left side lesion set using cryothermy with clipping of LA appendage  . S/P minimally invasive mitral valve replacement with metallic valve and maze procedure 05/15/2014   64m Sorin Carbomedics Optiform mechanical valve placed via right mini thoracotomy approach  . Stroke ([facility] 1998   "MRI showed 3 mild strokes"    Past Surgical History:  Procedure Laterality Date  . CARDIAC CATHETERIZATION  08/28/13  . EYE SURGERY Right 1990   "reconstructive, lens implant- from paintball accident"  . INGUINAL HERNIA REPAIR Bilateral first one in 80's   "I had one side done twice"  . LEFT AND RIGHT HEART CATHETERIZATION WITH CORONARY ANGIOGRAM N/A 08/28/2013   Procedure: LEFT AND RIGHT HEART CATHETERIZATION WITH CORONARY ANGIOGRAM;  Surgeon: DLeonie Man MD;  [facility];  Service: Cardiovascular;  Laterality: N/A;  . MINIMALLY INVASIVE MAZE PROCEDURE N/A 05/15/2014   Procedure: MINIMALLY  INVASIVE MAZE PROCEDURE;  Surgeon: CRexene Alberts MD;  [facility]: [facility]  Service: Open Heart Surgery;  Laterality: N/A;  . MITRAL VALVE REPLACEMENT Right 05/15/2014   Procedure: MINIMALLY INVASIVE MITRAL VALVE (MV) REPLACEMENT;  Surgeon: CRexene Alberts MD;  [facility]  Service: Open Heart Surgery;  Laterality: Right;  . PERMANENT PACEMAKER INSERTION Left 05/22/2014   Procedure: PERMANENT PACEMAKER INSERTION;  Surgeon: JThompson Grayer MD;  [facility];  Service: Cardiovascular;  Laterality: Left;  . ROBOT ASSISTED LAPAROSCOPIC NEPHRECTOMY Right 09/05/2013   Procedure: ROBOTIC ASSISTED LAPAROSCOPIC RIGHT RADICAL NEPHRECTOMY;  Surgeon: DSharyn Creamer MD;  Location[facility];  Service: Urology;  Laterality: Right;  . TEE WITHOUT CARDIOVERSION N/A 08/16/2013   Procedure: TRANSESOPHAGEAL ECHOCARDIOGRAM (TEE);  Surgeon: KDorothy Spark MD;  [facility]  Service: Cardiovascular;  Laterality: N/A;  . TEE WITHOUT CARDIOVERSION N/A 05/15/2014   Procedure: TRANSESOPHAGEAL ECHOCARDIOGRAM (TEE);  Surgeon: CRexene Alberts MD;  [facility]  Service: Open Heart Surgery;  Laterality: N/A;    Current Medications: Outpatient Medications Prior to Visit  Medication Sig Dispense Refill  . acetaminophen (TYLENOL) 325 MG tablet Take 325 mg by mouth every 6 (six) hours as needed (pain).     Marland Kitchen ALPRAZolam (XANAX) 0.5 MG tablet TAKE 1 TO 2 TABLETS BY MOUTH AT BEDTIME AS NEEDED FOR ANXIETY/SLEEP 90 tablet 0  . amLODipine (NORVASC) 10 MG tablet TAKE 1 TABLET (10 MG TOTAL) BY MOUTH DAILY. 90 tablet 2  . aspirin EC 81 MG tablet Take 1 tablet (81 mg total) by mouth daily.    . Blood Glucose Monitoring Suppl (ONE TOUCH ULTRA MINI) w/Device KIT Use meter to check blood sugars 1-4 times daily as instructed. 1 each 0  . glucose blood (ONE TOUCH ULTRA TEST) test strip Use one strip per test. Test blood sugars 1-4 times daily as instructed. 100 each 12  . Lancets Misc. (ONE TOUCH SURESOFT) MISC Use  1 lancet per test. Test blood sugars 1-4 times per day as instructed. 1 each 1  . levothyroxine (SYNTHROID, LEVOTHROID) 75 MCG tablet Take 1 tablet (75 mcg total) by mouth daily before breakfast. 90 tablet 3  . loratadine (CLARITIN) 10 MG tablet Take 1 tablet (10 mg total) by mouth daily.    . metFORMIN (GLUCOPHAGE) 500 MG tablet TAKE 1 TABLET (500 MG TOTAL) BY MOUTH 2 (TWO) TIMES DAILY WITH A MEAL. 60 tablet 1  . omega-3 acid ethyl esters (LOVAZA) 1 g capsule Take 2 capsules (2 g total) by mouth 2 (two) times daily. 360 capsule 2  . warfarin (COUMADIN) 5 MG tablet TAKE AS DIRECTED BY ANTICOAGULATION CLINIC 40 tablet 3  . metoprolol (LOPRESSOR) 50 MG tablet Take 1 tablet (50 mg total) by mouth 2 (two) times daily. 180 tablet 3   No facility-administered medications prior to visit.      Allergies:   Contrast media [iodinated diagnostic agents]; Ioxaglate; Atorvastatin; Crestor [rosuvastatin]; Fenofibrate; Pravastatin; and Pseudoephedrine hcl   Social History   Social History  . Marital status: Married    Spouse name: N/A  . Number of children: 2  . Years of education: 16   Occupational History  . Retired     Social History Main Topics  . Smoking status: Former Smoker    Packs/day: 0.25    Years: 40.00    Types: Cigarettes, Cigars    Quit date: 03/02/2016  . Smokeless tobacco: Never Used     Comment: Cigar every now and then  . Alcohol use No  . Drug use: No  . Sexual activity: Not Currently   Other Topics Concern  . None   Social History Narrative   Fun: [employer] people.      Family History:  The patient's family history includes Heart attack in his mother; Hypertension in his mother and unknown relative; Stroke in his mother.   ROS:   Please see the history of present illness.    ROS All other systems reviewed and are negative.   Physical Exam:    VS:  BP 126/62   Pulse 60   Ht 5' 8"  (1.727 m)   Wt 198 lb (89.8 kg)   BMI 30.11 kg/m    GEN: Well nourished,  well developed, in no acute distress  HEENT: normal  Neck: no JVD, no masses Cardiac: mechanical S1, normal S2, RRR; no murmurs, rubs, or gallops, no edema;  Respiratory:  clear to auscultation bilaterally; no wheezing, rhonchi or rales GI: soft, nontender, nondistended MS: no deformity or  atrophy  Skin: warm and dry Neuro: No focal deficits  Psych: Alert and oriented x 3, normal affect  Wt Readings from Last 3 Encounters:  01/04/17 198 lb (89.8 kg)  12/29/16 195 lb (88.5 kg)  12/22/16 195 lb (88.5 kg)      Studies/Labs Reviewed:     EKG:  EKG is  ordered today.  The ekg ordered today demonstrates Normal sinus rhythm with right bundle branch block and left anterior fascicular block, unchanged from prior. This was personally reviewed.   Recent Labs: 01/27/2016: TSH 0.91 03/03/2016: Hemoglobin 18.0; Platelets 224 10/08/2016: ALT 29; BUN 19; Creatinine 1.2; Potassium 4.5; Sodium 134  Labs at nephrologists office 06/13/15: BUN 17, creatinine 1.50, potassium 5, ALT 25  Recent Lipid Panel    Component Value Date/Time   CHOL 159 03/19/2015 0756   TRIG 272 (H) 03/19/2015 0756   HDL 19 (L) 03/19/2015 0756   CHOLHDL 8.4 (H) 03/19/2015 0756   VLDL 54 (H) 03/19/2015 0756   LDLCALC 86 03/19/2015 0756   LDLDIRECT 62.0 12/13/2014 0845    Additional studies/ records that were reviewed today include:    Echo 7/16 Mild LVH, EF 50-55%, normal wall motion, trivial AI, mechanical MVR with normal function, mean gradient 4 mmHg, mild to moderate LAE, normal RV function  Echo 4/16 Mild LVH, EF 45-50%, apical dyskinesis, trivial AI, mechanical MVR okay, severe LAE  Carotid US 04/16/14 Bilateral - 1% to 39% ICA stensosis.  TEE 1/74/94 Normal systolic function, normal wall motion, mild AI, moderate LAE Impressions: Rheumatic mitral valve with moderate leaflet tip thickening and minimal calcifications. Moderate mitral stenosis and trace mitral regurgitation.  Cardiac cath 08/28/13 LM:  Normal LAD: Proximal 40-50%, distal 50-60% LCx: Small OM 70-80% (not amenable to PCI or CABG), PDA 70% LPDA: 10-20% RCA: Mid 30-40%   ASSESSMENT:     1. S/P mitral valve replacement with metallic valve   2. Encounter for therapeutic drug monitoring   3. PAF (paroxysmal atrial fibrillation) ([place])   4. S/P Maze operation for atrial fibrillation   5. Transient complete heart block (HCC)     PLAN:     In order of problems listed above:  1. S/p Mechanical MVR - Echo in 2/18 with normal LV function, well functioning mechanical MVR, normal transmitral gradients. Continue Coumadin. Continue SBE prophylaxis. No bleeding on Coumadin.  2. PAF - Maintaining NSR. 0% atrial fibrillation on pacemaker interrogation in May 2017. Continue Coumadin. Bradycardia resolved with decreased dose of metoprolol.  3. HL - Continue current Rx Lovaza, statin intolerant.  4. Essential hypertension - well controlled  5. CAD - No angina.  Continue ASA, beta-blocker.  6. OSA - Continue CPAP.  7. Depression -slightly improved, again encourage to exercise.     Medication Adjustments/Labs and Tests Ordered: Current medicines are reviewed at length with the patient today.  Concerns regarding medicines are outlined above.  Medication changes, Labs and Tests ordered today are outlined in the Patient Instructions noted below. Patient Instructions  Medication Instructions:   Your physician recommends that you continue on your current medications as directed. Please refer to the Current Medication list given to you today.    Follow-Up:  Your physician wants you to follow-up in: [place] will receive a reminder letter in the mail two months in advance. If you don't receive a letter, please call our office to schedule the follow-up appointment.        If you need a refill on your cardiac  medications before your next appointment, please call your pharmacy.    Signed, Ena Dawley, MD  01/04/2017 1:08 PM    [facility] [address], [place], [place]  [address] Phone: [phone]; Fax: [phone]

## 2017-01-04 NOTE — Patient Instructions (Signed)

## 2017-01-05 ENCOUNTER — Encounter: Payer: PPO | Admitting: Dietician

## 2017-01-05 DIAGNOSIS — Z713 Dietary counseling and surveillance: Secondary | ICD-10-CM | POA: Diagnosis not present

## 2017-01-05 DIAGNOSIS — N183 Chronic kidney disease, stage 3 unspecified: Secondary | ICD-10-CM

## 2017-01-05 DIAGNOSIS — E1122 Type 2 diabetes mellitus with diabetic chronic kidney disease: Secondary | ICD-10-CM

## 2017-01-05 NOTE — Progress Notes (Signed)
Patient was seen on 01/05/17 for the third of a series of three diabetes self-management courses at [facility].   [place] the amount of activity recommended for healthy living . Describe activities suitable for individual needs . Identify ways to regularly incorporate activity into daily life . Identify barriers to activity and ways to over come these barriers  Identify diabetes medications being personally used and their primary action for lowering glucose and possible side effects . Describe role of stress on blood glucose and develop strategies to address psychosocial issues . Identify diabetes complications and ways to prevent them  Explain how to manage diabetes during illness . Evaluate success in meeting personal goal . Establish 2-3 goals that they will plan to diligently work on until they return for the  21-month follow-up visit  Goals:   I will take my diabetes medications as scheduled  I will test my glucose at least 1 times a day, 7 days a week  To help manage stress I will  walk at least 1 times a week  He states that he continues to smoke occasionally and verbalized that he is the one to make up his mind to quit.  He is currently contemplating.   Your patient has identified these potential barriers to change:  Motivation    Plan:  Attend Support Group as desired

## 2017-01-11 DIAGNOSIS — N183 Chronic kidney disease, stage 3 (moderate): Secondary | ICD-10-CM | POA: Diagnosis not present

## 2017-01-13 ENCOUNTER — Ambulatory Visit (INDEPENDENT_AMBULATORY_CARE_PROVIDER_SITE_OTHER): Payer: PPO | Admitting: *Deleted

## 2017-01-13 DIAGNOSIS — I495 Sick sinus syndrome: Secondary | ICD-10-CM

## 2017-01-13 NOTE — Progress Notes (Signed)
Remote pacemaker transmission.   

## 2017-01-14 LAB — CUP PACEART REMOTE DEVICE CHECK
Battery Impedance: 159 Ohm
Brady Statistic AP VS Percent: 24 %
Brady Statistic AS VS Percent: 76 %
Date Time Interrogation Session: 20181024123955
Implantable Lead Implant Date: 20160301
Implantable Lead Location: 753860
Implantable Lead Model: 5076
Lead Channel Impedance Value: 690 Ohm
Lead Channel Pacing Threshold Amplitude: 0.625 V
Lead Channel Pacing Threshold Pulse Width: 0.4 ms
Lead Channel Pacing Threshold Pulse Width: 0.4 ms
Lead Channel Setting Sensing Sensitivity: 5.6 mV
MDC IDC LEAD IMPLANT DT: 20160301
MDC IDC LEAD LOCATION: 753859
MDC IDC MSMT BATTERY REMAINING LONGEVITY: 142 mo
MDC IDC MSMT BATTERY VOLTAGE: 2.78 V
MDC IDC MSMT LEADCHNL RA IMPEDANCE VALUE: 448 Ohm
MDC IDC MSMT LEADCHNL RA PACING THRESHOLD AMPLITUDE: 0.5 V
MDC IDC PG IMPLANT DT: 20160301
MDC IDC SET LEADCHNL RA PACING AMPLITUDE: 2 V
MDC IDC SET LEADCHNL RV PACING AMPLITUDE: 2.5 V
MDC IDC SET LEADCHNL RV PACING PULSEWIDTH: 0.4 ms
MDC IDC STAT BRADY AP VP PERCENT: 0 %
MDC IDC STAT BRADY AS VP PERCENT: 0 %

## 2017-01-15 ENCOUNTER — Encounter: Payer: Self-pay | Admitting: Cardiology

## 2017-01-19 ENCOUNTER — Ambulatory Visit (INDEPENDENT_AMBULATORY_CARE_PROVIDER_SITE_OTHER): Payer: PPO

## 2017-01-19 DIAGNOSIS — I4891 Unspecified atrial fibrillation: Secondary | ICD-10-CM | POA: Diagnosis not present

## 2017-01-19 DIAGNOSIS — Z5181 Encounter for therapeutic drug level monitoring: Secondary | ICD-10-CM

## 2017-01-19 DIAGNOSIS — Z954 Presence of other heart-valve replacement: Secondary | ICD-10-CM | POA: Diagnosis not present

## 2017-01-19 DIAGNOSIS — I050 Rheumatic mitral stenosis: Secondary | ICD-10-CM

## 2017-01-19 DIAGNOSIS — I5033 Acute on chronic diastolic (congestive) heart failure: Secondary | ICD-10-CM | POA: Diagnosis not present

## 2017-01-19 DIAGNOSIS — Z79899 Other long term (current) drug therapy: Secondary | ICD-10-CM

## 2017-01-19 LAB — POCT INR: INR: 2

## 2017-01-20 ENCOUNTER — Telehealth: Payer: Self-pay | Admitting: Cardiology

## 2017-01-20 NOTE — Telephone Encounter (Signed)
Visit yesterday wife state pt needs to hold coumadin when it should have been  Metformin 500mg .    Please give her a call if any questions.

## 2017-01-20 NOTE — Telephone Encounter (Signed)
Spouse called back and informed us that it's Metformin to be held prior to CT scan not Coumadin.

## 2017-01-26 ENCOUNTER — Other Ambulatory Visit: Payer: Self-pay | Admitting: Cardiology

## 2017-01-26 ENCOUNTER — Other Ambulatory Visit: Payer: Self-pay

## 2017-01-26 MED ORDER — METFORMIN HCL 500 MG PO TABS: 500.0000 mg | ORAL_TABLET | Freq: Two times a day (BID) | ORAL | 1 refills | Status: DC

## 2017-01-29 ENCOUNTER — Other Ambulatory Visit: Payer: Self-pay | Admitting: Urology

## 2017-01-29 ENCOUNTER — Ambulatory Visit (HOSPITAL_COMMUNITY)
Admission: RE | Admit: 2017-01-29 | Discharge: 2017-01-29 | Disposition: A | Discharge status: Home / Self Care | Payer: PPO | Source: Ambulatory Visit | Attending: Urology | Admitting: Urology

## 2017-01-29 DIAGNOSIS — C641 Malignant neoplasm of right kidney, except renal pelvis: Secondary | ICD-10-CM

## 2017-01-29 DIAGNOSIS — R918 Other nonspecific abnormal finding of lung field: Secondary | ICD-10-CM | POA: Diagnosis not present

## 2017-02-02 DIAGNOSIS — C641 Malignant neoplasm of right kidney, except renal pelvis: Secondary | ICD-10-CM | POA: Diagnosis not present

## 2017-02-08 ENCOUNTER — Ambulatory Visit (INDEPENDENT_AMBULATORY_CARE_PROVIDER_SITE_OTHER): Payer: PPO | Admitting: Pharmacist

## 2017-02-08 DIAGNOSIS — Z5181 Encounter for therapeutic drug level monitoring: Secondary | ICD-10-CM | POA: Diagnosis not present

## 2017-02-08 DIAGNOSIS — I5033 Acute on chronic diastolic (congestive) heart failure: Secondary | ICD-10-CM | POA: Diagnosis not present

## 2017-02-08 DIAGNOSIS — I4891 Unspecified atrial fibrillation: Secondary | ICD-10-CM

## 2017-02-08 DIAGNOSIS — I050 Rheumatic mitral stenosis: Secondary | ICD-10-CM

## 2017-02-08 DIAGNOSIS — Z954 Presence of other heart-valve replacement: Secondary | ICD-10-CM

## 2017-02-08 DIAGNOSIS — Z79899 Other long term (current) drug therapy: Secondary | ICD-10-CM

## 2017-02-08 LAB — POCT INR: INR: 2.2

## 2017-02-08 NOTE — Patient Instructions (Signed)
Take 1 and 1/2 tablets today, then start taking 1 tablet daily except take 1 and 1/2  tablets Tuesday and Saturdays. Recheck in 2 weeks.  Call [phone] with any questions. Continue eating 1 serving of greens a week.

## 2017-02-10 DIAGNOSIS — Z8679 Personal history of other diseases of the circulatory system: Secondary | ICD-10-CM | POA: Diagnosis not present

## 2017-02-10 DIAGNOSIS — C649 Malignant neoplasm of unspecified kidney, except renal pelvis: Secondary | ICD-10-CM | POA: Diagnosis not present

## 2017-02-10 DIAGNOSIS — I509 Heart failure, unspecified: Secondary | ICD-10-CM | POA: Diagnosis not present

## 2017-02-10 DIAGNOSIS — Z952 Presence of prosthetic heart valve: Secondary | ICD-10-CM | POA: Diagnosis not present

## 2017-02-10 DIAGNOSIS — F17201 Nicotine dependence, unspecified, in remission: Secondary | ICD-10-CM | POA: Diagnosis not present

## 2017-02-10 DIAGNOSIS — Z6828 Body mass index (BMI) 28.0-28.9, adult: Secondary | ICD-10-CM | POA: Diagnosis not present

## 2017-02-10 DIAGNOSIS — N179 Acute kidney failure, unspecified: Secondary | ICD-10-CM | POA: Diagnosis not present

## 2017-02-10 DIAGNOSIS — E1122 Type 2 diabetes mellitus with diabetic chronic kidney disease: Secondary | ICD-10-CM | POA: Diagnosis not present

## 2017-02-10 DIAGNOSIS — I4891 Unspecified atrial fibrillation: Secondary | ICD-10-CM | POA: Diagnosis not present

## 2017-02-10 DIAGNOSIS — N183 Chronic kidney disease, stage 3 (moderate): Secondary | ICD-10-CM | POA: Diagnosis not present

## 2017-02-10 DIAGNOSIS — D751 Secondary polycythemia: Secondary | ICD-10-CM | POA: Diagnosis not present

## 2017-02-10 DIAGNOSIS — I129 Hypertensive chronic kidney disease with stage 1 through stage 4 chronic kidney disease, or unspecified chronic kidney disease: Secondary | ICD-10-CM | POA: Diagnosis not present

## 2017-02-22 ENCOUNTER — Ambulatory Visit (INDEPENDENT_AMBULATORY_CARE_PROVIDER_SITE_OTHER): Payer: PPO | Admitting: *Deleted

## 2017-02-22 DIAGNOSIS — Z5181 Encounter for therapeutic drug level monitoring: Secondary | ICD-10-CM

## 2017-02-22 DIAGNOSIS — I5033 Acute on chronic diastolic (congestive) heart failure: Secondary | ICD-10-CM

## 2017-02-22 DIAGNOSIS — I4891 Unspecified atrial fibrillation: Secondary | ICD-10-CM | POA: Diagnosis not present

## 2017-02-22 DIAGNOSIS — I050 Rheumatic mitral stenosis: Secondary | ICD-10-CM

## 2017-02-22 DIAGNOSIS — Z954 Presence of other heart-valve replacement: Secondary | ICD-10-CM | POA: Diagnosis not present

## 2017-02-22 DIAGNOSIS — Z79899 Other long term (current) drug therapy: Secondary | ICD-10-CM

## 2017-02-22 LAB — POCT INR: INR: 2.6

## 2017-02-22 NOTE — Patient Instructions (Addendum)
Continue taking 1 tablet daily except take 1 and 1/2  tablets Tuesday and Saturdays. Recheck in 2 weeks.  Call [phone] with any questions. Hold off on leafy green vegetables to see if INR comes up.

## 2017-03-01 ENCOUNTER — Other Ambulatory Visit: Payer: Self-pay | Admitting: Cardiology

## 2017-03-05 ENCOUNTER — Other Ambulatory Visit: Payer: Self-pay | Admitting: Cardiology

## 2017-03-12 ENCOUNTER — Ambulatory Visit (INDEPENDENT_AMBULATORY_CARE_PROVIDER_SITE_OTHER): Payer: PPO | Admitting: *Deleted

## 2017-03-12 DIAGNOSIS — Z79899 Other long term (current) drug therapy: Secondary | ICD-10-CM | POA: Diagnosis not present

## 2017-03-12 DIAGNOSIS — I4891 Unspecified atrial fibrillation: Secondary | ICD-10-CM

## 2017-03-12 DIAGNOSIS — I5033 Acute on chronic diastolic (congestive) heart failure: Secondary | ICD-10-CM

## 2017-03-12 DIAGNOSIS — Z5181 Encounter for therapeutic drug level monitoring: Secondary | ICD-10-CM

## 2017-03-12 DIAGNOSIS — Z954 Presence of other heart-valve replacement: Secondary | ICD-10-CM

## 2017-03-12 DIAGNOSIS — I050 Rheumatic mitral stenosis: Secondary | ICD-10-CM

## 2017-03-12 LAB — POCT INR: INR: 2.3

## 2017-03-12 NOTE — Patient Instructions (Signed)
Description   Today take 1.5 tablets then continue taking 1 tablet daily except take 1 and 1/2  tablets Tuesday and Saturdays. Recheck in 3 weeks.  Call [phone] with any questions.

## 2017-03-18 ENCOUNTER — Other Ambulatory Visit: Payer: Self-pay

## 2017-03-18 ENCOUNTER — Other Ambulatory Visit: Payer: Self-pay | Admitting: Family

## 2017-03-18 ENCOUNTER — Telehealth: Payer: Self-pay | Admitting: Family Medicine

## 2017-03-18 DIAGNOSIS — G4733 Obstructive sleep apnea (adult) (pediatric): Secondary | ICD-10-CM | POA: Diagnosis not present

## 2017-03-18 DIAGNOSIS — F411 Generalized anxiety disorder: Secondary | ICD-10-CM

## 2017-03-18 NOTE — Telephone Encounter (Signed)
Message sent to patient, waiting on reply for which supplies he needs and which company he uses.

## 2017-03-18 NOTE — Telephone Encounter (Signed)
Order placed in [place], message sent to patient to make him aware.

## 2017-03-18 NOTE — Telephone Encounter (Signed)
Can supplies be authorized or do you need to see the patient first?

## 2017-03-18 NOTE — Telephone Encounter (Signed)
Copied from [place]. Topic: Quick Communication - See Telephone Encounter >> Mar 18, 2017 10:30 AM Cleaster Corin, NT wrote: CRM for notification. See Telephone encounter for:   03/18/17. [facility] cpap supplies. Pt. Doesn't have an appt. With pcp until 04-01-2017 pt. Can be reached at [phone]

## 2017-03-18 NOTE — Telephone Encounter (Signed)
Yes.  As you said, we need a list of needed supplies(mask, tubing, cushions, filters etc?).  Need a down load to document compliance with the machine.

## 2017-03-18 NOTE — Telephone Encounter (Signed)
I called and spoke with patient, he is going to go with [facility] for his cpap supplies. He is need of a basin, hose, mask head gear, and the mask. I advised patient I would reach out to [facility] to see if they can send a form over or if we need to put the order in through [place]. Patient verbalized understanding and I advised him that I would update him once we finalize the order. I sent a message to Darlina Guys over at [facility] to see how we can proceed to help the patient. Awaiting response now.

## 2017-04-01 ENCOUNTER — Other Ambulatory Visit: Payer: Self-pay | Admitting: Nurse Practitioner

## 2017-04-01 ENCOUNTER — Ambulatory Visit (INDEPENDENT_AMBULATORY_CARE_PROVIDER_SITE_OTHER): Payer: PPO | Admitting: Family Medicine

## 2017-04-01 ENCOUNTER — Telehealth: Payer: Self-pay | Admitting: Pharmacist

## 2017-04-01 ENCOUNTER — Encounter: Payer: Self-pay | Admitting: Family Medicine

## 2017-04-01 ENCOUNTER — Ambulatory Visit (INDEPENDENT_AMBULATORY_CARE_PROVIDER_SITE_OTHER): Payer: PPO

## 2017-04-01 VITALS — BP 124/78 | HR 79 | Temp 97.7°F | Ht 68.0 in | Wt 197.0 lb

## 2017-04-01 DIAGNOSIS — F321 Major depressive disorder, single episode, moderate: Secondary | ICD-10-CM

## 2017-04-01 DIAGNOSIS — I2510 Atherosclerotic heart disease of native coronary artery without angina pectoris: Secondary | ICD-10-CM | POA: Diagnosis not present

## 2017-04-01 DIAGNOSIS — E1122 Type 2 diabetes mellitus with diabetic chronic kidney disease: Secondary | ICD-10-CM

## 2017-04-01 DIAGNOSIS — E039 Hypothyroidism, unspecified: Secondary | ICD-10-CM

## 2017-04-01 DIAGNOSIS — I4891 Unspecified atrial fibrillation: Secondary | ICD-10-CM | POA: Diagnosis not present

## 2017-04-01 DIAGNOSIS — I5033 Acute on chronic diastolic (congestive) heart failure: Secondary | ICD-10-CM

## 2017-04-01 DIAGNOSIS — Z23 Encounter for immunization: Secondary | ICD-10-CM

## 2017-04-01 DIAGNOSIS — I2583 Coronary atherosclerosis due to lipid rich plaque: Secondary | ICD-10-CM | POA: Diagnosis not present

## 2017-04-01 DIAGNOSIS — N183 Chronic kidney disease, stage 3 unspecified: Secondary | ICD-10-CM

## 2017-04-01 DIAGNOSIS — E782 Mixed hyperlipidemia: Secondary | ICD-10-CM

## 2017-04-01 DIAGNOSIS — Z79899 Other long term (current) drug therapy: Secondary | ICD-10-CM | POA: Diagnosis not present

## 2017-04-01 DIAGNOSIS — I5032 Chronic diastolic (congestive) heart failure: Secondary | ICD-10-CM

## 2017-04-01 DIAGNOSIS — Z7901 Long term (current) use of anticoagulants: Secondary | ICD-10-CM | POA: Diagnosis not present

## 2017-04-01 DIAGNOSIS — Z0000 Encounter for general adult medical examination without abnormal findings: Secondary | ICD-10-CM

## 2017-04-01 DIAGNOSIS — Z954 Presence of other heart-valve replacement: Secondary | ICD-10-CM | POA: Diagnosis not present

## 2017-04-01 DIAGNOSIS — Z85528 Personal history of other malignant neoplasm of kidney: Secondary | ICD-10-CM

## 2017-04-01 DIAGNOSIS — Z5181 Encounter for therapeutic drug level monitoring: Secondary | ICD-10-CM | POA: Diagnosis not present

## 2017-04-01 DIAGNOSIS — E559 Vitamin D deficiency, unspecified: Secondary | ICD-10-CM

## 2017-04-01 DIAGNOSIS — I050 Rheumatic mitral stenosis: Secondary | ICD-10-CM

## 2017-04-01 LAB — COMPREHENSIVE METABOLIC PANEL
ALBUMIN: 4.4 g/dL (ref 3.5–5.2)
ALK PHOS: 68 U/L (ref 39–117)
ALT: 30 U/L (ref 0–53)
AST: 21 U/L (ref 0–37)
BUN: 20 mg/dL (ref 6–23)
CALCIUM: 10.1 mg/dL (ref 8.4–10.5)
CO2: 25 mEq/L (ref 19–32)
CREATININE: 1.58 mg/dL — AB (ref 0.40–1.50)
Chloride: 106 mEq/L (ref 96–112)
GFR: 47.18 mL/min — ABNORMAL LOW (ref >60.00)
Glucose, Bld: 73 mg/dL (ref 70–99)
Potassium: 4.5 mEq/L (ref 3.5–5.1)
SODIUM: 139 meq/L (ref 135–145)
TOTAL PROTEIN: 7.5 g/dL (ref 6.0–8.3)
Total Bilirubin: 0.4 mg/dL (ref 0.2–1.2)

## 2017-04-01 LAB — POCT INR: INR: 3.7

## 2017-04-01 LAB — MICROALBUMIN / CREATININE URINE RATIO
Creatinine,U: 245.6 mg/dL
Microalb Creat Ratio: 2.7 mg/g (ref 0.0–30.0)
Microalb, Ur: 6.5 mg/dL — ABNORMAL HIGH (ref 0.0–1.9)

## 2017-04-01 LAB — URINALYSIS, ROUTINE W REFLEX MICROSCOPIC
Bilirubin Urine: NEGATIVE
Hgb urine dipstick: NEGATIVE
Ketones, ur: NEGATIVE
Leukocytes, UA: NEGATIVE
Nitrite: NEGATIVE
PH: 6 (ref 5.0–8.0)
RBC / HPF: NONE SEEN (ref 0–?)
SPECIFIC GRAVITY, URINE: 1.02 (ref 1.000–1.030)
Urine Glucose: NEGATIVE
Urobilinogen, UA: 0.2 (ref 0.0–1.0)

## 2017-04-01 LAB — VITAMIN D 25 HYDROXY (VIT D DEFICIENCY, FRACTURES): VITD: 26.48 ng/mL — ABNORMAL LOW (ref 30.00–100.00)

## 2017-04-01 LAB — HEMOGLOBIN A1C: HEMOGLOBIN A1C: 6 % (ref 4.6–6.5)

## 2017-04-01 LAB — TSH: TSH: 0.74 u[IU]/mL (ref 0.35–4.50)

## 2017-04-01 MED ORDER — PAROXETINE HCL 10 MG PO TABS: ORAL_TABLET | ORAL | 0 refills | Status: DC

## 2017-04-01 MED ORDER — TRAZODONE HCL 50 MG PO TABS: 25.0000 mg | ORAL_TABLET | Freq: Every evening | ORAL | 2 refills | Status: DC | PRN

## 2017-04-01 NOTE — Telephone Encounter (Signed)
Pt came to clinic today for an INR check and expressed concern that his cholesterol has not been checked in a while. Most recent lipid panel was drawn in 02/2015 and his LDL was 86 at that time. I had presented Zetia as an option to bring his LDL to goal < 70 since his insurance would not approve [id number] without a Zetia trial. Pt elected to focus on lifestyle changes rather than starting Zetia. No further changes have been made since then.  Will recheck fasting lipid panel and reassess whether Zetia is still an appropriate choice if pt needs additional LDL lowering.

## 2017-04-01 NOTE — Addendum Note (Signed)
Addended by: SUPPLE, MEGAN E on: 04/01/2017 10:12 AM   Modules accepted: Orders

## 2017-04-01 NOTE — Patient Instructions (Signed)
Description   Skip today's dosage of Coumadin, then continue on same dosage 1 tablet daily except take 1.5  tablets Tuesdays, Thursdays, and Saturdays. Recheck in 3 weeks.  Call [phone] with any questions.

## 2017-04-01 NOTE — Progress Notes (Addendum)
Subjective:  Patient ID: Kevin English, male    DOB: 1955/12/14  Age: 62 y.o. MRN: [id number]  CC: Establish Care   HPI RUI WORDELL presents for establishment of care accompanied by his wife.  He has had a lot going on medically over the last 3 years.  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  Cardiology is currently managing his atrial fib, congestive heart failure, coronary artery disease and elevated cholesterol.  He has recently been released by the urologist back in November.  His prostate and PSA were checked at that time.  He will be seeing me for his routine health maintenance, diabetes and hypothyroidism.  With all of this going on he has been severely depressed and not sleeping well.  He has a history of sleep apnea and is compliant with his CPAP machine.  His wife will be checking with the pulmonologist about  CPAP supplies.  He has a history of depression.  This has been treated with high dose Zoloft but that only made him to.  He self discontinued the Zoloft this past fall.  He has been on Xanax in the past for sleep but has not been taking that in quite some time.  He has a close childhood friend who is very ill and will probably die soon.  This is caused him to be very sad.  History Zeeshan has a past medical history of Anxiety, Borderline diabetes, CAD (coronary artery disease), CKD (chronic kidney disease), stage II, Complete heart block ([place]), Deafness in left ear, Depression, Diabetes mellitus without complication ([place]), Diverticulitis of colon (15 years ago), Hernia, HTN (hypertension), echocardiogram, Hyperlipidemia, Hypogonadism male, Hypothyroidism, Lung nodule seen on imaging study (04/10/2014), Mitral stenosis, Obesity, OSA on CPAP, Persistent atrial fibrillation  ([place]), Renal cell carcinoma of right kidney ([place]) (09/05/2013), Renal mass, Rheumatic heart disease, S/P Minimally invasive maze operation for atrial fibrillation (05/15/2014), S/P minimally invasive mitral valve replacement with metallic valve and maze procedure (05/15/2014), and Stroke ([place]) (1998).   He has a past surgical history that includes TEE without cardioversion (N/A, 08/16/2013); Inguinal hernia repair (Bilateral, first one in 80's); Cardiac catheterization (08/28/13); Eye surgery (Right, 1990); Robot assisted laparoscopic nephrectomy (Right, 09/05/2013); left and right heart catheterization with coronary angiogram (N/A, 08/28/2013); Mitral valve replacement (Right, 05/15/2014); Minimally invasive maze procedure (N/A, 05/15/2014); TEE without cardioversion (N/A, 05/15/2014); and permanent pacemaker insertion (Left, 05/22/2014).   His family history includes Heart attack in his mother; Hypertension in his mother and unknown relative; Stroke in his mother.He reports that he quit smoking about 13 months ago. His smoking use included cigarettes and cigars. He has a 10.00 pack-year smoking history. he has never used smokeless tobacco. He reports that he does not drink alcohol or use drugs.  Outpatient Medications Prior to Visit  Medication Sig Dispense Refill  . acetaminophen (TYLENOL) 325 MG tablet Take 325 mg by mouth every 6 (six) hours as needed (pain).     Marland Kitchen ALPRAZolam (XANAX) 0.5 MG tablet TAKE 1 TO 2 TABLETS BY MOUTH AT BEDTIME AS NEEDED FOR ANXIETY/SLEEP 90 tablet 0  . amLODipine (NORVASC) 10 MG tablet TAKE 1 TABLET (10 MG TOTAL) BY MOUTH DAILY. 90 tablet 2  . aspirin EC 81 MG tablet Take 1 tablet (81 mg total) by mouth daily.    . Blood  Glucose Monitoring Suppl (ONE TOUCH ULTRA MINI) w/Device KIT Use meter to check blood sugars 1-4 times daily as instructed. 1 each 0  . glucose blood (ONE TOUCH ULTRA TEST) test strip Use one strip per test. Test blood sugars 1-4 times daily as instructed. 100 each 12    . Lancets Misc. (ONE TOUCH SURESOFT) MISC Use 1 lancet per test. Test blood sugars 1-4 times per day as instructed. 1 each 1  . levothyroxine (SYNTHROID, LEVOTHROID) 75 MCG tablet TAKE 1 TABLET (75 MCG TOTAL) BY MOUTH DAILY BEFORE BREAKFAST. 90 tablet 2  . loratadine (CLARITIN) 10 MG tablet Take 1 tablet (10 mg total) by mouth daily.    . metFORMIN (GLUCOPHAGE) 500 MG tablet Take 1 tablet (500 mg total) 2 (two) times daily with a meal by mouth. Needs to establish with new PCP for more refills. 60 tablet 1  . metoprolol tartrate (LOPRESSOR) 50 MG tablet Take 1 tablet (50 mg total) by mouth 2 (two) times daily. 180 tablet 2  . omega-3 acid ethyl esters (LOVAZA) 1 g capsule Take 2 capsules (2 g total) by mouth 2 (two) times daily. 360 capsule 2  . warfarin (COUMADIN) 5 MG tablet TAKE AS DIRECTED BY ANTICOAGULATION CLINIC 40 tablet 3   No facility-administered medications prior to visit.    Depression screen [facility] 2/9 04/01/2017 01/05/2017 12/31/2016  Decreased Interest 3 0 0  Down, Depressed, Hopeless 3 0 0  PHQ - 2 Score 6 0 0  Altered sleeping 3 - -  Tired, decreased energy 3 - -  Change in appetite 3 - -  Feeling bad or failure about yourself  3 - -  Trouble concentrating 3 - -  Moving slowly or fidgety/restless 2 - -  Suicidal thoughts 1 - -  PHQ-9 Score 24 - -  Some recent data might be hidden     ROS Review of Systems  Constitutional: Positive for fatigue. Negative for chills, fever and unexpected weight change.  HENT: Positive for hearing loss. Negative for ear discharge and ear pain.   Eyes: Negative for photophobia and visual disturbance.  Respiratory: Positive for shortness of breath.   Cardiovascular: Negative for chest pain.  Gastrointestinal: Negative.   Endocrine: Negative for polyphagia and polyuria.  Genitourinary: Negative for decreased urine volume and hematuria.  Musculoskeletal: Negative for gait problem and myalgias.  Skin: Negative for pallor, rash and wound.   Allergic/Immunologic: Negative for immunocompromised state.  Neurological: Negative for tremors, seizures and headaches.  Hematological: Does not bruise/bleed easily.  Psychiatric/Behavioral: Positive for dysphoric mood and sleep disturbance.    Objective:  BP 124/78 (BP Location: Left Arm, Patient Position: Sitting, Cuff Size: Normal)   Pulse 79   Temp 97.7 F (36.5 C) (Oral)   Ht 5' 8"  (1.727 m)   Wt 197 lb (89.4 kg)   SpO2 97%   BMI 29.95 kg/m   Physical Exam  Constitutional: He is oriented to person, place, and time. He appears well-developed and well-nourished. No distress.  HENT:  Head: Normocephalic and atraumatic.  Right Ear: External ear normal.  Left Ear: External ear normal.  Mouth/Throat: Oropharynx is clear and moist. No oropharyngeal exudate.  Eyes: Conjunctivae are normal. Pupils are equal, round, and reactive to light. Right eye exhibits no discharge. Left eye exhibits no discharge. No scleral icterus.  Neck: Neck supple. No JVD present. No tracheal deviation present. No thyromegaly present.  Cardiovascular: Normal rate, regular rhythm and normal heart sounds.  Pulmonary/Chest: Effort normal and breath sounds normal.  No stridor.  Abdominal: Bowel sounds are normal.  Lymphadenopathy:    He has no cervical adenopathy.  Neurological: He is alert and oriented to person, place, and time.  Skin: Skin is warm and dry. He is not diaphoretic.  Psychiatric: He has a normal mood and affect. His behavior is normal.      Assessment & Plan:   Rhea was seen today for establish care.  Diagnoses and all orders for this visit:  Controlled type 2 diabetes mellitus with stage 3 chronic kidney disease, without long-term current use of insulin (HCC) -     Comprehensive metabolic panel -     Hemoglobin A1c -     Urinalysis, Routine w reflex microscopic -     Microalbumin / creatinine urine ratio  History of renal cell carcinoma  Vitamin D deficiency -     VITAMIN D 25  Hydroxy (Vit-D Deficiency, Fractures) -     Vitamin D, Ergocalciferol, (DRISDOL) 50000 units CAPS capsule; Take 1 capsule (50,000 Units total) by mouth every 7 (seven) days.  Chronic anticoagulation  Hypothyroidism (acquired) -     TSH  Chronic diastolic CHF (congestive heart failure), NYHA class 2 (HCC)  Coronary artery disease due to lipid rich plaque  Atrial fibrillation with RVR (HCC)  Healthcare maintenance -     Comprehensive metabolic panel -     HIV antibody  Need for influenza vaccination -     Flu Vaccine QUAD 36+ mos IM  Need for 23-polyvalent pneumococcal polysaccharide vaccine -     Pneumococcal polysaccharide vaccine 23-valent greater than or equal to 2yo subcutaneous/IM  Current moderate episode of major depressive disorder, unspecified whether recurrent (HCC) -     PARoxetine (PAXIL) 10 MG tablet; Take one at night for a week and then increase to 2 at night. -     traZODone (DESYREL) 50 MG tablet; Take 0.5-1 tablets (25-50 mg total) by mouth at bedtime as needed for sleep.   I am having [name]. Littler start on PARoxetine, traZODone, and Vitamin D (Ergocalciferol). I am also having him maintain his acetaminophen, aspirin EC, loratadine, amLODipine, omega-3 acid ethyl esters, glucose blood, ONE TOUCH ULTRA MINI, ONE TOUCH SURESOFT, ALPRAZolam, warfarin, metFORMIN, metoprolol tartrate, and levothyroxine.  Meds ordered this encounter  Medications  . PARoxetine (PAXIL) 10 MG tablet    Sig: Take one at night for a week and then increase to 2 at night.    Dispense:  60 tablet    Refill:  0  . traZODone (DESYREL) 50 MG tablet    Sig: Take 0.5-1 tablets (25-50 mg total) by mouth at bedtime as needed for sleep.    Dispense:  30 tablet    Refill:  2  . Vitamin D, Ergocalciferol, (DRISDOL) 50000 units CAPS capsule    Sig: Take 1 capsule (50,000 Units total) by mouth every 7 (seven) days.    Dispense:  30 capsule    Refill:  1     Follow-up: Return in about 1 month  (around 05/02/2017).  Libby Maw, MD

## 2017-04-02 ENCOUNTER — Other Ambulatory Visit: Payer: Self-pay

## 2017-04-02 ENCOUNTER — Encounter: Payer: Self-pay | Admitting: Family Medicine

## 2017-04-02 ENCOUNTER — Other Ambulatory Visit: Payer: Self-pay | Admitting: Nurse Practitioner

## 2017-04-02 LAB — HIV ANTIBODY (ROUTINE TESTING W REFLEX): HIV: NONREACTIVE

## 2017-04-02 MED ORDER — METFORMIN HCL 500 MG PO TABS: 500.0000 mg | ORAL_TABLET | Freq: Two times a day (BID) | ORAL | 3 refills | Status: DC

## 2017-04-02 MED ORDER — VITAMIN D (ERGOCALCIFEROL) 1.25 MG (50000 UNIT) PO CAPS: 50000.0000 [IU] | ORAL_CAPSULE | ORAL | 1 refills | Status: DC

## 2017-04-02 NOTE — Addendum Note (Signed)
Addended by: Jon Billings on: 04/02/2017 08:11 AM   Modules accepted: Orders

## 2017-04-08 ENCOUNTER — Other Ambulatory Visit: Payer: Self-pay | Admitting: Physician Assistant

## 2017-04-14 ENCOUNTER — Ambulatory Visit (INDEPENDENT_AMBULATORY_CARE_PROVIDER_SITE_OTHER): Payer: PPO | Admitting: *Deleted

## 2017-04-14 DIAGNOSIS — I495 Sick sinus syndrome: Secondary | ICD-10-CM

## 2017-04-14 DIAGNOSIS — N183 Chronic kidney disease, stage 3 (moderate): Secondary | ICD-10-CM | POA: Diagnosis not present

## 2017-04-14 NOTE — Progress Notes (Signed)
Remote pacemaker transmission.   

## 2017-04-15 ENCOUNTER — Encounter: Payer: Self-pay | Admitting: Cardiology

## 2017-04-22 ENCOUNTER — Other Ambulatory Visit: Payer: PPO | Admitting: *Deleted

## 2017-04-22 ENCOUNTER — Ambulatory Visit (INDEPENDENT_AMBULATORY_CARE_PROVIDER_SITE_OTHER): Payer: PPO | Admitting: *Deleted

## 2017-04-22 DIAGNOSIS — Z5181 Encounter for therapeutic drug level monitoring: Secondary | ICD-10-CM | POA: Diagnosis not present

## 2017-04-22 DIAGNOSIS — E782 Mixed hyperlipidemia: Secondary | ICD-10-CM

## 2017-04-22 DIAGNOSIS — I4891 Unspecified atrial fibrillation: Secondary | ICD-10-CM

## 2017-04-22 DIAGNOSIS — Z0000 Encounter for general adult medical examination without abnormal findings: Secondary | ICD-10-CM

## 2017-04-22 DIAGNOSIS — Z954 Presence of other heart-valve replacement: Secondary | ICD-10-CM | POA: Diagnosis not present

## 2017-04-22 DIAGNOSIS — I050 Rheumatic mitral stenosis: Secondary | ICD-10-CM

## 2017-04-22 DIAGNOSIS — I5033 Acute on chronic diastolic (congestive) heart failure: Secondary | ICD-10-CM

## 2017-04-22 LAB — LIPID PANEL
Chol/HDL Ratio: 7.3 ratio — ABNORMAL HIGH (ref 0.0–5.0)
Cholesterol, Total: 160 mg/dL (ref 100–199)
HDL: 22 mg/dL — ABNORMAL LOW (ref >39)
LDL Calculated: 77 mg/dL (ref 0–99)
Triglycerides: 307 mg/dL — ABNORMAL HIGH (ref 0–149)
VLDL Cholesterol Cal: 61 mg/dL — ABNORMAL HIGH (ref 5–40)

## 2017-04-22 LAB — POCT INR: INR: 3.3

## 2017-04-22 NOTE — Patient Instructions (Signed)
Description   Continue on same dosage 1 tablet daily except take 1.5  tablets Tuesdays, Thursdays, and Saturdays. Recheck in 3 weeks.  Call [phone] with any questions.

## 2017-04-27 ENCOUNTER — Telehealth: Payer: Self-pay | Admitting: Pharmacist

## 2017-04-27 LAB — CUP PACEART REMOTE DEVICE CHECK
Battery Remaining Longevity: 142 mo
Brady Statistic AS VP Percent: 0 %
Date Time Interrogation Session: 20190123141734
Implantable Lead Implant Date: 20160301
Implantable Lead Implant Date: 20160301
Implantable Lead Location: 753860
Implantable Lead Model: 5076
Implantable Pulse Generator Implant Date: 20160301
Lead Channel Pacing Threshold Amplitude: 0.625 V
Lead Channel Pacing Threshold Amplitude: 0.625 V
Lead Channel Pacing Threshold Pulse Width: 0.4 ms
Lead Channel Setting Pacing Amplitude: 2 V
Lead Channel Setting Sensing Sensitivity: 5.6 mV
MDC IDC LEAD LOCATION: 753859
MDC IDC MSMT BATTERY IMPEDANCE: 159 Ohm
MDC IDC MSMT BATTERY VOLTAGE: 2.78 V
MDC IDC MSMT LEADCHNL RA IMPEDANCE VALUE: 431 Ohm
MDC IDC MSMT LEADCHNL RA PACING THRESHOLD PULSEWIDTH: 0.4 ms
MDC IDC MSMT LEADCHNL RV IMPEDANCE VALUE: 642 Ohm
MDC IDC SET LEADCHNL RV PACING AMPLITUDE: 2.5 V
MDC IDC SET LEADCHNL RV PACING PULSEWIDTH: 0.4 ms
MDC IDC STAT BRADY AP VP PERCENT: 0 %
MDC IDC STAT BRADY AP VS PERCENT: 20 %
MDC IDC STAT BRADY AS VS PERCENT: 80 %

## 2017-04-27 MED ORDER — EZETIMIBE 10 MG PO TABS: 10.0000 mg | ORAL_TABLET | Freq: Every day | ORAL | 11 refills | Status: DC

## 2017-04-27 NOTE — Telephone Encounter (Signed)
Spoke with pt regarding his lipid panel results. TG remain elevated ~300. Pt confirmed he is taking his fish oil 2g BID. He is intolerant to fenofibrate. His A1c is 5 and he does not drink alcohol and tries to limit his carbs and sweets. His LDL is 77 however this is falsely low due to elevated TG. Pt is intolerant to Crestor, Lipitor, and pravastatin. He is willing to start Zetia 10mg  daily.  He will let us know how he tolerates the Zetia at his upcoming INR check. Will schedule f/u lipid panel in 2-3 months if he tolerates his Zetia well.

## 2017-04-27 NOTE — Progress Notes (Signed)
Electrophysiology Office Note Date: 04/28/2017  ID:  Kevin English, DOB [birth year], MRN [id number]  PCP: Libby Maw, MD Primary Cardiologist: [name] Electrophysiologist: Allred  CC: Pacemaker follow-up  Kevin English is a 63 y.o. male seen today for Dr Rayann Heman.  He presents today for routine electrophysiology followup.  Since last being seen in our clinic, the patient reports doing relatively well. He continues to struggle with depression and fatigue.  He does not feel that he has the energy to do anything. He has established with new PCP.  He is out of CPAP supplies and has an appt with pulmonary coming up.  He denies chest pain, palpitations, dyspnea, PND, orthopnea, nausea, vomiting, dizziness, syncope, edema, weight gain, or early satiety.  Device History: MDT dual chamber PPM implanted 2016 for complete heart block    Past Medical History:  Diagnosis Date  . Anxiety   . Borderline diabetes   . CAD (coronary artery disease)    a. By cath 08/2013: 70% distal cx-prox LPDA; tandem mod LAD lesions, o/w mild dz.  . CKD (chronic kidney disease), stage II    his creat. runs 2-2.5; hasnt seen neph yet - sees dr. Tresa Moore at [facility]  . Complete heart block (HCC)    a. s/p PPM   . Deafness in left ear   . Depression   . Diabetes mellitus without complication ([place])   . Diverticulitis of colon 15 years ago  . Hernia   . HTN (hypertension)   . Hyperlipidemia   . Hypogonadism male   . Hypothyroidism   . Lung nodule seen on imaging study 04/10/2014   5 mm nodule right lower lobe  . Mitral stenosis    a. Dx 07/2013 - through workup of 2D echo, TEE, and cath, felt to be moderate.  . Obesity   . OSA on CPAP   . Persistent atrial fibrillation ([place])    a. sustained 160-180 on e-CARDIO monitor placed 07/29/2013. Placed on IV amiodarone at end of May 2015 due to continued paroxysms with RVR.  Marland Kitchen Renal cell carcinoma of right kidney ([place]) 09/05/2013   clear cell renal cell carcinoma  of right kidney treated by radical nephrectomy, Fuhrman grade 2-3, with maximum tumor diameter 2.7 cm, all tumor to find confined to the kidney, and all surgical margins negative (T1aN0).   . Renal mass    a. Dx 08/2013: concerning for renal cancer.  . Rheumatic heart disease   . S/P Minimally invasive maze operation for atrial fibrillation 05/15/2014   Left side lesion set using cryothermy with clipping of LA appendage  . S/P minimally invasive mitral valve replacement with metallic valve and maze procedure 05/15/2014   72m Sorin Carbomedics Optiform mechanical valve placed via right mini thoracotomy approach  . Stroke ([facility] 1998   "MRI showed 3 mild strokes"   Past Surgical History:  Procedure Laterality Date  . EYE SURGERY Right 1990   "reconstructive, lens implant- from paintball accident"  . INGUINAL HERNIA REPAIR Bilateral first one in 80's   "I had one side done twice"  . LEFT AND RIGHT HEART CATHETERIZATION WITH CORONARY ANGIOGRAM N/A 08/28/2013   Procedure: LEFT AND RIGHT HEART CATHETERIZATION WITH CORONARY ANGIOGRAM;  Surgeon: DLeonie Man MD;  [facility];  Service: Cardiovascular;  Laterality: N/A;  . MINIMALLY INVASIVE MAZE PROCEDURE N/A 05/15/2014   Procedure: MINIMALLY INVASIVE MAZE PROCEDURE;  Surgeon: CRexene Alberts MD;  [facility]  Service: Open Heart Surgery;  Laterality:  N/A;  . MITRAL VALVE REPLACEMENT Right 05/15/2014   Procedure: MINIMALLY INVASIVE MITRAL VALVE (MV) REPLACEMENT;  Surgeon: Rexene Alberts, MD;  Location: [place];  Service: Open Heart Surgery;  Laterality: Right;  . PERMANENT PACEMAKER INSERTION Left 05/22/2014   a. MDT dual chamber PPM implanted by Dr Rayann Heman for CHB s/p MAZE/MVR  . ROBOT ASSISTED LAPAROSCOPIC NEPHRECTOMY Right 09/05/2013   Procedure: ROBOTIC ASSISTED LAPAROSCOPIC RIGHT RADICAL NEPHRECTOMY;  Surgeon: Sharyn Creamer, MD;  Location[facility];  Service: Urology;  Laterality: Right;  . TEE WITHOUT CARDIOVERSION N/A 08/16/2013     Procedure: TRANSESOPHAGEAL ECHOCARDIOGRAM (TEE);  Surgeon: Dorothy Spark, MD;  Location: [place];  Service: Cardiovascular;  Laterality: N/A;  . TEE WITHOUT CARDIOVERSION N/A 05/15/2014   Procedure: TRANSESOPHAGEAL ECHOCARDIOGRAM (TEE);  Surgeon: Rexene Alberts, MD;  Location: [place];  Service: Open Heart Surgery;  Laterality: N/A;    Current Outpatient Medications  Medication Sig Dispense Refill  . acetaminophen (TYLENOL) 325 MG tablet Take 325 mg by mouth every 6 (six) hours as needed (pain).     Marland Kitchen amLODipine (NORVASC) 10 MG tablet TAKE 1 TABLET (10 MG TOTAL) BY MOUTH DAILY. 90 tablet 2  . aspirin EC 81 MG tablet Take 1 tablet (81 mg total) by mouth daily.    . Blood Glucose Monitoring Suppl (ONE TOUCH ULTRA MINI) w/Device KIT Use meter to check blood sugars 1-4 times daily as instructed. 1 each 0  . ezetimibe (ZETIA) 10 MG tablet Take 1 tablet (10 mg total) by mouth daily. 30 tablet 11  . glucose blood (ONE TOUCH ULTRA TEST) test strip Use one strip per test. Test blood sugars 1-4 times daily as instructed. 100 each 12  . Lancets Misc. (ONE TOUCH SURESOFT) MISC Use 1 lancet per test. Test blood sugars 1-4 times per day as instructed. 1 each 1  . levothyroxine (SYNTHROID, LEVOTHROID) 75 MCG tablet TAKE 1 TABLET (75 MCG TOTAL) BY MOUTH DAILY BEFORE BREAKFAST. 90 tablet 2  . loratadine (CLARITIN) 10 MG tablet Take 1 tablet (10 mg total) by mouth daily.    . metFORMIN (GLUCOPHAGE) 500 MG tablet Take 1 tablet (500 mg total) by mouth 2 (two) times daily with a meal. 60 tablet 3  . metoprolol tartrate (LOPRESSOR) 50 MG tablet Take 1 tablet (50 mg total) by mouth 2 (two) times daily. 180 tablet 2  . omega-3 acid ethyl esters (LOVAZA) 1 g capsule Take 2 capsules (2 g total) by mouth 2 (two) times daily. 360 capsule 2  . PARoxetine (PAXIL) 10 MG tablet Take one at night for a week and then increase to 2 at night. 60 tablet 0  . traZODone (DESYREL) 50 MG tablet Take 0.5-1 tablets (25-50 mg  total) by mouth at bedtime as needed for sleep. 30 tablet 2  . Vitamin D, Ergocalciferol, (DRISDOL) 50000 units CAPS capsule Take 1 capsule (50,000 Units total) by mouth every 7 (seven) days. 30 capsule 1  . warfarin (COUMADIN) 5 MG tablet TAKE AS DIRECTED BY ANTICOAGULATION CLINIC 40 tablet 3   No current facility-administered medications for this visit.     Allergies:   Contrast media [iodinated diagnostic agents]; Ioxaglate; Atorvastatin; Crestor [rosuvastatin]; Fenofibrate; Pravastatin; and Pseudoephedrine hcl   Social History: Social History   Socioeconomic History  . Marital status: Married    Spouse name: Not on file  . Number of children: 2  . Years of education: 62  . Highest education level: Not on file  Social Needs  . Financial  resource strain: Not on file  . Food insecurity - worry: Not on file  . Food insecurity - inability: Not on file  . Transportation needs - medical: Not on file  . Transportation needs - non-medical: Not on file  Occupational History  . Occupation: Retired   Tobacco Use  . Smoking status: Former Smoker    Packs/day: 0.25    Years: 40.00    Pack years: 10.00    Types: Cigarettes, Cigars    Last attempt to quit: 03/02/2016    Years since quitting: 1.1  . Smokeless tobacco: Never Used  . Tobacco comment: Cigar every now and then  Substance and Sexual Activity  . Alcohol use: No    Alcohol/week: 0.0 oz  . Drug use: No  . Sexual activity: Not Currently  Other Topics Concern  . Not on file  Social History Narrative   Fun: [employer] people.     Family History: Family History  Problem Relation Age of Onset  . Hypertension Mother   . Heart attack Mother   . Stroke Mother   . Hypertension Unknown      Review of Systems: All other systems reviewed and are otherwise negative except as noted above.   Physical Exam: VS:  BP 124/72   Pulse 60   Ht 5' 8"  (1.727 m)   Wt 193 lb (87.5 kg)   SpO2 96%   BMI 29.35 kg/m  , BMI Body mass  index is 29.35 kg/m.  GEN- The patient is well appearing, alert and oriented x 3 today.   HEENT: normocephalic, atraumatic; sclera clear, conjunctiva pink; hearing intact; oropharynx clear; neck supple  Lungs- Clear to ausculation bilaterally, normal work of breathing.  No wheezes, rales, rhonchi Heart- Regular rate and rhythm  GI- soft, non-tender, non-distended, bowel sounds present  Extremities- no clubbing, cyanosis, or edema  MS- no significant deformity or atrophy Skin- warm and dry, no rash or lesion; PPM pocket well healed Psych- euthymic mood, full affect Neuro- strength and sensation are intact  PPM Interrogation- reviewed in detail today,  See PACEART report  EKG:  EKG is not ordered today.  Recent Labs: 04/01/2017: ALT 30; BUN 20; Creatinine, Ser 1.58; Potassium 4.5; Sodium 139; TSH 0.74   Wt Readings from Last 3 Encounters:  04/28/17 193 lb (87.5 kg)  04/01/17 197 lb (89.4 kg)  01/04/17 198 lb (89.8 kg)     Other studies Reviewed: Additional studies/ records that were reviewed today include: Dr Rayann Heman and Dr Francesca Oman office notes  Assessment and Plan:  1.  Transient complete heart block  Normal PPM function See Pace Art report Histograms are relatively flat, turned rate response on today to see if energy improves.  2.  Paroxysmal atrial fibrillation Burden 0% by device interrogation today Continue Warfarin for CHADS2VASC of 5 No bleeding issues  3.  MV disease s/p MVR Continue Warfarin Followed by Dr [name]  4.  HTN Stable No change required today  5.  CAD No recent ischemic symptoms Continue medical therapy   6.  NSVT Asymptomatic EF normal by echo last year  7.  Depression Per PCP If turning rate response on does not help - consider switching Lopressor to CCB    Current medicines are reviewed at length with the patient today.   The patient does not have concerns regarding his medicines.  The following changes were made today:   none  Labs/ tests ordered today include: none    Disposition:   Follow up with  [facility] transmissions, me in 1 year   Signed, Chanetta Marshall, NP 04/28/2017 10:20 AM  [address] [place] [address] [phone] (office) [phone] (fax)

## 2017-04-28 ENCOUNTER — Ambulatory Visit: Payer: PPO | Admitting: Nurse Practitioner

## 2017-04-28 ENCOUNTER — Encounter: Payer: Self-pay | Admitting: Nurse Practitioner

## 2017-04-28 VITALS — BP 124/72 | HR 60 | Ht 68.0 in | Wt 193.0 lb

## 2017-04-28 DIAGNOSIS — I442 Atrioventricular block, complete: Secondary | ICD-10-CM

## 2017-04-28 DIAGNOSIS — I480 Paroxysmal atrial fibrillation: Secondary | ICD-10-CM | POA: Diagnosis not present

## 2017-04-28 DIAGNOSIS — Z9889 Other specified postprocedural states: Secondary | ICD-10-CM | POA: Diagnosis not present

## 2017-04-28 DIAGNOSIS — I2510 Atherosclerotic heart disease of native coronary artery without angina pectoris: Secondary | ICD-10-CM | POA: Diagnosis not present

## 2017-04-28 DIAGNOSIS — F329 Major depressive disorder, single episode, unspecified: Secondary | ICD-10-CM

## 2017-04-28 DIAGNOSIS — I4729 Other ventricular tachycardia: Secondary | ICD-10-CM

## 2017-04-28 DIAGNOSIS — I472 Ventricular tachycardia: Secondary | ICD-10-CM

## 2017-04-28 DIAGNOSIS — I2583 Coronary atherosclerosis due to lipid rich plaque: Secondary | ICD-10-CM | POA: Diagnosis not present

## 2017-04-28 DIAGNOSIS — I10 Essential (primary) hypertension: Secondary | ICD-10-CM | POA: Diagnosis not present

## 2017-04-28 DIAGNOSIS — F32A Depression, unspecified: Secondary | ICD-10-CM

## 2017-04-28 LAB — CUP PACEART INCLINIC DEVICE CHECK
Implantable Lead Implant Date: 20160301
Implantable Lead Location: 753859
Implantable Pulse Generator Implant Date: 20160301
MDC IDC LEAD IMPLANT DT: 20160301
MDC IDC LEAD LOCATION: 753860
MDC IDC SESS DTM: 20190206102432

## 2017-04-28 NOTE — Patient Instructions (Addendum)
Medication Instructions:   Your physician recommends that you continue on your current medications as directed. Please refer to the Current Medication list given to you today.   If you need a refill on your cardiac medications before your next appointment, please call your pharmacy.  Labwork: NONE ORDERED  TODAY    Testing/Procedures:  NONE ORDERED  TODAY    Follow-Up  Your physician wants you to follow-up in: [place] will receive a reminder letter in the mail two months in advance. If you don't receive a letter, please call our office to schedule the follow-up appointment.   Remote monitoring is used to monitor your Pacemaker of ICD from home. This monitoring reduces the number of office visits required to check your device to one time per year. It allows [place] to keep an eye on the functioning of your device to ensure it is working properly. You are scheduled for a device check from home on .07-14-17   You may send your transmission at any time that day. If you have a wireless device, the transmission will be sent automatically. After your physician reviews your transmission, you will receive a postcard with your next transmission date.      Any Other Special Instructions Will Be Listed Below (If Applicable).

## 2017-05-03 ENCOUNTER — Ambulatory Visit (INDEPENDENT_AMBULATORY_CARE_PROVIDER_SITE_OTHER): Payer: PPO | Admitting: Family Medicine

## 2017-05-03 ENCOUNTER — Encounter: Payer: Self-pay | Admitting: Family Medicine

## 2017-05-03 VITALS — BP 130/80 | HR 86 | Ht 68.0 in | Wt 193.2 lb

## 2017-05-03 DIAGNOSIS — E559 Vitamin D deficiency, unspecified: Secondary | ICD-10-CM

## 2017-05-03 DIAGNOSIS — F321 Major depressive disorder, single episode, moderate: Secondary | ICD-10-CM | POA: Diagnosis not present

## 2017-05-03 DIAGNOSIS — G4733 Obstructive sleep apnea (adult) (pediatric): Secondary | ICD-10-CM

## 2017-05-03 MED ORDER — PAROXETINE HCL 20 MG PO TABS: 20.0000 mg | ORAL_TABLET | Freq: Every day | ORAL | 2 refills | Status: DC

## 2017-05-03 NOTE — Progress Notes (Signed)
Subjective:  Patient ID: [name], male    DOB: [birth year]  Age: 62 y.o. MRN: [id number]  CC: Follow-up   HPI SAL SPRATLEY presents for presents here with his wife this morning feeling much better on the Paxil.  His wife says that he is in a much more agreeable mood.  His close friend did pass over this past month and he is dealing with that.  He says that the trazodone has not been helping so much with sleep.  He is scheduled for CPAP Supplies tomorrow morning.  His CPAP machine has not been working well and this could be affecting his sleep as well.  His pacemaker was adjusted to increase his heart rate when he goes to exercise.  He is having no shortness of breath or chest pain.  It was brought up that his metoprolol may be adding to the depression.  He did have the high-dose vitamin D fill.  Outpatient Medications Prior to Visit  Medication Sig Dispense Refill  . acetaminophen (TYLENOL) 325 MG tablet Take 325 mg by mouth every 6 (six) hours as needed (pain).     Marland Kitchen amLODipine (NORVASC) 10 MG tablet TAKE 1 TABLET (10 MG TOTAL) BY MOUTH DAILY. 90 tablet 2  . aspirin EC 81 MG tablet Take 1 tablet (81 mg total) by mouth daily.    . Blood Glucose Monitoring Suppl (ONE TOUCH ULTRA MINI) w/Device KIT Use meter to check blood sugars 1-4 times daily as instructed. 1 each 0  . ezetimibe (ZETIA) 10 MG tablet Take 1 tablet (10 mg total) by mouth daily. 30 tablet 11  . glucose blood (ONE TOUCH ULTRA TEST) test strip Use one strip per test. Test blood sugars 1-4 times daily as instructed. 100 each 12  . Lancets Misc. (ONE TOUCH SURESOFT) MISC Use 1 lancet per test. Test blood sugars 1-4 times per day as instructed. 1 each 1  . levothyroxine (SYNTHROID, LEVOTHROID) 75 MCG tablet TAKE 1 TABLET (75 MCG TOTAL) BY MOUTH DAILY BEFORE BREAKFAST. 90 tablet 2  . loratadine (CLARITIN) 10 MG tablet Take 1 tablet (10 mg total) by mouth daily.    . metFORMIN (GLUCOPHAGE) 500 MG tablet Take 1 tablet (500 mg  total) by mouth 2 (two) times daily with a meal. 60 tablet 3  . metoprolol tartrate (LOPRESSOR) 50 MG tablet Take 1 tablet (50 mg total) by mouth 2 (two) times daily. 180 tablet 2  . omega-3 acid ethyl esters (LOVAZA) 1 g capsule Take 2 capsules (2 g total) by mouth 2 (two) times daily. 360 capsule 2  . traZODone (DESYREL) 50 MG tablet Take 0.5-1 tablets (25-50 mg total) by mouth at bedtime as needed for sleep. 30 tablet 2  . Vitamin D, Ergocalciferol, (DRISDOL) 50000 units CAPS capsule Take 1 capsule (50,000 Units total) by mouth every 7 (seven) days. 30 capsule 1  . warfarin (COUMADIN) 5 MG tablet TAKE AS DIRECTED BY ANTICOAGULATION CLINIC 40 tablet 3  . PARoxetine (PAXIL) 10 MG tablet Take one at night for a week and then increase to 2 at night. 60 tablet 0   No facility-administered medications prior to visit.     ROS Review of Systems  Constitutional: Negative.   Respiratory: Negative.   Cardiovascular: Negative.   Gastrointestinal: Negative.   Psychiatric/Behavioral: Positive for sleep disturbance. Negative for decreased concentration, dysphoric mood, self-injury and suicidal ideas. The patient is not nervous/anxious.     Objective:  BP 130/80 (BP Location: Left Arm, Patient Position: Sitting,  Cuff Size: Normal)   Pulse 86   Ht 5' 8"  (1.727 m)   Wt 193 lb 4 oz (87.7 kg)   SpO2 96%   BMI 29.38 kg/m   BP Readings from Last 3 Encounters:  05/03/17 130/80  04/28/17 124/72  04/01/17 124/78    Wt Readings from Last 3 Encounters:  05/03/17 193 lb 4 oz (87.7 kg)  04/28/17 193 lb (87.5 kg)  04/01/17 197 lb (89.4 kg)    Physical Exam  Constitutional: He is oriented to person, place, and time. He appears well-developed and well-nourished. No distress.  HENT:  Head: Normocephalic and atraumatic.  Right Ear: External ear normal.  Left Ear: External ear normal.  Eyes: Right eye exhibits no discharge. Left eye exhibits no discharge. No scleral icterus.  Neck: Neck supple. No  JVD present.  Pulmonary/Chest: Effort normal. No stridor.  Lymphadenopathy:    He has no cervical adenopathy.  Neurological: He is alert and oriented to person, place, and time.  Skin: Skin is warm and dry. He is not diaphoretic.  Psychiatric: He has a normal mood and affect. His behavior is normal.    Lab Results  Component Value Date   WBC 16.0 (H) 03/03/2016   HGB 18.0 (H) 03/03/2016   HCT 52.8 (H) 03/03/2016   PLT 224 03/03/2016   GLUCOSE 73 04/01/2017   CHOL 160 04/22/2017   TRIG 307 (H) 04/22/2017   HDL 22 (L) 04/22/2017   LDLDIRECT 62.0 12/13/2014   LDLCALC 77 04/22/2017   ALT 30 04/01/2017   AST 21 04/01/2017   NA 139 04/01/2017   K 4.5 04/01/2017   CL 106 04/01/2017   CREATININE 1.58 (H) 04/01/2017   BUN 20 04/01/2017   CO2 25 04/01/2017   TSH 0.74 04/01/2017   INR 3.3 04/22/2017   HGBA1C 6.0 04/01/2017   MICROALBUR 6.5 (H) 04/01/2017    Dg Chest 2 View  Result Date: 01/29/2017 CLINICAL DATA:  Follow-up right renal carcinoma EXAM: CHEST  2 VIEW COMPARISON:  03/03/2016 FINDINGS: Cardiac shadow is stable. Postsurgical changes are noted. Pacing device is again seen and stable. Lungs are well aerated bilaterally. Mild right basilar scarring is again seen and stable. No acute bony abnormality is noted. IMPRESSION: No active cardiopulmonary disease. Electronically Signed   By: Inez Catalina M.D.   On: 01/29/2017 13:32    Assessment & Plan:   Tai was seen today for follow-up.  Diagnoses and all orders for this visit:  Vitamin D deficiency  Current moderate episode of major depressive disorder, unspecified whether recurrent (HCC) -     PARoxetine (PAXIL) 20 MG tablet; Take 1 tablet (20 mg total) by mouth daily.  OSA (obstructive sleep apnea)   I have discontinued [name]. Plotner's PARoxetine. I am also having him start on PARoxetine. Additionally, I am having him maintain his acetaminophen, aspirin EC, loratadine, omega-3 acid ethyl esters, glucose blood, ONE  TOUCH ULTRA MINI, ONE TOUCH SURESOFT, warfarin, metoprolol tartrate, levothyroxine, traZODone, Vitamin D (Ergocalciferol), metFORMIN, amLODipine, and ezetimibe.  Meds ordered this encounter  Medications  . PARoxetine (PAXIL) 20 MG tablet    Sig: Take 1 tablet (20 mg total) by mouth daily.    Dispense:  30 tablet    Refill:  2   He will continue the Paxil 20 mg/day.  Suggested that they try over-the-counter melatonin 7.5 mg to 10 mg.  They will update his CPAP machine hopefully that will help him sleep as well.  Will call in a prescription strength  form of melatonin if need be.  They will schedule appointment with a cardiologist to see about his metoprolol.  We are all aware that it could be adding to his depression.  He was definitely smiling today and appeared to be in a much better mood.  Follow-up in 2-3 months.  Follow-up: No Follow-up on file.  Libby Maw, MD

## 2017-05-04 ENCOUNTER — Encounter: Payer: Self-pay | Admitting: Pulmonary Disease

## 2017-05-04 ENCOUNTER — Ambulatory Visit: Payer: PPO | Admitting: Pulmonary Disease

## 2017-05-04 VITALS — BP 134/82 | HR 88 | Ht 68.0 in | Wt 193.6 lb

## 2017-05-04 DIAGNOSIS — Z9989 Dependence on other enabling machines and devices: Secondary | ICD-10-CM

## 2017-05-04 DIAGNOSIS — G4733 Obstructive sleep apnea (adult) (pediatric): Secondary | ICD-10-CM

## 2017-05-04 DIAGNOSIS — G4700 Insomnia, unspecified: Secondary | ICD-10-CM | POA: Diagnosis not present

## 2017-05-04 NOTE — Progress Notes (Signed)
[facility]  Chief Complaint  Patient presents with  . Follow-up    Pt has cpap machine with [facility], needing new supplies. Pt needs to change DME companies because insurance is out of network.     Vital signs: BP 134/82 (BP Location: Left Arm, Cuff Size: Normal)   Pulse 88   Ht 5' 8"  (1.727 m)   Wt 193 lb 9.6 oz (87.8 kg)   SpO2 98%   BMI 29.44 kg/m   History of Present Illness: Kevin English is a 62 y.o. male former smoker with OSA.  He was previously seen by Dr. Corrie Dandy.  He had sleep study in March 2011 through Dr. Alcide Clever in [place].  He has been using CPAP for several years.  He got a new CPAP about 3 years ago.  He doesn't have any issue with mask fit.  He needs to switch to a new DME due to insurance changes.  He has trouble feeling sleeping during the day and then having trouble sleeping when he wants.  He was found to have renal cancer and valvular heart disease a few years ago.  He had surgery for both.  His wife has been concerned that he hasn't been the same since.  He gets anxious about going to bed.  He would like to go to sleep at 7 pm.  He sleeps on and off during the night.  He usually gets out of bed at 7 or 8 am.  He will sometimes fall asleep during the day while watching TV.  He was tried on trazodone 50 mg for several weeks, but this didn't seem to help.   Physical Exam:  General - pleasant Eyes - pupils reactive ENT - no sinus tenderness, no oral exudate, no LAN Cardiac - regular, no murmur Chest - no wheeze, rales Abd - soft, non tender Ext - no edema Skin - no rashes Neuro - normal strength Psych - normal mood   CXR 01/29/17 >> right basilar scarring   CMP Latest Ref Rng & Units 04/01/2017 10/08/2016 03/03/2016  Glucose 70 - 99 mg/dL 73 - 254(H)  BUN 6 - 23 mg/dL 20 19 17   Creatinine 0.40 - 1.50 mg/dL 1.58(H) 1.2 1.44(H)  Sodium 135 - 145 mEq/L 139 134(A) 138  Potassium 3.5 - 5.1 mEq/L 4.5  4.5 4.3  Chloride 96 - 112 mEq/L 106 - 109  CO2 19 - 32 mEq/L 25 - 21(L)  Calcium 8.4 - 10.5 mg/dL 10.1 - 9.7  Total Protein 6.0 - 8.3 g/dL 7.5 - 6.7  Total Bilirubin 0.2 - 1.2 mg/dL 0.4 - 0.6  Alkaline Phos 39 - 117 U/L 68 129(A) 70  AST 0 - 37 U/L 21 23 30   ALT 0 - 53 U/L 30 29 31     CBC Latest Ref Rng & Units 03/03/2016 01/09/2016 01/02/2016  WBC 4.0 - 10.5 K/uL 16.0(H) 16.2(H) 27.7(H)  Hemoglobin 13.0 - 17.0 g/dL 18.0(H) 17.3(H) 17.2(H)  Hematocrit 39.0 - 52.0 % 52.8(H) 52.1(H) 50.2(H)  Platelets 150 - 400 K/uL 224 241 234     Assessment/Plan:  Obstructive sleep apnea. - he is compliant with CPAP and reports benefit from therapy - continue CPAP 8 cm H2O - will arrange for new DME for CPAP mask and supplies - explained he wouldn't be eligible for new machine until his current device is more than 64 years old  Insomnia. - discuss stimulus control, relaxation techniques, and sleep restriction - advised him  to maintain a regular sleep-wake schedule and avoid sleeping outside this time period - reviewed proper sleep hygiene - prefer to defer adding sleep aid medication at this time since it seems much of his issues might be amenable to behavioral modifications   Patient Instructions  Try restricting your time in bed  Call or email if your sleep pattern doesn't improve after a few weeks, and then we can discuss starting a sleep aide medication  Follow up in 6 months   Chesley Mires, MD [place] 05/04/2017, 11:16 AM Pager:  [phone]  Flow Sheet  Pulmonary tests: PFT 04/16/14 >> FEV1 3.01 (90%), FEV1% 79, TLC 5.60 (85%), DLCO 74% PFT 02/21/16 >> FEV1 2.40 (73%), FEV1% 75, TLC 4.58 (69%), DLCO 63%  Sleep tests: PSG 05/31/09 >> AHI 15, SaO2 low 88% CPAP 02/03/17 to 05/03/16 >> used on 90 of 90 nights with average 12 hrs 7 min.  Average AHI 1 with CPAP 8 cm H2O.  Cardiac tests: Echo 05/01/16 >> EF 55 to 60%, mild LVH  Past Medical History: He   has a past medical history of Anxiety, Borderline diabetes, CAD (coronary artery disease), CKD (chronic kidney disease), stage II, Complete heart block ([place]), Deafness in left ear, Depression, Diabetes mellitus without complication ([place]), Diverticulitis of colon (15 years ago), Hernia, HTN (hypertension), Hyperlipidemia, Hypogonadism male, Hypothyroidism, Lung nodule seen on imaging study (04/10/2014), Mitral stenosis, Obesity, OSA on CPAP, Persistent atrial fibrillation ([place]), Renal cell carcinoma of right kidney ([place]) (09/05/2013), Renal mass, Rheumatic heart disease, S/P Minimally invasive maze operation for atrial fibrillation (05/15/2014), S/P minimally invasive mitral valve replacement with metallic valve and maze procedure (05/15/2014), and Stroke ([place]) (1998).  Past Surgical History: He  has a past surgical history that includes TEE without cardioversion (N/A, 08/16/2013); Inguinal hernia repair (Bilateral, first one in 80's); Eye surgery (Right, 1990); Robot assisted laparoscopic nephrectomy (Right, 09/05/2013); left and right heart catheterization with coronary angiogram (N/A, 08/28/2013); Mitral valve replacement (Right, 05/15/2014); Minimally invasive maze procedure (N/A, 05/15/2014); TEE without cardioversion (N/A, 05/15/2014); and permanent pacemaker insertion (Left, 05/22/2014).  Family History: His family history includes Heart attack in his mother; Hypertension in his mother and unknown relative; Stroke in his mother.  Social History: He  reports that he has been smoking cigarettes and cigars.  He has a 10.00 pack-year smoking history. he has never used smokeless tobacco. He reports that he does not drink alcohol or use drugs.  Medications: Allergies as of 05/04/2017      Reactions   Contrast Media [iodinated Diagnostic Agents] Hives, Other (See Comments)   Patient started sneezing and coughing, patient broke out in hives, patient needs 13 hour prep if having IV contrast   Ioxaglate Hives, Rash     Patient started sneezing and coughing, patient broke out in hives, patient needs 13 hour prep if having IV contrast   Atorvastatin Other (See Comments)   Myalgias   Crestor [rosuvastatin] Other (See Comments)   Myalgias   Fenofibrate Other (See Comments)   MYALGIAS   Pravastatin Other (See Comments)   Myalgias   Pseudoephedrine Hcl Palpitations      Medication List        Accurate as of 05/04/17 11:16 AM. Always use your most recent med list.          acetaminophen 325 MG tablet Commonly known as:  TYLENOL Take 325 mg by mouth every 6 (six) hours as needed (pain).   amLODipine 10 MG tablet Commonly known as:  NORVASC TAKE  1 TABLET (10 MG TOTAL) BY MOUTH DAILY.   aspirin EC 81 MG tablet Take 1 tablet (81 mg total) by mouth daily.   ezetimibe 10 MG tablet Commonly known as:  ZETIA Take 1 tablet (10 mg total) by mouth daily.   glucose blood test strip Commonly known as:  ONE TOUCH ULTRA TEST Use one strip per test. Test blood sugars 1-4 times daily as instructed.   levothyroxine 75 MCG tablet Commonly known as:  SYNTHROID, LEVOTHROID TAKE 1 TABLET (75 MCG TOTAL) BY MOUTH DAILY BEFORE BREAKFAST.   loratadine 10 MG tablet Commonly known as:  CLARITIN Take 1 tablet (10 mg total) by mouth daily.   metFORMIN 500 MG tablet Commonly known as:  GLUCOPHAGE Take 1 tablet (500 mg total) by mouth 2 (two) times daily with a meal.   metoprolol tartrate 50 MG tablet Commonly known as:  LOPRESSOR Take 1 tablet (50 mg total) by mouth 2 (two) times daily.   omega-3 acid ethyl esters 1 g capsule Commonly known as:  LOVAZA Take 2 capsules (2 g total) by mouth 2 (two) times daily.   ONE TOUCH SURESOFT Misc Use 1 lancet per test. Test blood sugars 1-4 times per day as instructed.   ONE TOUCH ULTRA MINI w/Device Kit Use meter to check blood sugars 1-4 times daily as instructed.   PARoxetine 20 MG tablet Commonly known as:  PAXIL Take 1 tablet (20 mg total) by mouth  daily.   traZODone 50 MG tablet Commonly known as:  DESYREL Take 0.5-1 tablets (25-50 mg total) by mouth at bedtime as needed for sleep.   Vitamin D (Ergocalciferol) 50000 units Caps capsule Commonly known as:  DRISDOL Take 1 capsule (50,000 Units total) by mouth every 7 (seven) days.   warfarin 5 MG tablet Commonly known as:  COUMADIN Take as directed by the anticoagulation clinic. If you are unsure how to take this medication, talk to your nurse or doctor. Original instructions:  TAKE AS DIRECTED BY ANTICOAGULATION CLINIC

## 2017-05-04 NOTE — Patient Instructions (Signed)
Try restricting your time in bed  Call or email if your sleep pattern doesn't improve after a few weeks, and then we can discuss starting a sleep aide medication  Follow up in 6 months

## 2017-05-13 ENCOUNTER — Ambulatory Visit (INDEPENDENT_AMBULATORY_CARE_PROVIDER_SITE_OTHER): Payer: PPO | Admitting: *Deleted

## 2017-05-13 ENCOUNTER — Telehealth: Payer: Self-pay | Admitting: Pulmonary Disease

## 2017-05-13 ENCOUNTER — Ambulatory Visit (HOSPITAL_COMMUNITY)
Admission: RE | Admit: 2017-05-13 | Discharge: 2017-05-13 | Disposition: A | Discharge status: Home / Self Care | Payer: PPO | Source: Ambulatory Visit | Attending: Nurse Practitioner | Admitting: Nurse Practitioner

## 2017-05-13 ENCOUNTER — Encounter (HOSPITAL_COMMUNITY): Payer: Self-pay | Admitting: Nurse Practitioner

## 2017-05-13 VITALS — BP 122/76 | HR 73 | Ht 68.0 in | Wt 195.0 lb

## 2017-05-13 DIAGNOSIS — Z8673 Personal history of transient ischemic attack (TIA), and cerebral infarction without residual deficits: Secondary | ICD-10-CM | POA: Insufficient documentation

## 2017-05-13 DIAGNOSIS — Z954 Presence of other heart-valve replacement: Secondary | ICD-10-CM

## 2017-05-13 DIAGNOSIS — Z9889 Other specified postprocedural states: Secondary | ICD-10-CM

## 2017-05-13 DIAGNOSIS — N182 Chronic kidney disease, stage 2 (mild): Secondary | ICD-10-CM | POA: Insufficient documentation

## 2017-05-13 DIAGNOSIS — E785 Hyperlipidemia, unspecified: Secondary | ICD-10-CM | POA: Diagnosis not present

## 2017-05-13 DIAGNOSIS — I2510 Atherosclerotic heart disease of native coronary artery without angina pectoris: Secondary | ICD-10-CM | POA: Insufficient documentation

## 2017-05-13 DIAGNOSIS — Z79899 Other long term (current) drug therapy: Secondary | ICD-10-CM | POA: Insufficient documentation

## 2017-05-13 DIAGNOSIS — Z8679 Personal history of other diseases of the circulatory system: Secondary | ICD-10-CM

## 2017-05-13 DIAGNOSIS — F419 Anxiety disorder, unspecified: Secondary | ICD-10-CM | POA: Insufficient documentation

## 2017-05-13 DIAGNOSIS — I5033 Acute on chronic diastolic (congestive) heart failure: Secondary | ICD-10-CM

## 2017-05-13 DIAGNOSIS — I4510 Unspecified right bundle-branch block: Secondary | ICD-10-CM | POA: Insufficient documentation

## 2017-05-13 DIAGNOSIS — I4891 Unspecified atrial fibrillation: Secondary | ICD-10-CM | POA: Diagnosis not present

## 2017-05-13 DIAGNOSIS — I5030 Unspecified diastolic (congestive) heart failure: Secondary | ICD-10-CM | POA: Insufficient documentation

## 2017-05-13 DIAGNOSIS — F329 Major depressive disorder, single episode, unspecified: Secondary | ICD-10-CM | POA: Insufficient documentation

## 2017-05-13 DIAGNOSIS — Z952 Presence of prosthetic heart valve: Secondary | ICD-10-CM | POA: Insufficient documentation

## 2017-05-13 DIAGNOSIS — Z7984 Long term (current) use of oral hypoglycemic drugs: Secondary | ICD-10-CM | POA: Diagnosis not present

## 2017-05-13 DIAGNOSIS — Z0000 Encounter for general adult medical examination without abnormal findings: Secondary | ICD-10-CM

## 2017-05-13 DIAGNOSIS — F17290 Nicotine dependence, other tobacco product, uncomplicated: Secondary | ICD-10-CM | POA: Diagnosis not present

## 2017-05-13 DIAGNOSIS — I480 Paroxysmal atrial fibrillation: Secondary | ICD-10-CM | POA: Diagnosis not present

## 2017-05-13 DIAGNOSIS — K5732 Diverticulitis of large intestine without perforation or abscess without bleeding: Secondary | ICD-10-CM | POA: Insufficient documentation

## 2017-05-13 DIAGNOSIS — E1122 Type 2 diabetes mellitus with diabetic chronic kidney disease: Secondary | ICD-10-CM | POA: Insufficient documentation

## 2017-05-13 DIAGNOSIS — Z85528 Personal history of other malignant neoplasm of kidney: Secondary | ICD-10-CM | POA: Diagnosis not present

## 2017-05-13 DIAGNOSIS — G4733 Obstructive sleep apnea (adult) (pediatric): Secondary | ICD-10-CM | POA: Insufficient documentation

## 2017-05-13 DIAGNOSIS — H9192 Unspecified hearing loss, left ear: Secondary | ICD-10-CM | POA: Diagnosis not present

## 2017-05-13 DIAGNOSIS — I050 Rheumatic mitral stenosis: Secondary | ICD-10-CM

## 2017-05-13 DIAGNOSIS — Z7901 Long term (current) use of anticoagulants: Secondary | ICD-10-CM | POA: Insufficient documentation

## 2017-05-13 DIAGNOSIS — I481 Persistent atrial fibrillation: Secondary | ICD-10-CM | POA: Insufficient documentation

## 2017-05-13 DIAGNOSIS — E669 Obesity, unspecified: Secondary | ICD-10-CM | POA: Insufficient documentation

## 2017-05-13 DIAGNOSIS — Z7982 Long term (current) use of aspirin: Secondary | ICD-10-CM | POA: Insufficient documentation

## 2017-05-13 DIAGNOSIS — Z5181 Encounter for therapeutic drug level monitoring: Secondary | ICD-10-CM | POA: Diagnosis not present

## 2017-05-13 DIAGNOSIS — I130 Hypertensive heart and chronic kidney disease with heart failure and stage 1 through stage 4 chronic kidney disease, or unspecified chronic kidney disease: Secondary | ICD-10-CM | POA: Diagnosis not present

## 2017-05-13 DIAGNOSIS — E039 Hypothyroidism, unspecified: Secondary | ICD-10-CM | POA: Diagnosis not present

## 2017-05-13 LAB — POCT INR: INR: 3.1

## 2017-05-13 NOTE — Progress Notes (Signed)
Primary Care Physician: Libby Maw, MD Referring Physician: Dr. 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   He is in the afib clinic for long term surveillance of afib s/p maze procedure. He received a PPM at time of surgery.Recent interrogations  of his device have not shown any afib. He was seen by Chanetta Marshall, NP in device clinic, 2/6 and he was c/o fatigue, settings were made on his device to see if this could be improved. in the clinic today, he has not seen any improvement as far as fatigue and discussed trying decrease  BB.    Today, he denies symptoms of palpitations, chest pain, shortness of breath, orthopnea, PND, lower extremity edema, dizziness, presyncope, syncope, or neurologic sequela. + for fatigue.The patient is tolerating medications without difficulties and is otherwise without complaint today.   Past Medical History:  Diagnosis Date  . Anxiety   . Borderline diabetes   . CAD (coronary artery disease)    a. By cath 08/2013: 70% distal cx-prox LPDA; tandem mod LAD lesions, o/w mild dz.  . CKD (chronic kidney disease), stage II    his creat. runs 2-2.5; hasnt seen neph yet - sees dr. Tresa Moore at [facility]  . Complete heart block (HCC)    a. s/p PPM   . Deafness in left ear   . Depression   . Diabetes mellitus without complication ([place])   . Diverticulitis of colon 15 years ago  . Hernia   . HTN (hypertension)   . Hyperlipidemia   . Hypogonadism male   . Hypothyroidism   . Lung nodule seen on imaging study 04/10/2014   5 mm nodule right lower lobe  . Mitral stenosis    a. Dx 07/2013 - through workup of 2D echo, TEE, and cath, felt to be moderate.  . Obesity   . OSA on CPAP   . Persistent  atrial fibrillation ([place])    a. sustained 160-180 on e-CARDIO monitor placed 07/29/2013. Placed on IV amiodarone at end of May 2015 due to continued paroxysms with RVR.  Marland Kitchen Renal cell carcinoma of right kidney ([place]) 09/05/2013   clear cell renal cell carcinoma of right kidney treated by radical nephrectomy, Fuhrman grade 2-3, with maximum tumor diameter 2.7 cm, all tumor to find confined to the kidney, and all surgical margins negative (T1aN0).   . Renal mass    a. Dx 08/2013: concerning for renal cancer.  . Rheumatic heart disease   . S/P Minimally invasive maze operation for atrial fibrillation 05/15/2014   Left side lesion set using cryothermy with clipping of LA appendage  . S/P minimally invasive mitral valve replacement with metallic valve and maze procedure 05/15/2014   58m Sorin [employer]omed[employer] Optiform mechanical valve placed via right mini thoracotomy approach  . Stroke ([facility] 1998   "MRI showed 3 mild strokes"   Past Surgical History:  Procedure Laterality Date  . EYE SURGERY Right 1990   "reconstructive, lens implant- from paintball accident"  . INGUINAL HERNIA REPAIR Bilateral first one in 80's   "I had one side done twice"  . LEFT AND RIGHT HEART CATHETERIZATION WITH CORONARY ANGIOGRAM N/A 08/28/2013   Procedure: LEFT AND RIGHT HEART CATHETERIZATION WITH CORONARY ANGIOGRAM;  Surgeon: DShanon Brow  Loren Racer, MD;  Location: [facility] CATH LAB;  Service: Cardiovascular;  Laterality: N/A;  . MINIMALLY INVASIVE MAZE PROCEDURE N/A 05/15/2014   Procedure: MINIMALLY INVASIVE MAZE PROCEDURE;  Surgeon: Rexene Alberts, MD;  Location: [place];  Service: Open Heart Surgery;  Laterality: N/A;  . MITRAL VALVE REPLACEMENT Right 05/15/2014   Procedure: MINIMALLY INVASIVE MITRAL VALVE (MV) REPLACEMENT;  Surgeon: Rexene Alberts, MD;  Location: [place];  Service: Open Heart Surgery;  Laterality: Right;  . PERMANENT PACEMAKER INSERTION Left 05/22/2014   a. MDT dual chamber PPM implanted by Dr Rayann Heman for CHB s/p MAZE/MVR  .  ROBOT ASSISTED LAPAROSCOPIC NEPHRECTOMY Right 09/05/2013   Procedure: ROBOTIC ASSISTED LAPAROSCOPIC RIGHT RADICAL NEPHRECTOMY;  Surgeon: Sharyn Creamer, MD;  Location: [facility];  Service: Urology;  Laterality: Right;  . TEE WITHOUT CARDIOVERSION N/A 08/16/2013   Procedure: TRANSESOPHAGEAL ECHOCARDIOGRAM (TEE);  Surgeon: Dorothy Spark, MD;  Location: [place];  Service: Cardiovascular;  Laterality: N/A;  . TEE WITHOUT CARDIOVERSION N/A 05/15/2014   Procedure: TRANSESOPHAGEAL ECHOCARDIOGRAM (TEE);  Surgeon: Rexene Alberts, MD;  Location: [place];  Service: Open Heart Surgery;  Laterality: N/A;    Current Outpatient Medications  Medication Sig Dispense Refill  . acetaminophen (TYLENOL) 325 MG tablet Take 325 mg by mouth every 6 (six) hours as needed (pain).     Marland Kitchen amLODipine (NORVASC) 10 MG tablet TAKE 1 TABLET (10 MG TOTAL) BY MOUTH DAILY. 90 tablet 2  . aspirin EC 81 MG tablet Take 1 tablet (81 mg total) by mouth daily.    . Blood Glucose Monitoring Suppl (ONE TOUCH ULTRA MINI) w/Device KIT Use meter to check blood sugars 1-4 times daily as instructed. 1 each 0  . ezetimibe (ZETIA) 10 MG tablet Take 1 tablet (10 mg total) by mouth daily. 30 tablet 11  . glucose blood (ONE TOUCH ULTRA TEST) test strip Use one strip per test. Test blood sugars 1-4 times daily as instructed. 100 each 12  . Lancets Misc. (ONE TOUCH SURESOFT) MISC Use 1 lancet per test. Test blood sugars 1-4 times per day as instructed. 1 each 1  . levothyroxine (SYNTHROID, LEVOTHROID) 75 MCG tablet TAKE 1 TABLET (75 MCG TOTAL) BY MOUTH DAILY BEFORE BREAKFAST. 90 tablet 2  . loratadine (CLARITIN) 10 MG tablet Take 1 tablet (10 mg total) by mouth daily.    Marland Kitchen MELATONIN GUMMIES PO Take by mouth. Pt states taking 2 tablets 20 mg at night but making him more sleepy so taking 1 at night    . metFORMIN (GLUCOPHAGE) 500 MG tablet Take 1 tablet (500 mg total) by mouth 2 (two) times daily with a meal. 60 tablet 3  . metoprolol tartrate  (LOPRESSOR) 50 MG tablet Take 1 tablet (50 mg total) by mouth 2 (two) times daily. (Patient taking differently: Take 25 mg by mouth 2 (two) times daily. ) 180 tablet 2  . omega-3 acid ethyl esters (LOVAZA) 1 g capsule Take 2 capsules (2 g total) by mouth 2 (two) times daily. 360 capsule 2  . PARoxetine (PAXIL) 20 MG tablet Take 1 tablet (20 mg total) by mouth daily. 30 tablet 2  . Vitamin D, Ergocalciferol, (DRISDOL) 50000 units CAPS capsule Take 1 capsule (50,000 Units total) by mouth every 7 (seven) days. 30 capsule 1  . warfarin (COUMADIN) 5 MG tablet TAKE AS DIRECTED BY ANTICOAGULATION CLINIC 40 tablet 3   No current facility-administered medications for this encounter.     Allergies  Allergen Reactions  . Contrast Media [  Iodinated Diagnostic Agents] Hives and Other (See Comments)    Patient started sneezing and coughing, patient broke out in hives, patient needs 13 hour prep if having IV contrast  . Ioxaglate Hives and Rash    Patient started sneezing and coughing, patient broke out in hives, patient needs 13 hour prep if having IV contrast  . Atorvastatin Other (See Comments)    Myalgias  . Crestor [Rosuvastatin] Other (See Comments)    Myalgias  . Fenofibrate Other (See Comments)    MYALGIAS   . Pravastatin Other (See Comments)    Myalgias   . Pseudoephedrine Hcl Palpitations    Social History   Socioeconomic History  . Marital status: Married    Spouse name: Not on file  . Number of children: 2  . Years of education: 85  . Highest education level: Not on file  Social Needs  . Financial resource strain: Not on file  . Food insecurity - worry: Not on file  . Food insecurity - inability: Not on file  . Transportation needs - medical: Not on file  . Transportation needs - non-medical: Not on file  Occupational History  . Occupation: Retired   Tobacco Use  . Smoking status: Current Some Day Smoker    Packs/day: 0.25    Years: 40.00    Pack years: 10.00    Types:  Cigarettes, Cigars    Last attempt to quit: 03/02/2016    Years since quitting: 1.1  . Smokeless tobacco: Never Used  . Tobacco comment: Cigar every now and then  Substance and Sexual Activity  . Alcohol use: No    Alcohol/week: 0.0 oz  . Drug use: No  . Sexual activity: Not Currently  Other Topics Concern  . Not on file  Social History Narrative   Fun: [employer] people.     Family History  Problem Relation Age of Onset  . Hypertension Mother   . Heart attack Mother   . Stroke Mother   . Hypertension Unknown     ROS- All systems are reviewed and negative except as per the HPI above  Physical Exam: Vitals:   05/13/17 1024  BP: 122/76  Pulse: 73  Weight: 195 lb (88.5 kg)  Height: _0  (1.727 m)   Wt Readings from Last 3 Encounters:  05/13/17 195 lb (88.5 kg)  05/04/17 193 lb 9.6 oz (87.8 kg)  05/03/17 193 lb 4 oz (87.7 kg)    Labs: Lab Results  Component Value Date   NA 139 04/01/2017   K 4.5 04/01/2017   CL 106 04/01/2017   CO2 25 04/01/2017   GLUCOSE 73 04/01/2017   BUN 20 04/01/2017   CREATININE 1.58 (H) 04/01/2017   CALCIUM 10.1 04/01/2017   MG 2.7 (H) 05/16/2014   Lab Results  Component Value Date   INR 3.1 05/13/2017   Lab Results  Component Value Date   CHOL 160 04/22/2017   HDL 22 (L) 04/22/2017   LDLCALC 77 04/22/2017   TRIG 307 (H) 04/22/2017     GEN- The patient is well appearing, alert and oriented x 3 today.   Head- normocephalic, atraumatic Eyes-  Sclera clear, conjunctiva pink Ears- hearing intact Oropharynx- clear Neck- supple, no JVP Lymph- no cervical lymphadenopathy Lungs- Clear to ausculation bilaterally, normal work of breathing Heart- Regular rate and rhythm, no murmurs, rubs or gallops, PMI not laterally displaced GI- soft, NT, ND, + BS Extremities- no clubbing, cyanosis, or edema MS- no significant deformity or atrophy Skin- no  rash or lesion Psych- euthymic mood, full affect Neuro- strength and sensation are  intact  EKG-atrial paced rhythm, RBBB, LAFB, pr int 200 ms, qrx int 140 ms, qtc 436 ms Epic records reviewed Remote and in office interrogations reviewed    Assessment and Plan: 1. Afib, s/p MVR and Maze procedure, clipping of LAA No evidence of recurrent afib Pt feels much improved with physical symptoms of shortness of breath but continues to feel fatigue  Will reduce metoprolol from 50 mg bid to 25 mg bid to see if this helps Pt's wife  will call the office in a couple of weeks with the effects of reducing BB Continue warfarin for chadsvasc score of at least 5  2. PPM Per Dr. Rayann Heman   F/u in one year for long term surveillance for presence of afib s/p Maze procedure    Butch Penny C. Royer Cristobal, [facility] [address] [place], [place] [address] [phone]

## 2017-05-13 NOTE — Patient Instructions (Signed)
Decrease Metoprolol to 25 mg in the morning and 25 mg in the evening.  Call us in a couple of weeks to let us know how you are doing with the fatigue

## 2017-05-13 NOTE — Telephone Encounter (Signed)
Spoke with pt's wife, Kevin English. States that they are having a hard time getting the pt's CPAP supplies from [facility]. Reports that [facility] told her that they need face sheets. [name] that I am not sure what they are referring to and I would call [facility] to get clarification. Attempted to contact [facility] with [facility]. There was no answer and I could not leave a message. Will try back later.  Spoke with pt's wife again. Made her aware that we will continue to call [facility] to find out what's going on. She verbalized understanding.

## 2017-05-13 NOTE — Patient Instructions (Signed)
Description   Continue on same dosage 1 tablet daily except take 1.5 tablets Tuesdays, Thursdays, and Saturdays.  Recheck in 4 weeks.  Call [phone] with any questions.     

## 2017-05-14 NOTE — Telephone Encounter (Signed)
lmtcb x1 for pt. 

## 2017-05-14 NOTE — Telephone Encounter (Signed)
Wife Santiago Glad) states [facility] needs office notes so the patient can receive CPAP supplies.. Contact # [phone]

## 2017-05-14 NOTE — Telephone Encounter (Signed)
Faxed pt's last OV to [facility].  Called and spoke with pt's wife Santiago Glad letting her know this information.  Santiago Glad expressed understanding. Nothing further needed at this current time.

## 2017-05-16 ENCOUNTER — Other Ambulatory Visit: Payer: Self-pay | Admitting: Family Medicine

## 2017-05-16 DIAGNOSIS — F321 Major depressive disorder, single episode, moderate: Secondary | ICD-10-CM

## 2017-05-17 NOTE — Telephone Encounter (Signed)
Patient should be taking 20mg  daily.

## 2017-05-24 ENCOUNTER — Other Ambulatory Visit: Payer: Self-pay | Admitting: Cardiology

## 2017-06-02 DIAGNOSIS — G4733 Obstructive sleep apnea (adult) (pediatric): Secondary | ICD-10-CM | POA: Diagnosis not present

## 2017-06-03 DIAGNOSIS — G4733 Obstructive sleep apnea (adult) (pediatric): Secondary | ICD-10-CM | POA: Diagnosis not present

## 2017-06-10 ENCOUNTER — Ambulatory Visit (INDEPENDENT_AMBULATORY_CARE_PROVIDER_SITE_OTHER): Payer: PPO | Admitting: *Deleted

## 2017-06-10 DIAGNOSIS — Z0000 Encounter for general adult medical examination without abnormal findings: Secondary | ICD-10-CM

## 2017-06-10 DIAGNOSIS — Z954 Presence of other heart-valve replacement: Secondary | ICD-10-CM | POA: Diagnosis not present

## 2017-06-10 DIAGNOSIS — I5033 Acute on chronic diastolic (congestive) heart failure: Secondary | ICD-10-CM | POA: Diagnosis not present

## 2017-06-10 DIAGNOSIS — Z5181 Encounter for therapeutic drug level monitoring: Secondary | ICD-10-CM

## 2017-06-10 DIAGNOSIS — I050 Rheumatic mitral stenosis: Secondary | ICD-10-CM

## 2017-06-10 DIAGNOSIS — I4891 Unspecified atrial fibrillation: Secondary | ICD-10-CM | POA: Diagnosis not present

## 2017-06-10 LAB — POCT INR: INR: 2.8

## 2017-06-10 NOTE — Patient Instructions (Signed)
Description   Continue on same dosage 1 tablet daily except take 1.5  tablets Tuesdays, Thursdays, and Saturdays. Recheck in 6 weeks.  Call [phone] with any questions.     

## 2017-06-26 ENCOUNTER — Other Ambulatory Visit: Payer: Self-pay | Admitting: Cardiology

## 2017-07-01 ENCOUNTER — Ambulatory Visit (INDEPENDENT_AMBULATORY_CARE_PROVIDER_SITE_OTHER): Payer: PPO | Admitting: Family Medicine

## 2017-07-01 ENCOUNTER — Encounter: Payer: Self-pay | Admitting: Family Medicine

## 2017-07-01 VITALS — BP 122/80 | HR 86 | Ht 68.0 in | Wt 197.1 lb

## 2017-07-01 DIAGNOSIS — N3945 Continuous leakage: Secondary | ICD-10-CM | POA: Insufficient documentation

## 2017-07-01 DIAGNOSIS — F411 Generalized anxiety disorder: Secondary | ICD-10-CM

## 2017-07-01 DIAGNOSIS — F321 Major depressive disorder, single episode, moderate: Secondary | ICD-10-CM | POA: Diagnosis not present

## 2017-07-01 DIAGNOSIS — E559 Vitamin D deficiency, unspecified: Secondary | ICD-10-CM

## 2017-07-01 DIAGNOSIS — E039 Hypothyroidism, unspecified: Secondary | ICD-10-CM | POA: Diagnosis not present

## 2017-07-01 DIAGNOSIS — H524 Presbyopia: Secondary | ICD-10-CM | POA: Diagnosis not present

## 2017-07-01 DIAGNOSIS — G4733 Obstructive sleep apnea (adult) (pediatric): Secondary | ICD-10-CM | POA: Diagnosis not present

## 2017-07-01 DIAGNOSIS — H52222 Regular astigmatism, left eye: Secondary | ICD-10-CM | POA: Diagnosis not present

## 2017-07-01 DIAGNOSIS — H5211 Myopia, right eye: Secondary | ICD-10-CM | POA: Diagnosis not present

## 2017-07-01 DIAGNOSIS — H52221 Regular astigmatism, right eye: Secondary | ICD-10-CM | POA: Diagnosis not present

## 2017-07-01 LAB — URINALYSIS, ROUTINE W REFLEX MICROSCOPIC
Bilirubin Urine: NEGATIVE
Hgb urine dipstick: NEGATIVE
Leukocytes, UA: NEGATIVE
Nitrite: NEGATIVE
Specific Gravity, Urine: 1.02 (ref 1.000–1.030)
Total Protein, Urine: NEGATIVE
Urine Glucose: NEGATIVE
Urobilinogen, UA: 0.2 (ref 0.0–1.0)
pH: 6.5 (ref 5.0–8.0)

## 2017-07-01 LAB — PSA: PSA: 0.54 ng/mL (ref 0.10–4.00)

## 2017-07-01 LAB — TSH: TSH: 0.65 u[IU]/mL (ref 0.35–4.50)

## 2017-07-01 LAB — VITAMIN D 25 HYDROXY (VIT D DEFICIENCY, FRACTURES): VITD: 39 ng/mL (ref 30.00–100.00)

## 2017-07-01 MED ORDER — PAROXETINE HCL 30 MG PO TABS: 30.0000 mg | ORAL_TABLET | Freq: Every day | ORAL | 2 refills | Status: DC

## 2017-07-01 NOTE — Progress Notes (Signed)
Subjective:  Patient ID: Kevin English, male    DOB: [birth year]  Age: 62 y.o. MRN: [id number]  CC: Follow-up   HPI Kevin English presents for follow-up of his depression and urinary incontinence.  Kevin English is helping but his mood does tend to vary some throughout the day.  He continues to have some daytime sleepiness.  His current CPAP mask is leaking and he is having a hard time getting it to fit properly.  He denies pre-or post void dribble, decreased force of stream, incomplete emptying.  He does have nocturia x3 nightly but admits to late night fluid ingestion.  He does experience some urinary incontinence.  His prostate is being regularly checked by urology.   Outpatient Medications Prior to Visit  Medication Sig Dispense Refill  . acetaminophen (TYLENOL) 325 MG tablet Take 325 mg by mouth every 6 (six) hours as needed (pain).     Marland Kitchen amLODipine (NORVASC) 10 MG tablet TAKE 1 TABLET (10 MG TOTAL) BY MOUTH DAILY. 90 tablet 2  . aspirin EC 81 MG tablet Take 1 tablet (81 mg total) by mouth daily.    . Blood Glucose Monitoring Suppl (ONE TOUCH ULTRA MINI) w/Device KIT Use meter to check blood sugars 1-4 times daily as instructed. 1 each 0  . ezetimibe (ZETIA) 10 MG tablet Take 1 tablet (10 mg total) by mouth daily. 30 tablet 11  . glucose blood (ONE TOUCH ULTRA TEST) test strip Use one strip per test. Test blood sugars 1-4 times daily as instructed. 100 each 12  . Lancets Misc. (ONE TOUCH SURESOFT) MISC Use 1 lancet per test. Test blood sugars 1-4 times per day as instructed. 1 each 1  . levothyroxine (SYNTHROID, LEVOTHROID) 75 MCG tablet TAKE 1 TABLET (75 MCG TOTAL) BY MOUTH DAILY BEFORE BREAKFAST. 90 tablet 2  . loratadine (CLARITIN) 10 MG tablet Take 1 tablet (10 mg total) by mouth daily.    Marland Kitchen MELATONIN GUMMIES PO Take by mouth. Pt states taking 2 tablets 20 mg at night but making him more sleepy so taking 1 at night    . metFORMIN (GLUCOPHAGE) 500 MG tablet Take 1 tablet (500 mg total) by  mouth 2 (two) times daily with a meal. 60 tablet 3  . metoprolol tartrate (LOPRESSOR) 25 MG tablet Take 25 mg by mouth 2 (two) times daily.    Marland Kitchen omega-3 acid ethyl esters (LOVAZA) 1 g capsule TAKE 2 CAPSULES (2 G TOTAL) BY MOUTH 2 (TWO) TIMES DAILY. 360 capsule 1  . Vitamin D, Ergocalciferol, (DRISDOL) 50000 units CAPS capsule Take 1 capsule (50,000 Units total) by mouth every 7 (seven) days. 30 capsule 1  . warfarin (COUMADIN) 5 MG tablet TAKE AS DIRECTED BY ANTICOAGULATION CLINIC 40 tablet 3  . PARoxetine (Kevin English) 20 MG tablet Take 1 tablet (20 mg total) by mouth daily. 30 tablet 2  . metoprolol tartrate (LOPRESSOR) 50 MG tablet Take 1 tablet (50 mg total) by mouth 2 (two) times daily. (Patient taking differently: Take 25 mg by mouth 2 (two) times daily. ) 180 tablet 2   No facility-administered medications prior to visit.     ROS Review of Systems  Constitutional: Negative.   HENT: Negative.   Eyes: Negative.   Respiratory: Negative.   Cardiovascular: Negative.   Gastrointestinal: Negative.   Genitourinary: Negative for decreased urine volume, difficulty urinating, discharge, enuresis, frequency, hematuria and urgency.  Musculoskeletal: Negative for gait problem and myalgias.  Skin: Negative for color change and rash.  Allergic/Immunologic: Negative for  immunocompromised state.  Neurological: Negative for weakness and headaches.  Hematological: Does not bruise/bleed easily.  Psychiatric/Behavioral: Positive for dysphoric mood. The patient is nervous/anxious.     Objective:  BP 122/80 (BP Location: Left Arm, Patient Position: Sitting, Cuff Size: Normal)   Pulse 86   Ht 5' 8"  (1.727 m)   Wt 197 lb 2 oz (89.4 kg)   SpO2 98%   BMI 29.97 kg/m   BP Readings from Last 3 Encounters:  07/01/17 122/80  05/13/17 122/76  05/04/17 134/82    Wt Readings from Last 3 Encounters:  07/01/17 197 lb 2 oz (89.4 kg)  05/13/17 195 lb (88.5 kg)  05/04/17 193 lb 9.6 oz (87.8 kg)    Physical  Exam  Lab Results  Component Value Date   WBC 16.0 (H) 03/03/2016   HGB 18.0 (H) 03/03/2016   HCT 52.8 (H) 03/03/2016   PLT 224 03/03/2016   GLUCOSE 73 04/01/2017   CHOL 160 04/22/2017   TRIG 307 (H) 04/22/2017   HDL 22 (L) 04/22/2017   LDLDIRECT 62.0 12/13/2014   LDLCALC 77 04/22/2017   ALT 30 04/01/2017   AST 21 04/01/2017   NA 139 04/01/2017   K 4.5 04/01/2017   CL 106 04/01/2017   CREATININE 1.58 (H) 04/01/2017   BUN 20 04/01/2017   CO2 25 04/01/2017   TSH 0.74 04/01/2017   INR 2.8 06/10/2017   HGBA1C 6.0 04/01/2017   MICROALBUR 6.5 (H) 04/01/2017    No results found.  Assessment & Plan:   Kevin English was seen today for follow-up.  Diagnoses and all orders for this visit:  OSA (obstructive sleep apnea)  Hypothyroidism (acquired) -     TSH  Vitamin D deficiency -     VITAMIN D 25 Hydroxy (Vit-D Deficiency, Fractures)  Continuous leakage of urine -     PSA -     Urinalysis, Routine w reflex microscopic  Current moderate episode of major depressive disorder, unspecified whether recurrent (HCC) -     PARoxetine (Kevin English) 30 MG tablet; Take 1 tablet (30 mg total) by mouth daily.  Anxiety state -     PARoxetine (Kevin English) 30 MG tablet; Take 1 tablet (30 mg total) by mouth daily.   I have discontinued Kevin English. Kevin English's PARoxetine. I am also having him start on PARoxetine. Additionally, I am having him maintain his acetaminophen, aspirin EC, loratadine, glucose blood, ONE TOUCH ULTRA MINI, ONE TOUCH SURESOFT, levothyroxine, Vitamin D (Ergocalciferol), metFORMIN, amLODipine, ezetimibe, MELATONIN GUMMIES PO, warfarin, omega-3 acid ethyl esters, and metoprolol tartrate.  Meds ordered this encounter  Medications  . PARoxetine (Kevin English) 30 MG tablet    Sig: Take 1 tablet (30 mg total) by mouth daily.    Dispense:  30 tablet    Refill:  2   Patient information was given concerning: vit d def, sleep apnea, depression and anxiety.  With the increased PHQ 9 score I am increasing  his Kevin English to 30 mg daily.  I asked the patient and his wife to make finding a CPAP mask to fit him a priority.  He will never sleep as well as he could with a leaking mask.  Asked him to avoid his daytime naps.  Asked him to become more active physically by helping his wife out in the yard.  Asked him to consider making an appointment with his urologist.  I think his nocturia could be due to nighttime fluid ingestion and he will go back to limiting any fluid intake past 6:00 at  night.  Follow-up: Return in about 2 months (around 08/31/2017).  Libby Maw, MD

## 2017-07-01 NOTE — Patient Instructions (Signed)
Sleep Apnea Sleep apnea is a condition in which breathing pauses or becomes shallow during sleep. Episodes of sleep apnea usually last 10 seconds or longer, and they may occur as many as 20 times an hour. Sleep apnea disrupts your sleep and keeps your body from getting the rest that it needs. This condition can increase your risk of certain health problems, including:  Heart attack.  Stroke.  Obesity.  Diabetes.  Heart failure.  Irregular heartbeat.  There are three kinds of sleep apnea:  Obstructive sleep apnea. This kind is caused by a blocked or collapsed airway.  Central sleep apnea. This kind happens when the part of the brain that controls breathing does not send the correct signals to the muscles that control breathing.  Mixed sleep apnea. This is a combination of obstructive and central sleep apnea.  What are the causes? The most common cause of this condition is a collapsed or blocked airway. An airway can collapse or become blocked if:  Your throat muscles are abnormally relaxed.  Your tongue and tonsils are larger than normal.  You are overweight.  Your airway is smaller than normal.  What increases the risk? This condition is more likely to develop in people who:  Are overweight.  Smoke.  Have a smaller than normal airway.  Are elderly.  Are male.  Drink alcohol.  Take sedatives or tranquilizers.  Have a family history of sleep apnea.  What are the signs or symptoms? Symptoms of this condition include:  Trouble staying asleep.  Daytime sleepiness and tiredness.  Irritability.  Loud snoring.  Morning headaches.  Trouble concentrating.  Forgetfulness.  Decreased interest in sex.  Unexplained sleepiness.  Mood swings.  Personality changes.  Feelings of depression.  Waking up often during the night to urinate.  Dry mouth.  Sore throat.  How is this diagnosed? This condition may be diagnosed with:  A medical history.  A  physical exam.  A series of tests that are done while you are sleeping (sleep study). These tests are usually done in a sleep lab, but they may also be done at home.  How is this treated? Treatment for this condition aims to restore normal breathing and to ease symptoms during sleep. It may involve managing health issues that can affect breathing, such as high blood pressure or obesity. Treatment may include:  Sleeping on your side.  Using a decongestant if you have nasal congestion.  Avoiding the use of depressants, including alcohol, sedatives, and narcotics.  Losing weight if you are overweight.  Making changes to your diet.  Quitting smoking.  Using a device to open your airway while you sleep, such as: ? An oral appliance. This is a custom-made mouthpiece that shifts your lower jaw forward. ? A continuous positive airway pressure (CPAP) device. This device delivers oxygen to your airway through a mask. ? A nasal expiratory positive airway pressure (EPAP) device. This device has valves that you put into each nostril. ? A bi-level positive airway pressure (BPAP) device. This device delivers oxygen to your airway through a mask.  Surgery if other treatments do not work. During surgery, excess tissue is removed to create a wider airway.  It is important to get treatment for sleep apnea. Without treatment, this condition can lead to:  High blood pressure.  Coronary artery disease.  (Men) An inability to achieve or maintain an erection (impotence).  Reduced thinking abilities.  Follow these instructions at home:  Make any lifestyle changes that your health  care provider recommends.  Eat a healthy, well-balanced diet.  Take over-the-counter and prescription medicines only as told by your health care provider.  Avoid using depressants, including alcohol, sedatives, and narcotics.  Take steps to lose weight if you are overweight.  If you were given a device to open your  airway while you sleep, use it only as told by your health care provider.  Do not use any tobacco products, such as cigarettes, chewing tobacco, and e-cigarettes. If you need help quitting, ask your health care provider.  Keep all follow-up visits as told by your health care provider. This is important. Contact a health care provider if:  The device that you received to open your airway during sleep is uncomfortable or does not seem to be working.  Your symptoms do not improve.  Your symptoms get worse. Get help right away if:  You develop chest pain.  You develop shortness of breath.  You develop discomfort in your back, arms, or stomach.  You have trouble speaking.  You have weakness on one side of your body.  You have drooping in your face. These symptoms may represent a serious problem that is an emergency. Do not wait to see if the symptoms will go away. Get medical help right away. Call your local emergency services (911 in the [place] Do not drive yourself to the hospital. This information is not intended to replace advice given to you by your health care provider. Make sure you discuss any questions you have with your health care provider. Document Released: 02/27/2002 Document Revised: 11/03/2015 Document Reviewed: 12/17/2014 Elsevier Interactive Patient Education  2018 [place] With Anxiety Anxiety disorders are mental health conditions that cause overwhelming feelings of nervousness or worry. These feelings interfere with daily activities and relationships. Anxiety disorders include:  Generalized anxiety disorder (GAD).  Social anxiety.  Post-traumatic stress disorder (PTSD).  When a person has an anxiety disorder, his or her condition can affect others around him or her, such as friends and family members. Friends and family can help by offering support and understanding. What do I need to know about this condition? Anxiety is the mental  and physical experience of nervousness or worry that you might feel when you think about a stressful event. Occasional anxiety is normal, but a person with an anxiety disorder becomes preoccupied with this worry. He or she may know that the anxiety is not logical, but knowing this does not relieve the discomfort that he or she feels. Anxiety disorders cause a great deal of distress and prevent someone from having a normal daily life. Someone with an anxiety disorder may:  Experience anxiety that: ? May or may not not have a specific trigger. ? Lasts for long periods of time. ? Causes physical problems over time. ? Is far more intense than normal anticipation. ? Occurs at unpredictable times.  Feel restless or edgy.  Get fatigued easily.  Have trouble focusing.  Have muscle tension.  Have trouble falling asleep or staying asleep.  Be irritable and occasionally have sudden expressions of strong feelings (outbursts).  Have worries that do not make sense to you.  What do I need to know about the treatment options? Anxiety disorders are generally very treatable by mental health providers such as psychologists, psychiatrists, and clinical social workers. Treatment may include one or more of the following:  Psychotherapy, also called talk therapy or counseling. Types of psychotherapy that are used to treat anxiety include: ? Cognitive behavioral  therapy (CBT). This type of therapy teaches a person how to recognize unhealthy feelings, thoughts, and behaviors, and how to replace those feelings with positive thoughts and actions. ? Behavior therapy that trains a person to relax and self-soothe. This also involves gradually exposing the person to the cause of the anxiety (progressive exposure therapy). ? Biofeedback. This type of therapy focuses on trying to control certain body functions, like heart rate, to lessen the physical impact of anxiety. ? Mindfulness-based stress reduction training.  This uses education, meditation, and yoga to help a person stay focused on the present instead of living in the past or worrying about the future. ? Acceptance and commitment therapy (ACT). This helps a person to focus on acceptance, rather than trying to control every situation. ? Family therapy. This treatment helps family members to communicate and deal with conflict in healthy ways.  Medicine to treat anxiety and help to control certain emotions and behaviors.  Mind-body programs. These programs encourage the person with anxiety to be involved in his or her treatment and feel empowered. Mind-body programs may include mindfulness-based stress reduction training, yoga, or tai chi.  How can I support my loved one? Talk about the condition Good communication is the key to supporting your friend or family member. Here are a few things to keep in mind:  Be careful about too much prodding. Try not to overdo reminders to an adult friend or family member about things like taking medicines. Ask how your loved one prefers that you help.  Ask questions and then listen to your loved one's response. Be available if your friend or family member wants to talk, but give your loved one space if he or she does not feel like talking.  Never ignore comments about suicide, and do not try to avoid the subject of suicide. Talking about suicide will not make your loved one want to act on it. You or your loved one can reach out 24 hours a day to get free, private support (on the phone or a live online chat) from a suicide crisis helpline, such as the [place] at 5710344588.  Be encouraging and offer emotional support. This can help to lower stress. Even saying something simple to comfort your loved one may help.  If your loved one is open to it, go with him or her to visits with a counselor or health care provider. Get suggestions directly from your loved one's care providers about when  to get help if you are concerned about behavior changes. Privacy laws limit how much a person's health care provider can share with you without your loved one's permission, but if you feel that a situation is an emergency, do not wait to call a health care provider or emergency services.  Find support and resources A health care provider may be able to recommend mental health resources that are available online or over the phone. You could start with:  Government sites such as the Substance Abuse and [address] (SAMHSA): ktimeonline.com  National mental health organizations such as the [employer] on [place] ([place]): www.nami.org  You may also consider:  Joining self-help and support groups, not only for your friend or family member, but also for yourself. People in these peer and family support groups understand what you and your loved one are going through. They can help you feel a sense of hope and connect you with local resources to help you learn more.  Family therapy.  General support  Make an effort to learn all you can about your loved one's form of anxiety.  Include your loved one in activities. Invite him or her to go for walks and outings.  Help your loved one follow his or her treatment plan as directed by health care providers. This could mean driving him or her to therapy sessions or suggesting ways to cope with stress.  Remember that your support really matters. Social support is a huge benefit for someone who is coping with anxiety. How can I create a safe environment?  For certain types of anxiety, such as PTSD, you may want to: ? Remove alcohol and prescription medicines from your loved one's home, or limit the amount of these substances in the home. This can help to prevent your loved one from abusing alcohol and prescription medicines. ? Remove or lock up guns and other weapons. If you do not have a safe place to keep a gun, local  law enforcement may store a gun for you.  Make a written crisis plan. Include important phone numbers, such as the local crisis intervention team. Make sure that: ? The person with anxiety knows about this plan and agrees with it. ? Everyone who has regular contact with the person knows about the plan and knows what to do in an emergency. ? The written plan is easily accessible and can be quickly put into action. How should I care for myself? It is important to find ways to care for your body, mind, and well-being while supporting someone with anxiety.  Try to maintain your normal routines. This can help you remember that your life is about more than your loved one's condition.  Understand what your limits are. Say "no" to requests or events that lead to a schedule that is too busy.  Make time for activities that help you relax, and try to not feel guilty about taking time for yourself.  Spend time with friends and family.  Consider trying meditation and deep breathing exercises to lower your stress. Attend some mind-body classes by yourself or with your loved one.  Get plenty of sleep.  Exercise, even if it is just taking a short walk a few times a week.  If you are struggling emotionally with guilt, fear, or anger, consider working with a therapist.  What are some signs that the condition is getting worse? Signs that your loved one's condition may be getting worse include:  Dramatic mood swings.  Staying away from activities that he or she used to enjoy.  Drinking more alcohol than normal.  Either seeming tearful or seeming to lack emotion.  Talking about "not feeling right."  Staying away from others (isolating himself or herself).  Get help right away if:  Your loved one expresses thoughts about harming himself or herself or others.  Your loved one's behavior becomes hard to predict (erratic).  Your loved one shows behavior that does not make sense with the current  time, place, or circumstances. These behaviors may include seeing, feeling, tasting, or hearing things that are not real (hallucinations) or having flashbacks. If you ever feel like your loved one may hurt himself or herself or others, or may have thoughts about taking his or her own life, get help right away. You can go to your nearest emergency department or call:  Your local emergency services (911 in the [place]  A suicide crisis helpline, such as the [place] at 718-361-2287. This is open 24  hours a day.  Summary  People with anxiety disorders experience overwhelming feelings of nervousness or worry. Friends and family can help by offering support and understanding.  Anxiety disorders are generally very treatable by mental health providers. They can be treated with psychotherapy (also known as talk therapy), behavior therapy, medicine, and mind-body programs.  Be compassionate and listen to your loved one. Be available if your friend or family member wants to talk, but give your loved one space if he or she does not feel like talking.  Find ways to care for your own body, mind, and well-being while supporting someone with anxiety. Try to maintain your normal routines and make time to do things that you enjoy. This information is not intended to replace advice given to you by your health care provider. Make sure you discuss any questions you have with your health care provider. Document Released: 07/21/2016 Document Revised: 07/21/2016 Document Reviewed: 07/21/2016 Elsevier Interactive Patient Education  2018 [place] With Depression Depression is a mental health condition that affects the way a person feels, thinks, and handles daily activities such as eating, sleeping, and working. When a person has depression, his or her condition can affect others around him or her, such as friends and family members. Friends and family can help by  offering support and understanding. What do I need to know about this condition? The main symptoms of depression are:  Constant depressed or irritable mood.  Loss of interest in things and activities that were enjoyed in the past.  Other symptoms of depression include:  Fatigue.  Sleeping too much or too little.  Difficulty falling asleep, or waking up early and not being able to get back to sleep.  Difficulty concentrating or making decisions.  Changes in appetite and weight.  Staying away from others (isolating oneself).  Expressing feelings of guilt.  Expressing suicidal thoughts or feelings.  What do I need to know about the treatment options? This condition is usually treated by mental health providers such as psychologists, psychiatrists, and clinical social workers. Treatment may include one or more of the following:  Psychotherapy, also called talk therapy or counseling. Types of psychotherapy include individual or group therapy, and they usually involve the following approaches: ? Cognitive behavioral therapy (CBT). This type of therapy teaches a person how to recognize feelings, thoughts, and behaviors that contribute to depression. The person is taught to make a choice about how to respond to these feelings, thoughts, and behaviors so that he or she can experience fewer symptoms. ? Interpersonal therapy (IPT). This helps to improve the way that someone with depression relates to and communicates with others. This type of therapy may involve caregivers and loved ones. ? Family therapy. This treatment helps family members to communicate and deal with conflict in healthy ways.  Medicine. This is often used to help with certain emotions and behaviors.  Combining medicine and therapy is often the most effective approach. The following lifestyle changes may also help with managing symptoms of depression:  Limiting alcohol and drug use.  Exercising regularly.  Getting  enough good-quality sleep.  Making healthy eating choices.  Reducing distressing situations.  Spending time outside.  Following regular daily routines.  How can I support my loved one? Talk about the condition Good communication is the key to supporting your friend or family member. Here are a few things to keep in mind:  Ask your loved one how you can support him or her.  Respect  your loved one's right to make decisions.  Be careful about too much prodding. Try not to overdo reminders to an adult friend or family member about things like taking medicines. Ask how your loved one prefers that you help.  Be encouraging and offer emotional support. This can help to lower stress. Even saying something simple to comfort your loved one may help.  Never ignore comments about suicide, and do not try to avoid the subject of suicide. Talking about suicide will not make your loved one want to act on it. You or your loved one can reach out 24 hours a day to get free, private support (on the phone or a live online chat) from a suicide crisis helpline, such as the [place] at 818-615-2317.  Listening is very important. Be available if your friend or family member wants to talk. Make an effort to acknowledge his or her feelings and stay calm and realistic.  Find support and resources A health care provider may be able to recommend mental health resources that are available online or over the phone. You could start with:  Government sites such as the Substance Abuse and [place] (SAMHSA): ktimeonline.com  National mental health organizations such as the [employer] on [place] ([place]): www.nami.org  You may also consider:  Joining self-help and support groups, not only for your friend or family member, but also for yourself. People in these peer and family support groups understand what you and your loved one are going through.  They can help you feel a sense of hope and connect you with local resources to help you learn more.  Attending family therapy with your loved one.  General support  Make an effort to learn all you can about depression.  Help your loved one follow his or her treatment plan as directed by health care providers. This could mean driving him or her to therapy sessions or suggesting ways to cope with stress.  Ask your loved one if you may join him or her for a therapy session or go with him or her to health care visits. Joining your loved one with his or her permission can give you an opportunity to learn how to be more supportive.  Include your loved one in activities. Invite her or him to go for walks and outings. At first, your loved one may not want to, but keep trying.  Be patient and do not expect your loved one to do too much too soon.  Help with daily responsibilities, such as laundry or meals. Sometimes daily tasks seem overwhelming to a person with depression.  Remember that your support really matters. Social support is a huge benefit for someone who is coping with depression. How can I create a safe environment? If your loved one feels unable to control his or her behavior, it may be necessary to take steps to keep his or her home safe. Such steps may include:  Locking up alcohol and prescription pills that your loved one may turn to. Count prescription pills often. You may want to consider removing alcohol from the home.  Removing or locking up guns and other weapons. If you do not have a safe place to keep a gun, local law enforcement may store a gun for you.  Making a written crisis plan. Include important phone numbers, such as the local crisis intervention team. Make sure that: ? The person with depression knows about this plan. ? Everyone who has  regular contact with that person knows about the plan and knows what to do in an emergency.  How should I care for  myself? \[id number] D Test Why am I having this test? Vitamin D is a vitamin that is created in your body with sunlight exposure. It is also found naturally in fish and eggs. It can also be found:  As a dietary supplement in: ? Milk. ? Breakfast cereals. ? Soy milk.  As a supplemental pill.  In some multivitamins.  You need vitamin D to maintain bone health and to support your immune system. Vitamin D levels are often monitored if you have osteoporosis or other bone disorders. Your health care provider will determine if you need this test based on your medical history. What kind of sample is taken? A blood sample is required for this test. It is usually collected by inserting a needle into a vein or by sticking a finger with a small needle. How do I prepare for this test? There is no preparation required for this test. What are the reference ranges? Reference rangesare considered healthy rangesestablished after testing a large group of healthy people. Reference rangesmay vary among different people, labs, and hospitals. It is your responsibility to obtain your test results. Ask the lab or department performing the test when and how you will get your results. What do the results mean? Reference range for 1,25-dihydroxyvitamin D:  Males: 18-64 pg/mL is considered normal.  Females: 18-78 pg/mL is considered normal.  Reference range for total 25-hydroxy D:  30-80 ng/mL is considered optimal.  20-30 ng/mL indicates vitamin D insufficiency.  Less than 20 ng/mL indicates vitamin D deficiency.  Decreased levels of vitamin D can be seen with:  Inadequate exposure to sunlight.  Inadequate dietary intake.  Osteoporosis.  Kidney disease.  Liver disease.  Rickets.  Osteomalacia.  Gastrointestinal malabsorption.  Increased levels of vitamin D can be seen with:  Excess dietary  supplements.  Hyperparathyroidism.  Sarcoidosis.  Williams syndrome.  Talk with your health care provider to discuss your results, treatment options, and if necessary, the need for more tests. Talk with your health care provider if you have any questions about your results. Talk with your health care provider to discuss your results, treatment options, and if necessary, the need for more tests. Talk with your health care provider if you have any questions about your results. This information is not intended to replace advice given to you by your health care provider. Make sure you discuss any questions you have with your health care provider. Document Released: 04/03/2004 Document Revised: 11/13/2015 Document Reviewed: 08/02/2013 Elsevier Interactive Patient Education  2[employer]

## 2017-07-12 DIAGNOSIS — N183 Chronic kidney disease, stage 3 (moderate): Secondary | ICD-10-CM | POA: Diagnosis not present

## 2017-07-14 ENCOUNTER — Ambulatory Visit (INDEPENDENT_AMBULATORY_CARE_PROVIDER_SITE_OTHER): Payer: PPO | Admitting: *Deleted

## 2017-07-14 ENCOUNTER — Telehealth: Payer: Self-pay | Admitting: Cardiology

## 2017-07-14 DIAGNOSIS — I442 Atrioventricular block, complete: Secondary | ICD-10-CM

## 2017-07-14 NOTE — Progress Notes (Signed)
Remote pacemaker transmission.   

## 2017-07-14 NOTE — Telephone Encounter (Signed)
Confirmed remote transmission w/ pt wife.   

## 2017-07-15 LAB — CUP PACEART REMOTE DEVICE CHECK
Brady Statistic AP VS Percent: 67 %
Brady Statistic AS VS Percent: 33 %
Date Time Interrogation Session: 20190424175442
Implantable Lead Implant Date: 20160301
Implantable Lead Implant Date: 20160301
Implantable Lead Location: 753859
Implantable Lead Location: 753860
Lead Channel Impedance Value: 690 Ohm
Lead Channel Pacing Threshold Amplitude: 0.625 V
Lead Channel Pacing Threshold Pulse Width: 0.4 ms
Lead Channel Pacing Threshold Pulse Width: 0.4 ms
Lead Channel Setting Sensing Sensitivity: 5.6 mV
MDC IDC MSMT BATTERY IMPEDANCE: 159 Ohm
MDC IDC MSMT BATTERY REMAINING LONGEVITY: 132 mo
MDC IDC MSMT BATTERY VOLTAGE: 2.78 V
MDC IDC MSMT LEADCHNL RA IMPEDANCE VALUE: 449 Ohm
MDC IDC MSMT LEADCHNL RA PACING THRESHOLD AMPLITUDE: 0.625 V
MDC IDC PG IMPLANT DT: 20160301
MDC IDC SET LEADCHNL RA PACING AMPLITUDE: 2 V
MDC IDC SET LEADCHNL RV PACING AMPLITUDE: 2.5 V
MDC IDC SET LEADCHNL RV PACING PULSEWIDTH: 0.4 ms
MDC IDC STAT BRADY AP VP PERCENT: 0 %
MDC IDC STAT BRADY AS VP PERCENT: 0 %

## 2017-07-16 ENCOUNTER — Encounter: Payer: Self-pay | Admitting: Cardiology

## 2017-07-19 IMAGING — CT CT BIOPSY
2 series · 13 of 32 positions shown, 18 images · non-contrast
Comparison: none

INDICATION: POLYCYTHEMIA, LEUKOCYTOSIS, CONCERN FOR MYELODYSPLASTIC SYNDROME

[Series 2: i-spiral 5.0 b70f · axial · 0.83mm/px · z∈[-118,-34]mm · 8 of 30 slices shown]
[im 3/30  soft-tissue]
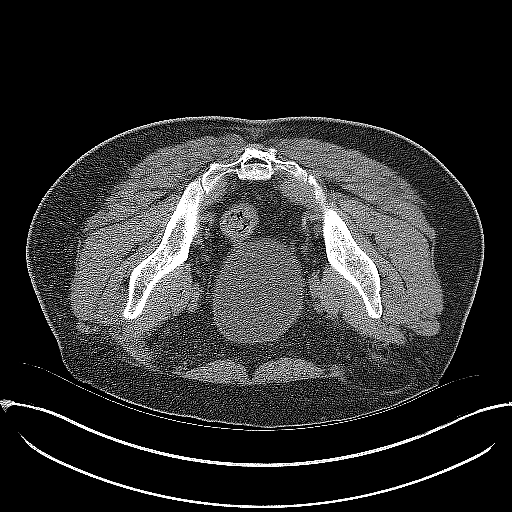
[im 7/30  soft-tissue]
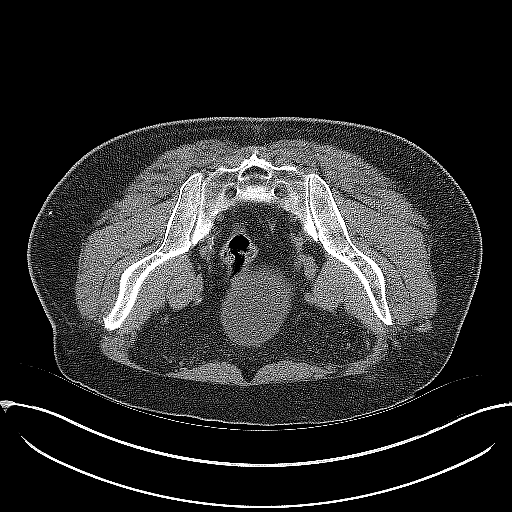
[im 10/30  soft-tissue]
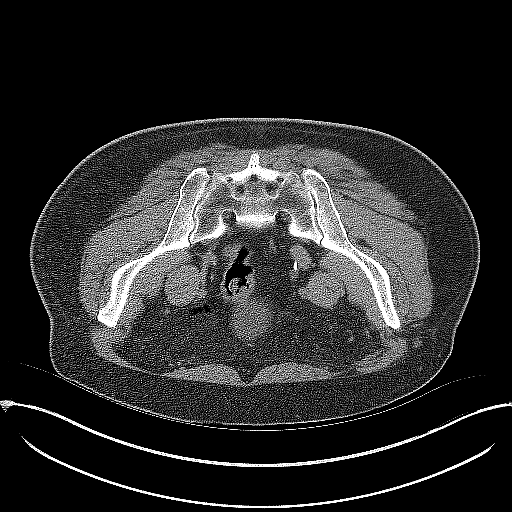
[im 14/30  soft-tissue]
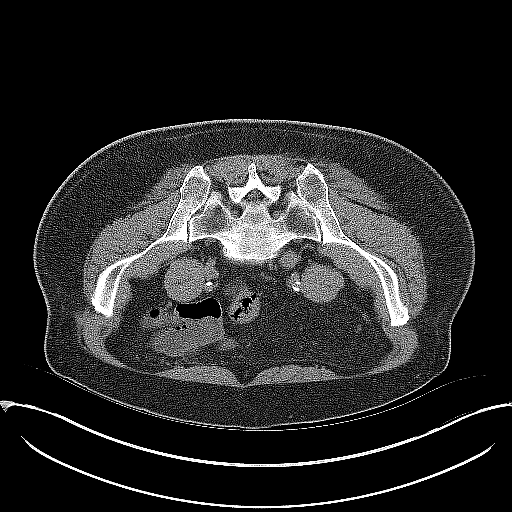
[im 16/30  soft-tissue]
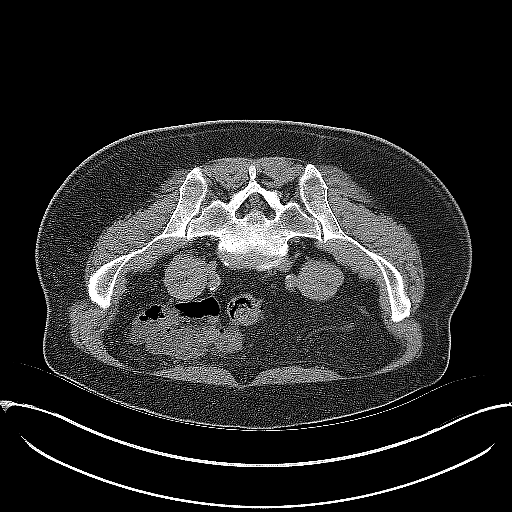
[im 20/30  soft-tissue]
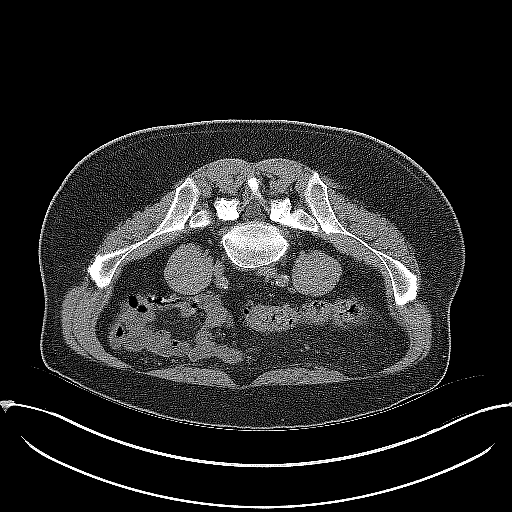
[im 23/30  soft-tissue]
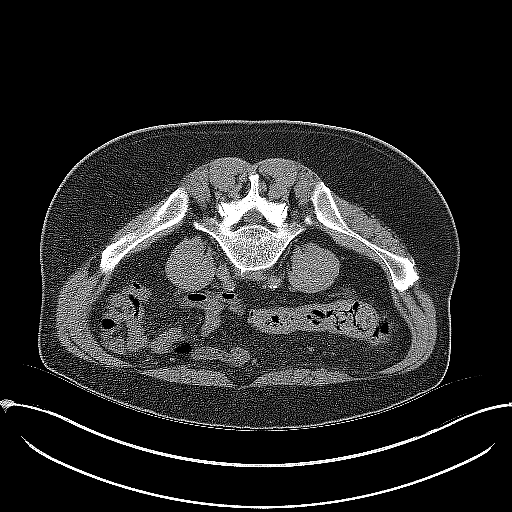
[im 27/30  soft-tissue]
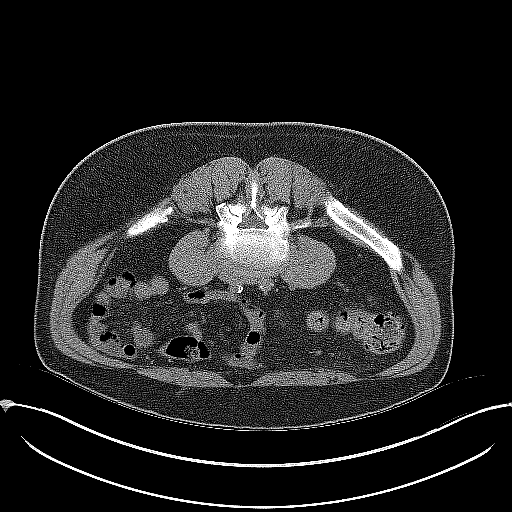

[Series 3: i-sequence 4.8 b70s · axial · 0.83mm/px · z∈[-81,-72]mm · 5 of 9 slices shown, 10 images]
[im 2/9  soft-tissue]
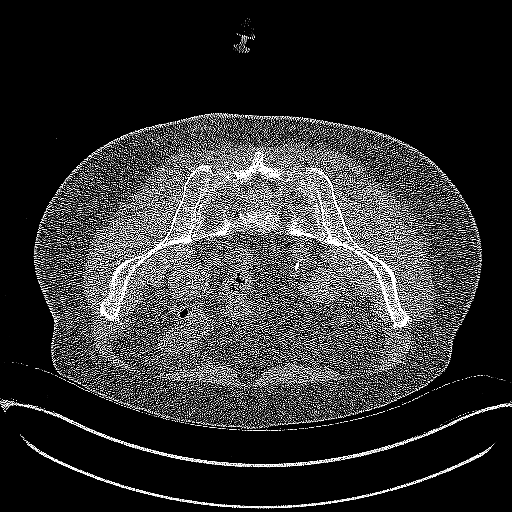
[im 2/9  bone]
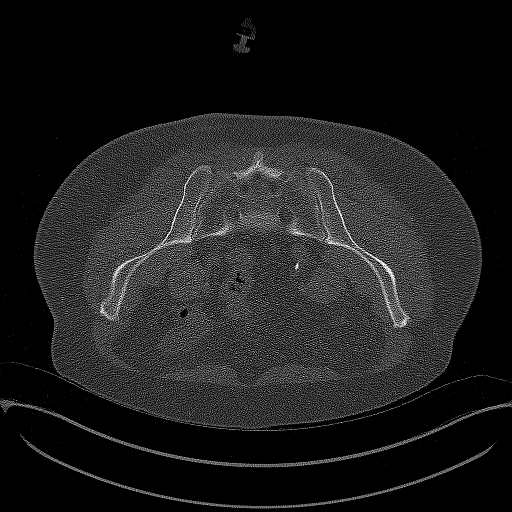
[im 3/9  soft-tissue]
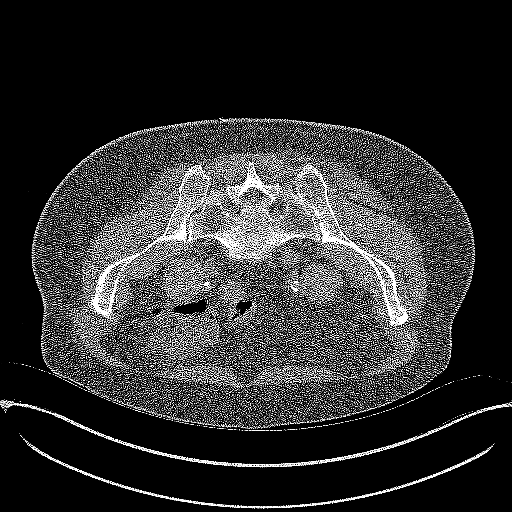
[im 3/9  lung]
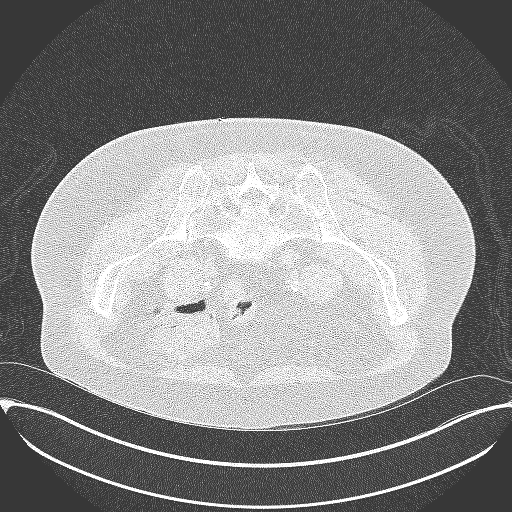
[im 5/9  soft-tissue]
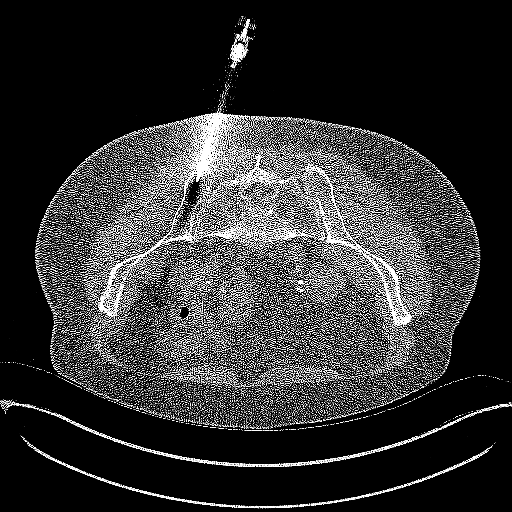
[im 5/9  lung]
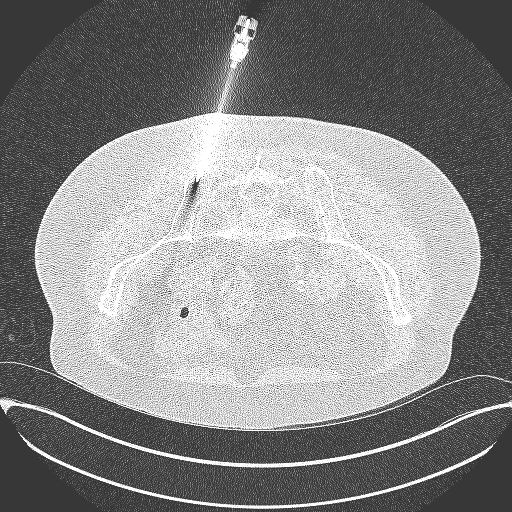
[im 6/9  soft-tissue]
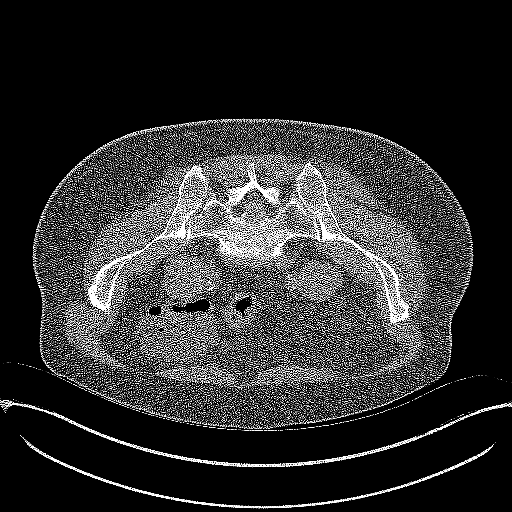
[im 6/9  lung]
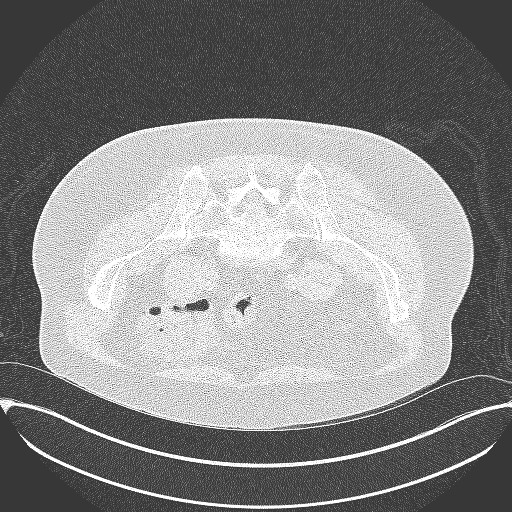
[im 7/9  soft-tissue]
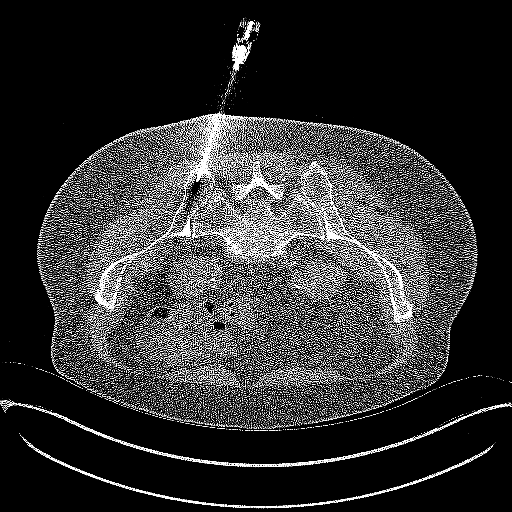
[im 7/9  lung]
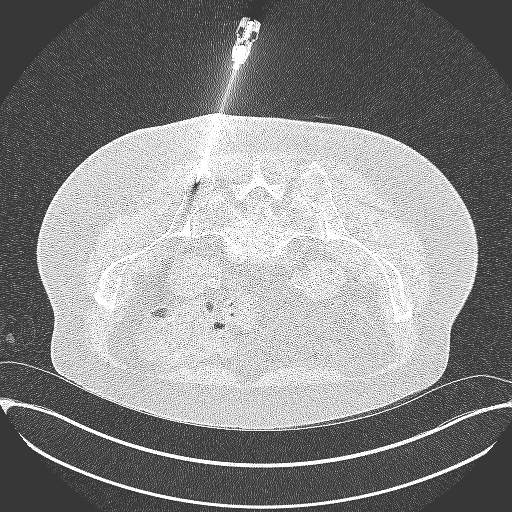

[13 of 32 positions shown; findings below may reference images not displayed]

EXAM:
CT GUIDED RIGHT ILIAC BONE MARROW ASPIRATION AND CORE BIOPSY

Radiologist:  Interiores, [place]

Guidance:  CT

FLUOROSCOPY TIME:  Fluoroscopy Time: NONE.

MEDICATIONS:
None.

ANESTHESIA/SEDATION:
3.0 mg IV Versed; 150 mcg IV Fentanyl

Moderate Sedation Time:  11 minutes

The patient was continuously monitored during the procedure by the
interventional radiology nurse under my direct supervision.

CONTRAST:  None.

COMPLICATIONS:
None

PROCEDURE:
Informed consent was obtained from the patient following explanation
of the procedure, risks, benefits and alternatives. The patient
understands, agrees and consents for the procedure. All questions
were addressed. A time out was performed.

The patient was positioned prone and non-contrast localization CT
was performed of the pelvis to demonstrate the iliac marrow spaces.

Maximal barrier sterile technique utilized including caps, mask,
sterile gowns, sterile gloves, large sterile drape, hand hygiene,
and Betadine prep.

Under sterile conditions and local anesthesia, an 11 gauge coaxial
bone biopsy needle was advanced into the right iliac marrow space.
Needle position was confirmed with CT imaging. Initially, bone
marrow aspiration was performed. Next, the 11 gauge outer cannula
was utilized to obtain a right iliac bone marrow core biopsy. Needle
was removed. Hemostasis was obtained with compression. The patient
tolerated the procedure well. Samples were prepared with the
cytotechnologist. No immediate complications.
IMPRESSION: CT guided right iliac bone marrow aspiration and core biopsy.

## 2017-07-21 ENCOUNTER — Ambulatory Visit (INDEPENDENT_AMBULATORY_CARE_PROVIDER_SITE_OTHER): Payer: PPO | Admitting: *Deleted

## 2017-07-21 DIAGNOSIS — Z954 Presence of other heart-valve replacement: Secondary | ICD-10-CM

## 2017-07-21 DIAGNOSIS — I050 Rheumatic mitral stenosis: Secondary | ICD-10-CM

## 2017-07-21 DIAGNOSIS — I5033 Acute on chronic diastolic (congestive) heart failure: Secondary | ICD-10-CM | POA: Diagnosis not present

## 2017-07-21 DIAGNOSIS — I4891 Unspecified atrial fibrillation: Secondary | ICD-10-CM | POA: Diagnosis not present

## 2017-07-21 DIAGNOSIS — Z0000 Encounter for general adult medical examination without abnormal findings: Secondary | ICD-10-CM

## 2017-07-21 DIAGNOSIS — Z5181 Encounter for therapeutic drug level monitoring: Secondary | ICD-10-CM | POA: Diagnosis not present

## 2017-07-21 LAB — POCT INR: INR: 2.8

## 2017-07-21 NOTE — Patient Instructions (Signed)
Description   Continue on same dosage 1 tablet daily except take 1.5  tablets Tuesdays, Thursdays, and Saturdays. Recheck in 6 weeks.  Call [phone] with any questions.     

## 2017-07-23 NOTE — Telephone Encounter (Signed)
Followed up with pt who states he still has not started Zetia and does not plan to because he has "a lot going on." Discussed the benefit of lowering his cholesterol, especially since he had initially expressed concern over lowering his numbers. Again encouraged him to try Zetia 10mg  daily. Will see if he has started therapy next month at his Coumadin check.

## 2017-07-28 ENCOUNTER — Other Ambulatory Visit: Payer: Self-pay | Admitting: Family Medicine

## 2017-08-24 DIAGNOSIS — E1122 Type 2 diabetes mellitus with diabetic chronic kidney disease: Secondary | ICD-10-CM | POA: Diagnosis not present

## 2017-08-24 DIAGNOSIS — I129 Hypertensive chronic kidney disease with stage 1 through stage 4 chronic kidney disease, or unspecified chronic kidney disease: Secondary | ICD-10-CM | POA: Diagnosis not present

## 2017-08-24 DIAGNOSIS — Z8679 Personal history of other diseases of the circulatory system: Secondary | ICD-10-CM | POA: Diagnosis not present

## 2017-08-24 DIAGNOSIS — F17201 Nicotine dependence, unspecified, in remission: Secondary | ICD-10-CM | POA: Diagnosis not present

## 2017-08-24 DIAGNOSIS — Z952 Presence of prosthetic heart valve: Secondary | ICD-10-CM | POA: Diagnosis not present

## 2017-08-24 DIAGNOSIS — I509 Heart failure, unspecified: Secondary | ICD-10-CM | POA: Diagnosis not present

## 2017-08-24 DIAGNOSIS — C649 Malignant neoplasm of unspecified kidney, except renal pelvis: Secondary | ICD-10-CM | POA: Diagnosis not present

## 2017-08-24 DIAGNOSIS — F329 Major depressive disorder, single episode, unspecified: Secondary | ICD-10-CM | POA: Diagnosis not present

## 2017-08-24 DIAGNOSIS — N179 Acute kidney failure, unspecified: Secondary | ICD-10-CM | POA: Diagnosis not present

## 2017-08-24 DIAGNOSIS — I4891 Unspecified atrial fibrillation: Secondary | ICD-10-CM | POA: Diagnosis not present

## 2017-08-24 DIAGNOSIS — D751 Secondary polycythemia: Secondary | ICD-10-CM | POA: Diagnosis not present

## 2017-08-24 DIAGNOSIS — N183 Chronic kidney disease, stage 3 (moderate): Secondary | ICD-10-CM | POA: Diagnosis not present

## 2017-09-02 ENCOUNTER — Ambulatory Visit (INDEPENDENT_AMBULATORY_CARE_PROVIDER_SITE_OTHER): Payer: PPO | Admitting: Family Medicine

## 2017-09-02 ENCOUNTER — Ambulatory Visit: Payer: PPO | Admitting: Cardiology

## 2017-09-02 ENCOUNTER — Encounter: Payer: Self-pay | Admitting: Cardiology

## 2017-09-02 ENCOUNTER — Ambulatory Visit (INDEPENDENT_AMBULATORY_CARE_PROVIDER_SITE_OTHER): Payer: PPO | Admitting: Pharmacist

## 2017-09-02 ENCOUNTER — Encounter

## 2017-09-02 ENCOUNTER — Encounter: Payer: Self-pay | Admitting: Family Medicine

## 2017-09-02 VITALS — BP 102/70 | HR 62 | Ht 68.0 in | Wt 195.0 lb

## 2017-09-02 VITALS — BP 110/80 | HR 77 | Temp 98.0°F | Ht 68.0 in | Wt 195.0 lb

## 2017-09-02 DIAGNOSIS — Z952 Presence of prosthetic heart valve: Secondary | ICD-10-CM | POA: Diagnosis not present

## 2017-09-02 DIAGNOSIS — Z5181 Encounter for therapeutic drug level monitoring: Secondary | ICD-10-CM

## 2017-09-02 DIAGNOSIS — Z0000 Encounter for general adult medical examination without abnormal findings: Secondary | ICD-10-CM

## 2017-09-02 DIAGNOSIS — E039 Hypothyroidism, unspecified: Secondary | ICD-10-CM | POA: Diagnosis not present

## 2017-09-02 DIAGNOSIS — F321 Major depressive disorder, single episode, moderate: Secondary | ICD-10-CM | POA: Diagnosis not present

## 2017-09-02 DIAGNOSIS — Z954 Presence of other heart-valve replacement: Secondary | ICD-10-CM | POA: Diagnosis not present

## 2017-09-02 DIAGNOSIS — E1122 Type 2 diabetes mellitus with diabetic chronic kidney disease: Secondary | ICD-10-CM | POA: Diagnosis not present

## 2017-09-02 DIAGNOSIS — I442 Atrioventricular block, complete: Secondary | ICD-10-CM

## 2017-09-02 DIAGNOSIS — N183 Chronic kidney disease, stage 3 (moderate): Secondary | ICD-10-CM

## 2017-09-02 DIAGNOSIS — G4733 Obstructive sleep apnea (adult) (pediatric): Secondary | ICD-10-CM

## 2017-09-02 DIAGNOSIS — E559 Vitamin D deficiency, unspecified: Secondary | ICD-10-CM

## 2017-09-02 DIAGNOSIS — Z8679 Personal history of other diseases of the circulatory system: Secondary | ICD-10-CM

## 2017-09-02 DIAGNOSIS — I4891 Unspecified atrial fibrillation: Secondary | ICD-10-CM | POA: Diagnosis not present

## 2017-09-02 DIAGNOSIS — Z950 Presence of cardiac pacemaker: Secondary | ICD-10-CM

## 2017-09-02 DIAGNOSIS — Z9889 Other specified postprocedural states: Secondary | ICD-10-CM | POA: Diagnosis not present

## 2017-09-02 DIAGNOSIS — I5033 Acute on chronic diastolic (congestive) heart failure: Secondary | ICD-10-CM

## 2017-09-02 LAB — POCT INR: INR: 2.8 (ref 2.0–3.0)

## 2017-09-02 LAB — BASIC METABOLIC PANEL WITH GFR
BUN: 24 mg/dL — ABNORMAL HIGH (ref 6–23)
CO2: 26 meq/L (ref 19–32)
Calcium: 10.8 mg/dL — ABNORMAL HIGH (ref 8.4–10.5)
Chloride: 105 meq/L (ref 96–112)
Creatinine, Ser: 1.49 mg/dL (ref 0.40–1.50)
GFR: 50.42 mL/min — ABNORMAL LOW
Glucose, Bld: 86 mg/dL (ref 70–99)
Potassium: 4.8 meq/L (ref 3.5–5.1)
Sodium: 139 meq/L (ref 135–145)

## 2017-09-02 LAB — HEMOGLOBIN A1C: Hgb A1c MFr Bld: 5.9 % (ref 4.6–6.5)

## 2017-09-02 LAB — VITAMIN D 25 HYDROXY (VIT D DEFICIENCY, FRACTURES): VITD: 43.03 ng/mL (ref 30.00–100.00)

## 2017-09-02 LAB — TSH: TSH: 0.63 u[IU]/mL (ref 0.35–4.50)

## 2017-09-02 MED ORDER — METFORMIN HCL 500 MG PO TABS: 500.0000 mg | ORAL_TABLET | Freq: Two times a day (BID) | ORAL | 1 refills | Status: DC

## 2017-09-02 MED ORDER — LEVOTHYROXINE SODIUM 75 MCG PO TABS: 75.0000 ug | ORAL_TABLET | Freq: Every day | ORAL | 2 refills | Status: DC

## 2017-09-02 MED ORDER — METOPROLOL TARTRATE 25 MG PO TABS: 12.5000 mg | ORAL_TABLET | Freq: Two times a day (BID) | ORAL | 3 refills | Status: DC

## 2017-09-02 MED ORDER — FLUOXETINE HCL 10 MG PO TABS: ORAL_TABLET | ORAL | 0 refills | Status: DC

## 2017-09-02 NOTE — Progress Notes (Addendum)
Subjective:  Patient ID: Kevin English, male    DOB: Aug 21, 1955  Age: 62 y.o. MRN: [id number]  CC: Follow-up   HPI Kevin English presents for follow-up of his depression.  He feels as though his mood has been elevated but is concerned that Paxil may be affecting his energy levels and ability of the sleep.  He has been experiencing a lot of daytime sleepiness and has been taking naps.  They tell me that his CPAP situation has been rectified any has found a mask that works for him and does not leak.  He is using this nightly.  He does not snore when he uses his mask.  His wife asked me to recheck his diabetes status.  He has been taking his metformin twice daily.  She has me today when to check his blood sugars.  He just saw renal recently and they are happy with his renal function.  Just saw a cardiologist and they have actually decreased his metoprolol to 12.5 mg twice daily.  He says that he has been taking his high-dose vitamin D.  Outpatient Medications Prior to Visit  Medication Sig Dispense Refill  . acetaminophen (TYLENOL) 325 MG tablet Take 325 mg by mouth every 6 (six) hours as needed (pain).     Marland Kitchen amLODipine (NORVASC) 10 MG tablet TAKE 1 TABLET (10 MG TOTAL) BY MOUTH DAILY. 90 tablet 2  . aspirin EC 81 MG tablet Take 1 tablet (81 mg total) by mouth daily.    . Blood Glucose Monitoring Suppl (ONE TOUCH ULTRA MINI) w/Device KIT Use meter to check blood sugars 1-4 times daily as instructed. 1 each 0  . glucose blood (ONE TOUCH ULTRA TEST) test strip Use one strip per test. Test blood sugars 1-4 times daily as instructed. 100 each 12  . Lancets Misc. (ONE TOUCH SURESOFT) MISC Use 1 lancet per test. Test blood sugars 1-4 times per day as instructed. 1 each 1  . loratadine (CLARITIN) 10 MG tablet Take 1 tablet (10 mg total) by mouth daily.    Marland Kitchen MELATONIN GUMMIES PO Take by mouth. Pt states taking 2 tablets 20 mg at night but making him more sleepy so taking 1 at night    . metoprolol  tartrate (LOPRESSOR) 25 MG tablet Take 0.5 tablets (12.5 mg total) by mouth 2 (two) times daily. 180 tablet 3  . omega-3 acid ethyl esters (LOVAZA) 1 g capsule TAKE 2 CAPSULES (2 G TOTAL) BY MOUTH 2 (TWO) TIMES DAILY. 360 capsule 1  . Vitamin D, Ergocalciferol, (DRISDOL) 50000 units CAPS capsule Take 1 capsule (50,000 Units total) by mouth every 7 (seven) days. 30 capsule 1  . warfarin (COUMADIN) 5 MG tablet TAKE AS DIRECTED BY ANTICOAGULATION CLINIC 40 tablet 3  . levothyroxine (SYNTHROID, LEVOTHROID) 75 MCG tablet TAKE 1 TABLET (75 MCG TOTAL) BY MOUTH DAILY BEFORE BREAKFAST. 90 tablet 2  . metFORMIN (GLUCOPHAGE) 500 MG tablet TAKE 1 TABLET (500 MG TOTAL) BY MOUTH 2 (TWO) TIMES DAILY WITH A MEAL. 60 tablet 1  . PARoxetine (PAXIL) 30 MG tablet Take 1 tablet (30 mg total) by mouth daily. 30 tablet 2   No facility-administered medications prior to visit.     ROS Review of Systems  Constitutional: Negative for activity change, appetite change, chills, fever and unexpected weight change.  HENT: Negative.   Eyes: Negative.   Respiratory: Negative.   Cardiovascular: Negative.   Gastrointestinal: Negative.   Endocrine: Negative for cold intolerance, heat intolerance, polyphagia and polyuria.  Genitourinary: Negative for decreased urine volume, difficulty urinating, frequency and hematuria.  Musculoskeletal: Negative for gait problem and joint swelling.  Skin: Negative for pallor and rash.  Allergic/Immunologic: Negative for immunocompromised state.  Neurological: Negative for weakness and headaches.  Hematological: Does not bruise/bleed easily.  Psychiatric/Behavioral: Negative for dysphoric mood. The patient is not nervous/anxious.     Objective:  BP 110/80   Pulse 77   Temp 98 F (36.7 C)   Ht 5' 8"  (1.727 m)   Wt 195 lb (88.5 kg)   SpO2 96%   BMI 29.65 kg/m   BP Readings from Last 3 Encounters:  09/02/17 110/80  09/02/17 102/70  07/01/17 122/80    Wt Readings from Last 3  Encounters:  09/02/17 195 lb (88.5 kg)  09/02/17 195 lb (88.5 kg)  07/01/17 197 lb 2 oz (89.4 kg)    Physical Exam  Constitutional: He is oriented to person, place, and time. He appears well-developed and well-nourished. No distress.  HENT:  Head: Normocephalic and atraumatic.  Right Ear: External ear normal.  Left Ear: External ear normal.  Eyes: Conjunctivae are normal. Right eye exhibits no discharge. Left eye exhibits no discharge. No scleral icterus.  Neck: Normal range of motion. No JVD present. No tracheal deviation present. No thyromegaly present.  Cardiovascular: Normal rate, regular rhythm and normal heart sounds.  Pulmonary/Chest: Effort normal and breath sounds normal.  Lymphadenopathy:    He has no cervical adenopathy.  Neurological: He is alert and oriented to person, place, and time.  Skin: Skin is warm and dry. He is not diaphoretic.  Psychiatric: He has a normal mood and affect. His behavior is normal.    Lab Results  Component Value Date   WBC 16.0 (H) 03/03/2016   HGB 18.0 (H) 03/03/2016   HCT 52.8 (H) 03/03/2016   PLT 224 03/03/2016   GLUCOSE 86 09/02/2017   CHOL 160 04/22/2017   TRIG 307 (H) 04/22/2017   HDL 22 (L) 04/22/2017   LDLDIRECT 62.0 12/13/2014   LDLCALC 77 04/22/2017   ALT 30 04/01/2017   AST 21 04/01/2017   NA 139 09/02/2017   K 4.8 09/02/2017   CL 105 09/02/2017   CREATININE 1.49 09/02/2017   BUN 24 (H) 09/02/2017   CO2 26 09/02/2017   TSH 0.63 09/02/2017   PSA 0.54 07/01/2017   INR 2.8 09/02/2017   HGBA1C 5.9 09/02/2017   MICROALBUR 6.5 (H) 04/01/2017    No results found.  Assessment & Plan:   Kevin English was seen today for follow-up.  Diagnoses and all orders for this visit:  Obstructive sleep apnea syndrome  OSA (obstructive sleep apnea)  Current moderate episode of major depressive disorder, unspecified whether recurrent (HCC) -     FLUoxetine (PROZAC) 10 MG tablet; Take one tablet daily for a week and then take  2.  Controlled type 2 diabetes mellitus with stage 3 chronic kidney disease, without long-term current use of insulin (HCC) -     Basic metabolic panel -     Hemoglobin A1c -     metFORMIN (GLUCOPHAGE) 500 MG tablet; Take 1 tablet (500 mg total) by mouth 2 (two) times daily with a meal.  Vitamin D deficiency -     VITAMIN D 25 Hydroxy (Vit-D Deficiency, Fractures)  Hypothyroidism (acquired) -     TSH -     levothyroxine (SYNTHROID, LEVOTHROID) 75 MCG tablet; Take 1 tablet (75 mcg total) by mouth daily before breakfast.   I have discontinued [name]. Terrien's  PARoxetine. I am also having him start on FLUoxetine. Additionally, I am having him maintain his acetaminophen, aspirin EC, loratadine, glucose blood, ONE TOUCH ULTRA MINI, ONE TOUCH SURESOFT, Vitamin D (Ergocalciferol), amLODipine, MELATONIN GUMMIES PO, warfarin, omega-3 acid ethyl esters, metoprolol tartrate, levothyroxine, and metFORMIN.  Meds ordered this encounter  Medications  . FLUoxetine (PROZAC) 10 MG tablet    Sig: Take one tablet daily for a week and then take 2.    Dispense:  60 tablet    Refill:  0    Discussed quick paxil taper with patient. 1/2 daily for 3 days and then 1/2 qod for 3 doses and then stop.  Marland Kitchen levothyroxine (SYNTHROID, LEVOTHROID) 75 MCG tablet    Sig: Take 1 tablet (75 mcg total) by mouth daily before breakfast.    Dispense:  90 tablet    Refill:  2  . metFORMIN (GLUCOPHAGE) 500 MG tablet    Sig: Take 1 tablet (500 mg total) by mouth 2 (two) times daily with a meal.    Dispense:  60 tablet    Refill:  1   We will go ahead and make the change to Prozac to see if this helps his energy levels.  Placed him on a quick Paxil taper over 2 weeks with the initiation of Prozac therapy.  Advised his wife to check his fasting blood sugars and 2-hour postprandials.  Encouraged him to try to avoid daytime naps.  Encouraged increased activity during the daytime.  Follow-up: Return in about 1 month (around  10/02/2017).  Libby Maw, MD

## 2017-09-02 NOTE — Patient Instructions (Signed)
Description   Continue on same dosage 1 tablet daily except take 1.5  tablets Tuesdays, Thursdays, and Saturdays. Recheck in 6 weeks.  Call [phone] with any questions.     

## 2017-09-02 NOTE — Patient Instructions (Signed)
Medication Instructions:   DECREASE YOUR METOPROLOL TARTRATE TO 12.5 MG TWICE DAILY WITH MEALS    Follow-Up:  Your physician wants you to follow-up in: [place] will receive a reminder letter in the mail two months in advance. If you don't receive a letter, please call our office to schedule the follow-up appointment.        If you need a refill on your cardiac medications before your next appointment, please call your pharmacy.

## 2017-09-02 NOTE — Progress Notes (Signed)
Cardiology Office Note:    Date:  09/02/2017   ID:  Kevin English, DOB December 09, 1955, MRN [id number]  PCP:  Libby Maw, MD  Cardiologist:  Dr. Ena Dawley   Electrophysiologist:  Dr. Thompson Grayer  Nephrologist: Dr. Marval Regal  Chief complain: 6 months follow-up  History of Present Illness:     Kevin English is a 62 y.o. male with a hx of HTN, HL (intol to statins), former smoker, PAFib, Rheumatic heart disease with severe mitral stenosis, diastolic HF, depression, CKD, renal cell CA, OSA. He has been on Coumadin. He had been prepared for minimally invasive MVR and placed on Amiodarone. However, he developed clear cell renal cell CA and underwent R nephrectomy. He developed fatigue with beta-blocker and Bystolic was DC'd. He was ultimately stabilized and underwent Minimally-Invasive Mitral Valve Replacement (Sorin Carbomedics Optiform bileaflet mechanical valve) and Maze Procedure with Left atrial lesion set using cryothermy and Clipping of Left Atrial Appendage with Dr. Roxy Manns in 2/16. Post op course was complicated by complete heart block. He ultimately required implantation of a Medtronic Adapta L dual-chamber pacemaker.   03/04/2016 - the patient has been dealing with progressively worsening depression, he meets with his friends but has no interest or joy in life, his children or family life. He states he doesn't care about his heart health because he doesn't feel well. On Tuesday, 03/03/2016 he was at [employer] with his friends when he walked to the bathroom and felt that he is legs were weak and went to the ground and couldn't get up. He denies any chest pain palpitation or dizziness at the time. An ambulance was called and he was taken to the ER. In the ER his labs were normal, and head CT showed no acute changes. He now denies any weakness. He was seen by neurologist yesterday who thinks that this is related to progressive severe depression but order nerve conduction  studies and electromyography study. Today he denies any chest pain and worsening shortness of breath he has been compliant with his warfarin today's INR 2.3. No bleeding. No palpitations. No lower extremity edema orthopnea or personal nocturnal dyspnea.  06/08/2016 - this is 3 months follow-up, the patient is doing well from cardiac standpoint he denies any chest pain, shortness of breath, no lower extremity edema or claudications. He also hasn't experienced any palpitations or syncope. He has been compliant with his medications and has no side effects including no bleeding with warfarin. His most significant problem is depression that prevents him from going outside most days, he refuses to do any exercises and is not motivated. He has seen a therapist but increased Zoloft dose to 100 mg a day. He was also seen by neurologist for muscle weakness with so far negative workup.  01/04/2017, this is a months follow-up, the patient seems to be doing well she still seemed depressed and not motivated, doesn't exercise, leaves house infrequently, however he was recently diagnosed with diabetes and has lost 15 pounds. He denies any chest pain shortness of breath, orthopnea or nocturnal dyspnea no lower extremity edema palpitations dizziness or syncope. No cough. Has been compliant with his medication, his INR is always at goal and has no bleeding on Coumadin.  09/02/2017 -this is 6 months follow-up, the patient has been doing well cardiac wise he denies any chest pain has a stable dyspnea on exertion, has no lower extremity edema no palpitations dizziness or syncope.  He is biggest problem is severe depression for which he has  seen psychiatrist with no improvement, currently followed by his primary care physician Dr. Alfonso Ramus, he literally spends his day in bed, does not feel like getting out feels like he has no energy, as a result he cannot sleep at night and feels tired the next day.  He does not go to gym does not  have any hobbies.   Past Medical History:  Diagnosis Date  . Anxiety   . Borderline diabetes   . CAD (coronary artery disease)    a. By cath 08/2013: 70% distal cx-prox LPDA; tandem mod LAD lesions, o/w mild dz.  . CKD (chronic kidney disease), stage II    his creat. runs 2-2.5; hasnt seen neph yet - sees dr. Tresa [facility]  . Complete heart block (HCC)    a. s/p PPM   . Deafness in left ear   . Depression   . Diabetes mellitus without complication ([place])   . Diverticulitis of colon 15 years ago  . Hernia   . HTN (hypertension)   . Hyperlipidemia   . Hypogonadism male   . Hypothyroidism   . Lung nodule seen on imaging study 04/10/2014   5 mm nodule right lower lobe  . Mitral stenosis    a. Dx 07/2013 - through workup of 2D echo, TEE, and cath, felt to be moderate.  . Obesity   . OSA on CPAP   . Persistent atrial fibrillation ([place])    a. sustained 160-180 on e-CARDIO monitor placed 07/29/2013. Placed on IV amiodarone at end of May 2015 due to continued paroxysms with RVR.  Marland Kitchen Renal cell carcinoma of right kidney ([place]) 09/05/2013   clear cell renal cell carcinoma of right kidney treated by radical nephrectomy, Fuhrman grade 2-3, with maximum tumor diameter 2.7 cm, all tumor to find confined to the kidney, and all surgical margins negative (T1aN0).   . Renal mass    a. Dx 08/2013: concerning for renal cancer.  . Rheumatic heart disease   . S/P Minimally invasive maze operation for atrial fibrillation 05/15/2014   Left side lesion set using cryothermy with clipping of LA appendage  . S/P minimally invasive mitral valve replacement with metallic valve and maze procedure 05/15/2014   37m Sorin Carbomedics Optiform mechanical valve placed via right mini thoracotomy approach  . Stroke ([facility] 1998   "MRI showed 3 mild strokes"    Past Surgical History:  Procedure Laterality Date  . EYE SURGERY Right 1990   "reconstructive, lens implant- from paintball accident"  . INGUINAL HERNIA  REPAIR Bilateral first one in 80's   "I had one side done twice"  . LEFT AND RIGHT HEART CATHETERIZATION WITH CORONARY ANGIOGRAM N/A 08/28/2013   Procedure: LEFT AND RIGHT HEART CATHETERIZATION WITH CORONARY ANGIOGRAM;  Surgeon: DLeonie Man MD;  [facility];  Service: Cardiovascular;  Laterality: N/A;  . MINIMALLY INVASIVE MAZE PROCEDURE N/A 05/15/2014   Procedure: MINIMALLY INVASIVE MAZE PROCEDURE;  Surgeon: CRexene Alberts MD;  [place] [facility]  Service: Open Heart Surgery;  Laterality: N/A;  . MITRAL VALVE REPLACEMENT Right 05/15/2014   Procedure: MINIMALLY INVASIVE MITRAL VALVE (MV) REPLACEMENT;  Surgeon: CRexene Alberts MD;  [place] [facility]  Service: Open Heart Surgery;  Laterality: Right;  . PERMANENT PACEMAKER INSERTION Left 05/22/2014   a. MDT dual chamber PPM implanted by Dr ARayann Hemanfor CHB s/p MAZE/MVR  . ROBOT ASSISTED LAPAROSCOPIC NEPHRECTOMY Right 09/05/2013   Procedure: ROBOTIC ASSISTED LAPAROSCOPIC RIGHT RADICAL NEPHRECTOMY;  Surgeon: DSharyn Creamer MD;  Location:  [facility];  Service: Urology;  Laterality: Right;  . TEE WITHOUT CARDIOVERSION N/A 08/16/2013   Procedure: TRANSESOPHAGEAL ECHOCARDIOGRAM (TEE);  Surgeon: Dorothy Spark, MD;  Location: [place];  Service: Cardiovascular;  Laterality: N/A;  . TEE WITHOUT CARDIOVERSION N/A 05/15/2014   Procedure: TRANSESOPHAGEAL ECHOCARDIOGRAM (TEE);  Surgeon: Rexene Alberts, MD;  Location: [place];  Service: Open Heart Surgery;  Laterality: N/A;    Current Medications: Outpatient Medications Prior to Visit  Medication Sig Dispense Refill  . acetaminophen (TYLENOL) 325 MG tablet Take 325 mg by mouth every 6 (six) hours as needed (pain).     Marland Kitchen amLODipine (NORVASC) 10 MG tablet TAKE 1 TABLET (10 MG TOTAL) BY MOUTH DAILY. 90 tablet 2  . aspirin EC 81 MG tablet Take 1 tablet (81 mg total) by mouth daily.    . Blood Glucose Monitoring Suppl (ONE TOUCH ULTRA MINI) w/Device KIT Use meter to check blood sugars 1-4 times daily as  instructed. 1 each 0  . glucose blood (ONE TOUCH ULTRA TEST) test strip Use one strip per test. Test blood sugars 1-4 times daily as instructed. 100 each 12  . Lancets Misc. (ONE TOUCH SURESOFT) MISC Use 1 lancet per test. Test blood sugars 1-4 times per day as instructed. 1 each 1  . levothyroxine (SYNTHROID, LEVOTHROID) 75 MCG tablet TAKE 1 TABLET (75 MCG TOTAL) BY MOUTH DAILY BEFORE BREAKFAST. 90 tablet 2  . loratadine (CLARITIN) 10 MG tablet Take 1 tablet (10 mg total) by mouth daily.    Marland Kitchen MELATONIN GUMMIES PO Take by mouth. Pt states taking 2 tablets 20 mg at night but making him more sleepy so taking 1 at night    . metFORMIN (GLUCOPHAGE) 500 MG tablet TAKE 1 TABLET (500 MG TOTAL) BY MOUTH 2 (TWO) TIMES DAILY WITH A MEAL. 60 tablet 1  . omega-3 acid ethyl esters (LOVAZA) 1 g capsule TAKE 2 CAPSULES (2 G TOTAL) BY MOUTH 2 (TWO) TIMES DAILY. 360 capsule 1  . PARoxetine (PAXIL) 30 MG tablet Take 1 tablet (30 mg total) by mouth daily. 30 tablet 2  . Vitamin D, Ergocalciferol, (DRISDOL) 50000 units CAPS capsule Take 1 capsule (50,000 Units total) by mouth every 7 (seven) days. 30 capsule 1  . warfarin (COUMADIN) 5 MG tablet TAKE AS DIRECTED BY ANTICOAGULATION CLINIC 40 tablet 3  . metoprolol tartrate (LOPRESSOR) 25 MG tablet Take 25 mg by mouth 2 (two) times daily.    Marland Kitchen ezetimibe (ZETIA) 10 MG tablet Take 1 tablet (10 mg total) by mouth daily. 30 tablet 11   No facility-administered medications prior to visit.      Allergies:   Contrast media [iodinated diagnostic agents]; Ioxaglate; Atorvastatin; Crestor [rosuvastatin]; Fenofibrate; Pravastatin; and Pseudoephedrine hcl   Social History   Socioeconomic History  . Marital status: Married    Spouse name: Not on file  . Number of children: 2  . Years of education: 51  . Highest education level: Not on file  Occupational History  . Occupation: Retired   Scientific laboratory technician  . Financial resource strain: Not on file  . Food insecurity:    Worry:  Not on file    Inability: Not on file  . Transportation needs:    Medical: Not on file    Non-medical: Not on file  Tobacco Use  . Smoking status: Current Some Day Smoker    Packs/day: 0.25    Years: 40.00    Pack years: 10.00    Types: Cigarettes, Cigars  Last attempt to quit: 03/02/2016    Years since quitting: 1.5  . Smokeless tobacco: Never Used  . Tobacco comment: Cigar every now and then  Substance and Sexual Activity  . Alcohol use: No    Alcohol/week: 0.0 oz  . Drug use: No  . Sexual activity: Not Currently  Lifestyle  . Physical activity:    Days per week: Not on file    Minutes per session: Not on file  . Stress: Not on file  Relationships  . Social connections:    Talks on phone: Not on file    Gets together: Not on file    Attends religious service: Not on file    Active member of club or organization: Not on file    Attends meetings of clubs or organizations: Not on file    Relationship status: Not on file  Other Topics Concern  . Not on file  Social History Narrative   Fun: Aggravate people.      Family History:  The patient's family history includes Heart attack in his mother; Hypertension in his mother and unknown relative; Stroke in his mother.   ROS:   Please see the history of present illness.    ROS All other systems reviewed and are negative.   Physical Exam:    VS:  BP 102/70   Pulse 62   Ht 5' 8"  (1.727 m)   Wt 195 lb (88.5 kg)   SpO2 95%   BMI 29.65 kg/m    GEN: Well nourished, well developed, in no acute distress  HEENT: normal  Neck: no JVD, no masses Cardiac: mechanical S1, normal S2, RRR; no murmurs, rubs, or gallops, no edema;  Respiratory:  clear to auscultation bilaterally; no wheezing, rhonchi or rales GI: soft, nontender, nondistended MS: no deformity or atrophy  Skin: warm and dry Neuro: No focal deficits  Psych: Alert and oriented x 3, normal affect  Wt Readings from Last 3 Encounters:  09/02/17 195 lb (88.5 kg)    07/01/17 197 lb 2 oz (89.4 kg)  05/13/17 195 lb (88.5 kg)      Studies/Labs Reviewed:     EKG:  EKG is  ordered today.  The ekg ordered today demonstrates Normal sinus rhythm with right bundle branch block and left anterior fascicular block, unchanged from prior. This was personally reviewed.   Recent Labs: 04/01/2017: ALT 30; BUN 20; Creatinine, Ser 1.58; Potassium 4.5; Sodium 139 07/01/2017: TSH 0.65  Labs at nephrologists office 06/13/15: BUN 17, creatinine 1.50, potassium 5, ALT 25  Recent Lipid Panel    Component Value Date/Time   CHOL 160 04/22/2017 0852   TRIG 307 (H) 04/22/2017 0852   HDL 22 (L) 04/22/2017 0852   CHOLHDL 7.3 (H) 04/22/2017 0852   CHOLHDL 8.4 (H) 03/19/2015 0756   VLDL 54 (H) 03/19/2015 0756   LDLCALC 77 04/22/2017 0852   LDLDIRECT 62.0 12/13/2014 0845    Additional studies/ records that were reviewed today include:    Echo 7/16 Mild LVH, EF 50-55%, normal wall motion, trivial AI, mechanical MVR with normal function, mean gradient 4 mmHg, mild to moderate LAE, normal RV function  Echo 4/16 Mild LVH, EF 45-50%, apical dyskinesis, trivial AI, mechanical MVR okay, severe LAE  Carotid US 04/16/14 Bilateral - 1% to 39% ICA stensosis.  TEE 7/65/46 Normal systolic function, normal wall motion, mild AI, moderate LAE Impressions: Rheumatic mitral valve with moderate leaflet tip thickening and minimal calcifications. Moderate mitral stenosis and trace mitral regurgitation.  Cardiac  cath 08/28/13 LM: Normal LAD: Proximal 40-50%, distal 50-60% LCx: Small OM 70-80% (not amenable to PCI or CABG), PDA 70% LPDA: 10-20% RCA: Mid 30-40%   ASSESSMENT:     1. Encounter for therapeutic drug monitoring   2. H/O mitral valve replacement   3. S/P mitral valve replacement with metallic valve     PLAN:     In order of problems listed above:  1. S/p Mechanical MVR - Echo in 2/18 with normal LV function, well functioning mechanical MVR, normal transmitral  gradients. Continue Coumadin. Continue SBE prophylaxis. No bleeding on Coumadin.  No need for echo right now he has no murmur and no symptoms.  2. PAF - Maintaining NSR. 0% atrial fibrillation on pacemaker interrogation in 04/2017. Continue Coumadin. Bradycardia resolved with decreased dose of metoprolol, I will further decrease to 12.5 mg twice daily as this might be contributing to his depression.  3. HL - Continue current Rx Lovaza and Zetia, statin intolerant.  4. Essential hypertension - rather low, tight blood pressure control needed for CKD stage III, will decrease metoprolol 12.5 mg p.o. twice daily  5. CAD - No angina.  Continue ASA, beta-blocker.  6. OSA - Continue CPAP.   7. Depression -worse, he is strongly advised to go to the gym 5 times a week, help his wife with her projects, do not go to bed during the day just at night, follow with Dr. Alfonso Ramus tomorrow.  Medication Adjustments/Labs and Tests Ordered: Current medicines are reviewed at length with the patient today.  Concerns regarding medicines are outlined above.  Medication changes, Labs and Tests ordered today are outlined in the Patient Instructions noted below. Patient Instructions  Medication Instructions:   DECREASE YOUR METOPROLOL TARTRATE TO 12.5 MG TWICE DAILY WITH MEALS    Follow-Up:  Your physician wants you to follow-up in: [place] will receive a reminder letter in the mail two months in advance. If you don't receive a letter, please call our office to schedule the follow-up appointment.        If you need a refill on your cardiac medications before your next appointment, please call your pharmacy.    Signed, Ena Dawley, MD  09/02/2017 9:32 AM    [facility] [address], [place], [place]  [address] Phone: [phone]; Fax: [phone]

## 2017-09-02 NOTE — Addendum Note (Signed)
Addended by: Jon Billings on: 09/02/2017 04:40 PM   Modules accepted: Orders

## 2017-09-20 ENCOUNTER — Encounter: Payer: Self-pay | Admitting: Family Medicine

## 2017-09-20 ENCOUNTER — Telehealth: Payer: Self-pay

## 2017-09-20 NOTE — Telephone Encounter (Signed)
He should be taking 2 10mg  pills or 20mg . He should be seeing me soon.

## 2017-09-20 NOTE — Telephone Encounter (Signed)
Copied from [place] [id number]. Topic: General - Other >> Sep 20, 2017 10:53 AM Carolyn Stare wrote:  Pt said he don't think this med is working for him. He said he need something to give him some energy    FLUoxetine (PROZAC) 10 MG tablet

## 2017-09-20 NOTE — Telephone Encounter (Signed)
I left a voicemail for patient to call the office back. He should be taking 2 10mg  pills to equal 20 mg. Patient has an appointment on 7/15 and we can discuss changing medication then. Triage nurse may discuss with patient.   Mychart message sent as well.

## 2017-09-21 NOTE — Telephone Encounter (Signed)
Mychart message seen by patient.

## 2017-09-23 ENCOUNTER — Other Ambulatory Visit: Payer: Self-pay | Admitting: Cardiology

## 2017-09-23 ENCOUNTER — Other Ambulatory Visit: Payer: Self-pay | Admitting: Family Medicine

## 2017-09-24 ENCOUNTER — Other Ambulatory Visit: Payer: Self-pay | Admitting: Family Medicine

## 2017-09-24 DIAGNOSIS — F321 Major depressive disorder, single episode, moderate: Secondary | ICD-10-CM

## 2017-09-24 MED ORDER — FLUOXETINE HCL 20 MG PO TABS: 20.0000 mg | ORAL_TABLET | Freq: Every day | ORAL | 1 refills | Status: DC

## 2017-10-04 ENCOUNTER — Encounter: Payer: Self-pay | Admitting: Family Medicine

## 2017-10-04 ENCOUNTER — Ambulatory Visit (INDEPENDENT_AMBULATORY_CARE_PROVIDER_SITE_OTHER): Payer: PPO | Admitting: Family Medicine

## 2017-10-04 VITALS — BP 134/90 | HR 82 | Temp 97.8°F | Ht 68.0 in | Wt 196.4 lb

## 2017-10-04 DIAGNOSIS — F322 Major depressive disorder, single episode, severe without psychotic features: Secondary | ICD-10-CM | POA: Insufficient documentation

## 2017-10-04 DIAGNOSIS — H9190 Unspecified hearing loss, unspecified ear: Secondary | ICD-10-CM

## 2017-10-04 NOTE — Progress Notes (Addendum)
Subjective:  Patient ID: Kevin English, male    DOB: [birth year]  Age: 62 y.o. MRN: [id number]  CC: Follow-up   HPI Kevin English presents for follow-up of his depression.  He has finished his paroxetine taper and has been taking the Prozac 20 mg for only about 2 weeks and has not had much of a response.  He still naps throughout the day and has been reluctant to leave his recliner.  He wakes up in the morning and does not feel rested despite compliance with his CPAP machine.  Denies chest pain shortness of breath dyspnea on exertion or lower extremity swelling.  He is stable from a cardiovascular point of view.  Diabetes and hypothyroidism are well controlled.  He is at stage III of chronic kidney disease.  He has tried Wellbutrin in the past for smoking cessation and gave him bad dreams.  He has taken xanax in the past in the pm but has been off of that for some time now.  Outpatient Medications Prior to Visit  Medication Sig Dispense Refill  . acetaminophen (TYLENOL) 325 MG tablet Take 325 mg by mouth every 6 (six) hours as needed (pain).     Marland Kitchen amLODipine (NORVASC) 10 MG tablet TAKE 1 TABLET (10 MG TOTAL) BY MOUTH DAILY. 90 tablet 2  . aspirin EC 81 MG tablet Take 1 tablet (81 mg total) by mouth daily.    . Blood Glucose Monitoring Suppl (ONE TOUCH ULTRA MINI) w/Device KIT Use meter to check blood sugars 1-4 times daily as instructed. 1 each 0  . FLUoxetine (PROZAC) 20 MG tablet Take 1 tablet (20 mg total) by mouth daily. 30 tablet 1  . glucose blood (ONE TOUCH ULTRA TEST) test strip Use one strip per test. Test blood sugars 1-4 times daily as instructed. 100 each 12  . Lancets Misc. (ONE TOUCH SURESOFT) MISC Use 1 lancet per test. Test blood sugars 1-4 times per day as instructed. 1 each 1  . levothyroxine (SYNTHROID, LEVOTHROID) 75 MCG tablet Take 1 tablet (75 mcg total) by mouth daily before breakfast. 90 tablet 2  . loratadine (CLARITIN) 10 MG tablet Take 1 tablet (10 mg total) by mouth  daily.    Marland Kitchen MELATONIN GUMMIES PO Take by mouth. Pt states taking 2 tablets 20 mg at night but making him more sleepy so taking 1 at night    . metFORMIN (GLUCOPHAGE) 500 MG tablet TAKE 1 TABLET (500 MG TOTAL) BY MOUTH 2 (TWO) TIMES DAILY WITH A MEAL. 60 tablet 1  . metoprolol tartrate (LOPRESSOR) 25 MG tablet Take 0.5 tablets (12.5 mg total) by mouth 2 (two) times daily. 180 tablet 3  . omega-3 acid ethyl esters (LOVAZA) 1 g capsule TAKE 2 CAPSULES (2 G TOTAL) BY MOUTH 2 (TWO) TIMES DAILY. 360 capsule 1  . Vitamin D, Ergocalciferol, (DRISDOL) 50000 units CAPS capsule Take 1 capsule (50,000 Units total) by mouth every 7 (seven) days. 30 capsule 1  . warfarin (COUMADIN) 5 MG tablet TAKE AS DIRECTED BY ANTICOAGULATION CLINIC 40 tablet 3   No facility-administered medications prior to visit.     ROS Review of Systems  Constitutional: Negative.   HENT: Negative.   Eyes: Negative.   Respiratory: Negative.   Cardiovascular: Negative.   Gastrointestinal: Negative.   Neurological: Negative.   Psychiatric/Behavioral: Positive for dysphoric mood, self-injury and sleep disturbance.    Objective:  BP 134/90   Pulse 82   Temp 97.8 F (36.6 C)   Ht 5'  8" (1.727 m)   Wt 196 lb 6 oz (89.1 kg)   SpO2 96%   BMI 29.86 kg/m   BP Readings from Last 3 Encounters:  10/04/17 134/90  09/02/17 110/80  09/02/17 102/70    Wt Readings from Last 3 Encounters:  10/04/17 196 lb 6 oz (89.1 kg)  09/02/17 195 lb (88.5 kg)  09/02/17 195 lb (88.5 kg)    Physical Exam  Constitutional: He is oriented to person, place, and time. He appears well-developed and well-nourished. No distress.  HENT:  Head: Normocephalic and atraumatic.  Right Ear: External ear normal.  Left Ear: External ear normal.  Nose: Nose normal.  Mouth/Throat: Oropharynx is clear and moist.  Eyes: Pupils are equal, round, and reactive to light. Conjunctivae and EOM are normal. Right eye exhibits no discharge. Left eye exhibits no  discharge. No scleral icterus.  Neck: Normal range of motion. Neck supple. No JVD present. No tracheal deviation present. No thyromegaly present.  Cardiovascular: Normal rate, regular rhythm and normal heart sounds.  Pulmonary/Chest: Effort normal and breath sounds normal.  Lymphadenopathy:    He has no cervical adenopathy.  Neurological: He is alert and oriented to person, place, and time.  Skin: Skin is warm and dry. He is not diaphoretic.  Psychiatric: He has a normal mood and affect. His behavior is normal.   Depression screen Kevin English 2/9 10/04/2017 07/01/2017 04/01/2017  Decreased Interest 3 2 3   Down, Depressed, Hopeless 2 2 3   PHQ - 2 Score 5 4 6   Altered sleeping 3 2 3   Tired, decreased energy 3 3 3   Change in appetite 0 0 3  Feeling bad or failure about yourself  2 3 3   Trouble concentrating 2 3 3   Moving slowly or fidgety/restless 2 1 2   Suicidal thoughts 2 0 1  PHQ-9 Score 19 16 24   Some recent data might be hidden    He has thought that things would be better if he were not here but has denied intentional thought of self harm.  Lab Results  Component Value Date   WBC 16.0 (H) 03/03/2016   HGB 18.0 (H) 03/03/2016   HCT 52.8 (H) 03/03/2016   PLT 224 03/03/2016   GLUCOSE 86 09/02/2017   CHOL 160 04/22/2017   TRIG 307 (H) 04/22/2017   HDL 22 (L) 04/22/2017   LDLDIRECT 62.0 12/13/2014   LDLCALC 77 04/22/2017   ALT 30 04/01/2017   AST 21 04/01/2017   NA 139 09/02/2017   K 4.8 09/02/2017   CL 105 09/02/2017   CREATININE 1.49 09/02/2017   BUN 24 (H) 09/02/2017   CO2 26 09/02/2017   TSH 0.63 09/02/2017   PSA 0.54 07/01/2017   INR 2.8 09/02/2017   HGBA1C 5.9 09/02/2017   MICROALBUR 6.5 (H) 04/01/2017    No results found.  Assessment & Plan:   Kevin English was seen today for follow-up.  Diagnoses and all orders for this visit:  Hearing loss, unspecified hearing loss type, unspecified laterality -     Ambulatory referral to ENT  Depression, major, single episode,  severe (Kevin English) -     Ambulatory referral to Psychiatry -     Ambulatory referral to Psychology   I am having Kevin English. Kevin English maintain his acetaminophen, aspirin EC, loratadine, glucose blood, ONE TOUCH ULTRA MINI, ONE TOUCH SURESOFT, Vitamin D (Ergocalciferol), amLODipine, MELATONIN GUMMIES PO, omega-3 acid ethyl esters, metoprolol tartrate, levothyroxine, warfarin, metFORMIN, and FLUoxetine.  No orders of the defined types were placed in this encounter.  Patient will continue Prozac at 20 mg.  He will follow-up in 2 to 3 weeks and will consider doubling the dose depending on his response.  He did have a positive response on the PHQ-9 for self-harm.  He is not thought seriously about hurting himself but the thought has crossed his mind.  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  Wife was advised to put these up for now.  We will go ahead and refer for psychiatric consultation.  Follow-up: Return in about 3 weeks (around 10/25/2017).  Libby Maw, MD

## 2017-10-08 NOTE — Addendum Note (Signed)
Addended by: Jon Billings on: 10/08/2017 02:57 PM   Modules accepted: Level of Service

## 2017-10-13 ENCOUNTER — Telehealth: Payer: Self-pay | Admitting: Cardiology

## 2017-10-13 ENCOUNTER — Ambulatory Visit (INDEPENDENT_AMBULATORY_CARE_PROVIDER_SITE_OTHER): Payer: PPO | Admitting: *Deleted

## 2017-10-13 DIAGNOSIS — I442 Atrioventricular block, complete: Secondary | ICD-10-CM | POA: Diagnosis not present

## 2017-10-13 DIAGNOSIS — N183 Chronic kidney disease, stage 3 (moderate): Secondary | ICD-10-CM | POA: Diagnosis not present

## 2017-10-13 NOTE — Telephone Encounter (Signed)
Spoke with pt and reminded pt of remote transmission that is due today. Pt verbalized understanding.   

## 2017-10-14 ENCOUNTER — Ambulatory Visit: Payer: PPO | Admitting: *Deleted

## 2017-10-14 ENCOUNTER — Encounter: Payer: Self-pay | Admitting: Cardiology

## 2017-10-14 DIAGNOSIS — Z954 Presence of other heart-valve replacement: Secondary | ICD-10-CM | POA: Diagnosis not present

## 2017-10-14 DIAGNOSIS — I4891 Unspecified atrial fibrillation: Secondary | ICD-10-CM

## 2017-10-14 DIAGNOSIS — I5033 Acute on chronic diastolic (congestive) heart failure: Secondary | ICD-10-CM

## 2017-10-14 DIAGNOSIS — Z0000 Encounter for general adult medical examination without abnormal findings: Secondary | ICD-10-CM

## 2017-10-14 DIAGNOSIS — Z5181 Encounter for therapeutic drug level monitoring: Secondary | ICD-10-CM

## 2017-10-14 LAB — POCT INR: INR: 2.8 (ref 2.0–3.0)

## 2017-10-14 NOTE — Patient Instructions (Signed)
Description   Continue on same dosage 1 tablet daily except take 1.5  tablets Tuesdays, Thursdays, and Saturdays. Recheck in 6 weeks.  Call [phone] with any questions.

## 2017-10-14 NOTE — Progress Notes (Signed)
Remote pacemaker transmission.   

## 2017-10-20 DIAGNOSIS — H903 Sensorineural hearing loss, bilateral: Secondary | ICD-10-CM | POA: Diagnosis not present

## 2017-10-20 DIAGNOSIS — Z8669 Personal history of other diseases of the nervous system and sense organs: Secondary | ICD-10-CM | POA: Diagnosis not present

## 2017-10-20 DIAGNOSIS — H918X2 Other specified hearing loss, left ear: Secondary | ICD-10-CM | POA: Diagnosis not present

## 2017-10-25 ENCOUNTER — Encounter: Payer: Self-pay | Admitting: Family Medicine

## 2017-10-25 ENCOUNTER — Ambulatory Visit (INDEPENDENT_AMBULATORY_CARE_PROVIDER_SITE_OTHER): Payer: PPO | Admitting: Family Medicine

## 2017-10-25 VITALS — BP 120/78 | HR 70 | Temp 98.0°F | Ht 68.0 in | Wt 197.8 lb

## 2017-10-25 DIAGNOSIS — G4701 Insomnia due to medical condition: Secondary | ICD-10-CM | POA: Diagnosis not present

## 2017-10-25 DIAGNOSIS — F322 Major depressive disorder, single episode, severe without psychotic features: Secondary | ICD-10-CM | POA: Diagnosis not present

## 2017-10-25 MED ORDER — MIRTAZAPINE 7.5 MG PO TABS: 7.5000 mg | ORAL_TABLET | Freq: Every day | ORAL | 1 refills | Status: DC

## 2017-10-25 NOTE — Patient Instructions (Signed)
Insomnia Insomnia is a sleep disorder that makes it difficult to fall asleep or to stay asleep. Insomnia can cause tiredness (fatigue), low energy, difficulty concentrating, mood swings, and poor performance at work or school. There are three different ways to classify insomnia:  Difficulty falling asleep.  Difficulty staying asleep.  Waking up too early in the morning.  Any type of insomnia can be long-term (chronic) or short-term (acute). Both are common. Short-term insomnia usually lasts for three months or less. Chronic insomnia occurs at least three times a week for longer than three months. What are the causes? Insomnia may be caused by another condition, situation, or substance, such as:  Anxiety.  Certain medicines.  Gastroesophageal reflux disease (GERD) or other gastrointestinal conditions.  Asthma or other breathing conditions.  Restless legs syndrome, sleep apnea, or other sleep disorders.  Chronic pain.  Menopause. This may include hot flashes.  Stroke.  Abuse of alcohol, tobacco, or illegal drugs.  Depression.  Caffeine.  Neurological disorders, such as Alzheimer disease.  An overactive thyroid (hyperthyroidism).  The cause of insomnia may not be known. What increases the risk? Risk factors for insomnia include:  Gender. Women are more commonly affected than men.  Age. Insomnia is more common as you get older.  Stress. This may involve your professional or personal life.  Income. Insomnia is more common in people with lower income.  Lack of exercise.  Irregular work schedule or night shifts.  Traveling between different time zones.  What are the signs or symptoms? If you have insomnia, trouble falling asleep or trouble staying asleep is the main symptom. This may lead to other symptoms, such as:  Feeling fatigued.  Feeling nervous about going to sleep.  Not feeling rested in the morning.  Having trouble concentrating.  Feeling  irritable, anxious, or depressed.  How is this treated? Treatment for insomnia depends on the cause. If your insomnia is caused by an underlying condition, treatment will focus on addressing the condition. Treatment may also include:  Medicines to help you sleep.  Counseling or therapy.  Lifestyle adjustments.  Follow these instructions at home:  Take medicines only as directed by your health care provider.  Keep regular sleeping and waking hours. Avoid naps.  Keep a sleep diary to help you and your health care provider figure out what could be causing your insomnia. Include: ? When you sleep. ? When you wake up during the night. ? How well you sleep. ? How rested you feel the next day. ? Any side effects of medicines you are taking. ? What you eat and drink.  Make your bedroom a comfortable place where it is easy to fall asleep: ? Put up shades or special blackout curtains to block light from outside. ? Use a white noise machine to block noise. ? Keep the temperature cool.  Exercise regularly as directed by your health care provider. Avoid exercising right before bedtime.  Use relaxation techniques to manage stress. Ask your health care provider to suggest some techniques that may work well for you. These may include: ? Breathing exercises. ? Routines to release muscle tension. ? Visualizing peaceful scenes.  Cut back on alcohol, caffeinated beverages, and cigarettes, especially close to bedtime. These can disrupt your sleep.  Do not overeat or eat spicy foods right before bedtime. This can lead to digestive discomfort that can make it hard for you to sleep.  Limit screen use before bedtime. This includes: ? Watching TV. ? Using your smartphone, tablet, and   computer.  Stick to a routine. This can help you fall asleep faster. Try to do a quiet activity, brush your teeth, and go to bed at the same time each night.  Get out of bed if you are still awake after 15 minutes  of trying to sleep. Keep the lights down, but try reading or doing a quiet activity. When you feel sleepy, go back to bed.  Make sure that you drive carefully. Avoid driving if you feel very sleepy.  Keep all follow-up appointments as directed by your health care provider. This is important. Contact a health care provider if:  You are tired throughout the day or have trouble in your daily routine due to sleepiness.  You continue to have sleep problems or your sleep problems get worse. Get help right away if:  You have serious thoughts about hurting yourself or someone else. This information is not intended to replace advice given to you by your health care provider. Make sure you discuss any questions you have with your health care provider. Document Released: 03/06/2000 Document Revised: 08/09/2015 Document Reviewed: 12/08/2013 Elsevier Interactive Patient Education  2018 [employer]. Mirtazapine tablets What is this medicine? MIRTAZAPINE (mir TAZ a peen) is used to treat depression. This medicine may be used for other purposes; ask your health care provider or pharmacist if you have questions. COMMON BRAND NAME(S): Remeron What should I tell my health care provider before I take this medicine? They need to know if you have any of these conditions: -bipolar disorder -glaucoma -kidney disease -liver disease -suicidal thoughts -an unusual or allergic reaction to mirtazapine, other medicines, foods, dyes, or preservatives -pregnant or trying to get pregnant -breast-feeding How should I use this medicine? Take this medicine by mouth with a glass of water. Follow the directions on the prescription label. Take your medicine at regular intervals. Do not take your medicine more often than directed. Do not stop taking this medicine suddenly except upon the advice of your doctor. Stopping this medicine too quickly may cause serious side effects or your condition may worsen. A special  MedGuide will be given to you by the pharmacist with each prescription and refill. Be sure to read this information carefully each time. Talk to your pediatrician regarding the use of this medicine in children. Special care may be needed. Overdosage: If you think you have taken too much of this medicine contact a poison control center or emergency room at once. NOTE: This medicine is only for you. Do not share this medicine with others. What if I miss a dose? If you miss a dose, take it as soon as you can. If it is almost time for your next dose, take only that dose. Do not take double or extra doses. What may interact with this medicine? Do not take this medicine with any of the following medications: -linezolid -MAOIs like Carbex, Eldepryl, Marplan, Nardil, and Parnate -methylene blue (injected into a vein) This medicine may also interact with the following medications: -alcohol -antiviral medicines for HIV or AIDS -certain medicines that treat or prevent blood clots like warfarin -certain medicines for depression, anxiety, or psychotic disturbances -certain medicines for fungal infections like ketoconazole and itraconazole -certain medicines for migraine headache like almotriptan, eletriptan, frovatriptan, naratriptan, rizatriptan, sumatriptan, zolmitriptan -certain medicines for seizures like carbamazepine or phenytoin -certain medicines for sleep -cimetidine -erythromycin -fentanyl -lithium -medicines for blood pressure -nefazodone -rasagiline -rifampin -supplements like St. John's wort, kava kava, valerian -tramadol -tryptophan This list may not describe all  possible interactions. Give your health care provider a list of all the medicines, herbs, non-prescription drugs, or dietary supplements you use. Also tell them if you smoke, drink alcohol, or use illegal drugs. Some items may interact with your medicine. What should I watch for while using this medicine? Tell your doctor  if your symptoms do not get better or if they get worse. Visit your doctor or health care professional for regular checks on your progress. Because it may take several weeks to see the full effects of this medicine, it is important to continue your treatment as prescribed by your doctor. Patients and their families should watch out for new or worsening thoughts of suicide or depression. Also watch out for sudden changes in feelings such as feeling anxious, agitated, panicky, irritable, hostile, aggressive, impulsive, severely restless, overly excited and hyperactive, or not being able to sleep. If this happens, especially at the beginning of treatment or after a change in dose, call your health care professional. Dennis Bast may get drowsy or dizzy. Do not drive, use machinery, or do anything that needs mental alertness until you know how this medicine affects you. Do not stand or sit up quickly, especially if you are an older patient. This reduces the risk of dizzy or fainting spells. Alcohol may interfere with the effect of this medicine. Avoid alcoholic drinks. This medicine may cause dry eyes and blurred vision. If you wear contact lenses you may feel some discomfort. Lubricating drops may help. See your eye doctor if the problem does not go away or is severe. Your mouth may get dry. Chewing sugarless gum or sucking hard candy, and drinking plenty of water may help. Contact your doctor if the problem does not go away or is severe. What side effects may I notice from receiving this medicine? Side effects that you should report to your doctor or health care professional as soon as possible: -allergic reactions like skin rash, itching or hives, swelling of the face, lips, or tongue -anxious -changes in vision -chest pain -confusion -elevated mood, decreased need for sleep, racing thoughts, impulsive behavior -eye pain -fast, irregular heartbeat -feeling faint or lightheaded, falls -feeling agitated, angry,  or irritable -fever or chills, sore throat -hallucination, loss of contact with reality -loss of balance or coordination -mouth sores -redness, blistering, peeling or loosening of the skin, including inside the mouth -restlessness, pacing, inability to keep still -seizures -stiff muscles -suicidal thoughts or other mood changes -trouble passing urine or change in the amount of urine -trouble sleeping -unusual bleeding or bruising -unusually weak or tired -vomiting Side effects that usually do not require medical attention (report to your doctor or health care professional if they continue or are bothersome): -change in appetite -constipation -dizziness -dry mouth -muscle aches or pains -nausea -tired -weight gain This list may not describe all possible side effects. Call your doctor for medical advice about side effects. You may report side effects to FDA at 1-800-FDA-1088. Where should I keep my medicine? Keep out of the reach of children. Store at room temperature between 15 and 30 degrees C (59 and 86 degrees F) Protect from light and moisture. Throw away any unused medicine after the expiration date. NOTE: This sheet is a summary. It may not cover all possible information. If you have questions about this medicine, talk to your doctor, pharmacist, or health care provider.  2018 Elsevier/Gold Standard (2015-08-08 17:30:45)

## 2017-10-25 NOTE — Progress Notes (Signed)
Subjective:  Patient ID: Kevin English, male    DOB: 20-Jun-1955  Age: 62 y.o. MRN: [id number]  CC: Follow-up (pt sts he is feeling good, pt sts his depression has been better)   HPI Kevin English presents for follow-up of his depression.  He is doing much better from a depression point of view.  There are no longer any thoughts of self-harm.  He continues to suffer from lack of energy and motivation.  He feels as though the major issue is lack of sleep.  On the other hand he is not motivated to move, exercise and does tend to sleep throughout the day.  He is not drinking alcohol or using illicit drugs.  He does little more than sit throughout the day.  He has a scheduled appointment with a psychiatrist on September 6.  Outpatient Medications Prior to Visit  Medication Sig Dispense Refill  . acetaminophen (TYLENOL) 325 MG tablet Take 325 mg by mouth every 6 (six) hours as needed (pain).     Marland Kitchen amLODipine (NORVASC) 10 MG tablet TAKE 1 TABLET (10 MG TOTAL) BY MOUTH DAILY. 90 tablet 2  . aspirin EC 81 MG tablet Take 1 tablet (81 mg total) by mouth daily.    . Blood Glucose Monitoring Suppl (ONE TOUCH ULTRA MINI) w/Device KIT Use meter to check blood sugars 1-4 times daily as instructed. 1 each 0  . FLUoxetine (PROZAC) 20 MG tablet Take 1 tablet (20 mg total) by mouth daily. 30 tablet 1  . glucose blood (ONE TOUCH ULTRA TEST) test strip Use one strip per test. Test blood sugars 1-4 times daily as instructed. 100 each 12  . Lancets Misc. (ONE TOUCH SURESOFT) MISC Use 1 lancet per test. Test blood sugars 1-4 times per day as instructed. 1 each 1  . levothyroxine (SYNTHROID, LEVOTHROID) 75 MCG tablet Take 1 tablet (75 mcg total) by mouth daily before breakfast. 90 tablet 2  . loratadine (CLARITIN) 10 MG tablet Take 1 tablet (10 mg total) by mouth daily.    Marland Kitchen MELATONIN GUMMIES PO Take by mouth. Pt states taking 2 tablets 20 mg at night but making him more sleepy so taking 1 at night    . metFORMIN  (GLUCOPHAGE) 500 MG tablet TAKE 1 TABLET (500 MG TOTAL) BY MOUTH 2 (TWO) TIMES DAILY WITH A MEAL. 60 tablet 1  . metoprolol tartrate (LOPRESSOR) 25 MG tablet Take 0.5 tablets (12.5 mg total) by mouth 2 (two) times daily. 180 tablet 3  . omega-3 acid ethyl esters (LOVAZA) 1 g capsule TAKE 2 CAPSULES (2 G TOTAL) BY MOUTH 2 (TWO) TIMES DAILY. 360 capsule 1  . Vitamin D, Ergocalciferol, (DRISDOL) 50000 units CAPS capsule Take 1 capsule (50,000 Units total) by mouth every 7 (seven) days. 30 capsule 1  . warfarin (COUMADIN) 5 MG tablet TAKE AS DIRECTED BY ANTICOAGULATION CLINIC 40 tablet 3   No facility-administered medications prior to visit.     ROS Review of Systems  Constitutional: Negative.   HENT: Negative.   Eyes: Negative.   Respiratory: Negative.   Cardiovascular: Negative.   Gastrointestinal: Negative.   Psychiatric/Behavioral: Positive for dysphoric mood. Negative for behavioral problems, self-injury and suicidal ideas. The patient is not nervous/anxious.     Objective:  BP 120/78   Pulse 70   Temp 98 F (36.7 C) (Oral)   Ht _0  (1.727 m)   Wt 197 lb 12.8 oz (89.7 kg)   SpO2 97%   BMI 30.08 kg/m  BP Readings from Last 3 Encounters:  10/25/17 120/78  10/04/17 134/90  09/02/17 110/80    Wt Readings from Last 3 Encounters:  10/25/17 197 lb 12.8 oz (89.7 kg)  10/04/17 196 lb 6 oz (89.1 kg)  09/02/17 195 lb (88.5 kg)    Physical Exam  Constitutional: He is oriented to person, place, and time. He appears well-developed and well-nourished. No distress.  HENT:  Head: Normocephalic and atraumatic.  Right Ear: External ear normal.  Left Ear: External ear normal.  Eyes: Right eye exhibits no discharge. Left eye exhibits no discharge. No scleral icterus.  Neck: No JVD present. No tracheal deviation present.  Pulmonary/Chest: Effort normal.  Neurological: He is alert and oriented to person, place, and time.  Skin: He is not diaphoretic.  Psychiatric: He has a normal  mood and affect. His behavior is normal.   Depression screen [facility] 2/9 10/25/2017 10/04/2017 07/01/2017  Decreased Interest _0 Down, Depressed, Hopeless 0 2 2  PHQ - 2 Score _1 Altered sleeping _2 Tired, decreased energy _3 Change in appetite 0 0 0  Feeling bad or failure about yourself  0 2 3  Trouble concentrating 0 2 3  Moving slowly or fidgety/restless 0 2 1  Suicidal thoughts 0 2 0  PHQ-9 Score _4 Some recent data might be hidden     Lab Results  Component Value Date   WBC 16.0 (H) 03/03/2016   HGB 18.0 (H) 03/03/2016   HCT 52.8 (H) 03/03/2016   PLT 224 03/03/2016   GLUCOSE 86 09/02/2017   CHOL 160 04/22/2017   TRIG 307 (H) 04/22/2017   HDL 22 (L) 04/22/2017   LDLDIRECT 62.0 12/13/2014   LDLCALC 77 04/22/2017   ALT 30 04/01/2017   AST 21 04/01/2017   NA 139 09/02/2017   K 4.8 09/02/2017   CL 105 09/02/2017   CREATININE 1.49 09/02/2017   BUN 24 (H) 09/02/2017   CO2 26 09/02/2017   TSH 0.63 09/02/2017   PSA 0.54 07/01/2017   INR 2.8 10/14/2017   HGBA1C 5.9 09/02/2017   MICROALBUR 6.5 (H) 04/01/2017    No results found.  Assessment & Plan:   [name] was seen today for follow-up.  Diagnoses and all orders for this visit:  Depression, major, single episode, severe ([place])  Insomnia due to medical condition -     mirtazapine (REMERON) 7.5 MG tablet; Take 1 tablet (7.5 mg total) by mouth at bedtime.   I am having [name] start on mirtazapine. I am also having him maintain his acetaminophen, aspirin EC, loratadine, glucose blood, ONE TOUCH ULTRA MINI, ONE TOUCH SURESOFT, Vitamin D (Ergocalciferol), amLODipine, MELATONIN GUMMIES PO, omega-3 acid ethyl esters, metoprolol tartrate, levothyroxine, warfarin, metFORMIN, and FLUoxetine.  Meds ordered this encounter  Medications  . mirtazapine (REMERON) 7.5 MG tablet    Sig: Take 1 tablet (7.5 mg total) by mouth at bedtime.    Dispense:  30 tablet    Refill:  1  Tough decision here.  On one  hand patient may benefit with increased motivation with increased dose of Prozac.  He feels that if he could just get some sleep he would feel better and feel more like exerting himself.  We will go ahead and add Remeron at 7.5 mg nightly.  Advised wife that symptoms may be worse over the next 3 to 4 days.  They will follow-up with me in 2 weeks. Follow-up:  Return in about 2 weeks (around 11/08/2017).    Libby Maw, MD

## 2017-10-28 ENCOUNTER — Telehealth: Payer: Self-pay | Admitting: Family Medicine

## 2017-10-28 NOTE — Telephone Encounter (Signed)
Copied from [place] [id number]. Topic: Quick Communication - See Telephone Encounter >> Oct 28, 2017  1:22 PM Mylinda Latina, NT wrote: CRM for notification. See Telephone encounter for: 10/28/17. Patient called and states that he was prescribed a medication mirtazapine (REMERON) 7.5 MG tablet and it worked for the 1st night then after that it is not working  He is wondering if the dosage can be increased or changed to something else. He states it is not working . Please call  CB# [phone]

## 2017-10-28 NOTE — Telephone Encounter (Signed)
Increase dosage to 2 pills nightly.

## 2017-10-28 NOTE — Telephone Encounter (Signed)
Patient called and says Remeron 7.5 mg is not working anymore. He is wondering if the dosage can be increased or changed to something else, CB# [phone].  Last OV:10/25/17 [id number] Pharmacy: [id number] - [place], [place] [phone] (Phone) [phone] (Fax)

## 2017-10-28 NOTE — Telephone Encounter (Signed)
Patient is aware that he can increase the Remeron to 2 tablets at night. Patient will let us know how this works for him.

## 2017-11-08 ENCOUNTER — Encounter: Payer: Self-pay | Admitting: Family Medicine

## 2017-11-08 ENCOUNTER — Ambulatory Visit (INDEPENDENT_AMBULATORY_CARE_PROVIDER_SITE_OTHER): Payer: PPO | Admitting: Family Medicine

## 2017-11-08 VITALS — BP 128/80 | HR 76 | Ht 68.0 in | Wt 197.0 lb

## 2017-11-08 DIAGNOSIS — F322 Major depressive disorder, single episode, severe without psychotic features: Secondary | ICD-10-CM

## 2017-11-08 DIAGNOSIS — G4701 Insomnia due to medical condition: Secondary | ICD-10-CM

## 2017-11-08 MED ORDER — SUVOREXANT 10 MG PO TABS: 1.0000 | ORAL_TABLET | Freq: Every day | ORAL | 1 refills | Status: DC

## 2017-11-08 NOTE — Progress Notes (Signed)
Subjective:  Patient ID: Kevin English, male    DOB: 1956/03/11  Age: 62 y.o. MRN: [id number]  CC: Follow-up   HPI Kevin English presents for of depression and anxiety.  Patient does feel better on the Prozac.  His depressed mood is lifted to a certain extent but he is still not as active as he has been in his younger years.  Remeron was not effective.   Outpatient Medications Prior to Visit  Medication Sig Dispense Refill  . acetaminophen (TYLENOL) 325 MG tablet Take 325 mg by mouth every 6 (six) hours as needed (pain).     Marland Kitchen amLODipine (NORVASC) 10 MG tablet TAKE 1 TABLET (10 MG TOTAL) BY MOUTH DAILY. 90 tablet 2  . aspirin EC 81 MG tablet Take 1 tablet (81 mg total) by mouth daily.    . Blood Glucose Monitoring Suppl (ONE TOUCH ULTRA MINI) w/Device KIT Use meter to check blood sugars 1-4 times daily as instructed. 1 each 0  . FLUoxetine (PROZAC) 20 MG tablet Take 1 tablet (20 mg total) by mouth daily. 30 tablet 1  . glucose blood (ONE TOUCH ULTRA TEST) test strip Use one strip per test. Test blood sugars 1-4 times daily as instructed. 100 each 12  . Lancets Misc. (ONE TOUCH SURESOFT) MISC Use 1 lancet per test. Test blood sugars 1-4 times per day as instructed. 1 each 1  . levothyroxine (SYNTHROID, LEVOTHROID) 75 MCG tablet Take 1 tablet (75 mcg total) by mouth daily before breakfast. 90 tablet 2  . loratadine (CLARITIN) 10 MG tablet Take 1 tablet (10 mg total) by mouth daily.    Marland Kitchen MELATONIN GUMMIES PO Take by mouth. Pt states taking 2 tablets 20 mg at night but making him more sleepy so taking 1 at night    . metFORMIN (GLUCOPHAGE) 500 MG tablet TAKE 1 TABLET (500 MG TOTAL) BY MOUTH 2 (TWO) TIMES DAILY WITH A MEAL. 60 tablet 1  . metoprolol tartrate (LOPRESSOR) 25 MG tablet Take 0.5 tablets (12.5 mg total) by mouth 2 (two) times daily. 180 tablet 3  . omega-3 acid ethyl esters (LOVAZA) 1 g capsule TAKE 2 CAPSULES (2 G TOTAL) BY MOUTH 2 (TWO) TIMES DAILY. 360 capsule 1  . Vitamin D,  Ergocalciferol, (DRISDOL) 50000 units CAPS capsule Take 1 capsule (50,000 Units total) by mouth every 7 (seven) days. 30 capsule 1  . warfarin (COUMADIN) 5 MG tablet TAKE AS DIRECTED BY ANTICOAGULATION CLINIC 40 tablet 3  . mirtazapine (REMERON) 7.5 MG tablet Take 1 tablet (7.5 mg total) by mouth at bedtime. 30 tablet 1   No facility-administered medications prior to visit.     ROS Review of Systems  Constitutional: Negative.   Respiratory: Negative.   Cardiovascular: Negative.   Gastrointestinal: Negative.   Psychiatric/Behavioral: Positive for sleep disturbance. Negative for self-injury and suicidal ideas.    Objective:  BP 128/80   Pulse 76   Ht 5' 8"  (1.727 m)   Wt 197 lb (89.4 kg)   SpO2 96%   BMI 29.95 kg/m   BP Readings from Last 3 Encounters:  11/08/17 128/80  10/25/17 120/78  10/04/17 134/90    Wt Readings from Last 3 Encounters:  11/08/17 197 lb (89.4 kg)  10/25/17 197 lb 12.8 oz (89.7 kg)  10/04/17 196 lb 6 oz (89.1 kg)    Physical Exam  Constitutional: He appears well-developed and well-nourished. No distress.  HENT:  Head: Normocephalic and atraumatic.  Right Ear: External ear normal.  Left Ear:  External ear normal.  Eyes: Right eye exhibits no discharge. Left eye exhibits no discharge. No scleral icterus.  Neck: No JVD present. No tracheal deviation present.  Pulmonary/Chest: Breath sounds normal.  Skin: Skin is warm and dry. He is not diaphoretic.  Psychiatric: He has a normal mood and affect. His behavior is normal.    Lab Results  Component Value Date   WBC 16.0 (H) 03/03/2016   HGB 18.0 (H) 03/03/2016   HCT 52.8 (H) 03/03/2016   PLT 224 03/03/2016   GLUCOSE 86 09/02/2017   CHOL 160 04/22/2017   TRIG 307 (H) 04/22/2017   HDL 22 (L) 04/22/2017   LDLDIRECT 62.0 12/13/2014   LDLCALC 77 04/22/2017   ALT 30 04/01/2017   AST 21 04/01/2017   NA 139 09/02/2017   K 4.8 09/02/2017   CL 105 09/02/2017   CREATININE 1.49 09/02/2017   BUN 24 (H)  09/02/2017   CO2 26 09/02/2017   TSH 0.63 09/02/2017   PSA 0.54 07/01/2017   INR 2.8 10/14/2017   HGBA1C 5.9 09/02/2017   MICROALBUR 6.5 (H) 04/01/2017    No results found.  Assessment & Plan:   Kevin English was seen today for follow-up.  Diagnoses and all orders for this visit:  Insomnia due to medical condition -     Suvorexant (BELSOMRA) 10 MG TABS; Take 1 tablet by mouth at bedtime.  Depression, major, single episode, severe ([place])   I have discontinued Zenia Resides R. Wymer's mirtazapine. I am also having him start on Suvorexant. Additionally, I am having him maintain his acetaminophen, aspirin EC, loratadine, glucose blood, ONE TOUCH ULTRA MINI, ONE TOUCH SURESOFT, Vitamin D (Ergocalciferol), amLODipine, MELATONIN GUMMIES PO, omega-3 acid ethyl esters, metoprolol tartrate, levothyroxine, warfarin, metFORMIN, and FLUoxetine.  Meds ordered this encounter  Medications  . Suvorexant (BELSOMRA) 10 MG TABS    Sig: Take 1 tablet by mouth at bedtime.    Dispense:  30 tablet    Refill:  1   We will try Kevin English.  Patient was given good Rx card.  Follow-up in 1 month.  Follow-up: Return in about 1 month (around 12/09/2017).  Libby Maw, MD

## 2017-11-16 ENCOUNTER — Other Ambulatory Visit: Payer: Self-pay | Admitting: Family Medicine

## 2017-11-16 DIAGNOSIS — F321 Major depressive disorder, single episode, moderate: Secondary | ICD-10-CM

## 2017-11-22 ENCOUNTER — Other Ambulatory Visit: Payer: Self-pay | Admitting: Cardiology

## 2017-11-22 LAB — CUP PACEART REMOTE DEVICE CHECK
Battery Impedance: 183 Ohm
Battery Remaining Longevity: 130 mo
Battery Voltage: 2.78 V
Implantable Lead Implant Date: 20160301
Implantable Lead Implant Date: 20160301
Implantable Lead Location: 753859
Implantable Lead Location: 753860
Implantable Lead Model: 5076
Implantable Lead Model: 5076
Implantable Pulse Generator Implant Date: 20160301
Lead Channel Pacing Threshold Amplitude: 0.625 V
Lead Channel Pacing Threshold Amplitude: 0.625 V
Lead Channel Pacing Threshold Pulse Width: 0.4 ms
Lead Channel Pacing Threshold Pulse Width: 0.4 ms
Lead Channel Setting Pacing Amplitude: 2 V
Lead Channel Setting Pacing Amplitude: 2.5 V
Lead Channel Setting Pacing Pulse Width: 0.4 ms
MDC IDC MSMT LEADCHNL RA IMPEDANCE VALUE: 431 Ohm
MDC IDC MSMT LEADCHNL RV IMPEDANCE VALUE: 674 Ohm
MDC IDC SESS DTM: 20190724193859
MDC IDC SET LEADCHNL RV SENSING SENSITIVITY: 4 mV
MDC IDC STAT BRADY AP VP PERCENT: 0 %
MDC IDC STAT BRADY AP VS PERCENT: 54 %
MDC IDC STAT BRADY AS VP PERCENT: 0 %
MDC IDC STAT BRADY AS VS PERCENT: 46 %

## 2017-11-25 ENCOUNTER — Ambulatory Visit (INDEPENDENT_AMBULATORY_CARE_PROVIDER_SITE_OTHER): Payer: PPO | Admitting: *Deleted

## 2017-11-25 DIAGNOSIS — I5033 Acute on chronic diastolic (congestive) heart failure: Secondary | ICD-10-CM

## 2017-11-25 DIAGNOSIS — I4891 Unspecified atrial fibrillation: Secondary | ICD-10-CM | POA: Diagnosis not present

## 2017-11-25 DIAGNOSIS — Z954 Presence of other heart-valve replacement: Secondary | ICD-10-CM

## 2017-11-25 DIAGNOSIS — Z5181 Encounter for therapeutic drug level monitoring: Secondary | ICD-10-CM | POA: Diagnosis not present

## 2017-11-25 DIAGNOSIS — I050 Rheumatic mitral stenosis: Secondary | ICD-10-CM

## 2017-11-25 DIAGNOSIS — Z0000 Encounter for general adult medical examination without abnormal findings: Secondary | ICD-10-CM

## 2017-11-25 LAB — POCT INR: INR: 3.2 — AB (ref 2.0–3.0)

## 2017-11-25 NOTE — Patient Instructions (Signed)
Description   Continue on same dosage 1 tablet daily except take 1.5  tablets Tuesdays, Thursdays, and Saturdays. Recheck in 6 weeks.  Call [phone] with any questions.

## 2017-11-26 ENCOUNTER — Ambulatory Visit: Payer: PPO | Admitting: Psychology

## 2017-11-26 DIAGNOSIS — F321 Major depressive disorder, single episode, moderate: Secondary | ICD-10-CM

## 2017-12-02 ENCOUNTER — Ambulatory Visit: Payer: PPO | Admitting: Psychology

## 2017-12-02 DIAGNOSIS — F321 Major depressive disorder, single episode, moderate: Secondary | ICD-10-CM

## 2017-12-02 DIAGNOSIS — H903 Sensorineural hearing loss, bilateral: Secondary | ICD-10-CM | POA: Diagnosis not present

## 2017-12-09 ENCOUNTER — Ambulatory Visit (INDEPENDENT_AMBULATORY_CARE_PROVIDER_SITE_OTHER): Payer: PPO | Admitting: Family Medicine

## 2017-12-09 ENCOUNTER — Encounter: Payer: Self-pay | Admitting: Family Medicine

## 2017-12-09 VITALS — BP 130/80 | HR 84 | Ht 68.0 in | Wt 203.4 lb

## 2017-12-09 DIAGNOSIS — F322 Major depressive disorder, single episode, severe without psychotic features: Secondary | ICD-10-CM

## 2017-12-09 MED ORDER — BUPROPION HCL 75 MG PO TABS: ORAL_TABLET | ORAL | 1 refills | Status: DC

## 2017-12-09 NOTE — Progress Notes (Signed)
Subjective:  Patient ID: Kevin English, male    DOB: 12-Mar-1956  Age: 62 y.o. MRN: [id number]  CC: Follow-up   HPI LORIMER TIBERIO presents for follow-up of his depression.  Unable to fill the Belsomra secondary to cost.  Continues to take the Prozac daily.  Feels as though it is helping.  He has seen banding twice and will be following up with her soon.  He is accompanied by his wife is who feels as though he is getting plenty of sleep I will be at much of it is during the day.  He is using his CPAP machine daily.  Outpatient Medications Prior to Visit  Medication Sig Dispense Refill  . acetaminophen (TYLENOL) 325 MG tablet Take 325 mg by mouth every 6 (six) hours as needed (pain).     Marland Kitchen amLODipine (NORVASC) 10 MG tablet TAKE 1 TABLET (10 MG TOTAL) BY MOUTH DAILY. 90 tablet 2  . aspirin EC 81 MG tablet Take 1 tablet (81 mg total) by mouth daily.    . Blood Glucose Monitoring Suppl (ONE TOUCH ULTRA MINI) w/Device KIT Use meter to check blood sugars 1-4 times daily as instructed. 1 each 0  . FLUoxetine (PROZAC) 20 MG tablet TAKE 1 TABLET BY MOUTH EVERY DAY 90 tablet 1  . glucose blood (ONE TOUCH ULTRA TEST) test strip Use one strip per test. Test blood sugars 1-4 times daily as instructed. 100 each 12  . Lancets Misc. (ONE TOUCH SURESOFT) MISC Use 1 lancet per test. Test blood sugars 1-4 times per day as instructed. 1 each 1  . levothyroxine (SYNTHROID, LEVOTHROID) 75 MCG tablet Take 1 tablet (75 mcg total) by mouth daily before breakfast. 90 tablet 2  . loratadine (CLARITIN) 10 MG tablet Take 1 tablet (10 mg total) by mouth daily.    Marland Kitchen MELATONIN GUMMIES PO Take by mouth. Pt states taking 2 tablets 20 mg at night but making him more sleepy so taking 1 at night    . metFORMIN (GLUCOPHAGE) 500 MG tablet TAKE 1 TABLET (500 MG TOTAL) BY MOUTH 2 (TWO) TIMES DAILY WITH A MEAL. 60 tablet 1  . metoprolol tartrate (LOPRESSOR) 25 MG tablet Take 0.5 tablets (12.5 mg total) by mouth 2 (two) times daily.  180 tablet 3  . omega-3 acid ethyl esters (LOVAZA) 1 g capsule TAKE 2 CAPSULES (2 G TOTAL) BY MOUTH 2 (TWO) TIMES DAILY. 360 capsule 1  . Vitamin D, Ergocalciferol, (DRISDOL) 50000 units CAPS capsule Take 1 capsule (50,000 Units total) by mouth every 7 (seven) days. 30 capsule 1  . warfarin (COUMADIN) 5 MG tablet TAKE AS DIRECTED BY ANTICOAGULATION CLINIC 40 tablet 3  . Suvorexant (BELSOMRA) 10 MG TABS Take 1 tablet by mouth at bedtime. 30 tablet 1   No facility-administered medications prior to visit.     ROS Review of Systems  Constitutional: Negative.   HENT: Negative.   Eyes: Negative for photophobia and visual disturbance.  Respiratory: Negative.   Cardiovascular: Negative.   Gastrointestinal: Negative.   Endocrine: Negative for polyphagia and polyuria.  Genitourinary: Negative for frequency.  Musculoskeletal: Negative for gait problem and joint swelling.  Skin: Negative for pallor and rash.  Allergic/Immunologic: Negative for immunocompromised state.  Neurological: Negative for tremors and headaches.  Hematological: Does not bruise/bleed easily.  Psychiatric/Behavioral: Positive for dysphoric mood and sleep disturbance. Negative for agitation.    Objective:  BP 130/80   Pulse 84   Ht 5' 8"  (1.727 m)   Wt 203 lb  6 oz (92.3 kg)   SpO2 96%   BMI 30.92 kg/m   BP Readings from Last 3 Encounters:  12/09/17 130/80  11/08/17 128/80  10/25/17 120/78    Wt Readings from Last 3 Encounters:  12/09/17 203 lb 6 oz (92.3 kg)  11/08/17 197 lb (89.4 kg)  10/25/17 197 lb 12.8 oz (89.7 kg)    Physical Exam  Constitutional: He is oriented to person, place, and time. He appears well-developed and well-nourished. No distress.  HENT:  Head: Normocephalic and atraumatic.  Right Ear: External ear normal.  Left Ear: External ear normal.  Eyes: Right eye exhibits no discharge. Left eye exhibits no discharge. No scleral icterus.  Neck: No JVD present. No tracheal deviation present.    Pulmonary/Chest: Effort normal.  Neurological: He is alert and oriented to person, place, and time.  Skin: Skin is warm and dry. He is not diaphoretic.  Psychiatric: His affect is blunt. His speech is delayed. He is slowed. He exhibits a depressed mood.   Depression screen [facility] 2/9 12/09/2017 11/08/2017 10/25/2017  Decreased Interest 0 0 1  Down, Depressed, Hopeless 0 0 0  PHQ - 2 Score 0 0 1  Altered sleeping 0 3 3  Tired, decreased energy 0 1 3  Change in appetite 0 0 0  Feeling bad or failure about yourself  0 0 0  Trouble concentrating 0 1 0  Moving slowly or fidgety/restless 0 0 0  Suicidal thoughts 0 0 0  PHQ-9 Score 0 5 7  Some recent data might be hidden    Lab Results  Component Value Date   WBC 16.0 (H) 03/03/2016   HGB 18.0 (H) 03/03/2016   HCT 52.8 (H) 03/03/2016   PLT 224 03/03/2016   GLUCOSE 86 09/02/2017   CHOL 160 04/22/2017   TRIG 307 (H) 04/22/2017   HDL 22 (L) 04/22/2017   LDLDIRECT 62.0 12/13/2014   LDLCALC 77 04/22/2017   ALT 30 04/01/2017   AST 21 04/01/2017   NA 139 09/02/2017   K 4.8 09/02/2017   CL 105 09/02/2017   CREATININE 1.49 09/02/2017   BUN 24 (H) 09/02/2017   CO2 26 09/02/2017   TSH 0.63 09/02/2017   PSA 0.54 07/01/2017   INR 3.2 (A) 11/25/2017   HGBA1C 5.9 09/02/2017   MICROALBUR 6.5 (H) 04/01/2017    No results found.  Assessment & Plan:   Devyon was seen today for follow-up.  Diagnoses and all orders for this visit:  Depression, major, single episode, severe ([place]) -     buPROPion (WELLBUTRIN) 75 MG tablet; Take one each morning with prozac for one week. Then take a second tab mid afternoon. -     Ambulatory referral to Psychiatry   I have discontinued [name]. Amick's Suvorexant. I am also having him start on buPROPion. Additionally, I am having him maintain his acetaminophen, aspirin EC, loratadine, glucose blood, ONE TOUCH ULTRA MINI, ONE TOUCH SURESOFT, Vitamin D (Ergocalciferol), amLODipine, MELATONIN GUMMIES PO,  omega-3 acid ethyl esters, metoprolol tartrate, levothyroxine, warfarin, metFORMIN, and FLUoxetine.  Meds ordered this encounter  Medications  . buPROPion (WELLBUTRIN) 75 MG tablet    Sig: Take one each morning with prozac for one week. Then take a second tab mid afternoon.    Dispense:  60 tablet    Refill:  1   When asked to fill out the PHQ 9 patient put all zeros.  No challenges him about his response he told me that he thought that is what  I wanted to hear.  Will pass his PHQ 9 results on to [name].  We will go ahead and add bupropion for augmentation.  Have referred for psychiatric consult.  Follow-up: Return in about 1 month (around 01/08/2018).  Libby Maw, MD

## 2017-12-10 DIAGNOSIS — G4733 Obstructive sleep apnea (adult) (pediatric): Secondary | ICD-10-CM | POA: Diagnosis not present

## 2017-12-14 ENCOUNTER — Ambulatory Visit: Payer: PPO | Admitting: Psychology

## 2017-12-14 DIAGNOSIS — F321 Major depressive disorder, single episode, moderate: Secondary | ICD-10-CM

## 2017-12-19 ENCOUNTER — Other Ambulatory Visit: Payer: Self-pay | Admitting: Cardiology

## 2017-12-29 ENCOUNTER — Ambulatory Visit (INDEPENDENT_AMBULATORY_CARE_PROVIDER_SITE_OTHER): Payer: PPO | Admitting: Psychology

## 2017-12-29 DIAGNOSIS — F321 Major depressive disorder, single episode, moderate: Secondary | ICD-10-CM | POA: Diagnosis not present

## 2018-01-03 ENCOUNTER — Encounter: Payer: Self-pay | Admitting: Family Medicine

## 2018-01-04 ENCOUNTER — Ambulatory Visit: Payer: PPO | Admitting: Psychology

## 2018-01-04 ENCOUNTER — Other Ambulatory Visit: Payer: Self-pay | Admitting: Physician Assistant

## 2018-01-04 DIAGNOSIS — F321 Major depressive disorder, single episode, moderate: Secondary | ICD-10-CM

## 2018-01-06 ENCOUNTER — Ambulatory Visit: Payer: PPO | Admitting: *Deleted

## 2018-01-06 DIAGNOSIS — Z5181 Encounter for therapeutic drug level monitoring: Secondary | ICD-10-CM | POA: Diagnosis not present

## 2018-01-06 DIAGNOSIS — I050 Rheumatic mitral stenosis: Secondary | ICD-10-CM

## 2018-01-06 DIAGNOSIS — I4891 Unspecified atrial fibrillation: Secondary | ICD-10-CM

## 2018-01-06 DIAGNOSIS — H43811 Vitreous degeneration, right eye: Secondary | ICD-10-CM | POA: Diagnosis not present

## 2018-01-06 DIAGNOSIS — I5033 Acute on chronic diastolic (congestive) heart failure: Secondary | ICD-10-CM | POA: Diagnosis not present

## 2018-01-06 DIAGNOSIS — Z0000 Encounter for general adult medical examination without abnormal findings: Secondary | ICD-10-CM

## 2018-01-06 DIAGNOSIS — H43822 Vitreomacular adhesion, left eye: Secondary | ICD-10-CM | POA: Diagnosis not present

## 2018-01-06 DIAGNOSIS — Z954 Presence of other heart-valve replacement: Secondary | ICD-10-CM

## 2018-01-06 DIAGNOSIS — H35371 Puckering of macula, right eye: Secondary | ICD-10-CM | POA: Diagnosis not present

## 2018-01-06 DIAGNOSIS — H2512 Age-related nuclear cataract, left eye: Secondary | ICD-10-CM | POA: Diagnosis not present

## 2018-01-06 LAB — HM DIABETES EYE EXAM

## 2018-01-06 LAB — POCT INR: INR: 1.5 — AB (ref 2.0–3.0)

## 2018-01-06 NOTE — Patient Instructions (Signed)
Description   Today take 2 tablets and tomorrow take 1.5 tablets then continue on same dosage 1 tablet daily except take 1.5  tablets Tuesdays, Thursdays, and Saturdays.  Recheck in 1 week.  Call [phone] with any questions.

## 2018-01-10 ENCOUNTER — Encounter: Payer: Self-pay | Admitting: Family Medicine

## 2018-01-10 ENCOUNTER — Ambulatory Visit (INDEPENDENT_AMBULATORY_CARE_PROVIDER_SITE_OTHER): Payer: PPO | Admitting: Family Medicine

## 2018-01-10 ENCOUNTER — Ambulatory Visit: Payer: PPO | Admitting: Psychology

## 2018-01-10 VITALS — BP 130/80 | HR 85 | Ht 68.0 in | Wt 204.1 lb

## 2018-01-10 DIAGNOSIS — N183 Chronic kidney disease, stage 3 (moderate): Secondary | ICD-10-CM

## 2018-01-10 DIAGNOSIS — N182 Chronic kidney disease, stage 2 (mild): Secondary | ICD-10-CM | POA: Diagnosis not present

## 2018-01-10 DIAGNOSIS — F322 Major depressive disorder, single episode, severe without psychotic features: Secondary | ICD-10-CM | POA: Diagnosis not present

## 2018-01-10 DIAGNOSIS — Z23 Encounter for immunization: Secondary | ICD-10-CM

## 2018-01-10 DIAGNOSIS — E1122 Type 2 diabetes mellitus with diabetic chronic kidney disease: Secondary | ICD-10-CM | POA: Diagnosis not present

## 2018-01-10 LAB — COMPREHENSIVE METABOLIC PANEL
ALT: 64 U/L — ABNORMAL HIGH (ref 0–53)
AST: 36 U/L (ref 0–37)
Albumin: 4.3 g/dL (ref 3.5–5.2)
Alkaline Phosphatase: 64 U/L (ref 39–117)
BUN: 20 mg/dL (ref 6–23)
CO2: 23 mEq/L (ref 19–32)
CREATININE: 1.66 mg/dL — AB (ref 0.40–1.50)
Calcium: 10.7 mg/dL — ABNORMAL HIGH (ref 8.4–10.5)
Chloride: 107 mEq/L (ref 96–112)
GFR: 44.46 mL/min — ABNORMAL LOW (ref >60.00)
GLUCOSE: 148 mg/dL — AB (ref 70–99)
POTASSIUM: 4.3 meq/L (ref 3.5–5.1)
SODIUM: 138 meq/L (ref 135–145)
TOTAL PROTEIN: 7.2 g/dL (ref 6.0–8.3)
Total Bilirubin: 0.5 mg/dL (ref 0.2–1.2)

## 2018-01-10 LAB — CBC
HEMATOCRIT: 47.8 % (ref 39.0–52.0)
HEMOGLOBIN: 16.3 g/dL (ref 13.0–17.0)
MCHC: 34.2 g/dL (ref 30.0–36.0)
MCV: 95.1 fl (ref 78.0–100.0)
Platelets: 244 10*3/uL (ref 150.0–400.0)
RBC: 5.03 Mil/uL (ref 4.22–5.81)
RDW: 15 % (ref 11.5–15.5)
WBC: 10.5 10*3/uL (ref 4.0–10.5)

## 2018-01-10 LAB — HEMOGLOBIN A1C: HEMOGLOBIN A1C: 6 % (ref 4.6–6.5)

## 2018-01-10 MED ORDER — FLUOXETINE HCL 20 MG PO TABS: 20.0000 mg | ORAL_TABLET | Freq: Every day | ORAL | 1 refills | Status: DC

## 2018-01-10 MED ORDER — BUPROPION HCL 75 MG PO TABS: ORAL_TABLET | ORAL | 2 refills | Status: DC

## 2018-01-10 NOTE — Progress Notes (Signed)
Subjective:  Patient ID: Kevin English, male    DOB: [birth year]  Age: 62 y.o. MRN: [id number]  CC: Follow-up   HPI Kevin English presents for follow-up of his depression.  He feels as though the Wellbutrin augmentation is helping.  His wife agrees.  He continues to see [name] for talking therapy as well.  Status post eye check for diabetes a couple of weeks ago.  Ophthalmology was wondering about his last hemoglobin A1c.  Diabetes is under better control with the Glucophage and he is tolerating the drug well.  Outpatient Medications Prior to Visit  Medication Sig Dispense Refill  . acetaminophen (TYLENOL) 325 MG tablet Take 325 mg by mouth every 6 (six) hours as needed (pain).     Marland Kitchen amLODipine (NORVASC) 10 MG tablet TAKE 1 TABLET (10 MG TOTAL) BY MOUTH DAILY. 90 tablet 2  . aspirin EC 81 MG tablet Take 1 tablet (81 mg total) by mouth daily.    . Blood Glucose Monitoring Suppl (ONE TOUCH ULTRA MINI) w/Device KIT Use meter to check blood sugars 1-4 times daily as instructed. 1 each 0  . glucose blood (ONE TOUCH ULTRA TEST) test strip Use one strip per test. Test blood sugars 1-4 times daily as instructed. 100 each 12  . Lancets Misc. (ONE TOUCH SURESOFT) MISC Use 1 lancet per test. Test blood sugars 1-4 times per day as instructed. 1 each 1  . levothyroxine (SYNTHROID, LEVOTHROID) 75 MCG tablet Take 1 tablet (75 mcg total) by mouth daily before breakfast. 90 tablet 2  . loratadine (CLARITIN) 10 MG tablet Take 1 tablet (10 mg total) by mouth daily.    Marland Kitchen MELATONIN GUMMIES PO Take by mouth. Pt states taking 2 tablets 20 mg at night but making him more sleepy so taking 1 at night    . metFORMIN (GLUCOPHAGE) 500 MG tablet TAKE 1 TABLET (500 MG TOTAL) BY MOUTH 2 (TWO) TIMES DAILY WITH A MEAL. 60 tablet 1  . metoprolol tartrate (LOPRESSOR) 25 MG tablet Take 0.5 tablets (12.5 mg total) by mouth 2 (two) times daily. 180 tablet 3  . omega-3 acid ethyl esters (LOVAZA) 1 g capsule TAKE 2 CAPSULES (2 G  TOTAL) BY MOUTH 2 (TWO) TIMES DAILY. 360 capsule 1  . Vitamin D, Ergocalciferol, (DRISDOL) 50000 units CAPS capsule Take 1 capsule (50,000 Units total) by mouth every 7 (seven) days. 30 capsule 1  . warfarin (COUMADIN) 5 MG tablet TAKE AS DIRECTED BY ANTICOAGULATION CLINIC 40 tablet 3  . buPROPion (WELLBUTRIN) 75 MG tablet Take one each morning with prozac for one week. Then take a second tab mid afternoon. 60 tablet 1  . FLUoxetine (PROZAC) 20 MG tablet TAKE 1 TABLET BY MOUTH EVERY DAY 90 tablet 1   No facility-administered medications prior to visit.     ROS Review of Systems  Constitutional: Negative.   Eyes: Negative for photophobia and visual disturbance.  Respiratory: Negative.   Cardiovascular: Negative.   Gastrointestinal: Negative.   Skin: Negative for pallor and rash.  Neurological: Negative.   Hematological: Does not bruise/bleed easily.  Psychiatric/Behavioral: Negative.     Objective:  BP 130/80   Pulse 85   Ht 5' 8"  (1.727 m)   Wt 204 lb 2 oz (92.6 kg)   SpO2 98%   BMI 31.04 kg/m   BP Readings from Last 3 Encounters:  01/10/18 130/80  12/09/17 130/80  11/08/17 128/80    Wt Readings from Last 3 Encounters:  01/10/18 204 lb 2 oz (  92.6 kg)  12/09/17 203 lb 6 oz (92.3 kg)  11/08/17 197 lb (89.4 kg)    Physical Exam  Constitutional: He is oriented to person, place, and time. He appears well-developed and well-nourished. No distress.  HENT:  Head: Normocephalic and atraumatic.  Right Ear: External ear normal.  Left Ear: External ear normal.  Eyes: Pupils are equal, round, and reactive to light. Conjunctivae and EOM are normal. Right eye exhibits no discharge. No scleral icterus.  Neck: No JVD present. No tracheal deviation present.  Pulmonary/Chest: Effort normal.  Neurological: He is alert and oriented to person, place, and time.  Skin: He is not diaphoretic.  Psychiatric: He has a normal mood and affect. His behavior is normal.    Lab Results    Component Value Date   WBC 16.0 (H) 03/03/2016   HGB 18.0 (H) 03/03/2016   HCT 52.8 (H) 03/03/2016   PLT 224 03/03/2016   GLUCOSE 86 09/02/2017   CHOL 160 04/22/2017   TRIG 307 (H) 04/22/2017   HDL 22 (L) 04/22/2017   LDLDIRECT 62.0 12/13/2014   LDLCALC 77 04/22/2017   ALT 30 04/01/2017   AST 21 04/01/2017   NA 139 09/02/2017   K 4.8 09/02/2017   CL 105 09/02/2017   CREATININE 1.49 09/02/2017   BUN 24 (H) 09/02/2017   CO2 26 09/02/2017   TSH 0.63 09/02/2017   PSA 0.54 07/01/2017   INR 1.5 (A) 01/06/2018   HGBA1C 5.9 09/02/2017   MICROALBUR 6.5 (H) 04/01/2017    No results found.  Assessment & Plan:   Kevin English was seen today for follow-up.  Diagnoses and all orders for this visit:  Controlled type 2 diabetes mellitus with stage 3 chronic kidney disease, without long-term current use of insulin ([facility]) -     Hemoglobin A1c -     Comprehensive metabolic panel -     CBC  Need for influenza vaccination -     Flu Vaccine QUAD 36+ mos IM  CKD (chronic kidney disease), stage II -     Comprehensive metabolic panel -     CBC  Depression, major, single episode, severe ([facility]) -     buPROPion (WELLBUTRIN) 75 MG tablet; Take one each morning with prozac for one week. Then take a second tab mid afternoon. -     FLUoxetine (PROZAC) 20 MG tablet; Take 1 tablet (20 mg total) by mouth daily.   I have changed Kevin English. Kevin English's FLUoxetine. I am also having him maintain his acetaminophen, aspirin EC, loratadine, glucose blood, ONE TOUCH ULTRA MINI, ONE TOUCH SURESOFT, Vitamin D (Ergocalciferol), MELATONIN GUMMIES PO, metoprolol tartrate, levothyroxine, warfarin, metFORMIN, omega-3 acid ethyl esters, amLODipine, and buPROPion.  Meds ordered this encounter  Medications  . buPROPion (WELLBUTRIN) 75 MG tablet    Sig: Take one each morning with prozac for one week. Then take a second tab mid afternoon.    Dispense:  60 tablet    Refill:  2  . FLUoxetine (PROZAC) 20 MG tablet    Sig:  Take 1 tablet (20 mg total) by mouth daily.    Dispense:  90 tablet    Refill:  1   Patient will continue Prozac with Wellbutrin augmentation.  Talking therapy with [name].  We will follow-up in 2 months.  Follow-up: Return in about 2 months (around 03/12/2018).  Libby Maw, MD

## 2018-01-11 DIAGNOSIS — N183 Chronic kidney disease, stage 3 (moderate): Secondary | ICD-10-CM | POA: Diagnosis not present

## 2018-01-12 ENCOUNTER — Ambulatory Visit (INDEPENDENT_AMBULATORY_CARE_PROVIDER_SITE_OTHER): Payer: PPO | Admitting: *Deleted

## 2018-01-12 ENCOUNTER — Telehealth: Payer: Self-pay

## 2018-01-12 DIAGNOSIS — I442 Atrioventricular block, complete: Secondary | ICD-10-CM

## 2018-01-12 DIAGNOSIS — I10 Essential (primary) hypertension: Secondary | ICD-10-CM

## 2018-01-12 NOTE — Progress Notes (Signed)
Remote pacemaker transmission.   

## 2018-01-12 NOTE — Telephone Encounter (Signed)
Spoke with pt and reminded pt of remote transmission that is due today. Pt verbalized understanding.   

## 2018-01-13 ENCOUNTER — Ambulatory Visit: Payer: PPO | Admitting: *Deleted

## 2018-01-13 DIAGNOSIS — I4891 Unspecified atrial fibrillation: Secondary | ICD-10-CM

## 2018-01-13 DIAGNOSIS — I050 Rheumatic mitral stenosis: Secondary | ICD-10-CM

## 2018-01-13 DIAGNOSIS — Z5181 Encounter for therapeutic drug level monitoring: Secondary | ICD-10-CM

## 2018-01-13 DIAGNOSIS — I5033 Acute on chronic diastolic (congestive) heart failure: Secondary | ICD-10-CM

## 2018-01-13 DIAGNOSIS — Z0000 Encounter for general adult medical examination without abnormal findings: Secondary | ICD-10-CM | POA: Diagnosis not present

## 2018-01-13 DIAGNOSIS — Z954 Presence of other heart-valve replacement: Secondary | ICD-10-CM

## 2018-01-13 LAB — POCT INR: INR: 2.9 (ref 2.0–3.0)

## 2018-01-13 NOTE — Patient Instructions (Signed)
Description   Continue on same dosage 1 tablet daily except take 1.5 tablets Tuesdays, Thursdays, and Saturdays.  Recheck in 4 weeks.  Call [phone] with any questions.

## 2018-01-15 ENCOUNTER — Other Ambulatory Visit: Payer: Self-pay | Admitting: Family Medicine

## 2018-01-15 DIAGNOSIS — E1122 Type 2 diabetes mellitus with diabetic chronic kidney disease: Secondary | ICD-10-CM

## 2018-01-15 DIAGNOSIS — N183 Chronic kidney disease, stage 3 (moderate): Principal | ICD-10-CM

## 2018-01-24 ENCOUNTER — Other Ambulatory Visit: Payer: Self-pay | Admitting: Cardiology

## 2018-01-25 ENCOUNTER — Other Ambulatory Visit: Payer: Self-pay | Admitting: Family Medicine

## 2018-01-25 DIAGNOSIS — F322 Major depressive disorder, single episode, severe without psychotic features: Secondary | ICD-10-CM

## 2018-01-26 ENCOUNTER — Ambulatory Visit (INDEPENDENT_AMBULATORY_CARE_PROVIDER_SITE_OTHER): Payer: PPO | Admitting: Psychology

## 2018-01-26 DIAGNOSIS — F321 Major depressive disorder, single episode, moderate: Secondary | ICD-10-CM

## 2018-02-03 LAB — CUP PACEART REMOTE DEVICE CHECK
Battery Impedance: 183 Ohm
Brady Statistic AP VP Percent: 0 %
Brady Statistic AP VS Percent: 53 %
Brady Statistic AS VP Percent: 0 %
Brady Statistic AS VS Percent: 47 %
Date Time Interrogation Session: 20191023152224
Implantable Lead Implant Date: 20160301
Implantable Lead Model: 5076
Implantable Lead Model: 5076
Lead Channel Impedance Value: 449 Ohm
Lead Channel Impedance Value: 674 Ohm
Lead Channel Sensing Intrinsic Amplitude: 11.2 mV
Lead Channel Sensing Intrinsic Amplitude: 2.8 mV
Lead Channel Setting Pacing Amplitude: 2 V
Lead Channel Setting Pacing Pulse Width: 0.4 ms
MDC IDC LEAD IMPLANT DT: 20160301
MDC IDC LEAD LOCATION: 753859
MDC IDC LEAD LOCATION: 753860
MDC IDC MSMT BATTERY REMAINING LONGEVITY: 129 mo
MDC IDC MSMT BATTERY VOLTAGE: 2.78 V
MDC IDC MSMT LEADCHNL RA PACING THRESHOLD AMPLITUDE: 0.5 V
MDC IDC MSMT LEADCHNL RA PACING THRESHOLD PULSEWIDTH: 0.4 ms
MDC IDC MSMT LEADCHNL RV PACING THRESHOLD AMPLITUDE: 0.625 V
MDC IDC MSMT LEADCHNL RV PACING THRESHOLD PULSEWIDTH: 0.4 ms
MDC IDC PG IMPLANT DT: 20160301
MDC IDC SET LEADCHNL RV PACING AMPLITUDE: 2.5 V
MDC IDC SET LEADCHNL RV SENSING SENSITIVITY: 5.6 mV

## 2018-02-08 ENCOUNTER — Ambulatory Visit: Payer: PPO | Admitting: Pharmacist

## 2018-02-08 DIAGNOSIS — I4891 Unspecified atrial fibrillation: Secondary | ICD-10-CM

## 2018-02-08 DIAGNOSIS — I5033 Acute on chronic diastolic (congestive) heart failure: Secondary | ICD-10-CM

## 2018-02-08 DIAGNOSIS — Z0000 Encounter for general adult medical examination without abnormal findings: Secondary | ICD-10-CM | POA: Diagnosis not present

## 2018-02-08 DIAGNOSIS — Z5181 Encounter for therapeutic drug level monitoring: Secondary | ICD-10-CM | POA: Diagnosis not present

## 2018-02-08 DIAGNOSIS — I050 Rheumatic mitral stenosis: Secondary | ICD-10-CM | POA: Diagnosis not present

## 2018-02-08 DIAGNOSIS — Z954 Presence of other heart-valve replacement: Secondary | ICD-10-CM

## 2018-02-08 LAB — POCT INR: INR: 2.2 (ref 2.0–3.0)

## 2018-02-08 NOTE — Patient Instructions (Addendum)
Description   Take 1.5 tablets on Tuesday, Wednesday, and Thursday, then continue on same dosage 1 tablet daily except take 1.5 tablets Tuesdays, Thursdays, and Saturdays.  Recheck in 3 weeks.  Call [phone] with any questions.

## 2018-03-01 ENCOUNTER — Ambulatory Visit: Payer: PPO

## 2018-03-01 DIAGNOSIS — I050 Rheumatic mitral stenosis: Secondary | ICD-10-CM | POA: Diagnosis not present

## 2018-03-01 DIAGNOSIS — Z954 Presence of other heart-valve replacement: Secondary | ICD-10-CM

## 2018-03-01 DIAGNOSIS — I4891 Unspecified atrial fibrillation: Secondary | ICD-10-CM | POA: Diagnosis not present

## 2018-03-01 DIAGNOSIS — Z0000 Encounter for general adult medical examination without abnormal findings: Secondary | ICD-10-CM | POA: Diagnosis not present

## 2018-03-01 DIAGNOSIS — Z5181 Encounter for therapeutic drug level monitoring: Secondary | ICD-10-CM | POA: Diagnosis not present

## 2018-03-01 DIAGNOSIS — I5033 Acute on chronic diastolic (congestive) heart failure: Secondary | ICD-10-CM | POA: Diagnosis not present

## 2018-03-01 LAB — POCT INR: INR: 2.1 (ref 2.0–3.0)

## 2018-03-01 NOTE — Patient Instructions (Addendum)
Description   Take 2 tablets today, then start taking 1.5 tablets daily except 1 tablet on Monday, Wednesday, and Friday. Recheck in 3 weeks.  Call [phone] with any questions.

## 2018-03-09 ENCOUNTER — Ambulatory Visit (INDEPENDENT_AMBULATORY_CARE_PROVIDER_SITE_OTHER): Payer: PPO | Admitting: Family Medicine

## 2018-03-09 ENCOUNTER — Encounter: Payer: Self-pay | Admitting: Family Medicine

## 2018-03-09 VITALS — BP 128/80 | HR 88 | Ht 68.0 in | Wt 213.2 lb

## 2018-03-09 DIAGNOSIS — E559 Vitamin D deficiency, unspecified: Secondary | ICD-10-CM

## 2018-03-09 DIAGNOSIS — N182 Chronic kidney disease, stage 2 (mild): Secondary | ICD-10-CM

## 2018-03-09 DIAGNOSIS — F322 Major depressive disorder, single episode, severe without psychotic features: Secondary | ICD-10-CM | POA: Diagnosis not present

## 2018-03-09 DIAGNOSIS — E039 Hypothyroidism, unspecified: Secondary | ICD-10-CM | POA: Diagnosis not present

## 2018-03-09 LAB — COMPREHENSIVE METABOLIC PANEL
ALT: 25 U/L (ref 0–53)
AST: 23 U/L (ref 0–37)
Albumin: 4.5 g/dL (ref 3.5–5.2)
Alkaline Phosphatase: 46 U/L (ref 39–117)
BILIRUBIN TOTAL: 0.5 mg/dL (ref 0.2–1.2)
BUN: 18 mg/dL (ref 6–23)
CHLORIDE: 102 meq/L (ref 96–112)
CO2: 24 meq/L (ref 19–32)
Calcium: 9.5 mg/dL (ref 8.4–10.5)
Creatinine, Ser: 0.81 mg/dL (ref 0.40–1.50)
GFR: 101.71 mL/min (ref >60.00)
GLUCOSE: 95 mg/dL (ref 70–99)
Potassium: 4.1 mEq/L (ref 3.5–5.1)
Sodium: 139 mEq/L (ref 135–145)
Total Protein: 7.3 g/dL (ref 6.0–8.3)

## 2018-03-09 LAB — CBC
HCT: 47.9 % (ref 39.0–52.0)
HEMOGLOBIN: 16.1 g/dL (ref 13.0–17.0)
MCHC: 33.7 g/dL (ref 30.0–36.0)
MCV: 95.8 fl (ref 78.0–100.0)
Platelets: 248 10*3/uL (ref 150.0–400.0)
RBC: 5 Mil/uL (ref 4.22–5.81)
RDW: 15.6 % — AB (ref 11.5–15.5)
WBC: 11.7 10*3/uL — ABNORMAL HIGH (ref 4.0–10.5)

## 2018-03-09 LAB — TSH: TSH: 2.03 u[IU]/mL (ref 0.35–4.50)

## 2018-03-09 LAB — VITAMIN D 25 HYDROXY (VIT D DEFICIENCY, FRACTURES): VITD: 62.4 ng/mL (ref 30.00–100.00)

## 2018-03-09 MED ORDER — BUPROPION HCL 75 MG PO TABS: ORAL_TABLET | ORAL | 2 refills | Status: DC

## 2018-03-09 MED ORDER — FLUOXETINE HCL 20 MG PO TABS: 20.0000 mg | ORAL_TABLET | Freq: Every day | ORAL | 1 refills | Status: DC

## 2018-03-09 NOTE — Progress Notes (Addendum)
Established Patient Office Visit  Subjective:  Patient ID: Kevin English, male    DOB: 12/16/1955  Age: 62 y.o. MRN: [id number]  CC:  Chief Complaint  Patient presents with  . Follow-up    HPI Kevin English presents for follow-up of his depression, hypothyroidism chronic kidney disease and vitamin D deficiency.  Mood level has been much improved with the addition of the Wellbutrin.  He is having no issues taking it.  He has gained some weight since starting the Wellbutrin but I assured him that neither 1 of the drugs he is taking for depression is associated with weight gain.  He can continue liver rather sedentary lifestyle.  He is not exercising other than using his treadmill on occasion.  He is walking outside especially going down inclines.  He feels unsteady on his feet.  He denies dizziness or lightheadedness at this time.  He does admit to loss of muscle mass.  He is taking his thyroid pill in the morning an hour before eating.  He continues vitamin D supplementation.  Reminds me that he has 1 kidney.  Expressed my concern about the fall and his GR are 2 months ago.  He encouraged hydration.  Past Medical History:  Diagnosis Date  . Anxiety   . Borderline diabetes   . CAD (coronary artery disease)    a. By cath 08/2013: 70% distal cx-prox LPDA; tandem mod LAD lesions, o/w mild dz.  . CKD (chronic kidney disease), stage II    his creat. runs 2-2.5; hasnt seen neph yet - sees dr. Tresa Moore at [facility]  . Complete heart block (HCC)    a. s/p PPM   . Deafness in left ear   . Depression   . Diabetes mellitus without complication ([place])   . Diverticulitis of colon 15 years ago  . Hernia   . HTN (hypertension)   . Hyperlipidemia   . Hypogonadism male   . Hypothyroidism   . Lung nodule seen on imaging study 04/10/2014   5 mm nodule right lower lobe  . Mitral stenosis    a. Dx 07/2013 - through workup of 2D echo, TEE, and cath, felt to be moderate.  . Obesity   . OSA on CPAP   .  Persistent atrial fibrillation    a. sustained 160-180 on e-CARDIO monitor placed 07/29/2013. Placed on IV amiodarone at end of May 2015 due to continued paroxysms with RVR.  Marland Kitchen Renal cell carcinoma of right kidney ([place]) 09/05/2013   clear cell renal cell carcinoma of right kidney treated by radical nephrectomy, Fuhrman grade 2-3, with maximum tumor diameter 2.7 cm, all tumor to find confined to the kidney, and all surgical margins negative (T1aN0).   . Renal mass    a. Dx 08/2013: concerning for renal cancer.  . Rheumatic heart disease   . S/P Minimally invasive maze operation for atrial fibrillation 05/15/2014   Left side lesion set using cryothermy with clipping of LA appendage  . S/P minimally invasive mitral valve replacement with metallic valve and maze procedure 05/15/2014   45m Sorin Carbomedics Optiform mechanical valve placed via right mini thoracotomy approach  . Stroke ([facility] 1998   "MRI showed 3 mild strokes"    Past Surgical History:  Procedure Laterality Date  . EYE SURGERY Right 1990   "reconstructive, lens implant- from paintball accident"  . INGUINAL HERNIA REPAIR Bilateral first one in 80's   "I had one side done twice"  . LEFT AND RIGHT HEART CATHETERIZATION  WITH CORONARY ANGIOGRAM N/A 08/28/2013   Procedure: LEFT AND RIGHT HEART CATHETERIZATION WITH CORONARY ANGIOGRAM;  Surgeon: Leonie Man, MD;  Location: [facility] CATH LAB;  Service: Cardiovascular;  Laterality: N/A;  . MINIMALLY INVASIVE MAZE PROCEDURE N/A 05/15/2014   Procedure: MINIMALLY INVASIVE MAZE PROCEDURE;  Surgeon: Rexene Alberts, MD;  Location: [place];  Service: Open Heart Surgery;  Laterality: N/A;  . MITRAL VALVE REPLACEMENT Right 05/15/2014   Procedure: MINIMALLY INVASIVE MITRAL VALVE (MV) REPLACEMENT;  Surgeon: Rexene Alberts, MD;  Location: [place];  Service: Open Heart Surgery;  Laterality: Right;  . PERMANENT PACEMAKER INSERTION Left 05/22/2014   a. MDT dual chamber PPM implanted by Dr Rayann Heman for CHB s/p MAZE/MVR    . ROBOT ASSISTED LAPAROSCOPIC NEPHRECTOMY Right 09/05/2013   Procedure: ROBOTIC ASSISTED LAPAROSCOPIC RIGHT RADICAL NEPHRECTOMY;  Surgeon: Sharyn Creamer, MD;  Location: [facility];  Service: Urology;  Laterality: Right;  . TEE WITHOUT CARDIOVERSION N/A 08/16/2013   Procedure: TRANSESOPHAGEAL ECHOCARDIOGRAM (TEE);  Surgeon: Dorothy Spark, MD;  Location: [place];  Service: Cardiovascular;  Laterality: N/A;  . TEE WITHOUT CARDIOVERSION N/A 05/15/2014   Procedure: TRANSESOPHAGEAL ECHOCARDIOGRAM (TEE);  Surgeon: Rexene Alberts, MD;  Location: [place];  Service: Open Heart Surgery;  Laterality: N/A;    Family History  Problem Relation Age of Onset  . Hypertension Mother   . Heart attack Mother   . Stroke Mother   . Hypertension Unknown     Social History   Socioeconomic History  . Marital status: Married    Spouse name: Not on file  . Number of children: 2  . Years of education: 91  . Highest education level: Not on file  Occupational History  . Occupation: Retired   Scientific laboratory technician  . Financial resource strain: Not on file  . Food insecurity:    Worry: Not on file    Inability: Not on file  . Transportation needs:    Medical: Not on file    Non-medical: Not on file  Tobacco Use  . Smoking status: Current Some Day Smoker    Packs/day: 0.25    Years: 40.00    Pack years: 10.00    Types: Cigarettes, Cigars    Last attempt to quit: 03/02/2016    Years since quitting: 2.0  . Smokeless tobacco: Never Used  . Tobacco comment: Cigar every now and then  Substance and Sexual Activity  . Alcohol use: No    Alcohol/week: 0.0 standard drinks  . Drug use: No  . Sexual activity: Not Currently  Lifestyle  . Physical activity:    Days per week: Not on file    Minutes per session: Not on file  . Stress: Not on file  Relationships  . Social connections:    Talks on phone: Not on file    Gets together: Not on file    Attends religious service: Not on file    Active member of  club or organization: Not on file    Attends meetings of clubs or organizations: Not on file    Relationship status: Not on file  . Intimate partner violence:    Fear of current or ex partner: Not on file    Emotionally abused: Not on file    Physically abused: Not on file    Forced sexual activity: Not on file  Other Topics Concern  . Not on file  Social History Narrative   Fun: [employer] people.     Outpatient Medications Prior to  Visit  Medication Sig Dispense Refill  . acetaminophen (TYLENOL) 325 MG tablet Take 325 mg by mouth every 6 (six) hours as needed (pain).     Marland Kitchen amLODipine (NORVASC) 10 MG tablet TAKE 1 TABLET (10 MG TOTAL) BY MOUTH DAILY. 90 tablet 2  . aspirin EC 81 MG tablet Take 1 tablet (81 mg total) by mouth daily.    . Blood Glucose Monitoring Suppl (ONE TOUCH ULTRA MINI) w/Device KIT Use meter to check blood sugars 1-4 times daily as instructed. 1 each 0  . glucose blood (ONE TOUCH ULTRA TEST) test strip Use one strip per test. Test blood sugars 1-4 times daily as instructed. 100 each 12  . Lancets Misc. (ONE TOUCH SURESOFT) MISC Use 1 lancet per test. Test blood sugars 1-4 times per day as instructed. 1 each 1  . loratadine (CLARITIN) 10 MG tablet Take 1 tablet (10 mg total) by mouth daily.    Marland Kitchen MELATONIN GUMMIES PO Take by mouth. Pt states taking 2 tablets 20 mg at night but making him more sleepy so taking 1 at night    . metFORMIN (GLUCOPHAGE) 500 MG tablet TAKE 1 TABLET (500 MG TOTAL) BY MOUTH 2 (TWO) TIMES DAILY WITH A MEAL. 60 tablet 5  . metoprolol tartrate (LOPRESSOR) 25 MG tablet Take 0.5 tablets (12.5 mg total) by mouth 2 (two) times daily. 180 tablet 3  . omega-3 acid ethyl esters (LOVAZA) 1 g capsule TAKE 2 CAPSULES (2 G TOTAL) BY MOUTH 2 (TWO) TIMES DAILY. 360 capsule 1  . warfarin (COUMADIN) 5 MG tablet TAKE AS DIRECTED BY ANTICOAGULATION CLINIC 120 tablet 1  . buPROPion (WELLBUTRIN) 75 MG tablet PLEASE SEE ATTACHED FOR DETAILED DIRECTIONS 60 tablet 1  .  FLUoxetine (PROZAC) 20 MG tablet Take 1 tablet (20 mg total) by mouth daily. 90 tablet 1  . levothyroxine (SYNTHROID, LEVOTHROID) 75 MCG tablet Take 1 tablet (75 mcg total) by mouth daily before breakfast. 90 tablet 2  . Vitamin D, Ergocalciferol, (DRISDOL) 50000 units CAPS capsule Take 1 capsule (50,000 Units total) by mouth every 7 (seven) days. 30 capsule 1   No facility-administered medications prior to visit.     Allergies  Allergen Reactions  . Contrast Media [Iodinated Diagnostic Agents] Hives and Other (See Comments)    Patient started sneezing and coughing, patient broke out in hives, patient needs 13 hour prep if having IV contrast  . Ioxaglate Hives and Rash    Patient started sneezing and coughing, patient broke out in hives, patient needs 13 hour prep if having IV contrast  . Atorvastatin Other (See Comments)    Myalgias  . Crestor [Rosuvastatin] Other (See Comments)    Myalgias  . Fenofibrate Other (See Comments)    MYALGIAS   . Pravastatin Other (See Comments)    Myalgias   . Pseudoephedrine Hcl Palpitations    ROS Review of Systems  Constitutional: Negative.   HENT: Negative.   Respiratory: Negative.   Cardiovascular: Negative.   Gastrointestinal: Negative.   Genitourinary: Negative.   Musculoskeletal: Negative for arthralgias and myalgias.  Skin: Negative for pallor and rash.  Neurological: Negative for seizures, speech difficulty, light-headedness, numbness and headaches.  Hematological: Does not bruise/bleed easily.  Psychiatric/Behavioral: Positive for dysphoric mood.      Objective:    Physical Exam  Constitutional: He is oriented to person, place, and time. He appears well-developed and well-nourished. No distress.  HENT:  Head: Normocephalic and atraumatic.  Right Ear: External ear normal.  Left Ear: External  ear normal.  Eyes: Conjunctivae are normal. Right eye exhibits no discharge. Left eye exhibits no discharge. No scleral icterus.    Neck: No JVD present. No tracheal deviation present.  Pulmonary/Chest: Effort normal. No stridor.  Neurological: He is alert and oriented to person, place, and time.  Skin: Skin is warm and dry. He is not diaphoretic.  Psychiatric: He has a normal mood and affect. His behavior is normal.    BP 128/80   Pulse 88   Ht _0  (1.727 m)   Wt 213 lb 4 oz (96.7 kg)   SpO2 100%   BMI 32.42 kg/m  Wt Readings from Last 3 Encounters:  03/09/18 213 lb 4 oz (96.7 kg)  01/10/18 204 lb 2 oz (92.6 kg)  12/09/17 203 lb 6 oz (92.3 kg)   BP Readings from Last 3 Encounters:  03/09/18 128/80  01/10/18 130/80  12/09/17 130/80   Guideline developer:  UpToDate (see UpToDate for funding source) Date Released: June 2014  Health Maintenance Due  Topic Date Due  . COLONOSCOPY  05/18/2003  . TETANUS/TDAP  03/23/2014  . FOOT EXAM  10/22/2017    There are no preventive care reminders to display for this patient.  Lab Results  Component Value Date   TSH 2.03 03/09/2018   Lab Results  Component Value Date   WBC 11.7 (H) 03/09/2018   HGB 16.1 03/09/2018   HCT 47.9 03/09/2018   MCV 95.8 03/09/2018   PLT 248.0 03/09/2018   Lab Results  Component Value Date   NA 139 03/09/2018   K 4.1 03/09/2018   CHLORIDE 109 01/02/2016   CO2 24 03/09/2018   GLUCOSE 95 03/09/2018   BUN 18 03/09/2018   CREATININE 0.81 03/09/2018   BILITOT 0.5 03/09/2018   ALKPHOS 46 03/09/2018   AST 23 03/09/2018   ALT 25 03/09/2018   PROT 7.3 03/09/2018   ALBUMIN 4.5 03/09/2018   CALCIUM 9.5 03/09/2018   ANIONGAP 8 03/03/2016   EGFR 49 (L) 01/02/2016   GFR 101.71 03/09/2018   Lab Results  Component Value Date   CHOL 160 04/22/2017   Lab Results  Component Value Date   HDL 22 (L) 04/22/2017   Lab Results  Component Value Date   LDLCALC 77 04/22/2017   Lab Results  Component Value Date   TRIG 307 (H) 04/22/2017   Lab Results  Component Value Date   CHOLHDL 7.3 (H) 04/22/2017   Lab Results   Component Value Date   HGBA1C 6.0 01/10/2018      Assessment & Plan:   Problem List Items Addressed This Visit      Endocrine   Hypothyroidism (acquired) - Primary (Chronic)   Relevant Medications   levothyroxine (SYNTHROID, LEVOTHROID) 75 MCG tablet   Other Relevant Orders   TSH (Completed)     Genitourinary   CKD (chronic kidney disease), stage II (Chronic)   Relevant Orders   CBC (Completed)   Comprehensive metabolic panel (Completed)     Other   Vitamin D deficiency   Relevant Orders   VITAMIN D 25 Hydroxy (Vit-D Deficiency, Fractures) (Completed)   Depression, major, single episode, severe (HCC)   Relevant Medications   buPROPion (WELLBUTRIN) 75 MG tablet   FLUoxetine (PROZAC) 20 MG tablet      Meds ordered this encounter  Medications  . buPROPion (WELLBUTRIN) 75 MG tablet    Sig: Take each morning and a second pill mid afternoon.    Dispense:  60 tablet    Refill:  2  . FLUoxetine (PROZAC) 20 MG tablet    Sig: Take 1 tablet (20 mg total) by mouth daily.    Dispense:  90 tablet    Refill:  1  . levothyroxine (SYNTHROID, LEVOTHROID) 75 MCG tablet    Sig: Take 1 tablet (75 mcg total) by mouth daily before breakfast.    Dispense:  90 tablet    Refill:  2    Follow-up: Return in about 3 months (around 06/08/2018).   Encouraged the patient to use his treadmill for 30 minutes at least 5 days a week to maintain his strength.  [facility] to [facility] an [facility] class.  You he has an appointment with [name] in January.  Follow-up is 3 months.  Encouraged hydration to maintain GFR.

## 2018-03-11 MED ORDER — LEVOTHYROXINE SODIUM 75 MCG PO TABS: 75.0000 ug | ORAL_TABLET | Freq: Every day | ORAL | 2 refills | Status: DC

## 2018-03-11 NOTE — Addendum Note (Signed)
Addended by: Jon Billings on: 03/11/2018 09:37 AM   Modules accepted: Orders

## 2018-03-22 ENCOUNTER — Ambulatory Visit: Payer: PPO | Admitting: *Deleted

## 2018-03-22 DIAGNOSIS — Z0000 Encounter for general adult medical examination without abnormal findings: Secondary | ICD-10-CM

## 2018-03-22 DIAGNOSIS — Z5181 Encounter for therapeutic drug level monitoring: Secondary | ICD-10-CM

## 2018-03-22 DIAGNOSIS — I4891 Unspecified atrial fibrillation: Secondary | ICD-10-CM

## 2018-03-22 DIAGNOSIS — Z954 Presence of other heart-valve replacement: Secondary | ICD-10-CM

## 2018-03-22 DIAGNOSIS — I050 Rheumatic mitral stenosis: Secondary | ICD-10-CM

## 2018-03-22 DIAGNOSIS — I5033 Acute on chronic diastolic (congestive) heart failure: Secondary | ICD-10-CM | POA: Diagnosis not present

## 2018-03-22 LAB — POCT INR: INR: 3.1 — AB (ref 2.0–3.0)

## 2018-03-22 NOTE — Patient Instructions (Signed)
Description   Continue taaking 1.5 tablets daily except 1 tablet on Monday, Wednesday, and Friday. Recheck in 3 weeks.  Call [phone] with any questions.

## 2018-03-31 ENCOUNTER — Encounter: Payer: Self-pay | Admitting: Cardiology

## 2018-03-31 ENCOUNTER — Ambulatory Visit: Payer: PPO | Admitting: Cardiology

## 2018-03-31 VITALS — BP 148/86 | HR 77 | Ht 68.0 in | Wt 216.4 lb

## 2018-03-31 DIAGNOSIS — E782 Mixed hyperlipidemia: Secondary | ICD-10-CM | POA: Diagnosis not present

## 2018-03-31 DIAGNOSIS — I2583 Coronary atherosclerosis due to lipid rich plaque: Secondary | ICD-10-CM | POA: Diagnosis not present

## 2018-03-31 DIAGNOSIS — Z954 Presence of other heart-valve replacement: Secondary | ICD-10-CM | POA: Diagnosis not present

## 2018-03-31 DIAGNOSIS — I10 Essential (primary) hypertension: Secondary | ICD-10-CM | POA: Diagnosis not present

## 2018-03-31 DIAGNOSIS — I2510 Atherosclerotic heart disease of native coronary artery without angina pectoris: Secondary | ICD-10-CM | POA: Diagnosis not present

## 2018-03-31 NOTE — Patient Instructions (Signed)
Medication Instructions:   Your physician recommends that you continue on your current medications as directed. Please refer to the Current Medication list given to you today.  If you need a refill on your cardiac medications before your next appointment, please call your pharmacy.     Testing/Procedures:  Your physician has requested that you have an echocardiogram. Echocardiography is a painless test that uses sound waves to create images of your heart. It provides your doctor with information about the size and shape of your heart and how well your heart's chambers and valves are working. This procedure takes approximately one hour. There are no restrictions for this procedure.     Follow-Up: At [facility], you and your health needs are our priority.  As part of our continuing mission to provide you with exceptional heart care, we have created designated Provider Care Teams.  These Care Teams include your primary Cardiologist (physician) and Advanced Practice Providers (APPs -  Physician Assistants and Nurse Practitioners) who all work together to provide you with the care you need, when you need it. You will need a follow up appointment in 6 months.  Please call our office 2 months in advance to schedule this appointment.  You may see Katarina Nelson, MD or one of the following Advanced Practice Providers on your designated Care Team:   Brittainy Simmons, PA-C Dayna Dunn, PA-C . Michele Lenze, PA-C    

## 2018-03-31 NOTE — Progress Notes (Signed)
Cardiology Office Note:    Date:  03/31/2018   ID:  Kevin English, DOB Jan 22, 1956, MRN [id number]  PCP:  Libby Maw, MD  Cardiologist:  Dr. Ena Dawley   Electrophysiologist:  Dr. Thompson Grayer  Nephrologist: Dr. Marval Regal  Chief complain: 6 months follow-up  History of Present Illness:     Kevin English is a 63 y.o. male with a hx of HTN, HL (intol to statins), former smoker, PAFib, Rheumatic heart disease with severe mitral stenosis, diastolic HF, depression, CKD, renal cell CA, OSA. He has been on Coumadin. He had been prepared for minimally invasive MVR and placed on Amiodarone. However, he developed clear cell renal cell CA and underwent R nephrectomy. He developed fatigue with beta-blocker and Bystolic was DC'd. He was ultimately stabilized and underwent Minimally-Invasive Mitral Valve Replacement (Sorin Carbomedics Optiform bileaflet mechanical valve) and Maze Procedure with Left atrial lesion set using cryothermy and Clipping of Left Atrial Appendage with Dr. Roxy Manns in 2/16. Post op course was complicated by complete heart block. He ultimately required implantation of a Medtronic Adapta L dual-chamber pacemaker.   03/04/2016 - the patient has been dealing with progressively worsening depression, he meets with his friends but has no interest or joy in life, his children or family life. He states he doesn't care about his heart health because he doesn't feel well. On Tuesday, 03/03/2016 he was at [employer] with his friends when he walked to the bathroom and felt that he is legs were weak and went to the ground and couldn't get up. He denies any chest pain palpitation or dizziness at the time. An ambulance was called and he was taken to the ER. In the ER his labs were normal, and head CT showed no acute changes. He now denies any weakness. He was seen by neurologist yesterday who thinks that this is related to progressive severe depression but order nerve conduction  studies and electromyography study. Today he denies any chest pain and worsening shortness of breath he has been compliant with his warfarin today's INR 2.3. No bleeding. No palpitations. No lower extremity edema orthopnea or personal nocturnal dyspnea.  06/08/2016 - this is 3 months follow-up, the patient is doing well from cardiac standpoint he denies any chest pain, shortness of breath, no lower extremity edema or claudications. He also hasn't experienced any palpitations or syncope. He has been compliant with his medications and has no side effects including no bleeding with warfarin. His most significant problem is depression that prevents him from going outside most days, he refuses to do any exercises and is not motivated. He has seen a therapist but increased Zoloft dose to 100 mg a day. He was also seen by neurologist for muscle weakness with so far negative workup.  01/04/2017, this is a months follow-up, the patient seems to be doing well she still seemed depressed and not motivated, doesn't exercise, leaves house infrequently, however he was recently diagnosed with diabetes and has lost 15 pounds. He denies any chest pain shortness of breath, orthopnea or nocturnal dyspnea no lower extremity edema palpitations dizziness or syncope. No cough. Has been compliant with his medication, his INR is always at goal and has no bleeding on Coumadin.  09/02/2017 -this is 6 months follow-up, the patient has been doing well cardiac wise he denies any chest pain has a stable dyspnea on exertion, has no lower extremity edema no palpitations dizziness or syncope.  He is biggest problem is severe depression for which he has  seen psychiatrist with no improvement, currently followed by his primary care physician Dr. Alfonso Ramus, he literally spends his day in bed, does not feel like getting out feels like he has no energy, as a result he cannot sleep at night and feels tired the next day.  He does not go to gym does not  have any hobbies.  03/31/2018 -this is 6 months follow-up, the patient denies any chest pain but is feeling more and more weak now having difficulties walking stairs.  He stays inside of the house most of days does not exercise walk, his wife states that he has been excessively eating unhealthy food.  He takes his medications and has no side effects.  No bleeding.  He denies any orthopnea proximal nocturnal dyspnea no lower extremity edema.   Past Medical History:  Diagnosis Date  . Anxiety   . Borderline diabetes   . CAD (coronary artery disease)    a. By cath 08/2013: 70% distal cx-prox LPDA; tandem mod LAD lesions, o/w mild dz.  . CKD (chronic kidney disease), stage II    his creat. runs 2-2.5; hasnt seen neph yet - sees dr. Tresa Moore at [facility]  . Complete heart block (HCC)    a. s/p PPM   . Deafness in left ear   . Depression   . Diabetes mellitus without complication ([place])   . Diverticulitis of colon 15 years ago  . Hernia   . HTN (hypertension)   . Hyperlipidemia   . Hypogonadism male   . Hypothyroidism   . Lung nodule seen on imaging study 04/10/2014   5 mm nodule right lower lobe  . Mitral stenosis    a. Dx 07/2013 - through workup of 2D echo, TEE, and cath, felt to be moderate.  . Obesity   . OSA on CPAP   . Persistent atrial fibrillation    a. sustained 160-180 on e-CARDIO monitor placed 07/29/2013. Placed on IV amiodarone at end of May 2015 due to continued paroxysms with RVR.  Marland Kitchen Renal cell carcinoma of right kidney ([place]) 09/05/2013   clear cell renal cell carcinoma of right kidney treated by radical nephrectomy, Fuhrman grade 2-3, with maximum tumor diameter 2.7 cm, all tumor to find confined to the kidney, and all surgical margins negative (T1aN0).   . Renal mass    a. Dx 08/2013: concerning for renal cancer.  . Rheumatic heart disease   . S/P Minimally invasive maze operation for atrial fibrillation 05/15/2014   Left side lesion set using cryothermy with clipping of LA  appendage  . S/P minimally invasive mitral valve replacement with metallic valve and maze procedure 05/15/2014   65m Sorin Carbomedics Optiform mechanical valve placed via right mini thoracotomy approach  . Stroke ([facility] 1998   "MRI showed 3 mild strokes"    Past Surgical History:  Procedure Laterality Date  . EYE SURGERY Right 1990   "reconstructive, lens implant- from paintball accident"  . INGUINAL HERNIA REPAIR Bilateral first one in 80's   "I had one side done twice"  . LEFT AND RIGHT HEART CATHETERIZATION WITH CORONARY ANGIOGRAM N/A 08/28/2013   Procedure: LEFT AND RIGHT HEART CATHETERIZATION WITH CORONARY ANGIOGRAM;  Surgeon: DLeonie Man MD;  [facility];  Service: Cardiovascular;  Laterality: N/A;  . MINIMALLY INVASIVE MAZE PROCEDURE N/A 05/15/2014   Procedure: MINIMALLY INVASIVE MAZE PROCEDURE;  Surgeon: CRexene Alberts MD;  [place]  Service: Open Heart Surgery;  Laterality: N/A;  . MITRAL VALVE REPLACEMENT Right 05/15/2014  Procedure: MINIMALLY INVASIVE MITRAL VALVE (MV) REPLACEMENT;  Surgeon: Rexene Alberts, MD;  Location: [place];  Service: Open Heart Surgery;  Laterality: Right;  . PERMANENT PACEMAKER INSERTION Left 05/22/2014   a. MDT dual chamber PPM implanted by Dr Rayann Heman for CHB s/p MAZE/MVR  . ROBOT ASSISTED LAPAROSCOPIC NEPHRECTOMY Right 09/05/2013   Procedure: ROBOTIC ASSISTED LAPAROSCOPIC RIGHT RADICAL NEPHRECTOMY;  Surgeon: Sharyn Creamer, MD;  Location: [facility];  Service: Urology;  Laterality: Right;  . TEE WITHOUT CARDIOVERSION N/A 08/16/2013   Procedure: TRANSESOPHAGEAL ECHOCARDIOGRAM (TEE);  Surgeon: Dorothy Spark, MD;  Location: [place];  Service: Cardiovascular;  Laterality: N/A;  . TEE WITHOUT CARDIOVERSION N/A 05/15/2014   Procedure: TRANSESOPHAGEAL ECHOCARDIOGRAM (TEE);  Surgeon: Rexene Alberts, MD;  Location: [place];  Service: Open Heart Surgery;  Laterality: N/A;    Current Medications: Outpatient Medications Prior to Visit    Medication Sig Dispense Refill  . acetaminophen (TYLENOL) 325 MG tablet Take 325 mg by mouth every 6 (six) hours as needed (pain).     Marland Kitchen amLODipine (NORVASC) 10 MG tablet TAKE 1 TABLET (10 MG TOTAL) BY MOUTH DAILY. 90 tablet 2  . aspirin EC 81 MG tablet Take 1 tablet (81 mg total) by mouth daily.    . Blood Glucose Monitoring Suppl (ONE TOUCH ULTRA MINI) w/Device KIT Use meter to check blood sugars 1-4 times daily as instructed. 1 each 0  . buPROPion (WELLBUTRIN) 75 MG tablet Take each morning and a second pill mid afternoon. 60 tablet 2  . FLUoxetine (PROZAC) 20 MG tablet Take 1 tablet (20 mg total) by mouth daily. 90 tablet 1  . glucose blood (ONE TOUCH ULTRA TEST) test strip Use one strip per test. Test blood sugars 1-4 times daily as instructed. 100 each 12  . Lancets Misc. (ONE TOUCH SURESOFT) MISC Use 1 lancet per test. Test blood sugars 1-4 times per day as instructed. 1 each 1  . levothyroxine (SYNTHROID, LEVOTHROID) 75 MCG tablet Take 1 tablet (75 mcg total) by mouth daily before breakfast. 90 tablet 2  . loratadine (CLARITIN) 10 MG tablet Take 1 tablet (10 mg total) by mouth daily.    Marland Kitchen MELATONIN GUMMIES PO Take by mouth. Pt states taking 2 tablets 20 mg at night but making him more sleepy so taking 1 at night    . metFORMIN (GLUCOPHAGE) 500 MG tablet TAKE 1 TABLET (500 MG TOTAL) BY MOUTH 2 (TWO) TIMES DAILY WITH A MEAL. 60 tablet 5  . metoprolol tartrate (LOPRESSOR) 25 MG tablet Take 0.5 tablets (12.5 mg total) by mouth 2 (two) times daily. 180 tablet 3  . omega-3 acid ethyl esters (LOVAZA) 1 g capsule TAKE 2 CAPSULES (2 G TOTAL) BY MOUTH 2 (TWO) TIMES DAILY. 360 capsule 1  . warfarin (COUMADIN) 5 MG tablet TAKE AS DIRECTED BY ANTICOAGULATION CLINIC 120 tablet 1   No facility-administered medications prior to visit.      Allergies:   Contrast media [iodinated diagnostic agents]; Ioxaglate; Atorvastatin; Crestor [rosuvastatin]; Fenofibrate; Pravastatin; and Pseudoephedrine hcl    Social History   Socioeconomic History  . Marital status: Married    Spouse name: Not on file  . Number of children: 2  . Years of education: 54  . Highest education level: Not on file  Occupational History  . Occupation: Retired   Scientific laboratory technician  . Financial resource strain: Not on file  . Food insecurity:    Worry: Not on file    Inability: Not on file  .  Transportation needs:    Medical: Not on file    Non-medical: Not on file  Tobacco Use  . Smoking status: Current Some Day Smoker    Packs/day: 0.25    Years: 40.00    Pack years: 10.00    Types: Cigarettes, Cigars    Last attempt to quit: 03/02/2016    Years since quitting: 2.0  . Smokeless tobacco: Never Used  . Tobacco comment: Cigar every now and then  Substance and Sexual Activity  . Alcohol use: No    Alcohol/week: 0.0 standard drinks  . Drug use: No  . Sexual activity: Not Currently  Lifestyle  . Physical activity:    Days per week: Not on file    Minutes per session: Not on file  . Stress: Not on file  Relationships  . Social connections:    Talks on phone: Not on file    Gets together: Not on file    Attends religious service: Not on file    Active member of club or organization: Not on file    Attends meetings of clubs or organizations: Not on file    Relationship status: Not on file  Other Topics Concern  . Not on file  Social History Narrative   Fun: Aggravate people.      Family History:  The patient's family history includes Heart attack in his mother; Hypertension in his mother and another family member; Stroke in his mother.   ROS:   Please see the history of present illness.    ROS All other systems reviewed and are negative.   Physical Exam:    VS:  BP (!) 148/86   Pulse 77   Ht _0  (1.727 m)   Wt 216 lb 6.4 oz (98.2 kg)   BMI 32.90 kg/m    GEN: Well nourished, well developed, in no acute distress  HEENT: normal  Neck: no JVD, no masses Cardiac: mechanical S1, normal S2,  RRR; no murmurs, rubs, or gallops, no edema;  Respiratory:  clear to auscultation bilaterally; no wheezing, rhonchi or rales GI: soft, nontender, nondistended MS: no deformity or atrophy  Skin: warm and dry Neuro: No focal deficits  Psych: Alert and oriented x 3, normal affect  Wt Readings from Last 3 Encounters:  03/31/18 216 lb 6.4 oz (98.2 kg)  03/09/18 213 lb 4 oz (96.7 kg)  01/10/18 204 lb 2 oz (92.6 kg)      Studies/Labs Reviewed:     EKG:  EKG is  ordered today.  The ekg ordered today demonstrates Normal sinus rhythm with right bundle branch block and left anterior fascicular block, unchanged from prior. This was personally reviewed.   Recent Labs: 03/09/2018: ALT 25; BUN 18; Creatinine, Ser 0.81; Hemoglobin 16.1; Platelets 248.0; Potassium 4.1; Sodium 139; TSH 2.03  Labs at nephrologists office 06/13/15: BUN 17, creatinine 1.50, potassium 5, ALT 25  Recent Lipid Panel    Component Value Date/Time   CHOL 160 04/22/2017 0852   TRIG 307 (H) 04/22/2017 0852   HDL 22 (L) 04/22/2017 0852   CHOLHDL 7.3 (H) 04/22/2017 0852   CHOLHDL 8.4 (H) 03/19/2015 0756   VLDL 54 (H) 03/19/2015 0756   LDLCALC 77 04/22/2017 0852   LDLDIRECT 62.0 12/13/2014 0845   Additional studies/ records that were reviewed today include:   Echo 7/16 Mild LVH, EF 50-55%, normal wall motion, trivial AI, mechanical MVR with normal function, mean gradient 4 mmHg, mild to moderate LAE, normal RV function  Echo 4/16 Mild  LVH, EF 45-50%, apical dyskinesis, trivial AI, mechanical MVR okay, severe LAE  Carotid US 04/16/14 Bilateral - 1% to 39% ICA stensosis.  TEE 09/08/48 Normal systolic function, normal wall motion, mild AI, moderate LAE Impressions: Rheumatic mitral valve with moderate leaflet tip thickening and minimal calcifications. Moderate mitral stenosis and trace mitral regurgitation.  Cardiac cath 08/28/13 LM: Normal LAD: Proximal 40-50%, distal 50-60% LCx: Small OM 70-80% (not amenable to  PCI or CABG), PDA 70% LPDA: 10-20% RCA: Mid 30-40%   ASSESSMENT:     1. S/P mitral valve replacement with metallic valve   2. Coronary artery disease due to lipid rich plaque   3. Essential hypertension   4. Mixed hyperlipidemia     PLAN:     In order of problems listed above:  1. S/p Mechanical MVR - Echo in 2/18 with normal LV function, well functioning mechanical MVR, normal transmitral gradients. Continue Coumadin.  Last INR 3.1.  Continue SBE prophylaxis. No bleeding on Coumadin.  We will repeat an echocardiogram.  2. PAF - Maintaining NSR. 0% atrial fibrillation on pacemaker interrogation in 04/2017. Continue Coumadin. Bradycardia resolved with decreased dose of metoprolol, I will further decrease to 12.5 mg twice daily as this might be contributing to his depression.  3. HL - Continue current Rx Lovaza and Zetia, statin intolerant.  4. Essential hypertension -controlled.  5. CAD - No angina.  Continue ASA, beta-blocker.  6. OSA - Continue CPAP.   7.  Weakness - most probably related to depression and absolute physical inactivity.  Again he is strongly advised to go to gym 3 times a week even if moving for 10 minutes a day.  He is not sure he can commit to that.  His wife will need to help him to motivate him and get him to exercise.    Medication Adjustments/Labs and Tests Ordered: Current medicines are reviewed at length with the patient today.  Concerns regarding medicines are outlined above.  Medication changes, Labs and Tests ordered today are outlined in the Patient Instructions noted below. Patient Instructions  Medication Instructions:   Your physician recommends that you continue on your current medications as directed. Please refer to the Current Medication list given to you today.  If you need a refill on your cardiac medications before your next appointment, please call your pharmacy.     Testing/Procedures:  Your physician has requested that you have an  echocardiogram. Echocardiography is a painless test that uses sound waves to create images of your heart. It provides your doctor with information about the size and shape of your heart and how well your heart's chambers and valves are working. This procedure takes approximately one hour. There are no restrictions for this procedure.    Follow-Up: At [facility], you and your health needs are our priority.  As part of our continuing mission to provide you with exceptional heart care, we have created designated Provider Care Teams.  These Care Teams include your primary Cardiologist (physician) and Advanced Practice Providers (APPs -  Physician Assistants and Nurse Practitioners) who all work together to provide you with the care you need, when you need it. You will need a follow up appointment in 6 months.  Please call our office 2 months in advance to schedule this appointment.  You may see Ena Dawley, MD or one of the following Advanced Practice Providers on your designated Care Team:   [place], PA-C Melina Copa, PA-C . Ermalinda Barrios, PA-C  Signed, Ena Dawley, MD  03/31/2018 11:39 AM    [address], [place], [place]  [address] Phone: [phone]; Fax: [phone]

## 2018-03-31 NOTE — Addendum Note (Signed)
Addended by: Patterson Hammersmith A on: 03/31/2018 03:27 PM   Modules accepted: Orders

## 2018-04-03 ENCOUNTER — Encounter

## 2018-04-07 ENCOUNTER — Other Ambulatory Visit: Payer: Self-pay

## 2018-04-07 ENCOUNTER — Ambulatory Visit (HOSPITAL_COMMUNITY): Payer: PPO | Attending: Cardiovascular Disease

## 2018-04-07 ENCOUNTER — Telehealth: Payer: Self-pay | Admitting: *Deleted

## 2018-04-07 DIAGNOSIS — I2583 Coronary atherosclerosis due to lipid rich plaque: Secondary | ICD-10-CM | POA: Insufficient documentation

## 2018-04-07 DIAGNOSIS — Z954 Presence of other heart-valve replacement: Secondary | ICD-10-CM | POA: Diagnosis not present

## 2018-04-07 DIAGNOSIS — E782 Mixed hyperlipidemia: Secondary | ICD-10-CM | POA: Insufficient documentation

## 2018-04-07 DIAGNOSIS — I2510 Atherosclerotic heart disease of native coronary artery without angina pectoris: Secondary | ICD-10-CM | POA: Diagnosis not present

## 2018-04-07 DIAGNOSIS — I10 Essential (primary) hypertension: Secondary | ICD-10-CM | POA: Diagnosis not present

## 2018-04-07 NOTE — Telephone Encounter (Signed)
Called the pts wife and informed her of his echo results per Dr [name]. When talking to the pts wife she stated that he had a fall over a week ago, and got a black eye.  Wife states she never took him to the ER or his PCP, for he was doing fine.  Wife states he is still doing fine, and has no altered LOC.  Wife did state that the pt is falling due to not being able to ambulate as well as he use to.  Wife states she thinks he has an underlying issue like Parkinson's.  Wife states that she will be getting him in to see his PCP tomorrow, to further evaluate these complaint.  Wife just wanted to give Dr [name] an update about this matter.  Informed the pts wife that I will pass this message along to Dr [name], as an Juluis Rainier.  Advised the pts wife that anytime someone has a fall, hits their head and is on coumadin, they should more than likely be seen by PCP or ER.  Advised the wife to monitor the pt until he see's his PCP.  Wife verbalized understanding and agrees with this plan. Wife states she will update Dr [name] and myself on how his PCP appt went.

## 2018-04-07 NOTE — Telephone Encounter (Signed)
-----   Message from Dorothy Spark, MD sent at 04/07/2018  4:02 PM EST ----- Normal LVEF, normal trans-mitral velocities across the mechanical valve.

## 2018-04-08 ENCOUNTER — Ambulatory Visit (INDEPENDENT_AMBULATORY_CARE_PROVIDER_SITE_OTHER): Payer: PPO | Admitting: Family Medicine

## 2018-04-08 ENCOUNTER — Encounter: Payer: Self-pay | Admitting: Neurology

## 2018-04-08 ENCOUNTER — Encounter: Payer: Self-pay | Admitting: Family Medicine

## 2018-04-08 VITALS — BP 124/78 | HR 81 | Ht 68.0 in | Wt 212.2 lb

## 2018-04-08 DIAGNOSIS — R252 Cramp and spasm: Secondary | ICD-10-CM

## 2018-04-08 DIAGNOSIS — R269 Unspecified abnormalities of gait and mobility: Secondary | ICD-10-CM

## 2018-04-08 DIAGNOSIS — W19XXXA Unspecified fall, initial encounter: Secondary | ICD-10-CM

## 2018-04-08 DIAGNOSIS — G4733 Obstructive sleep apnea (adult) (pediatric): Secondary | ICD-10-CM | POA: Diagnosis not present

## 2018-04-08 LAB — MAGNESIUM: Magnesium: 2.3 mg/dL (ref 1.5–2.5)

## 2018-04-08 MED ORDER — GABAPENTIN 400 MG PO CAPS: 400.0000 mg | ORAL_CAPSULE | Freq: Every day | ORAL | 2 refills | Status: DC

## 2018-04-08 NOTE — Progress Notes (Signed)
Established Patient Office Visit  Subjective:  Patient ID: Kevin English, male    DOB: 05-04-1955  Age: 63 y.o. MRN: [id number]  CC:  Chief Complaint  Patient presents with  . left leg jumps    HPI Kevin English presents for discussion about jerking that he has been experiencing in his left leg.  This is also bothered him in the past.  He had seen the folks at low [facility] neurology about this issue.  Nerve conduction studies were recommended but he was unable to go for the testing.  More recently he is felt unsteady with his gait.  He actually fell last week.  He denied feeling lightheaded or dizzy prior to the fall.  There were no palpitations.  However, he does feel lightheaded at times.  He is also experiencing cramping in his lower extremities at night.  They sometimes affect his sleep.  He does have a history of apnea and is compliant with his machine.  His wife has not noted that the patient kicks the covers off at night but she does sleep in a different bed.  Patient's wife is concerned about his diabetes.  She has not been checking his blood sugars.  His last hemoglobin globin A1c was 6.  Past Medical History:  Diagnosis Date  . Anxiety   . Borderline diabetes   . CAD (coronary artery disease)    a. By cath 08/2013: 70% distal cx-prox LPDA; tandem mod LAD lesions, o/w mild dz.  . CKD (chronic kidney disease), stage II    his creat. runs 2-2.5; hasnt seen neph yet - sees dr. Tresa Moore at [facility]  . Complete heart block (HCC)    a. s/p PPM   . Deafness in left ear   . Depression   . Diabetes mellitus without complication ([place])   . Diverticulitis of colon 15 years ago  . Hernia   . HTN (hypertension)   . Hyperlipidemia   . Hypogonadism male   . Hypothyroidism   . Lung nodule seen on imaging study 04/10/2014   5 mm nodule right lower lobe  . Mitral stenosis    a. Dx 07/2013 - through workup of 2D echo, TEE, and cath, felt to be moderate.  . Obesity   . OSA on CPAP   .  Persistent atrial fibrillation    a. sustained 160-180 on e-CARDIO monitor placed 07/29/2013. Placed on IV amiodarone at end of May 2015 due to continued paroxysms with RVR.  Marland Kitchen Renal cell carcinoma of right kidney ([place]) 09/05/2013   clear cell renal cell carcinoma of right kidney treated by radical nephrectomy, Fuhrman grade 2-3, with maximum tumor diameter 2.7 cm, all tumor to find confined to the kidney, and all surgical margins negative (T1aN0).   . Renal mass    a. Dx 08/2013: concerning for renal cancer.  . Rheumatic heart disease   . S/P Minimally invasive maze operation for atrial fibrillation 05/15/2014   Left side lesion set using cryothermy with clipping of LA appendage  . S/P minimally invasive mitral valve replacement with metallic valve and maze procedure 05/15/2014   72m Sorin Carbomedics Optiform mechanical valve placed via right mini thoracotomy approach  . Stroke ([facility] 1998   "MRI showed 3 mild strokes"    Past Surgical History:  Procedure Laterality Date  . EYE SURGERY Right 1990   "reconstructive, lens implant- from paintball accident"  . INGUINAL HERNIA REPAIR Bilateral first one in 80's   "I had one side done  twice"  . LEFT AND RIGHT HEART CATHETERIZATION WITH CORONARY ANGIOGRAM N/A 08/28/2013   Procedure: LEFT AND RIGHT HEART CATHETERIZATION WITH CORONARY ANGIOGRAM;  Surgeon: Leonie Man, MD;  Location: [facility] CATH LAB;  Service: Cardiovascular;  Laterality: N/A;  . MINIMALLY INVASIVE MAZE PROCEDURE N/A 05/15/2014   Procedure: MINIMALLY INVASIVE MAZE PROCEDURE;  Surgeon: Rexene Alberts, MD;  Location: [place];  Service: Open Heart Surgery;  Laterality: N/A;  . MITRAL VALVE REPLACEMENT Right 05/15/2014   Procedure: MINIMALLY INVASIVE MITRAL VALVE (MV) REPLACEMENT;  Surgeon: Rexene Alberts, MD;  Location: [place];  Service: Open Heart Surgery;  Laterality: Right;  . PERMANENT PACEMAKER INSERTION Left 05/22/2014   a. MDT dual chamber PPM implanted by Dr Rayann Heman for CHB s/p MAZE/MVR    . ROBOT ASSISTED LAPAROSCOPIC NEPHRECTOMY Right 09/05/2013   Procedure: ROBOTIC ASSISTED LAPAROSCOPIC RIGHT RADICAL NEPHRECTOMY;  Surgeon: Sharyn Creamer, MD;  Location: [facility];  Service: Urology;  Laterality: Right;  . TEE WITHOUT CARDIOVERSION N/A 08/16/2013   Procedure: TRANSESOPHAGEAL ECHOCARDIOGRAM (TEE);  Surgeon: Dorothy Spark, MD;  Location: [place];  Service: Cardiovascular;  Laterality: N/A;  . TEE WITHOUT CARDIOVERSION N/A 05/15/2014   Procedure: TRANSESOPHAGEAL ECHOCARDIOGRAM (TEE);  Surgeon: Rexene Alberts, MD;  Location: [place];  Service: Open Heart Surgery;  Laterality: N/A;    Family History  Problem Relation Age of Onset  . Hypertension Mother   . Heart attack Mother   . Stroke Mother   . Hypertension Other     Social History   Socioeconomic History  . Marital status: Married    Spouse name: Not on file  . Number of children: 2  . Years of education: 30  . Highest education level: Not on file  Occupational History  . Occupation: Retired   Scientific laboratory technician  . Financial resource strain: Not on file  . Food insecurity:    Worry: Not on file    Inability: Not on file  . Transportation needs:    Medical: Not on file    Non-medical: Not on file  Tobacco Use  . Smoking status: Current Some Day Smoker    Packs/day: 0.25    Years: 40.00    Pack years: 10.00    Types: Cigarettes, Cigars    Last attempt to quit: 03/02/2016    Years since quitting: 2.1  . Smokeless tobacco: Never Used  . Tobacco comment: Cigar every now and then  Substance and Sexual Activity  . Alcohol use: No    Alcohol/week: 0.0 standard drinks  . Drug use: No  . Sexual activity: Not Currently  Lifestyle  . Physical activity:    Days per week: Not on file    Minutes per session: Not on file  . Stress: Not on file  Relationships  . Social connections:    Talks on phone: Not on file    Gets together: Not on file    Attends religious service: Not on file    Active member of club  or organization: Not on file    Attends meetings of clubs or organizations: Not on file    Relationship status: Not on file  . Intimate partner violence:    Fear of current or ex partner: Not on file    Emotionally abused: Not on file    Physically abused: Not on file    Forced sexual activity: Not on file  Other Topics Concern  . Not on file  Social History Narrative   Fun: [employer] people.  Outpatient Medications Prior to Visit  Medication Sig Dispense Refill  . acetaminophen (TYLENOL) 325 MG tablet Take 325 mg by mouth every 6 (six) hours as needed (pain).     Marland Kitchen amLODipine (NORVASC) 10 MG tablet TAKE 1 TABLET (10 MG TOTAL) BY MOUTH DAILY. 90 tablet 2  . aspirin EC 81 MG tablet Take 1 tablet (81 mg total) by mouth daily.    . Blood Glucose Monitoring Suppl (ONE TOUCH ULTRA MINI) w/Device KIT Use meter to check blood sugars 1-4 times daily as instructed. 1 each 0  . buPROPion (WELLBUTRIN) 75 MG tablet Take each morning and a second pill mid afternoon. 60 tablet 2  . FLUoxetine (PROZAC) 20 MG tablet Take 1 tablet (20 mg total) by mouth daily. 90 tablet 1  . glucose blood (ONE TOUCH ULTRA TEST) test strip Use one strip per test. Test blood sugars 1-4 times daily as instructed. 100 each 12  . Lancets Misc. (ONE TOUCH SURESOFT) MISC Use 1 lancet per test. Test blood sugars 1-4 times per day as instructed. 1 each 1  . levothyroxine (SYNTHROID, LEVOTHROID) 75 MCG tablet Take 1 tablet (75 mcg total) by mouth daily before breakfast. 90 tablet 2  . loratadine (CLARITIN) 10 MG tablet Take 1 tablet (10 mg total) by mouth daily.    Marland Kitchen MELATONIN GUMMIES PO Take by mouth. Pt states taking 2 tablets 20 mg at night but making him more sleepy so taking 1 at night    . metFORMIN (GLUCOPHAGE) 500 MG tablet TAKE 1 TABLET (500 MG TOTAL) BY MOUTH 2 (TWO) TIMES DAILY WITH A MEAL. 60 tablet 5  . metoprolol tartrate (LOPRESSOR) 25 MG tablet Take 0.5 tablets (12.5 mg total) by mouth 2 (two) times daily. 180  tablet 3  . omega-3 acid ethyl esters (LOVAZA) 1 g capsule TAKE 2 CAPSULES (2 G TOTAL) BY MOUTH 2 (TWO) TIMES DAILY. 360 capsule 1  . warfarin (COUMADIN) 5 MG tablet TAKE AS DIRECTED BY ANTICOAGULATION CLINIC 120 tablet 1   No facility-administered medications prior to visit.     Allergies  Allergen Reactions  . Contrast Media [Iodinated Diagnostic Agents] Hives and Other (See Comments)    Patient started sneezing and coughing, patient broke out in hives, patient needs 13 hour prep if having IV contrast  . Ioxaglate Hives and Rash    Patient started sneezing and coughing, patient broke out in hives, patient needs 13 hour prep if having IV contrast  . Atorvastatin Other (See Comments)    Myalgias  . Crestor [Rosuvastatin] Other (See Comments)    Myalgias  . Fenofibrate Other (See Comments)    MYALGIAS   . Pravastatin Other (See Comments)    Myalgias   . Pseudoephedrine Hcl Palpitations    ROS Review of Systems  Constitutional: Negative for chills, diaphoresis, fever and unexpected weight change.  Eyes: Negative for photophobia and visual disturbance.  Respiratory: Negative.   Cardiovascular: Negative.   Gastrointestinal: Negative.   Endocrine: Negative for polyphagia and polyuria.  Genitourinary: Negative.   Musculoskeletal: Positive for gait problem and myalgias.  Allergic/Immunologic: Negative for immunocompromised state.  Neurological: Negative for seizures, speech difficulty and numbness.  Hematological: Does not bruise/bleed easily.  Psychiatric/Behavioral: Positive for dysphoric mood.      Objective:    Physical Exam  Constitutional: He is oriented to person, place, and time. He appears well-developed and well-nourished. No distress.  HENT:  Head: Normocephalic and atraumatic.  Right Ear: External ear normal.  Left Ear: External ear normal.  Eyes: Conjunctivae are normal. Right eye exhibits no discharge. Left eye exhibits no discharge. No scleral icterus.    Neck: Neck supple. No JVD present. No tracheal deviation present.  Pulmonary/Chest: Effort normal. No stridor.  Neurological: He is alert and oriented to person, place, and time.  Skin: Skin is warm and dry. Bruising noted. He is not diaphoretic.     Psychiatric: He has a normal mood and affect. His behavior is normal.    BP 124/78   Pulse 81   Ht _0  (1.727 m)   Wt 212 lb 4 oz (96.3 kg)   SpO2 98%   BMI 32.27 kg/m  Wt Readings from Last 3 Encounters:  04/08/18 212 lb 4 oz (96.3 kg)  03/31/18 216 lb 6.4 oz (98.2 kg)  03/09/18 213 lb 4 oz (96.7 kg)   BP Readings from Last 3 Encounters:  04/08/18 124/78  03/31/18 (!) 148/86  03/09/18 128/80   Guideline developer:  UpToDate (see UpToDate for funding source) Date Released: June 2014  Health Maintenance Due  Topic Date Due  . COLONOSCOPY  05/18/2003  . TETANUS/TDAP  03/23/2014  . FOOT EXAM  10/22/2017  . URINE MICROALBUMIN  04/01/2018    There are no preventive care reminders to display for this patient.  Lab Results  Component Value Date   TSH 2.03 03/09/2018   Lab Results  Component Value Date   WBC 11.7 (H) 03/09/2018   HGB 16.1 03/09/2018   HCT 47.9 03/09/2018   MCV 95.8 03/09/2018   PLT 248.0 03/09/2018   Lab Results  Component Value Date   NA 139 03/09/2018   K 4.1 03/09/2018   CHLORIDE 109 01/02/2016   CO2 24 03/09/2018   GLUCOSE 95 03/09/2018   BUN 18 03/09/2018   CREATININE 0.81 03/09/2018   BILITOT 0.5 03/09/2018   ALKPHOS 46 03/09/2018   AST 23 03/09/2018   ALT 25 03/09/2018   PROT 7.3 03/09/2018   ALBUMIN 4.5 03/09/2018   CALCIUM 9.5 03/09/2018   ANIONGAP 8 03/03/2016   EGFR 49 (L) 01/02/2016   GFR 101.71 03/09/2018   Lab Results  Component Value Date   CHOL 160 04/22/2017   Lab Results  Component Value Date   HDL 22 (L) 04/22/2017   Lab Results  Component Value Date   LDLCALC 77 04/22/2017   Lab Results  Component Value Date   TRIG 307 (H) 04/22/2017   Lab Results   Component Value Date   CHOLHDL 7.3 (H) 04/22/2017   Lab Results  Component Value Date   HGBA1C 6.0 01/10/2018      Assessment & Plan:   Problem List Items Addressed This Visit      Other   Gait disorder - Primary   Relevant Orders   Ambulatory referral to Neurology   Leg cramps   Relevant Medications   gabapentin (NEURONTIN) 400 MG capsule   Other Relevant Orders   Magnesium   Fall      Meds ordered this encounter  Medications  . gabapentin (NEURONTIN) 400 MG capsule    Sig: Take 1 capsule (400 mg total) by mouth at bedtime.    Dispense:  30 capsule    Refill:  2    Follow-up: Return as previously directed.

## 2018-04-11 ENCOUNTER — Telehealth: Payer: Self-pay | Admitting: Family Medicine

## 2018-04-11 NOTE — Telephone Encounter (Signed)
Copied from [place]. Topic: General - Other >> Apr 11, 2018  1:24 PM Kevin English wrote: Reason for CRM: pt was prescribed gabapentin 400 mg. Pt wife is calling her husband has one kidney and kidney function not up to par and see nephrologist. Pt wife would like to make should its ok for him to take gabapentin medication. Pt saw dr Ethelene Hal on 04-08-2018

## 2018-04-11 NOTE — Telephone Encounter (Signed)
Please advise on message below.

## 2018-04-12 ENCOUNTER — Ambulatory Visit (INDEPENDENT_AMBULATORY_CARE_PROVIDER_SITE_OTHER): Payer: PPO | Admitting: *Deleted

## 2018-04-12 DIAGNOSIS — Z954 Presence of other heart-valve replacement: Secondary | ICD-10-CM | POA: Diagnosis not present

## 2018-04-12 DIAGNOSIS — I050 Rheumatic mitral stenosis: Secondary | ICD-10-CM

## 2018-04-12 DIAGNOSIS — Z5181 Encounter for therapeutic drug level monitoring: Secondary | ICD-10-CM

## 2018-04-12 DIAGNOSIS — I5033 Acute on chronic diastolic (congestive) heart failure: Secondary | ICD-10-CM | POA: Diagnosis not present

## 2018-04-12 DIAGNOSIS — Z0000 Encounter for general adult medical examination without abnormal findings: Secondary | ICD-10-CM

## 2018-04-12 DIAGNOSIS — I4891 Unspecified atrial fibrillation: Secondary | ICD-10-CM

## 2018-04-12 LAB — POCT INR: INR: 4.2 — AB (ref 2.0–3.0)

## 2018-04-12 NOTE — Patient Instructions (Addendum)
Description   Do not take any Coumadin tonight then continue taking 1.5 tablets daily except 1 tablet on Monday, Wednesday, and Friday. Recheck in 2 weeks.  Call [phone] with any questions.

## 2018-04-12 NOTE — Telephone Encounter (Signed)
His kidney function is more than adequate for the dose that he is taking.

## 2018-04-12 NOTE — Telephone Encounter (Signed)
I called and spoke with patient's wife. We went over the information below and she verbalized understanding.

## 2018-04-13 ENCOUNTER — Ambulatory Visit (INDEPENDENT_AMBULATORY_CARE_PROVIDER_SITE_OTHER): Payer: PPO

## 2018-04-13 DIAGNOSIS — I442 Atrioventricular block, complete: Secondary | ICD-10-CM

## 2018-04-13 DIAGNOSIS — I495 Sick sinus syndrome: Secondary | ICD-10-CM

## 2018-04-14 LAB — CUP PACEART REMOTE DEVICE CHECK
Battery Impedance: 207 Ohm
Battery Remaining Longevity: 125 mo
Battery Voltage: 2.78 V
Brady Statistic AP VP Percent: 0 %
Brady Statistic AS VS Percent: 45 %
Date Time Interrogation Session: 20200122163854
Implantable Lead Implant Date: 20160301
Implantable Lead Implant Date: 20160301
Implantable Lead Location: 753859
Implantable Lead Location: 753860
Implantable Lead Model: 5076
Implantable Pulse Generator Implant Date: 20160301
Lead Channel Impedance Value: 420 Ohm
Lead Channel Impedance Value: 653 Ohm
Lead Channel Pacing Threshold Amplitude: 0.625 V
Lead Channel Pacing Threshold Pulse Width: 0.4 ms
Lead Channel Pacing Threshold Pulse Width: 0.4 ms
Lead Channel Setting Pacing Amplitude: 2 V
Lead Channel Setting Pacing Amplitude: 2.5 V
Lead Channel Setting Pacing Pulse Width: 0.4 ms
Lead Channel Setting Sensing Sensitivity: 4 mV
MDC IDC MSMT LEADCHNL RV PACING THRESHOLD AMPLITUDE: 0.625 V
MDC IDC STAT BRADY AP VS PERCENT: 55 %
MDC IDC STAT BRADY AS VP PERCENT: 0 %

## 2018-04-14 NOTE — Progress Notes (Signed)
Remote pacemaker transmission.   

## 2018-04-15 ENCOUNTER — Encounter: Payer: Self-pay | Admitting: Cardiology

## 2018-04-21 DIAGNOSIS — N183 Chronic kidney disease, stage 3 (moderate): Secondary | ICD-10-CM | POA: Diagnosis not present

## 2018-04-22 NOTE — Progress Notes (Signed)
Kevin English was seen today in neurologic consultation at the request of Libby Maw,*.  This patient is accompanied in the office by his spouse who supplements the history.  The consultation is for the evaluation of "gait disorder."  Prior records made available to me have been reviewed.  The patient saw [facility] neurology, Dr. Erlinda Hong, for the same thing back in 2017.  While he did recommend EMG/nerve conduction study, he thought that patient's weakness was giveaway on examination and recommended he increase his sertraline to 100 mg daily for depression.  He felt that symptoms were likely related to depression.  Pt states that, at the time, he was tired of testing and decided not to do the EMG testing.    In 2017, wife states that he was at [facility], he went to the restroom, and sat down on the floor and just couldn't get up.  He felt weak.  That is what led to the evaluation with Dr. Erlinda Hong.  He got better and didn't want to proceed with testing.  About a year after the first episode, he was outside in heat in the summer, he felt weak and then neighbor had to help him get in.  He went to the doctor and the BS was 600 so that was thought to be the cause.  Then a month ago, he fell outside and hit his head.  He felt dizzy and "I kept pushing myself to walk" and he fell.  He hit his head on a tree.  He crawled to the front yard and a neighbor helped him in.  Felt weak all over.  No lateralizing weakness or paresthesias.  He didn't check his BS.  Wife states that prior to this, he was having some trouble getting up and down from the chair like "his muscles were weak."  That is better.  No paresthesias.  Speech has been "slurred as usual."  States that he doesn't know why slurred.  Wife thinks that he is just "lazy."  States that he has always has word finding trouble.  That has not changed.  He notes some trouble with swallowing, but wife hasn't noticed that.  Wife states that after a "spell" he seems to  shuffle.  No diplopia  Neuroimaging has previously been performed but it was in the late 1990's.  States that he was in [facility] and were trying to r/o stroke.  Told he didn't have a definitive stroke.   ALLERGIES:   Allergies  Allergen Reactions  . Contrast Media [Iodinated Diagnostic Agents] Hives and Other (See Comments)    Patient started sneezing and coughing, patient broke out in hives, patient needs 13 hour prep if having IV contrast  . Ioxaglate Hives and Rash    Patient started sneezing and coughing, patient broke out in hives, patient needs 13 hour prep if having IV contrast  . Atorvastatin Other (See Comments)    Myalgias  . Crestor [Rosuvastatin] Other (See Comments)    Myalgias  . Fenofibrate Other (See Comments)    MYALGIAS   . Pravastatin Other (See Comments)    Myalgias   . Pseudoephedrine Hcl Palpitations    CURRENT MEDICATIONS:  Outpatient Encounter Medications as of 04/26/2018  Medication Sig  . acetaminophen (TYLENOL) 325 MG tablet Take 325 mg by mouth every 6 (six) hours as needed (pain).   Marland Kitchen amLODipine (NORVASC) 10 MG tablet TAKE 1 TABLET (10 MG TOTAL) BY MOUTH DAILY.  Marland Kitchen aspirin EC 81 MG tablet  Take 1 tablet (81 mg total) by mouth daily.  . Blood Glucose Monitoring Suppl (ONE TOUCH ULTRA MINI) w/Device KIT Use meter to check blood sugars 1-4 times daily as instructed.  Marland Kitchen buPROPion (WELLBUTRIN) 75 MG tablet Take each morning and a second pill mid afternoon.  Marland Kitchen FLUoxetine (PROZAC) 20 MG tablet Take 1 tablet (20 mg total) by mouth daily.  Marland Kitchen gabapentin (NEURONTIN) 400 MG capsule Take 1 capsule (400 mg total) by mouth at bedtime.  Marland Kitchen glucose blood (ONE TOUCH ULTRA TEST) test strip Use one strip per test. Test blood sugars 1-4 times daily as instructed.  . Lancets Misc. (ONE TOUCH SURESOFT) MISC Use 1 lancet per test. Test blood sugars 1-4 times per day as instructed.  Marland Kitchen levothyroxine (SYNTHROID, LEVOTHROID) 75 MCG tablet Take 1 tablet (75 mcg total) by  mouth daily before breakfast.  . loratadine (CLARITIN) 10 MG tablet Take 1 tablet (10 mg total) by mouth daily.  Marland Kitchen MELATONIN GUMMIES PO Take by mouth. Pt states taking 2 tablets 20 mg at night but making him more sleepy so taking 1 at night  . metFORMIN (GLUCOPHAGE) 500 MG tablet TAKE 1 TABLET (500 MG TOTAL) BY MOUTH 2 (TWO) TIMES DAILY WITH A MEAL.  . metoprolol tartrate (LOPRESSOR) 25 MG tablet Take 0.5 tablets (12.5 mg total) by mouth 2 (two) times daily.  . Multiple Vitamin (MULTIVITAMIN) tablet Take 1 tablet by mouth daily.  Marland Kitchen omega-3 acid ethyl esters (LOVAZA) 1 g capsule TAKE 2 CAPSULES (2 G TOTAL) BY MOUTH 2 (TWO) TIMES DAILY.  Marland Kitchen warfarin (COUMADIN) 5 MG tablet TAKE AS DIRECTED BY ANTICOAGULATION CLINIC   No facility-administered encounter medications on file as of 04/26/2018.     PAST MEDICAL HISTORY:   Past Medical History:  Diagnosis Date  . Anxiety   . Borderline diabetes   . CAD (coronary artery disease)    a. By cath 08/2013: 70% distal cx-prox LPDA; tandem mod LAD lesions, o/w mild dz.  . CKD (chronic kidney disease), stage II    his creat. runs 2-2.5; hasnt seen neph yet - sees dr. Tresa [facility]  . Complete heart block (HCC)    a. s/p PPM   . Deafness in left ear   . Depression   . Diabetes mellitus without complication ([place])   . Diverticulitis of colon 15 years ago  . Hernia   . HTN (hypertension)   . Hyperlipidemia   . Hypogonadism male   . Hypothyroidism   . Lung nodule seen on imaging study 04/10/2014   5 mm nodule right lower lobe  . Mitral stenosis    a. Dx 07/2013 - through workup of 2D echo, TEE, and cath, felt to be moderate.  . Obesity   . OSA on CPAP   . Persistent atrial fibrillation    a. sustained 160-180 on e-CARDIO monitor placed 07/29/2013. Placed on IV amiodarone at end of May 2015 due to continued paroxysms with RVR.  Marland Kitchen Renal cell carcinoma of right kidney ([place]) 09/05/2013   clear cell renal cell carcinoma of right kidney treated by radical  nephrectomy, Fuhrman grade 2-3, with maximum tumor diameter 2.7 cm, all tumor to find confined to the kidney, and all surgical margins negative (T1aN0).   . Renal mass    a. Dx 08/2013: concerning for renal cancer.  . Rheumatic heart disease   . S/P Minimally invasive maze operation for atrial fibrillation 05/15/2014   Left side lesion set using cryothermy with clipping of LA appendage  .  S/P minimally invasive mitral valve replacement with metallic valve and maze procedure 05/15/2014   59m Sorin [employer]omedics Optiform mechanical valve placed via right mini thoracotomy approach  . Stroke ([facility] 1998   "MRI showed 3 mild strokes"    PAST SURGICAL HISTORY:   Past Surgical History:  Procedure Laterality Date  . EYE SURGERY Right 1990   "reconstructive, lens implant- from paintball accident"  . INGUINAL HERNIA REPAIR Bilateral first one in 80's   "I had one side done twice"  . LEFT AND RIGHT HEART CATHETERIZATION WITH CORONARY ANGIOGRAM N/A 08/28/2013   Procedure: LEFT AND RIGHT HEART CATHETERIZATION WITH CORONARY ANGIOGRAM;  Surgeon: DLeonie Man MD;  [facility];  Service: Cardiovascular;  Laterality: N/A;  . MINIMALLY INVASIVE MAZE PROCEDURE N/A 05/15/2014   Procedure: MINIMALLY INVASIVE MAZE PROCEDURE;  Surgeon: CRexene Alberts MD;  [facility]  Service: Open Heart Surgery;  Laterality: N/A;  . MITRAL VALVE REPLACEMENT Right 05/15/2014   Procedure: MINIMALLY INVASIVE MITRAL VALVE (MV) REPLACEMENT;  Surgeon: CRexene Alberts MD;  [place]  Service: Open Heart Surgery;  Laterality: Right;  . PERMANENT PACEMAKER INSERTION Left 05/22/2014   a. MDT dual chamber PPM implanted by Dr ARayann Hemanfor CHB s/p MAZE/MVR  . ROBOT ASSISTED LAPAROSCOPIC NEPHRECTOMY Right 09/05/2013   Procedure: ROBOTIC ASSISTED LAPAROSCOPIC RIGHT RADICAL NEPHRECTOMY;  Surgeon: DSharyn Creamer MD;  [facility];  Service: Urology;  Laterality: Right;  . TEE WITHOUT CARDIOVERSION N/A 08/16/2013    Procedure: TRANSESOPHAGEAL ECHOCARDIOGRAM (TEE);  Surgeon: KDorothy Spark MD;  [place] [facility]  Service: Cardiovascular;  Laterality: N/A;  . TEE WITHOUT CARDIOVERSION N/A 05/15/2014   Procedure: TRANSESOPHAGEAL ECHOCARDIOGRAM (TEE);  Surgeon: CRexene Alberts MD;  [place] [facility]  Service: Open Heart Surgery;  Laterality: N/A;    SOCIAL HISTORY:   Social History   Socioeconomic History  . Marital status: Married    Spouse name: Not on file  . Number of children: 2  . Years of education: 116 . Highest education level: Not on file  Occupational History  . 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 Financial resource strain: Not on file  . Food insecurity:    Worry: Not on file    Inability: Not on file  . Transportation needs:    Medical: Not on file    Non-medical: Not on file  Tobacco Use  . Smoking status: Former Smoker    Packs/day: 0.50    Years: 50.00    Pack years: 25.00    Types: Cigarettes, Cigars  . Smokeless tobacco: Never Used  . Tobacco comment: Cigar every now and then  Substance and Sexual Activity  . Alcohol use: No    Alcohol/week: 0.0 standard drinks  . Drug use: No  . Sexual activity: Not Currently  Lifestyle  . Physical activity:    Days per week: Not on file    Minutes per session: Not on file  . Stress: Not on file  Relationships  . Social connections:    Talks on phone: Not on file    Gets together: Not on file    Attends religious service: Not on file    Active member of club or organization: Not on file    Attends meetings of clubs or organizations: Not on file    Relationship status: Not on file  . Intimate partner violence:    Fear of current or ex partner: Not on file  Emotionally abused: Not on file    Physically abused: Not on file    Forced sexual activity: Not on file  Other Topics Concern  . Not on file  Social History Narrative   Fun: Aggravate people.     FAMILY HISTORY:   Family Status    Relation Name Status  . Mother  Deceased  . Father  Alive  . Other  (Not Specified)  . Brother  Alive  . Son  Alive  . Daughter  Alive    ROS:  Review of Systems  Constitutional: Negative.   HENT: Negative.   Eyes: Negative.   Respiratory: Negative.   Cardiovascular: Negative.   Gastrointestinal: Negative.   Genitourinary: Negative.   Musculoskeletal: Negative.   Skin: Negative.   Endo/Heme/Allergies: Negative.   Psychiatric/Behavioral: Negative.     PHYSICAL EXAMINATION:     VITALS:   Vitals:   04/26/18 0936  BP: (!) 144/82  Pulse: 80  SpO2: 98%  Weight: 214 lb (97.1 kg)  Height: 5' 8"  (1.727 m)   Patient was undressed, placed into examining shorts and examined in gown for examination:  GEN:  Normal appears male in no acute distress.  Appears stated age. HEENT:  Normocephalic, atraumatic. The mucous membranes are moist. The superficial temporal arteries are without ropiness or tenderness. Cardiovascular: Regular rate and rhythm.  No tongue fasciculations. Lungs: Clear to auscultation bilaterally. Neck/Heme: There are no carotid bruits noted bilaterally.  NEUROLOGICAL: Orientation:  The patient is alert and oriented x 3.  Fund of knowledge is appropriate.  Recent and remote memory intact.  Attention span and concentration normal.  Repeats and names without difficulty. Cranial nerves: There is good facial symmetry.  There is facial hypomimia.  The R pupil is irregular and nonreactive.  The L is regular and reactive.   Fundoscopic exam reveals clear disc margins bilaterally. Extraocular muscles are intact and visual fields are full to confrontational testing. Speech is fluent and dysarthric and somewhat pseudobulbar. Soft palate rises symmetrically and there is no tongue deviation. Hearing is intact to conversational tone. Tone: Patient has trouble relaxing.  He he may have slight increased tone in the bilateral upper extremities, but it is very difficult to tell given  difficulty relaxing.  Tone in the lower extremities is normal. Sensation: Sensation is intact to light touch and pinprick throughout (facial, trunk, extremities). Vibration is intact at the bilateral big toe. There is no extinction with double simultaneous stimulation. There is no sensory dermatomal level identified. Coordination:  The patient exhibits decremation with all rapid alternating movements, including alternating supination and pronation of the forearm, hand opening and closing, finger taps, heel taps and toe taps. Motor: Strength is 5/5 in the bilateral upper and lower extremities.  Shoulder shrug is equal and symmetric. There is no pronator drift.  There are no fasciculations noted in the arms, legs, trunk DTR's: Deep tendon reflexes are 2/4 at the bilateral biceps, triceps, brachioradialis, 3/4 at the bilateral patella and achilles.  Plantar response is upgoing on the right and downgoing on the left Gait and Station: The patient arises on the second attempt without the use of his hands.  He does not shuffle.  He does have decreased arm swing. Abnormal movements: Slight tremulousness can be felt, but not seen.  Lab Results  Component Value Date   HGBA1C 6.0 01/10/2018      Chemistry      Component Value Date/Time   NA 139 03/09/2018 1113   NA 134 (A) 10/08/2016  NA 140 01/02/2016 0951   K 4.1 03/09/2018 1113   K 3.9 01/02/2016 0951   CL 102 03/09/2018 1113   CO2 24 03/09/2018 1113   CO2 22 01/02/2016 0951   BUN 18 03/09/2018 1113   BUN 19 10/08/2016   BUN 22.2 01/02/2016 0951   CREATININE 0.81 03/09/2018 1113   CREATININE 1.5 (H) 01/02/2016 0951   GLU 464 10/08/2016      Component Value Date/Time   CALCIUM 9.5 03/09/2018 1113   CALCIUM 10.5 (H) 01/02/2016 1139   CALCIUM 10.5 (H) 01/02/2016 0951   ALKPHOS 46 03/09/2018 1113   ALKPHOS 93 01/02/2016 0951   AST 23 03/09/2018 1113   AST 15 01/02/2016 0951   ALT 25 03/09/2018 1113   ALT 21 01/02/2016 0951   BILITOT 0.5  03/09/2018 1113   BILITOT 0.25 01/02/2016 0951     Lab Results  Component Value Date   TSH 2.03 03/09/2018   Lab Results  Component Value Date   VITAMINB12 372 08/22/2014      IMPRESSION/PLAN  1.  Falls, with pseudobulbar speech and some parkinsonian features  -I do think an atypical parkinsonian state is in the differential.  I would like to start with an MRI of the brain.  -I would like to do a DaTscan.  I did send an email to the radiologist about the spine given patient's iodine allergy.  Patient reports that he has had iodine since the episode in which he had "sneezing" (chart reports hives but patient denies that and states that he just had sneezing) and patient says that he was premedicated and did well.  I will find out if radiology is able to do this.  I did tell him he would need to hold the Wellbutrin and Prozac.  2.  Tobacco abuse  -Currently smoking 0.5 packs/day    - Patient was informed of the dangers of tobacco abuse including stroke, cancer, and MI, as well as benefits of tobacco cessation.  - Patient not willing to quit at this time.  - Approximately 3 mins were spent counseling patient cessation techniques. We discussed various methods to help quit smoking, including deciding on a date to quit, joining a support group, pharmacological agents- nicotine gum/patch/lozenges, chantix,   - I will reassess his progress at future visit.  3.  Follow-up with me after the above have been completed.    Cc:  Libby Maw, MD

## 2018-04-26 ENCOUNTER — Ambulatory Visit: Payer: PPO | Admitting: Neurology

## 2018-04-26 ENCOUNTER — Encounter: Payer: Self-pay | Admitting: Neurology

## 2018-04-26 ENCOUNTER — Ambulatory Visit: Payer: PPO | Admitting: Pharmacist

## 2018-04-26 VITALS — BP 144/82 | HR 80 | Ht 68.0 in | Wt 214.0 lb

## 2018-04-26 DIAGNOSIS — G20 Parkinson's disease: Secondary | ICD-10-CM | POA: Diagnosis not present

## 2018-04-26 DIAGNOSIS — R471 Dysarthria and anarthria: Secondary | ICD-10-CM | POA: Diagnosis not present

## 2018-04-26 DIAGNOSIS — Z720 Tobacco use: Secondary | ICD-10-CM | POA: Diagnosis not present

## 2018-04-26 DIAGNOSIS — Z5181 Encounter for therapeutic drug level monitoring: Secondary | ICD-10-CM | POA: Diagnosis not present

## 2018-04-26 DIAGNOSIS — I050 Rheumatic mitral stenosis: Secondary | ICD-10-CM

## 2018-04-26 DIAGNOSIS — R292 Abnormal reflex: Secondary | ICD-10-CM | POA: Diagnosis not present

## 2018-04-26 DIAGNOSIS — I5033 Acute on chronic diastolic (congestive) heart failure: Secondary | ICD-10-CM

## 2018-04-26 DIAGNOSIS — I4891 Unspecified atrial fibrillation: Secondary | ICD-10-CM

## 2018-04-26 DIAGNOSIS — R296 Repeated falls: Secondary | ICD-10-CM

## 2018-04-26 DIAGNOSIS — Z0000 Encounter for general adult medical examination without abnormal findings: Secondary | ICD-10-CM | POA: Diagnosis not present

## 2018-04-26 DIAGNOSIS — R251 Tremor, unspecified: Secondary | ICD-10-CM | POA: Diagnosis not present

## 2018-04-26 DIAGNOSIS — Z954 Presence of other heart-valve replacement: Secondary | ICD-10-CM | POA: Diagnosis not present

## 2018-04-26 LAB — POCT INR: INR: 3.4 — AB (ref 2.0–3.0)

## 2018-04-26 NOTE — Patient Instructions (Signed)
1. We have sent a referral to [facility] for your MRI and they will call you directly to schedule your appt. They are located at [address]. If you need to contact them directly please call [phone].  2. We have sent a referral to [facility] for your DAT scan and they will call you directly to schedule your appt.. They are located at [address] If you need to contact them directly please call [phone]. **STOP PROZAC AND BUPROPRION 7 DAYS PRIOR TO SCAN**

## 2018-04-27 ENCOUNTER — Telehealth: Payer: Self-pay | Admitting: Neurology

## 2018-04-27 NOTE — Telephone Encounter (Signed)
Patient wife just spoke with [facility] imaging and they can not do the MRI due to patient having a Pacemaker and she wants to speak to someone

## 2018-04-27 NOTE — Addendum Note (Signed)
Addended byAnnamaria Helling on: 04/27/2018 01:38 PM   Modules accepted: Orders

## 2018-04-27 NOTE — Telephone Encounter (Signed)
Order reentered for MR for Kevin English with pacemaker information. I will call Kevin English to schedule.

## 2018-04-27 NOTE — Telephone Encounter (Signed)
Called [facility] and his pacemaker is NOT MR compatible. Tried to call patient/wife back to make them aware. LMOM for them to call back.  Dr. Carles Collet - please advise next steps.

## 2018-04-27 NOTE — Telephone Encounter (Signed)
Okay.  Just do a CT without then (he doesn't want the contrast due to single kidney)

## 2018-04-28 NOTE — Telephone Encounter (Signed)
Spoke with patient's wife and made her aware. They are okay to proceed with CT. Order entered. MR order cancelled. Will set up.

## 2018-04-28 NOTE — Telephone Encounter (Signed)
CT scheduled 05/06/2018 at 3:30 pm.   Patient's wife made aware and given number to r/s if needed [phone].

## 2018-04-28 NOTE — Addendum Note (Signed)
Addended byAnnamaria Helling on: 04/28/2018 09:25 AM   Modules accepted: Orders

## 2018-04-30 ENCOUNTER — Other Ambulatory Visit: Payer: Self-pay | Admitting: Family Medicine

## 2018-04-30 DIAGNOSIS — R252 Cramp and spasm: Secondary | ICD-10-CM

## 2018-05-02 DIAGNOSIS — Z6828 Body mass index (BMI) 28.0-28.9, adult: Secondary | ICD-10-CM | POA: Diagnosis not present

## 2018-05-02 DIAGNOSIS — I129 Hypertensive chronic kidney disease with stage 1 through stage 4 chronic kidney disease, or unspecified chronic kidney disease: Secondary | ICD-10-CM | POA: Diagnosis not present

## 2018-05-02 DIAGNOSIS — N183 Chronic kidney disease, stage 3 (moderate): Secondary | ICD-10-CM | POA: Diagnosis not present

## 2018-05-02 DIAGNOSIS — I4891 Unspecified atrial fibrillation: Secondary | ICD-10-CM | POA: Diagnosis not present

## 2018-05-02 DIAGNOSIS — N179 Acute kidney failure, unspecified: Secondary | ICD-10-CM | POA: Diagnosis not present

## 2018-05-02 DIAGNOSIS — I509 Heart failure, unspecified: Secondary | ICD-10-CM | POA: Diagnosis not present

## 2018-05-02 DIAGNOSIS — E1122 Type 2 diabetes mellitus with diabetic chronic kidney disease: Secondary | ICD-10-CM | POA: Diagnosis not present

## 2018-05-02 DIAGNOSIS — Z8679 Personal history of other diseases of the circulatory system: Secondary | ICD-10-CM | POA: Diagnosis not present

## 2018-05-02 DIAGNOSIS — F329 Major depressive disorder, single episode, unspecified: Secondary | ICD-10-CM | POA: Diagnosis not present

## 2018-05-02 DIAGNOSIS — C649 Malignant neoplasm of unspecified kidney, except renal pelvis: Secondary | ICD-10-CM | POA: Diagnosis not present

## 2018-05-02 DIAGNOSIS — D751 Secondary polycythemia: Secondary | ICD-10-CM | POA: Diagnosis not present

## 2018-05-06 ENCOUNTER — Ambulatory Visit (HOSPITAL_COMMUNITY)
Admission: RE | Admit: 2018-05-06 | Discharge: 2018-05-06 | Disposition: A | Discharge status: Home / Self Care | Payer: PPO | Source: Ambulatory Visit | Attending: Neurology | Admitting: Neurology

## 2018-05-06 DIAGNOSIS — R292 Abnormal reflex: Secondary | ICD-10-CM | POA: Diagnosis not present

## 2018-05-06 DIAGNOSIS — R471 Dysarthria and anarthria: Secondary | ICD-10-CM

## 2018-05-06 DIAGNOSIS — R251 Tremor, unspecified: Secondary | ICD-10-CM | POA: Diagnosis not present

## 2018-05-06 DIAGNOSIS — R296 Repeated falls: Secondary | ICD-10-CM | POA: Diagnosis not present

## 2018-05-09 ENCOUNTER — Telehealth: Payer: Self-pay | Admitting: Neurology

## 2018-05-09 NOTE — Telephone Encounter (Signed)
Can you find out when daT is scheduled?  Looks like it was ordered a few weeks ago.

## 2018-05-09 NOTE — Telephone Encounter (Signed)
Message was sent on 04/28/2018 to schedule. I sent another message to Lovena Le today to check on the status of this order.

## 2018-05-09 NOTE — Telephone Encounter (Signed)
Mychart message sent to patient.

## 2018-05-09 NOTE — Telephone Encounter (Signed)
-----   Message from Cameron Sprang, MD sent at 05/09/2018 10:15 AM EST ----- Pls let him know that the head CT did not show any evidence of tumor, stroke, or bleed. It showed age-related changes with hardening of the small blood vessels seen in patients with blood pressure, cholesterol issues. Continue control of these conditions. Thanks

## 2018-05-09 NOTE — Telephone Encounter (Signed)
Received note that they will call patient tomorrow morning to schedule.

## 2018-05-10 ENCOUNTER — Ambulatory Visit: Payer: PPO | Admitting: Pharmacist

## 2018-05-10 DIAGNOSIS — I5033 Acute on chronic diastolic (congestive) heart failure: Secondary | ICD-10-CM | POA: Diagnosis not present

## 2018-05-10 DIAGNOSIS — Z5181 Encounter for therapeutic drug level monitoring: Secondary | ICD-10-CM | POA: Diagnosis not present

## 2018-05-10 DIAGNOSIS — I050 Rheumatic mitral stenosis: Secondary | ICD-10-CM | POA: Diagnosis not present

## 2018-05-10 DIAGNOSIS — Z0000 Encounter for general adult medical examination without abnormal findings: Secondary | ICD-10-CM

## 2018-05-10 DIAGNOSIS — Z954 Presence of other heart-valve replacement: Secondary | ICD-10-CM | POA: Diagnosis not present

## 2018-05-10 DIAGNOSIS — I4891 Unspecified atrial fibrillation: Secondary | ICD-10-CM

## 2018-05-10 LAB — POCT INR: INR: 3 (ref 2.0–3.0)

## 2018-05-10 NOTE — Patient Instructions (Signed)
Description   Continue taking 1.5 tablets daily except 1 tablet on Monday, Wednesday, and Friday. Recheck in 4 weeks.  Call [phone] with any questions.

## 2018-05-10 NOTE — Telephone Encounter (Signed)
Dat Scan scheduled 06/09/2018. Dr. Carles Collet Juluis [place]ier.

## 2018-05-13 ENCOUNTER — Telehealth: Payer: Self-pay | Admitting: Family Medicine

## 2018-05-13 NOTE — Telephone Encounter (Signed)
Spoke with Kevin English regarding AWV. Wife stated that Kevin English will give office a call back when he is ready to schedule. SF

## 2018-05-16 ENCOUNTER — Ambulatory Visit (HOSPITAL_COMMUNITY)
Admission: RE | Admit: 2018-05-16 | Discharge: 2018-05-16 | Disposition: A | Discharge status: Home / Self Care | Payer: PPO | Source: Ambulatory Visit | Attending: Nurse Practitioner | Admitting: Nurse Practitioner

## 2018-05-16 ENCOUNTER — Encounter (HOSPITAL_COMMUNITY): Payer: Self-pay | Admitting: Nurse Practitioner

## 2018-05-16 VITALS — BP 132/78 | HR 78 | Ht 68.0 in | Wt 214.0 lb

## 2018-05-16 DIAGNOSIS — N182 Chronic kidney disease, stage 2 (mild): Secondary | ICD-10-CM | POA: Insufficient documentation

## 2018-05-16 DIAGNOSIS — Z7901 Long term (current) use of anticoagulants: Secondary | ICD-10-CM | POA: Insufficient documentation

## 2018-05-16 DIAGNOSIS — Z888 Allergy status to other drugs, medicaments and biological substances status: Secondary | ICD-10-CM | POA: Diagnosis not present

## 2018-05-16 DIAGNOSIS — E1122 Type 2 diabetes mellitus with diabetic chronic kidney disease: Secondary | ICD-10-CM | POA: Insufficient documentation

## 2018-05-16 DIAGNOSIS — Z7982 Long term (current) use of aspirin: Secondary | ICD-10-CM | POA: Diagnosis not present

## 2018-05-16 DIAGNOSIS — I4510 Unspecified right bundle-branch block: Secondary | ICD-10-CM | POA: Insufficient documentation

## 2018-05-16 DIAGNOSIS — Z87891 Personal history of nicotine dependence: Secondary | ICD-10-CM | POA: Diagnosis not present

## 2018-05-16 DIAGNOSIS — E785 Hyperlipidemia, unspecified: Secondary | ICD-10-CM | POA: Diagnosis not present

## 2018-05-16 DIAGNOSIS — F329 Major depressive disorder, single episode, unspecified: Secondary | ICD-10-CM | POA: Diagnosis not present

## 2018-05-16 DIAGNOSIS — F419 Anxiety disorder, unspecified: Secondary | ICD-10-CM | POA: Insufficient documentation

## 2018-05-16 DIAGNOSIS — I4891 Unspecified atrial fibrillation: Secondary | ICD-10-CM | POA: Diagnosis not present

## 2018-05-16 DIAGNOSIS — Z8249 Family history of ischemic heart disease and other diseases of the circulatory system: Secondary | ICD-10-CM | POA: Insufficient documentation

## 2018-05-16 DIAGNOSIS — E669 Obesity, unspecified: Secondary | ICD-10-CM | POA: Insufficient documentation

## 2018-05-16 DIAGNOSIS — Z905 Acquired absence of kidney: Secondary | ICD-10-CM | POA: Insufficient documentation

## 2018-05-16 DIAGNOSIS — Z79899 Other long term (current) drug therapy: Secondary | ICD-10-CM | POA: Insufficient documentation

## 2018-05-16 DIAGNOSIS — Z79890 Hormone replacement therapy (postmenopausal): Secondary | ICD-10-CM | POA: Insufficient documentation

## 2018-05-16 DIAGNOSIS — Z952 Presence of prosthetic heart valve: Secondary | ICD-10-CM | POA: Insufficient documentation

## 2018-05-16 DIAGNOSIS — E039 Hypothyroidism, unspecified: Secondary | ICD-10-CM | POA: Diagnosis not present

## 2018-05-16 DIAGNOSIS — R9431 Abnormal electrocardiogram [ECG] [EKG]: Secondary | ICD-10-CM | POA: Insufficient documentation

## 2018-05-16 DIAGNOSIS — Z91041 Radiographic dye allergy status: Secondary | ICD-10-CM | POA: Diagnosis not present

## 2018-05-16 DIAGNOSIS — Z6832 Body mass index (BMI) 32.0-32.9, adult: Secondary | ICD-10-CM | POA: Insufficient documentation

## 2018-05-16 DIAGNOSIS — I444 Left anterior fascicular block: Secondary | ICD-10-CM | POA: Insufficient documentation

## 2018-05-16 DIAGNOSIS — Z833 Family history of diabetes mellitus: Secondary | ICD-10-CM | POA: Insufficient documentation

## 2018-05-16 DIAGNOSIS — Z7984 Long term (current) use of oral hypoglycemic drugs: Secondary | ICD-10-CM | POA: Diagnosis not present

## 2018-05-16 DIAGNOSIS — I050 Rheumatic mitral stenosis: Secondary | ICD-10-CM | POA: Diagnosis not present

## 2018-05-16 DIAGNOSIS — I2510 Atherosclerotic heart disease of native coronary artery without angina pectoris: Secondary | ICD-10-CM | POA: Insufficient documentation

## 2018-05-16 DIAGNOSIS — I5030 Unspecified diastolic (congestive) heart failure: Secondary | ICD-10-CM | POA: Insufficient documentation

## 2018-05-16 DIAGNOSIS — I452 Bifascicular block: Secondary | ICD-10-CM | POA: Insufficient documentation

## 2018-05-16 DIAGNOSIS — I130 Hypertensive heart and chronic kidney disease with heart failure and stage 1 through stage 4 chronic kidney disease, or unspecified chronic kidney disease: Secondary | ICD-10-CM | POA: Insufficient documentation

## 2018-05-16 DIAGNOSIS — G4733 Obstructive sleep apnea (adult) (pediatric): Secondary | ICD-10-CM | POA: Insufficient documentation

## 2018-05-16 DIAGNOSIS — I4819 Other persistent atrial fibrillation: Secondary | ICD-10-CM | POA: Diagnosis not present

## 2018-05-16 NOTE — Progress Notes (Signed)
Primary Care Physician: Libby Maw, MD Referring Physician: Dr. 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  He is in the afib clinic for long term surveillance of afib s/p maze procedure. He received a PPM at time of surgery. Interrogations  of his device have not shown any afib. He has had depression that has required treatment since his surgery. He feels well today. He has not noted any irregular heart beat. He feels much improved with surgery and his fatigue and shortness of breath is much improved. He continues on warfarin for metal valve. His wife states that his BP was up slightly with Dr. Francesca Oman visit a few weeks ago but stable today. Dr. [name] has encouraged for him to try to be active but he struggles with depression and lack of motivation.  Today, he denies symptoms of palpitations, chest pain, shortness of breath, orthopnea, PND, lower extremity edema, dizziness, presyncope, syncope, or neurologic sequela. The patient is tolerating medications without difficulties and is otherwise without complaint today.   Past Medical History:  Diagnosis Date  . Anxiety   . Borderline diabetes   . CAD (coronary artery disease)    a. By cath 08/2013: 70% distal cx-prox LPDA; tandem mod LAD lesions, o/w mild dz.  . CKD (chronic kidney disease), stage II    his creat. runs 2-2.5; hasnt seen neph yet - sees dr. Tresa Moore at [facility]  . Complete heart block (HCC)    a. s/p PPM   . Deafness in left ear   . Depression   . Diabetes mellitus without complication ([place])   . Diverticulitis of colon 15 years ago  . Hernia   . HTN (hypertension)   . Hyperlipidemia   . Hypogonadism male   . Hypothyroidism   . Lung nodule seen on  imaging study 04/10/2014   5 mm nodule right lower lobe  . Mitral stenosis    a. Dx 07/2013 - through workup of 2D echo, TEE, and cath, felt to be moderate.  . Obesity   . OSA on CPAP   . Persistent atrial fibrillation    a. sustained 160-180 on e-CARDIO monitor placed 07/29/2013. Placed on IV amiodarone at end of May 2015 due to continued paroxysms with RVR.  Marland Kitchen Renal cell carcinoma of right kidney ([place]) 09/05/2013   clear cell renal cell carcinoma of right kidney treated by radical nephrectomy, Fuhrman grade 2-3, with maximum tumor diameter 2.7 cm, all tumor to find confined to the kidney, and all surgical margins negative (T1aN0).   . Renal mass    a. Dx 08/2013: concerning for renal cancer.  . Rheumatic heart disease   . S/P Minimally invasive maze operation for atrial fibrillation 05/15/2014   Left side lesion set using cryothermy with clipping of LA appendage  . S/P minimally invasive mitral valve replacement with metallic valve and maze procedure 05/15/2014   60m Sorin [employer]omed[employer] Optiform mechanical valve placed via right mini thoracotomy approach  . Stroke ([facility] 1998   "MRI showed 3 mild strokes"   Past Surgical History:  Procedure Laterality Date  . EYE SURGERY Right 1990   "reconstructive, lens implant- from paintball accident"  . INGUINAL HERNIA REPAIR Bilateral first one in 80's   "I  had one side done twice"  . LEFT AND RIGHT HEART CATHETERIZATION WITH CORONARY ANGIOGRAM N/A 08/28/2013   Procedure: LEFT AND RIGHT HEART CATHETERIZATION WITH CORONARY ANGIOGRAM;  Surgeon: Leonie Man, MD;  Location: [facility] CATH LAB;  Service: Cardiovascular;  Laterality: N/A;  . MINIMALLY INVASIVE MAZE PROCEDURE N/A 05/15/2014   Procedure: MINIMALLY INVASIVE MAZE PROCEDURE;  Surgeon: Rexene Alberts, MD;  Location: [place];  Service: Open Heart Surgery;  Laterality: N/A;  . MITRAL VALVE REPLACEMENT Right 05/15/2014   Procedure: MINIMALLY INVASIVE MITRAL VALVE (MV) REPLACEMENT;  Surgeon: Rexene Alberts,  MD;  Location: [place];  Service: Open Heart Surgery;  Laterality: Right;  . PERMANENT PACEMAKER INSERTION Left 05/22/2014   a. MDT dual chamber PPM implanted by Dr Rayann Heman for CHB s/p MAZE/MVR  . ROBOT ASSISTED LAPAROSCOPIC NEPHRECTOMY Right 09/05/2013   Procedure: ROBOTIC ASSISTED LAPAROSCOPIC RIGHT RADICAL NEPHRECTOMY;  Surgeon: Sharyn Creamer, MD;  Location: [facility];  Service: Urology;  Laterality: Right;  . TEE WITHOUT CARDIOVERSION N/A 08/16/2013   Procedure: TRANSESOPHAGEAL ECHOCARDIOGRAM (TEE);  Surgeon: Dorothy Spark, MD;  Location: [place];  Service: Cardiovascular;  Laterality: N/A;  . TEE WITHOUT CARDIOVERSION N/A 05/15/2014   Procedure: TRANSESOPHAGEAL ECHOCARDIOGRAM (TEE);  Surgeon: Rexene Alberts, MD;  Location: [place];  Service: Open Heart Surgery;  Laterality: N/A;    Current Outpatient Medications  Medication Sig Dispense Refill  . acetaminophen (TYLENOL) 325 MG tablet Take 325 mg by mouth every 6 (six) hours as needed (pain).     Marland Kitchen amLODipine (NORVASC) 10 MG tablet TAKE 1 TABLET (10 MG TOTAL) BY MOUTH DAILY. 90 tablet 2  . aspirin EC 81 MG tablet Take 1 tablet (81 mg total) by mouth daily.    . Blood Glucose Monitoring Suppl (ONE TOUCH ULTRA MINI) w/Device KIT Use meter to check blood sugars 1-4 times daily as instructed. 1 each 0  . buPROPion (WELLBUTRIN) 75 MG tablet Take each morning and a second pill mid afternoon. 60 tablet 2  . FLUoxetine (PROZAC) 20 MG tablet Take 1 tablet (20 mg total) by mouth daily. 90 tablet 1  . gabapentin (NEURONTIN) 400 MG capsule TAKE 1 CAPSULE (400 MG TOTAL) BY MOUTH AT BEDTIME. 90 capsule 1  . glucose blood (ONE TOUCH ULTRA TEST) test strip Use one strip per test. Test blood sugars 1-4 times daily as instructed. 100 each 12  . Lancets Misc. (ONE TOUCH SURESOFT) MISC Use 1 lancet per test. Test blood sugars 1-4 times per day as instructed. 1 each 1  . levothyroxine (SYNTHROID, LEVOTHROID) 75 MCG tablet Take 1 tablet (75 mcg total) by mouth  daily before breakfast. 90 tablet 2  . loratadine (CLARITIN) 10 MG tablet Take 1 tablet (10 mg total) by mouth daily.    Marland Kitchen MELATONIN GUMMIES PO Take by mouth. Pt states taking 2 tablets 20 mg at night but making him more sleepy so taking 1 at night    . metFORMIN (GLUCOPHAGE) 500 MG tablet TAKE 1 TABLET (500 MG TOTAL) BY MOUTH 2 (TWO) TIMES DAILY WITH A MEAL. 60 tablet 5  . metoprolol tartrate (LOPRESSOR) 25 MG tablet Take 0.5 tablets (12.5 mg total) by mouth 2 (two) times daily. 180 tablet 3  . Multiple Vitamin (MULTIVITAMIN) tablet Take 1 tablet by mouth daily.    Marland Kitchen omega-3 acid ethyl esters (LOVAZA) 1 g capsule TAKE 2 CAPSULES (2 G TOTAL) BY MOUTH 2 (TWO) TIMES DAILY. 360 capsule 1  . warfarin (COUMADIN) 5 MG tablet TAKE AS DIRECTED  BY ANTICOAGULATION CLINIC 120 tablet 1   No current facility-administered medications for this encounter.     Allergies  Allergen Reactions  . Contrast Media [Iodinated Diagnostic Agents] Hives and Other (See Comments)    Patient started sneezing and coughing, patient broke out in hives, patient needs 13 hour prep if having IV contrast  . Ioxaglate Hives and Rash    Patient started sneezing and coughing, patient broke out in hives, patient needs 13 hour prep if having IV contrast  . Atorvastatin Other (See Comments)    Myalgias  . Crestor [Rosuvastatin] Other (See Comments)    Myalgias  . Fenofibrate Other (See Comments)    MYALGIAS   . Pravastatin Other (See Comments)    Myalgias   . Pseudoephedrine Hcl Palpitations    Social History   Socioeconomic History  . Marital status: Married    Spouse name: Not on file  . Number of children: 2  . Years of education: 22  . Highest education level: Not on file  Occupational History  . Occupation: Retired     Comment: Careers information officer  . Financial resource strain: Not on file  . Food insecurity:    Worry: Not on file    Inability: Not on file  . Transportation needs:    Medical: Not on  file    Non-medical: Not on file  Tobacco Use  . Smoking status: Former Smoker    Packs/day: 0.50    Years: 50.00    Pack years: 25.00    Types: Cigarettes, Cigars  . Smokeless tobacco: Never Used  . Tobacco comment: Cigar every now and then  Substance and Sexual Activity  . Alcohol use: No    Alcohol/week: 0.0 standard drinks  . Drug use: No  . Sexual activity: Not Currently  Lifestyle  . Physical activity:    Days per week: Not on file    Minutes per session: Not on file  . Stress: Not on file  Relationships  . Social connections:    Talks on phone: Not on file    Gets together: Not on file    Attends religious service: Not on file    Active member of club or organization: Not on file    Attends meetings of clubs or organizations: Not on file    Relationship status: Not on file  . Intimate partner violence:    Fear of current or ex partner: Not on file    Emotionally abused: Not on file    Physically abused: Not on file    Forced sexual activity: Not on file  Other Topics Concern  . Not on file  Social History Narrative   Fun: [employer] people.     Family History  Problem Relation Age of Onset  . Hypertension Mother   . Heart attack Mother   . Stroke Mother   . Heart disease Father   . Hypertension Other   . Heart disease Brother   . Diabetes Brother   . Healthy Son   . Healthy Daughter     ROS- All systems are reviewed and negative except as per the HPI above  Physical Exam: Vitals:   05/16/18 1128  BP: 132/78  Pulse: 78  Weight: 97.1 kg  Height: 5' 8"  (1.727 m)   Wt Readings from Last 3 Encounters:  05/16/18 97.1 kg  04/26/18 97.1 kg  04/08/18 96.3 kg    Labs: Lab Results  Component Value Date   NA 139 03/09/2018  K 4.1 03/09/2018   CL 102 03/09/2018   CO2 24 03/09/2018   GLUCOSE 95 03/09/2018   BUN 18 03/09/2018   CREATININE 0.81 03/09/2018   CALCIUM 9.5 03/09/2018   MG 2.3 04/08/2018   Lab Results  Component Value Date   INR  3.0 05/10/2018   Lab Results  Component Value Date   CHOL 160 04/22/2017   HDL 22 (L) 04/22/2017   LDLCALC 77 04/22/2017   TRIG 307 (H) 04/22/2017     GEN- The patient is well appearing, alert and oriented x 3 today.   Head- normocephalic, atraumatic Eyes-  Sclera clear, conjunctiva pink Ears- hearing intact Oropharynx- clear Neck- supple, no JVP Lymph- no cervical lymphadenopathy Lungs- Clear to ausculation bilaterally, normal work of breathing Heart- Regular rate and rhythm, no murmurs, rubs or gallops, PMI not laterally displaced GI- soft, NT, ND, + BS Extremities- no clubbing, cyanosis, or edema MS- no significant deformity or atrophy Skin- no rash or lesion Psych- euthymic mood, full affect Neuro- strength and sensation are intact  EKG-atrial paced rhythm with prolonged AV conduction, RBBB, LAFB, pr int 210 ms, qrx int 148 ms, qtc 446 ms Epic records reviewed Remote and in office interrogations reviewed   Assessment and Plan: 1. Afib, s/p MVR and Maze procedure, clipping of LAA No evidence of recurrent afib Pt feels  improved with physical symptoms of shortness of breath and fatigue since SR has been restored Continue coumadin Continue metoprolol at 12.5 mg bid  2. PPM Per Dr. Rayann Heman  F/u in one year for long term surveillance for presence of afib s/p Maze procedure    Butch Penny C. Nillie Bartolotta, [facility] [address] [place],  [address] [phone]

## 2018-05-23 ENCOUNTER — Encounter: Payer: Self-pay | Admitting: Family Medicine

## 2018-05-23 DIAGNOSIS — N183 Chronic kidney disease, stage 3 (moderate): Secondary | ICD-10-CM | POA: Diagnosis not present

## 2018-05-25 ENCOUNTER — Other Ambulatory Visit: Payer: Self-pay | Admitting: Family Medicine

## 2018-05-25 DIAGNOSIS — E559 Vitamin D deficiency, unspecified: Secondary | ICD-10-CM

## 2018-05-25 NOTE — Telephone Encounter (Signed)
Patient no longer needs high dose vit d. Should just be taking a multivitamin of his choice.

## 2018-06-06 ENCOUNTER — Telehealth: Payer: Self-pay | Admitting: Neurology

## 2018-06-06 ENCOUNTER — Telehealth: Payer: Self-pay

## 2018-06-06 NOTE — Telephone Encounter (Signed)
Spoke with patient's wife and made her aware Prozac and Wellbutrin will need stopped for Dat Scan.

## 2018-06-06 NOTE — Telephone Encounter (Signed)
Wife is calling in about DAT Scan and about which medication to hold before this. Please call and clarify this with her at [phone]. Thanks!

## 2018-06-06 NOTE — Telephone Encounter (Signed)
Patient will be due for follow up at the end of next month.

## 2018-06-06 NOTE — Telephone Encounter (Signed)
Copied from [place] [id number]. Topic: General - Other >> Jun 06, 2018  3:35 PM Marin Olp L wrote: Reason for CRM: Patient's wife, Santiago Glad wants to know when patient next A1C is due so she can r/s his appointment she just canceled appropriately. Would also like a call once upcoming tests for neuro are in.

## 2018-06-07 NOTE — Telephone Encounter (Signed)
I called and spoke with patient's wife. Patient is scheduled for a DAT scan next week. They will wait to get those results and see what Dr. Carles Collet wants to do and then they will schedule a follow up with you.

## 2018-06-08 ENCOUNTER — Other Ambulatory Visit: Payer: Self-pay

## 2018-06-08 ENCOUNTER — Ambulatory Visit: Payer: Self-pay | Admitting: Family Medicine

## 2018-06-08 ENCOUNTER — Ambulatory Visit (INDEPENDENT_AMBULATORY_CARE_PROVIDER_SITE_OTHER): Payer: PPO | Admitting: *Deleted

## 2018-06-08 DIAGNOSIS — I5033 Acute on chronic diastolic (congestive) heart failure: Secondary | ICD-10-CM

## 2018-06-08 DIAGNOSIS — I050 Rheumatic mitral stenosis: Secondary | ICD-10-CM | POA: Diagnosis not present

## 2018-06-08 DIAGNOSIS — I4891 Unspecified atrial fibrillation: Secondary | ICD-10-CM | POA: Diagnosis not present

## 2018-06-08 DIAGNOSIS — Z954 Presence of other heart-valve replacement: Secondary | ICD-10-CM | POA: Diagnosis not present

## 2018-06-08 DIAGNOSIS — Z0000 Encounter for general adult medical examination without abnormal findings: Secondary | ICD-10-CM

## 2018-06-08 DIAGNOSIS — Z5181 Encounter for therapeutic drug level monitoring: Secondary | ICD-10-CM | POA: Diagnosis not present

## 2018-06-08 LAB — POCT INR: INR: 3 (ref 2.0–3.0)

## 2018-06-08 NOTE — Patient Instructions (Signed)
Description   Continue taking 1.5 tablets daily except 1 tablet on Monday, Wednesday, and Friday. Recheck in 6 weeks.  Call [phone] with any questions.

## 2018-06-09 ENCOUNTER — Encounter (HOSPITAL_COMMUNITY): Payer: PPO

## 2018-06-09 ENCOUNTER — Ambulatory Visit (HOSPITAL_COMMUNITY): Payer: PPO

## 2018-06-11 ENCOUNTER — Other Ambulatory Visit: Payer: Self-pay | Admitting: Family Medicine

## 2018-06-11 DIAGNOSIS — F322 Major depressive disorder, single episode, severe without psychotic features: Secondary | ICD-10-CM

## 2018-06-15 ENCOUNTER — Other Ambulatory Visit (HOSPITAL_COMMUNITY): Payer: PPO

## 2018-06-15 ENCOUNTER — Other Ambulatory Visit: Payer: Self-pay | Admitting: Cardiology

## 2018-06-15 ENCOUNTER — Ambulatory Visit (HOSPITAL_COMMUNITY): Payer: PPO

## 2018-06-25 ENCOUNTER — Other Ambulatory Visit: Payer: Self-pay | Admitting: Family Medicine

## 2018-06-25 DIAGNOSIS — N183 Chronic kidney disease, stage 3 unspecified: Secondary | ICD-10-CM

## 2018-06-25 DIAGNOSIS — E1122 Type 2 diabetes mellitus with diabetic chronic kidney disease: Secondary | ICD-10-CM

## 2018-07-04 ENCOUNTER — Telehealth: Payer: Self-pay | Admitting: Family Medicine

## 2018-07-04 NOTE — Telephone Encounter (Signed)
Called about rescheduling follow up appt with Dr Ethelene Hal but patient wants to wait until after scan on 07/26/2018 unless they reschedule that and then he said it would depend on how far out they reschedule. Patient will call closer to that date to reschedule.

## 2018-07-05 NOTE — Progress Notes (Addendum)
Virtual Visit via Video Note  I connected with [name] on 07/06/18 at  2:00 PM EDT by a video enabled telemedicine application and verified that I am speaking with the correct person using two identifiers.   THIS ENCOUNTER IS A VIRTUAL VISIT DUE TO COVID-19 - PATIENT WAS NOT SEEN IN THE OFFICE. PATIENT HAS CONSENTED TO VIRTUAL VISIT / TELEMEDICINE VISIT   Location of patient: home  Location of provider: office  I discussed the limitations of evaluation and management by telemedicine and the availability of in person appointments. The patient expressed understanding and agreed to proceed.   Subjective:   Kevin English is a 63 y.o. male who presents for an Initial Medicare Annual Wellness Visit.  Review of Systems No ROS.  Medicare Wellness Visit. Additional risk factors are reflected in the social history. Cardiac Risk Factors include: advanced age (>61mn, >>42women);hypertension;male gender;diabetes mellitus;sedentary lifestyle;smoking/ tobacco exposure;obesity (BMI >30kg/m2) Sleep patterns: takes Melatonin to help with sleep. Home Safety/Smoke Alarms: Feels safe in home. Smoke alarms in place.  Lives in 1 story home with basement.  Male:   CCS-  Declines at this time.   PSA-  Lab Results  Component Value Date   PSA 0.54 07/01/2017      Objective:    Unable to assess vitals. This visit is enabled though telemedicine due to Covid 19.   Advanced Directives 07/06/2018 12/22/2016 03/03/2016 01/21/2016 01/09/2016 12/03/2015 05/16/2014  Does Patient Have a Medical Advance Directive? Yes Yes No Yes Yes Yes Yes  Type of AParamedicof ABuffaloLiving will - - HSpecial educational needs teacherof [place] will Living will;Healthcare Power of Attorney Living will;Healthcare Power of Attorney  Does patient want to make changes to medical advance directive? No - Guardian declined - - - - - -  Copy of HWilbargerin Chart? No - copy  requested - - No - copy requested No - copy requested No - copy requested No - copy requested  Pre-existing out of facility DNR order (yellow form or pink MOST form) - - - - - - -    Current Medications (verified) Outpatient Encounter Medications as of 07/06/2018  Medication Sig  . acetaminophen (TYLENOL) 325 MG tablet Take 325 mg by mouth every 6 (six) hours as needed (pain).   .Marland KitchenamLODipine (NORVASC) 10 MG tablet TAKE 1 TABLET (10 MG TOTAL) BY MOUTH DAILY.  .Marland Kitchenaspirin EC 81 MG tablet Take 1 tablet (81 mg total) by mouth daily.  . Blood Glucose Monitoring Suppl (ONE TOUCH ULTRA MINI) w/Device KIT Use meter to check blood sugars 1-4 times daily as instructed.  .Marland KitchenbuPROPion (WELLBUTRIN) 75 MG tablet TAKE 1 TABLET BY MOUTH EACH MORNING AND A SECOND PILL MID AFTERNOON.  .Marland KitchenFLUoxetine (PROZAC) 20 MG tablet Take 1 tablet (20 mg total) by mouth daily.  .Marland Kitchengabapentin (NEURONTIN) 400 MG capsule TAKE 1 CAPSULE (400 MG TOTAL) BY MOUTH AT BEDTIME.  .Marland Kitchenglucose blood (ONE TOUCH ULTRA TEST) test strip Use one strip per test. Test blood sugars 1-4 times daily as instructed.  . Lancets Misc. (ONE TOUCH SURESOFT) MISC Use 1 lancet per test. Test blood sugars 1-4 times per day as instructed.  .Marland Kitchenlevothyroxine (SYNTHROID, LEVOTHROID) 75 MCG tablet Take 1 tablet (75 mcg total) by mouth daily before breakfast.  . loratadine (CLARITIN) 10 MG tablet Take 1 tablet (10 mg total) by mouth daily.  .Marland KitchenMELATONIN GUMMIES PO Take by mouth. Pt states taking 2 tablets 20  mg at night but making him more sleepy so taking 1 at night  . metFORMIN (GLUCOPHAGE) 500 MG tablet TAKE 1 TABLET (500 MG TOTAL) BY MOUTH 2 (TWO) TIMES DAILY WITH A MEAL.  . metoprolol tartrate (LOPRESSOR) 25 MG tablet Take 0.5 tablets (12.5 mg total) by mouth 2 (two) times daily.  Marland Kitchen omega-3 acid ethyl esters (LOVAZA) 1 g capsule TAKE 2 CAPSULES (2 G TOTAL) BY MOUTH 2 (TWO) TIMES DAILY.  Marland Kitchen warfarin (COUMADIN) 5 MG tablet TAKE AS DIRECTED BY ANTICOAGULATION CLINIC  .  Multiple Vitamin (MULTIVITAMIN) tablet Take 1 tablet by mouth daily.   No facility-administered encounter medications on file as of 07/06/2018.     Allergies (verified) Contrast media [iodinated diagnostic agents]; Ioxaglate; Atorvastatin; Crestor [rosuvastatin]; Fenofibrate; Pravastatin; and Pseudoephedrine hcl   History: Past Medical History:  Diagnosis Date  . Anxiety   . Borderline diabetes   . CAD (coronary artery disease)    a. By cath 08/2013: 70% distal cx-prox LPDA; tandem mod LAD lesions, o/w mild dz.  . CKD (chronic kidney disease), stage II    his creat. runs 2-2.5; hasnt seen neph yet - sees dr. Tresa Moore at [facility]  . Complete heart block (HCC)    a. s/p PPM   . Deafness in left ear   . Depression   . Diabetes mellitus without complication ([place])   . Diverticulitis of colon 15 years ago  . Hernia   . HTN (hypertension)   . Hyperlipidemia   . Hypogonadism male   . Hypothyroidism   . Lung nodule seen on imaging study 04/10/2014   5 mm nodule right lower lobe  . Mitral stenosis    a. Dx 07/2013 - through workup of 2D echo, TEE, and cath, felt to be moderate.  . Obesity   . OSA on CPAP   . Persistent atrial fibrillation    a. sustained 160-180 on e-CARDIO monitor placed 07/29/2013. Placed on IV amiodarone at end of May 2015 due to continued paroxysms with RVR.  Marland Kitchen Renal cell carcinoma of right kidney ([place]) 09/05/2013   clear cell renal cell carcinoma of right kidney treated by radical nephrectomy, Fuhrman grade 2-3, with maximum tumor diameter 2.7 cm, all tumor to find confined to the kidney, and all surgical margins negative (T1aN0).   . Renal mass    a. Dx 08/2013: concerning for renal cancer.  . Rheumatic heart disease   . S/P Minimally invasive maze operation for atrial fibrillation 05/15/2014   Left side lesion set using cryothermy with clipping of LA appendage  . S/P minimally invasive mitral valve replacement with metallic valve and maze procedure 05/15/2014   34m  Sorin [employer]omedics Optiform mechanical valve placed via right mini thoracotomy approach  . Stroke ([facility] 1998   "MRI showed 3 mild strokes"   Past Surgical History:  Procedure Laterality Date  . EYE SURGERY Right 1990   "reconstructive, lens implant- from paintball accident"  . INGUINAL HERNIA REPAIR Bilateral first one in 80's   "I had one side done twice"  . LEFT AND RIGHT HEART CATHETERIZATION WITH CORONARY ANGIOGRAM N/A 08/28/2013   Procedure: LEFT AND RIGHT HEART CATHETERIZATION WITH CORONARY ANGIOGRAM;  Surgeon: DLeonie Man MD;  [facility]  Service: Cardiovascular;  Laterality: N/A;  . MINIMALLY INVASIVE MAZE PROCEDURE N/A 05/15/2014   Procedure: MINIMALLY INVASIVE MAZE PROCEDURE;  Surgeon: CRexene Alberts MD;  [place] [facility]  Service: Open Heart Surgery;  Laterality: N/A;  . MITRAL VALVE REPLACEMENT Right 05/15/2014  Procedure: MINIMALLY INVASIVE MITRAL VALVE (MV) REPLACEMENT;  Surgeon: Rexene Alberts, MD;  Location: [place];  Service: Open Heart Surgery;  Laterality: Right;  . PERMANENT PACEMAKER INSERTION Left 05/22/2014   a. MDT dual chamber PPM implanted by Dr Rayann Heman for CHB s/p MAZE/MVR  . ROBOT ASSISTED LAPAROSCOPIC NEPHRECTOMY Right 09/05/2013   Procedure: ROBOTIC ASSISTED LAPAROSCOPIC RIGHT RADICAL NEPHRECTOMY;  Surgeon: Sharyn Creamer, MD;  Location[facility];  Service: Urology;  Laterality: Right;  . TEE WITHOUT CARDIOVERSION N/A 08/16/2013   Procedure: TRANSESOPHAGEAL ECHOCARDIOGRAM (TEE);  Surgeon: Dorothy Spark, MD;  Location: [place];  Service: Cardiovascular;  Laterality: N/A;  . TEE WITHOUT CARDIOVERSION N/A 05/15/2014   Procedure: TRANSESOPHAGEAL ECHOCARDIOGRAM (TEE);  Surgeon: Rexene Alberts, MD;  Location: [place];  Service: Open Heart Surgery;  Laterality: N/A;   Family History  Problem Relation Age of Onset  . Hypertension Mother   . Heart attack Mother   . Stroke Mother   . Heart disease Father   . Hypertension Other   . Heart disease  Brother   . Diabetes Brother   . Healthy Son   . Healthy Daughter    Social History   Socioeconomic History  . Marital status: Married    Spouse name: Not on file  . Number of children: 2  . Years of education: 24  . Highest education level: Not on file  Occupational History  . Occupation: Retired     Comment: Careers information officer  . Financial resource strain: Not on file  . Food insecurity:    Worry: Not on file    Inability: Not on file  . Transportation needs:    Medical: Not on file    Non-medical: Not on file  Tobacco Use  . Smoking status: Current Every Day Smoker    Packs/day: 0.75    Years: 50.00    Pack years: 37.50    Types: Cigarettes, Cigars  . Smokeless tobacco: Never Used  . Tobacco comment: Cigar every now and then  Substance and Sexual Activity  . Alcohol use: No    Alcohol/week: 0.0 standard drinks  . Drug use: No  . Sexual activity: Not Currently  Lifestyle  . Physical activity:    Days per week: Not on file    Minutes per session: Not on file  . Stress: Not on file  Relationships  . Social connections:    Talks on phone: Not on file    Gets together: Not on file    Attends religious service: Not on file    Active member of club or organization: Not on file    Attends meetings of clubs or organizations: Not on file    Relationship status: Not on file  Other Topics Concern  . Not on file  Social History Narrative   Fun: Aggravate people.    Tobacco Counseling Ready to quit: No Counseling given: No Comment: Cigar every now and then   Clinical Intake:     Pain : No/denies pain                 Activities of Daily Living In your present state of health, do you have any difficulty performing the following activities: 07/06/2018  Hearing? Y  Comment deaf in left. hearing aid in right.  Vision? Y  Comment glasses.  Difficulty concentrating or making decisions? N  Walking or climbing stairs? N  Dressing or bathing? N   Doing errands, shopping? N  Conservation officer, nature and  eating ? N  Using the Toilet? N  In the past six months, have you accidently leaked urine? N  Do you have problems with loss of bowel control? N  Managing your Medications? N  Managing your Finances? Y  Housekeeping or managing your Housekeeping? N  Some recent data might be hidden     Immunizations and Health Maintenance Immunization History  Administered Date(s) Administered  . Influenza,inj,Quad PF,6+ Mos 04/01/2017, 01/10/2018  . Influenza-Unspecified 03/23/1989  . Pneumococcal Polysaccharide-23 04/01/2017  . Td 03/23/2004   Health Maintenance Due  Topic Date Due  . COLONOSCOPY  05/18/2003  . TETANUS/TDAP  03/23/2014  . FOOT EXAM  10/22/2017  . URINE MICROALBUMIN  04/01/2018  . PNA vac Low Risk Adult (1 of 2 - PCV13) 05/17/2018    Patient Care Team: Libby Maw, MD as PCP - General (Family Medicine) Dorothy Spark, MD as PCP - Cardiology (Cardiology)  Indicate any recent Medical Services you may have received from other than [facility] providers in the past year (date may be approximate).    Assessment:   This is a routine wellness examination for [employer]. Physical assessment deferred to PCP.  Hearing/Vision screen Unable to assess. This visit is enabled though telemedicine due to Covid 19.  Dietary issues and exercise activities discussed: Current Exercise Habits: The patient does not participate in regular exercise at present, Exercise limited by: None identified Diet (meal preparation, eat out, water intake, caffeinated beverages, dairy products, fruits and vegetables): on average, 2 meals per day    Goals    . DIET - INCREASE WATER INTAKE    . Increase physical activity    . Quit Smoking      Depression Screen PHQ 2/9 Scores 07/06/2018 12/09/2017 11/08/2017 10/25/2017  PHQ - 2 Score 2 0 0 1  PHQ- 9 Score 8 0 5 7    Fall Risk Fall Risk  07/06/2018 04/26/2018 01/05/2017 12/31/2016 12/22/2016  Falls in the  past year? 1 1 No No No  Number falls in past yr: 1 1 - - -  Injury with Fall? 0 0 - - -  Risk for fall due to : Impaired balance/gait;History of fall(s);Impaired mobility History of fall(s);Impaired balance/gait - - -  Follow up - Falls evaluation completed - - -    Cognitive Function:  MMSE  Screening Tests Health Maintenance  Topic Date Due  . COLONOSCOPY  05/18/2003  . TETANUS/TDAP  03/23/2014  . FOOT EXAM  10/22/2017  . URINE MICROALBUMIN  04/01/2018  . PNA vac Low Risk Adult (1 of 2 - PCV13) 05/17/2018  . HEMOGLOBIN A1C  07/12/2018  . INFLUENZA VACCINE  10/22/2018  . OPHTHALMOLOGY EXAM  01/07/2019  . Hepatitis C Screening  Completed  . HIV Screening  Completed      Plan:   See you next year!  Continue to eat heart healthy diet (full of fruits, vegetables, whole grains, lean protein, water--limit salt, fat, and sugar intake) and increase physical activity as tolerated.  Continue doing brain stimulating activities (puzzles, reading, adult coloring books, staying active) to keep memory sharp.   Bring a copy of your living will and/or healthcare power of attorney to your next office visit.    I have personally reviewed and noted the following in the patient's chart:   . Medical and social history . Use of alcohol, tobacco or illicit drugs  . Current medications and supplements . Functional ability and status . Nutritional status . Physical activity . Advanced directives .  List of other physicians . Hospitalizations, surgeries, and ER visits in previous 12 months . Vitals . Screenings to include cognitive, depression, and falls . Referrals and appointments  In addition, I have reviewed and discussed with patient certain preventive protocols, quality metrics, and best practice recommendations. A written personalized care plan for preventive services as well as general preventive health recommendations were provided to patient.     Shela Nevin, [place]    07/06/2018     Reviewed and agree.

## 2018-07-06 ENCOUNTER — Ambulatory Visit (INDEPENDENT_AMBULATORY_CARE_PROVIDER_SITE_OTHER): Payer: PPO | Admitting: *Deleted

## 2018-07-06 ENCOUNTER — Encounter: Payer: Self-pay | Admitting: *Deleted

## 2018-07-06 DIAGNOSIS — Z0000 Encounter for general adult medical examination without abnormal findings: Secondary | ICD-10-CM | POA: Diagnosis not present

## 2018-07-06 NOTE — Patient Instructions (Signed)
See you next year!  Continue to eat heart healthy diet (full of fruits, vegetables, whole grains, lean protein, water--limit salt, fat, and sugar intake) and increase physical activity as tolerated.  Continue doing brain stimulating activities (puzzles, reading, adult coloring books, staying active) to keep memory sharp.   Bring a copy of your living will and/or healthcare power of attorney to your next office visit.   Kevin English , Thank you for taking time to come for your Medicare Wellness Visit. I appreciate your ongoing commitment to your health goals. Please review the following plan we discussed and let me know if I can assist you in the future.   These are the goals we discussed: Goals    . DIET - INCREASE WATER INTAKE    . Increase physical activity    . Quit Smoking       This is a list of the screening recommended for you and due dates:  Health Maintenance  Topic Date Due  . Colon Cancer Screening  05/18/2003  . Tetanus Vaccine  03/23/2014  . Complete foot exam   10/22/2017  . Urine Protein Check  04/01/2018  . Pneumonia vaccines (1 of 2 - PCV13) 05/17/2018  . Hemoglobin A1C  07/12/2018  . Flu Shot  10/22/2018  . Eye exam for diabetics  01/07/2019  .  Hepatitis C: One time screening is recommended by Center for Disease Control  (CDC) for  adults born from [birth year] through 1967.   Completed  . HIV Screening  Completed    Health Maintenance After Age 58 After age 74, you are at a higher risk for certain long-term diseases and infections as well as injuries from falls. Falls are a major cause of broken bones and head injuries in people who are older than age 61. Getting regular preventive care can help to keep you healthy and well. Preventive care includes getting regular testing and making lifestyle changes as recommended by your health care provider. Talk with your health care provider about:  Which screenings and tests you should have. A screening is a test that checks for  a disease when you have no symptoms.  A diet and exercise plan that is right for you. What should I know about screenings and tests to prevent falls? Screening and testing are the best ways to find a health problem early. Early diagnosis and treatment give you the best chance of managing medical conditions that are common after age 24. Certain conditions and lifestyle choices may make you more likely to have a fall. Your health care provider may recommend:  Regular vision checks. Poor vision and conditions such as cataracts can make you more likely to have a fall. If you wear glasses, make sure to get your prescription updated if your vision changes.  Medicine review. Work with your health care provider to regularly review all of the medicines you are taking, including over-the-counter medicines. Ask your health care provider about any side effects that may make you more likely to have a fall. Tell your health care provider if any medicines that you take make you feel dizzy or sleepy.  Osteoporosis screening. Osteoporosis is a condition that causes the bones to get weaker. This can make the bones weak and cause them to break more easily.  Blood pressure screening. Blood pressure changes and medicines to control blood pressure can make you feel dizzy.  Strength and balance checks. Your health care provider may recommend certain tests to check your strength and balance  while standing, walking, or changing positions.  Foot health exam. Foot pain and numbness, as well as not wearing proper footwear, can make you more likely to have a fall.  Depression screening. You may be more likely to have a fall if you have a fear of falling, feel emotionally low, or feel unable to do activities that you used to do.  Alcohol use screening. Using too much alcohol can affect your balance and may make you more likely to have a fall. What actions can I take to lower my risk of falls? General instructions  Talk  with your health care provider about your risks for falling. Tell your health care provider if: ? You fall. Be sure to tell your health care provider about all falls, even ones that seem minor. ? You feel dizzy, sleepy, or off-balance.  Take over-the-counter and prescription medicines only as told by your health care provider. These include any supplements.  Eat a healthy diet and maintain a healthy weight. A healthy diet includes low-fat dairy products, low-fat (lean) meats, and fiber from whole grains, beans, and lots of fruits and vegetables. Home safety  Remove any tripping hazards, such as rugs, cords, and clutter.  Install safety equipment such as grab bars in bathrooms and safety rails on stairs.  Keep rooms and walkways well-lit. Activity   Follow a regular exercise program to stay fit. This will help you maintain your balance. Ask your health care provider what types of exercise are appropriate for you.  If you need a cane or walker, use it as recommended by your health care provider.  Wear supportive shoes that have nonskid soles. Lifestyle  Do not drink alcohol if your health care provider tells you not to drink.  If you drink alcohol, limit how much you have: ? 0-1 drink a day for women. ? 0-2 drinks a day for men.  Be aware of how much alcohol is in your drink. In the [place] one drink equals one typical bottle of beer (12 oz), one-half glass of wine (5 oz), or one shot of hard liquor (1 oz).  Do not use any products that contain nicotine or tobacco, such as cigarettes and e-cigarettes. If you need help quitting, ask your health care provider. Summary  Having a healthy lifestyle and getting preventive care can help to protect your health and wellness after age 50.  Screening and testing are the best way to find a health problem early and help you avoid having a fall. Early diagnosis and treatment give you the best chance for managing medical conditions that are more  common for people who are older than age 36.  Falls are a major cause of broken bones and head injuries in people who are older than age 78. Take precautions to prevent a fall at home.  Work with your health care provider to learn what changes you can make to improve your health and wellness and to prevent falls. This information is not intended to replace advice given to you by your health care provider. Make sure you discuss any questions you have with your health care provider. Document Released: 01/20/2017 Document Revised: 01/20/2017 Document Reviewed: 01/20/2017 Elsevier Interactive Patient Education  2019 [employer].

## 2018-07-08 DIAGNOSIS — G4733 Obstructive sleep apnea (adult) (pediatric): Secondary | ICD-10-CM | POA: Diagnosis not present

## 2018-07-12 ENCOUNTER — Telehealth: Payer: Self-pay | Admitting: Neurology

## 2018-07-12 NOTE — Telephone Encounter (Signed)
Patient's wife called regarding his DAT Scan being rescheduled twice. She was wondering if there was another location he could go to? Please Call. Thanks

## 2018-07-12 NOTE — Telephone Encounter (Signed)
Called and spoke with Santiago Glad, we discussed if not scheduled by middle of May, she will call and we will talk about possibly sending to an academic center nearby since [place]. may be used as [place].

## 2018-07-13 ENCOUNTER — Ambulatory Visit (INDEPENDENT_AMBULATORY_CARE_PROVIDER_SITE_OTHER): Payer: PPO | Admitting: *Deleted

## 2018-07-13 ENCOUNTER — Other Ambulatory Visit: Payer: Self-pay

## 2018-07-13 DIAGNOSIS — I442 Atrioventricular block, complete: Secondary | ICD-10-CM | POA: Diagnosis not present

## 2018-07-13 LAB — CUP PACEART REMOTE DEVICE CHECK
Battery Impedance: 207 Ohm
Battery Remaining Longevity: 124 mo
Battery Voltage: 2.78 V
Brady Statistic AP VP Percent: 0 %
Brady Statistic AP VS Percent: 57 %
Brady Statistic AS VP Percent: 0 %
Brady Statistic AS VS Percent: 42 %
Date Time Interrogation Session: 20200422124714
Implantable Lead Implant Date: 20160301
Implantable Lead Implant Date: 20160301
Implantable Lead Location: 753859
Implantable Lead Location: 753860
Implantable Lead Model: 5076
Implantable Lead Model: 5076
Implantable Pulse Generator Implant Date: 20160301
Lead Channel Impedance Value: 448 Ohm
Lead Channel Impedance Value: 688 Ohm
Lead Channel Pacing Threshold Amplitude: 0.625 V
Lead Channel Pacing Threshold Amplitude: 0.625 V
Lead Channel Pacing Threshold Pulse Width: 0.4 ms
Lead Channel Pacing Threshold Pulse Width: 0.4 ms
Lead Channel Setting Pacing Amplitude: 2 V
Lead Channel Setting Pacing Amplitude: 2.5 V
Lead Channel Setting Pacing Pulse Width: 0.4 ms
Lead Channel Setting Sensing Sensitivity: 5.6 mV

## 2018-07-18 DIAGNOSIS — N183 Chronic kidney disease, stage 3 (moderate): Secondary | ICD-10-CM | POA: Diagnosis not present

## 2018-07-19 ENCOUNTER — Telehealth: Payer: Self-pay

## 2018-07-19 ENCOUNTER — Other Ambulatory Visit: Payer: Self-pay | Admitting: Cardiology

## 2018-07-19 NOTE — Telephone Encounter (Signed)

## 2018-07-19 NOTE — Telephone Encounter (Signed)
lmom for prescreen  

## 2018-07-20 ENCOUNTER — Ambulatory Visit (INDEPENDENT_AMBULATORY_CARE_PROVIDER_SITE_OTHER): Payer: PPO | Admitting: *Deleted

## 2018-07-20 ENCOUNTER — Other Ambulatory Visit: Payer: Self-pay

## 2018-07-20 DIAGNOSIS — I4891 Unspecified atrial fibrillation: Secondary | ICD-10-CM | POA: Diagnosis not present

## 2018-07-20 DIAGNOSIS — Z954 Presence of other heart-valve replacement: Secondary | ICD-10-CM

## 2018-07-20 DIAGNOSIS — Z0000 Encounter for general adult medical examination without abnormal findings: Secondary | ICD-10-CM | POA: Diagnosis not present

## 2018-07-20 DIAGNOSIS — I050 Rheumatic mitral stenosis: Secondary | ICD-10-CM

## 2018-07-20 DIAGNOSIS — Z5181 Encounter for therapeutic drug level monitoring: Secondary | ICD-10-CM

## 2018-07-20 DIAGNOSIS — I5033 Acute on chronic diastolic (congestive) heart failure: Secondary | ICD-10-CM | POA: Diagnosis not present

## 2018-07-20 LAB — POCT INR: INR: 3.9 — AB (ref 2.0–3.0)

## 2018-07-20 NOTE — Patient Instructions (Signed)
Description   Spoke with pt and instructed pt to hold today's dose then continue taking 1.5 tablets daily except 1 tablet on Monday, Wednesday, and Friday. Recheck in 5 weeks.  Call [phone] with any questions.

## 2018-07-21 ENCOUNTER — Encounter: Payer: Self-pay | Admitting: Cardiology

## 2018-07-21 NOTE — Progress Notes (Signed)
Remote pacemaker transmission.   

## 2018-07-26 ENCOUNTER — Other Ambulatory Visit (HOSPITAL_COMMUNITY): Payer: PPO

## 2018-07-26 ENCOUNTER — Ambulatory Visit (HOSPITAL_COMMUNITY): Payer: PPO

## 2018-08-08 DIAGNOSIS — H43822 Vitreomacular adhesion, left eye: Secondary | ICD-10-CM | POA: Diagnosis not present

## 2018-08-08 DIAGNOSIS — H5211 Myopia, right eye: Secondary | ICD-10-CM | POA: Diagnosis not present

## 2018-08-08 DIAGNOSIS — E119 Type 2 diabetes mellitus without complications: Secondary | ICD-10-CM | POA: Diagnosis not present

## 2018-08-08 DIAGNOSIS — H2512 Age-related nuclear cataract, left eye: Secondary | ICD-10-CM | POA: Diagnosis not present

## 2018-08-08 DIAGNOSIS — H52221 Regular astigmatism, right eye: Secondary | ICD-10-CM | POA: Diagnosis not present

## 2018-08-08 DIAGNOSIS — H43811 Vitreous degeneration, right eye: Secondary | ICD-10-CM | POA: Diagnosis not present

## 2018-08-08 DIAGNOSIS — H524 Presbyopia: Secondary | ICD-10-CM | POA: Diagnosis not present

## 2018-08-08 DIAGNOSIS — H52222 Regular astigmatism, left eye: Secondary | ICD-10-CM | POA: Diagnosis not present

## 2018-08-08 DIAGNOSIS — H35371 Puckering of macula, right eye: Secondary | ICD-10-CM | POA: Diagnosis not present

## 2018-08-22 ENCOUNTER — Telehealth: Payer: Self-pay | Admitting: Neurology

## 2018-08-22 ENCOUNTER — Telehealth: Payer: Self-pay

## 2018-08-22 NOTE — Telephone Encounter (Signed)
wellbutrin and prozac.  They technically don't have to be stopped but okay to stop for the scan.  What is the question about the radiotracer?  Its odd that radiology can't answer this since they will be administering it.

## 2018-08-22 NOTE — Telephone Encounter (Signed)
Called to let the pt know that we have a clinic closer to them but they preferred to keep their scheduled appt and then look at getting into the [facility]

## 2018-08-22 NOTE — Telephone Encounter (Signed)
Called patient and patient spouse no answer left message to call office back in regards to message below

## 2018-08-22 NOTE — Telephone Encounter (Signed)
Please advise not sure what medication patient needs to stop Please advise on dye as well

## 2018-08-22 NOTE — Telephone Encounter (Signed)
Patient wife called and states that she has gotten his DAT scan sch for 09-08-18 and they told her that he would need to stop taking two of his medication on 08-30-18 and to talk with our office about this. Also she has some questions about the dye that they will use and was told to talk to our office as well about this   Please call

## 2018-08-22 NOTE — Telephone Encounter (Signed)

## 2018-08-23 NOTE — Telephone Encounter (Signed)
She states that she will contact radiology for more information in regards to the scan

## 2018-08-23 NOTE — Telephone Encounter (Signed)
Called spoke with patient spouse Santiago Glad. She was informed of provider response below.

## 2018-08-24 ENCOUNTER — Other Ambulatory Visit: Payer: Self-pay

## 2018-08-24 ENCOUNTER — Ambulatory Visit (INDEPENDENT_AMBULATORY_CARE_PROVIDER_SITE_OTHER): Payer: PPO | Admitting: Pharmacist

## 2018-08-24 DIAGNOSIS — I5033 Acute on chronic diastolic (congestive) heart failure: Secondary | ICD-10-CM

## 2018-08-24 DIAGNOSIS — I4891 Unspecified atrial fibrillation: Secondary | ICD-10-CM

## 2018-08-24 DIAGNOSIS — Z954 Presence of other heart-valve replacement: Secondary | ICD-10-CM

## 2018-08-24 DIAGNOSIS — Z0000 Encounter for general adult medical examination without abnormal findings: Secondary | ICD-10-CM

## 2018-08-24 DIAGNOSIS — Z5181 Encounter for therapeutic drug level monitoring: Secondary | ICD-10-CM

## 2018-08-24 DIAGNOSIS — I050 Rheumatic mitral stenosis: Secondary | ICD-10-CM

## 2018-08-24 LAB — POCT INR: INR: 3.8 — AB (ref 2.0–3.0)

## 2018-08-24 NOTE — Patient Instructions (Signed)
Description   Take 1/2 tablet tomorrow, then continue taking 1.5 tablets daily except 1 tablet on Monday, Wednesday, and Friday. Recheck in 4 weeks in the [place] office.  Call [phone] with any questions.

## 2018-09-08 ENCOUNTER — Encounter (HOSPITAL_COMMUNITY)
Admission: RE | Admit: 2018-09-08 | Discharge: 2018-09-08 | Disposition: A | Discharge status: Home / Self Care | Payer: PPO | Source: Ambulatory Visit | Attending: Neurology | Admitting: Neurology

## 2018-09-08 ENCOUNTER — Other Ambulatory Visit (HOSPITAL_COMMUNITY): Payer: PPO

## 2018-09-08 ENCOUNTER — Ambulatory Visit (HOSPITAL_COMMUNITY)
Admission: RE | Admit: 2018-09-08 | Discharge: 2018-09-08 | Disposition: A | Discharge status: Home / Self Care | Payer: PPO | Source: Ambulatory Visit | Attending: Neurology | Admitting: Neurology

## 2018-09-08 ENCOUNTER — Other Ambulatory Visit: Payer: Self-pay

## 2018-09-08 DIAGNOSIS — R269 Unspecified abnormalities of gait and mobility: Secondary | ICD-10-CM | POA: Diagnosis not present

## 2018-09-08 DIAGNOSIS — R251 Tremor, unspecified: Secondary | ICD-10-CM | POA: Diagnosis not present

## 2018-09-08 MED ORDER — IOFLUPANE I 123 185 MBQ/2.5ML IV SOLN
4.7000 | Freq: Once | INTRAVENOUS | Status: AC | PRN
  Administered 2018-09-08: 4.7 via INTRAVENOUS
  Filled 2018-09-08: qty 5

## 2018-09-12 NOTE — Progress Notes (Signed)
Kevin English was seen today in neurologic consultation at the request of Libby Maw,*.  This patient is accompanied in the office by his spouse who supplements the history.  The consultation is for the evaluation of "gait disorder."  Prior records made available to me have been reviewed.  The patient saw [facility] neurology, Dr. Erlinda Hong, for the same thing back in 2017.  While he did recommend EMG/nerve conduction study, he thought that patient's weakness was giveaway on examination and recommended he increase his sertraline to 100 mg daily for depression.  He felt that symptoms were likely related to depression.  Pt states that, at the time, he was tired of testing and decided not to do the EMG testing.    In 2017, wife states that he was at [facility], he went to the restroom, and sat down on the floor and just couldn't get up.  He felt weak.  That is what led to the evaluation with Dr. Erlinda Hong.  He got better and didn't want to proceed with testing.  About a year after the first episode, he was outside in heat in the summer, he felt weak and then neighbor had to help him get in.  He went to the doctor and the BS was 600 so that was thought to be the cause.  Then a month ago, he fell outside and hit his head.  He felt dizzy and "I kept pushing myself to walk" and he fell.  He hit his head on a tree.  He crawled to the front yard and a neighbor helped him in.  Felt weak all over.  No lateralizing weakness or paresthesias.  He didn't check his BS.  Wife states that prior to this, he was having some trouble getting up and down from the chair like "his muscles were weak."  That is better.  No paresthesias.  Speech has been "slurred as usual."  States that he doesn't know why slurred.  Wife thinks that he is just "lazy."  States that he has always has word finding trouble.  That has not changed.  He notes some trouble with swallowing, but wife hasn't noticed that.  Wife states that after a "spell" he seems to  shuffle.  No diplopia  Neuroimaging has previously been performed but it was in the late 1990's.  States that he was in [facility] and were trying to r/o stroke.  Told he didn't have a definitive stroke.  09/12/2018.  Patient seen today in follow-up for falls with pseudobulbar speech.  DaTscan was done and personally reviewed by me.  This was done on September 08, 2018.  It was normal.  We wanted an MRI of the brain, but the patient was unable to have this due to the fact that his pacemaker was not MRI compatible.  He had a CT of the brain and this demonstrated at least moderate small vessel disease.  There was also moderate atrophy.  I have personally reviewed this film.  He reports "about 3 falls" since our last visit.  The last fall he was turning around in the bathroom and fell and hit a cabinet.  He fell backward with that one but "usually I fall forward."  Denies swallow trouble.  No double vision.  Legs are cramping some.  Takes gabapentin at night and asks me "if it is worth a nickel."  "I take it for my legs jumping."   ALLERGIES:   Allergies  Allergen Reactions   Contrast  Media [Iodinated Diagnostic Agents] Hives and Other (See Comments)    Patient started sneezing and coughing, patient broke out in hives, patient needs 13 hour prep if having IV contrast   Ioxaglate Hives and Rash    Patient started sneezing and coughing, patient broke out in hives, patient needs 13 hour prep if having IV contrast   Atorvastatin Other (See Comments)    Myalgias   Crestor [Rosuvastatin] Other (See Comments)    Myalgias   Fenofibrate Other (See Comments)    MYALGIAS    Pravastatin Other (See Comments)    Myalgias    Pseudoephedrine Hcl Palpitations    CURRENT MEDICATIONS:  Outpatient Encounter Medications as of 09/13/2018  Medication Sig   acetaminophen (TYLENOL) 325 MG tablet Take 325 mg by mouth every 6 (six) hours as needed (pain).    amLODipine (NORVASC) 10 MG tablet TAKE 1  TABLET (10 MG TOTAL) BY MOUTH DAILY.   aspirin EC 81 MG tablet Take 1 tablet (81 mg total) by mouth daily.   Blood Glucose Monitoring Suppl (ONE TOUCH ULTRA MINI) w/Device KIT Use meter to check blood sugars 1-4 times daily as instructed.   buPROPion (WELLBUTRIN) 75 MG tablet TAKE 1 TABLET BY MOUTH EACH MORNING AND A SECOND PILL MID AFTERNOON.   FLUoxetine (PROZAC) 20 MG tablet Take 1 tablet (20 mg total) by mouth daily.   gabapentin (NEURONTIN) 400 MG capsule TAKE 1 CAPSULE (400 MG TOTAL) BY MOUTH AT BEDTIME.   glucose blood (ONE TOUCH ULTRA TEST) test strip Use one strip per test. Test blood sugars 1-4 times daily as instructed.   Lancets Misc. (ONE TOUCH SURESOFT) MISC Use 1 lancet per test. Test blood sugars 1-4 times per day as instructed.   levothyroxine (SYNTHROID, LEVOTHROID) 75 MCG tablet Take 1 tablet (75 mcg total) by mouth daily before breakfast.   loratadine (CLARITIN) 10 MG tablet Take 1 tablet (10 mg total) by mouth daily.   MELATONIN GUMMIES PO Take by mouth. Pt states taking 2 tablets 20 mg at night but making him more sleepy so taking 1 at night   metFORMIN (GLUCOPHAGE) 500 MG tablet TAKE 1 TABLET (500 MG TOTAL) BY MOUTH 2 (TWO) TIMES DAILY WITH A MEAL.   metoprolol tartrate (LOPRESSOR) 25 MG tablet Take 0.5 tablets (12.5 mg total) by mouth 2 (two) times daily.   omega-3 acid ethyl esters (LOVAZA) 1 g capsule TAKE 2 CAPSULES (2 G TOTAL) BY MOUTH 2 (TWO) TIMES DAILY.   warfarin (COUMADIN) 5 MG tablet TAKE AS DIRECTED BY ANTICOAGULATION CLINIC   [DISCONTINUED] Multiple Vitamin (MULTIVITAMIN) tablet Take 1 tablet by mouth daily.   No facility-administered encounter medications on file as of 09/13/2018.     PAST MEDICAL HISTORY:   Past Medical History:  Diagnosis Date   Anxiety    Borderline diabetes    CAD (coronary artery disease)    a. By cath 08/2013: 70% distal cx-prox LPDA; tandem mod LAD lesions, o/w mild dz.   CKD (chronic kidney disease), stage II      his creat. runs 2-2.5; hasnt seen neph yet - sees [facility]. Tresa [facility]   Complete heart block [facility])    a. s/p PPM    Deafness in left ear    Depression    Diabetes mellitus without complication ([place])    Diverticulitis of colon 15 years ago   Hernia    HTN (hypertension)    Hyperlipidemia    Hypogonadism male    Hypothyroidism    Lung nodule  seen on imaging study 04/10/2014   5 mm nodule right lower lobe   Mitral stenosis    a. Dx 07/2013 - through workup of 2D echo, TEE, and cath, felt to be moderate.   Obesity    OSA on CPAP    Persistent atrial fibrillation    a. sustained 160-180 on e-CARDIO monitor placed 07/29/2013. Placed on IV amiodarone at end of May 2015 due to continued paroxysms with RVR.   Renal cell carcinoma of right kidney ([place]) 09/05/2013   clear cell renal cell carcinoma of right kidney treated by radical nephrectomy, Fuhrman grade 2-3, with maximum tumor diameter 2.7 cm, all tumor to find confined to the kidney, and all surgical margins negative (T1aN0).    Renal mass    a. Dx 08/2013: concerning for renal cancer.   Rheumatic heart disease    S/P Minimally invasive maze operation for atrial fibrillation 05/15/2014   Left side lesion set using cryothermy with clipping of LA appendage   S/P minimally invasive mitral valve replacement with metallic valve and maze procedure 05/15/2014   76m Sorin [employer]omed[employer] Optiform mechanical valve placed via right mini thoracotomy approach   Stroke ([place] 1998   "MRI showed 3 mild strokes"    PAST SURGICAL HISTORY:   Past Surgical History:  Procedure Laterality Date   EYE SURGERY Right 1990   "reconstructive, lens implant- from paintball accident"   INGUINAL HERNIA REPAIR Bilateral first one in 823's  "I had one side done twice"   LEFT AND RIGHT HEART CATHETERIZATION WITH CORONARY ANGIOGRAM N/A 08/28/2013   Procedure: LEFT AND RIGHT HEART CATHETERIZATION WITH CORONARY ANGIOGRAM;  Surgeon: DLeonie Man  MD;  [facility];  Service: Cardiovascular;  Laterality: N/A;   MINIMALLY INVASIVE MAZE PROCEDURE N/A 05/15/2014   Procedure: MINIMALLY INVASIVE MAZE PROCEDURE;  Surgeon: CRexene Alberts MD;  [place] [place]  Service: Open Heart Surgery;  Laterality: N/A;   MITRAL VALVE REPLACEMENT Right 05/15/2014   Procedure: MINIMALLY INVASIVE MITRAL VALVE (MV) REPLACEMENT;  Surgeon: CRexene Alberts MD;  [place] [facility]  Service: Open Heart Surgery;  Laterality: Right;   PERMANENT PACEMAKER INSERTION Left 05/22/2014   a. MDT dual chamber PPM implanted by Dr ARayann Hemanfor CHB s/p MAZE/MVR   ROBOT ASSISTED LAPAROSCOPIC NEPHRECTOMY Right 09/05/2013   Procedure: ROBOTIC ASSISTED LAPAROSCOPIC RIGHT RADICAL NEPHRECTOMY;  Surgeon: DSharyn Creamer MD;  Location[facility];  Service: Urology;  Laterality: Right;   TEE WITHOUT CARDIOVERSION N/A 08/16/2013   Procedure: TRANSESOPHAGEAL ECHOCARDIOGRAM (TEE);  Surgeon: KDorothy Spark MD;  [place] [facility]  Service: Cardiovascular;  Laterality: N/A;   TEE WITHOUT CARDIOVERSION N/A 05/15/2014   Procedure: TRANSESOPHAGEAL ECHOCARDIOGRAM (TEE);  Surgeon: CRexene Alberts MD;  [place] [facility]  Service: Open Heart Surgery;  Laterality: N/A;    SOCIAL HISTORY:   Social History   Socioeconomic History   Marital status: Married    Spouse name: Not on file   Number of children: 2   Years of education: 16   Highest education level: Not on file  Occupational History   Occupation: Retired     Comment: SClinical research associatestrain: Not on file   Food insecurity    Worry: Not on file    Inability: Not on file   Transportation needs    Medical: Not on file    Non-medical: Not on file  Tobacco Use   Smoking status: Current Every Day Smoker  Packs/day: 0.75    Years: 50.00    Pack years: 37.50    Types: Cigarettes, Cigars   Smokeless tobacco: Never Used   Tobacco comment: Cigar every now and then  Substance and  Sexual Activity   Alcohol use: No    Alcohol/week: 0.0 standard drinks   Drug use: No   Sexual activity: Not Currently  Lifestyle   Physical activity    Days per week: Not on file    Minutes per session: Not on file   Stress: Not on file  Relationships   Social connections    Talks on phone: Not on file    Gets together: Not on file    Attends religious service: Not on file    Active member of club or organization: Not on file    Attends meetings of clubs or organizations: Not on file    Relationship status: Not on file   Intimate partner violence    Fear of current or ex partner: Not on file    Emotionally abused: Not on file    Physically abused: Not on file    Forced sexual activity: Not on file  Other Topics Concern   Not on file  Social History Narrative   Fun: Aggravate people.     FAMILY HISTORY:   Family Status  Relation Name Status   Mother  Deceased   Father  Alive   Other  (Not Specified)   Brother  52   Son  Alive   Daughter  Alive    ROS:  Review of Systems  Constitutional: Positive for malaise/fatigue.  HENT: Negative.   Eyes: Negative.   Respiratory: Negative.   Cardiovascular: Negative.   Gastrointestinal: Negative.   Genitourinary: Negative.   Skin: Negative.   Neurological: Positive for dizziness (occasional).    PHYSICAL EXAMINATION:     VITALS:   Vitals:   09/13/18 0852  BP: 122/70  Pulse: 79  Temp: 98.2 F (36.8 C)  SpO2: 98%  Weight: 216 lb 3.2 oz (98.1 kg)   Patient was undressed, placed into examining shorts and examined in gown for examination:  GEN:  Normal appears male in no acute distress.  Appears stated age. HEENT: Normocephalic, atraumatic.  Mucous membranes are moist. Cardiovascular: Regular rate and rhythm.  No tongue fasciculations. Lungs: Clear to auscultation bilaterally Neck/Heme: No bruits  NEUROLOGICAL: Orientation:  The patient is alert and oriented x3 Cranial nerves: There is good  facial symmetry.  There is minimal facial hypomimia.  The R pupil is irregular and nonreactive.  The L is regular and reactive.   Fundoscopic exam reveals clear disc margins bilaterally. Extraocular muscles are intact and visual fields are full to confrontational testing. Speech is fluent and more clear than last visit. Soft palate rises symmetrically and there is no tongue deviation. Hearing is decreased to conversational tone. Tone: none Sensation: Sensation is intact to light touch x 4 Coordination:  The patient exhibits decremation with all rapid alternating movements, including alternating supination and pronation of the forearm, hand opening and closing, finger taps, heel taps and toe taps. Motor: Strength is 5/5 in the bilateral upper and lower extremities.  Shoulder shrug is equal and symmetric. There is no pronator drift.  There are no fasciculations noted in the arms, legs, trunk (pt was undressed and placed in examining shorts today and shirt was off) DTR's: Deep tendon reflexes are 2/4 at the bilateral biceps, triceps, brachioradialis, 2+/4 at the bilateral patella and achilles.  Plantar response is  upgoing bilaterally (striatal toe) Gait and Station: The patient arises on the second attempt without the use of his hands.  He does not shuffle.  He does have decreased arm swing. Abnormal movements: none  Lab Results  Component Value Date   HGBA1C 6.0 01/10/2018      Chemistry      Component Value Date/Time   NA 139 03/09/2018 1113   NA 134 (A) 10/08/2016   NA 140 01/02/2016 0951   K 4.1 03/09/2018 1113   K 3.9 01/02/2016 0951   CL 102 03/09/2018 1113   CO2 24 03/09/2018 1113   CO2 22 01/02/2016 0951   BUN 18 03/09/2018 1113   BUN 19 10/08/2016   BUN 22.2 01/02/2016 0951   CREATININE 0.81 03/09/2018 1113   CREATININE 1.5 (H) 01/02/2016 0951   GLU 464 10/08/2016      Component Value Date/Time   CALCIUM 9.5 03/09/2018 1113   CALCIUM 10.5 (H) 01/02/2016 1139   CALCIUM 10.5  (H) 01/02/2016 0951   ALKPHOS 46 03/09/2018 1113   ALKPHOS 93 01/02/2016 0951   AST 23 03/09/2018 1113   AST 15 01/02/2016 0951   ALT 25 03/09/2018 1113   ALT 21 01/02/2016 0951   BILITOT 0.5 03/09/2018 1113   BILITOT 0.25 01/02/2016 0951     Lab Results  Component Value Date   TSH 2.03 03/09/2018   Lab Results  Component Value Date   VITAMINB12 372 08/22/2014   Lab Results  Component Value Date   WBC 11.7 (H) 03/09/2018   HGB 16.1 03/09/2018   HCT 47.9 03/09/2018   MCV 95.8 03/09/2018   PLT 248.0 03/09/2018      IMPRESSION/PLAN  1.  Falls  -Patient does look less parkinsonian and exhibits less neurodegenerative features than last visit.  Still with several falls since last visit.  He also has bilateral striatal/upgoing toes.  -DaTscan was negative.  -Unable to have MRI due to pacemaker.  -Do EMG.  -Labs:  B12 (had mild deficiency several years ago),  SPEP and UPEP with immunofixation, calcium, phosphorous, PTH, [place], Lyme, Heavy metal screen, AchR Ab  -if above unrevealing, will consider consult with Dr. Posey Pronto  -safety discussed in detail   2.  Tobacco abuse  -Currently smoking 0.5 packs/day    -Does not wish to quit.  3.  Follow-up will depend on the results of the above.  Much greater than 50% of this visit was spent in counseling and coordinating care.  Total face to face time:  25 min    Cc:  Libby Maw, MD

## 2018-09-13 ENCOUNTER — Other Ambulatory Visit: Payer: Self-pay

## 2018-09-13 ENCOUNTER — Telehealth: Payer: Self-pay

## 2018-09-13 ENCOUNTER — Other Ambulatory Visit (INDEPENDENT_AMBULATORY_CARE_PROVIDER_SITE_OTHER): Payer: PPO

## 2018-09-13 ENCOUNTER — Ambulatory Visit (INDEPENDENT_AMBULATORY_CARE_PROVIDER_SITE_OTHER): Payer: PPO | Admitting: Neurology

## 2018-09-13 ENCOUNTER — Encounter: Payer: Self-pay | Admitting: Neurology

## 2018-09-13 VITALS — BP 122/70 | HR 79 | Temp 98.2°F | Wt 216.2 lb

## 2018-09-13 DIAGNOSIS — Z5181 Encounter for therapeutic drug level monitoring: Secondary | ICD-10-CM

## 2018-09-13 DIAGNOSIS — W19XXXA Unspecified fall, initial encounter: Secondary | ICD-10-CM | POA: Diagnosis not present

## 2018-09-13 DIAGNOSIS — E538 Deficiency of other specified B group vitamins: Secondary | ICD-10-CM

## 2018-09-13 DIAGNOSIS — R531 Weakness: Secondary | ICD-10-CM | POA: Diagnosis not present

## 2018-09-13 DIAGNOSIS — G629 Polyneuropathy, unspecified: Secondary | ICD-10-CM

## 2018-09-13 LAB — VITAMIN B12: Vitamin B-12: 215 pg/mL (ref 211–911)

## 2018-09-13 NOTE — Telephone Encounter (Signed)

## 2018-09-13 NOTE — Telephone Encounter (Signed)
Spoke with patient he made aware of results. Will start OTC B-12

## 2018-09-13 NOTE — Telephone Encounter (Signed)
lmom for prescreen  

## 2018-09-13 NOTE — Telephone Encounter (Signed)
-----   Message from [place], DO sent at 09/13/2018  2:48 PM EDT ----- Let pt know that b12 is low.  Take oral b12 1063mcg daily OTC.

## 2018-09-13 NOTE — Patient Instructions (Addendum)
Your provider has requested that you have labwork completed today. Please go to [facility] ([phone]) on the second floor of this building before leaving the office today. You do not need to check in. If you are not called within 15 minutes please check with the front desk.   

## 2018-09-15 ENCOUNTER — Telehealth: Payer: Self-pay

## 2018-09-15 ENCOUNTER — Ambulatory Visit (INDEPENDENT_AMBULATORY_CARE_PROVIDER_SITE_OTHER): Payer: PPO | Admitting: Neurology

## 2018-09-15 ENCOUNTER — Other Ambulatory Visit: Payer: Self-pay

## 2018-09-15 DIAGNOSIS — G629 Polyneuropathy, unspecified: Secondary | ICD-10-CM

## 2018-09-15 DIAGNOSIS — R2681 Unsteadiness on feet: Secondary | ICD-10-CM

## 2018-09-15 NOTE — Telephone Encounter (Signed)
Called spoke with patient he was made aware and understands.

## 2018-09-15 NOTE — Procedures (Signed)
[facility]  [address]  [place], [place] [address] Tel: [phone] Fax:  [phone] Test Date:  09/15/2018  Patient: Kevin English DOB: 06-28-1955 Physician: Narda Amber, DO  Sex: Male Height: 5\' 8"  Ref Phys: Alonza Bogus, DO  ID#: [id number] Temp: 34.0C Technician:    Patient Complaints: This is a 63 year old man referred for evaluation of gait instability and falls.  NCV & EMG Findings: Extensive electrodiagnostic testing of the right upper and lower extremity shows:  1. All sensory responses including right median, ulnar, sural, and superficial peroneal nerves are within normal limits. 2. All motor responses including the right median, ulnar, peroneal, and tibial nerves are within normal limits. 3. Right tibial H reflex studies within normal limits. 4. There is no evidence of active or chronic motor axonal loss changes affecting any of the tested muscles.  Motor unit configuration and recruitment pattern is within normal limits.  Proximal and deep muscles were not tested as the patient is on anticoagulation therapy  Impression: This is a normal study of the right side.  In particular, there is no evidence of a sensorimotor polyneuropathy, cervical/lumbosacral radiculopathy, or widespread disorder of anterior horn cells.   ___________________________ Narda Amber, DO    Nerve Conduction Studies Anti Sensory Summary Table   Site NR Peak (ms) Norm Peak (ms) P-T Amp (V) Norm P-T Amp  Right Median Anti Sensory (2nd Digit)  34C  Wrist    3.5 <3.8 22.1 >10  Right Sup Peroneal Anti Sensory (Ant Lat Mall)  34C  12 cm    3.2 <4.6 3.7 >3  Right Sural Anti Sensory (Lat Mall)  34C  Calf    4.3 <4.6 3.2 >3  Right Ulnar Anti Sensory (5th Digit)  34C  Wrist    2.6 <3.2 15.0 >5   Motor Summary Table   Site NR Onset (ms) Norm Onset (ms) O-P Amp (mV) Norm O-P Amp Site1 Site2 Delta-0 (ms) Dist (cm) Vel (m/s) Norm Vel (m/s)  Right Median Motor (Abd Poll  Brev)  34C  Wrist    3.8 <4.0 7.1 >5 Elbow Wrist 5.5 28.0 51 >50  Elbow    9.3  7.0         Right Peroneal Motor (Ext Dig Brev)  34C  Ankle    3.3 <6.0 3.9 >2.5 B Fib Ankle 9.0 37.0 41 >40  B Fib    12.3  3.3  Poplt B Fib 1.5 8.0 53 >40  Poplt    13.8  3.3         Right Tibial Motor (Abd Hall Brev)  34C  Ankle    4.8 <6.0 5.3 >4 Knee Ankle 10.0 41.0 41 >40  Knee    14.8  3.2         Right Ulnar Motor (Abd Dig Minimi)  34C  Wrist    2.4 <3.1 7.8 >7 B Elbow Wrist 5.0 25.0 50 >50  B Elbow    7.4  7.0  A Elbow B Elbow 1.9 10.0 53 >50  A Elbow    9.3  6.7          H Reflex Studies   NR H-Lat (ms) Lat Norm (ms) L-R H-Lat (ms)  Right Tibial (Gastroc)  34C     34.56 <35    EMG   Side Muscle Ins Act Fibs Psw Fasc Number Recrt Dur Dur. Amp Amp. Poly Poly. Comment  Right 1stDorInt Nml Nml Nml Nml Nml Nml Nml Nml Nml Nml  Nml Nml N/A  Right PronatorTeres Nml Nml Nml Nml Nml Nml Nml Nml Nml Nml Nml Nml N/A  Right Biceps Nml Nml Nml Nml Nml Nml Nml Nml Nml Nml Nml Nml N/A  Right Triceps Nml Nml Nml Nml Nml Nml Nml Nml Nml Nml Nml Nml N/A  Right Deltoid Nml Nml Nml Nml Nml Nml Nml Nml Nml Nml Nml Nml N/A  Right AntTibialis Nml Nml Nml Nml Nml Nml Nml Nml Nml Nml Nml Nml N/A  Right Gastroc Nml Nml Nml Nml Nml Nml Nml Nml Nml Nml Nml Nml N/A  Right RectFemoris Nml Nml Nml Nml Nml Nml Nml Nml Nml Nml Nml Nml N/A      Waveforms:

## 2018-09-15 NOTE — Telephone Encounter (Signed)
-----   Message from [place], DO sent at 09/15/2018 12:04 PM EDT ----- Let pt know that EMG test looked good.  Awaiting rest of labs.  If neg, I may have him get a second opinion with Dr. Posey Pronto

## 2018-09-16 ENCOUNTER — Telehealth: Payer: Self-pay

## 2018-09-16 LAB — HEAVY METALS PROFILE II, BLOOD
Arsenic: 6 ug/L (ref 2–23)
Cadmium: 1.1 ug/L (ref 0.0–1.2)
Lead, Blood: 1 ug/dL (ref 0–4)
Mercury: NOT DETECTED ug/L (ref 0.0–14.9)

## 2018-09-16 LAB — IMMUNOFIXATION ELECTROPHORESIS
IgG (Immunoglobin G), Serum: 1073 mg/dL (ref 600–1540)
IgM, Serum: 264 mg/dL (ref 50–300)
Immunoglobulin A: 251 mg/dL (ref 70–320)

## 2018-09-16 LAB — ACETYLCHOLINE RECEPTOR, BINDING: AChR Binding Ab, Serum: 0.04 nmol/L (ref 0.00–0.24)

## 2018-09-16 LAB — IMMUNOFIXATION INTE

## 2018-09-16 LAB — PTH, INTACT AND CALCIUM
Calcium: 10.1 mg/dL (ref 8.6–10.3)
PTH: 40 pg/mL (ref 14–64)

## 2018-09-16 LAB — IMMUNOFIXATION, URINE

## 2018-09-16 LAB — CERULOPLASMIN: Ceruloplasmin: 33 mg/dL (ref 18–36)

## 2018-09-16 LAB — STRIATED MUSCLE ANTIBODY: Anti-striation Abs: NEGATIVE

## 2018-09-16 LAB — SPECIMEN STATUS REPORT

## 2018-09-16 NOTE — Telephone Encounter (Signed)
-----   Message from [place], DO sent at 09/16/2018 12:15 PM EDT ----- Let pt know that labs looked good.  Front staff will be calling him to set up 2nd opinion appt with Dr. Posey Pronto

## 2018-09-16 NOTE — Telephone Encounter (Signed)
Called spoke with patient he was made aware and understands results. appt with Dr. Posey Pronto scheduled for 10/31/18

## 2018-09-20 ENCOUNTER — Other Ambulatory Visit: Payer: Self-pay

## 2018-09-20 ENCOUNTER — Ambulatory Visit (INDEPENDENT_AMBULATORY_CARE_PROVIDER_SITE_OTHER): Payer: PPO | Admitting: *Deleted

## 2018-09-20 ENCOUNTER — Other Ambulatory Visit: Payer: Self-pay | Admitting: Physician Assistant

## 2018-09-20 DIAGNOSIS — Z954 Presence of other heart-valve replacement: Secondary | ICD-10-CM | POA: Diagnosis not present

## 2018-09-20 DIAGNOSIS — I050 Rheumatic mitral stenosis: Secondary | ICD-10-CM | POA: Diagnosis not present

## 2018-09-20 DIAGNOSIS — Z0000 Encounter for general adult medical examination without abnormal findings: Secondary | ICD-10-CM | POA: Diagnosis not present

## 2018-09-20 DIAGNOSIS — I4891 Unspecified atrial fibrillation: Secondary | ICD-10-CM

## 2018-09-20 DIAGNOSIS — I5033 Acute on chronic diastolic (congestive) heart failure: Secondary | ICD-10-CM | POA: Diagnosis not present

## 2018-09-20 DIAGNOSIS — Z5181 Encounter for therapeutic drug level monitoring: Secondary | ICD-10-CM

## 2018-09-20 LAB — POCT INR: INR: 3.1 — AB (ref 2.0–3.0)

## 2018-09-20 NOTE — Patient Instructions (Signed)
Description   Today only take 1/2 tablet, then continue taking 1.5 tablets daily except 1 tablet on Monday, Wednesday, and Friday. Recheck in 4 weeks in the [place] office.  Call [phone] with any questions.

## 2018-09-22 ENCOUNTER — Other Ambulatory Visit: Payer: Self-pay | Admitting: Family Medicine

## 2018-09-22 MED ORDER — GLUCOSE BLOOD VI STRP: ORAL_STRIP | 12 refills | Status: DC

## 2018-09-22 MED ORDER — ONETOUCH SURESOFT LANCING DEV MISC: 1 refills | Status: DC

## 2018-09-22 NOTE — Telephone Encounter (Signed)
Rx's sent in. °

## 2018-09-22 NOTE — Telephone Encounter (Signed)
Medication Refill - Medication: Lancets Misc. (ONE TOUCH SURESOFT) MISC [[id number]]   glucose blood (ONE TOUCH ULTRA TEST) test strip [[id number]]   Has the patient contacted their pharmacy? No. (Agent: If no, request that the patient contact the pharmacy for the refill.) The patient is completely out of these medications and needs new prescriptions   Preferred Pharmacy (with phone number or street name):  [id number] - [place], [address] [phone] (Phone) [phone] (Fax)     Agent: Please be advised that RX refills may take up to 3 business days. We ask that you follow-up with your pharmacy.

## 2018-10-12 ENCOUNTER — Ambulatory Visit (INDEPENDENT_AMBULATORY_CARE_PROVIDER_SITE_OTHER): Payer: PPO | Admitting: *Deleted

## 2018-10-12 DIAGNOSIS — I442 Atrioventricular block, complete: Secondary | ICD-10-CM | POA: Diagnosis not present

## 2018-10-12 DIAGNOSIS — I4891 Unspecified atrial fibrillation: Secondary | ICD-10-CM

## 2018-10-12 LAB — CUP PACEART REMOTE DEVICE CHECK
Battery Impedance: 183 Ohm
Battery Remaining Longevity: 128 mo
Battery Voltage: 2.78 V
Brady Statistic AP VP Percent: 0 %
Brady Statistic AP VS Percent: 57 %
Brady Statistic AS VP Percent: 0 %
Brady Statistic AS VS Percent: 42 %
Date Time Interrogation Session: 20200722121358
Implantable Lead Implant Date: 20160301
Implantable Lead Implant Date: 20160301
Implantable Lead Location: 753859
Implantable Lead Location: 753860
Implantable Lead Model: 5076
Implantable Lead Model: 5076
Implantable Pulse Generator Implant Date: 20160301
Lead Channel Impedance Value: 442 Ohm
Lead Channel Impedance Value: 688 Ohm
Lead Channel Pacing Threshold Amplitude: 0.5 V
Lead Channel Pacing Threshold Amplitude: 0.625 V
Lead Channel Pacing Threshold Pulse Width: 0.4 ms
Lead Channel Pacing Threshold Pulse Width: 0.4 ms
Lead Channel Setting Pacing Amplitude: 2 V
Lead Channel Setting Pacing Amplitude: 2.5 V
Lead Channel Setting Pacing Pulse Width: 0.4 ms
Lead Channel Setting Sensing Sensitivity: 5.6 mV

## 2018-10-18 ENCOUNTER — Other Ambulatory Visit: Payer: Self-pay

## 2018-10-18 ENCOUNTER — Ambulatory Visit (INDEPENDENT_AMBULATORY_CARE_PROVIDER_SITE_OTHER): Payer: PPO | Admitting: *Deleted

## 2018-10-18 DIAGNOSIS — I5033 Acute on chronic diastolic (congestive) heart failure: Secondary | ICD-10-CM

## 2018-10-18 DIAGNOSIS — I050 Rheumatic mitral stenosis: Secondary | ICD-10-CM

## 2018-10-18 DIAGNOSIS — Z0000 Encounter for general adult medical examination without abnormal findings: Secondary | ICD-10-CM | POA: Diagnosis not present

## 2018-10-18 DIAGNOSIS — Z954 Presence of other heart-valve replacement: Secondary | ICD-10-CM

## 2018-10-18 DIAGNOSIS — Z5181 Encounter for therapeutic drug level monitoring: Secondary | ICD-10-CM

## 2018-10-18 DIAGNOSIS — I4891 Unspecified atrial fibrillation: Secondary | ICD-10-CM

## 2018-10-18 LAB — POCT INR: INR: 3.6 — AB (ref 2.0–3.0)

## 2018-10-18 NOTE — Patient Instructions (Signed)
Description   Today only take 1 tablet, then start taking 1 tablets daily except 1.5 tablets on Tuesdays, Thursdays and Sundays. Recheck in 2 weeks in the [place] office.  Call [phone] with any questions.

## 2018-10-19 DIAGNOSIS — N183 Chronic kidney disease, stage 3 (moderate): Secondary | ICD-10-CM | POA: Diagnosis not present

## 2018-10-20 DIAGNOSIS — G4733 Obstructive sleep apnea (adult) (pediatric): Secondary | ICD-10-CM | POA: Diagnosis not present

## 2018-10-22 ENCOUNTER — Other Ambulatory Visit: Payer: Self-pay | Admitting: Family Medicine

## 2018-10-22 DIAGNOSIS — R252 Cramp and spasm: Secondary | ICD-10-CM

## 2018-10-24 NOTE — Telephone Encounter (Signed)
WK-It has been quite some time since this pt has been seen/plz advise/thx dmf

## 2018-10-26 DIAGNOSIS — I4891 Unspecified atrial fibrillation: Secondary | ICD-10-CM | POA: Diagnosis not present

## 2018-10-26 DIAGNOSIS — D751 Secondary polycythemia: Secondary | ICD-10-CM | POA: Diagnosis not present

## 2018-10-26 DIAGNOSIS — F17201 Nicotine dependence, unspecified, in remission: Secondary | ICD-10-CM | POA: Diagnosis not present

## 2018-10-26 DIAGNOSIS — Z8679 Personal history of other diseases of the circulatory system: Secondary | ICD-10-CM | POA: Diagnosis not present

## 2018-10-26 DIAGNOSIS — I129 Hypertensive chronic kidney disease with stage 1 through stage 4 chronic kidney disease, or unspecified chronic kidney disease: Secondary | ICD-10-CM | POA: Diagnosis not present

## 2018-10-26 DIAGNOSIS — I509 Heart failure, unspecified: Secondary | ICD-10-CM | POA: Diagnosis not present

## 2018-10-26 DIAGNOSIS — E1122 Type 2 diabetes mellitus with diabetic chronic kidney disease: Secondary | ICD-10-CM | POA: Diagnosis not present

## 2018-10-26 DIAGNOSIS — N183 Chronic kidney disease, stage 3 (moderate): Secondary | ICD-10-CM | POA: Diagnosis not present

## 2018-10-26 DIAGNOSIS — C649 Malignant neoplasm of unspecified kidney, except renal pelvis: Secondary | ICD-10-CM | POA: Diagnosis not present

## 2018-10-26 DIAGNOSIS — Z952 Presence of prosthetic heart valve: Secondary | ICD-10-CM | POA: Diagnosis not present

## 2018-10-26 DIAGNOSIS — N179 Acute kidney failure, unspecified: Secondary | ICD-10-CM | POA: Diagnosis not present

## 2018-10-26 NOTE — Progress Notes (Signed)
Remote pacemaker transmission.   

## 2018-10-27 ENCOUNTER — Encounter: Payer: Self-pay | Admitting: Neurology

## 2018-10-31 ENCOUNTER — Ambulatory Visit (INDEPENDENT_AMBULATORY_CARE_PROVIDER_SITE_OTHER): Payer: PPO

## 2018-10-31 ENCOUNTER — Encounter: Payer: Self-pay | Admitting: Neurology

## 2018-10-31 ENCOUNTER — Ambulatory Visit (INDEPENDENT_AMBULATORY_CARE_PROVIDER_SITE_OTHER): Payer: PPO | Admitting: Neurology

## 2018-10-31 ENCOUNTER — Other Ambulatory Visit: Payer: Self-pay

## 2018-10-31 VITALS — BP 144/85 | HR 83 | Temp 98.0°F | Ht 68.0 in | Wt 217.0 lb

## 2018-10-31 DIAGNOSIS — I050 Rheumatic mitral stenosis: Secondary | ICD-10-CM

## 2018-10-31 DIAGNOSIS — F332 Major depressive disorder, recurrent severe without psychotic features: Secondary | ICD-10-CM | POA: Diagnosis not present

## 2018-10-31 DIAGNOSIS — Z954 Presence of other heart-valve replacement: Secondary | ICD-10-CM | POA: Diagnosis not present

## 2018-10-31 DIAGNOSIS — Z0000 Encounter for general adult medical examination without abnormal findings: Secondary | ICD-10-CM | POA: Diagnosis not present

## 2018-10-31 DIAGNOSIS — I5033 Acute on chronic diastolic (congestive) heart failure: Secondary | ICD-10-CM | POA: Diagnosis not present

## 2018-10-31 DIAGNOSIS — R292 Abnormal reflex: Secondary | ICD-10-CM

## 2018-10-31 DIAGNOSIS — R2681 Unsteadiness on feet: Secondary | ICD-10-CM | POA: Diagnosis not present

## 2018-10-31 DIAGNOSIS — I4891 Unspecified atrial fibrillation: Secondary | ICD-10-CM

## 2018-10-31 DIAGNOSIS — Z5181 Encounter for therapeutic drug level monitoring: Secondary | ICD-10-CM | POA: Diagnosis not present

## 2018-10-31 LAB — POCT INR: INR: 2.1 (ref 2.0–3.0)

## 2018-10-31 NOTE — Patient Instructions (Signed)
Description   Take 1.5 tablets today, then start taking 1.5 tablets daily except 1 tablet on Mondays, Wednesdays and Fridays.  Recheck in 3 weeks in the [place] office.  Call [phone] with any questions.

## 2018-10-31 NOTE — Progress Notes (Signed)
[facility] Clinic Note - Initial Visit   Date: 10/31/18  Kevin English MRN: [id number] DOB: 1956/02/17   Dear Dr. Carles Collet:  Thank you for your kind referral of Kevin English for consultation of gait difficulty. Although his history is well known to you, please allow [place] to reiterate it for the purpose of our medical record. The patient was accompanied to the clinic by wife who also provides collateral information.     History of Present Illness: Kevin English is a 63 y.o. right-handed male referred for second opinion for neuromuscular evaluation for gait difficulty.   Patient does not actively engage in conversation, but will answer appropriately when direct questions are asked, therefore, most of the history is obtained from his wife.  He has had a long history of gait difficulty and has been evaluated by Dr. Erlinda Hong in 2017 and most recently by Dr. Carles Collet.  Work-up has included lab testing, CT head, DAT scan, and NCS/EMG which has been non-diagnostic.  He endorses generalized weakness and lack of motivation to do anything.  He has some imbalance and has fallen a few times, but always able to stand up by himself and has not sustained any injury.  There is no numbness/tingling or loss of consciousness.  He does not use a cane and has not done physical therapy.    His wife says that his current state has been ongoing for many years.  She feels that he is lazy because he can sleep all day, up to 20 hours of the day.  Upon further questioning, patient reports having severe depressed mood, lack of interest, no motivation to do anything, and has no hobbies.  He used to mow the yard last year, but does not engage in anything now.  He feels unhappy. He is very sedentary and does not engage in any activity anymore.  He is able to do basic ADLs such as bathing, dressing, and feeding.  Wife does all household chores, manages medications and finances.  He does not drive. He has seen a  psychologist in the past, but did not continue to see them.  It is very difficult for his wife to get him to see a doctor, counselor, or engage in physical therapy.   Out-side paper records, electronic medical record, and images have been reviewed where available and summarized as:  NCS/EMG of the right side 09/15/2018:  This is a normal study of the right side.  In particular, there is no evidence of a sensorimotor polyneuropathy, cervical/lumbosacral radiculopathy, or widespread disorder of anterior horn cells.  Lab Results  Component Value Date   HGBA1C 6.0 01/10/2018   Lab Results  Component Value Date   VITAMINB12 215 09/13/2018   Lab Results  Component Value Date   TSH 2.03 03/09/2018   Lab Results  Component Value Date   ESRSEDRATE 2 03/05/2016    Past Medical History:  Diagnosis Date  . Anxiety   . Borderline diabetes   . CAD (coronary artery disease)    a. By cath 08/2013: 70% distal cx-prox LPDA; tandem mod LAD lesions, o/w mild dz.  . CKD (chronic kidney disease), stage II    his creat. runs 2-2.5; hasnt seen neph yet - sees [facility] Tresa [facility]  . Complete heart block (HCC)    a. s/p PPM   . Deafness in left ear   . Depression   . Diabetes mellitus without complication ([place])   . Diverticulitis of colon 15 years  ago  . Hernia   . HTN (hypertension)   . Hyperlipidemia   . Hypogonadism male   . Hypothyroidism   . Lung nodule seen on imaging study 04/10/2014   5 mm nodule right lower lobe  . Mitral stenosis    a. Dx 07/2013 - through workup of 2D echo, TEE, and cath, felt to be moderate.  . Obesity   . OSA on CPAP   . Persistent atrial fibrillation    a. sustained 160-180 on e-CARDIO monitor placed 07/29/2013. Placed on IV amiodarone at end of May 2015 due to continued paroxysms with RVR.  Marland Kitchen Renal cell carcinoma of right kidney ([place]) 09/05/2013   clear cell renal cell carcinoma of right kidney treated by radical nephrectomy, Fuhrman grade 2-3, with maximum tumor  diameter 2.7 cm, all tumor to find confined to the kidney, and all surgical margins negative (T1aN0).   . Renal mass    a. Dx 08/2013: concerning for renal cancer.  . Rheumatic heart disease   . S/P Minimally invasive maze operation for atrial fibrillation 05/15/2014   Left side lesion set using cryothermy with clipping of LA appendage  . S/P minimally invasive mitral valve replacement with metallic valve and maze procedure 05/15/2014   21m Sorin Carbomedics Optiform mechanical valve placed via right mini thoracotomy approach  . Stroke ([facility] 1998   "MRI showed 3 mild strokes"    Past Surgical History:  Procedure Laterality Date  . EYE SURGERY Right 1990   "reconstructive, lens implant- from paintball accident"  . INGUINAL HERNIA REPAIR Bilateral first one in 80's   "I had one side done twice"  . LEFT AND RIGHT HEART CATHETERIZATION WITH CORONARY ANGIOGRAM N/A 08/28/2013   Procedure: LEFT AND RIGHT HEART CATHETERIZATION WITH CORONARY ANGIOGRAM;  Surgeon: DLeonie Man MD;  [facility];  Service: Cardiovascular;  Laterality: N/A;  . MINIMALLY INVASIVE MAZE PROCEDURE N/A 05/15/2014   Procedure: MINIMALLY INVASIVE MAZE PROCEDURE;  Surgeon: CRexene Alberts MD;  [place] [facility]  Service: Open Heart Surgery;  Laterality: N/A;  . MITRAL VALVE REPLACEMENT Right 05/15/2014   Procedure: MINIMALLY INVASIVE MITRAL VALVE (MV) REPLACEMENT;  Surgeon: CRexene Alberts MD;  [place]  Service: Open Heart Surgery;  Laterality: Right;  . PERMANENT PACEMAKER INSERTION Left 05/22/2014   a. MDT dual chamber PPM implanted by Dr ARayann Hemanfor CHB s/p MAZE/MVR  . ROBOT ASSISTED LAPAROSCOPIC NEPHRECTOMY Right 09/05/2013   Procedure: ROBOTIC ASSISTED LAPAROSCOPIC RIGHT RADICAL NEPHRECTOMY;  Surgeon: DSharyn Creamer MD;  Location: [facility];  Service: Urology;  Laterality: Right;  . TEE WITHOUT CARDIOVERSION N/A 08/16/2013   Procedure: TRANSESOPHAGEAL ECHOCARDIOGRAM (TEE);  Surgeon: KDorothy Spark MD;   [place]  Service: Cardiovascular;  Laterality: N/A;  . TEE WITHOUT CARDIOVERSION N/A 05/15/2014   Procedure: TRANSESOPHAGEAL ECHOCARDIOGRAM (TEE);  Surgeon: CRexene Alberts MD;  [place]  Service: Open Heart Surgery;  Laterality: N/A;     Medications:  Outpatient Encounter Medications as of 10/31/2018  Medication Sig  . acetaminophen (TYLENOL) 325 MG tablet Take 325 mg by mouth every 6 (six) hours as needed (pain).   .Marland KitchenamLODipine (NORVASC) 10 MG tablet TAKE 1 TABLET (10 MG TOTAL) BY MOUTH DAILY.  .Marland Kitchenaspirin EC 81 MG tablet Take 1 tablet (81 mg total) by mouth daily.  . Blood Glucose Monitoring Suppl (ONE TOUCH ULTRA MINI) w/Device KIT Use meter to check blood sugars 1-4 times daily as instructed.  .Marland KitchenbuPROPion (WELLBUTRIN) 75 MG tablet  TAKE 1 TABLET BY MOUTH EACH MORNING AND A SECOND PILL MID AFTERNOON.  Marland Kitchen FLUoxetine (PROZAC) 20 MG tablet Take 1 tablet (20 mg total) by mouth daily.  Marland Kitchen glucose blood (ONE TOUCH ULTRA TEST) test strip Use one strip per test. Test blood sugars 1-4 times daily as instructed.  . Lancets (ONETOUCH DELICA PLUS [id number]) MISC USE 1 LANCET PER TEST. TEST BLOOD SUGARS 1 4 TIMES PER DAY AS INSTRUCTED.  . Lancets Misc. (ONE TOUCH SURESOFT) MISC Use 1 lancet per test. Test blood sugars 1-4 times per day as instructed.  Marland Kitchen levothyroxine (SYNTHROID, LEVOTHROID) 75 MCG tablet Take 1 tablet (75 mcg total) by mouth daily before breakfast.  . loratadine (CLARITIN) 10 MG tablet Take 1 tablet (10 mg total) by mouth daily.  Marland Kitchen MELATONIN GUMMIES PO Take by mouth. Pt states taking 2 tablets 20 mg at night but making him more sleepy so taking 1 at night  . metFORMIN (GLUCOPHAGE) 500 MG tablet TAKE 1 TABLET (500 MG TOTAL) BY MOUTH 2 (TWO) TIMES DAILY WITH A MEAL.  . metoprolol tartrate (LOPRESSOR) 25 MG tablet Take 0.5 tablets (12.5 mg total) by mouth 2 (two) times daily.  Marland Kitchen omega-3 acid ethyl esters (LOVAZA) 1 g capsule TAKE 2 CAPSULES (2 G TOTAL) BY MOUTH 2 (TWO)  TIMES DAILY.  Marland Kitchen warfarin (COUMADIN) 5 MG tablet TAKE AS DIRECTED BY ANTICOAGULATION CLINIC  . [DISCONTINUED] gabapentin (NEURONTIN) 400 MG capsule TAKE 1 CAPSULE (400 MG TOTAL) BY MOUTH AT BEDTIME.   No facility-administered encounter medications on file as of 10/31/2018.     Allergies:  Allergies  Allergen Reactions  . Contrast Media [Iodinated Diagnostic Agents] Hives and Other (See Comments)    Patient started sneezing and coughing, patient broke out in hives, patient needs 13 hour prep if having IV contrast  . Ioxaglate Hives and Rash    Patient started sneezing and coughing, patient broke out in hives, patient needs 13 hour prep if having IV contrast  . Atorvastatin Other (See Comments)    Myalgias  . Crestor [Rosuvastatin] Other (See Comments)    Myalgias  . Fenofibrate Other (See Comments)    MYALGIAS   . Pravastatin Other (See Comments)    Myalgias   . Pseudoephedrine Hcl Palpitations    Family History: Family History  Problem Relation Age of Onset  . Hypertension Mother   . Heart attack Mother   . Stroke Mother   . Heart disease Father   . Hypertension Other   . Heart disease Brother   . Diabetes Brother   . Healthy Son   . Healthy Daughter     Social History: Social History   Tobacco Use  . Smoking status: Current Every Day Smoker    Packs/day: 0.75    Years: 50.00    Pack years: 37.50    Types: Cigarettes, Cigars  . Smokeless tobacco: Never Used  . Tobacco comment: Cigar every now and then  Substance Use Topics  . Alcohol use: No    Alcohol/week: 0.0 standard drinks  . Drug use: No   Social History   Social History Narrative   Fun: [employer]rav[employer] people.    Right hand    One story home    Review of Systems:  CONSTITUTIONAL: No fevers, chills, night sweats, or weight loss.   EYES: No visual changes or eye pain ENT: No hearing changes.  No history of nose bleeds.   RESPIRATORY: No cough, wheezing and shortness of breath.   CARDIOVASCULAR:  Negative for  chest pain, and palpitations.   GI: Negative for abdominal discomfort, blood in stools or black stools.  No recent change in bowel habits.   GU:  No history of incontinence.   MUSCLOSKELETAL: No history of joint pain or swelling.  No myalgias.   SKIN: Negative for lesions, rash, and itching.   HEMATOLOGY/ONCOLOGY: Negative for prolonged bleeding, bruising easily, and swollen nodes.  No history of cancer.   ENDOCRINE: Negative for cold or heat intolerance, polydipsia or goiter.   PSYCH:  ++depression or anxiety symptoms.   NEURO: As Above.   Vital Signs:  BP (!) 144/85   Pulse 83   Temp 98 F (36.7 C)   Ht '5\' 8"'$  (1.727 m)   Wt 217 lb (98.4 kg)   SpO2 96%   BMI 32.99 kg/m    General Medical Exam:   General:  Depressed appearing, comfortable.  Blunted affect. Eyes/ENT: see cranial nerve examination.   Neck:   No carotid bruits. Respiratory:  Clear to auscultation, good air entry bilaterally.   Cardiac:  Regular rate and rhythm, no murmur.   Extremities:  No deformities, edema, or skin discoloration.  Skin:  No rashes or lesions.  Neurological Exam: MENTAL STATUS including orientation to time, place, person is normal.  Attention is good.  Concentration is poor. Paucity of speech, no dysarthria  CRANIAL NERVES: II:  No visual field defects.  III-IV-VI: Pupils equal round and reactive to light.  Normal conjugate, extra-ocular eye movements in all directions of gaze.  No nystagmus.  No ptosis at rest or with sustained upgaze.   V:  Normal facial sensation.    VII:  Normal facial symmetry and movements.   VIII:  Normal hearing and vestibular function.   IX-X:  Normal palatal movement.   XI:  Normal shoulder shrug and head rotation.   XII:  Normal tongue strength and range of motion, no deviation or fasciculation.  MOTOR:  No atrophy, fasciculations or abnormal movements.  No pronator drift.   Upper Extremity:  Right  Left  Deltoid  5/5   5/5   Biceps  5/5   5/5    Triceps  5/5   5/5   Infraspinatus 5/5  5/5  Medial pectoralis 5/5  5/5  Wrist extensors  5/5   5/5   Wrist flexors  5/5   5/5   Finger extensors  5/5   5/5   Finger flexors  5/5   5/5   Dorsal interossei  5/5   5/5   Abductor pollicis  5/5   5/5   Tone (Ashworth scale)  0  0   Lower Extremity:  Right  Left  Hip flexors  5/5   5/5   Hip extensors  5/5   5/5   Adductor 5/5  5/5  Abductor 5/5  5/5  Knee flexors  5/5   5/5   Knee extensors  5/5   5/5   Dorsiflexors  5/5   5/5   Plantarflexors  5/5   5/5   Toe extensors  5/5   5/5   Toe flexors  5/5   5/5   Tone (Ashworth scale)  0  0   MSRs:  Right        Left                  brachioradialis 3+  2+  biceps 3+  2+  triceps 2+  2+  patellar 2+  2+  ankle jerk 2+  2+  Hoffman  no  no  plantar response down  down   SENSORY:  Normal and symmetric perception of light touch, pinprick, vibration, and proprioception.  Romberg's sign absent.   COORDINATION/GAIT: Normal finger-to- nose-finger and heel-to-shin.  Intact rapid alternating movements bilaterally.  Able to rise from a chair without using arms.  Gait mildly wide-based and stable with poor arm swing bilaterally. He turns with 3-4 steps.  Tandem and stressed gait intact.    IMPRESSION: Mr. Summerson is a 63 year-old man referred for evaluation of gait instability and falls.  Overall, I do not appreciate anything on his exam or electrodiagnostic testing which suggests he has a primary neuromuscular disorder.  He has asymmetric right arm hyperreflexia with normal tone and motor strength, this may be due to cervical canal disease and I will order CT cervical spine.  I offered physical therapy for gait training which he declined.  He is very sedentary and I urged him to try to start daily walking regimen.   More importantly, there is a huge overlay of severe depression which patient endorses.  I explained that untreated depression can have somatic manifestation and it is important that  he seek the appropriate care for this.  He will discuss with his PCP about seeing a psychiatrist.      Greater than 50% of this 45 minute visit was spent in counseling, explanation of diagnosis, planning of further management, and coordination of care.  Thank you for allowing me to participate in patient's care.  If I can answer any additional questions, I would be pleased to do so.    Sincerely,    Donika K. Posey Pronto, DO

## 2018-10-31 NOTE — Patient Instructions (Signed)
CT cervical spine without.  We will call you with the results

## 2018-11-08 ENCOUNTER — Other Ambulatory Visit: Payer: Self-pay

## 2018-11-08 ENCOUNTER — Ambulatory Visit
Admission: RE | Admit: 2018-11-08 | Discharge: 2018-11-08 | Disposition: A | Discharge status: Home / Self Care | Payer: PPO | Source: Ambulatory Visit | Attending: Neurology | Admitting: Neurology

## 2018-11-08 DIAGNOSIS — M4802 Spinal stenosis, cervical region: Secondary | ICD-10-CM | POA: Diagnosis not present

## 2018-11-08 DIAGNOSIS — R2681 Unsteadiness on feet: Secondary | ICD-10-CM

## 2018-11-08 DIAGNOSIS — M4722 Other spondylosis with radiculopathy, cervical region: Secondary | ICD-10-CM | POA: Diagnosis not present

## 2018-11-08 DIAGNOSIS — R292 Abnormal reflex: Secondary | ICD-10-CM

## 2018-11-08 DIAGNOSIS — M50121 Cervical disc disorder at C4-C5 level with radiculopathy: Secondary | ICD-10-CM | POA: Diagnosis not present

## 2018-11-09 ENCOUNTER — Telehealth: Payer: Self-pay

## 2018-11-09 NOTE — Telephone Encounter (Signed)
-----   Message from Malcolm, DO sent at 11/09/2018 11:46 AM EDT ----- Let pt know that while he has some degenerative changes in the neck and some pinching of the nerves, there is nothing to explain his symptoms from a neurologic standpoint seen on this CT

## 2018-11-09 NOTE — Telephone Encounter (Signed)
Spoke with patient and advised on report, to follow up with cardiology on sept 1 as scheduled.

## 2018-11-11 ENCOUNTER — Encounter: Payer: Self-pay | Admitting: Family Medicine

## 2018-11-11 ENCOUNTER — Ambulatory Visit (INDEPENDENT_AMBULATORY_CARE_PROVIDER_SITE_OTHER): Payer: PPO | Admitting: Family Medicine

## 2018-11-11 ENCOUNTER — Other Ambulatory Visit: Payer: Self-pay

## 2018-11-11 VITALS — BP 128/80 | HR 95 | Ht 68.0 in | Wt 218.4 lb

## 2018-11-11 DIAGNOSIS — J4541 Moderate persistent asthma with (acute) exacerbation: Secondary | ICD-10-CM | POA: Diagnosis not present

## 2018-11-11 DIAGNOSIS — E559 Vitamin D deficiency, unspecified: Secondary | ICD-10-CM | POA: Diagnosis not present

## 2018-11-11 DIAGNOSIS — F322 Major depressive disorder, single episode, severe without psychotic features: Secondary | ICD-10-CM

## 2018-11-11 DIAGNOSIS — N183 Chronic kidney disease, stage 3 unspecified: Secondary | ICD-10-CM

## 2018-11-11 DIAGNOSIS — J301 Allergic rhinitis due to pollen: Secondary | ICD-10-CM

## 2018-11-11 DIAGNOSIS — E1122 Type 2 diabetes mellitus with diabetic chronic kidney disease: Secondary | ICD-10-CM

## 2018-11-11 DIAGNOSIS — Z0000 Encounter for general adult medical examination without abnormal findings: Secondary | ICD-10-CM | POA: Diagnosis not present

## 2018-11-11 DIAGNOSIS — N182 Chronic kidney disease, stage 2 (mild): Secondary | ICD-10-CM

## 2018-11-11 DIAGNOSIS — E039 Hypothyroidism, unspecified: Secondary | ICD-10-CM | POA: Diagnosis not present

## 2018-11-11 LAB — PSA: PSA: 0.72 ng/mL (ref 0.10–4.00)

## 2018-11-11 LAB — URINALYSIS, ROUTINE W REFLEX MICROSCOPIC
Bilirubin Urine: NEGATIVE
Ketones, ur: NEGATIVE
Leukocytes,Ua: NEGATIVE
Nitrite: NEGATIVE
Specific Gravity, Urine: 1.02 (ref 1.000–1.030)
Total Protein, Urine: NEGATIVE
Urine Glucose: NEGATIVE
Urobilinogen, UA: 0.2 (ref 0.0–1.0)
pH: 6 (ref 5.0–8.0)

## 2018-11-11 LAB — HEMOGLOBIN A1C: Hgb A1c MFr Bld: 8.1 % — ABNORMAL HIGH (ref 4.6–6.5)

## 2018-11-11 LAB — COMPREHENSIVE METABOLIC PANEL
ALT: 53 U/L (ref 0–53)
AST: 26 U/L (ref 0–37)
Albumin: 4.3 g/dL (ref 3.5–5.2)
Alkaline Phosphatase: 79 U/L (ref 39–117)
BUN: 22 mg/dL (ref 6–23)
CO2: 22 mEq/L (ref 19–32)
Calcium: 9.7 mg/dL (ref 8.4–10.5)
Chloride: 105 mEq/L (ref 96–112)
Creatinine, Ser: 1.58 mg/dL — ABNORMAL HIGH (ref 0.40–1.50)
GFR: 44.17 mL/min — ABNORMAL LOW (ref >60.00)
Glucose, Bld: 215 mg/dL — ABNORMAL HIGH (ref 70–99)
Potassium: 4.4 mEq/L (ref 3.5–5.1)
Sodium: 136 mEq/L (ref 135–145)
Total Bilirubin: 0.4 mg/dL (ref 0.2–1.2)
Total Protein: 6.7 g/dL (ref 6.0–8.3)

## 2018-11-11 LAB — CBC
HCT: 46.6 % (ref 39.0–52.0)
Hemoglobin: 15.5 g/dL (ref 13.0–17.0)
MCHC: 33.2 g/dL (ref 30.0–36.0)
MCV: 96.4 fl (ref 78.0–100.0)
Platelets: 244 10*3/uL (ref 150.0–400.0)
RBC: 4.83 Mil/uL (ref 4.22–5.81)
RDW: 15.2 % (ref 11.5–15.5)
WBC: 12.1 10*3/uL — ABNORMAL HIGH (ref 4.0–10.5)

## 2018-11-11 LAB — MICROALBUMIN / CREATININE URINE RATIO
Creatinine,U: 165.4 mg/dL
Microalb Creat Ratio: 2.7 mg/g (ref 0.0–30.0)
Microalb, Ur: 4.5 mg/dL — ABNORMAL HIGH (ref 0.0–1.9)

## 2018-11-11 LAB — TSH: TSH: 1.27 u[IU]/mL (ref 0.35–4.50)

## 2018-11-11 LAB — VITAMIN D 25 HYDROXY (VIT D DEFICIENCY, FRACTURES): VITD: 29.23 ng/mL — ABNORMAL LOW (ref 30.00–100.00)

## 2018-11-11 MED ORDER — BUPROPION HCL ER (SR) 150 MG PO TB12: 150.0000 mg | ORAL_TABLET | Freq: Two times a day (BID) | ORAL | 2 refills | Status: DC

## 2018-11-11 MED ORDER — ALBUTEROL SULFATE HFA 108 (90 BASE) MCG/ACT IN AERS: 2.0000 | INHALATION_SPRAY | Freq: Four times a day (QID) | RESPIRATORY_TRACT | 0 refills | Status: DC | PRN

## 2018-11-11 MED ORDER — PREDNISONE 20 MG PO TABS: 20.0000 mg | ORAL_TABLET | Freq: Two times a day (BID) | ORAL | 0 refills | Status: AC

## 2018-11-11 NOTE — Progress Notes (Signed)
Established Patient Office Visit  Subjective:  Patient ID: Kevin English, male    DOB: July 16, 1955  Age: 63 y.o. MRN: [id number]  CC:  Chief Complaint  Patient presents with  . Follow-up    HPI [name] presents for follow-up and a general health check.  He tells me that he has been self-medicating and when I asked him to explain himself he said that he had been experimenting with not taking some of his medicines.  Says that he is back to taking all of his medicines as directed.  He has been taking his thyroid pills on a fasting stomach in the mornings.  But not necessarily an hour before eating.  Continues taking his Glucophage for his diabetes.  It is traditionally been well controlled.  He has hardly been active at all.  He did see neurology for his gait disorder and has been released.  Continues seeing cardiology for his hypertension, heart failure and atrial fibrillation.  Over the last week or so he has been experiencing sneezing postnasal drip and cough.  He has been wheezing the last few days.  There is been no fever or sputum production.  He has been taking Claritin.  Continues to take the Prozac and Wellbutrin 75 mg twice daily.  Continues to feel severely depressed.  Does not feel like doing much of anything.  Past Medical History:  Diagnosis Date  . Anxiety   . Borderline diabetes   . CAD (coronary artery disease)    a. By cath 08/2013: 70% distal cx-prox LPDA; tandem mod LAD lesions, o/w mild dz.  . CKD (chronic kidney disease), stage II    his creat. runs 2-2.5; hasnt seen neph yet - sees dr. Tresa Moore at [facility]  . Complete heart block (HCC)    a. s/p PPM   . Deafness in left ear   . Depression   . Diabetes mellitus without complication ([place])   . Diverticulitis of colon 15 years ago  . Hernia   . HTN (hypertension)   . Hyperlipidemia   . Hypogonadism male   . Hypothyroidism   . Lung nodule seen on imaging study 04/10/2014   5 mm nodule right lower lobe  . Mitral  stenosis    a. Dx 07/2013 - through workup of 2D echo, TEE, and cath, felt to be moderate.  . Obesity   . OSA on CPAP   . Persistent atrial fibrillation    a. sustained 160-180 on e-CARDIO monitor placed 07/29/2013. Placed on IV amiodarone at end of May 2015 due to continued paroxysms with RVR.  Marland Kitchen Renal cell carcinoma of right kidney ([place]) 09/05/2013   clear cell renal cell carcinoma of right kidney treated by radical nephrectomy, Fuhrman grade 2-3, with maximum tumor diameter 2.7 cm, all tumor to find confined to the kidney, and all surgical margins negative (T1aN0).   . Renal mass    a. Dx 08/2013: concerning for renal cancer.  . Rheumatic heart disease   . S/P Minimally invasive maze operation for atrial fibrillation 05/15/2014   Left side lesion set using cryothermy with clipping of LA appendage  . S/P minimally invasive mitral valve replacement with metallic valve and maze procedure 05/15/2014   43m Sorin Carbomedics Optiform mechanical valve placed via right mini thoracotomy approach  . Stroke ([facility] 1998   "MRI showed 3 mild strokes"    Past Surgical History:  Procedure Laterality Date  . EYE SURGERY Right 1990   "reconstructive, lens implant- from paintball  accident"  . INGUINAL HERNIA REPAIR Bilateral first one in 80's   "I had one side done twice"  . LEFT AND RIGHT HEART CATHETERIZATION WITH CORONARY ANGIOGRAM N/A 08/28/2013   Procedure: LEFT AND RIGHT HEART CATHETERIZATION WITH CORONARY ANGIOGRAM;  Surgeon: Leonie Man, MD;  Location: [facility] CATH LAB;  Service: Cardiovascular;  Laterality: N/A;  . MINIMALLY INVASIVE MAZE PROCEDURE N/A 05/15/2014   Procedure: MINIMALLY INVASIVE MAZE PROCEDURE;  Surgeon: Rexene Alberts, MD;  Location: [place];  Service: Open Heart Surgery;  Laterality: N/A;  . MITRAL VALVE REPLACEMENT Right 05/15/2014   Procedure: MINIMALLY INVASIVE MITRAL VALVE (MV) REPLACEMENT;  Surgeon: Rexene Alberts, MD;  Location: [place];  Service: Open Heart Surgery;  Laterality:  Right;  . PERMANENT PACEMAKER INSERTION Left 05/22/2014   a. MDT dual chamber PPM implanted by Dr Rayann Heman for CHB s/p MAZE/MVR  . ROBOT ASSISTED LAPAROSCOPIC NEPHRECTOMY Right 09/05/2013   Procedure: ROBOTIC ASSISTED LAPAROSCOPIC RIGHT RADICAL NEPHRECTOMY;  Surgeon: Sharyn Creamer, MD;  Location: [facility];  Service: Urology;  Laterality: Right;  . TEE WITHOUT CARDIOVERSION N/A 08/16/2013   Procedure: TRANSESOPHAGEAL ECHOCARDIOGRAM (TEE);  Surgeon: Dorothy Spark, MD;  Location: [place];  Service: Cardiovascular;  Laterality: N/A;  . TEE WITHOUT CARDIOVERSION N/A 05/15/2014   Procedure: TRANSESOPHAGEAL ECHOCARDIOGRAM (TEE);  Surgeon: Rexene Alberts, MD;  Location: [place];  Service: Open Heart Surgery;  Laterality: N/A;    Family History  Problem Relation Age of Onset  . Hypertension Mother   . Heart attack Mother   . Stroke Mother   . Heart disease Father   . Hypertension Other   . Heart disease Brother   . Diabetes Brother   . Healthy Son   . Healthy Daughter     Social History   Socioeconomic History  . Marital status: Married    Spouse name: Not on file  . Number of children: 2  . Years of education: 70  . Highest education level: Not on file  Occupational History  . Occupation: Retired     Comment: Careers information officer  . Financial resource strain: Not on file  . Food insecurity    Worry: Not on file    Inability: Not on file  . Transportation needs    Medical: Not on file    Non-medical: Not on file  Tobacco Use  . Smoking status: Current Every Day Smoker    Packs/day: 0.75    Years: 50.00    Pack years: 37.50    Types: Cigarettes, Cigars  . Smokeless tobacco: Never Used  . Tobacco comment: Cigar every now and then  Substance and Sexual Activity  . Alcohol use: No    Alcohol/week: 0.0 standard drinks  . Drug use: No  . Sexual activity: Not Currently  Lifestyle  . Physical activity    Days per week: Not on file    Minutes per session: Not on file  .  Stress: Not on file  Relationships  . Social Herbalist on phone: Not on file    Gets together: Not on file    Attends religious service: Not on file    Active member of club or organization: Not on file    Attends meetings of clubs or organizations: Not on file    Relationship status: Not on file  . Intimate partner violence    Fear of current or ex partner: Not on file    Emotionally abused: Not on file  Physically abused: Not on file    Forced sexual activity: Not on file  Other Topics Concern  . Not on file  Social History Narrative   Fun: Aggravate people.    Right hand    One story home    Outpatient Medications Prior to Visit  Medication Sig Dispense Refill  . acetaminophen (TYLENOL) 325 MG tablet Take 325 mg by mouth every 6 (six) hours as needed (pain).     Marland Kitchen amLODipine (NORVASC) 10 MG tablet TAKE 1 TABLET (10 MG TOTAL) BY MOUTH DAILY. 90 tablet 2  . aspirin EC 81 MG tablet Take 1 tablet (81 mg total) by mouth daily.    . Blood Glucose Monitoring Suppl (ONE TOUCH ULTRA MINI) w/Device KIT Use meter to check blood sugars 1-4 times daily as instructed. 1 each 0  . FLUoxetine (PROZAC) 20 MG tablet Take 1 tablet (20 mg total) by mouth daily. 90 tablet 1  . gabapentin (NEURONTIN) 400 MG capsule TAKE 1 CAPSULE (400 MG TOTAL) BY MOUTH AT BEDTIME. 90 capsule 1  . glucose blood (ONE TOUCH ULTRA TEST) test strip Use one strip per test. Test blood sugars 1-4 times daily as instructed. 100 each 12  . Lancets (ONETOUCH DELICA PLUS [id number]) MISC USE 1 LANCET PER TEST. TEST BLOOD SUGARS 1 4 TIMES PER DAY AS INSTRUCTED.    . Lancets Misc. (ONE TOUCH SURESOFT) MISC Use 1 lancet per test. Test blood sugars 1-4 times per day as instructed. 100 each 1  . levothyroxine (SYNTHROID, LEVOTHROID) 75 MCG tablet Take 1 tablet (75 mcg total) by mouth daily before breakfast. 90 tablet 2  . loratadine (CLARITIN) 10 MG tablet Take 1 tablet (10 mg total) by mouth daily.    Marland Kitchen MELATONIN  GUMMIES PO Take by mouth. Pt states taking 2 tablets 20 mg at night but making him more sleepy so taking 1 at night    . metFORMIN (GLUCOPHAGE) 500 MG tablet TAKE 1 TABLET (500 MG TOTAL) BY MOUTH 2 (TWO) TIMES DAILY WITH A MEAL. 60 tablet 5  . metoprolol tartrate (LOPRESSOR) 25 MG tablet Take 0.5 tablets (12.5 mg total) by mouth 2 (two) times daily. 180 tablet 3  . omega-3 acid ethyl esters (LOVAZA) 1 g capsule TAKE 2 CAPSULES (2 G TOTAL) BY MOUTH 2 (TWO) TIMES DAILY. 360 capsule 2  . warfarin (COUMADIN) 5 MG tablet TAKE AS DIRECTED BY ANTICOAGULATION CLINIC 120 tablet 1  . buPROPion (WELLBUTRIN) 75 MG tablet TAKE 1 TABLET BY MOUTH EACH MORNING AND A SECOND PILL MID AFTERNOON. 180 tablet 1   No facility-administered medications prior to visit.     Allergies  Allergen Reactions  . Contrast Media [Iodinated Diagnostic Agents] Hives and Other (See Comments)    Patient started sneezing and coughing, patient broke out in hives, patient needs 13 hour prep if having IV contrast  . Ioxaglate Hives and Rash    Patient started sneezing and coughing, patient broke out in hives, patient needs 13 hour prep if having IV contrast  . Atorvastatin Other (See Comments)    Myalgias  . Crestor [Rosuvastatin] Other (See Comments)    Myalgias  . Fenofibrate Other (See Comments)    MYALGIAS   . Pravastatin Other (See Comments)    Myalgias   . Pseudoephedrine Hcl Palpitations    ROS Review of Systems  Constitutional: Positive for fatigue. Negative for chills, diaphoresis, fever and unexpected weight change.  HENT: Positive for congestion and postnasal drip. Negative for rhinorrhea, sinus pressure and sinus  pain.   Eyes: Negative for photophobia and visual disturbance.  Respiratory: Positive for cough and wheezing. Negative for chest tightness.   Cardiovascular: Negative for chest pain, palpitations and leg swelling.  Gastrointestinal: Negative.   Endocrine: Negative for polyphagia and polyuria.   Genitourinary: Negative for decreased urine volume, frequency and urgency.  Musculoskeletal: Negative for joint swelling and myalgias.  Skin: Negative for pallor.  Allergic/Immunologic: Negative for immunocompromised state.  Neurological: Negative for seizures, speech difficulty, light-headedness and numbness.  Hematological: Does not bruise/bleed easily.  Psychiatric/Behavioral: Positive for dysphoric mood. The patient is nervous/anxious.      Depression screen [facility] 2/9 11/11/2018 07/06/2018 12/09/2017  Decreased Interest 1 1 0  Down, Depressed, Hopeless 3 1 0  PHQ - 2 Score 4 2 0  Altered sleeping 3 1 0  Tired, decreased energy 3 1 0  Change in appetite 3 1 0  Feeling bad or failure about yourself  3 1 0  Trouble concentrating 3 1 0  Moving slowly or fidgety/restless 0 1 0  Suicidal thoughts 0 0 0  PHQ-9 Score 19 8 0  Difficult doing work/chores - Not difficult at all -  Some recent data might be hidden       Objective:    Physical Exam  Constitutional: He is oriented to person, place, and time. He appears well-developed and well-nourished. No distress.  HENT:  Head: Normocephalic and atraumatic.  Right Ear: External ear normal.  Left Ear: External ear normal.  Mouth/Throat: Oropharynx is clear and moist. No oropharyngeal exudate.  Eyes: Pupils are equal, round, and reactive to light. Conjunctivae are normal. Right eye exhibits no discharge. Left eye exhibits no discharge. No scleral icterus.  Neck: Neck supple. No JVD present. No tracheal deviation present. No thyromegaly present.  Cardiovascular: Normal rate, regular rhythm and normal heart sounds.  Pulmonary/Chest: Effort normal. No stridor. He has wheezes. He has no rhonchi. He has no rales.  Abdominal: Bowel sounds are normal.  Musculoskeletal:        General: No edema.  Lymphadenopathy:    He has no cervical adenopathy.  Neurological: He is alert and oriented to person, place, and time.  Skin: Skin is warm and dry.  He is not diaphoretic.  Psychiatric: He has a normal mood and affect. His behavior is normal.    BP 128/80   Pulse 95   Ht _0  (1.727 m)   Wt 218 lb 6 oz (99.1 kg)   SpO2 98%   BMI 33.20 kg/m  Wt Readings from Last 3 Encounters:  11/11/18 218 lb 6 oz (99.1 kg)  10/31/18 217 lb (98.4 kg)  09/13/18 216 lb 3.2 oz (98.1 kg)   BP Readings from Last 3 Encounters:  11/11/18 128/80  10/31/18 (!) 144/85  09/13/18 122/70   Guideline developer:  UpToDate (see UpToDate for funding source) Date Released: June 2014  Health Maintenance Due  Topic Date Due  . COLONOSCOPY  05/18/2003  . TETANUS/TDAP  03/23/2014  . FOOT EXAM  10/22/2017  . URINE MICROALBUMIN  04/01/2018  . PNA vac Low Risk Adult (1 of 2 - PCV13) 05/17/2018  . HEMOGLOBIN A1C  07/12/2018  . INFLUENZA VACCINE  10/22/2018    There are no preventive care reminders to display for this patient.  Lab Results  Component Value Date   TSH 2.03 03/09/2018   Lab Results  Component Value Date   WBC 11.7 (H) 03/09/2018   HGB 16.1 03/09/2018   HCT 47.9 03/09/2018   MCV  95.8 03/09/2018   PLT 248.0 03/09/2018   Lab Results  Component Value Date   NA 139 03/09/2018   K 4.1 03/09/2018   CHLORIDE 109 01/02/2016   CO2 24 03/09/2018   GLUCOSE 95 03/09/2018   BUN 18 03/09/2018   CREATININE 0.81 03/09/2018   BILITOT 0.5 03/09/2018   ALKPHOS 46 03/09/2018   AST 23 03/09/2018   ALT 25 03/09/2018   PROT 7.3 03/09/2018   ALBUMIN 4.5 03/09/2018   CALCIUM 10.1 09/13/2018   ANIONGAP 8 03/03/2016   EGFR 49 (L) 01/02/2016   GFR 101.71 03/09/2018   Lab Results  Component Value Date   CHOL 160 04/22/2017   Lab Results  Component Value Date   HDL 22 (L) 04/22/2017   Lab Results  Component Value Date   LDLCALC 77 04/22/2017   Lab Results  Component Value Date   TRIG 307 (H) 04/22/2017   Lab Results  Component Value Date   CHOLHDL 7.3 (H) 04/22/2017   Lab Results  Component Value Date   HGBA1C 6.0 01/10/2018       Assessment & Plan:   Problem List Items Addressed This Visit      Endocrine   Hypothyroidism (acquired) (Chronic)   Relevant Orders   TSH   Controlled type 2 diabetes mellitus with stage 3 chronic kidney disease, without long-term current use of insulin (HCC)   Relevant Orders   CBC   Comprehensive metabolic panel   Hemoglobin A1c   Urinalysis, Routine w reflex microscopic   Microalbumin / creatinine urine ratio     Genitourinary   CKD (chronic kidney disease), stage II (Chronic)   Relevant Orders   Comprehensive metabolic panel   Microalbumin / creatinine urine ratio     Other   Vitamin D deficiency - Primary   Relevant Orders   VITAMIN D 25 Hydroxy (Vit-D Deficiency, Fractures)   Depression, major, single episode, severe (HCC)   Relevant Medications   buPROPion (WELLBUTRIN SR) 150 MG 12 hr tablet   Other Relevant Orders   Ambulatory referral to Psychiatry    Other Visit Diagnoses    Seasonal allergic rhinitis due to pollen       Relevant Medications   predniSONE (DELTASONE) 20 MG tablet   Moderate persistent reactive airway disease with acute exacerbation       Relevant Medications   predniSONE (DELTASONE) 20 MG tablet   albuterol (VENTOLIN HFA) 108 (90 Base) MCG/ACT inhaler   Health care maintenance       Relevant Orders   PSA      Meds ordered this encounter  Medications  . predniSONE (DELTASONE) 20 MG tablet    Sig: Take 1 tablet (20 mg total) by mouth 2 (two) times daily with a meal for 7 days.    Dispense:  14 tablet    Refill:  0  . albuterol (VENTOLIN HFA) 108 (90 Base) MCG/ACT inhaler    Sig: Inhale 2 puffs into the lungs every 6 (six) hours as needed for wheezing or shortness of breath.    Dispense:  18 g    Refill:  0  . buPROPion (WELLBUTRIN SR) 150 MG 12 hr tablet    Sig: Take 1 tablet (150 mg total) by mouth 2 (two) times daily.    Dispense:  60 tablet    Refill:  2    Follow-up: Return in about 1 month (around 12/12/2018).    Have  increased Wellbutrin to 150 mg twice daily.  Referred to psychiatry  again for consultation.  Patient's wife tells me that the has not been contacted with prior referrals.  Advised to stop smoking.  Will place patient on a short course of prednisone and using albuterol inhaler as needed.  Continue Claritin.  I spent over 45 minutes with this patient with greater than 50% of the time spent counseling.

## 2018-11-12 ENCOUNTER — Other Ambulatory Visit: Payer: Self-pay | Admitting: Cardiology

## 2018-11-12 DIAGNOSIS — Z954 Presence of other heart-valve replacement: Secondary | ICD-10-CM

## 2018-11-12 DIAGNOSIS — Z5181 Encounter for therapeutic drug level monitoring: Secondary | ICD-10-CM

## 2018-11-12 DIAGNOSIS — Z952 Presence of prosthetic heart valve: Secondary | ICD-10-CM

## 2018-11-13 ENCOUNTER — Other Ambulatory Visit: Payer: Self-pay | Admitting: Family Medicine

## 2018-11-13 DIAGNOSIS — F322 Major depressive disorder, single episode, severe without psychotic features: Secondary | ICD-10-CM

## 2018-11-15 MED ORDER — VITAMIN D (ERGOCALCIFEROL) 1.25 MG (50000 UNIT) PO CAPS: 50000.0000 [IU] | ORAL_CAPSULE | ORAL | 7 refills | Status: DC

## 2018-11-15 NOTE — Addendum Note (Signed)
Addended by: Jon Billings on: 11/15/2018 08:26 AM   Modules accepted: Orders

## 2018-11-20 ENCOUNTER — Other Ambulatory Visit: Payer: Self-pay | Admitting: Family Medicine

## 2018-11-22 ENCOUNTER — Ambulatory Visit (INDEPENDENT_AMBULATORY_CARE_PROVIDER_SITE_OTHER): Payer: PPO | Admitting: *Deleted

## 2018-11-22 ENCOUNTER — Other Ambulatory Visit: Payer: Self-pay

## 2018-11-22 DIAGNOSIS — I4891 Unspecified atrial fibrillation: Secondary | ICD-10-CM | POA: Diagnosis not present

## 2018-11-22 DIAGNOSIS — Z954 Presence of other heart-valve replacement: Secondary | ICD-10-CM

## 2018-11-22 DIAGNOSIS — I5033 Acute on chronic diastolic (congestive) heart failure: Secondary | ICD-10-CM

## 2018-11-22 DIAGNOSIS — Z5181 Encounter for therapeutic drug level monitoring: Secondary | ICD-10-CM | POA: Diagnosis not present

## 2018-11-22 DIAGNOSIS — Z0000 Encounter for general adult medical examination without abnormal findings: Secondary | ICD-10-CM | POA: Diagnosis not present

## 2018-11-22 DIAGNOSIS — I050 Rheumatic mitral stenosis: Secondary | ICD-10-CM | POA: Diagnosis not present

## 2018-11-22 LAB — POCT INR: INR: 3.6 — AB (ref 2.0–3.0)

## 2018-11-22 NOTE — Patient Instructions (Signed)
Description   Today take 1/2 tablet, then continue taking 1.5 tablets daily except 1 tablet on Mondays, Wednesdays and Fridays.  Recheck in 3 weeks in the [place] office.  Call [phone] with any questions.

## 2018-12-04 ENCOUNTER — Other Ambulatory Visit: Payer: Self-pay | Admitting: Family Medicine

## 2018-12-04 DIAGNOSIS — F322 Major depressive disorder, single episode, severe without psychotic features: Secondary | ICD-10-CM

## 2018-12-15 ENCOUNTER — Telehealth: Payer: Self-pay

## 2018-12-15 NOTE — Telephone Encounter (Signed)

## 2018-12-16 ENCOUNTER — Ambulatory Visit (INDEPENDENT_AMBULATORY_CARE_PROVIDER_SITE_OTHER): Payer: PPO | Admitting: Family Medicine

## 2018-12-16 ENCOUNTER — Ambulatory Visit (INDEPENDENT_AMBULATORY_CARE_PROVIDER_SITE_OTHER): Payer: PPO | Admitting: Pharmacist

## 2018-12-16 ENCOUNTER — Other Ambulatory Visit: Payer: Self-pay

## 2018-12-16 ENCOUNTER — Encounter: Payer: Self-pay | Admitting: Family Medicine

## 2018-12-16 VITALS — BP 128/80 | HR 91 | Ht 68.0 in | Wt 216.0 lb

## 2018-12-16 DIAGNOSIS — I050 Rheumatic mitral stenosis: Secondary | ICD-10-CM | POA: Diagnosis not present

## 2018-12-16 DIAGNOSIS — F322 Major depressive disorder, single episode, severe without psychotic features: Secondary | ICD-10-CM

## 2018-12-16 DIAGNOSIS — I5033 Acute on chronic diastolic (congestive) heart failure: Secondary | ICD-10-CM | POA: Diagnosis not present

## 2018-12-16 DIAGNOSIS — E559 Vitamin D deficiency, unspecified: Secondary | ICD-10-CM | POA: Diagnosis not present

## 2018-12-16 DIAGNOSIS — Z954 Presence of other heart-valve replacement: Secondary | ICD-10-CM

## 2018-12-16 DIAGNOSIS — Z5181 Encounter for therapeutic drug level monitoring: Secondary | ICD-10-CM

## 2018-12-16 DIAGNOSIS — Z0000 Encounter for general adult medical examination without abnormal findings: Secondary | ICD-10-CM

## 2018-12-16 DIAGNOSIS — E1122 Type 2 diabetes mellitus with diabetic chronic kidney disease: Secondary | ICD-10-CM

## 2018-12-16 DIAGNOSIS — R319 Hematuria, unspecified: Secondary | ICD-10-CM | POA: Diagnosis not present

## 2018-12-16 DIAGNOSIS — R269 Unspecified abnormalities of gait and mobility: Secondary | ICD-10-CM

## 2018-12-16 DIAGNOSIS — N183 Chronic kidney disease, stage 3 (moderate): Secondary | ICD-10-CM

## 2018-12-16 DIAGNOSIS — I4891 Unspecified atrial fibrillation: Secondary | ICD-10-CM | POA: Diagnosis not present

## 2018-12-16 LAB — POCT INR: INR: 4.6 — AB (ref 2.0–3.0)

## 2018-12-16 MED ORDER — VENLAFAXINE HCL ER 37.5 MG PO CP24: ORAL_CAPSULE | ORAL | 1 refills | Status: DC

## 2018-12-16 NOTE — Addendum Note (Signed)
Addended by: Lynnea Ferrier on: 12/16/2018 12:13 PM   Modules accepted: Orders

## 2018-12-16 NOTE — Patient Instructions (Signed)
HOLD warfarin dose today 9/25 ONLY,take 1/2 table tomorrow , then resume 1.5 tablets daily except 1 tablet on Mondays, Wednesdays and Fridays.  Recheck in 2 weeks in the [place] office.  Call [phone] with any questions

## 2018-12-16 NOTE — Progress Notes (Signed)
Established Patient Office Visit  Subjective:  Patient ID: Kevin English, male    DOB: 03/07/1956  Age: 63 y.o. MRN: [id number]  CC:  Chief Complaint  Patient presents with  . Follow-up    HPI Kevin English presents for follow-up of his depression, diabetes hematuria and vitamin D deficiency.  Patient has not responded at all to the higher dose of Wellbutrin.  Wife tells me that they have not heard from psychiatry.  Patient has tried talking therapy in the past without much of a result.  Patient is taking diabetes medicines as directed but remains quite sedentary despite therapy with too activating antidepressants.  Follow-up of hematuria.  There is not been gross hematuria.  Wife reminds me that 1 of his kidneys has been removed secondary to renal cancer.  Nephrology has also been following his vitamin D levels.  They discontinued his vitamin D in the past secondary to high calcium levels.  Patient continues to have intermittent gait issues.  Neurology has cleared him for this issue from a neurological standpoint.  Past Medical History:  Diagnosis Date  . Anxiety   . Borderline diabetes   . CAD (coronary artery disease)    a. By cath 08/2013: 70% distal cx-prox LPDA; tandem mod LAD lesions, o/w mild dz.  . CKD (chronic kidney disease), stage II    his creat. runs 2-2.5; hasnt seen neph yet - sees dr. Tresa Moore at [facility]  . Complete heart block (HCC)    a. s/p PPM   . Deafness in left ear   . Depression   . Diabetes mellitus without complication ([place])   . Diverticulitis of colon 15 years ago  . Hernia   . HTN (hypertension)   . Hyperlipidemia   . Hypogonadism male   . Hypothyroidism   . Lung nodule seen on imaging study 04/10/2014   5 mm nodule right lower lobe  . Mitral stenosis    a. Dx 07/2013 - through workup of 2D echo, TEE, and cath, felt to be moderate.  . Obesity   . OSA on CPAP   . Persistent atrial fibrillation    a. sustained 160-180 on e-CARDIO monitor placed  07/29/2013. Placed on IV amiodarone at end of May 2015 due to continued paroxysms with RVR.  Marland Kitchen Renal cell carcinoma of right kidney ([place]) 09/05/2013   clear cell renal cell carcinoma of right kidney treated by radical nephrectomy, Fuhrman grade 2-3, with maximum tumor diameter 2.7 cm, all tumor to find confined to the kidney, and all surgical margins negative (T1aN0).   . Renal mass    a. Dx 08/2013: concerning for renal cancer.  . Rheumatic heart disease   . S/P Minimally invasive maze operation for atrial fibrillation 05/15/2014   Left side lesion set using cryothermy with clipping of LA appendage  . S/P minimally invasive mitral valve replacement with metallic valve and maze procedure 05/15/2014   51m Sorin Carbomedics Optiform mechanical valve placed via right mini thoracotomy approach  . Stroke ([facility] 1998   "MRI showed 3 mild strokes"    Past Surgical History:  Procedure Laterality Date  . EYE SURGERY Right 1990   "reconstructive, lens implant- from paintball accident"  . INGUINAL HERNIA REPAIR Bilateral first one in 80's   "I had one side done twice"  . LEFT AND RIGHT HEART CATHETERIZATION WITH CORONARY ANGIOGRAM N/A 08/28/2013   Procedure: LEFT AND RIGHT HEART CATHETERIZATION WITH CORONARY ANGIOGRAM;  Surgeon: DLeonie Man MD;  [facility];  Service: Cardiovascular;  Laterality: N/A;  . MINIMALLY INVASIVE MAZE PROCEDURE N/A 05/15/2014   Procedure: MINIMALLY INVASIVE MAZE PROCEDURE;  Surgeon: Rexene Alberts, MD;  Location: [place];  Service: Open Heart Surgery;  Laterality: N/A;  . MITRAL VALVE REPLACEMENT Right 05/15/2014   Procedure: MINIMALLY INVASIVE MITRAL VALVE (MV) REPLACEMENT;  Surgeon: Rexene Alberts, MD;  Location: [place];  Service: Open Heart Surgery;  Laterality: Right;  . PERMANENT PACEMAKER INSERTION Left 05/22/2014   a. MDT dual chamber PPM implanted by Dr Rayann Heman for CHB s/p MAZE/MVR  . ROBOT ASSISTED LAPAROSCOPIC NEPHRECTOMY Right 09/05/2013   Procedure: ROBOTIC  ASSISTED LAPAROSCOPIC RIGHT RADICAL NEPHRECTOMY;  Surgeon: Sharyn Creamer, MD;  Location: [facility];  Service: Urology;  Laterality: Right;  . TEE WITHOUT CARDIOVERSION N/A 08/16/2013   Procedure: TRANSESOPHAGEAL ECHOCARDIOGRAM (TEE);  Surgeon: Dorothy Spark, MD;  Location: [place];  Service: Cardiovascular;  Laterality: N/A;  . TEE WITHOUT CARDIOVERSION N/A 05/15/2014   Procedure: TRANSESOPHAGEAL ECHOCARDIOGRAM (TEE);  Surgeon: Rexene Alberts, MD;  Location: [place];  Service: Open Heart Surgery;  Laterality: N/A;    Family History  Problem Relation Age of Onset  . Hypertension Mother   . Heart attack Mother   . Stroke Mother   . Heart disease Father   . Hypertension Other   . Heart disease Brother   . Diabetes Brother   . Healthy Son   . Healthy Daughter     Social History   Socioeconomic History  . Marital status: Married    Spouse name: Not on file  . Number of children: 2  . Years of education: 46  . Highest education level: Not on file  Occupational History  . Occupation: Retired     Comment: Careers information officer  . Financial resource strain: Not on file  . Food insecurity    Worry: Not on file    Inability: Not on file  . Transportation needs    Medical: Not on file    Non-medical: Not on file  Tobacco Use  . Smoking status: Current Every Day Smoker    Packs/day: 0.75    Years: 50.00    Pack years: 37.50    Types: Cigarettes, Cigars  . Smokeless tobacco: Never Used  . Tobacco comment: Cigar every now and then  Substance and Sexual Activity  . Alcohol use: No    Alcohol/week: 0.0 standard drinks  . Drug use: No  . Sexual activity: Not Currently  Lifestyle  . Physical activity    Days per week: Not on file    Minutes per session: Not on file  . Stress: Not on file  Relationships  . Social Herbalist on phone: Not on file    Gets together: Not on file    Attends religious service: Not on file    Active member of club or organization:  Not on file    Attends meetings of clubs or organizations: Not on file    Relationship status: Not on file  . Intimate partner violence    Fear of current or ex partner: Not on file    Emotionally abused: Not on file    Physically abused: Not on file    Forced sexual activity: Not on file  Other Topics Concern  . Not on file  Social History Narrative   Fun: Aggravate people.    Right hand    One story home    Outpatient Medications Prior to  Visit  Medication Sig Dispense Refill  . acetaminophen (TYLENOL) 325 MG tablet Take 325 mg by mouth every 6 (six) hours as needed (pain).     Marland Kitchen albuterol (VENTOLIN HFA) 108 (90 Base) MCG/ACT inhaler Inhale 2 puffs into the lungs every 6 (six) hours as needed for wheezing or shortness of breath. 18 g 0  . amLODipine (NORVASC) 10 MG tablet TAKE 1 TABLET (10 MG TOTAL) BY MOUTH DAILY. 90 tablet 2  . aspirin EC 81 MG tablet Take 1 tablet (81 mg total) by mouth daily.    . Blood Glucose Monitoring Suppl (ONE TOUCH ULTRA MINI) w/Device KIT Use meter to check blood sugars 1-4 times daily as instructed. 1 each 0  . buPROPion (WELLBUTRIN SR) 150 MG 12 hr tablet TAKE 1 TABLET BY MOUTH TWICE A DAY 180 tablet 1  . gabapentin (NEURONTIN) 400 MG capsule TAKE 1 CAPSULE (400 MG TOTAL) BY MOUTH AT BEDTIME. 90 capsule 1  . glucose blood (ONE TOUCH ULTRA TEST) test strip Use one strip per test. Test blood sugars 1-4 times daily as instructed. 100 each 12  . Lancets (ONETOUCH DELICA PLUS [id number]) MISC USE 1 LANCET PER TEST. TEST BLOOD SUGARS 1 4 TIMES PER DAY AS INSTRUCTED.    . Lancets (ONETOUCH DELICA PLUS [id number]) MISC USE 1 LANCET PER TEST. TEST BLOOD SUGARS 1-4 TIMES PER DAY AS INSTRUCTED. 100 each 1  . levothyroxine (SYNTHROID, LEVOTHROID) 75 MCG tablet Take 1 tablet (75 mcg total) by mouth daily before breakfast. 90 tablet 2  . loratadine (CLARITIN) 10 MG tablet Take 1 tablet (10 mg total) by mouth daily.    Marland Kitchen MELATONIN GUMMIES PO Take by mouth. Pt states  taking 2 tablets 20 mg at night but making him more sleepy so taking 1 at night    . metFORMIN (GLUCOPHAGE) 500 MG tablet TAKE 1 TABLET (500 MG TOTAL) BY MOUTH 2 (TWO) TIMES DAILY WITH A MEAL. 60 tablet 5  . metoprolol tartrate (LOPRESSOR) 25 MG tablet TAKE 0.5 TABLETS (12.5 MG TOTAL) BY MOUTH 2 (TWO) TIMES DAILY. 90 tablet 1  . omega-3 acid ethyl esters (LOVAZA) 1 g capsule TAKE 2 CAPSULES (2 G TOTAL) BY MOUTH 2 (TWO) TIMES DAILY. 360 capsule 2  . Vitamin D, Ergocalciferol, (DRISDOL) 1.25 MG (50000 UT) CAPS capsule Take 1 capsule (50,000 Units total) by mouth every 7 (seven) days. 5 capsule 7  . warfarin (COUMADIN) 5 MG tablet TAKE AS DIRECTED BY ANTICOAGULATION CLINIC 120 tablet 1  . FLUoxetine (PROZAC) 20 MG tablet TAKE 1 TABLET BY MOUTH EVERY DAY 90 tablet 1   No facility-administered medications prior to visit.     Allergies  Allergen Reactions  . Contrast Media [Iodinated Diagnostic Agents] Hives and Other (See Comments)    Patient started sneezing and coughing, patient broke out in hives, patient needs 13 hour prep if having IV contrast  . Ioxaglate Hives and Rash    Patient started sneezing and coughing, patient broke out in hives, patient needs 13 hour prep if having IV contrast  . Atorvastatin Other (See Comments)    Myalgias  . Crestor [Rosuvastatin] Other (See Comments)    Myalgias  . Fenofibrate Other (See Comments)    MYALGIAS   . Pravastatin Other (See Comments)    Myalgias   . Pseudoephedrine Hcl Palpitations    ROS Review of Systems  Constitutional: Negative for fatigue, fever and unexpected weight change.  Respiratory: Negative.   Cardiovascular: Negative.   Gastrointestinal: Negative.   Musculoskeletal: Positive  for gait problem.  Psychiatric/Behavioral: Positive for dysphoric mood.      Objective:    Physical Exam  BP 128/80   Pulse 91   Ht _0  (1.727 m)   Wt 216 lb (98 kg)   SpO2 97%   BMI 32.84 kg/m  Wt Readings from Last 3 Encounters:   12/16/18 216 lb (98 kg)  11/11/18 218 lb 6 oz (99.1 kg)  10/31/18 217 lb (98.4 kg)   BP Readings from Last 3 Encounters:  12/16/18 128/80  11/11/18 128/80  10/31/18 (!) 144/85   Guideline developer:  UpToDate (see UpToDate for funding source) Date Released: June 2014  Health Maintenance Due  Topic Date Due  . COLONOSCOPY  05/18/2003  . TETANUS/TDAP  03/23/2014  . FOOT EXAM  10/22/2017  . PNA vac Low Risk Adult (1 of 2 - PCV13) 05/17/2018  . INFLUENZA VACCINE  10/22/2018    There are no preventive care reminders to display for this patient.  Lab Results  Component Value Date   TSH 1.27 11/11/2018   Lab Results  Component Value Date   WBC 12.1 (H) 11/11/2018   HGB 15.5 11/11/2018   HCT 46.6 11/11/2018   MCV 96.4 11/11/2018   PLT 244.0 11/11/2018   Lab Results  Component Value Date   NA 136 11/11/2018   K 4.4 11/11/2018   CHLORIDE 109 01/02/2016   CO2 22 11/11/2018   GLUCOSE 215 (H) 11/11/2018   BUN 22 11/11/2018   CREATININE 1.58 (H) 11/11/2018   BILITOT 0.4 11/11/2018   ALKPHOS 79 11/11/2018   AST 26 11/11/2018   ALT 53 11/11/2018   PROT 6.7 11/11/2018   ALBUMIN 4.3 11/11/2018   CALCIUM 9.7 11/11/2018   ANIONGAP 8 03/03/2016   EGFR 49 (L) 01/02/2016   GFR 44.17 (L) 11/11/2018   Lab Results  Component Value Date   CHOL 160 04/22/2017   Lab Results  Component Value Date   HDL 22 (L) 04/22/2017   Lab Results  Component Value Date   LDLCALC 77 04/22/2017   Lab Results  Component Value Date   TRIG 307 (H) 04/22/2017   Lab Results  Component Value Date   CHOLHDL 7.3 (H) 04/22/2017   Lab Results  Component Value Date   HGBA1C 8.1 (H) 11/11/2018      Assessment & Plan:   Problem List Items Addressed This Visit      Endocrine   Controlled type 2 diabetes mellitus with stage 3 chronic kidney disease, without long-term current use of insulin (HCC)     Other   Vitamin D deficiency   Depression, major, single episode, severe (HCC) - Primary    Relevant Medications   venlafaxine XR (EFFEXOR XR) 37.5 MG 24 hr capsule   Other Relevant Orders   Ambulatory referral to Psychiatry   Gait disturbance   Relevant Orders   For home use only DME 4 wheeled rolling walker with seat ([id number])   Hematuria   Relevant Orders   Urinalysis, Routine w reflex microscopic      Meds ordered this encounter  Medications  . venlafaxine XR (EFFEXOR XR) 37.5 MG 24 hr capsule    Sig: Take one daily for one week and then increase to 2 daily.    Dispense:  60 capsule    Refill:  1    Follow-up: Return in about 1 month (around 01/15/2019), or discontinue prozac.   With its long half-life we will go ahead and discontinue Prozac today.  We will  start Effexor 37.5 mg daily for a week and then increase to 2 daily.  Continue Wellbutrin 150 mg SR twice daily.  Follow-up in 1 month or sooner as needed.  We will go ahead and let nephrology take over vitamin D management.

## 2018-12-18 ENCOUNTER — Other Ambulatory Visit: Payer: Self-pay | Admitting: Family Medicine

## 2018-12-18 DIAGNOSIS — E1122 Type 2 diabetes mellitus with diabetic chronic kidney disease: Secondary | ICD-10-CM

## 2019-01-02 ENCOUNTER — Telehealth: Payer: Self-pay

## 2019-01-02 NOTE — Telephone Encounter (Signed)
-----   Message from Frederic Jericho sent at 12/30/2018  8:30 AM EDT ----- Regarding: Need PT in [place] Patient wife called and wants to have PT in [place], I saw 10/20 at 3pm but thedate would not let me schedule, Please call patient.

## 2019-01-02 NOTE — Telephone Encounter (Signed)
Called and rescheduled a pt in [place]

## 2019-01-03 ENCOUNTER — Ambulatory Visit (INDEPENDENT_AMBULATORY_CARE_PROVIDER_SITE_OTHER): Payer: PPO | Admitting: Cardiology

## 2019-01-03 ENCOUNTER — Other Ambulatory Visit: Payer: Self-pay

## 2019-01-03 DIAGNOSIS — Z954 Presence of other heart-valve replacement: Secondary | ICD-10-CM | POA: Diagnosis not present

## 2019-01-03 DIAGNOSIS — I4891 Unspecified atrial fibrillation: Secondary | ICD-10-CM | POA: Diagnosis not present

## 2019-01-03 DIAGNOSIS — Z5181 Encounter for therapeutic drug level monitoring: Secondary | ICD-10-CM

## 2019-01-03 DIAGNOSIS — I5033 Acute on chronic diastolic (congestive) heart failure: Secondary | ICD-10-CM | POA: Diagnosis not present

## 2019-01-03 DIAGNOSIS — I050 Rheumatic mitral stenosis: Secondary | ICD-10-CM | POA: Diagnosis not present

## 2019-01-03 DIAGNOSIS — Z0000 Encounter for general adult medical examination without abnormal findings: Secondary | ICD-10-CM

## 2019-01-03 LAB — POCT INR: INR: 3.5 — AB (ref 2.0–3.0)

## 2019-01-04 ENCOUNTER — Other Ambulatory Visit (HOSPITAL_COMMUNITY): Payer: Self-pay | Admitting: Cardiology

## 2019-01-04 DIAGNOSIS — I4891 Unspecified atrial fibrillation: Secondary | ICD-10-CM

## 2019-01-04 DIAGNOSIS — Z5181 Encounter for therapeutic drug level monitoring: Secondary | ICD-10-CM

## 2019-01-04 DIAGNOSIS — Z954 Presence of other heart-valve replacement: Secondary | ICD-10-CM

## 2019-01-04 DIAGNOSIS — Z0000 Encounter for general adult medical examination without abnormal findings: Secondary | ICD-10-CM

## 2019-01-04 DIAGNOSIS — I050 Rheumatic mitral stenosis: Secondary | ICD-10-CM

## 2019-01-04 DIAGNOSIS — I5033 Acute on chronic diastolic (congestive) heart failure: Secondary | ICD-10-CM

## 2019-01-06 ENCOUNTER — Other Ambulatory Visit: Payer: Self-pay | Admitting: Cardiology

## 2019-01-10 ENCOUNTER — Other Ambulatory Visit: Payer: Self-pay | Admitting: Family Medicine

## 2019-01-10 DIAGNOSIS — F322 Major depressive disorder, single episode, severe without psychotic features: Secondary | ICD-10-CM

## 2019-01-12 ENCOUNTER — Ambulatory Visit (INDEPENDENT_AMBULATORY_CARE_PROVIDER_SITE_OTHER): Payer: PPO | Admitting: *Deleted

## 2019-01-12 DIAGNOSIS — I4891 Unspecified atrial fibrillation: Secondary | ICD-10-CM

## 2019-01-12 DIAGNOSIS — I442 Atrioventricular block, complete: Secondary | ICD-10-CM

## 2019-01-12 LAB — CUP PACEART REMOTE DEVICE CHECK
Battery Impedance: 231 Ohm
Battery Remaining Longevity: 121 mo
Battery Voltage: 2.78 V
Brady Statistic AP VP Percent: 0 %
Brady Statistic AP VS Percent: 55 %
Brady Statistic AS VP Percent: 0 %
Brady Statistic AS VS Percent: 45 %
Date Time Interrogation Session: 20201022153839
Implantable Lead Implant Date: 20160301
Implantable Lead Implant Date: 20160301
Implantable Lead Location: 753859
Implantable Lead Location: 753860
Implantable Lead Model: 5076
Implantable Lead Model: 5076
Implantable Pulse Generator Implant Date: 20160301
Lead Channel Impedance Value: 431 Ohm
Lead Channel Impedance Value: 653 Ohm
Lead Channel Pacing Threshold Amplitude: 0.5 V
Lead Channel Pacing Threshold Amplitude: 0.625 V
Lead Channel Pacing Threshold Pulse Width: 0.4 ms
Lead Channel Pacing Threshold Pulse Width: 0.4 ms
Lead Channel Setting Pacing Amplitude: 2 V
Lead Channel Setting Pacing Amplitude: 2.5 V
Lead Channel Setting Pacing Pulse Width: 0.4 ms
Lead Channel Setting Sensing Sensitivity: 4 mV

## 2019-01-17 ENCOUNTER — Ambulatory Visit: Payer: PPO | Admitting: Cardiology

## 2019-01-17 ENCOUNTER — Other Ambulatory Visit: Payer: Self-pay

## 2019-01-17 ENCOUNTER — Encounter: Payer: Self-pay | Admitting: Cardiology

## 2019-01-17 ENCOUNTER — Ambulatory Visit (INDEPENDENT_AMBULATORY_CARE_PROVIDER_SITE_OTHER): Payer: PPO

## 2019-01-17 VITALS — BP 138/86 | HR 79 | Ht 68.0 in | Wt 212.0 lb

## 2019-01-17 DIAGNOSIS — I442 Atrioventricular block, complete: Secondary | ICD-10-CM | POA: Diagnosis not present

## 2019-01-17 DIAGNOSIS — Z954 Presence of other heart-valve replacement: Secondary | ICD-10-CM | POA: Diagnosis not present

## 2019-01-17 DIAGNOSIS — I4891 Unspecified atrial fibrillation: Secondary | ICD-10-CM

## 2019-01-17 DIAGNOSIS — I480 Paroxysmal atrial fibrillation: Secondary | ICD-10-CM | POA: Diagnosis not present

## 2019-01-17 DIAGNOSIS — I2510 Atherosclerotic heart disease of native coronary artery without angina pectoris: Secondary | ICD-10-CM | POA: Diagnosis not present

## 2019-01-17 DIAGNOSIS — W19XXXA Unspecified fall, initial encounter: Secondary | ICD-10-CM

## 2019-01-17 DIAGNOSIS — I2583 Coronary atherosclerosis due to lipid rich plaque: Secondary | ICD-10-CM | POA: Diagnosis not present

## 2019-01-17 DIAGNOSIS — I5033 Acute on chronic diastolic (congestive) heart failure: Secondary | ICD-10-CM

## 2019-01-17 DIAGNOSIS — Z0000 Encounter for general adult medical examination without abnormal findings: Secondary | ICD-10-CM

## 2019-01-17 DIAGNOSIS — I050 Rheumatic mitral stenosis: Secondary | ICD-10-CM

## 2019-01-17 DIAGNOSIS — R42 Dizziness and giddiness: Secondary | ICD-10-CM

## 2019-01-17 DIAGNOSIS — G720 Drug-induced myopathy: Secondary | ICD-10-CM | POA: Diagnosis not present

## 2019-01-17 DIAGNOSIS — T466X5A Adverse effect of antihyperlipidemic and antiarteriosclerotic drugs, initial encounter: Secondary | ICD-10-CM

## 2019-01-17 DIAGNOSIS — N1831 Chronic kidney disease, stage 3a: Secondary | ICD-10-CM | POA: Diagnosis not present

## 2019-01-17 LAB — POCT INR: INR: 3 (ref 2.0–3.0)

## 2019-01-17 MED ORDER — METOPROLOL TARTRATE 25 MG PO TABS: 25.0000 mg | ORAL_TABLET | Freq: Two times a day (BID) | ORAL | 3 refills | Status: DC

## 2019-01-17 NOTE — Patient Instructions (Signed)
Medication Instructions:   INCREASE YOUR METOPROLOL TARTRATE TO 25 MG BY MOUTH TWICE DAILY  *If you need a refill on your cardiac medications before your next appointment, please call your pharmacy*    Testing/Procedures:  Your physician has requested that you have a carotid duplex. This test is an ultrasound of the carotid arteries in your neck. It looks at blood flow through these arteries that supply the brain with blood. Allow one hour for this exam. There are no restrictions or special instructions.    Follow-Up: At [facility], you and your health needs are our priority.  As part of our continuing mission to provide you with exceptional heart care, we have created designated Provider Care Teams.  These Care Teams include your primary Cardiologist (physician) and Advanced Practice Providers (APPs -  Physician Assistants and Nurse Practitioners) who all work together to provide you with the care you need, when you need it.  Your next appointment:   6 months  The format for your next appointment:   Either In Person or Virtual  Provider:   Ena Dawley, MD

## 2019-01-17 NOTE — Progress Notes (Addendum)
Cardiology Office Note:    Date:  01/17/2019   ID:  Kevin English, DOB October 30, 1955, MRN [id number]  PCP:  Libby Maw, MD  Cardiologist:  Dr. Ena Dawley   Electrophysiologist:  Dr. Thompson Grayer  Nephrologist: Dr. Marval Regal  Chief complain: 6 months follow-up  History of Present Illness:     Kevin English is a 63 y.o. male with a hx of HTN, HL (intol to statins), former smoker, PAFib, Rheumatic heart disease with severe mitral stenosis, diastolic HF, depression, CKD, renal cell CA, OSA. He has been on Coumadin. He had been prepared for minimally invasive MVR and placed on Amiodarone. However, he developed clear cell renal cell CA and underwent R nephrectomy. He developed fatigue with beta-blocker and Bystolic was DC'd. He was ultimately stabilized and underwent Minimally-Invasive Mitral Valve Replacement (Sorin Carbomedics Optiform bileaflet mechanical valve) and Maze Procedure with Left atrial lesion set using cryothermy and Clipping of Left Atrial Appendage with Dr. Roxy Manns in 2/16. Post op course was complicated by complete heart block. He ultimately required implantation of a Medtronic Adapta L dual-chamber pacemaker.   03/31/2018 -this is 6 months follow-up, the patient denies any chest pain but is feeling more and more weak now having difficulties walking stairs.  He stays inside of the house most of days does not exercise walk, his wife states that he has been excessively eating unhealthy food.  He takes his medications and has no side effects.  No bleeding.  He denies any orthopnea proximal nocturnal dyspnea no lower extremity edema.  01/16/2019 -this is 9 months follow-up, the patient has been experiencing falls, he feels like his legs give out on him when he gets up.  He did not make any thousand being home, does not do any exercises and states most of the day, he has been tried on different antidepressants with no significant effect on range of motion but resulted  in significant fatigue.   Past Medical History:  Diagnosis Date   Anxiety    Borderline diabetes    CAD (coronary artery disease)    a. By cath 08/2013: 70% distal cx-prox LPDA; tandem mod LAD lesions, o/w mild dz.   CKD (chronic kidney disease), stage II    his creat. runs 2-2.5; hasnt seen neph yet - sees dr. Tresa Moore at [facility]   Complete heart block [facility])    a. s/p PPM    Deafness in left ear    Depression    Diabetes mellitus without complication ([place])    Diverticulitis of colon 15 years ago   Hernia    HTN (hypertension)    Hyperlipidemia    Hypogonadism male    Hypothyroidism    Lung nodule seen on imaging study 04/10/2014   5 mm nodule right lower lobe   Mitral stenosis    a. Dx 07/2013 - through workup of 2D echo, TEE, and cath, felt to be moderate.   Obesity    OSA on CPAP    Persistent atrial fibrillation (HCC)    a. sustained 160-180 on e-CARDIO monitor placed 07/29/2013. Placed on IV amiodarone at end of May 2015 due to continued paroxysms with RVR.   Renal cell carcinoma of right kidney ([place]) 09/05/2013   clear cell renal cell carcinoma of right kidney treated by radical nephrectomy, Fuhrman grade 2-3, with maximum tumor diameter 2.7 cm, all tumor to find confined to the kidney, and all surgical margins negative (T1aN0).    Renal mass    a. Dx 08/2013:  concerning for renal cancer.   Rheumatic heart disease    S/P Minimally invasive maze operation for atrial fibrillation 05/15/2014   Left side lesion set using cryothermy with clipping of LA appendage   S/P minimally invasive mitral valve replacement with metallic valve and maze procedure 05/15/2014   73m Sorin Carbomedics Optiform mechanical valve placed via right mini thoracotomy approach   [place]   "MRI showed 3 mild strokes"    Past Surgical History:  Procedure Laterality Date   EYE SURGERY Right 1990   "reconstructive, lens implant- from paintball accident"   INGUINAL HERNIA  REPAIR Bilateral first one in 826's  "I had one side done twice"   LEFT AND RIGHT HEART CATHETERIZATION WITH CORONARY ANGIOGRAM N/A 08/28/2013   Procedure: LEFT AND RIGHT HEART CATHETERIZATION WITH CORONARY ANGIOGRAM;  Surgeon: DLeonie Man MD;  [facility];  Service: Cardiovascular;  Laterality: N/A;   MINIMALLY INVASIVE MAZE PROCEDURE N/A 05/15/2014   Procedure: MINIMALLY INVASIVE MAZE PROCEDURE;  Surgeon: CRexene Alberts MD;  [place]  Service: Open Heart Surgery;  Laterality: N/A;   MITRAL VALVE REPLACEMENT Right 05/15/2014   Procedure: MINIMALLY INVASIVE MITRAL VALVE (MV) REPLACEMENT;  Surgeon: CRexene Alberts MD;  [place] [place]  Service: Open Heart Surgery;  Laterality: Right;   PERMANENT PACEMAKER INSERTION Left 05/22/2014   a. MDT dual chamber PPM implanted by Dr ARayann Hemanfor CHB s/p MAZE/MVR   ROBOT ASSISTED LAPAROSCOPIC NEPHRECTOMY Right 09/05/2013   Procedure: ROBOTIC ASSISTED LAPAROSCOPIC RIGHT RADICAL NEPHRECTOMY;  Surgeon: DSharyn Creamer MD;  Location[facility];  Service: Urology;  Laterality: Right;   TEE WITHOUT CARDIOVERSION N/A 08/16/2013   Procedure: TRANSESOPHAGEAL ECHOCARDIOGRAM (TEE);  Surgeon: KDorothy Spark MD;  [place] [facility]  Service: Cardiovascular;  Laterality: N/A;   TEE WITHOUT CARDIOVERSION N/A 05/15/2014   Procedure: TRANSESOPHAGEAL ECHOCARDIOGRAM (TEE);  Surgeon: CRexene Alberts MD;  [place] [facility]  Service: Open Heart Surgery;  Laterality: N/A;    Current Medications: Outpatient Medications Prior to Visit  Medication Sig Dispense Refill   acetaminophen (TYLENOL) 325 MG tablet Take 325 mg by mouth every 6 (six) hours as needed (pain).      albuterol (VENTOLIN HFA) 108 (90 Base) MCG/ACT inhaler Inhale 2 puffs into the lungs every 6 (six) hours as needed for wheezing or shortness of breath. 18 g 0   amLODipine (NORVASC) 10 MG tablet TAKE 1 TABLET (10 MG TOTAL) BY MOUTH DAILY. 90 tablet 2   aspirin EC 81 MG tablet Take  1 tablet (81 mg total) by mouth daily.     Blood Glucose Monitoring Suppl (ONE TOUCH ULTRA MINI) w/Device KIT Use meter to check blood sugars 1-4 times daily as instructed. 1 each 0   buPROPion (WELLBUTRIN SR) 150 MG 12 hr tablet TAKE 1 TABLET BY MOUTH TWICE A DAY 180 tablet 1   gabapentin (NEURONTIN) 400 MG capsule TAKE 1 CAPSULE (400 MG TOTAL) BY MOUTH AT BEDTIME. 90 capsule 1   glucose blood (ONE TOUCH ULTRA TEST) test strip Use one strip per test. Test blood sugars 1-4 times daily as instructed. 100 each 12   Lancets (ONETOUCH DELICA PLUS [id number] MISC USE 1 LANCET PER TEST. TEST BLOOD SUGARS 1 4 TIMES PER DAY AS INSTRUCTED.     Lancets (ONETOUCH DELICA PLUS [id number] MISC USE 1 LANCET PER TEST. TEST BLOOD SUGARS 1-4 TIMES PER DAY AS INSTRUCTED. 100 each 1   levothyroxine (SYNTHROID, LEVOTHROID) 75 MCG tablet Take 1 tablet (  75 mcg total) by mouth daily before breakfast. 90 tablet 2   loratadine (CLARITIN) 10 MG tablet Take 1 tablet (10 mg total) by mouth daily.     MELATONIN GUMMIES PO Take by mouth. Pt states taking 2 tablets 20 mg at night but making him more sleepy so taking 1 at night     metFORMIN (GLUCOPHAGE) 500 MG tablet TAKE 1 TABLET (500 MG TOTAL) BY MOUTH 2 (TWO) TIMES DAILY WITH A MEAL. 180 tablet 1   omega-3 acid ethyl esters (LOVAZA) 1 g capsule TAKE 2 CAPSULES (2 G TOTAL) BY MOUTH 2 (TWO) TIMES DAILY. 360 capsule 2   warfarin (COUMADIN) 5 MG tablet TAKE AS DIRECTED BY ANTICOAGULATION CLINIC 120 tablet 1   metoprolol tartrate (LOPRESSOR) 25 MG tablet TAKE 0.5 TABLETS (12.5 MG TOTAL) BY MOUTH 2 (TWO) TIMES DAILY. 90 tablet 1   venlafaxine XR (EFFEXOR-XR) 37.5 MG 24 hr capsule TAKE ONE DAILY FOR ONE WEEK AND THEN INCREASE TO 2 DAILY. 180 capsule 1   No facility-administered medications prior to visit.      Allergies:   Contrast media [iodinated diagnostic agents], Ioxaglate, Atorvastatin, Crestor [rosuvastatin], Fenofibrate, Pravastatin, and Pseudoephedrine hcl    Social History   Socioeconomic History   Marital status: Married    Spouse name: Not on file   Number of children: 2   Years of education: 16   Highest education level: Not on file  Occupational History   Occupation: Retired     Comment: Clinical research associate strain: Not on file   Food insecurity    Worry: Not on file    Inability: Not on file   Transportation needs    Medical: Not on file    Non-medical: Not on file  Tobacco Use   Smoking status: Current Every Day Smoker    Packs/day: 0.75    Years: 50.00    Pack years: 37.50    Types: Cigarettes, Cigars   Smokeless tobacco: Never Used   Tobacco comment: Cigar every now and then  Substance and Sexual Activity   Alcohol use: No    Alcohol/week: 0.0 standard drinks   Drug use: No   Sexual activity: Not Currently  Lifestyle   Physical activity    Days per week: Not on file    Minutes per session: Not on file   Stress: Not on file  Relationships   Social connections    Talks on phone: Not on file    Gets together: Not on file    Attends religious service: Not on file    Active member of club or organization: Not on file    Attends meetings of clubs or organizations: Not on file    Relationship status: Not on file  Other Topics Concern   Not on file  Social History Narrative   Fun: Aggravate people.    Right hand    One story home    Family History:  The patient's family history includes Diabetes in his brother; Healthy in his daughter and son; Heart attack in his mother; Heart disease in his brother and father; Hypertension in his mother and another family member; Stroke in his mother.   ROS:   Please see the history of present illness.    ROS All other systems reviewed and are negative.  Physical Exam:    VS:  BP 138/86    Pulse 79    Ht 5' 8"  (1.727 m)    Wt 212 lb (  96.2 kg)    SpO2 98%    BMI 32.23 kg/m    GEN: Well nourished, well developed, in no acute  distress  HEENT: normal  Neck: no JVD, no masses Cardiac: mechanical S1, normal S2, RRR; no murmurs, rubs, or gallops, no edema;  Respiratory:  clear to auscultation bilaterally; no wheezing, rhonchi or rales GI: soft, nontender, nondistended MS: no deformity or atrophy  Skin: warm and dry Neuro: No focal deficits  Psych: Alert and oriented x 3, normal affect  Wt Readings from Last 3 Encounters:  01/17/19 212 lb (96.2 kg)  12/16/18 216 lb (98 kg)  11/11/18 218 lb 6 oz (99.1 kg)      Studies/Labs Reviewed:     EKG:  EKG is  ordered today.  The ekg ordered today demonstrates Normal sinus rhythm with right bundle branch block and left anterior fascicular block, unchanged from prior. This was personally reviewed.   Recent Labs: 04/08/2018: Magnesium 2.3 11/11/2018: ALT 53; BUN 22; Creatinine, Ser 1.58; Hemoglobin 15.5; Platelets 244.0; Potassium 4.4; Sodium 136; TSH 1.27  Labs at nephrologists office 06/13/15: BUN 17, creatinine 1.50, potassium 5, ALT 25  Recent Lipid Panel    Component Value Date/Time   CHOL 160 04/22/2017 0852   TRIG 307 (H) 04/22/2017 0852   HDL 22 (L) 04/22/2017 0852   CHOLHDL 7.3 (H) 04/22/2017 0852   CHOLHDL 8.4 (H) 03/19/2015 0756   VLDL 54 (H) 03/19/2015 0756   LDLCALC 77 04/22/2017 0852   LDLDIRECT 62.0 12/13/2014 0845   Additional studies/ records that were reviewed today include:   Echo 7/16 Mild LVH, EF 50-55%, normal wall motion, trivial AI, mechanical MVR with normal function, mean gradient 4 mmHg, mild to moderate LAE, normal RV function  Echo 4/16 Mild LVH, EF 45-50%, apical dyskinesis, trivial AI, mechanical MVR okay, severe LAE  Carotid US 04/16/14 Bilateral - 1% to 39% ICA stensosis.  TEE 05/30/38 Normal systolic function, normal wall motion, mild AI, moderate LAE Impressions: Rheumatic mitral valve with moderate leaflet tip thickening and minimal calcifications. Moderate mitral stenosis and trace mitral regurgitation.  TTE:  03/2018  Left ventricle: The cavity size was normal. There was moderate   concentric hypertrophy. Systolic function was normal. The   estimated ejection fraction was in the range of 60% to 65%. Features are consistent with a pseudonormal left   ventricular filling pattern, with concomitant abnormal relaxation   and increased filling pressure (grade 2 diastolic dysfunction).   - Mitral valve: Transvalvular velocity was within the normal range.   There was no evidence for stenosis. There was no regurgitation.  - Left atrium: The atrium was moderately dilated. - Pulmonary arteries: Systolic pressure was within the normal   range. PA peak pressure: 35 mm Hg (S).  Cardiac cath 08/28/13 LM: Normal LAD: Proximal 40-50%, distal 50-60% LCx: Small OM 70-80% (not amenable to PCI or CABG), PDA 70% LPDA: 10-20% RCA: Mid 30-40%   ASSESSMENT:     1. Dizziness   2. Fall, initial encounter     PLAN:     In order of problems listed above:  1. S/p Mechanical MVR - Echo in 03/2018 with normal LV function, well functioning mechanical MVR, normal transmitral gradients. Continue Coumadin.  He has no bleeding.  Continue SBE prophylaxis. No bleeding on Coumadin.   2. PAF - Maintaining NSR. 0% atrial fibrillation on pacemaker interrogation in 04/2017. Continue Coumadin.   3. HL - Continue current Rx Lovaza and Zetia, statin intolerant.  4. Essential hypertension -  mildly elevated, I will increase metoprolol to 25 mg p.o. twice daily.    5. CAD - Continue ASA, beta-blocker.  He is asymptomatic.  He is encouraged to start exercising daily.  6. OSA - Continue CPAP.   7.  Weakness, recurrent falls- most probably related to depression and absolute physical inactivity.  Again he is strongly advised to walk daily.  We will obtain carotid ultrasound to rule out carotid stenosis, he has no bruits.  Medication Adjustments/Labs and Tests Ordered: Current medicines are reviewed at length with the patient  today.  Concerns regarding medicines are outlined above.  Medication changes, Labs and Tests ordered today are outlined in the Patient Instructions noted below. Patient Instructions  Medication Instructions:   INCREASE YOUR METOPROLOL TARTRATE TO 25 MG BY MOUTH TWICE DAILY  *If you need a refill on your cardiac medications before your next appointment, please call your pharmacy*    Testing/Procedures:  Your physician has requested that you have a carotid duplex. This test is an ultrasound of the carotid arteries in your neck. It looks at blood flow through these arteries that supply the brain with blood. Allow one hour for this exam. There are no restrictions or special instructions.    Follow-Up: At [facility], you and your health needs are our priority.  As part of our continuing mission to provide you with exceptional heart care, we have created designated Provider Care Teams.  These Care Teams include your primary Cardiologist (physician) and Advanced Practice Providers (APPs -  Physician Assistants and Nurse Practitioners) who all work together to provide you with the care you need, when you need it.  Your next appointment:   6 months  The format for your next appointment:   Either In Person or Virtual  Provider:   Ena Dawley, MD     Signed, Ena Dawley, MD  01/17/2019 10:25 AM    [address], [place], [place]  [id number] Phone: [phone]; Fax: [phone]

## 2019-01-17 NOTE — Patient Instructions (Signed)
Description   Continue on same dosage 1 tablet daily except 1.5 tablets each Sundays, Tuesdays and Thursdays.  Repeat INR in 4 weeks

## 2019-01-18 ENCOUNTER — Ambulatory Visit (INDEPENDENT_AMBULATORY_CARE_PROVIDER_SITE_OTHER): Payer: PPO | Admitting: Psychiatry

## 2019-01-18 ENCOUNTER — Encounter: Payer: Self-pay | Admitting: Psychiatry

## 2019-01-18 DIAGNOSIS — F331 Major depressive disorder, recurrent, moderate: Secondary | ICD-10-CM

## 2019-01-18 MED ORDER — MIRTAZAPINE 15 MG PO TABS: 15.0000 mg | ORAL_TABLET | Freq: Every day | ORAL | 1 refills | Status: DC

## 2019-01-18 NOTE — Progress Notes (Signed)
Psychiatric Initial Adult Assessment   I connected with  Kevin English on 01/18/19 by a video enabled telemedicine application and verified that I am speaking with the correct person using two identifiers.   I discussed the limitations of evaluation and management by telemedicine. The patient expressed understanding and agreed to proceed.    Patient Identification: Kevin English MRN:  [id number] Date of Evaluation:  01/18/2019   Referral Source: Dr. Alfonso Ramus, [facility]  Chief Complaint:   As per wife, " He is depressed."  Visit Diagnosis:    ICD-10-CM   1. Moderate episode of recurrent major depressive disorder (HCC)  F33.1 mirtazapine (REMERON) 15 MG tablet    History of Present Illness: [name] with history of depression seen for psychiatric evaluation after being referred by his PCP.  Patient did not offer much during the session due to hearing impairment.  His wife was the main historian.  Wife informed that patient recently lost his hearing aide and as a result is not able to hear well.  Wife reported that for the past few years patient has had poor energy levels and no interest in anything whatsoever.  She said he prefers to sit or lay in bed and does not spend time with any family member.  She also reported that he gets upset when he sees her going out frequently.  Patient stated that he feels like he has no energy since he had his cardiac surgery.  He stated that he feels like he is not like himself since that procedure.  He also complained to his wife about not spending enough time with him.  Wife reported that there have been no changes in patient's appetite or sleeping patterns.  He is usually by himself most of the time.  Patient denied any hallucinations or delusions.  Wife stated that she sometimes wonders about his memory.  She has noticed subtle changes and memory impairment.  EMR reviewed showed that patient underwent brain imaging with SPECT in June  2020.  The findings were unremarkable including normal shaped striata.  He has been seen by neurology in August 2020 and neurological examination was unremarkable.  Wife informed that patient has been tried on multiple antidepressants.  EMR review showed that patient is taking Prozac, Paxil, Effexor, mirtazapine in the past.  Wife stated that the medicines only helped for a short timeframe.  Is currently on Wellbutrin and wife does not think it is doing much either.  Patient denied any suicidal ideations.  Wife stated that at times patient is set things like myself alive due to the way he feels.  She wonders if gabapentin makes him slow and drowsy.  He was started on it for restless legs and his legs.  She also wants him to undergo a complete thyroid panel test.  She was encouraged to discuss this with his PCP next week.  Associated Signs/Symptoms: Depression Symptoms: See HPI (Hypo) Manic Symptoms: Denied Anxiety Symptoms: Denied Psychotic Symptoms: Denied PTSD Symptoms: Denied  Past Psychiatric History: History of depression  Previous Psychotropic Medications: Yes   Substance Abuse History in the last 12 months:  No.  Consequences of Substance Abuse: Negative  Past Medical History:  Past Medical History:  Diagnosis Date  . Anxiety   . Borderline diabetes   . CAD (coronary artery disease)    a. By cath 08/2013: 70% distal cx-prox LPDA; tandem mod LAD lesions, o/w mild dz.  . CKD (chronic kidney disease), stage II  his creat. runs 2-2.5; hasnt seen neph yet - sees dr. Tresa Moore at [facility]  . Complete heart block (HCC)    a. s/p PPM   . Deafness in left ear   . Depression   . Diabetes mellitus without complication ([place])   . Diverticulitis of colon 15 years ago  . Hernia   . HTN (hypertension)   . Hyperlipidemia   . Hypogonadism male   . Hypothyroidism   . Lung nodule seen on imaging study 04/10/2014   5 mm nodule right lower lobe  . Mitral stenosis    a. Dx 07/2013 - through  workup of 2D echo, TEE, and cath, felt to be moderate.  . Obesity   . OSA on CPAP   . Persistent atrial fibrillation ([place])    a. sustained 160-180 on e-CARDIO monitor placed 07/29/2013. Placed on IV amiodarone at end of May 2015 due to continued paroxysms with RVR.  Marland Kitchen Renal cell carcinoma of right kidney ([place]) 09/05/2013   clear cell renal cell carcinoma of right kidney treated by radical nephrectomy, Fuhrman grade 2-3, with maximum tumor diameter 2.7 cm, all tumor to find confined to the kidney, and all surgical margins negative (T1aN0).   . Renal mass    a. Dx 08/2013: concerning for renal cancer.  . Rheumatic heart disease   . S/P Minimally invasive maze operation for atrial fibrillation 05/15/2014   Left side lesion set using cryothermy with clipping of LA appendage  . S/P minimally invasive mitral valve replacement with metallic valve and maze procedure 05/15/2014   53m Sorin [employer] Optiform mechanical valve placed via right mini thoracotomy approach  . Stroke ([facility] 1998   "MRI showed 3 mild strokes"    Past Surgical History:  Procedure Laterality Date  . EYE SURGERY Right 1990   "reconstructive, lens implant- from paintball accident"  . INGUINAL HERNIA REPAIR Bilateral first one in 80's   "I had one side done twice"  . LEFT AND RIGHT HEART CATHETERIZATION WITH CORONARY ANGIOGRAM N/A 08/28/2013   Procedure: LEFT AND RIGHT HEART CATHETERIZATION WITH CORONARY ANGIOGRAM;  Surgeon: DLeonie Man MD;  [facility];  Service: Cardiovascular;  Laterality: N/A;  . MINIMALLY INVASIVE MAZE PROCEDURE N/A 05/15/2014   Procedure: MINIMALLY INVASIVE MAZE PROCEDURE;  Surgeon: CRexene Alberts MD;  [place]  Service: Open Heart Surgery;  Laterality: N/A;  . MITRAL VALVE REPLACEMENT Right 05/15/2014   Procedure: MINIMALLY INVASIVE MITRAL VALVE (MV) REPLACEMENT;  Surgeon: CRexene Alberts MD;  [place]  Service: Open Heart Surgery;  Laterality: Right;  . PERMANENT PACEMAKER  INSERTION Left 05/22/2014   a. MDT dual chamber PPM implanted by Dr ARayann Hemanfor CHB s/p MAZE/MVR  . ROBOT ASSISTED LAPAROSCOPIC NEPHRECTOMY Right 09/05/2013   Procedure: ROBOTIC ASSISTED LAPAROSCOPIC RIGHT RADICAL NEPHRECTOMY;  Surgeon: DSharyn Creamer MD;  [facility];  Service: Urology;  Laterality: Right;  . TEE WITHOUT CARDIOVERSION N/A 08/16/2013   Procedure: TRANSESOPHAGEAL ECHOCARDIOGRAM (TEE);  Surgeon: KDorothy Spark MD;  [place]  Service: Cardiovascular;  Laterality: N/A;  . TEE WITHOUT CARDIOVERSION N/A 05/15/2014   Procedure: TRANSESOPHAGEAL ECHOCARDIOGRAM (TEE);  Surgeon: CRexene Alberts MD;  [place] [facility]  Service: Open Heart Surgery;  Laterality: N/A;    Family Psychiatric History: denied  Family History:  Family History  Problem Relation Age of Onset  . Hypertension Mother   . Heart attack Mother   . Stroke Mother   . Heart disease Father   .  Hypertension Other   . Heart disease Brother   . Diabetes Brother   . Healthy Son   . Healthy Daughter     Social History:   Social History   Socioeconomic History  . Marital status: Married    Spouse name: Not on file  . Number of children: 2  . Years of education: 85  . Highest education level: Not on file  Occupational History  . Occupation: Retired     Comment: Careers information officer  . Financial resource strain: Not on file  . Food insecurity    Worry: Not on file    Inability: Not on file  . Transportation needs    Medical: Not on file    Non-medical: Not on file  Tobacco Use  . Smoking status: Current Every Day Smoker    Packs/day: 0.75    Years: 50.00    Pack years: 37.50    Types: Cigarettes, Cigars  . Smokeless tobacco: Never Used  . Tobacco comment: Cigar every now and then  Substance and Sexual Activity  . Alcohol use: No    Alcohol/week: 0.0 standard drinks  . Drug use: No  . Sexual activity: Not Currently  Lifestyle  . Physical activity    Days per week: Not on  file    Minutes per session: Not on file  . Stress: Not on file  Relationships  . Social Herbalist on phone: Not on file    Gets together: Not on file    Attends religious service: Not on file    Active member of club or organization: Not on file    Attends meetings of clubs or organizations: Not on file    Relationship status: Not on file  Other Topics Concern  . Not on file  Social History Narrative   Fun: Aggravate people.    Right hand    One story home    Additional Social History: Lives with wife  Allergies:   Allergies  Allergen Reactions  . Contrast Media [Iodinated Diagnostic Agents] Hives and Other (See Comments)    Patient started sneezing and coughing, patient broke out in hives, patient needs 13 hour prep if having IV contrast  . Ioxaglate Hives and Rash    Patient started sneezing and coughing, patient broke out in hives, patient needs 13 hour prep if having IV contrast  . Atorvastatin Other (See Comments)    Myalgias  . Crestor [Rosuvastatin] Other (See Comments)    Myalgias  . Fenofibrate Other (See Comments)    MYALGIAS   . Pravastatin Other (See Comments)    Myalgias   . Pseudoephedrine Hcl Palpitations    Metabolic Disorder Labs: Lab Results  Component Value Date   HGBA1C 8.1 (H) 11/11/2018   MPG 140 05/11/2014   MPG 137 (H) 04/16/2014   No results found for: PROLACTIN Lab Results  Component Value Date   CHOL 160 04/22/2017   TRIG 307 (H) 04/22/2017   HDL 22 (L) 04/22/2017   CHOLHDL 7.3 (H) 04/22/2017   VLDL 54 (H) 03/19/2015   LDLCALC 77 04/22/2017   LDLCALC 86 03/19/2015   Lab Results  Component Value Date   TSH 1.27 11/11/2018    Therapeutic Level Labs: No results found for: LITHIUM No results found for: CBMZ No results found for: VALPROATE  Current Medications: Current Outpatient Medications  Medication Sig Dispense Refill  . acetaminophen (TYLENOL) 325 MG tablet Take 325 mg by mouth every 6 (six) hours as  needed (pain).     Marland Kitchen albuterol (VENTOLIN HFA) 108 (90 Base) MCG/ACT inhaler Inhale 2 puffs into the lungs every 6 (six) hours as needed for wheezing or shortness of breath. 18 g 0  . amLODipine (NORVASC) 10 MG tablet TAKE 1 TABLET (10 MG TOTAL) BY MOUTH DAILY. 90 tablet 2  . aspirin EC 81 MG tablet Take 1 tablet (81 mg total) by mouth daily.    . Blood Glucose Monitoring Suppl (ONE TOUCH ULTRA MINI) w/Device KIT Use meter to check blood sugars 1-4 times daily as instructed. 1 each 0  . buPROPion (WELLBUTRIN SR) 150 MG 12 hr tablet TAKE 1 TABLET BY MOUTH TWICE A DAY 180 tablet 1  . gabapentin (NEURONTIN) 400 MG capsule TAKE 1 CAPSULE (400 MG TOTAL) BY MOUTH AT BEDTIME. 90 capsule 1  . glucose blood (ONE TOUCH ULTRA TEST) test strip Use one strip per test. Test blood sugars 1-4 times daily as instructed. 100 each 12  . Lancets (ONETOUCH DELICA PLUS [id number]) MISC USE 1 LANCET PER TEST. TEST BLOOD SUGARS 1 4 TIMES PER DAY AS INSTRUCTED.    . Lancets (ONETOUCH DELICA PLUS [id number]) MISC USE 1 LANCET PER TEST. TEST BLOOD SUGARS 1-4 TIMES PER DAY AS INSTRUCTED. 100 each 1  . levothyroxine (SYNTHROID, LEVOTHROID) 75 MCG tablet Take 1 tablet (75 mcg total) by mouth daily before breakfast. 90 tablet 2  . loratadine (CLARITIN) 10 MG tablet Take 1 tablet (10 mg total) by mouth daily.    Marland Kitchen MELATONIN GUMMIES PO Take by mouth. Pt states taking 2 tablets 20 mg at night but making him more sleepy so taking 1 at night    . metFORMIN (GLUCOPHAGE) 500 MG tablet TAKE 1 TABLET (500 MG TOTAL) BY MOUTH 2 (TWO) TIMES DAILY WITH A MEAL. 180 tablet 1  . metoprolol tartrate (LOPRESSOR) 25 MG tablet Take 1 tablet (25 mg total) by mouth 2 (two) times daily. 180 tablet 3  . mirtazapine (REMERON) 15 MG tablet Take 1 tablet (15 mg total) by mouth at bedtime. 30 tablet 1  . omega-3 acid ethyl esters (LOVAZA) 1 g capsule TAKE 2 CAPSULES (2 G TOTAL) BY MOUTH 2 (TWO) TIMES DAILY. 360 capsule 2  . warfarin (COUMADIN) 5 MG tablet TAKE  AS DIRECTED BY ANTICOAGULATION CLINIC 120 tablet 1   No current facility-administered medications for this visit.     Musculoskeletal: Strength & Muscle Tone: unable to assess due to telemed visit Gait & Station: unable to assess due to telemed visit Patient leans: unable to assess due to telemed visit   Psychiatric Specialty Exam: ROS  There were no vitals taken for this visit.There is no height or weight on file to calculate BMI.  General Appearance: Fairly Groomed  Eye Contact:  Good  Speech:  Normal Rate  Volume:  Normal  Mood:  Euthymic  Affect:  Appropriate and Restricted  Thought Process:  Goal Directed, Linear and Descriptions of Associations: Intact  Orientation:  Full (Time, Place, and Person)  Thought Content:  Logical  Suicidal Thoughts:  No  Homicidal Thoughts:  No  Memory:  Recent;   Fair Remote;   Fair  Judgement:  Fair  Insight:  Fair  Psychomotor Activity:  Normal  Concentration:  Concentration: Fair and Attention Span: Fair  Recall:  [employer] of Knowledge:Fair  Language: Good  Akathisia:  No  Handed:  Right  AIMS (if indicated):  Not done  Assets:  Agricultural consultant Housing Social Support Transportation  ADL's:  Intact  Cognition: WNL  Sleep:  Fair   Screenings: GAD-7     Office Visit from 11/11/2018 in [place] Visit from 12/09/2017 in [place] Visit from 11/08/2017 in [place] Visit from 10/25/2017 in [place] Visit from 10/04/2017 in [place]  Total GAD-7 Score  0  0  0  0  9    PHQ2-9     Office Visit from 11/11/2018 in [place] from 07/06/2018 in [place] Visit from 12/09/2017 in [place] Visit from 11/08/2017 in [place] Visit from  10/25/2017 in [place]  PHQ-2 Total Score  4  2  0  0  1  PHQ-9 Total Score  19  8  0  5  7      Assessment and Plan: 62 year old male seen for ongoing depressive symptoms.  His wife provided most of the history.  As per wife none of the medications he has tried have helped much.  He has tried low-dose mirtazapine in the past for a short timeframe, wife and patient agreed to retry it along with bupropion.  Potential side effects of medication and risks vs benefits of treatment vs non-treatment were explained and discussed. All questions were answered. Will avoid other SSRIs due to concomitant Coumadin use. Wife also mentioned some memory impairment, need to rule out if depression attributing to memory dysfunction.  1. Moderate episode of recurrent major depressive disorder (HCC)  - Start mirtazapine (REMERON) 15 MG tablet; Take 1 tablet (15 mg total) by mouth at bedtime.  Dispense: 30 tablet; Refill: 1 - Continue Wellbutrin 150 mg BID.  F/up in 6 weeks.    [place] Crane, MD 10/28/20203:18 PM

## 2019-01-20 ENCOUNTER — Telehealth: Payer: Self-pay

## 2019-01-20 NOTE — Telephone Encounter (Signed)

## 2019-01-22 ENCOUNTER — Other Ambulatory Visit: Payer: Self-pay | Admitting: Family Medicine

## 2019-01-23 ENCOUNTER — Other Ambulatory Visit: Payer: Self-pay

## 2019-01-23 ENCOUNTER — Encounter: Payer: Self-pay | Admitting: Family Medicine

## 2019-01-23 ENCOUNTER — Ambulatory Visit (INDEPENDENT_AMBULATORY_CARE_PROVIDER_SITE_OTHER): Payer: PPO | Admitting: Family Medicine

## 2019-01-23 VITALS — BP 126/70 | HR 90 | Ht 68.0 in | Wt 217.0 lb

## 2019-01-23 DIAGNOSIS — Z720 Tobacco use: Secondary | ICD-10-CM | POA: Insufficient documentation

## 2019-01-23 DIAGNOSIS — N183 Chronic kidney disease, stage 3 unspecified: Secondary | ICD-10-CM

## 2019-01-23 DIAGNOSIS — F322 Major depressive disorder, single episode, severe without psychotic features: Secondary | ICD-10-CM

## 2019-01-23 DIAGNOSIS — E1122 Type 2 diabetes mellitus with diabetic chronic kidney disease: Secondary | ICD-10-CM | POA: Diagnosis not present

## 2019-01-23 DIAGNOSIS — R5381 Other malaise: Secondary | ICD-10-CM | POA: Diagnosis not present

## 2019-01-23 NOTE — Progress Notes (Signed)
Remote pacemaker transmission.   

## 2019-01-23 NOTE — Progress Notes (Addendum)
Established Patient Office Visit  Subjective:  Patient ID: Kevin English, male    DOB: 07-16-1955  Age: 63 y.o. MRN: [id number]  CC:  Chief Complaint  Patient presents with  . Follow-up    HPI Kevin English presents for follow-up of his depression.  Patient is seeing psychiatry and they discontinued the Effexor and started him on Remeron.  However he has not started the Remeron as of yet.  He is having episodes of periodic weakness.  He falls and is unable to pick himself up off the floor.  Patient has seen neurology for this issue and they have cleared him from a neurological standpoint.  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  Patient continues to smoke when he can.  Assures me that he is taking his Glucophage twice daily.  Patient's vitamin D level was borderline low and had started him on high-dose vitamin D.  The nephrology discontinued this secondary to the patient's elevated calcium levels.  Agree with this decision.  Past Medical History:  Diagnosis Date  . Anxiety   . Borderline diabetes   . CAD (coronary artery disease)    a. By cath 08/2013: 70% distal cx-prox LPDA; tandem mod LAD lesions, o/w mild dz.  . CKD (chronic kidney disease), stage II    his creat. runs 2-2.5; hasnt seen neph yet - sees dr. Tresa [facility]  . Complete heart block (HCC)    a. s/p PPM   . Deafness in left ear   . Depression   . Diabetes mellitus without complication ([place])   . Diverticulitis of colon 15 years ago  . Hernia   . HTN (hypertension)   . Hyperlipidemia   . Hypogonadism male   . Hypothyroidism   . Lung nodule seen on imaging study 04/10/2014   5 mm nodule right lower lobe  . Mitral stenosis    a. Dx 07/2013 - through workup of 2D echo, TEE, and cath, felt to be moderate.  . Obesity   . OSA on CPAP   . Persistent atrial fibrillation ([place])    a. sustained 160-180 on e-CARDIO monitor placed 07/29/2013. Placed on IV amiodarone at end of May 2015 due to  continued paroxysms with RVR.  Marland Kitchen Renal cell carcinoma of right kidney ([place]) 09/05/2013   clear cell renal cell carcinoma of right kidney treated by radical nephrectomy, Fuhrman grade 2-3, with maximum tumor diameter 2.7 cm, all tumor to find confined to the kidney, and all surgical margins negative (T1aN0).   . Renal mass    a. Dx 08/2013: concerning for renal cancer.  . Rheumatic heart disease   . S/P Minimally invasive maze operation for atrial fibrillation 05/15/2014   Left side lesion set using cryothermy with clipping of LA appendage  . S/P minimally invasive mitral valve replacement with metallic valve and maze procedure 05/15/2014   33m Sorin [employer]omedics Optiform mechanical valve placed via right mini thoracotomy approach  . Stroke ([facility] 1998   "MRI showed 3 mild strokes"    Past Surgical History:  Procedure Laterality Date  . EYE SURGERY Right 1990   "reconstructive, lens implant- from paintball accident"  . INGUINAL HERNIA REPAIR Bilateral first one in 80's   "I had one side done twice"  . LEFT AND RIGHT HEART CATHETERIZATION WITH CORONARY ANGIOGRAM N/A 08/28/2013   Procedure: LEFT AND RIGHT HEART CATHETERIZATION WITH CORONARY ANGIOGRAM;  Surgeon: DLeonie Man MD;  [facility];  Service: Cardiovascular;  Laterality: N/A;  . MINIMALLY INVASIVE MAZE PROCEDURE N/A 05/15/2014   Procedure: MINIMALLY INVASIVE MAZE PROCEDURE;  Surgeon: Rexene Alberts, MD;  Location: [place];  Service: Open Heart Surgery;  Laterality: N/A;  . MITRAL VALVE REPLACEMENT Right 05/15/2014   Procedure: MINIMALLY INVASIVE MITRAL VALVE (MV) REPLACEMENT;  Surgeon: Rexene Alberts, MD;  Location: [place];  Service: Open Heart Surgery;  Laterality: Right;  . PERMANENT PACEMAKER INSERTION Left 05/22/2014   a. MDT dual chamber PPM implanted by Dr Rayann Heman for CHB s/p MAZE/MVR  . ROBOT ASSISTED LAPAROSCOPIC NEPHRECTOMY Right 09/05/2013   Procedure: ROBOTIC ASSISTED LAPAROSCOPIC RIGHT RADICAL NEPHRECTOMY;  Surgeon:  Sharyn Creamer, MD;  Location: [facility];  Service: Urology;  Laterality: Right;  . TEE WITHOUT CARDIOVERSION N/A 08/16/2013   Procedure: TRANSESOPHAGEAL ECHOCARDIOGRAM (TEE);  Surgeon: Dorothy Spark, MD;  Location: [place];  Service: Cardiovascular;  Laterality: N/A;  . TEE WITHOUT CARDIOVERSION N/A 05/15/2014   Procedure: TRANSESOPHAGEAL ECHOCARDIOGRAM (TEE);  Surgeon: Rexene Alberts, MD;  Location: [place];  Service: Open Heart Surgery;  Laterality: N/A;    Family History  Problem Relation Age of Onset  . Hypertension Mother   . Heart attack Mother   . Stroke Mother   . Heart disease Father   . Hypertension Other   . Heart disease Brother   . Diabetes Brother   . Healthy Son   . Healthy Daughter     Social History   Socioeconomic History  . Marital status: Married    Spouse name: Not on file  . Number of children: 2  . Years of education: 5  . Highest education level: Not on file  Occupational History  . Occupation: Retired     Comment: Careers information officer  . Financial resource strain: Not on file  . Food insecurity    Worry: Not on file    Inability: Not on file  . Transportation needs    Medical: Not on file    Non-medical: Not on file  Tobacco Use  . Smoking status: Current Every Day Smoker    Packs/day: 0.75    Years: 50.00    Pack years: 37.50    Types: Cigarettes, Cigars  . Smokeless tobacco: Never Used  . Tobacco comment: Cigar every now and then  Substance and Sexual Activity  . Alcohol use: No    Alcohol/week: 0.0 standard drinks  . Drug use: No  . Sexual activity: Not Currently  Lifestyle  . Physical activity    Days per week: Not on file    Minutes per session: Not on file  . Stress: Not on file  Relationships  . Social Herbalist on phone: Not on file    Gets together: Not on file    Attends religious service: Not on file    Active member of club or organization: Not on file    Attends meetings of clubs or  organizations: Not on file    Relationship status: Not on file  . Intimate partner violence    Fear of current or ex partner: Not on file    Emotionally abused: Not on file    Physically abused: Not on file    Forced sexual activity: Not on file  Other Topics Concern  . Not on file  Social History Narrative   Fun: Aggravate people.    Right hand    One story home    Outpatient Medications Prior to  Visit  Medication Sig Dispense Refill  . acetaminophen (TYLENOL) 325 MG tablet Take 325 mg by mouth every 6 (six) hours as needed (pain).     Marland Kitchen albuterol (VENTOLIN HFA) 108 (90 Base) MCG/ACT inhaler Inhale 2 puffs into the lungs every 6 (six) hours as needed for wheezing or shortness of breath. 18 g 0  . amLODipine (NORVASC) 10 MG tablet TAKE 1 TABLET (10 MG TOTAL) BY MOUTH DAILY. 90 tablet 2  . aspirin EC 81 MG tablet Take 1 tablet (81 mg total) by mouth daily.    . Blood Glucose Monitoring Suppl (ONE TOUCH ULTRA MINI) w/Device KIT Use meter to check blood sugars 1-4 times daily as instructed. 1 each 0  . buPROPion (WELLBUTRIN SR) 150 MG 12 hr tablet TAKE 1 TABLET BY MOUTH TWICE A DAY 180 tablet 1  . gabapentin (NEURONTIN) 400 MG capsule TAKE 1 CAPSULE (400 MG TOTAL) BY MOUTH AT BEDTIME. 90 capsule 1  . glucose blood (ONE TOUCH ULTRA TEST) test strip Use one strip per test. Test blood sugars 1-4 times daily as instructed. 100 each 12  . Lancets (ONETOUCH DELICA PLUS [id number]) MISC USE 1 LANCET PER TEST. TEST BLOOD SUGARS 1 4 TIMES PER DAY AS INSTRUCTED.    Marland Kitchen levothyroxine (SYNTHROID, LEVOTHROID) 75 MCG tablet Take 1 tablet (75 mcg total) by mouth daily before breakfast. 90 tablet 2  . loratadine (CLARITIN) 10 MG tablet Take 1 tablet (10 mg total) by mouth daily.    Marland Kitchen MELATONIN GUMMIES PO Take by mouth. Pt states taking 2 tablets 20 mg at night but making him more sleepy so taking 1 at night    . metFORMIN (GLUCOPHAGE) 500 MG tablet TAKE 1 TABLET (500 MG TOTAL) BY MOUTH 2 (TWO) TIMES DAILY  WITH A MEAL. 180 tablet 1  . metoprolol tartrate (LOPRESSOR) 25 MG tablet Take 1 tablet (25 mg total) by mouth 2 (two) times daily. 180 tablet 3  . mirtazapine (REMERON) 15 MG tablet Take 1 tablet (15 mg total) by mouth at bedtime. 30 tablet 1  . omega-3 acid ethyl esters (LOVAZA) 1 g capsule TAKE 2 CAPSULES (2 G TOTAL) BY MOUTH 2 (TWO) TIMES DAILY. 360 capsule 2  . OneTouch Delica Lancets 70L MISC USE TO TEST BLOOD SUGARS 1-4 TIMES PER DAY AS INSTRUCTED 100 each 1  . warfarin (COUMADIN) 5 MG tablet TAKE AS DIRECTED BY ANTICOAGULATION CLINIC 120 tablet 1   No facility-administered medications prior to visit.     Allergies  Allergen Reactions  . Contrast Media [Iodinated Diagnostic Agents] Hives and Other (See Comments)    Patient started sneezing and coughing, patient broke out in hives, patient needs 13 hour prep if having IV contrast  . Ioxaglate Hives and Rash    Patient started sneezing and coughing, patient broke out in hives, patient needs 13 hour prep if having IV contrast  . Atorvastatin Other (See Comments)    Myalgias  . Crestor [Rosuvastatin] Other (See Comments)    Myalgias  . Fenofibrate Other (See Comments)    MYALGIAS   . Pravastatin Other (See Comments)    Myalgias   . Pseudoephedrine Hcl Palpitations    ROS Review of Systems  Constitutional: Negative.   HENT: Positive for hearing loss.   Eyes: Negative for photophobia and visual disturbance.  Respiratory: Negative.   Cardiovascular: Negative.   Gastrointestinal: Negative.   Endocrine: Negative for polyphagia and polyuria.  Genitourinary: Negative.   Musculoskeletal: Positive for gait problem.  Skin: Negative for pallor  and rash.  Allergic/Immunologic: Negative for immunocompromised state.  Neurological: Negative for light-headedness and headaches.  Hematological: Does not bruise/bleed easily.  Psychiatric/Behavioral: Negative.       Objective:    Physical Exam  Constitutional: He is oriented to  person, place, and time. He appears well-developed and well-nourished. No distress.  HENT:  Head: Normocephalic and atraumatic.  Right Ear: External ear normal.  Left Ear: External ear normal.  Eyes: Conjunctivae are normal. Right eye exhibits no discharge. Left eye exhibits no discharge. No scleral icterus.  Neck: No JVD present. No tracheal deviation present.  Pulmonary/Chest: No stridor.  Neurological: He is alert and oriented to person, place, and time.  Skin: Skin is warm and dry. He is not diaphoretic.  Psychiatric: He has a normal mood and affect. His behavior is normal.    BP 126/70   Pulse 90   Ht 5' 8"  (1.727 m)   Wt 217 lb (98.4 kg)   SpO2 96%   BMI 32.99 kg/m  Wt Readings from Last 3 Encounters:  01/23/19 217 lb (98.4 kg)  01/17/19 212 lb (96.2 kg)  12/16/18 216 lb (98 kg)   BP Readings from Last 3 Encounters:  01/23/19 126/70  01/17/19 138/86  12/16/18 128/80   Guideline developer:  UpToDate (see UpToDate for funding source) Date Released: June 2014  Health Maintenance Due  Topic Date Due  . COLONOSCOPY  05/18/2003  . TETANUS/TDAP  03/23/2014  . FOOT EXAM  10/22/2017  . PNA vac Low Risk Adult (1 of 2 - PCV13) 05/17/2018  . INFLUENZA VACCINE  10/22/2018  . OPHTHALMOLOGY EXAM  01/07/2019    There are no preventive care reminders to display for this patient.  Lab Results  Component Value Date   TSH 1.27 11/11/2018   Lab Results  Component Value Date   WBC 12.1 (H) 11/11/2018   HGB 15.5 11/11/2018   HCT 46.6 11/11/2018   MCV 96.4 11/11/2018   PLT 244.0 11/11/2018   Lab Results  Component Value Date   NA 136 11/11/2018   K 4.4 11/11/2018   CHLORIDE 109 01/02/2016   CO2 22 11/11/2018   GLUCOSE 215 (H) 11/11/2018   BUN 22 11/11/2018   CREATININE 1.58 (H) 11/11/2018   BILITOT 0.4 11/11/2018   ALKPHOS 79 11/11/2018   AST 26 11/11/2018   ALT 53 11/11/2018   PROT 6.7 11/11/2018   ALBUMIN 4.3 11/11/2018   CALCIUM 9.7 11/11/2018   ANIONGAP 8  03/03/2016   EGFR 49 (L) 01/02/2016   GFR 44.17 (L) 11/11/2018   Lab Results  Component Value Date   CHOL 160 04/22/2017   Lab Results  Component Value Date   HDL 22 (L) 04/22/2017   Lab Results  Component Value Date   LDLCALC 77 04/22/2017   Lab Results  Component Value Date   TRIG 307 (H) 04/22/2017   Lab Results  Component Value Date   CHOLHDL 7.3 (H) 04/22/2017   Lab Results  Component Value Date   HGBA1C 8.1 (H) 11/11/2018      Assessment & Plan:   Problem List Items Addressed This Visit      Endocrine   Controlled type 2 diabetes mellitus with stage 3 chronic kidney disease, without long-term current use of insulin ([place])     Other   Physical debility   Relevant Orders   Ambulatory referral to Sports Medicine   Depression, major, single episode, severe ([place]) - Primary   Tobacco use      No  orders of the defined types were placed in this encounter.   Follow-up: Return in about 1 month (around 02/22/2019).   Believe the patient's periodic weakness could possibly be more related to depression and debility.  Wife is concerned about his calcium levels and the possibility of a possible parathyroid disorder.  Cardiology PA has ordered PTH levels and will be sending those along to me.  We will recheck hemoglobin A1c in 1 month.  Have asked the wife to remove cigarettes from the home.  It is simply not safe for the patient to smoke.  Sports medicine referral for their input on patient's periodic weakness/debility.  He will start the Remeron as soon as he can.  I spent over 30 minutes with this patient with greater than 50% of the time spent in counseling.

## 2019-01-25 DIAGNOSIS — N1831 Chronic kidney disease, stage 3a: Secondary | ICD-10-CM | POA: Diagnosis not present

## 2019-01-26 ENCOUNTER — Inpatient Hospital Stay (HOSPITAL_COMMUNITY): Admission: RE | Admit: 2019-01-26 | Payer: PPO | Source: Ambulatory Visit

## 2019-02-07 ENCOUNTER — Ambulatory Visit: Payer: PPO | Admitting: Psychiatry

## 2019-02-07 ENCOUNTER — Encounter

## 2019-02-10 ENCOUNTER — Encounter: Payer: Self-pay | Admitting: Family Medicine

## 2019-02-10 DIAGNOSIS — T466X5A Adverse effect of antihyperlipidemic and antiarteriosclerotic drugs, initial encounter: Secondary | ICD-10-CM | POA: Insufficient documentation

## 2019-02-10 DIAGNOSIS — G720 Drug-induced myopathy: Secondary | ICD-10-CM | POA: Insufficient documentation

## 2019-02-14 ENCOUNTER — Ambulatory Visit (INDEPENDENT_AMBULATORY_CARE_PROVIDER_SITE_OTHER): Payer: PPO | Admitting: Pharmacist

## 2019-02-14 ENCOUNTER — Other Ambulatory Visit: Payer: Self-pay

## 2019-02-14 ENCOUNTER — Other Ambulatory Visit (INDEPENDENT_AMBULATORY_CARE_PROVIDER_SITE_OTHER): Payer: PPO

## 2019-02-14 DIAGNOSIS — I050 Rheumatic mitral stenosis: Secondary | ICD-10-CM

## 2019-02-14 DIAGNOSIS — I4891 Unspecified atrial fibrillation: Secondary | ICD-10-CM | POA: Diagnosis not present

## 2019-02-14 DIAGNOSIS — Z954 Presence of other heart-valve replacement: Secondary | ICD-10-CM | POA: Diagnosis not present

## 2019-02-14 DIAGNOSIS — Z0000 Encounter for general adult medical examination without abnormal findings: Secondary | ICD-10-CM | POA: Diagnosis not present

## 2019-02-14 DIAGNOSIS — I5033 Acute on chronic diastolic (congestive) heart failure: Secondary | ICD-10-CM

## 2019-02-14 LAB — POCT INR: INR: 3.9 — AB (ref 2.0–3.0)

## 2019-02-14 NOTE — Patient Instructions (Signed)
Hold coumadin today then continue on same dosage 1 tablet daily except 1.5 tablets each Sundays, Tuesdays and Thursdays.  Repeat INR in 3 weeks

## 2019-02-21 ENCOUNTER — Ambulatory Visit: Payer: PPO | Admitting: Family Medicine

## 2019-02-24 DIAGNOSIS — G4733 Obstructive sleep apnea (adult) (pediatric): Secondary | ICD-10-CM | POA: Diagnosis not present

## 2019-03-01 ENCOUNTER — Ambulatory Visit: Payer: PPO | Admitting: Psychiatry

## 2019-03-07 ENCOUNTER — Ambulatory Visit (INDEPENDENT_AMBULATORY_CARE_PROVIDER_SITE_OTHER): Payer: PPO | Admitting: *Deleted

## 2019-03-07 ENCOUNTER — Other Ambulatory Visit: Payer: Self-pay | Admitting: Cardiology

## 2019-03-07 ENCOUNTER — Other Ambulatory Visit: Payer: Self-pay

## 2019-03-07 ENCOUNTER — Other Ambulatory Visit (INDEPENDENT_AMBULATORY_CARE_PROVIDER_SITE_OTHER): Payer: PPO

## 2019-03-07 DIAGNOSIS — I4891 Unspecified atrial fibrillation: Secondary | ICD-10-CM | POA: Diagnosis not present

## 2019-03-07 DIAGNOSIS — I050 Rheumatic mitral stenosis: Secondary | ICD-10-CM

## 2019-03-07 DIAGNOSIS — Z954 Presence of other heart-valve replacement: Secondary | ICD-10-CM | POA: Diagnosis not present

## 2019-03-07 DIAGNOSIS — Z0000 Encounter for general adult medical examination without abnormal findings: Secondary | ICD-10-CM

## 2019-03-07 DIAGNOSIS — I5033 Acute on chronic diastolic (congestive) heart failure: Secondary | ICD-10-CM | POA: Diagnosis not present

## 2019-03-07 LAB — POCT INR: INR: 3.5 — AB (ref 2.0–3.0)

## 2019-03-07 NOTE — Patient Instructions (Signed)
Continue warfarin 1 tablet daily except 1.5 tablets each Sundays, Tuesdays and Thursdays.  Continue greens/salads  Repeat INR in 4 weeks

## 2019-03-15 ENCOUNTER — Other Ambulatory Visit: Payer: Self-pay | Admitting: Family Medicine

## 2019-03-15 DIAGNOSIS — E039 Hypothyroidism, unspecified: Secondary | ICD-10-CM

## 2019-04-04 ENCOUNTER — Ambulatory Visit (INDEPENDENT_AMBULATORY_CARE_PROVIDER_SITE_OTHER): Payer: PPO | Admitting: Cardiology

## 2019-04-04 ENCOUNTER — Other Ambulatory Visit (INDEPENDENT_AMBULATORY_CARE_PROVIDER_SITE_OTHER): Payer: PPO

## 2019-04-04 ENCOUNTER — Other Ambulatory Visit: Payer: Self-pay

## 2019-04-04 DIAGNOSIS — I5033 Acute on chronic diastolic (congestive) heart failure: Secondary | ICD-10-CM | POA: Diagnosis not present

## 2019-04-04 DIAGNOSIS — I4891 Unspecified atrial fibrillation: Secondary | ICD-10-CM

## 2019-04-04 DIAGNOSIS — I050 Rheumatic mitral stenosis: Secondary | ICD-10-CM

## 2019-04-04 DIAGNOSIS — Z0000 Encounter for general adult medical examination without abnormal findings: Secondary | ICD-10-CM | POA: Diagnosis not present

## 2019-04-04 DIAGNOSIS — Z954 Presence of other heart-valve replacement: Secondary | ICD-10-CM | POA: Diagnosis not present

## 2019-04-04 LAB — POCT INR: INR: 2.9 (ref 2.0–3.0)

## 2019-04-04 NOTE — Patient Instructions (Signed)
Continue warfarin 1 tablet daily except 1.5 tablets each Sundays, Tuesdays and Thursdays.  Continue greens/salads Repeat INR in 5 weeks

## 2019-04-13 ENCOUNTER — Ambulatory Visit (INDEPENDENT_AMBULATORY_CARE_PROVIDER_SITE_OTHER): Payer: PPO | Admitting: *Deleted

## 2019-04-13 DIAGNOSIS — I442 Atrioventricular block, complete: Secondary | ICD-10-CM | POA: Diagnosis not present

## 2019-04-13 LAB — CUP PACEART REMOTE DEVICE CHECK
Battery Impedance: 231 Ohm
Battery Remaining Longevity: 120 mo
Battery Voltage: 2.79 V
Brady Statistic AP VP Percent: 0 %
Brady Statistic AP VS Percent: 53 %
Brady Statistic AS VP Percent: 0 %
Brady Statistic AS VS Percent: 47 %
Date Time Interrogation Session: 20210121150745
Implantable Lead Implant Date: 20160301
Implantable Lead Implant Date: 20160301
Implantable Lead Location: 753859
Implantable Lead Location: 753860
Implantable Lead Model: 5076
Implantable Lead Model: 5076
Implantable Pulse Generator Implant Date: 20160301
Lead Channel Impedance Value: 442 Ohm
Lead Channel Impedance Value: 655 Ohm
Lead Channel Pacing Threshold Amplitude: 0.625 V
Lead Channel Pacing Threshold Amplitude: 0.625 V
Lead Channel Pacing Threshold Pulse Width: 0.4 ms
Lead Channel Pacing Threshold Pulse Width: 0.4 ms
Lead Channel Setting Pacing Amplitude: 2 V
Lead Channel Setting Pacing Amplitude: 2.5 V
Lead Channel Setting Pacing Pulse Width: 0.4 ms
Lead Channel Setting Sensing Sensitivity: 5.6 mV

## 2019-05-02 DIAGNOSIS — I480 Paroxysmal atrial fibrillation: Secondary | ICD-10-CM | POA: Diagnosis not present

## 2019-05-02 DIAGNOSIS — N183 Chronic kidney disease, stage 3 unspecified: Secondary | ICD-10-CM | POA: Diagnosis not present

## 2019-05-02 DIAGNOSIS — R5381 Other malaise: Secondary | ICD-10-CM | POA: Diagnosis not present

## 2019-05-02 DIAGNOSIS — E039 Hypothyroidism, unspecified: Secondary | ICD-10-CM | POA: Diagnosis not present

## 2019-05-02 DIAGNOSIS — I10 Essential (primary) hypertension: Secondary | ICD-10-CM | POA: Diagnosis not present

## 2019-05-02 DIAGNOSIS — E782 Mixed hyperlipidemia: Secondary | ICD-10-CM | POA: Diagnosis not present

## 2019-05-02 DIAGNOSIS — H903 Sensorineural hearing loss, bilateral: Secondary | ICD-10-CM | POA: Diagnosis not present

## 2019-05-02 DIAGNOSIS — E559 Vitamin D deficiency, unspecified: Secondary | ICD-10-CM | POA: Diagnosis not present

## 2019-05-02 DIAGNOSIS — J209 Acute bronchitis, unspecified: Secondary | ICD-10-CM | POA: Diagnosis not present

## 2019-05-02 DIAGNOSIS — J440 Chronic obstructive pulmonary disease with acute lower respiratory infection: Secondary | ICD-10-CM | POA: Diagnosis not present

## 2019-05-02 DIAGNOSIS — R5383 Other fatigue: Secondary | ICD-10-CM | POA: Diagnosis not present

## 2019-05-02 DIAGNOSIS — F320 Major depressive disorder, single episode, mild: Secondary | ICD-10-CM | POA: Diagnosis not present

## 2019-05-02 DIAGNOSIS — Z950 Presence of cardiac pacemaker: Secondary | ICD-10-CM | POA: Diagnosis not present

## 2019-05-02 DIAGNOSIS — J449 Chronic obstructive pulmonary disease, unspecified: Secondary | ICD-10-CM | POA: Diagnosis not present

## 2019-05-02 DIAGNOSIS — E1122 Type 2 diabetes mellitus with diabetic chronic kidney disease: Secondary | ICD-10-CM | POA: Diagnosis not present

## 2019-05-02 DIAGNOSIS — I129 Hypertensive chronic kidney disease with stage 1 through stage 4 chronic kidney disease, or unspecified chronic kidney disease: Secondary | ICD-10-CM | POA: Diagnosis not present

## 2019-05-05 ENCOUNTER — Other Ambulatory Visit: Payer: Self-pay | Admitting: Family Medicine

## 2019-05-05 DIAGNOSIS — R252 Cramp and spasm: Secondary | ICD-10-CM

## 2019-05-09 ENCOUNTER — Other Ambulatory Visit: Payer: Self-pay

## 2019-05-09 ENCOUNTER — Other Ambulatory Visit (INDEPENDENT_AMBULATORY_CARE_PROVIDER_SITE_OTHER): Payer: PPO

## 2019-05-09 ENCOUNTER — Ambulatory Visit (INDEPENDENT_AMBULATORY_CARE_PROVIDER_SITE_OTHER): Payer: PPO | Admitting: Pharmacist

## 2019-05-09 DIAGNOSIS — I4891 Unspecified atrial fibrillation: Secondary | ICD-10-CM | POA: Diagnosis not present

## 2019-05-09 DIAGNOSIS — I480 Paroxysmal atrial fibrillation: Secondary | ICD-10-CM

## 2019-05-09 DIAGNOSIS — Z0000 Encounter for general adult medical examination without abnormal findings: Secondary | ICD-10-CM | POA: Diagnosis not present

## 2019-05-09 DIAGNOSIS — Z954 Presence of other heart-valve replacement: Secondary | ICD-10-CM | POA: Diagnosis not present

## 2019-05-09 DIAGNOSIS — I050 Rheumatic mitral stenosis: Secondary | ICD-10-CM | POA: Diagnosis not present

## 2019-05-09 DIAGNOSIS — I5033 Acute on chronic diastolic (congestive) heart failure: Secondary | ICD-10-CM | POA: Diagnosis not present

## 2019-05-09 LAB — POCT INR: INR: 3.2 — AB (ref 2.0–3.0)

## 2019-05-09 NOTE — Patient Instructions (Signed)
Continue warfarin 1 tablet daily except 1.5 tablets each Sundays, Tuesdays and Thursdays.  Continue greens/salads Repeat INR in 6 weeks

## 2019-05-16 ENCOUNTER — Ambulatory Visit (HOSPITAL_COMMUNITY)
Admission: RE | Admit: 2019-05-16 | Discharge: 2019-05-16 | Disposition: A | Discharge status: Home / Self Care | Payer: PPO | Source: Ambulatory Visit | Attending: Nurse Practitioner | Admitting: Nurse Practitioner

## 2019-05-16 ENCOUNTER — Encounter (HOSPITAL_COMMUNITY): Payer: Self-pay | Admitting: Nurse Practitioner

## 2019-05-16 ENCOUNTER — Other Ambulatory Visit: Payer: Self-pay

## 2019-05-16 VITALS — BP 132/78 | HR 83 | Ht 68.0 in | Wt 210.4 lb

## 2019-05-16 DIAGNOSIS — Z7984 Long term (current) use of oral hypoglycemic drugs: Secondary | ICD-10-CM | POA: Diagnosis not present

## 2019-05-16 DIAGNOSIS — Z905 Acquired absence of kidney: Secondary | ICD-10-CM | POA: Diagnosis not present

## 2019-05-16 DIAGNOSIS — Z888 Allergy status to other drugs, medicaments and biological substances status: Secondary | ICD-10-CM | POA: Insufficient documentation

## 2019-05-16 DIAGNOSIS — E039 Hypothyroidism, unspecified: Secondary | ICD-10-CM | POA: Insufficient documentation

## 2019-05-16 DIAGNOSIS — I4891 Unspecified atrial fibrillation: Secondary | ICD-10-CM | POA: Diagnosis not present

## 2019-05-16 DIAGNOSIS — Z7982 Long term (current) use of aspirin: Secondary | ICD-10-CM | POA: Diagnosis not present

## 2019-05-16 DIAGNOSIS — F17290 Nicotine dependence, other tobacco product, uncomplicated: Secondary | ICD-10-CM | POA: Insufficient documentation

## 2019-05-16 DIAGNOSIS — I4510 Unspecified right bundle-branch block: Secondary | ICD-10-CM | POA: Diagnosis not present

## 2019-05-16 DIAGNOSIS — Z79899 Other long term (current) drug therapy: Secondary | ICD-10-CM | POA: Insufficient documentation

## 2019-05-16 DIAGNOSIS — D6869 Other thrombophilia: Secondary | ICD-10-CM

## 2019-05-16 DIAGNOSIS — E669 Obesity, unspecified: Secondary | ICD-10-CM | POA: Diagnosis not present

## 2019-05-16 DIAGNOSIS — Z7901 Long term (current) use of anticoagulants: Secondary | ICD-10-CM | POA: Diagnosis not present

## 2019-05-16 DIAGNOSIS — Z8249 Family history of ischemic heart disease and other diseases of the circulatory system: Secondary | ICD-10-CM | POA: Insufficient documentation

## 2019-05-16 DIAGNOSIS — F17210 Nicotine dependence, cigarettes, uncomplicated: Secondary | ICD-10-CM | POA: Insufficient documentation

## 2019-05-16 DIAGNOSIS — Z91041 Radiographic dye allergy status: Secondary | ICD-10-CM | POA: Insufficient documentation

## 2019-05-16 DIAGNOSIS — Z6832 Body mass index (BMI) 32.0-32.9, adult: Secondary | ICD-10-CM | POA: Diagnosis not present

## 2019-05-16 DIAGNOSIS — I480 Paroxysmal atrial fibrillation: Secondary | ICD-10-CM

## 2019-05-16 DIAGNOSIS — Z823 Family history of stroke: Secondary | ICD-10-CM | POA: Insufficient documentation

## 2019-05-16 DIAGNOSIS — Z85528 Personal history of other malignant neoplasm of kidney: Secondary | ICD-10-CM | POA: Insufficient documentation

## 2019-05-16 DIAGNOSIS — Z79890 Hormone replacement therapy (postmenopausal): Secondary | ICD-10-CM | POA: Insufficient documentation

## 2019-05-16 DIAGNOSIS — G4733 Obstructive sleep apnea (adult) (pediatric): Secondary | ICD-10-CM | POA: Diagnosis not present

## 2019-05-16 DIAGNOSIS — E785 Hyperlipidemia, unspecified: Secondary | ICD-10-CM | POA: Diagnosis not present

## 2019-05-16 DIAGNOSIS — N182 Chronic kidney disease, stage 2 (mild): Secondary | ICD-10-CM | POA: Diagnosis not present

## 2019-05-16 DIAGNOSIS — Z833 Family history of diabetes mellitus: Secondary | ICD-10-CM | POA: Diagnosis not present

## 2019-05-16 DIAGNOSIS — I129 Hypertensive chronic kidney disease with stage 1 through stage 4 chronic kidney disease, or unspecified chronic kidney disease: Secondary | ICD-10-CM | POA: Insufficient documentation

## 2019-05-16 DIAGNOSIS — Z950 Presence of cardiac pacemaker: Secondary | ICD-10-CM | POA: Insufficient documentation

## 2019-05-16 NOTE — Progress Notes (Addendum)
Primary Care Physician: Raina Mina., MD Referring Physician: Dr. 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  He is in the afib clinic for long term surveillance of afib s/p maze procedure. He received a PPM at time of surgery. Interrogations  of his device have not shown any afib. He has had depression that has required treatment since his surgery. He feels well today. He has not noted any irregular heart beat. He feels much improved with surgery and his fatigue and shortness of breath is much improved. He continues on warfarin for metal valve. His wife states that his BP was up slightly with Dr. Francesca Oman visit a few weeks ago but stable today. Dr. [name] has encouraged for him to try to be active but he struggles with depression and lack of motivation.  F/u in afib clinic 05/16/19, for long term surveillance of Maze procedure performed  in 2016. Marland Kitchen He voices no complaints, denies shortness of breath. EKG shows atrial paced rhythm at 83 bpm. Continues  on coumadin for a CHA2DS2VASc score of 4.  Today, he denies symptoms of palpitations, chest pain, shortness of breath, orthopnea, PND, lower extremity edema, dizziness, presyncope, syncope, or neurologic sequela. The patient is tolerating medications without difficulties and is otherwise without complaint today.   Past Medical History:  Diagnosis Date  . Anxiety   . Borderline diabetes   . CAD (coronary artery disease)    a. By cath 08/2013: 70% distal cx-prox LPDA; tandem mod LAD lesions, o/w mild dz.  . CKD (chronic kidney disease), stage II    his creat. runs 2-2.5; hasnt seen neph yet - sees dr. Tresa Moore at [facility]  . Complete heart block (HCC)    a. s/p PPM   . Deafness in  left ear   . Depression   . Diabetes mellitus without complication ([place])   . Diverticulitis of colon 15 years ago  . Hernia   . HTN (hypertension)   . Hyperlipidemia   . Hypogonadism male   . Hypothyroidism   . Lung nodule seen on imaging study 04/10/2014   5 mm nodule right lower lobe  . Mitral stenosis    a. Dx 07/2013 - through workup of 2D echo, TEE, and cath, felt to be moderate.  . Obesity   . OSA on CPAP   . Persistent atrial fibrillation ([place])    a. sustained 160-180 on e-CARDIO monitor placed 07/29/2013. Placed on IV amiodarone at end of May 2015 due to continued paroxysms with RVR.  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   . Renal mass    a. Dx 08/2013: concerning for renal cancer.  . Rheumatic heart disease   . S/P Minimally invasive maze operation for atrial fibrillation 05/15/2014   Left side lesion set using cryothermy with clipping of LA appendage  . S/P minimally invasive mitral valve replacement with metallic valve and maze procedure 05/15/2014   46m Sorin [employer] Optiform mechanical valve placed via right mini thoracotomy approach  . Stroke (  [place]) 1998   "MRI showed 3 mild strokes"   Past Surgical History:  Procedure Laterality Date  . EYE SURGERY Right 1990   "reconstructive, lens implant- from paintball accident"  . INGUINAL HERNIA REPAIR Bilateral first one in 80's   "I had one side done twice"  . LEFT AND RIGHT HEART CATHETERIZATION WITH CORONARY ANGIOGRAM N/A 08/28/2013   Procedure: LEFT AND RIGHT HEART CATHETERIZATION WITH CORONARY ANGIOGRAM;  Surgeon: Leonie Man, MD;  Location: [facility] CATH LAB;  Service: Cardiovascular;  Laterality: N/A;  . MINIMALLY INVASIVE MAZE PROCEDURE N/A 05/15/2014   Procedure: MINIMALLY INVASIVE MAZE  PROCEDURE;  Surgeon: Rexene Alberts, MD;  Location: [place];  Service: Open Heart Surgery;  Laterality: N/A;  . MITRAL VALVE REPLACEMENT Right 05/15/2014   Procedure: MINIMALLY INVASIVE MITRAL VALVE (MV) REPLACEMENT;  Surgeon: Rexene Alberts, MD;  Location: [place];  Service: Open Heart Surgery;  Laterality: Right;  . PERMANENT PACEMAKER INSERTION Left 05/22/2014   a. MDT dual chamber PPM implanted by Dr Rayann Heman for CHB s/p MAZE/MVR  . ROBOT ASSISTED LAPAROSCOPIC NEPHRECTOMY Right 09/05/2013   Procedure: ROBOTIC ASSISTED LAPAROSCOPIC RIGHT RADICAL NEPHRECTOMY;  Surgeon: Sharyn Creamer, MD;  Location: [facility];  Service: Urology;  Laterality: Right;  . TEE WITHOUT CARDIOVERSION N/A 08/16/2013   Procedure: TRANSESOPHAGEAL ECHOCARDIOGRAM (TEE);  Surgeon: Dorothy Spark, MD;  Location: [place];  Service: Cardiovascular;  Laterality: N/A;  . TEE WITHOUT CARDIOVERSION N/A 05/15/2014   Procedure: TRANSESOPHAGEAL ECHOCARDIOGRAM (TEE);  Surgeon: Rexene Alberts, MD;  Location: [place];  Service: Open Heart Surgery;  Laterality: N/A;    Current Outpatient Medications  Medication Sig Dispense Refill  . acetaminophen (TYLENOL) 325 MG tablet Take 325 mg by mouth every 6 (six) hours as needed (pain).     Marland Kitchen amLODipine (NORVASC) 10 MG tablet TAKE 1 TABLET (10 MG TOTAL) BY MOUTH DAILY. 90 tablet 2  . aspirin EC 81 MG tablet Take 1 tablet (81 mg total) by mouth daily.    . Blood Glucose Monitoring Suppl (ONE TOUCH ULTRA MINI) w/Device KIT Use meter to check blood sugars 1-4 times daily as instructed. 1 each 0  . gabapentin (NEURONTIN) 400 MG capsule TAKE 1 CAPSULE (400 MG TOTAL) BY MOUTH AT BEDTIME. 90 capsule 1  . glucose blood (ONE TOUCH ULTRA TEST) test strip Use one strip per test. Test blood sugars 1-4 times daily as instructed. 100 each 12  . Lancets (ONETOUCH DELICA PLUS [id number]) MISC USE 1 LANCET PER TEST. TEST BLOOD SUGARS 1 4 TIMES PER DAY AS INSTRUCTED.    Marland Kitchen levothyroxine (SYNTHROID) 75 MCG tablet  TAKE 1 TABLET (75 MCG TOTAL) BY MOUTH DAILY BEFORE BREAKFAST. 90 tablet 2  . loratadine (CLARITIN) 10 MG tablet Take 1 tablet (10 mg total) by mouth daily.    Marland Kitchen MELATONIN GUMMIES PO Take by mouth. Pt states taking 2 tablets 20 mg at night but making him more sleepy so taking 1 at night    . metFORMIN (GLUCOPHAGE) 500 MG tablet TAKE 1 TABLET (500 MG TOTAL) BY MOUTH 2 (TWO) TIMES DAILY WITH A MEAL. 180 tablet 1  . metoprolol tartrate (LOPRESSOR) 25 MG tablet Take 1 tablet (25 mg total) by mouth 2 (two) times daily. 180 tablet 3  . omega-3 acid ethyl esters (LOVAZA) 1 g capsule TAKE 2 CAPSULES (2 G TOTAL) BY MOUTH 2 (TWO) TIMES DAILY. 360 capsule 2  . OneTouch Delica Lancets 81K MISC USE TO TEST BLOOD SUGARS 1-4 TIMES PER DAY AS INSTRUCTED  100 each 1  . warfarin (COUMADIN) 5 MG tablet TAKE AS DIRECTED BY ANTICOAGULATION CLINIC 120 tablet 1   No current facility-administered medications for this encounter.    Allergies  Allergen Reactions  . Contrast Media [Iodinated Diagnostic Agents] Hives and Other (See Comments)    Patient started sneezing and coughing, patient broke out in hives, patient needs 13 hour prep if having IV contrast  . Ioxaglate Hives and Rash    Patient started sneezing and coughing, patient broke out in hives, patient needs 13 hour prep if having IV contrast  . Atorvastatin Other (See Comments)    Myalgias  . Crestor [Rosuvastatin] Other (See Comments)    Myalgias  . Fenofibrate Other (See Comments)    MYALGIAS   . Pravastatin Other (See Comments)    Myalgias   . Pseudoephedrine Hcl Palpitations    Social History   Socioeconomic History  . Marital status: Married    Spouse name: Not on file  . Number of children: 2  . Years of education: 65  . Highest education level: Not on file  Occupational History  . Occupation: Retired     Comment: Warehouse manager  Tobacco Use  . Smoking status: Current Every Day Smoker    Packs/day: 0.75    Years: 50.00    Pack years:  37.50    Types: Cigarettes, Cigars  . Smokeless tobacco: Never Used  . Tobacco comment: Cigar every now and then  Substance and Sexual Activity  . Alcohol use: No    Alcohol/week: 0.0 standard drinks  . Drug use: No  . Sexual activity: Not Currently  Other Topics Concern  . Not on file  Social History Narrative   Fun: [employer]ravate people.    Right hand    One story home   Social Determinants of Health   Financial Resource Strain:   . Difficulty of Paying Living Expenses: Not on file  Food Insecurity:   . Worried About Charity fundraiser in the Last Year: Not on file  . Ran Out of Food in the Last Year: Not on file  Transportation Needs:   . Lack of Transportation (Medical): Not on file  . Lack of Transportation (Non-Medical): Not on file  Physical Activity:   . Days of Exercise per Week: Not on file  . Minutes of Exercise per Session: Not on file  Stress:   . Feeling of Stress : Not on file  Social Connections:   . Frequency of Communication with Friends and Family: Not on file  . Frequency of Social Gatherings with Friends and Family: Not on file  . Attends Religious Services: Not on file  . Active Member of Clubs or Organizations: Not on file  . Attends Archivist Meetings: Not on file  . Marital Status: Not on file  Intimate Partner Violence:   . Fear of Current or Ex-Partner: Not on file  . Emotionally Abused: Not on file  . Physically Abused: Not on file  . Sexually Abused: Not on file    Family History  Problem Relation Age of Onset  . Hypertension Mother   . Heart attack Mother   . Stroke Mother   . Heart disease Father   . Hypertension Other   . Heart disease Brother   . Diabetes Brother   . Healthy Son   . Healthy Daughter     ROS- All systems are reviewed and negative except as per the HPI above  Physical Exam: Vitals:  05/16/19 1151  BP: 132/78  Pulse: 83  Weight: 95.4 kg  Height: 5' 8"  (1.727 m)   Wt Readings from Last 3  Encounters:  05/16/19 95.4 kg  01/23/19 98.4 kg  01/17/19 96.2 kg    Labs: Lab Results  Component Value Date   NA 136 11/11/2018   K 4.4 11/11/2018   CL 105 11/11/2018   CO2 22 11/11/2018   GLUCOSE 215 (H) 11/11/2018   BUN 22 11/11/2018   CREATININE 1.58 (H) 11/11/2018   CALCIUM 9.7 11/11/2018   MG 2.3 04/08/2018   Lab Results  Component Value Date   INR 3.2 (A) 05/09/2019   Lab Results  Component Value Date   CHOL 160 04/22/2017   HDL 22 (L) 04/22/2017   LDLCALC 77 04/22/2017   TRIG 307 (H) 04/22/2017     GEN- The patient is well appearing, alert and oriented x 3 today.   Head- normocephalic, atraumatic Eyes-  Sclera clear, conjunctiva pink Ears- hearing intact Oropharynx- clear Neck- supple, no JVP Lymph- no cervical lymphadenopathy Lungs- Clear to ausculation bilaterally, normal work of breathing Heart- Regular rate and rhythm, no murmurs, rubs or gallops, PMI not laterally displaced GI- soft, NT, ND, + BS Extremities- no clubbing, cyanosis, or edema MS- no significant deformity or atrophy Skin- no rash or lesion Psych- euthymic mood, full affect Neuro- strength and sensation are intact  EKG-atrial paced rhythm with prolonged AV conduction at 83 bpm. Epic records reviewed Remote and in office interrogations reviewed   Assessment and Plan: 1. Afib, s/p MVR and Maze procedure, clipping of LAA in 2016 No evidence of recurrent afib Pt feels  improved with physical symptoms of shortness of breath and fatigue since SR has been restored Continue coumadin Continue metoprolol at  25 mg bid  2. PPM Per Dr. Rayann Heman  3. CHA2DS2VASc score of 4 Continue  coumadin   F/u in one year for long term surveillance for presence of afib s/p Maze procedure    Butch Penny C. Latia Mataya, [facility] [address] [place], [place] [address] [phone]

## 2019-05-18 DIAGNOSIS — N1831 Chronic kidney disease, stage 3a: Secondary | ICD-10-CM | POA: Diagnosis not present

## 2019-06-10 ENCOUNTER — Other Ambulatory Visit: Payer: Self-pay | Admitting: Family Medicine

## 2019-06-10 DIAGNOSIS — E1122 Type 2 diabetes mellitus with diabetic chronic kidney disease: Secondary | ICD-10-CM

## 2019-06-12 ENCOUNTER — Other Ambulatory Visit: Payer: Self-pay | Admitting: Physician Assistant

## 2019-06-14 DIAGNOSIS — C649 Malignant neoplasm of unspecified kidney, except renal pelvis: Secondary | ICD-10-CM | POA: Diagnosis not present

## 2019-06-14 DIAGNOSIS — E1122 Type 2 diabetes mellitus with diabetic chronic kidney disease: Secondary | ICD-10-CM | POA: Diagnosis not present

## 2019-06-14 DIAGNOSIS — N179 Acute kidney failure, unspecified: Secondary | ICD-10-CM | POA: Diagnosis not present

## 2019-06-14 DIAGNOSIS — I129 Hypertensive chronic kidney disease with stage 1 through stage 4 chronic kidney disease, or unspecified chronic kidney disease: Secondary | ICD-10-CM | POA: Diagnosis not present

## 2019-06-14 DIAGNOSIS — I4891 Unspecified atrial fibrillation: Secondary | ICD-10-CM | POA: Diagnosis not present

## 2019-06-14 DIAGNOSIS — I509 Heart failure, unspecified: Secondary | ICD-10-CM | POA: Diagnosis not present

## 2019-06-14 DIAGNOSIS — Z952 Presence of prosthetic heart valve: Secondary | ICD-10-CM | POA: Diagnosis not present

## 2019-06-14 DIAGNOSIS — D751 Secondary polycythemia: Secondary | ICD-10-CM | POA: Diagnosis not present

## 2019-06-14 DIAGNOSIS — F329 Major depressive disorder, single episode, unspecified: Secondary | ICD-10-CM | POA: Diagnosis not present

## 2019-06-14 DIAGNOSIS — Z8679 Personal history of other diseases of the circulatory system: Secondary | ICD-10-CM | POA: Diagnosis not present

## 2019-06-14 DIAGNOSIS — N1831 Chronic kidney disease, stage 3a: Secondary | ICD-10-CM | POA: Diagnosis not present

## 2019-06-20 ENCOUNTER — Telehealth: Payer: Self-pay

## 2019-06-20 NOTE — Telephone Encounter (Signed)
lmomed for overdue inr 

## 2019-06-27 ENCOUNTER — Other Ambulatory Visit (INDEPENDENT_AMBULATORY_CARE_PROVIDER_SITE_OTHER): Payer: PPO

## 2019-06-27 ENCOUNTER — Other Ambulatory Visit: Payer: Self-pay

## 2019-06-27 ENCOUNTER — Ambulatory Visit (INDEPENDENT_AMBULATORY_CARE_PROVIDER_SITE_OTHER): Payer: PPO | Admitting: *Deleted

## 2019-06-27 DIAGNOSIS — I5033 Acute on chronic diastolic (congestive) heart failure: Secondary | ICD-10-CM

## 2019-06-27 DIAGNOSIS — Z954 Presence of other heart-valve replacement: Secondary | ICD-10-CM

## 2019-06-27 DIAGNOSIS — Z0000 Encounter for general adult medical examination without abnormal findings: Secondary | ICD-10-CM | POA: Diagnosis not present

## 2019-06-27 DIAGNOSIS — I480 Paroxysmal atrial fibrillation: Secondary | ICD-10-CM | POA: Diagnosis not present

## 2019-06-27 DIAGNOSIS — I4891 Unspecified atrial fibrillation: Secondary | ICD-10-CM | POA: Diagnosis not present

## 2019-06-27 LAB — POCT INR: INR: 2.5 (ref 2.0–3.0)

## 2019-06-27 NOTE — Patient Instructions (Signed)
Continue warfarin 1 tablet daily except 1.5 tablets each Sundays, Tuesdays and Thursdays.  Continue greens/salads Repeat INR in 6 weeks

## 2019-06-29 ENCOUNTER — Other Ambulatory Visit: Payer: Self-pay | Admitting: Cardiology

## 2019-07-05 ENCOUNTER — Other Ambulatory Visit: Payer: Self-pay

## 2019-07-05 ENCOUNTER — Ambulatory Visit (HOSPITAL_COMMUNITY)
Admission: RE | Admit: 2019-07-05 | Discharge: 2019-07-05 | Disposition: A | Discharge status: Home / Self Care | Payer: PPO | Source: Ambulatory Visit | Attending: Cardiovascular Disease | Admitting: Cardiovascular Disease

## 2019-07-05 ENCOUNTER — Other Ambulatory Visit (HOSPITAL_COMMUNITY): Payer: Self-pay | Admitting: Cardiology

## 2019-07-05 DIAGNOSIS — N183 Chronic kidney disease, stage 3 unspecified: Secondary | ICD-10-CM | POA: Insufficient documentation

## 2019-07-05 DIAGNOSIS — F17200 Nicotine dependence, unspecified, uncomplicated: Secondary | ICD-10-CM | POA: Diagnosis not present

## 2019-07-05 DIAGNOSIS — I6523 Occlusion and stenosis of bilateral carotid arteries: Secondary | ICD-10-CM | POA: Insufficient documentation

## 2019-07-05 DIAGNOSIS — I63231 Cerebral infarction due to unspecified occlusion or stenosis of right carotid arteries: Secondary | ICD-10-CM

## 2019-07-05 DIAGNOSIS — R42 Dizziness and giddiness: Secondary | ICD-10-CM | POA: Diagnosis not present

## 2019-07-05 DIAGNOSIS — I2510 Atherosclerotic heart disease of native coronary artery without angina pectoris: Secondary | ICD-10-CM | POA: Diagnosis not present

## 2019-07-05 DIAGNOSIS — W19XXXA Unspecified fall, initial encounter: Secondary | ICD-10-CM | POA: Diagnosis not present

## 2019-07-05 DIAGNOSIS — E785 Hyperlipidemia, unspecified: Secondary | ICD-10-CM | POA: Insufficient documentation

## 2019-07-05 DIAGNOSIS — I129 Hypertensive chronic kidney disease with stage 1 through stage 4 chronic kidney disease, or unspecified chronic kidney disease: Secondary | ICD-10-CM | POA: Diagnosis not present

## 2019-07-07 ENCOUNTER — Telehealth: Payer: Self-pay | Admitting: Cardiology

## 2019-07-07 DIAGNOSIS — I779 Disorder of arteries and arterioles, unspecified: Secondary | ICD-10-CM

## 2019-07-07 NOTE — Telephone Encounter (Signed)
I spoke with patient's wife and reviewed carotid doppler results with her. Order placed for one year follow up

## 2019-07-07 NOTE — Telephone Encounter (Signed)
Patient's wife returning call for test results.

## 2019-07-12 ENCOUNTER — Ambulatory Visit: Payer: Self-pay | Admitting: *Deleted

## 2019-07-13 ENCOUNTER — Ambulatory Visit (INDEPENDENT_AMBULATORY_CARE_PROVIDER_SITE_OTHER): Payer: PPO | Admitting: *Deleted

## 2019-07-13 DIAGNOSIS — I442 Atrioventricular block, complete: Secondary | ICD-10-CM | POA: Diagnosis not present

## 2019-07-13 LAB — CUP PACEART REMOTE DEVICE CHECK
Battery Impedance: 255 Ohm
Battery Remaining Longevity: 118 mo
Battery Voltage: 2.78 V
Brady Statistic AP VP Percent: 0 %
Brady Statistic AP VS Percent: 50 %
Brady Statistic AS VP Percent: 0 %
Brady Statistic AS VS Percent: 49 %
Date Time Interrogation Session: 20210422084943
Implantable Lead Implant Date: 20160301
Implantable Lead Implant Date: 20160301
Implantable Lead Location: 753859
Implantable Lead Location: 753860
Implantable Lead Model: 5076
Implantable Lead Model: 5076
Implantable Pulse Generator Implant Date: 20160301
Lead Channel Impedance Value: 455 Ohm
Lead Channel Impedance Value: 665 Ohm
Lead Channel Pacing Threshold Amplitude: 0.625 V
Lead Channel Pacing Threshold Amplitude: 0.625 V
Lead Channel Pacing Threshold Pulse Width: 0.4 ms
Lead Channel Pacing Threshold Pulse Width: 0.4 ms
Lead Channel Setting Pacing Amplitude: 2 V
Lead Channel Setting Pacing Amplitude: 2.5 V
Lead Channel Setting Pacing Pulse Width: 0.4 ms
Lead Channel Setting Sensing Sensitivity: 5.6 mV

## 2019-07-14 NOTE — Progress Notes (Signed)
PPM Remote  

## 2019-07-18 ENCOUNTER — Other Ambulatory Visit: Payer: Self-pay

## 2019-07-18 ENCOUNTER — Ambulatory Visit: Payer: PPO | Admitting: Cardiology

## 2019-07-18 ENCOUNTER — Encounter: Payer: Self-pay | Admitting: Cardiology

## 2019-07-18 VITALS — BP 124/76 | HR 72 | Ht 68.0 in | Wt 207.0 lb

## 2019-07-18 DIAGNOSIS — I10 Essential (primary) hypertension: Secondary | ICD-10-CM | POA: Diagnosis not present

## 2019-07-18 DIAGNOSIS — I480 Paroxysmal atrial fibrillation: Secondary | ICD-10-CM | POA: Diagnosis not present

## 2019-07-18 DIAGNOSIS — Z954 Presence of other heart-valve replacement: Secondary | ICD-10-CM | POA: Diagnosis not present

## 2019-07-18 DIAGNOSIS — I2510 Atherosclerotic heart disease of native coronary artery without angina pectoris: Secondary | ICD-10-CM

## 2019-07-18 NOTE — Progress Notes (Signed)
Cardiology Office Note:    Date:  07/18/2019   ID:  Kevin English, DOB 06/26/1955, MRN [id number]  PCP:  Raina Mina., MD  Cardiologist:  Dr. Ena Dawley   Electrophysiologist:  Dr. Thompson Grayer  Nephrologist: Dr. Marval Regal  Chief complain: 6 months follow-up  History of Present Illness:     Kevin English is a 64 y.o. male with a hx of HTN, HL (intol to statins), former smoker, PAFib, Rheumatic heart disease with severe mitral stenosis, diastolic HF, depression, CKD, renal cell CA, OSA. He has been on Coumadin. He had been prepared for minimally invasive MVR and placed on Amiodarone. However, he developed clear cell renal cell CA and underwent R nephrectomy. He developed fatigue with beta-blocker and Bystolic was DC'd. He was ultimately stabilized and underwent Minimally-Invasive Mitral Valve Replacement (Sorin Carbomedics Optiform bileaflet mechanical valve) and Maze Procedure with Left atrial lesion set using cryothermy and Clipping of Left Atrial Appendage with Dr. Roxy Manns in 2/16. Post op course was complicated by complete heart block. He ultimately required implantation of a Medtronic Adapta L dual-chamber pacemaker.   07/18/2019 -the patient is coming after 6 months, he has been doing well from cardiac standpoint, he continues to have mild balance issues however no falls.  He continues to take all of his medications, his bleeding.  No palpitations.  He was seen in the A. fib clinic and has no recurrent episodes of atrial fibrillation.  He has been on and off on different antidepressants, he discontinued for a while as they made his balance problems worse.  He is currently being restarted on sertraline.  Past Medical History:  Diagnosis Date  . Anxiety   . Borderline diabetes   . CAD (coronary artery disease)    a. By cath 08/2013: 70% distal cx-prox LPDA; tandem mod LAD lesions, o/w mild dz.  . CKD (chronic kidney disease), stage II    his creat. runs 2-2.5; hasnt seen  neph yet - sees dr. Tresa [facility]  . Complete heart block (HCC)    a. s/p PPM   . Deafness in left ear   . Depression   . Diabetes mellitus without complication ([place])   . Diverticulitis of colon 15 years ago  . Hernia   . HTN (hypertension)   . Hyperlipidemia   . Hypogonadism male   . Hypothyroidism   . Lung nodule seen on imaging study 04/10/2014   5 mm nodule right lower lobe  . Mitral stenosis    a. Dx 07/2013 - through workup of 2D echo, TEE, and cath, felt to be moderate.  . Obesity   . OSA on CPAP   . Persistent atrial fibrillation ([place])    a. sustained 160-180 on e-CARDIO monitor placed 07/29/2013. Placed on IV amiodarone at end of May 2015 due to continued paroxysms with RVR.  Marland Kitchen Renal cell carcinoma of right kidney ([place]) 09/05/2013   clear cell renal cell carcinoma of right kidney treated by radical nephrectomy, Fuhrman grade 2-3, with maximum tumor diameter 2.7 cm, all tumor to find confined to the kidney, and all surgical margins negative (T1aN0).   . Renal mass    a. Dx 08/2013: concerning for renal cancer.  . Rheumatic heart disease   . S/P Minimally invasive maze operation for atrial fibrillation 05/15/2014   Left side lesion set using cryothermy with clipping of LA appendage  . S/P minimally invasive mitral valve replacement with metallic valve and maze procedure 05/15/2014   70m Sorin Carbomedics  Optiform mechanical valve placed via right mini thoracotomy approach  . Stroke [facility]) 1998   "MRI showed 3 mild strokes"   Past Surgical History:  Procedure Laterality Date  . EYE SURGERY Right 1990   "reconstructive, lens implant- from paintball accident"  . INGUINAL HERNIA REPAIR Bilateral first one in 80's   "I had one side done twice"  . LEFT AND RIGHT HEART CATHETERIZATION WITH CORONARY ANGIOGRAM N/A 08/28/2013   Procedure: LEFT AND RIGHT HEART CATHETERIZATION WITH CORONARY ANGIOGRAM;  Surgeon: Leonie Man, MD;  Location: [facility] CATH LAB;  Service: Cardiovascular;   Laterality: N/A;  . MINIMALLY INVASIVE MAZE PROCEDURE N/A 05/15/2014   Procedure: MINIMALLY INVASIVE MAZE PROCEDURE;  Surgeon: Rexene Alberts, MD;  Location: [place];  Service: Open Heart Surgery;  Laterality: N/A;  . MITRAL VALVE REPLACEMENT Right 05/15/2014   Procedure: MINIMALLY INVASIVE MITRAL VALVE (MV) REPLACEMENT;  Surgeon: Rexene Alberts, MD;  Location: [place];  Service: Open Heart Surgery;  Laterality: Right;  . PERMANENT PACEMAKER INSERTION Left 05/22/2014   a. MDT dual chamber PPM implanted by Dr Rayann Heman for CHB s/p MAZE/MVR  . ROBOT ASSISTED LAPAROSCOPIC NEPHRECTOMY Right 09/05/2013   Procedure: ROBOTIC ASSISTED LAPAROSCOPIC RIGHT RADICAL NEPHRECTOMY;  Surgeon: Sharyn Creamer, MD;  Location: [facility];  Service: Urology;  Laterality: Right;  . TEE WITHOUT CARDIOVERSION N/A 08/16/2013   Procedure: TRANSESOPHAGEAL ECHOCARDIOGRAM (TEE);  Surgeon: Dorothy Spark, MD;  Location: [place];  Service: Cardiovascular;  Laterality: N/A;  . TEE WITHOUT CARDIOVERSION N/A 05/15/2014   Procedure: TRANSESOPHAGEAL ECHOCARDIOGRAM (TEE);  Surgeon: Rexene Alberts, MD;  Location: [place];  Service: Open Heart Surgery;  Laterality: N/A;   Current Medications: Outpatient Medications Prior to Visit  Medication Sig Dispense Refill  . acetaminophen (TYLENOL) 325 MG tablet Take 325 mg by mouth every 6 (six) hours as needed (pain).     Marland Kitchen amLODipine (NORVASC) 10 MG tablet TAKE 1 TABLET (10 MG TOTAL) BY MOUTH DAILY. 90 tablet 1  . aspirin EC 81 MG tablet Take 1 tablet (81 mg total) by mouth daily.    . Blood Glucose Monitoring Suppl (ONE TOUCH ULTRA MINI) w/Device KIT Use meter to check blood sugars 1-4 times daily as instructed. 1 each 0  . glucose blood (ONE TOUCH ULTRA TEST) test strip Use one strip per test. Test blood sugars 1-4 times daily as instructed. 100 each 12  . Lancets (ONETOUCH DELICA PLUS [id number]) MISC USE 1 LANCET PER TEST. TEST BLOOD SUGARS 1 4 TIMES PER DAY AS INSTRUCTED.    Marland Kitchen levothyroxine  (SYNTHROID) 75 MCG tablet TAKE 1 TABLET (75 MCG TOTAL) BY MOUTH DAILY BEFORE BREAKFAST. 90 tablet 2  . loratadine (CLARITIN) 10 MG tablet Take 1 tablet (10 mg total) by mouth daily.    Marland Kitchen MELATONIN GUMMIES PO Take by mouth. Pt states taking 2 tablets 20 mg at night but making him more sleepy so taking 1 at night    . metFORMIN (GLUCOPHAGE) 500 MG tablet TAKE 1 TABLET (500 MG TOTAL) BY MOUTH 2 (TWO) TIMES DAILY WITH A MEAL. 180 tablet 1  . metoprolol tartrate (LOPRESSOR) 25 MG tablet Take 1 tablet (25 mg total) by mouth 2 (two) times daily. 180 tablet 3  . omega-3 acid ethyl esters (LOVAZA) 1 g capsule TAKE 2 CAPSULES (2 G TOTAL) BY MOUTH 2 (TWO) TIMES DAILY. 360 capsule 2  . OneTouch Delica Lancets 29N MISC USE TO TEST BLOOD SUGARS 1-4 TIMES PER DAY AS INSTRUCTED 100 each 1  .  sertraline (ZOLOFT) 50 MG tablet Take 50 mg by mouth daily.    . vitamin B-12 (CYANOCOBALAMIN) 1000 MCG tablet Take 1,000 mcg by mouth daily.    Marland Kitchen warfarin (COUMADIN) 5 MG tablet Take 1 to 1.5 tablets daily as directed by Anticoagulation Clinic 120 tablet 1  . gabapentin (NEURONTIN) 400 MG capsule TAKE 1 CAPSULE (400 MG TOTAL) BY MOUTH AT BEDTIME. 90 capsule 1   No facility-administered medications prior to visit.    Allergies:   Contrast media [iodinated diagnostic agents], Ioxaglate, Atorvastatin, Crestor [rosuvastatin], Fenofibrate, Pravastatin, and Pseudoephedrine hcl   Social History   Socioeconomic History  . Marital status: Married    Spouse name: Not on file  . Number of children: 2  . Years of education: 57  . Highest education level: Not on file  Occupational History  . Occupation: Retired     Comment: Warehouse manager  Tobacco Use  . Smoking status: Current Every Day Smoker    Packs/day: 0.75    Years: 50.00    Pack years: 37.50    Types: Cigarettes, Cigars  . Smokeless tobacco: Never Used  . Tobacco comment: Cigar every now and then  Substance and Sexual Activity  . Alcohol use: No    Alcohol/week: 0.0  standard drinks  . Drug use: No  . Sexual activity: Not Currently  Other Topics Concern  . Not on file  Social History Narrative   Fun: Aggravate people.    Right hand    One story home   Social Determinants of Health   Financial Resource Strain:   . Difficulty of Paying Living Expenses:   Food Insecurity:   . Worried About Charity fundraiser in the Last Year:   . Arboriculturist in the Last Year:   Transportation Needs:   . Film/video editor (Medical):   Marland Kitchen Lack of Transportation (Non-Medical):   Physical Activity:   . Days of Exercise per Week:   . Minutes of Exercise per Session:   Stress:   . Feeling of Stress :   Social Connections:   . Frequency of Communication with Friends and Family:   . Frequency of Social Gatherings with Friends and Family:   . Attends Religious Services:   . Active Member of Clubs or Organizations:   . Attends Archivist Meetings:   Marland Kitchen Marital Status:     Family History:  The patient's family history includes Diabetes in his brother; Healthy in his daughter and son; Heart attack in his mother; Heart disease in his brother and father; Hypertension in his mother and another family member; Stroke in his mother.   ROS:   Please see the history of present illness.    ROS All other systems reviewed and are negative.  Physical Exam:    VS:  BP 124/76   Pulse 72   Ht 5' 8"  (1.727 m)   Wt 207 lb (93.9 kg)   SpO2 97%   BMI 31.47 kg/m    GEN: Well nourished, well developed, in no acute distress  HEENT: normal  Neck: no JVD, no masses Cardiac: mechanical S1, normal S2, RRR; no murmurs, rubs, or gallops, no edema;  Respiratory:  clear to auscultation bilaterally; no wheezing, rhonchi or rales GI: soft, nontender, nondistended MS: no deformity or atrophy  Skin: warm and dry Neuro: No focal deficits  Psych: Alert and oriented x 3, normal affect  Wt Readings from Last 3 Encounters:  07/18/19 207 lb (93.9 kg)  05/16/19  210 lb 6.4  oz (95.4 kg)  01/23/19 217 lb (98.4 kg)    Studies/Labs Reviewed:     EKG:  EKG is  ordered today.  The ekg ordered today demonstrates Normal sinus rhythm with right bundle branch block and left anterior fascicular block, unchanged from prior. This was personally reviewed.   Recent Labs: 11/11/2018: ALT 53; BUN 22; Creatinine, Ser 1.58; Hemoglobin 15.5; Platelets 244.0; Potassium 4.4; Sodium 136; TSH 1.27  Labs at nephrologists office 06/13/15: BUN 17, creatinine 1.50, potassium 5, ALT 25  Recent Lipid Panel    Component Value Date/Time   CHOL 160 04/22/2017 0852   TRIG 307 (H) 04/22/2017 0852   HDL 22 (L) 04/22/2017 0852   CHOLHDL 7.3 (H) 04/22/2017 0852   CHOLHDL 8.4 (H) 03/19/2015 0756   VLDL 54 (H) 03/19/2015 0756   LDLCALC 77 04/22/2017 0852   LDLDIRECT 62.0 12/13/2014 0845   Additional studies/ records that were reviewed today include:   Echo 7/16 Mild LVH, EF 50-55%, normal wall motion, trivial AI, mechanical MVR with normal function, mean gradient 4 mmHg, mild to moderate LAE, normal RV function  Echo 4/16 Mild LVH, EF 45-50%, apical dyskinesis, trivial AI, mechanical MVR okay, severe LAE  Carotid US 04/16/14 Bilateral - 1% to 39% ICA stensosis.  TEE 0/48/88 Normal systolic function, normal wall motion, mild AI, moderate LAE Impressions: Rheumatic mitral valve with moderate leaflet tip thickening and minimal calcifications. Moderate mitral stenosis and trace mitral regurgitation.  TTE: 03/2018  Left ventricle: The cavity size was normal. There was moderate   concentric hypertrophy. Systolic function was normal. The   estimated ejection fraction was in the range of 60% to 65%. Features are consistent with a pseudonormal left   ventricular filling pattern, with concomitant abnormal relaxation   and increased filling pressure (grade 2 diastolic dysfunction).   - Mitral valve: Transvalvular velocity was within the normal range.   There was no evidence for stenosis. There  was no regurgitation.  - Left atrium: The atrium was moderately dilated. - Pulmonary arteries: Systolic pressure was within the normal   range. PA peak pressure: 35 mm Hg (S).  Cardiac cath 08/28/13 LM: Normal LAD: Proximal 40-50%, distal 50-60% LCx: Small OM 70-80% (not amenable to PCI or CABG), PDA 70% LPDA: 10-20% RCA: Mid 30-40%   ASSESSMENT:     1. S/P mitral valve replacement with metallic valve   2. PAF (paroxysmal atrial fibrillation) ([place])   3. Essential hypertension   4. Coronary artery disease involving native coronary artery of native heart without angina pectoris     PLAN:     In order of problems listed above:  1. S/p Mechanical MVR - Echo in 03/2018 with normal LV function, well functioning mechanical MVR, normal transmitral gradients. Continue Coumadin.  He has no bleeding.  Continue SBE prophylaxis. No bleeding on Coumadin.   2. PAF, status post MVR and maze procedure and left atrial clipping in 2016 - Maintaining NSR. 0% atrial fibrillation on pacemaker interrogation in 2020, he was seen in A. fib clinic by Roderic Palau.  We will continue Coumadin.  He has no bleeding.  3. HL - Continue current Rx Lovaza and Zetia, statin intolerant.  4. Essential hypertension -controlled  5. CAD - Continue ASA, beta-blocker.  He is asymptomatic.  He is encouraged to start exercising daily.  6. OSA - Continue CPAP.   7.  Weakness, recurrent falls - carotid ultrasound showed only minimal plaque bilaterally.  Medication Adjustments/Labs and Tests Ordered: Current  medicines are reviewed at length with the patient today.  Concerns regarding medicines are outlined above.  Medication changes, Labs and Tests ordered today are outlined in the Patient Instructions noted below. Patient Instructions  Medication Instructions:   Your physician recommends that you continue on your current medications as directed. Please refer to the Current Medication list given to you today.  *If  you need a refill on your cardiac medications before your next appointment, please call your pharmacy*   Follow-Up: At [facility], you and your health needs are our priority.  As part of our continuing mission to provide you with exceptional heart care, we have created designated Provider Care Teams.  These Care Teams include your primary Cardiologist (physician) and Advanced Practice Providers (APPs -  Physician Assistants and Nurse Practitioners) who all work together to provide you with the care you need, when you need it.  We recommend signing up for the patient portal called "MyChart".  Sign up information is provided on this After Visit Summary.  MyChart is used to connect with patients for Virtual Visits (Telemedicine).  Patients are able to view lab/test results, encounter notes, upcoming appointments, etc.  Non-urgent messages can be sent to your provider as well.   To learn more about what you can do with MyChart, go to [email or web address].    Your next appointment:   6 month(s)  The format for your next appointment:   In Person  Provider:   Ena Dawley, MD      Signed, Ena Dawley, MD  07/18/2019 12:27 PM    [address], [place], [place]  [address] Phone: [phone]; Fax: [phone]

## 2019-07-18 NOTE — Patient Instructions (Signed)

## 2019-08-08 ENCOUNTER — Other Ambulatory Visit: Payer: Self-pay

## 2019-08-08 ENCOUNTER — Other Ambulatory Visit (INDEPENDENT_AMBULATORY_CARE_PROVIDER_SITE_OTHER): Payer: PPO

## 2019-08-08 ENCOUNTER — Ambulatory Visit (INDEPENDENT_AMBULATORY_CARE_PROVIDER_SITE_OTHER): Payer: PPO | Admitting: Pharmacist

## 2019-08-08 DIAGNOSIS — I5033 Acute on chronic diastolic (congestive) heart failure: Secondary | ICD-10-CM | POA: Diagnosis not present

## 2019-08-08 DIAGNOSIS — I480 Paroxysmal atrial fibrillation: Secondary | ICD-10-CM | POA: Diagnosis not present

## 2019-08-08 DIAGNOSIS — I050 Rheumatic mitral stenosis: Secondary | ICD-10-CM | POA: Diagnosis not present

## 2019-08-08 DIAGNOSIS — Z0000 Encounter for general adult medical examination without abnormal findings: Secondary | ICD-10-CM | POA: Diagnosis not present

## 2019-08-08 DIAGNOSIS — I4891 Unspecified atrial fibrillation: Secondary | ICD-10-CM

## 2019-08-08 DIAGNOSIS — Z954 Presence of other heart-valve replacement: Secondary | ICD-10-CM

## 2019-08-08 LAB — POCT INR: INR: 2.9 (ref 2.0–3.0)

## 2019-08-08 NOTE — Patient Instructions (Signed)
Description   Continue warfarin 1 tablet daily except 1.5 tablets each Sundays, Tuesdays and Thursdays.  Continue greens/salads Repeat INR in 6 weeks

## 2019-08-09 DIAGNOSIS — H43811 Vitreous degeneration, right eye: Secondary | ICD-10-CM | POA: Diagnosis not present

## 2019-08-09 DIAGNOSIS — H52222 Regular astigmatism, left eye: Secondary | ICD-10-CM | POA: Diagnosis not present

## 2019-08-09 DIAGNOSIS — E119 Type 2 diabetes mellitus without complications: Secondary | ICD-10-CM | POA: Diagnosis not present

## 2019-08-09 DIAGNOSIS — H43822 Vitreomacular adhesion, left eye: Secondary | ICD-10-CM | POA: Diagnosis not present

## 2019-08-09 DIAGNOSIS — H2512 Age-related nuclear cataract, left eye: Secondary | ICD-10-CM | POA: Diagnosis not present

## 2019-08-09 DIAGNOSIS — H35371 Puckering of macula, right eye: Secondary | ICD-10-CM | POA: Diagnosis not present

## 2019-08-09 DIAGNOSIS — H524 Presbyopia: Secondary | ICD-10-CM | POA: Diagnosis not present

## 2019-08-09 DIAGNOSIS — H5211 Myopia, right eye: Secondary | ICD-10-CM | POA: Diagnosis not present

## 2019-08-09 DIAGNOSIS — H52221 Regular astigmatism, right eye: Secondary | ICD-10-CM | POA: Diagnosis not present

## 2019-08-17 DIAGNOSIS — N183 Chronic kidney disease, stage 3 unspecified: Secondary | ICD-10-CM | POA: Diagnosis not present

## 2019-08-30 ENCOUNTER — Ambulatory Visit: Payer: PPO | Admitting: Cardiology

## 2019-09-22 DIAGNOSIS — H903 Sensorineural hearing loss, bilateral: Secondary | ICD-10-CM | POA: Diagnosis not present

## 2019-09-26 ENCOUNTER — Ambulatory Visit (INDEPENDENT_AMBULATORY_CARE_PROVIDER_SITE_OTHER): Payer: PPO | Admitting: Pharmacist

## 2019-09-26 ENCOUNTER — Other Ambulatory Visit: Payer: Self-pay

## 2019-09-26 DIAGNOSIS — I4891 Unspecified atrial fibrillation: Secondary | ICD-10-CM | POA: Diagnosis not present

## 2019-09-26 DIAGNOSIS — I5033 Acute on chronic diastolic (congestive) heart failure: Secondary | ICD-10-CM

## 2019-09-26 DIAGNOSIS — I480 Paroxysmal atrial fibrillation: Secondary | ICD-10-CM

## 2019-09-26 DIAGNOSIS — Z954 Presence of other heart-valve replacement: Secondary | ICD-10-CM | POA: Diagnosis not present

## 2019-09-26 DIAGNOSIS — Z0000 Encounter for general adult medical examination without abnormal findings: Secondary | ICD-10-CM | POA: Diagnosis not present

## 2019-09-26 DIAGNOSIS — Z5181 Encounter for therapeutic drug level monitoring: Secondary | ICD-10-CM

## 2019-09-26 DIAGNOSIS — I050 Rheumatic mitral stenosis: Secondary | ICD-10-CM

## 2019-09-26 LAB — POCT INR: INR: 3 (ref 2.0–3.0)

## 2019-09-26 NOTE — Patient Instructions (Signed)
Description   Continue warfarin 1 tablet daily except 1.5 tablets each Sundays, Tuesdays and Thursdays.  Continue greens/salads Repeat INR in 8 weeks

## 2019-10-06 DIAGNOSIS — H903 Sensorineural hearing loss, bilateral: Secondary | ICD-10-CM | POA: Diagnosis not present

## 2019-10-12 ENCOUNTER — Telehealth: Payer: Self-pay

## 2019-10-12 ENCOUNTER — Ambulatory Visit (INDEPENDENT_AMBULATORY_CARE_PROVIDER_SITE_OTHER): Payer: PPO | Admitting: *Deleted

## 2019-10-12 DIAGNOSIS — I442 Atrioventricular block, complete: Secondary | ICD-10-CM

## 2019-10-12 NOTE — Telephone Encounter (Signed)
Patient's wife, Santiago Glad contacted the office concerned because patient's Dentist, Dr. Elliot Cousin in [place] stated that patient did not need prophylactic antibiotic for teeth extraction patient was getting ready to undergo.  Patient is s/p MR  Replacement with Dr. Roxy Manns 05/15/2014 and does need pre-procedure antibiotic before any dental work for the rest of his life.  Explained the importance and advised that he needed to get the antibiotic prescribed by his PCP/ dentist/ Cardiologist or postpone the appointment until he was able to.  She acknowledged receipt and thanked me for the return call.

## 2019-10-13 LAB — CUP PACEART REMOTE DEVICE CHECK
Battery Impedance: 255 Ohm
Battery Remaining Longevity: 117 mo
Battery Voltage: 2.79 V
Brady Statistic AP VP Percent: 0 %
Brady Statistic AP VS Percent: 50 %
Brady Statistic AS VP Percent: 0 %
Brady Statistic AS VS Percent: 50 %
Date Time Interrogation Session: 20210722160343
Implantable Lead Implant Date: 20160301
Implantable Lead Implant Date: 20160301
Implantable Lead Location: 753859
Implantable Lead Location: 753860
Implantable Lead Model: 5076
Implantable Lead Model: 5076
Implantable Pulse Generator Implant Date: 20160301
Lead Channel Impedance Value: 431 Ohm
Lead Channel Impedance Value: 669 Ohm
Lead Channel Pacing Threshold Amplitude: 0.5 V
Lead Channel Pacing Threshold Amplitude: 0.5 V
Lead Channel Pacing Threshold Pulse Width: 0.4 ms
Lead Channel Pacing Threshold Pulse Width: 0.4 ms
Lead Channel Setting Pacing Amplitude: 2 V
Lead Channel Setting Pacing Amplitude: 2.5 V
Lead Channel Setting Pacing Pulse Width: 0.4 ms
Lead Channel Setting Sensing Sensitivity: 5.6 mV

## 2019-10-13 NOTE — Progress Notes (Signed)
Remote pacemaker transmission.   

## 2019-10-24 ENCOUNTER — Other Ambulatory Visit: Payer: Self-pay

## 2019-10-24 ENCOUNTER — Ambulatory Visit (INDEPENDENT_AMBULATORY_CARE_PROVIDER_SITE_OTHER): Payer: PPO

## 2019-10-24 DIAGNOSIS — I4891 Unspecified atrial fibrillation: Secondary | ICD-10-CM

## 2019-10-24 DIAGNOSIS — Z0000 Encounter for general adult medical examination without abnormal findings: Secondary | ICD-10-CM

## 2019-10-24 DIAGNOSIS — I480 Paroxysmal atrial fibrillation: Secondary | ICD-10-CM

## 2019-10-24 DIAGNOSIS — I5033 Acute on chronic diastolic (congestive) heart failure: Secondary | ICD-10-CM

## 2019-10-24 DIAGNOSIS — Z5181 Encounter for therapeutic drug level monitoring: Secondary | ICD-10-CM | POA: Diagnosis not present

## 2019-10-24 DIAGNOSIS — Z954 Presence of other heart-valve replacement: Secondary | ICD-10-CM

## 2019-10-24 LAB — POCT INR: INR: 2.1 (ref 2.0–3.0)

## 2019-10-24 NOTE — Patient Instructions (Signed)
Take 2 tablets today and then continue warfarin 1 tablet daily except 1.5 tablets each Sundays, Tuesdays and Thursdays.  Continue greens/salads Repeat INR in 6 weeks

## 2019-10-30 DIAGNOSIS — R5383 Other fatigue: Secondary | ICD-10-CM | POA: Diagnosis not present

## 2019-10-30 DIAGNOSIS — E1122 Type 2 diabetes mellitus with diabetic chronic kidney disease: Secondary | ICD-10-CM | POA: Diagnosis not present

## 2019-10-30 DIAGNOSIS — N138 Other obstructive and reflux uropathy: Secondary | ICD-10-CM | POA: Diagnosis not present

## 2019-10-30 DIAGNOSIS — E039 Hypothyroidism, unspecified: Secondary | ICD-10-CM | POA: Diagnosis not present

## 2019-10-30 DIAGNOSIS — Z85528 Personal history of other malignant neoplasm of kidney: Secondary | ICD-10-CM | POA: Diagnosis not present

## 2019-10-30 DIAGNOSIS — E782 Mixed hyperlipidemia: Secondary | ICD-10-CM | POA: Diagnosis not present

## 2019-10-30 DIAGNOSIS — N183 Chronic kidney disease, stage 3 unspecified: Secondary | ICD-10-CM | POA: Diagnosis not present

## 2019-10-30 DIAGNOSIS — I480 Paroxysmal atrial fibrillation: Secondary | ICD-10-CM | POA: Diagnosis not present

## 2019-10-30 DIAGNOSIS — R350 Frequency of micturition: Secondary | ICD-10-CM | POA: Diagnosis not present

## 2019-10-30 DIAGNOSIS — N401 Enlarged prostate with lower urinary tract symptoms: Secondary | ICD-10-CM | POA: Diagnosis not present

## 2019-10-30 DIAGNOSIS — R5381 Other malaise: Secondary | ICD-10-CM | POA: Diagnosis not present

## 2019-10-30 DIAGNOSIS — F320 Major depressive disorder, single episode, mild: Secondary | ICD-10-CM | POA: Diagnosis not present

## 2019-10-30 DIAGNOSIS — I129 Hypertensive chronic kidney disease with stage 1 through stage 4 chronic kidney disease, or unspecified chronic kidney disease: Secondary | ICD-10-CM | POA: Diagnosis not present

## 2019-11-13 DIAGNOSIS — E1122 Type 2 diabetes mellitus with diabetic chronic kidney disease: Secondary | ICD-10-CM | POA: Diagnosis not present

## 2019-11-13 DIAGNOSIS — R7989 Other specified abnormal findings of blood chemistry: Secondary | ICD-10-CM | POA: Diagnosis not present

## 2019-11-13 DIAGNOSIS — N183 Chronic kidney disease, stage 3 unspecified: Secondary | ICD-10-CM | POA: Diagnosis not present

## 2019-11-13 DIAGNOSIS — F321 Major depressive disorder, single episode, moderate: Secondary | ICD-10-CM | POA: Diagnosis not present

## 2019-11-15 DIAGNOSIS — N183 Chronic kidney disease, stage 3 unspecified: Secondary | ICD-10-CM | POA: Diagnosis not present

## 2019-12-05 ENCOUNTER — Ambulatory Visit (INDEPENDENT_AMBULATORY_CARE_PROVIDER_SITE_OTHER): Payer: PPO

## 2019-12-05 ENCOUNTER — Other Ambulatory Visit: Payer: Self-pay | Admitting: Family Medicine

## 2019-12-05 ENCOUNTER — Other Ambulatory Visit: Payer: Self-pay

## 2019-12-05 DIAGNOSIS — Z954 Presence of other heart-valve replacement: Secondary | ICD-10-CM

## 2019-12-05 DIAGNOSIS — I480 Paroxysmal atrial fibrillation: Secondary | ICD-10-CM

## 2019-12-05 DIAGNOSIS — Z0000 Encounter for general adult medical examination without abnormal findings: Secondary | ICD-10-CM

## 2019-12-05 DIAGNOSIS — E1122 Type 2 diabetes mellitus with diabetic chronic kidney disease: Secondary | ICD-10-CM

## 2019-12-05 DIAGNOSIS — I4891 Unspecified atrial fibrillation: Secondary | ICD-10-CM

## 2019-12-05 DIAGNOSIS — Z5181 Encounter for therapeutic drug level monitoring: Secondary | ICD-10-CM | POA: Diagnosis not present

## 2019-12-05 DIAGNOSIS — I5033 Acute on chronic diastolic (congestive) heart failure: Secondary | ICD-10-CM

## 2019-12-05 LAB — POCT INR: INR: 2.2 (ref 2.0–3.0)

## 2019-12-05 NOTE — Patient Instructions (Signed)
Take 2 tablets today and then increase warfarin to 1.5 tablets daily except 1 tablet each Monday, Wednesday and Friday Continue greens/salads Repeat INR in 4 weeks

## 2019-12-22 DIAGNOSIS — F321 Major depressive disorder, single episode, moderate: Secondary | ICD-10-CM | POA: Diagnosis not present

## 2019-12-22 DIAGNOSIS — F5101 Primary insomnia: Secondary | ICD-10-CM | POA: Diagnosis not present

## 2019-12-28 DIAGNOSIS — F321 Major depressive disorder, single episode, moderate: Secondary | ICD-10-CM | POA: Diagnosis not present

## 2019-12-28 DIAGNOSIS — F5101 Primary insomnia: Secondary | ICD-10-CM | POA: Diagnosis not present

## 2019-12-30 ENCOUNTER — Other Ambulatory Visit: Payer: Self-pay | Admitting: Cardiology

## 2019-12-30 ENCOUNTER — Other Ambulatory Visit: Payer: Self-pay | Admitting: Family Medicine

## 2019-12-30 DIAGNOSIS — E039 Hypothyroidism, unspecified: Secondary | ICD-10-CM

## 2020-01-02 ENCOUNTER — Other Ambulatory Visit: Payer: Self-pay

## 2020-01-02 ENCOUNTER — Ambulatory Visit (INDEPENDENT_AMBULATORY_CARE_PROVIDER_SITE_OTHER): Payer: PPO

## 2020-01-02 DIAGNOSIS — I4891 Unspecified atrial fibrillation: Secondary | ICD-10-CM

## 2020-01-02 DIAGNOSIS — I5033 Acute on chronic diastolic (congestive) heart failure: Secondary | ICD-10-CM

## 2020-01-02 DIAGNOSIS — Z5181 Encounter for therapeutic drug level monitoring: Secondary | ICD-10-CM | POA: Diagnosis not present

## 2020-01-02 DIAGNOSIS — Z0000 Encounter for general adult medical examination without abnormal findings: Secondary | ICD-10-CM | POA: Diagnosis not present

## 2020-01-02 DIAGNOSIS — I480 Paroxysmal atrial fibrillation: Secondary | ICD-10-CM | POA: Diagnosis not present

## 2020-01-02 DIAGNOSIS — Z954 Presence of other heart-valve replacement: Secondary | ICD-10-CM

## 2020-01-02 LAB — POCT INR: INR: 4.4 — AB (ref 2.0–3.0)

## 2020-01-02 NOTE — Patient Instructions (Signed)
Hold tonight and then continue  1.5 tablets daily except 1 tablet each Monday, Wednesday and Friday  Repeat INR in 2 weeks

## 2020-01-10 ENCOUNTER — Other Ambulatory Visit: Payer: Self-pay | Admitting: Cardiology

## 2020-01-10 DIAGNOSIS — W19XXXA Unspecified fall, initial encounter: Secondary | ICD-10-CM

## 2020-01-10 DIAGNOSIS — R42 Dizziness and giddiness: Secondary | ICD-10-CM

## 2020-01-11 ENCOUNTER — Ambulatory Visit (INDEPENDENT_AMBULATORY_CARE_PROVIDER_SITE_OTHER): Payer: PPO

## 2020-01-11 DIAGNOSIS — I442 Atrioventricular block, complete: Secondary | ICD-10-CM

## 2020-01-12 LAB — CUP PACEART REMOTE DEVICE CHECK
Battery Impedance: 328 Ohm
Battery Remaining Longevity: 109 mo
Battery Voltage: 2.78 V
Brady Statistic AP VP Percent: 0 %
Brady Statistic AP VS Percent: 49 %
Brady Statistic AS VP Percent: 0 %
Brady Statistic AS VS Percent: 50 %
Date Time Interrogation Session: 20211021154209
Implantable Lead Implant Date: 20160301
Implantable Lead Implant Date: 20160301
Implantable Lead Location: 753859
Implantable Lead Location: 753860
Implantable Lead Model: 5076
Implantable Lead Model: 5076
Implantable Pulse Generator Implant Date: 20160301
Lead Channel Impedance Value: 448 Ohm
Lead Channel Impedance Value: 647 Ohm
Lead Channel Pacing Threshold Amplitude: 0.625 V
Lead Channel Pacing Threshold Amplitude: 0.625 V
Lead Channel Pacing Threshold Pulse Width: 0.4 ms
Lead Channel Pacing Threshold Pulse Width: 0.4 ms
Lead Channel Setting Pacing Amplitude: 2 V
Lead Channel Setting Pacing Amplitude: 2.5 V
Lead Channel Setting Pacing Pulse Width: 0.4 ms
Lead Channel Setting Sensing Sensitivity: 4 mV

## 2020-01-16 ENCOUNTER — Ambulatory Visit (INDEPENDENT_AMBULATORY_CARE_PROVIDER_SITE_OTHER): Payer: PPO | Admitting: Pharmacist

## 2020-01-16 ENCOUNTER — Other Ambulatory Visit: Payer: Self-pay

## 2020-01-16 DIAGNOSIS — N179 Acute kidney failure, unspecified: Secondary | ICD-10-CM | POA: Diagnosis not present

## 2020-01-16 DIAGNOSIS — C649 Malignant neoplasm of unspecified kidney, except renal pelvis: Secondary | ICD-10-CM | POA: Diagnosis not present

## 2020-01-16 DIAGNOSIS — E1122 Type 2 diabetes mellitus with diabetic chronic kidney disease: Secondary | ICD-10-CM | POA: Diagnosis not present

## 2020-01-16 DIAGNOSIS — I509 Heart failure, unspecified: Secondary | ICD-10-CM | POA: Diagnosis not present

## 2020-01-16 DIAGNOSIS — I480 Paroxysmal atrial fibrillation: Secondary | ICD-10-CM

## 2020-01-16 DIAGNOSIS — Z954 Presence of other heart-valve replacement: Secondary | ICD-10-CM | POA: Diagnosis not present

## 2020-01-16 DIAGNOSIS — I129 Hypertensive chronic kidney disease with stage 1 through stage 4 chronic kidney disease, or unspecified chronic kidney disease: Secondary | ICD-10-CM | POA: Diagnosis not present

## 2020-01-16 DIAGNOSIS — Z8679 Personal history of other diseases of the circulatory system: Secondary | ICD-10-CM | POA: Diagnosis not present

## 2020-01-16 DIAGNOSIS — Z952 Presence of prosthetic heart valve: Secondary | ICD-10-CM | POA: Diagnosis not present

## 2020-01-16 DIAGNOSIS — Z0000 Encounter for general adult medical examination without abnormal findings: Secondary | ICD-10-CM

## 2020-01-16 DIAGNOSIS — I050 Rheumatic mitral stenosis: Secondary | ICD-10-CM

## 2020-01-16 DIAGNOSIS — I5033 Acute on chronic diastolic (congestive) heart failure: Secondary | ICD-10-CM | POA: Diagnosis not present

## 2020-01-16 DIAGNOSIS — Z7185 Encounter for immunization safety counseling: Secondary | ICD-10-CM | POA: Diagnosis not present

## 2020-01-16 DIAGNOSIS — D751 Secondary polycythemia: Secondary | ICD-10-CM | POA: Diagnosis not present

## 2020-01-16 DIAGNOSIS — N1831 Chronic kidney disease, stage 3a: Secondary | ICD-10-CM | POA: Diagnosis not present

## 2020-01-16 DIAGNOSIS — I4891 Unspecified atrial fibrillation: Secondary | ICD-10-CM

## 2020-01-16 LAB — POCT INR: INR: 2.7 (ref 2.0–3.0)

## 2020-01-16 NOTE — Patient Instructions (Signed)
Continue 1.5 tablets daily except 1 tablet each Monday, Wednesday and Friday Repeat INR in 3 weeks

## 2020-01-17 NOTE — Progress Notes (Signed)
Remote pacemaker transmission.   

## 2020-01-18 DIAGNOSIS — K219 Gastro-esophageal reflux disease without esophagitis: Secondary | ICD-10-CM | POA: Diagnosis not present

## 2020-01-18 DIAGNOSIS — R5381 Other malaise: Secondary | ICD-10-CM | POA: Diagnosis not present

## 2020-01-18 DIAGNOSIS — R7989 Other specified abnormal findings of blood chemistry: Secondary | ICD-10-CM | POA: Diagnosis not present

## 2020-01-18 DIAGNOSIS — F5101 Primary insomnia: Secondary | ICD-10-CM | POA: Diagnosis not present

## 2020-01-18 DIAGNOSIS — E1122 Type 2 diabetes mellitus with diabetic chronic kidney disease: Secondary | ICD-10-CM | POA: Diagnosis not present

## 2020-01-18 DIAGNOSIS — I129 Hypertensive chronic kidney disease with stage 1 through stage 4 chronic kidney disease, or unspecified chronic kidney disease: Secondary | ICD-10-CM | POA: Diagnosis not present

## 2020-01-18 DIAGNOSIS — I10 Essential (primary) hypertension: Secondary | ICD-10-CM | POA: Diagnosis not present

## 2020-01-18 DIAGNOSIS — N183 Chronic kidney disease, stage 3 unspecified: Secondary | ICD-10-CM | POA: Diagnosis not present

## 2020-01-18 DIAGNOSIS — E039 Hypothyroidism, unspecified: Secondary | ICD-10-CM | POA: Diagnosis not present

## 2020-01-18 DIAGNOSIS — R5383 Other fatigue: Secondary | ICD-10-CM | POA: Diagnosis not present

## 2020-01-18 DIAGNOSIS — I480 Paroxysmal atrial fibrillation: Secondary | ICD-10-CM | POA: Diagnosis not present

## 2020-01-18 DIAGNOSIS — F321 Major depressive disorder, single episode, moderate: Secondary | ICD-10-CM | POA: Diagnosis not present

## 2020-01-18 DIAGNOSIS — N1831 Chronic kidney disease, stage 3a: Secondary | ICD-10-CM | POA: Diagnosis not present

## 2020-01-23 DIAGNOSIS — Z905 Acquired absence of kidney: Secondary | ICD-10-CM | POA: Diagnosis not present

## 2020-01-23 DIAGNOSIS — E349 Endocrine disorder, unspecified: Secondary | ICD-10-CM | POA: Diagnosis not present

## 2020-01-23 DIAGNOSIS — R351 Nocturia: Secondary | ICD-10-CM | POA: Diagnosis not present

## 2020-01-23 DIAGNOSIS — N281 Cyst of kidney, acquired: Secondary | ICD-10-CM | POA: Diagnosis not present

## 2020-01-23 DIAGNOSIS — N183 Chronic kidney disease, stage 3 unspecified: Secondary | ICD-10-CM | POA: Diagnosis not present

## 2020-01-23 DIAGNOSIS — C641 Malignant neoplasm of right kidney, except renal pelvis: Secondary | ICD-10-CM | POA: Diagnosis not present

## 2020-02-06 ENCOUNTER — Other Ambulatory Visit: Payer: Self-pay

## 2020-02-06 ENCOUNTER — Ambulatory Visit (INDEPENDENT_AMBULATORY_CARE_PROVIDER_SITE_OTHER): Payer: PPO

## 2020-02-06 DIAGNOSIS — Z5181 Encounter for therapeutic drug level monitoring: Secondary | ICD-10-CM

## 2020-02-06 DIAGNOSIS — Z954 Presence of other heart-valve replacement: Secondary | ICD-10-CM

## 2020-02-06 DIAGNOSIS — I5033 Acute on chronic diastolic (congestive) heart failure: Secondary | ICD-10-CM

## 2020-02-06 DIAGNOSIS — Z0000 Encounter for general adult medical examination without abnormal findings: Secondary | ICD-10-CM

## 2020-02-06 DIAGNOSIS — I480 Paroxysmal atrial fibrillation: Secondary | ICD-10-CM | POA: Diagnosis not present

## 2020-02-06 DIAGNOSIS — I4891 Unspecified atrial fibrillation: Secondary | ICD-10-CM

## 2020-02-06 LAB — POCT INR: INR: 2.2 (ref 2.0–3.0)

## 2020-02-06 NOTE — Patient Instructions (Signed)
Take 2 tablets today only and then Continue 1.5 tablets daily except 1 tablet each Monday, Wednesday and Friday Repeat INR in 4 weeks

## 2020-02-29 ENCOUNTER — Other Ambulatory Visit: Payer: Self-pay | Admitting: Family Medicine

## 2020-02-29 DIAGNOSIS — E1122 Type 2 diabetes mellitus with diabetic chronic kidney disease: Secondary | ICD-10-CM

## 2020-03-05 ENCOUNTER — Ambulatory Visit (INDEPENDENT_AMBULATORY_CARE_PROVIDER_SITE_OTHER): Payer: PPO

## 2020-03-05 ENCOUNTER — Other Ambulatory Visit: Payer: Self-pay

## 2020-03-05 DIAGNOSIS — Z5181 Encounter for therapeutic drug level monitoring: Secondary | ICD-10-CM

## 2020-03-05 DIAGNOSIS — I5033 Acute on chronic diastolic (congestive) heart failure: Secondary | ICD-10-CM

## 2020-03-05 DIAGNOSIS — Z0000 Encounter for general adult medical examination without abnormal findings: Secondary | ICD-10-CM | POA: Diagnosis not present

## 2020-03-05 DIAGNOSIS — I480 Paroxysmal atrial fibrillation: Secondary | ICD-10-CM

## 2020-03-05 DIAGNOSIS — Z954 Presence of other heart-valve replacement: Secondary | ICD-10-CM

## 2020-03-05 DIAGNOSIS — I4891 Unspecified atrial fibrillation: Secondary | ICD-10-CM

## 2020-03-05 LAB — POCT INR: INR: 2.9 (ref 2.0–3.0)

## 2020-03-05 NOTE — Patient Instructions (Signed)
Continue 1.5 tablets daily except 1 tablet each Monday, Wednesday and Friday Repeat INR in 6 weeks  

## 2020-03-07 ENCOUNTER — Ambulatory Visit: Payer: PPO | Admitting: Cardiology

## 2020-03-21 ENCOUNTER — Other Ambulatory Visit: Payer: Self-pay | Admitting: Internal Medicine

## 2020-03-21 DIAGNOSIS — W19XXXA Unspecified fall, initial encounter: Secondary | ICD-10-CM

## 2020-03-21 DIAGNOSIS — R42 Dizziness and giddiness: Secondary | ICD-10-CM

## 2020-03-25 ENCOUNTER — Other Ambulatory Visit: Payer: Self-pay | Admitting: Cardiology

## 2020-03-25 DIAGNOSIS — W19XXXA Unspecified fall, initial encounter: Secondary | ICD-10-CM

## 2020-03-25 DIAGNOSIS — R42 Dizziness and giddiness: Secondary | ICD-10-CM

## 2020-04-11 ENCOUNTER — Ambulatory Visit (INDEPENDENT_AMBULATORY_CARE_PROVIDER_SITE_OTHER): Payer: PPO

## 2020-04-11 DIAGNOSIS — I480 Paroxysmal atrial fibrillation: Secondary | ICD-10-CM | POA: Diagnosis not present

## 2020-04-12 LAB — CUP PACEART REMOTE DEVICE CHECK
Battery Impedance: 328 Ohm
Battery Remaining Longevity: 109 mo
Battery Voltage: 2.79 V
Brady Statistic AP VP Percent: 0 %
Brady Statistic AP VS Percent: 50 %
Brady Statistic AS VP Percent: 0 %
Brady Statistic AS VS Percent: 49 %
Date Time Interrogation Session: 20220121124318
Implantable Lead Implant Date: 20160301
Implantable Lead Implant Date: 20160301
Implantable Lead Location: 753859
Implantable Lead Location: 753860
Implantable Lead Model: 5076
Implantable Lead Model: 5076
Implantable Pulse Generator Implant Date: 20160301
Lead Channel Impedance Value: 437 Ohm
Lead Channel Impedance Value: 663 Ohm
Lead Channel Pacing Threshold Amplitude: 0.625 V
Lead Channel Pacing Threshold Amplitude: 0.625 V
Lead Channel Pacing Threshold Pulse Width: 0.4 ms
Lead Channel Pacing Threshold Pulse Width: 0.4 ms
Lead Channel Setting Pacing Amplitude: 2 V
Lead Channel Setting Pacing Amplitude: 2.5 V
Lead Channel Setting Pacing Pulse Width: 0.4 ms
Lead Channel Setting Sensing Sensitivity: 4 mV

## 2020-04-16 ENCOUNTER — Ambulatory Visit (INDEPENDENT_AMBULATORY_CARE_PROVIDER_SITE_OTHER): Payer: PPO

## 2020-04-16 ENCOUNTER — Other Ambulatory Visit: Payer: Self-pay

## 2020-04-16 DIAGNOSIS — Z5181 Encounter for therapeutic drug level monitoring: Secondary | ICD-10-CM | POA: Diagnosis not present

## 2020-04-16 DIAGNOSIS — I480 Paroxysmal atrial fibrillation: Secondary | ICD-10-CM | POA: Diagnosis not present

## 2020-04-16 DIAGNOSIS — Z954 Presence of other heart-valve replacement: Secondary | ICD-10-CM

## 2020-04-16 DIAGNOSIS — I5033 Acute on chronic diastolic (congestive) heart failure: Secondary | ICD-10-CM

## 2020-04-16 DIAGNOSIS — Z0000 Encounter for general adult medical examination without abnormal findings: Secondary | ICD-10-CM | POA: Diagnosis not present

## 2020-04-16 DIAGNOSIS — I4891 Unspecified atrial fibrillation: Secondary | ICD-10-CM

## 2020-04-16 LAB — POCT INR: INR: 1.8 — AB (ref 2.0–3.0)

## 2020-04-16 NOTE — Patient Instructions (Signed)
Take 2.5 tablets tonight only and then Continue 1.5 tablets daily except 1 tablet each Monday, Wednesday and Friday Repeat INR in 6 weeks

## 2020-04-17 ENCOUNTER — Ambulatory Visit: Payer: PPO | Admitting: Cardiology

## 2020-04-23 NOTE — Progress Notes (Signed)
Remote pacemaker transmission.   

## 2020-05-07 DIAGNOSIS — N183 Chronic kidney disease, stage 3 unspecified: Secondary | ICD-10-CM | POA: Diagnosis not present

## 2020-05-15 ENCOUNTER — Encounter (HOSPITAL_COMMUNITY): Payer: Self-pay | Admitting: Nurse Practitioner

## 2020-05-15 ENCOUNTER — Other Ambulatory Visit: Payer: Self-pay

## 2020-05-15 ENCOUNTER — Ambulatory Visit (HOSPITAL_COMMUNITY)
Admission: RE | Admit: 2020-05-15 | Discharge: 2020-05-15 | Disposition: A | Discharge status: Home / Self Care | Payer: PPO | Source: Ambulatory Visit | Attending: Nurse Practitioner | Admitting: Nurse Practitioner

## 2020-05-15 VITALS — BP 112/70 | HR 66 | Ht 68.0 in | Wt 197.6 lb

## 2020-05-15 DIAGNOSIS — E1122 Type 2 diabetes mellitus with diabetic chronic kidney disease: Secondary | ICD-10-CM | POA: Diagnosis not present

## 2020-05-15 DIAGNOSIS — I099 Rheumatic heart disease, unspecified: Secondary | ICD-10-CM | POA: Diagnosis not present

## 2020-05-15 DIAGNOSIS — Z7984 Long term (current) use of oral hypoglycemic drugs: Secondary | ICD-10-CM | POA: Diagnosis not present

## 2020-05-15 DIAGNOSIS — R5383 Other fatigue: Secondary | ICD-10-CM | POA: Diagnosis not present

## 2020-05-15 DIAGNOSIS — I130 Hypertensive heart and chronic kidney disease with heart failure and stage 1 through stage 4 chronic kidney disease, or unspecified chronic kidney disease: Secondary | ICD-10-CM | POA: Insufficient documentation

## 2020-05-15 DIAGNOSIS — I5032 Chronic diastolic (congestive) heart failure: Secondary | ICD-10-CM | POA: Insufficient documentation

## 2020-05-15 DIAGNOSIS — G4733 Obstructive sleep apnea (adult) (pediatric): Secondary | ICD-10-CM | POA: Insufficient documentation

## 2020-05-15 DIAGNOSIS — Z7901 Long term (current) use of anticoagulants: Secondary | ICD-10-CM | POA: Diagnosis not present

## 2020-05-15 DIAGNOSIS — F32A Depression, unspecified: Secondary | ICD-10-CM | POA: Diagnosis not present

## 2020-05-15 DIAGNOSIS — F17210 Nicotine dependence, cigarettes, uncomplicated: Secondary | ICD-10-CM | POA: Insufficient documentation

## 2020-05-15 DIAGNOSIS — I480 Paroxysmal atrial fibrillation: Secondary | ICD-10-CM | POA: Diagnosis not present

## 2020-05-15 DIAGNOSIS — Z7982 Long term (current) use of aspirin: Secondary | ICD-10-CM | POA: Diagnosis not present

## 2020-05-15 DIAGNOSIS — D6869 Other thrombophilia: Secondary | ICD-10-CM

## 2020-05-15 DIAGNOSIS — Z79899 Other long term (current) drug therapy: Secondary | ICD-10-CM | POA: Diagnosis not present

## 2020-05-15 DIAGNOSIS — Z8249 Family history of ischemic heart disease and other diseases of the circulatory system: Secondary | ICD-10-CM | POA: Insufficient documentation

## 2020-05-15 DIAGNOSIS — N182 Chronic kidney disease, stage 2 (mild): Secondary | ICD-10-CM | POA: Diagnosis not present

## 2020-05-15 NOTE — Progress Notes (Signed)
Primary Care Physician: Gweneth Fritter, FNP Referring Physician: Dr. 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  He is in the afib clinic for long term surveillance of afib s/p maze procedure. He received a PPM at time of surgery. Interrogations  of his device have not shown any afib. He has had depression that has required treatment since his surgery. He feels well today. He has not noted any irregular heart beat. He feels much improved with surgery and his fatigue and shortness of breath is much improved. He continues on warfarin for metal valve. His wife states that his BP was up slightly with Dr. Francesca Oman visit a few weeks ago but stable today. Dr. [name] has encouraged for him to try to be active but he struggles with depression and lack of motivation.  F/u in afib clinic 05/16/19, for long term surveillance of Maze procedure performed  in 2016. Marland Kitchen He voices no complaints, denies shortness of breath. EKG shows atrial paced rhythm at 83 bpm. Continues  on coumadin for a CHA2DS2VASc score of 4.  F/u in afib clinic, 05/15/20. He is here for long term surveillance of afib s/p Maze procedure performed in 2016. He remains in SR. He has not noted any afib or irregular heart rhythm. He is fatigued. He continues to smoke 10 cigarettes a day and doe not get any exercise. He sits all day. States his health went downhill when he retired. He has no motivation to exercise. He continues on warfarin followed in [place].   Today, he denies symptoms of palpitations, chest pain, shortness of breath, orthopnea, PND, lower extremity edema, dizziness, presyncope, syncope, or neurologic sequela. The patient is tolerating medications without difficulties and  is otherwise without complaint today.   Past Medical History:  Diagnosis Date  . Anxiety   . Borderline diabetes   . CAD (coronary artery disease)    a. By cath 08/2013: 70% distal cx-prox LPDA; tandem mod LAD lesions, o/w mild dz.  . CKD (chronic kidney disease), stage II    his creat. runs 2-2.5; hasnt seen neph yet - sees dr. Tresa Moore at [facility]  . Complete heart block (HCC)    a. s/p PPM   . Deafness in left ear   . Depression   . Diabetes mellitus without complication ([place])   . Diverticulitis of colon 15 years ago  . Hernia   . HTN (hypertension)   . Hyperlipidemia   . Hypogonadism male   . Hypothyroidism   . Lung nodule seen on imaging study 04/10/2014   5 mm nodule right lower lobe  . Mitral stenosis    a. Dx 07/2013 - through workup of 2D echo, TEE, and cath, felt to be moderate.  . Obesity   . OSA on CPAP   . Persistent atrial fibrillation ([place])    a. sustained 160-180 on e-CARDIO monitor placed 07/29/2013. Placed on IV amiodarone at end of May 2015 due to continued paroxysms with RVR.  Marland Kitchen Renal cell carcinoma of right kidney ([place]) 09/05/2013   clear cell renal cell carcinoma of right kidney treated by radical nephrectomy, Fuhrman grade 2-3, with maximum tumor diameter 2.7 cm, all tumor to find confined to the kidney, and all surgical margins negative (  T1aN0).   . Renal mass    a. Dx 08/2013: concerning for renal cancer.  . Rheumatic heart disease   . S/P Minimally invasive maze operation for atrial fibrillation 05/15/2014   Left side lesion set using cryothermy with clipping of LA appendage  . S/P minimally invasive mitral valve replacement with metallic valve and maze procedure 05/15/2014   88m Sorin Carbomedics Optiform mechanical valve placed via right mini thoracotomy approach  . Stroke ([facility] 1998   "MRI showed 3 mild strokes"   Past Surgical History:  Procedure Laterality Date  . EYE SURGERY Right 1990   "reconstructive, lens implant- from paintball accident"  .  INGUINAL HERNIA REPAIR Bilateral first one in 80's   "I had one side done twice"  . LEFT AND RIGHT HEART CATHETERIZATION WITH CORONARY ANGIOGRAM N/A 08/28/2013   Procedure: LEFT AND RIGHT HEART CATHETERIZATION WITH CORONARY ANGIOGRAM;  Surgeon: DLeonie Man MD;  [facility]  Service: Cardiovascular;  Laterality: N/A;  . MINIMALLY INVASIVE MAZE PROCEDURE N/A 05/15/2014   Procedure: MINIMALLY INVASIVE MAZE PROCEDURE;  Surgeon: CRexene Alberts MD;  [place] [facility]  Service: Open Heart Surgery;  Laterality: N/A;  . MITRAL VALVE REPLACEMENT Right 05/15/2014   Procedure: MINIMALLY INVASIVE MITRAL VALVE (MV) REPLACEMENT;  Surgeon: CRexene Alberts MD;  [facility]  Service: Open Heart Surgery;  Laterality: Right;  . PERMANENT PACEMAKER INSERTION Left 05/22/2014   a. MDT dual chamber PPM implanted by Dr ARayann Hemanfor CHB s/p MAZE/MVR  . ROBOT ASSISTED LAPAROSCOPIC NEPHRECTOMY Right 09/05/2013   Procedure: ROBOTIC ASSISTED LAPAROSCOPIC RIGHT RADICAL NEPHRECTOMY;  Surgeon: DSharyn Creamer MD;  [facility];  Service: Urology;  Laterality: Right;  . TEE WITHOUT CARDIOVERSION N/A 08/16/2013   Procedure: TRANSESOPHAGEAL ECHOCARDIOGRAM (TEE);  Surgeon: KDorothy Spark MD;  [facility]  Service: Cardiovascular;  Laterality: N/A;  . TEE WITHOUT CARDIOVERSION N/A 05/15/2014   Procedure: TRANSESOPHAGEAL ECHOCARDIOGRAM (TEE);  Surgeon: CRexene Alberts MD;  [place] [facility]  Service: Open Heart Surgery;  Laterality: N/A;    Current Outpatient Medications  Medication Sig Dispense Refill  . acetaminophen (TYLENOL) 325 MG tablet Take 325 mg by mouth every 6 (six) hours as needed (pain).     .Marland Kitchenalfuzosin (UROXATRAL) 10 MG 24 hr tablet Take 10 mg by mouth daily with breakfast.    . amLODipine (NORVASC) 10 MG tablet TAKE 1 TABLET (10 MG TOTAL) BY MOUTH DAILY. 90 tablet 1  . aspirin EC 81 MG tablet Take 1 tablet (81 mg total) by mouth daily.    . Blood Glucose Monitoring Suppl (ONE TOUCH  ULTRA MINI) w/Device KIT Use meter to check blood sugars 1-4 times daily as instructed. 1 each 0  . glucose blood (ONE TOUCH ULTRA TEST) test strip Use one strip per test. Test blood sugars 1-4 times daily as instructed. 100 each 12  . Lancets (ONETOUCH DELICA PLUS [id number] MISC USE 1 LANCET PER TEST. TEST BLOOD SUGARS 1 4 TIMES PER DAY AS INSTRUCTED.    .Marland Kitchenlevothyroxine (SYNTHROID) 75 MCG tablet TAKE 1 TABLET (75 MCG TOTAL) BY MOUTH DAILY BEFORE BREAKFAST. 90 tablet 2  . loratadine (CLARITIN) 10 MG tablet Take 1 tablet (10 mg total) by mouth daily.    .Marland KitchenMELATONIN GUMMIES PO Take by mouth. Pt states taking 2 tablets 20 mg at night but making him more sleepy so taking 1 at night    . metFORMIN (GLUCOPHAGE) 500 MG tablet TAKE 1 TABLET (500 MG TOTAL) BY  MOUTH 2 (TWO) TIMES DAILY WITH A MEAL. 180 tablet 0  . metoprolol tartrate (LOPRESSOR) 25 MG tablet TAKE 0.5 TABLETS BY MOUTH 2 TIMES DAILY. 90 tablet 1  . omega-3 acid ethyl esters (LOVAZA) 1 g capsule TAKE 2 CAPSULES (2 G TOTAL) BY MOUTH 2 (TWO) TIMES DAILY. 360 capsule 2  . OneTouch Delica Lancets 17G MISC USE TO TEST BLOOD SUGARS 1-4 TIMES PER DAY AS INSTRUCTED 100 each 1  . sertraline (ZOLOFT) 50 MG tablet Take 50 mg by mouth daily.    . vitamin B-12 (CYANOCOBALAMIN) 1000 MCG tablet Take 1,000 mcg by mouth daily.    Marland Kitchen warfarin (COUMADIN) 5 MG tablet TAKE 1 TO 1.5 TABLETS DAILY AS DIRECTED BY ANTICOAGULATION CLINIC 120 tablet 1   No current facility-administered medications for this encounter.    Allergies  Allergen Reactions  . Contrast Media [Iodinated Diagnostic Agents] Hives and Other (See Comments)    Patient started sneezing and coughing, patient broke out in hives, patient needs 13 hour prep if having IV contrast  . Ioxaglate Hives and Rash    Patient started sneezing and coughing, patient broke out in hives, patient needs 13 hour prep if having IV contrast  . Atorvastatin Other (See Comments)    Myalgias  . Crestor [Rosuvastatin]  Other (See Comments)    Myalgias  . Fenofibrate Other (See Comments)    MYALGIAS   . Pravastatin Other (See Comments)    Myalgias   . Pseudoephedrine Hcl Palpitations    Social History   Socioeconomic History  . Marital status: Married    Spouse name: Not on file  . Number of children: 2  . Years of education: 20  . Highest education level: Not on file  Occupational History  . Occupation: Retired     Comment: Warehouse manager  Tobacco Use  . Smoking status: Current Every Day Smoker    Packs/day: 0.75    Years: 50.00    Pack years: 37.50    Types: Cigarettes, Cigars  . Smokeless tobacco: Never Used  . Tobacco comment: Cigar every now and then  Vaping Use  . Vaping Use: Never used  Substance and Sexual Activity  . Alcohol use: No    Alcohol/week: 0.0 standard drinks  . Drug use: No  . Sexual activity: Not Currently  Other Topics Concern  . Not on file  Social History Narrative   Fun: Aggravate people.    Right hand    One story home   Social Determinants of Health   Financial Resource Strain: Not on file  Food Insecurity: Not on file  Transportation Needs: Not on file  Physical Activity: Not on file  Stress: Not on file  Social Connections: Not on file  Intimate Partner Violence: Not on file    Family History  Problem Relation Age of Onset  . Hypertension Mother   . Heart attack Mother   . Stroke Mother   . Heart disease Father   . Hypertension Other   . Heart disease Brother   . Diabetes Brother   . Healthy Son   . Healthy Daughter     ROS- All systems are reviewed and negative except as per the HPI above  Physical Exam: There were no vitals filed for this visit. Wt Readings from Last 3 Encounters:  07/18/19 93.9 kg  05/16/19 95.4 kg  01/23/19 98.4 kg    Labs: Lab Results  Component Value Date   NA 136 11/11/2018   K 4.4 11/11/2018  CL 105 11/11/2018   CO2 22 11/11/2018   GLUCOSE 215 (H) 11/11/2018   BUN 22 11/11/2018   CREATININE 1.58  (H) 11/11/2018   CALCIUM 9.7 11/11/2018   MG 2.3 04/08/2018   Lab Results  Component Value Date   INR 1.8 (A) 04/16/2020   Lab Results  Component Value Date   CHOL 160 04/22/2017   HDL 22 (L) 04/22/2017   LDLCALC 77 04/22/2017   TRIG 307 (H) 04/22/2017     GEN- The patient is well appearing, alert and oriented x 3 today.   Head- normocephalic, atraumatic Eyes-  Sclera clear, conjunctiva pink Ears- hearing intact Oropharynx- clear Neck- supple, no JVP Lymph- no cervical lymphadenopathy Lungs- Clear to ausculation bilaterally, normal work of breathing Heart- Regular rate and rhythm, no murmurs, rubs or gallops, PMI not laterally displaced GI- soft, NT, ND, + BS Extremities- no clubbing, cyanosis, or edema MS- no significant deformity or atrophy Skin- no rash or lesion Psych- euthymic mood, full affect Neuro- strength and sensation are intact  EKG-  NSR at 66 bpm, PR int 170 ms, qrs int 136 ms, qtc 423 ms  Epic records reviewed Remote and in office interrogations reviewed   Assessment and Plan: 1. Afib, s/p MVR and Maze procedure, clipping of LAA in 2016 No evidence of recurrent afib Continue coumadin Continue metoprolol at  25 mg 1/2 tab bid  2. PPM Per Dr. Rayann Heman  3. CHA2DS2VASc score of 4 Continue  Coumadin  4. Generalized fatigue Sedentary  lifestyle  Establishing a regular exercise program recommended Offered the [employer] [facility] [employer] program but he lives in [place] and the drive is too great to do this   5. Tobacco abuse Smoking cessation encouraged    F/u with cardiology as scheduled   Butch Penny C. Hunner Garcon, [facility] [address] [place], [place] [address] [phone]

## 2020-05-28 ENCOUNTER — Ambulatory Visit (INDEPENDENT_AMBULATORY_CARE_PROVIDER_SITE_OTHER): Payer: PPO

## 2020-05-28 ENCOUNTER — Other Ambulatory Visit: Payer: Self-pay

## 2020-05-28 DIAGNOSIS — I480 Paroxysmal atrial fibrillation: Secondary | ICD-10-CM

## 2020-05-28 DIAGNOSIS — Z0000 Encounter for general adult medical examination without abnormal findings: Secondary | ICD-10-CM | POA: Diagnosis not present

## 2020-05-28 DIAGNOSIS — Z5181 Encounter for therapeutic drug level monitoring: Secondary | ICD-10-CM

## 2020-05-28 DIAGNOSIS — Z954 Presence of other heart-valve replacement: Secondary | ICD-10-CM

## 2020-05-28 DIAGNOSIS — I5033 Acute on chronic diastolic (congestive) heart failure: Secondary | ICD-10-CM

## 2020-05-28 DIAGNOSIS — I4891 Unspecified atrial fibrillation: Secondary | ICD-10-CM

## 2020-05-28 LAB — POCT INR: INR: 2.9 (ref 2.0–3.0)

## 2020-05-28 NOTE — Patient Instructions (Signed)
Continue 1.5 tablets daily except 1 tablet each Monday, Wednesday and Friday Repeat INR in 6 weeks  

## 2020-07-04 ENCOUNTER — Ambulatory Visit (HOSPITAL_COMMUNITY)
Admission: RE | Admit: 2020-07-04 | Discharge: 2020-07-04 | Disposition: A | Discharge status: Home / Self Care | Payer: PPO | Source: Ambulatory Visit | Attending: Internal Medicine | Admitting: Internal Medicine

## 2020-07-04 ENCOUNTER — Other Ambulatory Visit: Payer: Self-pay

## 2020-07-04 DIAGNOSIS — I63231 Cerebral infarction due to unspecified occlusion or stenosis of right carotid arteries: Secondary | ICD-10-CM | POA: Insufficient documentation

## 2020-07-09 ENCOUNTER — Ambulatory Visit (INDEPENDENT_AMBULATORY_CARE_PROVIDER_SITE_OTHER): Payer: PPO

## 2020-07-09 ENCOUNTER — Other Ambulatory Visit: Payer: Self-pay

## 2020-07-09 ENCOUNTER — Telehealth: Payer: Self-pay | Admitting: *Deleted

## 2020-07-09 DIAGNOSIS — Z0000 Encounter for general adult medical examination without abnormal findings: Secondary | ICD-10-CM

## 2020-07-09 DIAGNOSIS — I4891 Unspecified atrial fibrillation: Secondary | ICD-10-CM | POA: Diagnosis not present

## 2020-07-09 DIAGNOSIS — Z8679 Personal history of other diseases of the circulatory system: Secondary | ICD-10-CM | POA: Diagnosis not present

## 2020-07-09 DIAGNOSIS — I779 Disorder of arteries and arterioles, unspecified: Secondary | ICD-10-CM

## 2020-07-09 DIAGNOSIS — I129 Hypertensive chronic kidney disease with stage 1 through stage 4 chronic kidney disease, or unspecified chronic kidney disease: Secondary | ICD-10-CM | POA: Diagnosis not present

## 2020-07-09 DIAGNOSIS — N183 Chronic kidney disease, stage 3 unspecified: Secondary | ICD-10-CM | POA: Diagnosis not present

## 2020-07-09 DIAGNOSIS — I5033 Acute on chronic diastolic (congestive) heart failure: Secondary | ICD-10-CM

## 2020-07-09 DIAGNOSIS — Z950 Presence of cardiac pacemaker: Secondary | ICD-10-CM | POA: Diagnosis not present

## 2020-07-09 DIAGNOSIS — Z5181 Encounter for therapeutic drug level monitoring: Secondary | ICD-10-CM | POA: Diagnosis not present

## 2020-07-09 DIAGNOSIS — Z954 Presence of other heart-valve replacement: Secondary | ICD-10-CM

## 2020-07-09 DIAGNOSIS — I63231 Cerebral infarction due to unspecified occlusion or stenosis of right carotid arteries: Secondary | ICD-10-CM

## 2020-07-09 DIAGNOSIS — D751 Secondary polycythemia: Secondary | ICD-10-CM | POA: Diagnosis not present

## 2020-07-09 DIAGNOSIS — E1122 Type 2 diabetes mellitus with diabetic chronic kidney disease: Secondary | ICD-10-CM | POA: Diagnosis not present

## 2020-07-09 DIAGNOSIS — C649 Malignant neoplasm of unspecified kidney, except renal pelvis: Secondary | ICD-10-CM | POA: Diagnosis not present

## 2020-07-09 DIAGNOSIS — E119 Type 2 diabetes mellitus without complications: Secondary | ICD-10-CM | POA: Diagnosis not present

## 2020-07-09 DIAGNOSIS — I480 Paroxysmal atrial fibrillation: Secondary | ICD-10-CM | POA: Diagnosis not present

## 2020-07-09 LAB — POCT INR: INR: 2.4 (ref 2.0–3.0)

## 2020-07-09 NOTE — Telephone Encounter (Signed)
-----   Message from Freada Bergeron, MD sent at 07/09/2020  1:13 PM EDT ----- Carotids show only mild disease. He should continue ASA and zetia. We will continue to monitor them going forward with next screen in about 2-3 years.  ----- Message ----- From: Nuala Alpha, LPN Sent: 4/62/8638   8:10 PM EDT To: Freada Bergeron, MD  Former Dr. [name] pt.  He will see you on 6/2.  This is his follow-up carotids.  Please advise.  Thanks, Karlene Einstein  ----- Message ----- From: Interface, [employer] One Seven Sent: 07/04/2020  11:30 AM EDT To: Dorothy Spark, MD

## 2020-07-09 NOTE — Patient Instructions (Signed)
Continue 1.5 tablets daily except 1 tablet each Monday, Wednesday and Friday Repeat INR in 6 weeks

## 2020-07-09 NOTE — Telephone Encounter (Signed)
Nuala Alpha, LPN  0/16/0109 3:23 PM EDT Back to Top     The patient has been notified of the result and verbalized understanding. All questions (if any) were answered. Pt and wife instructed to continue ASA and Zetia per Dr. Johney Frame. Pt is not on zetia at this time, he is on Lovaza. Advised him to continue this regimen and I will route this message to Dr. Johney Frame as an Juluis Rainier, about the pt not being on zetia and on lovaza instead.  Informed the pts wife that I will follow-up with further recommendations as needed.  Informed them both that he will not need another carotid done until 2-3 years from now. We will schedule at a later time.  Both verbalized understanding and agrees with this plan.

## 2020-07-11 ENCOUNTER — Ambulatory Visit (INDEPENDENT_AMBULATORY_CARE_PROVIDER_SITE_OTHER): Payer: PPO

## 2020-07-11 DIAGNOSIS — I442 Atrioventricular block, complete: Secondary | ICD-10-CM

## 2020-07-11 LAB — CUP PACEART REMOTE DEVICE CHECK
Battery Impedance: 328 Ohm
Battery Remaining Longevity: 109 mo
Battery Voltage: 2.78 V
Brady Statistic AP VP Percent: 0 %
Brady Statistic AP VS Percent: 51 %
Brady Statistic AS VP Percent: 0 %
Brady Statistic AS VS Percent: 49 %
Date Time Interrogation Session: 20220421084756
Implantable Lead Implant Date: 20160301
Implantable Lead Implant Date: 20160301
Implantable Lead Location: 753859
Implantable Lead Location: 753860
Implantable Lead Model: 5076
Implantable Lead Model: 5076
Implantable Pulse Generator Implant Date: 20160301
Lead Channel Impedance Value: 455 Ohm
Lead Channel Impedance Value: 680 Ohm
Lead Channel Pacing Threshold Amplitude: 0.625 V
Lead Channel Pacing Threshold Amplitude: 0.625 V
Lead Channel Pacing Threshold Pulse Width: 0.4 ms
Lead Channel Pacing Threshold Pulse Width: 0.4 ms
Lead Channel Setting Pacing Amplitude: 2 V
Lead Channel Setting Pacing Amplitude: 2.5 V
Lead Channel Setting Pacing Pulse Width: 0.4 ms
Lead Channel Setting Sensing Sensitivity: 5.6 mV

## 2020-07-18 DIAGNOSIS — I480 Paroxysmal atrial fibrillation: Secondary | ICD-10-CM | POA: Diagnosis not present

## 2020-07-18 DIAGNOSIS — J209 Acute bronchitis, unspecified: Secondary | ICD-10-CM | POA: Diagnosis not present

## 2020-07-18 DIAGNOSIS — N1831 Chronic kidney disease, stage 3a: Secondary | ICD-10-CM | POA: Diagnosis not present

## 2020-07-18 DIAGNOSIS — E1122 Type 2 diabetes mellitus with diabetic chronic kidney disease: Secondary | ICD-10-CM | POA: Diagnosis not present

## 2020-07-18 DIAGNOSIS — K219 Gastro-esophageal reflux disease without esophagitis: Secondary | ICD-10-CM | POA: Diagnosis not present

## 2020-07-18 DIAGNOSIS — F321 Major depressive disorder, single episode, moderate: Secondary | ICD-10-CM | POA: Diagnosis not present

## 2020-07-18 DIAGNOSIS — Z950 Presence of cardiac pacemaker: Secondary | ICD-10-CM | POA: Diagnosis not present

## 2020-07-18 DIAGNOSIS — H903 Sensorineural hearing loss, bilateral: Secondary | ICD-10-CM | POA: Diagnosis not present

## 2020-07-18 DIAGNOSIS — E039 Hypothyroidism, unspecified: Secondary | ICD-10-CM | POA: Diagnosis not present

## 2020-07-18 DIAGNOSIS — N400 Enlarged prostate without lower urinary tract symptoms: Secondary | ICD-10-CM | POA: Diagnosis not present

## 2020-07-18 DIAGNOSIS — J440 Chronic obstructive pulmonary disease with acute lower respiratory infection: Secondary | ICD-10-CM | POA: Diagnosis not present

## 2020-07-18 DIAGNOSIS — I129 Hypertensive chronic kidney disease with stage 1 through stage 4 chronic kidney disease, or unspecified chronic kidney disease: Secondary | ICD-10-CM | POA: Diagnosis not present

## 2020-07-18 DIAGNOSIS — Z0000 Encounter for general adult medical examination without abnormal findings: Secondary | ICD-10-CM | POA: Diagnosis not present

## 2020-07-29 NOTE — Progress Notes (Signed)
Remote pacemaker transmission.   

## 2020-08-08 DIAGNOSIS — H43822 Vitreomacular adhesion, left eye: Secondary | ICD-10-CM | POA: Diagnosis not present

## 2020-08-08 DIAGNOSIS — H52222 Regular astigmatism, left eye: Secondary | ICD-10-CM | POA: Diagnosis not present

## 2020-08-08 DIAGNOSIS — H524 Presbyopia: Secondary | ICD-10-CM | POA: Diagnosis not present

## 2020-08-08 DIAGNOSIS — H43811 Vitreous degeneration, right eye: Secondary | ICD-10-CM | POA: Diagnosis not present

## 2020-08-08 DIAGNOSIS — H52221 Regular astigmatism, right eye: Secondary | ICD-10-CM | POA: Diagnosis not present

## 2020-08-08 DIAGNOSIS — H5211 Myopia, right eye: Secondary | ICD-10-CM | POA: Diagnosis not present

## 2020-08-08 DIAGNOSIS — H2512 Age-related nuclear cataract, left eye: Secondary | ICD-10-CM | POA: Diagnosis not present

## 2020-08-08 DIAGNOSIS — H35371 Puckering of macula, right eye: Secondary | ICD-10-CM | POA: Diagnosis not present

## 2020-08-13 ENCOUNTER — Ambulatory Visit: Payer: PPO | Admitting: Cardiology

## 2020-08-18 NOTE — Progress Notes (Deleted)
Cardiology Office Note:    Date:  08/18/2020   ID:  Kevin English, DOB 29-Jul-1955, MRN [id number]  PCP:  Gweneth Fritter, [place] [facility] Cardiologist:  Ena Dawley, MD (Inactive) {   Referring MD: Raina Mina., MD     History of Present Illness:    Kevin English is a 65 y.o. male with a hx of HTN, HLD intolerant to statin, prior smoker, pAfib, rheumatic heart disease with severe MS s/p MVR with  (Sorin Carbomedics Optiform bileaflet mechanical valve) and Maze Procedure with Left atrial lesion set using cryothermy and Clipping of Left Atrial Appendage with Dr. Roxy Manns in 2/16 , CHB s/p PPM placement, diastolic HF, depression, renal cell carcinoma, CKD and OSA who was previously followed by Dr. [name] who now presents for CV follow-up.  Per review of the record, the patient has history of severe MS s/p mechanical MVR with post-op course complicated by CHB. Last saw Dr. [name] on 07/18/19 where he was doing well from CV standpoint.    Past Medical History:  Diagnosis Date  . Anxiety   . Borderline diabetes   . CAD (coronary artery disease)    a. By cath 08/2013: 70% distal cx-prox LPDA; tandem mod LAD lesions, o/w mild dz.  . CKD (chronic kidney disease), stage II    his creat. runs 2-2.5; hasnt seen neph yet - sees [facility]. Tresa [facility] at [facility]  . Complete heart block (HCC)    a. s/p PPM   . Deafness in left ear   . Depression   . Diabetes mellitus without complication ([place])   . Diverticulitis of colon 15 years ago  . Hernia   . HTN (hypertension)   . Hyperlipidemia   . Hypogonadism male   . Hypothyroidism   . Lung nodule seen on imaging study 04/10/2014   5 mm nodule right lower lobe  . Mitral stenosis    a. Dx 07/2013 - through workup of 2D echo, TEE, and cath, felt to be moderate.  . Obesity   . OSA on CPAP   . Persistent atrial fibrillation ([place])    a. sustained 160-180 on e-CARDIO monitor placed 07/29/2013. Placed on IV amiodarone at end of May 2015 due to  continued paroxysms with RVR.  Marland Kitchen Renal cell carcinoma of right kidney ([place]) 09/05/2013   clear cell renal cell carcinoma of right kidney treated by radical nephrectomy, Fuhrman grade 2-3, with maximum tumor diameter 2.7 cm, all tumor to find confined to the kidney, and all surgical margins negative (T1aN0).   . Renal mass    a. Dx 08/2013: concerning for renal cancer.  . Rheumatic heart disease   . S/P Minimally invasive maze operation for atrial fibrillation 05/15/2014   Left side lesion set using cryothermy with clipping of LA appendage  . S/P minimally invasive mitral valve replacement with metallic valve and maze procedure 05/15/2014   61mm Sorin Carbomedics Optiform mechanical valve placed via right mini thoracotomy approach  . Stroke [facility]) 1998   "MRI showed 3 mild strokes"    Past Surgical History:  Procedure Laterality Date  . EYE SURGERY Right 1990   "reconstructive, lens implant- from paintball accident"  . INGUINAL HERNIA REPAIR Bilateral first one in 80's   "I had one side done twice"  . LEFT AND RIGHT HEART CATHETERIZATION WITH CORONARY ANGIOGRAM N/A 08/28/2013   Procedure: LEFT AND RIGHT HEART CATHETERIZATION WITH CORONARY ANGIOGRAM;  Surgeon: Leonie Man, MD;  Location[facility] C[facility];  Service: Cardiovascular;  Laterality: N/A;  . MINIMALLY INVASIVE MAZE PROCEDURE N/A 05/15/2014   Procedure: MINIMALLY INVASIVE MAZE PROCEDURE;  Surgeon: Rexene Alberts, MD;  Location: [place];  Service: Open Heart Surgery;  Laterality: N/A;  . MITRAL VALVE REPLACEMENT Right 05/15/2014   Procedure: MINIMALLY INVASIVE MITRAL VALVE (MV) REPLACEMENT;  Surgeon: Rexene Alberts, MD;  Location: [place];  Service: Open Heart Surgery;  Laterality: Right;  . PERMANENT PACEMAKER INSERTION Left 05/22/2014   a. MDT dual chamber PPM implanted by Dr Rayann Heman for CHB s/p MAZE/MVR  . ROBOT ASSISTED LAPAROSCOPIC NEPHRECTOMY Right 09/05/2013   Procedure: ROBOTIC ASSISTED LAPAROSCOPIC RIGHT RADICAL NEPHRECTOMY;  Surgeon:  Sharyn Creamer, MD;  [facility];  Service: Urology;  Laterality: Right;  . TEE WITHOUT CARDIOVERSION N/A 08/16/2013   Procedure: TRANSESOPHAGEAL ECHOCARDIOGRAM (TEE);  Surgeon: Dorothy Spark, MD;  Location: [place];  Service: Cardiovascular;  Laterality: N/A;  . TEE WITHOUT CARDIOVERSION N/A 05/15/2014   Procedure: TRANSESOPHAGEAL ECHOCARDIOGRAM (TEE);  Surgeon: Rexene Alberts, MD;  Location: [place];  Service: Open Heart Surgery;  Laterality: N/A;    Current Medications: No outpatient medications have been marked as taking for the 08/22/20 encounter (Appointment) with Freada Bergeron, MD.     Allergies:   Contrast media [iodinated diagnostic agents], Ioxaglate, Atorvastatin, Crestor [rosuvastatin], Fenofibrate, Pravastatin, and Pseudoephedrine hcl   Social History   Socioeconomic History  . Marital status: Married    Spouse name: Not on file  . Number of children: 2  . Years of education: 40  . Highest education level: Not on file  Occupational History  . Occupation: Retired     Comment: Warehouse manager  Tobacco Use  . Smoking status: Current Every Day Smoker    Packs/day: 0.75    Years: 50.00    Pack years: 37.50    Types: Cigarettes, Cigars  . Smokeless tobacco: Never Used  . Tobacco comment: 10-15 cigarettes daily/Cigar every now and then  Vaping Use  . Vaping Use: Never used  Substance and Sexual Activity  . Alcohol use: No    Alcohol/week: 0.0 standard drinks  . Drug use: No  . Sexual activity: Not Currently  Other Topics Concern  . Not on file  Social History Narrative   Fun: Aggravate people.    Right hand    One story home   Social Determinants of Health   Financial Resource Strain: Not on file  Food Insecurity: Not on file  Transportation Needs: Not on file  Physical Activity: Not on file  Stress: Not on file  Social Connections: Not on file     Family History: The patient's ***family history includes Diabetes in his brother; Healthy in  his daughter and son; Heart attack in his mother; Heart disease in his brother and father; Hypertension in his mother and another family member; Stroke in his mother.  ROS:   Please see the history of present illness.    *** All other systems reviewed and are negative.  EKGs/Labs/Other Studies Reviewed:    The following studies were reviewed today: Echo 7/16 Mild LVH, EF 50-55%, normal wall motion, trivial AI, mechanical MVR with normal function, mean gradient 4 mmHg, mild to moderate LAE, normal RV function  Echo 4/16 Mild LVH, EF 45-50%, apical dyskinesis, trivial AI, mechanical MVR okay, severe LAE  Carotid US 04/16/14 Bilateral - 1% to 39% ICA stensosis.  TEE 2/72/53 Normal systolic function, normal wall motion, mild AI, moderate LAE Impressions: Rheumatic mitral valve with moderate leaflet  tip thickening and minimal calcifications. Moderate mitral stenosis and trace mitral regurgitation.  TTE: 03/2018  Left ventricle: The cavity size was normal. There was moderate concentric hypertrophy. Systolic function was normal. The estimated ejection fraction was in the range of 60% to 65%. Features are consistent with a pseudonormal left ventricular filling pattern, with concomitant abnormal relaxation and increased filling pressure (grade 2 diastolic dysfunction). - Mitral valve: Transvalvular velocity was within the normal range. There was no evidence for stenosis. There was no regurgitation. - Left atrium: The atrium was moderately dilated. - Pulmonary arteries: Systolic pressure was within the normal range. PA peak pressure: 35 mm Hg (S).  Cardiac cath 08/28/13 LM: Normal LAD: Proximal 40-50%, distal 50-60% LCx: Small OM 70-80% (not amenable to PCI or CABG), PDA 70% LPDA: 10-20% RCA: Mid 30-40%  EKG:  EKG is *** ordered today.  The ekg ordered today demonstrates ***  Recent Labs: No results found for requested labs within last 8760 hours.  Recent  Lipid Panel    Component Value Date/Time   CHOL 160 04/22/2017 0852   TRIG 307 (H) 04/22/2017 0852   HDL 22 (L) 04/22/2017 0852   CHOLHDL 7.3 (H) 04/22/2017 0852   CHOLHDL 8.4 (H) 03/19/2015 0756   VLDL 54 (H) 03/19/2015 0756   LDLCALC 77 04/22/2017 0852   LDLDIRECT 62.0 12/13/2014 0845     Risk Assessment/Calculations:   {Does this patient have ATRIAL FIBRILLATION?:[phone]}   Physical Exam:    VS:  There were no vitals taken for this visit.    Wt Readings from Last 3 Encounters:  05/15/20 197 lb 9.6 oz (89.6 kg)  07/18/19 207 lb (93.9 kg)  05/16/19 210 lb 6.4 oz (95.4 kg)     GEN: *** Well nourished, well developed in no acute distress HEENT: Normal NECK: No JVD; No carotid bruits LYMPHATICS: No lymphadenopathy CARDIAC: ***RRR, no murmurs, rubs, gallops RESPIRATORY:  Clear to auscultation without rales, wheezing or rhonchi  ABDOMEN: Soft, non-tender, non-distended MUSCULOSKELETAL:  No edema; No deformity  SKIN: Warm and dry NEUROLOGIC:  Alert and oriented x 3 PSYCHIATRIC:  Normal affect   ASSESSMENT:    No diagnosis found. PLAN:    In order of problems listed above:  #Rheumatic Mitral Stenosis s/p Mechanical MVR: Patient is s/p MVR with Sorin Carbomedics Optiform bileaflet mechanical valve and Maze Procedure with Left atrial lesion set using cryothermy and Clipping of Left Atrial Appendage with Dr. Roxy Manns in 2/16 . Post-op course c/b CHB s/p PPM placement. TTE 1/202 with normal LVEF, well functioning MVR, normal gradients. -Continue warfarin -Needs lifelong IE ppx  #Paroxysmal Afib s/p MAZE and LAA clipping. Maintaining NSR. Follows in Afib clinic with Roderic Palau. On warfarin for rheumatic MS. -Continue warfarin -Continue metop 12.5mg  BID  #Chronic Diastolic HF: Euvolemic and stable on exam. Currently with NYHA class II symptoms. -Not on diuretics -BP management as below -? Spiro/farxgia -Low Na diet  #CHB s/p PPM placement: Developed following  MVR. -Follow-up with Dr. Rayann Heman as scheduled  #HLD: #Statin intolerance Controlled. -Continue zetia 10mg  daily -Continue lovaza 2gm BID  #HTN: -Continue amlodipine 10mg  daily  #Mild nonobstructive CAD: -Continue ASA 81mg  daily -Continue zetia 10mg  daily -Continue lovaza 2gm BID -Healthy diet and exercise  #OSA: -Compliant with CPAP   #Tobacco use: -Encourage cessation  {Are you ordering a CV Procedure (e.g. stress test, cath, DCCV, TEE, etc)?   Press F2        :[id number]}    Medication Adjustments/Labs and Tests Ordered: Current medicines are  reviewed at length with the patient today.  Concerns regarding medicines are outlined above.  No orders of the defined types were placed in this encounter.  No orders of the defined types were placed in this encounter.   There are no Patient Instructions on file for this visit.   Signed, Freada Bergeron, MD  08/18/2020 3:14 PM    [facility]

## 2020-08-20 ENCOUNTER — Ambulatory Visit (INDEPENDENT_AMBULATORY_CARE_PROVIDER_SITE_OTHER): Payer: PPO

## 2020-08-20 ENCOUNTER — Other Ambulatory Visit: Payer: Self-pay

## 2020-08-20 DIAGNOSIS — Z0000 Encounter for general adult medical examination without abnormal findings: Secondary | ICD-10-CM | POA: Diagnosis not present

## 2020-08-20 DIAGNOSIS — I5033 Acute on chronic diastolic (congestive) heart failure: Secondary | ICD-10-CM

## 2020-08-20 DIAGNOSIS — Z5181 Encounter for therapeutic drug level monitoring: Secondary | ICD-10-CM

## 2020-08-20 DIAGNOSIS — I480 Paroxysmal atrial fibrillation: Secondary | ICD-10-CM | POA: Diagnosis not present

## 2020-08-20 DIAGNOSIS — Z954 Presence of other heart-valve replacement: Secondary | ICD-10-CM

## 2020-08-20 DIAGNOSIS — I4891 Unspecified atrial fibrillation: Secondary | ICD-10-CM

## 2020-08-20 LAB — POCT INR: INR: 2.2 (ref 2.0–3.0)

## 2020-08-20 NOTE — Patient Instructions (Signed)
Take 2 tablets tonight only and then Continue 1.5 tablets daily except 1 tablet each Monday, Wednesday and Friday Repeat INR in 6 weeks

## 2020-08-21 ENCOUNTER — Ambulatory Visit: Payer: PPO | Admitting: Internal Medicine

## 2020-08-22 ENCOUNTER — Other Ambulatory Visit: Payer: Self-pay

## 2020-08-22 ENCOUNTER — Encounter: Payer: Self-pay | Admitting: Cardiology

## 2020-08-22 ENCOUNTER — Ambulatory Visit: Payer: PPO | Admitting: Cardiology

## 2020-08-22 VITALS — BP 134/76 | HR 79 | Ht 68.0 in | Wt 198.8 lb

## 2020-08-22 DIAGNOSIS — R5383 Other fatigue: Secondary | ICD-10-CM | POA: Diagnosis not present

## 2020-08-22 DIAGNOSIS — I2510 Atherosclerotic heart disease of native coronary artery without angina pectoris: Secondary | ICD-10-CM | POA: Diagnosis not present

## 2020-08-22 DIAGNOSIS — I10 Essential (primary) hypertension: Secondary | ICD-10-CM

## 2020-08-22 DIAGNOSIS — E785 Hyperlipidemia, unspecified: Secondary | ICD-10-CM | POA: Diagnosis not present

## 2020-08-22 DIAGNOSIS — I442 Atrioventricular block, complete: Secondary | ICD-10-CM

## 2020-08-22 DIAGNOSIS — F339 Major depressive disorder, recurrent, unspecified: Secondary | ICD-10-CM | POA: Diagnosis not present

## 2020-08-22 DIAGNOSIS — Z954 Presence of other heart-valve replacement: Secondary | ICD-10-CM | POA: Diagnosis not present

## 2020-08-22 DIAGNOSIS — I480 Paroxysmal atrial fibrillation: Secondary | ICD-10-CM | POA: Diagnosis not present

## 2020-08-22 DIAGNOSIS — G4733 Obstructive sleep apnea (adult) (pediatric): Secondary | ICD-10-CM

## 2020-08-22 DIAGNOSIS — R5381 Other malaise: Secondary | ICD-10-CM

## 2020-08-22 MED ORDER — DILTIAZEM HCL ER COATED BEADS 120 MG PO CP24: 120.0000 mg | ORAL_CAPSULE | Freq: Every day | ORAL | 3 refills | Status: DC

## 2020-08-22 MED ORDER — VALSARTAN 40 MG PO TABS: 40.0000 mg | ORAL_TABLET | Freq: Every day | ORAL | 3 refills | Status: DC

## 2020-08-22 NOTE — Patient Instructions (Signed)
Medication Instructions:  Stop Metoprolol tartrate  Stop amlodipine Start Diltiazem 120 mg daily at bedtime *If you need a refill on your cardiac medications before your next appointment, please call your pharmacy*   Lab Work: BMP in 1 week If you have labs (blood work) drawn today and your tests are completely normal, you will receive your results only by: Marland Kitchen MyChart Message (if you have MyChart) OR . A paper copy in the mail If you have any lab test that is abnormal or we need to change your treatment, we will call you to review the results.   Testing/Procedures: none   Follow-Up: At [facility], you and your health needs are our priority.  As part of our continuing mission to provide you with exceptional heart care, we have created designated Provider Care Teams.  These Care Teams include your primary Cardiologist (physician) and Advanced Practice Providers (APPs -  Physician Assistants and Nurse Practitioners) who all work together to provide you with the care you need, when you need it.  We recommend signing up for the patient portal called "MyChart".  Sign up information is provided on this After Visit Summary.  MyChart is used to connect with patients for Virtual Visits (Telemedicine).  Patients are able to view lab/test results, encounter notes, upcoming appointments, etc.  Non-urgent messages can be sent to your provider as well.   To learn more about what you can do with MyChart, go to [email or web address].    Your next appointment:   2 month(s)  The format for your next appointment:   In Person  Provider:   Gwyndolyn Kaufman, MD   Other Instructions none

## 2020-08-22 NOTE — Progress Notes (Signed)
Cardiology Office Note:    Date:  08/22/2020   ID:  [name], DOB 1955/10/14, MRN [id number]  PCP:  Gweneth Fritter, [place] Providers Cardiologist:  Ena Dawley, MD (Inactive) {   Referring MD: Raina Mina., MD   CC: Follow-up  History of Present Illness:    Kevin English is a 65 y.o. male with a hx of HTN, HLD intolerant to statin, prior smoker, pAfib, rheumatic heart disease with severe MS s/p MVR with  (Sorin Carbomedics Optiform bileaflet mechanical valve) and Maze Procedure with Left atrial lesion set using cryothermy and Clipping of Left Atrial Appendage with Dr. Roxy Manns in 2/16 , CHB s/p PPM placement, diastolic HF, depression, renal cell carcinoma, CKD and OSA who was previously followed by Dr. [name] who now presents for CV follow-up.  Per review of the record, the patient has history of severe MS s/p mechanical MVR with post-op course complicated by CHB. Last saw Dr. [name] on 07/18/19 where he was doing well from CV standpoint.  Today, he is accompanied by his wife, who also provides some history. Lately, he has not been feeling well. He is constantly fatigued, which has not improved since his valve replacement. Also, he is feeling depressed with losing his ability to do what he could before. He is unsure if his fatigue and depression may be caused by taking metoprolol. He has had a significant work-up for chronic fatigue which has been unremarkable. Also has been trialed on multiple antidepressants with significant side effects. Now on sertraline. Denies any chest pain, SOB, orthopnea, PND, or LE edema. Tolerating warfarin without issues. No recurrent Afib since his surgery that he is aware of.    Past Medical History:  Diagnosis Date  . Anxiety   . Borderline diabetes   . CAD (coronary artery disease)    a. By cath 08/2013: 70% distal cx-prox LPDA; tandem mod LAD lesions, o/w mild dz.  . CKD (chronic kidney disease), stage II    his creat. runs 2-2.5;  hasnt seen neph yet - sees dr. Tresa Moore at [facility]  . Complete heart block (HCC)    a. s/p PPM   . Deafness in left ear   . Depression   . Diabetes mellitus without complication ([place])   . Diverticulitis of colon 15 years ago  . Hernia   . HTN (hypertension)   . Hyperlipidemia   . Hypogonadism male   . Hypothyroidism   . Lung nodule seen on imaging study 04/10/2014   5 mm nodule right lower lobe  . Mitral stenosis    a. Dx 07/2013 - through workup of 2D echo, TEE, and cath, felt to be moderate.  . Obesity   . OSA on CPAP   . Persistent atrial fibrillation ([place])    a. sustained 160-180 on e-CARDIO monitor placed 07/29/2013. Placed on IV amiodarone at end of May 2015 due to continued paroxysms with RVR.  Marland Kitchen Renal cell carcinoma of right kidney ([place]) 09/05/2013   clear cell renal cell carcinoma of right kidney treated by radical nephrectomy, Fuhrman grade 2-3, with maximum tumor diameter 2.7 cm, all tumor to find confined to the kidney, and all surgical margins negative (T1aN0).   . Renal mass    a. Dx 08/2013: concerning for renal cancer.  . Rheumatic heart disease   . S/P Minimally invasive maze operation for atrial fibrillation 05/15/2014   Left side lesion set using cryothermy with clipping of LA appendage  . S/P minimally  invasive mitral valve replacement with metallic valve and maze procedure 05/15/2014   27m Sorin Carbomedics Optiform mechanical valve placed via right mini thoracotomy approach  . Stroke ([facility] 1998   "MRI showed 3 mild strokes"    Past Surgical History:  Procedure Laterality Date  . EYE SURGERY Right 1990   "reconstructive, lens implant- from paintball accident"  . INGUINAL HERNIA REPAIR Bilateral first one in 80's   "I had one side done twice"  . LEFT AND RIGHT HEART CATHETERIZATION WITH CORONARY ANGIOGRAM N/A 08/28/2013   Procedure: LEFT AND RIGHT HEART CATHETERIZATION WITH CORONARY ANGIOGRAM;  Surgeon: DLeonie Man MD;  [facility];  Service:  Cardiovascular;  Laterality: N/A;  . MINIMALLY INVASIVE MAZE PROCEDURE N/A 05/15/2014   Procedure: MINIMALLY INVASIVE MAZE PROCEDURE;  Surgeon: CRexene Alberts MD;  [place] [facility]  Service: Open Heart Surgery;  Laterality: N/A;  . MITRAL VALVE REPLACEMENT Right 05/15/2014   Procedure: MINIMALLY INVASIVE MITRAL VALVE (MV) REPLACEMENT;  Surgeon: CRexene Alberts MD;  Location: [facility]  Service: Open Heart Surgery;  Laterality: Right;  . PERMANENT PACEMAKER INSERTION Left 05/22/2014   a. MDT dual chamber PPM implanted by Dr ARayann Hemanfor CHB s/p MAZE/MVR  . ROBOT ASSISTED LAPAROSCOPIC NEPHRECTOMY Right 09/05/2013   Procedure: ROBOTIC ASSISTED LAPAROSCOPIC RIGHT RADICAL NEPHRECTOMY;  Surgeon: DSharyn Creamer MD;  Location[facility];  Service: Urology;  Laterality: Right;  . TEE WITHOUT CARDIOVERSION N/A 08/16/2013   Procedure: TRANSESOPHAGEAL ECHOCARDIOGRAM (TEE);  Surgeon: KDorothy Spark MD;  [place]  Service: Cardiovascular;  Laterality: N/A;  . TEE WITHOUT CARDIOVERSION N/A 05/15/2014   Procedure: TRANSESOPHAGEAL ECHOCARDIOGRAM (TEE);  Surgeon: CRexene Alberts MD;  [place] [facility] [place]  Service: Open Heart Surgery;  Laterality: N/A;    Current Medications: Current Meds  Medication Sig  . acetaminophen (TYLENOL) 325 MG tablet Take 325 mg by mouth every 6 (six) hours as needed (pain).   .Marland Kitchenalfuzosin (UROXATRAL) 10 MG 24 hr tablet Take 10 mg by mouth daily with breakfast.  . aspirin EC 81 MG tablet Take 1 tablet (81 mg total) by mouth daily.  . Blood Glucose Monitoring Suppl (ONE TOUCH ULTRA MINI) w/Device KIT Use meter to check blood sugars 1-4 times daily as instructed.  . Continuous Blood Gluc Receiver (FREESTYLE LIBRE 2 READER) DEVI SCAN AS NEEDED FOR CONTINUOUS GLUCOSE MONITORING.  . Continuous Blood Gluc Sensor (FREESTYLE LIBRE 2 SENSOR) MISC USE AS DIRECTED EVERY 14 DAYS  . diltiazem (CARDIZEM CD) 120 MG 24 hr capsule Take 1 capsule (120 mg total) by mouth at bedtime.  .Marland Kitchenglucose  blood (ONE TOUCH ULTRA TEST) test strip Use one strip per test. Test blood sugars 1-4 times daily as instructed.  . Lancets (ONETOUCH DELICA PLUS [id number] MISC USE 1 LANCET PER TEST. TEST BLOOD SUGARS 1 4 TIMES PER DAY AS INSTRUCTED.  .Marland Kitchenlevothyroxine (SYNTHROID) 75 MCG tablet TAKE 1 TABLET (75 MCG TOTAL) BY MOUTH DAILY BEFORE BREAKFAST.  .Marland Kitchenloratadine (CLARITIN) 10 MG tablet Take 1 tablet (10 mg total) by mouth daily.  .Marland KitchenMELATONIN GUMMIES PO Take by mouth. Pt states taking 2 tablets 20 mg at night but making him more sleepy so taking 1 at night  . metFORMIN (GLUCOPHAGE) 500 MG tablet Take 1 tablet by mouth daily with breakfast.  . omega-3 acid ethyl esters (LOVAZA) 1 g capsule TAKE 2 CAPSULES (2 G TOTAL) BY MOUTH 2 (TWO) TIMES DAILY.  .Glory RosebushDelica Lancets 344WMISC USE TO TEST BLOOD SUGARS  1-4 TIMES PER DAY AS INSTRUCTED  . sertraline (ZOLOFT) 50 MG tablet Take 50 mg by mouth daily.  . valsartan (DIOVAN) 40 MG tablet Take 1 tablet (40 mg total) by mouth daily.  Marland Kitchen warfarin (COUMADIN) 5 MG tablet TAKE 1 TO 1.5 TABLETS DAILY AS DIRECTED BY ANTICOAGULATION CLINIC  . [DISCONTINUED] amLODipine (NORVASC) 10 MG tablet TAKE 1 TABLET (10 MG TOTAL) BY MOUTH DAILY.  . [DISCONTINUED] metoprolol tartrate (LOPRESSOR) 25 MG tablet TAKE 0.5 TABLETS BY MOUTH 2 TIMES DAILY.     Allergies:   Contrast media [iodinated diagnostic agents], Ioxaglate, Atorvastatin, Crestor [rosuvastatin], Fenofibrate, Pravastatin, and Pseudoephedrine hcl   Social History   Socioeconomic History  . Marital status: Married    Spouse name: Not on file  . Number of children: 2  . Years of education: 49  . Highest education level: Not on file  Occupational History  . Occupation: Retired     Comment: Warehouse manager  Tobacco Use  . Smoking status: Current Every Day Smoker    Packs/day: 0.75    Years: 50.00    Pack years: 37.50    Types: Cigarettes, Cigars  . Smokeless tobacco: Never Used  . Tobacco comment: 10-15 cigarettes  daily/Cigar every now and then  Vaping Use  . Vaping Use: Never used  Substance and Sexual Activity  . Alcohol use: No    Alcohol/week: 0.0 standard drinks  . Drug use: No  . Sexual activity: Not Currently  Other Topics Concern  . Not on file  Social History Narrative   Fun: Aggravate people.    Right hand    One story home   Social Determinants of Health   Financial Resource Strain: Not on file  Food Insecurity: Not on file  Transportation Needs: Not on file  Physical Activity: Not on file  Stress: Not on file  Social Connections: Not on file     Family History: The patient's family history includes Diabetes in his brother; Healthy in his daughter and son; Heart attack in his mother; Heart disease in his brother and father; Hypertension in his mother and another family member; Stroke in his mother.  ROS:   Review of Systems  Constitutional: Positive for malaise/fatigue. Negative for weight loss.  HENT: Negative for hearing loss and nosebleeds.   Eyes: Negative for photophobia and redness.  Respiratory: Negative for cough and wheezing.   Cardiovascular: Negative for chest pain, palpitations, orthopnea, claudication, leg swelling and PND.  Gastrointestinal: Negative for constipation, diarrhea and heartburn.  Genitourinary: Negative for flank pain and urgency.  Musculoskeletal: Negative for back pain and joint pain.  Neurological: Negative for dizziness and speech change.  Endo/Heme/Allergies: Negative for polydipsia.  Psychiatric/Behavioral: Positive for depression. Negative for hallucinations.     EKGs/Labs/Other Studies Reviewed:    The following studies were reviewed today: Echo 7/16 Mild LVH, EF 50-55%, normal wall motion, trivial AI, mechanical MVR with normal function, mean gradient 4 mmHg, mild to moderate LAE, normal RV function  Echo 4/16 Mild LVH, EF 45-50%, apical dyskinesis, trivial AI, mechanical MVR okay, severe LAE  Carotid US 04/16/14 Bilateral -  1% to 39% ICA stensosis.  TEE 5/36/64 Normal systolic function, normal wall motion, mild AI, moderate LAE Impressions: Rheumatic mitral valve with moderate leaflet tip thickening and minimal calcifications. Moderate mitral stenosis and trace mitral regurgitation.  TTE: 03/2018  Left ventricle: The cavity size was normal. There was moderate concentric hypertrophy. Systolic function was normal. The estimated ejection fraction was in the range of 60% to  65%. Features are consistent with a pseudonormal left ventricular filling pattern, with concomitant abnormal relaxation and increased filling pressure (grade 2 diastolic dysfunction). - Mitral valve: Transvalvular velocity was within the normal range. There was no evidence for stenosis. There was no regurgitation. - Left atrium: The atrium was moderately dilated. - Pulmonary arteries: Systolic pressure was within the normal range. PA peak pressure: 35 mm Hg (S).  Cardiac cath 08/28/13 LM: Normal LAD: Proximal 40-50%, distal 50-60% LCx: Small OM 70-80% (not amenable to PCI or CABG), PDA 70% LPDA: 10-20% RCA: Mid 30-40%  EKG:   08/22/2020: NSR, Rate 79 bpm, first degree AVB, RBBB  Recent Labs: No results found for requested labs within last 8760 hours.  Recent Lipid Panel    Component Value Date/Time   CHOL 160 04/22/2017 0852   TRIG 307 (H) 04/22/2017 0852   HDL 22 (L) 04/22/2017 0852   CHOLHDL 7.3 (H) 04/22/2017 0852   CHOLHDL 8.4 (H) 03/19/2015 0756   VLDL 54 (H) 03/19/2015 0756   LDLCALC 77 04/22/2017 0852   LDLDIRECT 62.0 12/13/2014 0845     Risk Assessment/Calculations:    CHA2DS2-VASc Score = 4  This indicates a 4.8% annual risk of stroke. The patient's score is based upon: CHF History: Yes HTN History: Yes Diabetes History: No Stroke History: No Vascular Disease History: Yes Age Score: 1 Gender Score: 0    Physical Exam:    VS:  BP 134/76   Pulse 79   Ht 5' 8"  (1.727 m)   Wt 198 lb  12.8 oz (90.2 kg)   SpO2 98%   BMI 30.23 kg/m     Wt Readings from Last 3 Encounters:  08/22/20 198 lb 12.8 oz (90.2 kg)  05/15/20 197 lb 9.6 oz (89.6 kg)  07/18/19 207 lb (93.9 kg)     GEN: Well nourished, well developed in no acute distress, congenitally hard of hearing HEENT: Normal NECK: No JVD; No carotid bruits CARDIAC: RRR, crisp mechanical click, no rubs or gallops RESPIRATORY:  Clear to auscultation without rales, wheezing or rhonchi  ABDOMEN: Soft, non-tender, non-distended MUSCULOSKELETAL:  No edema; No deformity  SKIN: Warm and dry NEUROLOGIC:  Alert and oriented x 3 PSYCHIATRIC:  Depressed affect  ASSESSMENT:    1. S/P mitral valve replacement with metallic valve   2. Paroxysmal atrial fibrillation   3. Hyperlipidemia, unspecified hyperlipidemia type   4. Essential hypertension   5. Coronary artery disease involving native coronary artery of native heart without angina pectoris   6. OSA (obstructive sleep apnea)   7. CHB (complete heart block) (HCC)   8. Episode of recurrent major depressive disorder, unspecified depression episode severity ([place])   9. Malaise and fatigue    PLAN:    In order of problems listed above:  #Rheumatic Mitral Stenosis s/p Mechanical MVR: Patient is s/p MVR with Sorin Carbomedics Optiform bileaflet mechanical valve and Maze Procedure with Left atrial lesion set using cryothermy and Clipping of Left Atrial Appendage with Dr. Roxy Manns in 2/16 . Post-op course c/b CHB s/p PPM placement. TTE 03/2018 with normal LVEF, well functioning MVR, normal gradients. -Continue warfarin -Surveillance TTE in 1-2 years  -Needs lifelong IE ppx  #Paroxysmal Afib s/p MAZE and LAA clipping: Maintaining NSR. Follows in Afib clinic with Roderic Palau. On warfarin for rheumatic MS. -Continue warfarin -Will trial off metop and on dilt to see if helps with fatigue/depression  #Chronic Diastolic HF: Euvolemic and stable on exam. Currently with NYHA class I-II  symptoms. -Not on diuretics -  BP management as below -Low Na diet  #CHB s/p PPM placement: Developed following MVR. -Follow-up with Dr. Rayann Heman as scheduled  #HLD: #Statin intolerance Controlled with LDL 77 in 2019 -Continue zetia 32m daily -Continue lovaza 2gm BID -Repeat lipids at next visit  #HTN: Controlled at home. Given that we want to trial off of metop due to persistent fatigue/depression, will change amlodipine to valsartan and start dilt for Afib. -Start dilt 1263mXL daily -Start valsartan 4062maily -BMET next week -Stop amlo while on dilt  #Mild nonobstructive CAD: -Continue ASA 23m34mily -Continue zetia 10mg66mly -Continue lovaza 2gm BID -Healthy diet and exercise  #OSA: -Compliant with CPAP   #Depression: #Chronic Fatigue: Patient has had significant depression and chronic fatigue since his surgery. Has had extensive work-up which has been unrevealing. Did not tolerate many of the antidepressants. We discussed his medications at length today and we can see if trialing off metop helps at all with symptoms and change him to dilt in the interim. This likely will not make a huge difference, but it is worth trying given the pervasiveness of his fatigue. -Trial off metop -Start dilt and d/c amlo as above    Follow-up in 2-3 months.  Medication Adjustments/Labs and Tests Ordered: Current medicines are reviewed at length with the patient today.  Concerns regarding medicines are outlined above.  Orders Placed This Encounter  Procedures  . Basic Metabolic Panel (BMET)  . EKG 12-Lead   Meds ordered this encounter  Medications  . diltiazem (CARDIZEM CD) 120 MG 24 hr capsule    Sig: Take 1 capsule (120 mg total) by mouth at bedtime.    Dispense:  90 capsule    Refill:  3  . valsartan (DIOVAN) 40 MG tablet    Sig: Take 1 tablet (40 mg total) by mouth daily.    Dispense:  90 tablet    Refill:  3    Patient Instructions  Medication Instructions:  Stop  Metoprolol tartrate  Stop amlodipine Start Diltiazem 120 mg daily at bedtime *If you need a refill on your cardiac medications before your next appointment, please call your pharmacy*   Lab Work: BMP in 1 week If you have labs (blood work) drawn today and your tests are completely normal, you will receive your results only by: . MyCMarland Kitchenart Message (if you have MyChart) OR . A paper copy in the mail If you have any lab test that is abnormal or we need to change your treatment, we will call you to review the results.   Testing/Procedures: none   Follow-Up: At [facility] and your health needs are our priority.  As part of our continuing mission to provide you with exceptional heart care, we have created designated Provider Care Teams.  These Care Teams include your primary Cardiologist (physician) and Advanced Practice Providers (APPs -  Physician Assistants and Nurse Practitioners) who all work together to provide you with the care you need, when you need it.  We recommend signing up for the patient portal called "MyChart".  Sign up information is provided on this After Visit Summary.  MyChart is used to connect with patients for Virtual Visits (Telemedicine).  Patients are able to view lab/test results, encounter notes, upcoming appointments, etc.  Non-urgent messages can be sent to your provider as well.   To learn more about what you can do with MyChart, go to [email or web address] next appointment:   2 month(s)  The format for your next  appointment:   In Person  Provider:   Gwyndolyn Kaufman, MD   Other Instructions none    I,Mathew Stumpf,acting as a scribe for Freada Bergeron, MD.,have documented all relevant documentation on the behalf of Freada Bergeron, MD,as directed by  Freada Bergeron, MD while in the presence of Freada Bergeron, MD.  I, Freada Bergeron, MD, have reviewed all documentation for this visit. The documentation on  08/22/20 for the exam, diagnosis, procedures, and orders are all accurate and complete.  Signed, Freada Bergeron, MD  08/22/2020 5:20 PM    [place]

## 2020-08-29 DIAGNOSIS — I2510 Atherosclerotic heart disease of native coronary artery without angina pectoris: Secondary | ICD-10-CM | POA: Diagnosis not present

## 2020-08-29 DIAGNOSIS — E785 Hyperlipidemia, unspecified: Secondary | ICD-10-CM | POA: Diagnosis not present

## 2020-08-29 DIAGNOSIS — I480 Paroxysmal atrial fibrillation: Secondary | ICD-10-CM | POA: Diagnosis not present

## 2020-08-29 DIAGNOSIS — I10 Essential (primary) hypertension: Secondary | ICD-10-CM | POA: Diagnosis not present

## 2020-08-29 DIAGNOSIS — Z954 Presence of other heart-valve replacement: Secondary | ICD-10-CM | POA: Diagnosis not present

## 2020-08-29 DIAGNOSIS — G4733 Obstructive sleep apnea (adult) (pediatric): Secondary | ICD-10-CM | POA: Diagnosis not present

## 2020-08-30 LAB — BASIC METABOLIC PANEL
BUN/Creatinine Ratio: 11 (ref 10–24)
BUN: 18 mg/dL (ref 8–27)
CO2: 19 mmol/L — ABNORMAL LOW (ref 20–29)
Calcium: 10.4 mg/dL — ABNORMAL HIGH (ref 8.6–10.2)
Chloride: 101 mmol/L (ref 96–106)
Creatinine, Ser: 1.63 mg/dL — ABNORMAL HIGH (ref 0.76–1.27)
Glucose: 187 mg/dL — ABNORMAL HIGH (ref 65–99)
Potassium: 5 mmol/L (ref 3.5–5.2)
Sodium: 136 mmol/L (ref 134–144)
eGFR: 46 mL/min/{1.73_m2} — ABNORMAL LOW (ref >59)

## 2020-09-27 ENCOUNTER — Emergency Department (HOSPITAL_COMMUNITY): Payer: PPO

## 2020-09-27 ENCOUNTER — Inpatient Hospital Stay (HOSPITAL_COMMUNITY)
Admission: EM | Admit: 2020-09-27 | Discharge: 2020-09-29 | DRG: 689 | Disposition: A | Discharge status: Home / Self Care | Payer: PPO | Attending: Internal Medicine | Admitting: Internal Medicine

## 2020-09-27 ENCOUNTER — Other Ambulatory Visit: Payer: Self-pay

## 2020-09-27 ENCOUNTER — Encounter (HOSPITAL_COMMUNITY): Payer: Self-pay

## 2020-09-27 DIAGNOSIS — G9340 Encephalopathy, unspecified: Secondary | ICD-10-CM | POA: Diagnosis present

## 2020-09-27 DIAGNOSIS — R3911 Hesitancy of micturition: Secondary | ICD-10-CM | POA: Diagnosis not present

## 2020-09-27 DIAGNOSIS — R531 Weakness: Secondary | ICD-10-CM | POA: Diagnosis present

## 2020-09-27 DIAGNOSIS — E1122 Type 2 diabetes mellitus with diabetic chronic kidney disease: Secondary | ICD-10-CM | POA: Diagnosis present

## 2020-09-27 DIAGNOSIS — N401 Enlarged prostate with lower urinary tract symptoms: Secondary | ICD-10-CM | POA: Diagnosis present

## 2020-09-27 DIAGNOSIS — Z79890 Hormone replacement therapy (postmenopausal): Secondary | ICD-10-CM

## 2020-09-27 DIAGNOSIS — Z7984 Long term (current) use of oral hypoglycemic drugs: Secondary | ICD-10-CM

## 2020-09-27 DIAGNOSIS — Z952 Presence of prosthetic heart valve: Secondary | ICD-10-CM

## 2020-09-27 DIAGNOSIS — I129 Hypertensive chronic kidney disease with stage 1 through stage 4 chronic kidney disease, or unspecified chronic kidney disease: Secondary | ICD-10-CM | POA: Diagnosis not present

## 2020-09-27 DIAGNOSIS — F411 Generalized anxiety disorder: Secondary | ICD-10-CM | POA: Diagnosis not present

## 2020-09-27 DIAGNOSIS — I442 Atrioventricular block, complete: Secondary | ICD-10-CM | POA: Diagnosis not present

## 2020-09-27 DIAGNOSIS — N390 Urinary tract infection, site not specified: Secondary | ICD-10-CM | POA: Diagnosis not present

## 2020-09-27 DIAGNOSIS — Z9861 Coronary angioplasty status: Secondary | ICD-10-CM

## 2020-09-27 DIAGNOSIS — G9341 Metabolic encephalopathy: Secondary | ICD-10-CM | POA: Diagnosis not present

## 2020-09-27 DIAGNOSIS — R8271 Bacteriuria: Secondary | ICD-10-CM | POA: Diagnosis not present

## 2020-09-27 DIAGNOSIS — D72829 Elevated white blood cell count, unspecified: Secondary | ICD-10-CM | POA: Diagnosis not present

## 2020-09-27 DIAGNOSIS — N1831 Chronic kidney disease, stage 3a: Secondary | ICD-10-CM

## 2020-09-27 DIAGNOSIS — Z905 Acquired absence of kidney: Secondary | ICD-10-CM

## 2020-09-27 DIAGNOSIS — R310 Gross hematuria: Secondary | ICD-10-CM | POA: Diagnosis present

## 2020-09-27 DIAGNOSIS — Z7901 Long term (current) use of anticoagulants: Secondary | ICD-10-CM | POA: Diagnosis not present

## 2020-09-27 DIAGNOSIS — Z91041 Radiographic dye allergy status: Secondary | ICD-10-CM

## 2020-09-27 DIAGNOSIS — E785 Hyperlipidemia, unspecified: Secondary | ICD-10-CM | POA: Diagnosis not present

## 2020-09-27 DIAGNOSIS — Z950 Presence of cardiac pacemaker: Secondary | ICD-10-CM | POA: Diagnosis not present

## 2020-09-27 DIAGNOSIS — Z7982 Long term (current) use of aspirin: Secondary | ICD-10-CM

## 2020-09-27 DIAGNOSIS — I4819 Other persistent atrial fibrillation: Secondary | ICD-10-CM | POA: Diagnosis present

## 2020-09-27 DIAGNOSIS — Z720 Tobacco use: Secondary | ICD-10-CM

## 2020-09-27 DIAGNOSIS — R42 Dizziness and giddiness: Secondary | ICD-10-CM | POA: Diagnosis not present

## 2020-09-27 DIAGNOSIS — F32A Depression, unspecified: Secondary | ICD-10-CM | POA: Diagnosis not present

## 2020-09-27 DIAGNOSIS — Z8249 Family history of ischemic heart disease and other diseases of the circulatory system: Secondary | ICD-10-CM

## 2020-09-27 DIAGNOSIS — Z833 Family history of diabetes mellitus: Secondary | ICD-10-CM

## 2020-09-27 DIAGNOSIS — I10 Essential (primary) hypertension: Secondary | ICD-10-CM | POA: Diagnosis not present

## 2020-09-27 DIAGNOSIS — I4891 Unspecified atrial fibrillation: Secondary | ICD-10-CM | POA: Diagnosis not present

## 2020-09-27 DIAGNOSIS — R269 Unspecified abnormalities of gait and mobility: Secondary | ICD-10-CM

## 2020-09-27 DIAGNOSIS — I4821 Permanent atrial fibrillation: Secondary | ICD-10-CM | POA: Diagnosis not present

## 2020-09-27 DIAGNOSIS — Z8673 Personal history of transient ischemic attack (TIA), and cerebral infarction without residual deficits: Secondary | ICD-10-CM

## 2020-09-27 DIAGNOSIS — Z823 Family history of stroke: Secondary | ICD-10-CM

## 2020-09-27 DIAGNOSIS — Z85528 Personal history of other malignant neoplasm of kidney: Secondary | ICD-10-CM

## 2020-09-27 DIAGNOSIS — R5381 Other malaise: Secondary | ICD-10-CM | POA: Diagnosis present

## 2020-09-27 DIAGNOSIS — N183 Chronic kidney disease, stage 3 unspecified: Secondary | ICD-10-CM | POA: Diagnosis not present

## 2020-09-27 DIAGNOSIS — Z20822 Contact with and (suspected) exposure to covid-19: Secondary | ICD-10-CM | POA: Diagnosis present

## 2020-09-27 DIAGNOSIS — F17210 Nicotine dependence, cigarettes, uncomplicated: Secondary | ICD-10-CM | POA: Diagnosis present

## 2020-09-27 DIAGNOSIS — E039 Hypothyroidism, unspecified: Secondary | ICD-10-CM | POA: Diagnosis not present

## 2020-09-27 DIAGNOSIS — I2510 Atherosclerotic heart disease of native coronary artery without angina pectoris: Secondary | ICD-10-CM | POA: Diagnosis not present

## 2020-09-27 DIAGNOSIS — R319 Hematuria, unspecified: Secondary | ICD-10-CM

## 2020-09-27 DIAGNOSIS — D751 Secondary polycythemia: Secondary | ICD-10-CM | POA: Diagnosis present

## 2020-09-27 DIAGNOSIS — E291 Testicular hypofunction: Secondary | ICD-10-CM

## 2020-09-27 DIAGNOSIS — N3001 Acute cystitis with hematuria: Secondary | ICD-10-CM | POA: Diagnosis not present

## 2020-09-27 DIAGNOSIS — I2583 Coronary atherosclerosis due to lipid rich plaque: Secondary | ICD-10-CM

## 2020-09-27 DIAGNOSIS — R0602 Shortness of breath: Secondary | ICD-10-CM | POA: Diagnosis not present

## 2020-09-27 DIAGNOSIS — Z9181 History of falling: Secondary | ICD-10-CM

## 2020-09-27 DIAGNOSIS — G4733 Obstructive sleep apnea (adult) (pediatric): Secondary | ICD-10-CM | POA: Diagnosis not present

## 2020-09-27 DIAGNOSIS — Z888 Allergy status to other drugs, medicaments and biological substances status: Secondary | ICD-10-CM

## 2020-09-27 DIAGNOSIS — N3000 Acute cystitis without hematuria: Secondary | ICD-10-CM | POA: Diagnosis not present

## 2020-09-27 DIAGNOSIS — Z79899 Other long term (current) drug therapy: Secondary | ICD-10-CM

## 2020-09-27 DIAGNOSIS — H9192 Unspecified hearing loss, left ear: Secondary | ICD-10-CM | POA: Diagnosis present

## 2020-09-27 LAB — GLUCOSE, CAPILLARY: Glucose-Capillary: 267 mg/dL — ABNORMAL HIGH (ref 70–99)

## 2020-09-27 LAB — BASIC METABOLIC PANEL
Anion gap: 8 (ref 5–15)
BUN: 24 mg/dL — ABNORMAL HIGH (ref 8–23)
CO2: 20 mmol/L — ABNORMAL LOW (ref 22–32)
Calcium: 10 mg/dL (ref 8.9–10.3)
Chloride: 107 mmol/L (ref 98–111)
Creatinine, Ser: 1.58 mg/dL — ABNORMAL HIGH (ref 0.61–1.24)
GFR, Estimated: 48 mL/min — ABNORMAL LOW (ref >60)
Glucose, Bld: 211 mg/dL — ABNORMAL HIGH (ref 70–99)
Potassium: 4.4 mmol/L (ref 3.5–5.1)
Sodium: 135 mmol/L (ref 135–145)

## 2020-09-27 LAB — CBC
HCT: 49.6 % (ref 39.0–52.0)
Hemoglobin: 16.7 g/dL (ref 13.0–17.0)
MCH: 31.5 pg (ref 26.0–34.0)
MCHC: 33.7 g/dL (ref 30.0–36.0)
MCV: 93.6 fL (ref 80.0–100.0)
Platelets: 242 10*3/uL (ref 150–400)
RBC: 5.3 MIL/uL (ref 4.22–5.81)
RDW: 15.1 % (ref 11.5–15.5)
WBC: 26 10*3/uL — ABNORMAL HIGH (ref 4.0–10.5)
nRBC: 0 % (ref 0.0–0.2)

## 2020-09-27 LAB — PROCALCITONIN: Procalcitonin: 0.13 ng/mL

## 2020-09-27 LAB — URINALYSIS, ROUTINE W REFLEX MICROSCOPIC
Bilirubin Urine: NEGATIVE
Glucose, UA: NEGATIVE mg/dL
Ketones, ur: NEGATIVE mg/dL
Nitrite: NEGATIVE
Protein, ur: 30 mg/dL — AB
RBC / HPF: >50 RBC/hpf — ABNORMAL HIGH (ref 0–5)
Specific Gravity, Urine: 1.012 (ref 1.005–1.030)
WBC, UA: >50 WBC/hpf — ABNORMAL HIGH (ref 0–5)
pH: 6 (ref 5.0–8.0)

## 2020-09-27 LAB — PROTIME-INR
INR: 2.8 — ABNORMAL HIGH (ref 0.8–1.2)
Prothrombin Time: 29.6 seconds — ABNORMAL HIGH (ref 11.4–15.2)

## 2020-09-27 LAB — RESP PANEL BY RT-PCR (FLU A&B, COVID) ARPGX2
Influenza A by PCR: NEGATIVE
Influenza B by PCR: NEGATIVE
SARS Coronavirus 2 by RT PCR: NEGATIVE

## 2020-09-27 MED ORDER — MECLIZINE HCL 25 MG PO TABS
25.0000 mg | ORAL_TABLET | Freq: Once | ORAL | Status: AC
  Administered 2020-09-27: 25 mg via ORAL
  Filled 2020-09-27: qty 1

## 2020-09-27 MED ORDER — ASPIRIN EC 81 MG PO TBEC
81.0000 mg | DELAYED_RELEASE_TABLET | Freq: Every day | ORAL | Status: DC
  Administered 2020-09-28 – 2020-09-29 (×2): 81 mg via ORAL
  Filled 2020-09-27 (×2): qty 1

## 2020-09-27 MED ORDER — SERTRALINE HCL 50 MG PO TABS
50.0000 mg | ORAL_TABLET | Freq: Every day | ORAL | Status: DC
  Administered 2020-09-28 – 2020-09-29 (×2): 50 mg via ORAL
  Filled 2020-09-27 (×2): qty 1

## 2020-09-27 MED ORDER — LORATADINE 10 MG PO TABS
10.0000 mg | ORAL_TABLET | Freq: Every day | ORAL | Status: DC
  Administered 2020-09-28 – 2020-09-29 (×2): 10 mg via ORAL
  Filled 2020-09-27 (×2): qty 1

## 2020-09-27 MED ORDER — INSULIN ASPART 100 UNIT/ML IJ SOLN
0.0000 [IU] | Freq: Every day | INTRAMUSCULAR | Status: DC
Start: 2020-09-27 — End: 2020-09-29
  Administered 2020-09-27: 3 [IU] via SUBCUTANEOUS

## 2020-09-27 MED ORDER — ONDANSETRON HCL 4 MG PO TABS: 4.0000 mg | ORAL_TABLET | Freq: Four times a day (QID) | ORAL | Status: DC | PRN

## 2020-09-27 MED ORDER — DILTIAZEM HCL ER COATED BEADS 120 MG PO CP24
120.0000 mg | ORAL_CAPSULE | Freq: Every day | ORAL | Status: DC
  Administered 2020-09-27 – 2020-09-28 (×2): 120 mg via ORAL
  Filled 2020-09-27 (×2): qty 1

## 2020-09-27 MED ORDER — ACETAMINOPHEN 325 MG PO TABS: 650.0000 mg | ORAL_TABLET | Freq: Four times a day (QID) | ORAL | Status: DC | PRN

## 2020-09-27 MED ORDER — WARFARIN SODIUM 2.5 MG PO TABS
2.5000 mg | ORAL_TABLET | Freq: Once | ORAL | Status: AC
  Administered 2020-09-27: 2.5 mg via ORAL
  Filled 2020-09-27: qty 1

## 2020-09-27 MED ORDER — NICOTINE 21 MG/24HR TD PT24
21.0000 mg | MEDICATED_PATCH | Freq: Every day | TRANSDERMAL | Status: DC
  Administered 2020-09-27 – 2020-09-29 (×3): 21 mg via TRANSDERMAL
  Filled 2020-09-27 (×3): qty 1

## 2020-09-27 MED ORDER — ACETAMINOPHEN 650 MG RE SUPP: 650.0000 mg | Freq: Four times a day (QID) | RECTAL | Status: DC | PRN

## 2020-09-27 MED ORDER — ONDANSETRON HCL 4 MG/2ML IJ SOLN
4.0000 mg | Freq: Once | INTRAMUSCULAR | Status: AC
  Administered 2020-09-27: 4 mg via INTRAVENOUS
  Filled 2020-09-27: qty 2

## 2020-09-27 MED ORDER — SODIUM CHLORIDE 0.9 % IV BOLUS
1000.0000 mL | Freq: Once | INTRAVENOUS | Status: AC
  Administered 2020-09-27: 1000 mL via INTRAVENOUS

## 2020-09-27 MED ORDER — SODIUM CHLORIDE 0.9 % IV SOLN
1.0000 g | Freq: Once | INTRAVENOUS | Status: AC
  Administered 2020-09-27: 1 g via INTRAVENOUS
  Filled 2020-09-27: qty 10

## 2020-09-27 MED ORDER — INSULIN ASPART 100 UNIT/ML IJ SOLN
0.0000 [IU] | Freq: Three times a day (TID) | INTRAMUSCULAR | Status: DC
Start: 2020-09-28 — End: 2020-09-29
  Administered 2020-09-28: 7 [IU] via SUBCUTANEOUS
  Administered 2020-09-28: 2 [IU] via SUBCUTANEOUS
  Administered 2020-09-28 – 2020-09-29 (×2): 3 [IU] via SUBCUTANEOUS
  Administered 2020-09-29: 1 [IU] via SUBCUTANEOUS

## 2020-09-27 MED ORDER — WARFARIN - PHARMACIST DOSING INPATIENT: Freq: Every day | Status: DC

## 2020-09-27 MED ORDER — LEVOTHYROXINE SODIUM 50 MCG PO TABS
75.0000 ug | ORAL_TABLET | Freq: Every day | ORAL | Status: DC
  Administered 2020-09-28 – 2020-09-29 (×2): 75 ug via ORAL
  Filled 2020-09-27 (×2): qty 1

## 2020-09-27 MED ORDER — WARFARIN SODIUM 5 MG PO TABS: 5.0000 mg | ORAL_TABLET | Freq: Once | ORAL | Status: DC

## 2020-09-27 MED ORDER — MELATONIN 5 MG PO TABS: 2.5000 mg | ORAL_TABLET | Freq: Every evening | ORAL | Status: DC | PRN

## 2020-09-27 MED ORDER — ALBUTEROL SULFATE (2.5 MG/3ML) 0.083% IN NEBU: 2.5000 mg | INHALATION_SOLUTION | RESPIRATORY_TRACT | Status: DC | PRN

## 2020-09-27 MED ORDER — ONDANSETRON HCL 4 MG/2ML IJ SOLN: 4.0000 mg | Freq: Four times a day (QID) | INTRAMUSCULAR | Status: DC | PRN

## 2020-09-27 MED ORDER — SODIUM CHLORIDE 0.9 % IV SOLN
1.0000 g | INTRAVENOUS | Status: DC
  Administered 2020-09-28: 1 g via INTRAVENOUS
  Filled 2020-09-27 (×2): qty 10

## 2020-09-27 NOTE — Progress Notes (Signed)
ANTICOAGULATION CONSULT NOTE - Initial Consult  Pharmacy Consult for Warfarin Indication: atrial fibrillation  Allergies  Allergen Reactions   Contrast Media [Iodinated Diagnostic Agents] Hives and Other (See Comments)    Patient started sneezing and coughing, patient broke out in hives, patient needs 13 hour prep if having IV contrast   Ioxaglate Hives and Rash    Patient started sneezing and coughing, patient broke out in hives, patient needs 13 hour prep if having IV contrast   Atorvastatin Other (See Comments)    Myalgias   Crestor [Rosuvastatin] Other (See Comments)    Myalgias   Fenofibrate Other (See Comments)    MYALGIAS    Pravastatin Other (See Comments)    Myalgias    Pseudoephedrine Hcl Palpitations    Patient Measurements: Height: 5\' 8"  (172.7 cm) Weight: 90.2 kg (198 lb 13.7 oz) IBW/kg (Calculated) : 68.4  Vital Signs: Temp: 99.1 F (37.3 C) (07/08 1744) Temp Source: Oral (07/08 1744) BP: 149/79 (07/08 1744) Pulse Rate: 81 (07/08 1744)  Labs: Recent Labs    09/27/20 1126 09/27/20 1545  HGB 16.7  --   HCT 49.6  --   PLT 242  --   LABPROT  --  29.6*  INR  --  2.8*  CREATININE 1.58*  --     Estimated Creatinine Clearance: 49.5 mL/min (A) (by C-G formula based on SCr of 1.58 mg/dL (H)).   Medical History: Past Medical History:  Diagnosis Date   Anxiety    Borderline diabetes    CAD (coronary artery disease)    a. By cath 08/2013: 70% distal cx-prox LPDA; tandem mod LAD lesions, o/w mild dz.   CKD (chronic kidney disease), stage II    his creat. runs 2-2.5; hasnt seen neph yet - sees dr. Tresa Moore at [facility]   Complete heart block [facility])    a. s/p PPM    Deafness in left ear    Depression    Diabetes mellitus without complication ([place])    Diverticulitis of colon 15 years ago   Hernia    HTN (hypertension)    Hyperlipidemia    Hypogonadism male    Hypothyroidism    Lung nodule seen on imaging study 04/10/2014   5 mm nodule right lower lobe    Mitral stenosis    a. Dx 07/2013 - through workup of 2D echo, TEE, and cath, felt to be moderate.   Obesity    OSA on CPAP    Persistent atrial fibrillation (HCC)    a. sustained 160-180 on e-CARDIO monitor placed 07/29/2013. Placed on IV amiodarone at end of May 2015 due to continued paroxysms with RVR.   Renal cell carcinoma of right kidney ([place]) 09/05/2013   clear cell renal cell carcinoma of right kidney treated by radical nephrectomy, Fuhrman grade 2-3, with maximum tumor diameter 2.7 cm, all tumor to find confined to the kidney, and all surgical margins negative (T1aN0).     Renal mass    a. Dx 08/2013: concerning for renal cancer.   Rheumatic heart disease    S/P Minimally invasive maze operation for atrial fibrillation 05/15/2014   Left side lesion set using cryothermy with clipping of LA appendage   S/P minimally invasive mitral valve replacement with metallic valve and maze procedure 05/15/2014   60mm Sorin Carbomedics Optiform mechanical valve placed via right mini thoracotomy approach   Stroke ([place]) 1998   "MRI showed 3 mild strokes"    Medications:  Scheduled:   [START ON 09/28/2020] aspirin EC  81 mg Oral Daily   diltiazem  120 mg Oral QHS   insulin aspart  0-5 Units Subcutaneous QHS   [START ON 09/28/2020] insulin aspart  0-9 Units Subcutaneous TID WC   [START ON 09/28/2020] levothyroxine  75 mcg Oral QAC breakfast   [START ON 09/28/2020] loratadine  10 mg Oral Daily   nicotine  21 mg Transdermal Daily   [START ON 09/28/2020] sertraline  50 mg Oral Daily   warfarin  2.5 mg Oral Once   [START ON 09/28/2020] Warfarin - Pharmacist Dosing Inpatient   Does not apply q1600   Infusions:   [START ON 09/28/2020] cefTRIAXone (ROCEPHIN)  IV     PRN: acetaminophen **OR** acetaminophen, albuterol, melatonin, ondansetron **OR** ondansetron (ZOFRAN) IV  Assessment: 65 yo male admitted with hematuria attributed to infection.  Patient on chronic warfarin for hx Afib.  Home dose reported as 5mg   daily except 7.5mg  Tues/Thurs/Sun.  INR therapeutic (2.8) on admission, CBC wnl.  Goal of Therapy:  INR 2-3   Plan:  Warfarin 2.5mg  PO x 1 tonight Daily PT/INR  Peggyann Juba, PharmD, BCPS Pharmacy: [phone] 09/27/2020,6:57 PM

## 2020-09-27 NOTE — H&P (Signed)
History and Physical   Kevin English RSW:[id number] DOB: 03/02/1956 DOA: 09/27/2020  Referring MD/NP/PA: Dr. Roderic Palau, [place] PCP: Gweneth Fritter, FNP Outpatient Specialists: Dr. Carles Collet, Neurology; Dr. Irene Limbo, Hematology; Dr. Johney Frame, Cardiology  Patient coming from: Home  Chief Complaint: Hematuria  HPI: Kevin English is a 65 y.o. male with a complex medical history including RCC s/p nephrectomy, CKD, CAD, AFib s/p MAZE, MS s/p MVR, T2DM, HTN, CHB s/p PPM, hypothyroidism, hypogonadism, and depression among others who has had 1 month of progressive diffuse weakness and increasing falls and 1 day of hematuria with urinary hesitancy as well as confusion. He presented with his wife to urology office today where urinalysis was said to demonstrate UTI and gross hematuria. He appeared run down and "out of it," so he was sent to the ED where he's had some modest delirium that has waxed and waned. Initially unable to void, he has spontaneously provided clean catch sample which confirms infection. Hospitalists consulted for UTI causing delirium.   ED Course: Leukocytosis (26k), pyuria, hematuria with hgb 16.7.   Review of Systems: No fevers, and per HPI. All others reviewed and are negative.   Past Medical History:  Diagnosis Date   Anxiety    Borderline diabetes    CAD (coronary artery disease)    a. By cath 08/2013: 70% distal cx-prox LPDA; tandem mod LAD lesions, o/w mild dz.   CKD (chronic kidney disease), stage II    his creat. runs 2-2.5; hasnt seen neph yet - sees dr. Tresa [facility] at [facility]   Complete heart block [facility])    a. s/p PPM    Deafness in left ear    Depression    Diabetes mellitus without complication ([place])    Diverticulitis of colon 15 years ago   Hernia    HTN (hypertension)    Hyperlipidemia    Hypogonadism male    Hypothyroidism    Lung nodule seen on imaging study 04/10/2014   5 mm nodule right lower lobe   Mitral stenosis    a. Dx 07/2013 - through workup of 2D echo, TEE, and  cath, felt to be moderate.   Obesity    OSA on CPAP    Persistent atrial fibrillation (HCC)    a. sustained 160-180 on e-CARDIO monitor placed 07/29/2013. Placed on IV amiodarone at end of May 2015 due to continued paroxysms with RVR.   Renal cell carcinoma of right kidney ([place]) 09/05/2013   clear cell renal cell carcinoma of right kidney treated by radical nephrectomy, Fuhrman grade 2-3, with maximum tumor diameter 2.7 cm, all tumor to find confined to the kidney, and all surgical margins negative (T1aN0).     Renal mass    a. Dx 08/2013: concerning for renal cancer.   Rheumatic heart disease    S/P Minimally invasive maze operation for atrial fibrillation 05/15/2014   Left side lesion set using cryothermy with clipping of LA appendage   S/P minimally invasive mitral valve replacement with metallic valve and maze procedure 05/15/2014   48m Sorin [employer] Optiform mechanical valve placed via right mini thoracotomy approach   [place]   "MRI showed 3 mild strokes"   Past Surgical History:  Procedure Laterality Date   EYE SURGERY Right 1990   "reconstructive, lens implant- from paintball accident"   INGUINAL HERNIA REPAIR Bilateral first one in 831's  "I had one side done twice"   LEFT AND RIGHT HEART CATHETERIZATION WITH CORONARY ANGIOGRAM N/A 08/28/2013   Procedure: LEFT  AND RIGHT HEART CATHETERIZATION WITH CORONARY ANGIOGRAM;  Surgeon: Leonie Man, MD;  Location: [facility] CATH LAB;  Service: Cardiovascular;  Laterality: N/A;   MINIMALLY INVASIVE MAZE PROCEDURE N/A 05/15/2014   Procedure: MINIMALLY INVASIVE MAZE PROCEDURE;  Surgeon: Rexene Alberts, MD;  Location: [place];  Service: Open Heart Surgery;  Laterality: N/A;   MITRAL VALVE REPLACEMENT Right 05/15/2014   Procedure: MINIMALLY INVASIVE MITRAL VALVE (MV) REPLACEMENT;  Surgeon: Rexene Alberts, MD;  Location: [place];  Service: Open Heart Surgery;  Laterality: Right;   PERMANENT PACEMAKER INSERTION Left 05/22/2014   a. MDT dual  chamber PPM implanted by Dr Rayann Heman for CHB s/p MAZE/MVR   ROBOT ASSISTED LAPAROSCOPIC NEPHRECTOMY Right 09/05/2013   Procedure: ROBOTIC ASSISTED LAPAROSCOPIC RIGHT RADICAL NEPHRECTOMY;  Surgeon: Sharyn Creamer, MD;  Location: [facility];  Service: Urology;  Laterality: Right;   TEE WITHOUT CARDIOVERSION N/A 08/16/2013   Procedure: TRANSESOPHAGEAL ECHOCARDIOGRAM (TEE);  Surgeon: Dorothy Spark, MD;  Location: [place];  Service: Cardiovascular;  Laterality: N/A;   TEE WITHOUT CARDIOVERSION N/A 05/15/2014   Procedure: TRANSESOPHAGEAL ECHOCARDIOGRAM (TEE);  Surgeon: Rexene Alberts, MD;  Location: [place];  Service: Open Heart Surgery;  Laterality: N/A;   - Continues to smoke  reports that he has been smoking cigarettes and cigars. He has a 37.50 pack-year smoking history. He has never used smokeless tobacco. He reports that he does not drink alcohol and does not use drugs. Allergies  Allergen Reactions   Contrast Media [Iodinated Diagnostic Agents] Hives and Other (See Comments)    Patient started sneezing and coughing, patient broke out in hives, patient needs 13 hour prep if having IV contrast   Ioxaglate Hives and Rash    Patient started sneezing and coughing, patient broke out in hives, patient needs 13 hour prep if having IV contrast   Atorvastatin Other (See Comments)    Myalgias   Crestor [Rosuvastatin] Other (See Comments)    Myalgias   Fenofibrate Other (See Comments)    MYALGIAS    Pravastatin Other (See Comments)    Myalgias    Pseudoephedrine Hcl Palpitations   Family History  Problem Relation Age of Onset   Hypertension Mother    Heart attack Mother    Stroke Mother    Heart disease Father    Hypertension Other    Heart disease Brother    Diabetes Brother    Healthy Son    Healthy Daughter    - Family history otherwise reviewed and not pertinent.  Prior to Admission medications   Medication Sig Start Date End Date Taking? Authorizing Provider  acetaminophen  (TYLENOL) 325 MG tablet Take 325 mg by mouth every 6 (six) hours as needed (pain).    Yes [provider]  albuterol (VENTOLIN HFA) 108 (90 Base) MCG/ACT inhaler Inhale 2 puffs into the lungs every 4 (four) hours as needed for shortness of breath. 07/24/20  Yes [provider]  aspirin EC 81 MG tablet Take 1 tablet (81 mg total) by mouth daily. 05/26/14  Yes Barrett, Erin R, PA-C  diltiazem (CARDIZEM CD) 120 MG 24 hr capsule Take 1 capsule (120 mg total) by mouth at bedtime. 08/22/20  Yes Freada Bergeron, MD  levothyroxine (SYNTHROID) 75 MCG tablet TAKE 1 TABLET (75 MCG TOTAL) BY MOUTH DAILY BEFORE BREAKFAST. 03/15/19  Yes Libby Maw, MD  loratadine (CLARITIN) 10 MG tablet Take 1 tablet (10 mg total) by mouth daily. 09/10/15  Yes Dorothy Spark, MD  MELATONIN  GUMMIES PO Take 1-2 tablets by mouth at bedtime as needed (sleep).   Yes [provider]  metFORMIN (GLUCOPHAGE) 500 MG tablet Take 500 mg by mouth daily with breakfast. 01/18/20  Yes [provider]  Omega-3 Fatty Acids (FISH OIL) 1000 MG CAPS Take 1,000 mg by mouth daily.   Yes [provider]  sertraline (ZOLOFT) 50 MG tablet Take 50 mg by mouth daily. 05/02/19  Yes [provider]  valsartan (DIOVAN) 40 MG tablet Take 1 tablet (40 mg total) by mouth daily. 08/22/20  Yes Freada Bergeron, MD  warfarin (COUMADIN) 5 MG tablet TAKE 1 TO 1.5 TABLETS DAILY AS DIRECTED BY ANTICOAGULATION CLINIC Patient taking differently: Take 5-7.5 mg by mouth See admin instructions. Take 1 to 1.5 tablets daily as directed by Anticoagulation Clinic  on Sun, Tues, Thur, Saturday. All other days 5 mg daily. 01/01/20  Yes Dorothy Spark, MD  alfuzosin (UROXATRAL) 10 MG 24 hr tablet Take 10 mg by mouth daily with breakfast.    [provider]  Blood Glucose Monitoring Suppl (ONE TOUCH ULTRA MINI) w/Device KIT Use meter to check blood sugars 1-4 times daily as instructed. 10/22/16   [place] Circle, FNP  Continuous Blood Gluc Receiver (FREESTYLE LIBRE 2 READER) DEVI SCAN AS NEEDED FOR CONTINUOUS GLUCOSE MONITORING. 12/20/19   [provider]  Continuous Blood Gluc Sensor (FREESTYLE LIBRE 2 SENSOR) MISC USE AS DIRECTED EVERY 14 DAYS 04/24/20   [provider]  glucose blood (ONE TOUCH ULTRA TEST) test strip Use one strip per test. Test blood sugars 1-4 times daily as instructed. 09/22/18   Libby Maw, MD  Lancets [facility] DELICA PLUS [id number]) MISC USE 1 LANCET PER TEST. TEST BLOOD SUGARS 1 4 TIMES PER DAY AS INSTRUCTED. 09/22/18   [provider]  omega-3 acid ethyl esters (LOVAZA) 1 g capsule TAKE 2 CAPSULES (2 G TOTAL) BY MOUTH 2 (TWO) TIMES DAILY. Patient not taking: Reported on 09/27/2020 03/08/19   Dorothy Spark, MD  OneTouch Delica Lancets 21J MISC USE TO TEST BLOOD SUGARS 1-4 TIMES PER DAY AS INSTRUCTED 01/23/19   Libby Maw, MD    Physical Exam: Vitals:   09/27/20 1541 09/27/20 1600 09/27/20 1651 09/27/20 1744  BP: (!) 154/94 (!) 157/86 (!) 159/81 (!) 149/79  Pulse: 77 76 85 81  Resp: (!) 23 (!) 25 (!) 24 20  Temp:   98.5 F (36.9 C) 99.1 F (37.3 C)  TempSrc:   Oral Oral  SpO2: 98% 97% 97% 98%   Constitutional: 65 y.o. male in no distress, calm demeanor Eyes: Lids and conjunctivae normal, Right coloboma noted. ENMT: Mucous membranes are moist. Posterior pharynx clear of any exudate or lesions. Fair dentition.  Neck: normal, supple, no masses, no thyromegaly Respiratory: Non-labored breathing without accessory muscle use. Clear breath sounds to auscultation bilaterally save for upper airway expiratory sounds intermittently. SpO2 high 90%'s. Cardiovascular: Regular rate and rhythm, no murmurs, rubs, or gallops. No carotid bruits. No JVD. No LE edema. Palpable pedal pulses. Abdomen: Normoactive bowel sounds. No tenderness, non-distended, and no masses palpated. No hepatosplenomegaly. GU: No indwelling  catheter Musculoskeletal: No clubbing / cyanosis. No joint deformity upper and lower extremities. Good ROM, no contractures. Normal muscle tone.  Skin: Warm, dry. Healing abrasion on right knee without bony tenderness or any erythema/fluctuance.  Neurologic: CN II-XII grossly intact save for left hearing loss, decreased right eye (not field) visual acuity. Gait not assessed. Psychiatric: Alert and oriented x3, though referenced  letting his dog in the door which suggests some delirium.   Labs on Admission: I have personally reviewed following labs and imaging studies  CBC: Recent Labs  Lab 09/27/20 1126  WBC 26.0*  HGB 16.7  HCT 49.6  MCV 93.6  PLT 169   Basic Metabolic Panel: Recent Labs  Lab 09/27/20 1126  NA 135  K 4.4  CL 107  CO2 20*  GLUCOSE 211*  BUN 24*  CREATININE 1.58*  CALCIUM 10.0    Coagulation Profile: Recent Labs  Lab 09/27/20 1545  INR 2.8*   Urine analysis:    Component Value Date/Time   COLORURINE YELLOW 09/27/2020 1600   APPEARANCEUR HAZY (A) 09/27/2020 1600   LABSPEC 1.012 09/27/2020 1600   PHURINE 6.0 09/27/2020 1600   GLUCOSEU NEGATIVE 09/27/2020 1600   GLUCOSEU NEGATIVE 11/11/2018 1137   HGBUR LARGE (A) 09/27/2020 1600   BILIRUBINUR NEGATIVE 09/27/2020 1600   KETONESUR NEGATIVE 09/27/2020 1600   PROTEINUR 30 (A) 09/27/2020 1600   UROBILINOGEN 0.2 11/11/2018 1137   NITRITE NEGATIVE 09/27/2020 1600   LEUKOCYTESUR LARGE (A) 09/27/2020 1600   Recent Results (from the past 240 hour(s))  Resp Panel by RT-PCR (Flu A&B, Covid) Nasopharyngeal Swab     Status: None   Collection Time: 09/27/20  3:46 PM   Specimen: Nasopharyngeal Swab; Nasopharyngeal(NP) swabs in vial transport medium  Result Value Ref Range Status   SARS Coronavirus 2 by RT PCR NEGATIVE NEGATIVE Final    Comment: (NOTE) SARS-CoV-2 target nucleic acids are NOT DETECTED.  The SARS-CoV-2 RNA is generally detectable in upper respiratory specimens during the acute phase of  infection. The lowest concentration of SARS-CoV-2 viral copies this assay can detect is 138 copies/mL. A negative result does not preclude SARS-Cov-2 infection and should not be used as the sole basis for treatment or other patient management decisions. A negative result may occur with  improper specimen collection/handling, submission of specimen other than nasopharyngeal swab, presence of viral mutation(s) within the areas targeted by this assay, and inadequate number of viral copies(<138 copies/mL). A negative result must be combined with clinical observations, patient history, and epidemiological information. The expected result is Negative.  Fact Sheet for Patients:  EntrepreneurPulse.com.au  Fact Sheet for Healthcare Providers:  IncredibleEmployment.be  This test is no t yet approved or cleared by the [place] FDA and  has been authorized for detection and/or diagnosis of SARS-CoV-2 by FDA under an Emergency Use Authorization (EUA). This EUA will remain  in effect (meaning this test can be used) for the duration of the COVID-19 declaration under Section 564(b)(1) of the Act, 21 U.S.C.section 360bbb-3(b)(1), unless the authorization is terminated  or revoked sooner.       Influenza A by PCR NEGATIVE NEGATIVE Final   Influenza B by PCR NEGATIVE NEGATIVE Final    Comment: (NOTE) The Xpert Xpress SARS-CoV-2/FLU/RSV plus assay is intended as an aid in the diagnosis of influenza from Nasopharyngeal swab specimens and should not be used as a sole basis for treatment. Nasal washings and aspirates are unacceptable for Xpert Xpress SARS-CoV-2/FLU/RSV testing.  Fact Sheet for Patients: EntrepreneurPulse.com.au  Fact Sheet for Healthcare Providers: IncredibleEmployment.be  This test is not yet approved or cleared by the [place] FDA and has been authorized for detection and/or diagnosis of SARS-CoV-2  by FDA under an Emergency Use Authorization (EUA). This EUA will remain in effect (meaning this test can be used) for the duration of the COVID-19 declaration under Section 564(b)(1) of the Act, 21 U.S.C.  section 360bbb-3(b)(1), unless the authorization is terminated or revoked.  Performed at [facility], [place] [address], [place], [place] [address]      Radiological Exams on Admission: DG Chest 2 View  Result Date: 09/27/2020 CLINICAL DATA:  Shortness of breath. EXAM: CHEST - 2 VIEW COMPARISON:  05/02/2019 FINDINGS: Postsurgical changes compatible with a mitral valve replacement, left atrial appendage clipping and there is a dual chamber cardiac pacemaker. Heart size is within normal limits. The trachea is midline. Few patchy densities in the right lower lung appear to be chronic. Negative for a pneumothorax. No large pleural effusions. IMPRESSION: Stable chest radiograph findings.  No acute abnormalities. Electronically Signed   By: Markus Daft M.D.   On: 09/27/2020 12:10   CT Head Wo Contrast  Result Date: 09/27/2020 CLINICAL DATA:  Dizziness EXAM: CT HEAD WITHOUT CONTRAST TECHNIQUE: Contiguous axial images were obtained from the base of the skull through the vertex without intravenous contrast. COMPARISON:  05/06/2018 FINDINGS: Brain: No evidence of acute infarction, hemorrhage, hydrocephalus, extra-axial collection or mass lesion/mass effect. Moderate low-density changes within the periventricular and subcortical white matter compatible with chronic microvascular ischemic change. Mild-moderate diffuse cerebral volume loss. Vascular: No hyperdense vessel or unexpected calcification. Skull: Normal. Negative for fracture or focal lesion. Sinuses/Orbits: No acute finding. Other: None. IMPRESSION: 1. No acute intracranial findings. 2. Chronic microvascular ischemic change and cerebral volume loss. Electronically Signed   By: Davina Poke D.O.   On: 09/27/2020 14:14    EKG:  Independently reviewed. NSR w/IVCD and no ST elevations.   Assessment/Plan UTI: UA is consistent and worsening urinary hesitancy and hematuria felt to be symptomatic.  - Urine culture pending - Ceftriaxone empirically  Acute metabolic encephalopathy: Not at his baseline currently. CT head nonacute.  - Treat UTI. Further work up if not improving.   Leukocytosis: Actually a chronic condition, though it is elevated well above prior levels. Has had negative BM biopsy and follow up with heme, Dr. Irene Limbo 2017. PET scan was recommended, but declined at that time.  - Consider repeat hematology evaluation if pt wishes to pursue PET.   Gross hematuria, LUTS, reported history of BPH:  - Trend H/H. Pt with history polycythemia due to smoking.  - Urology aware of admission. No formal consultation as long as hematuria improves with UTI Tx.  - Pt feels he has intolerance to alfuzosin, so will not reorder this, though this or similar agent would certainly benefit this patient. Will continue discussions.   Stage IIIa CKD, s/p nephrectomy for RCC: CrCl near baseline.  - Avoid nephrotoxins  History of atrial fibrillation s/p MAZE, CAD s/p PCI, MVR, HTN:  - Continue coumadin anticoagulation per pharmacy. PT/INR was pending at admission, found to be therapeutic.  - Continue diltiazem, baby aspirin. Will hold valsartan for now as pt/wife are concerned that his progressive physical decline began when this was initiated. Defer further Tx to pt's cardiologist.   Depression:  - Continue sertraline.   Controlled T2DM:  - Hold metformin - SSI  Hypothyroidism:  - Continue synthroid  Hypogonadism: Pt reports he's been told he cannot have this treated due to risk of blood clots.  - Treatment with testosterone therapy should be considered as a trial on the basis of his very poor quality of life that may (or may not) be related to untreated low T. If this is initiated, coumadin dosing will need to be decreased, as  I believe this potentiates anticoagulant effects, and INR monitored closely. It  also will likely worsen polycythemia and HTN. I defer to pt's outpatient providers.    Debility, history of falls, progressive leg weakness:  - PT/OT - Of note there are many references in the patient's previous office notes to his falling and being unable/unwilling to get up with completely negative work up by neurology thought to be related to mood disorder.   OSA:  - CPAP  COPD: Presumptive diagnosis with ongoing tobacco use. No wheezing, though does have some upper airway sounds. CXR nonacute. - Consider pulmonary evaluation with PFTs and consider VCD. Will give prn nebs (covid negative).  Tobacco use:  - Nicotine patch - Cessation counseling  Coloboma, history of right eye trauma.  DVT prophylaxis: Coumadin  Code Status: Full  Family Communication: Wife at bedside Disposition Plan: Anticipate return home  Consults called: Urology, Dr. Matilde Sprang, by EDP, not formally consulted  Admission status: Observation    Patrecia Pour, MD [facility] [email or web address] 09/27/2020, 5:49 PM

## 2020-09-27 NOTE — Progress Notes (Signed)
Patient stated that he does not want to wear a cpap tonight. RN aware

## 2020-09-27 NOTE — ED Notes (Signed)
Pt attempted to give urine sample but was unable to urinate at this time.

## 2020-09-27 NOTE — Progress Notes (Signed)
Mrs. Kevin English stated patient had hearing aid in right ear upon admission. Cassie, RN found in laundry bag. Hearing aid placed cup, labeled, and placed on table beside patient.

## 2020-09-27 NOTE — Progress Notes (Signed)
Patient unable to provide all of admission history spoke with wife to complete.

## 2020-09-27 NOTE — Sepsis Progress Note (Signed)
Code sepsis protocol being monitored by eLink. 

## 2020-09-27 NOTE — ED Triage Notes (Signed)
Pt reports intermittent SHOB and dizziness that began 3 days ago. Pt becomes very off balance when he becomes dizzy. Pt also endorses groin pain x1 week. A&Ox4.

## 2020-09-28 ENCOUNTER — Observation Stay (HOSPITAL_COMMUNITY): Payer: PPO

## 2020-09-28 DIAGNOSIS — R319 Hematuria, unspecified: Secondary | ICD-10-CM | POA: Diagnosis not present

## 2020-09-28 DIAGNOSIS — F32A Depression, unspecified: Secondary | ICD-10-CM | POA: Diagnosis present

## 2020-09-28 DIAGNOSIS — R5381 Other malaise: Secondary | ICD-10-CM

## 2020-09-28 DIAGNOSIS — N401 Enlarged prostate with lower urinary tract symptoms: Secondary | ICD-10-CM | POA: Diagnosis present

## 2020-09-28 DIAGNOSIS — D751 Secondary polycythemia: Secondary | ICD-10-CM | POA: Diagnosis present

## 2020-09-28 DIAGNOSIS — G4733 Obstructive sleep apnea (adult) (pediatric): Secondary | ICD-10-CM | POA: Diagnosis present

## 2020-09-28 DIAGNOSIS — G9340 Encephalopathy, unspecified: Secondary | ICD-10-CM | POA: Diagnosis present

## 2020-09-28 DIAGNOSIS — I4819 Other persistent atrial fibrillation: Secondary | ICD-10-CM | POA: Diagnosis present

## 2020-09-28 DIAGNOSIS — I442 Atrioventricular block, complete: Secondary | ICD-10-CM | POA: Diagnosis present

## 2020-09-28 DIAGNOSIS — I2510 Atherosclerotic heart disease of native coronary artery without angina pectoris: Secondary | ICD-10-CM | POA: Diagnosis present

## 2020-09-28 DIAGNOSIS — N1831 Chronic kidney disease, stage 3a: Secondary | ICD-10-CM | POA: Diagnosis present

## 2020-09-28 DIAGNOSIS — F17210 Nicotine dependence, cigarettes, uncomplicated: Secondary | ICD-10-CM | POA: Diagnosis present

## 2020-09-28 DIAGNOSIS — F411 Generalized anxiety disorder: Secondary | ICD-10-CM | POA: Diagnosis present

## 2020-09-28 DIAGNOSIS — R310 Gross hematuria: Secondary | ICD-10-CM | POA: Diagnosis present

## 2020-09-28 DIAGNOSIS — I129 Hypertensive chronic kidney disease with stage 1 through stage 4 chronic kidney disease, or unspecified chronic kidney disease: Secondary | ICD-10-CM | POA: Diagnosis present

## 2020-09-28 DIAGNOSIS — I4891 Unspecified atrial fibrillation: Secondary | ICD-10-CM | POA: Diagnosis not present

## 2020-09-28 DIAGNOSIS — I4821 Permanent atrial fibrillation: Secondary | ICD-10-CM | POA: Diagnosis not present

## 2020-09-28 DIAGNOSIS — N3001 Acute cystitis with hematuria: Secondary | ICD-10-CM

## 2020-09-28 DIAGNOSIS — D72829 Elevated white blood cell count, unspecified: Secondary | ICD-10-CM | POA: Diagnosis not present

## 2020-09-28 DIAGNOSIS — H9192 Unspecified hearing loss, left ear: Secondary | ICD-10-CM | POA: Diagnosis present

## 2020-09-28 DIAGNOSIS — Z952 Presence of prosthetic heart valve: Secondary | ICD-10-CM | POA: Diagnosis not present

## 2020-09-28 DIAGNOSIS — N390 Urinary tract infection, site not specified: Secondary | ICD-10-CM | POA: Diagnosis present

## 2020-09-28 DIAGNOSIS — E039 Hypothyroidism, unspecified: Secondary | ICD-10-CM | POA: Diagnosis present

## 2020-09-28 DIAGNOSIS — Z20822 Contact with and (suspected) exposure to covid-19: Secondary | ICD-10-CM | POA: Diagnosis present

## 2020-09-28 DIAGNOSIS — E785 Hyperlipidemia, unspecified: Secondary | ICD-10-CM | POA: Diagnosis present

## 2020-09-28 DIAGNOSIS — R531 Weakness: Secondary | ICD-10-CM | POA: Diagnosis present

## 2020-09-28 DIAGNOSIS — Z950 Presence of cardiac pacemaker: Secondary | ICD-10-CM | POA: Diagnosis not present

## 2020-09-28 DIAGNOSIS — R3911 Hesitancy of micturition: Secondary | ICD-10-CM | POA: Diagnosis present

## 2020-09-28 DIAGNOSIS — E1122 Type 2 diabetes mellitus with diabetic chronic kidney disease: Secondary | ICD-10-CM | POA: Diagnosis present

## 2020-09-28 DIAGNOSIS — G9341 Metabolic encephalopathy: Secondary | ICD-10-CM | POA: Diagnosis present

## 2020-09-28 LAB — BASIC METABOLIC PANEL
Anion gap: 8 (ref 5–15)
BUN: 21 mg/dL (ref 8–23)
CO2: 23 mmol/L (ref 22–32)
Calcium: 10 mg/dL (ref 8.9–10.3)
Chloride: 108 mmol/L (ref 98–111)
Creatinine, Ser: 1.6 mg/dL — ABNORMAL HIGH (ref 0.61–1.24)
GFR, Estimated: 47 mL/min — ABNORMAL LOW (ref >60)
Glucose, Bld: 172 mg/dL — ABNORMAL HIGH (ref 70–99)
Potassium: 4.3 mmol/L (ref 3.5–5.1)
Sodium: 139 mmol/L (ref 135–145)

## 2020-09-28 LAB — CBC
HCT: 47.5 % (ref 39.0–52.0)
Hemoglobin: 15.8 g/dL (ref 13.0–17.0)
MCH: 31.3 pg (ref 26.0–34.0)
MCHC: 33.3 g/dL (ref 30.0–36.0)
MCV: 94.2 fL (ref 80.0–100.0)
Platelets: 214 10*3/uL (ref 150–400)
RBC: 5.04 MIL/uL (ref 4.22–5.81)
RDW: 15.2 % (ref 11.5–15.5)
WBC: 23.8 10*3/uL — ABNORMAL HIGH (ref 4.0–10.5)
nRBC: 0 % (ref 0.0–0.2)

## 2020-09-28 LAB — GLUCOSE, CAPILLARY
Glucose-Capillary: 225 mg/dL — ABNORMAL HIGH (ref 70–99)
Glucose-Capillary: 320 mg/dL — ABNORMAL HIGH (ref 70–99)
Glucose-Capillary: 184 mg/dL — ABNORMAL HIGH (ref 70–99)
Glucose-Capillary: 163 mg/dL — ABNORMAL HIGH (ref 70–99)

## 2020-09-28 LAB — PROTIME-INR
INR: 3 — ABNORMAL HIGH (ref 0.8–1.2)
Prothrombin Time: 31.5 seconds — ABNORMAL HIGH (ref 11.4–15.2)

## 2020-09-28 LAB — HIV ANTIBODY (ROUTINE TESTING W REFLEX): HIV Screen 4th Generation wRfx: NONREACTIVE

## 2020-09-28 MED ORDER — WARFARIN SODIUM 2.5 MG PO TABS
2.5000 mg | ORAL_TABLET | Freq: Once | ORAL | Status: AC
  Administered 2020-09-28: 2.5 mg via ORAL
  Filled 2020-09-28: qty 1

## 2020-09-28 NOTE — Progress Notes (Addendum)
ANTICOAGULATION CONSULT NOTE - Initial Consult  Pharmacy Consult for Warfarin Indication: atrial fibrillation  Allergies  Allergen Reactions   Contrast Media [Iodinated Diagnostic Agents] Hives and Other (See Comments)    Patient started sneezing and coughing, patient broke out in hives, patient needs 13 hour prep if having IV contrast   Ioxaglate Hives and Rash    Patient started sneezing and coughing, patient broke out in hives, patient needs 13 hour prep if having IV contrast   Atorvastatin Other (See Comments)    Myalgias   Crestor [Rosuvastatin] Other (See Comments)    Myalgias   Fenofibrate Other (See Comments)    MYALGIAS    Pravastatin Other (See Comments)    Myalgias    Pseudoephedrine Hcl Palpitations    Patient Measurements: Height: 5\' 8"  (172.7 cm) Weight: 90.2 kg (198 lb 13.7 oz) IBW/kg (Calculated) : 68.4  Vital Signs: Temp: 99.1 F (37.3 C) (07/09 0556) Temp Source: Oral (07/09 0556) BP: 128/61 (07/09 0556) Pulse Rate: 89 (07/09 0923)  Labs: Recent Labs    09/27/20 1126 09/27/20 1545 09/28/20 0543  HGB 16.7  --  15.8  HCT 49.6  --  47.5  PLT 242  --  214  LABPROT  --  29.6* 31.5*  INR  --  2.8* 3.0*  CREATININE 1.58*  --  1.60*     Estimated Creatinine Clearance: 48.9 mL/min (A) (by C-G formula based on SCr of 1.6 mg/dL (H)).   Medications:  Scheduled:   aspirin EC  81 mg Oral Daily   diltiazem  120 mg Oral QHS   insulin aspart  0-5 Units Subcutaneous QHS   insulin aspart  0-9 Units Subcutaneous TID WC   levothyroxine  75 mcg Oral QAC breakfast   loratadine  10 mg Oral Daily   nicotine  21 mg Transdermal Daily   sertraline  50 mg Oral Daily   Warfarin - Pharmacist Dosing Inpatient   Does not apply q1600   Infusions:   cefTRIAXone (ROCEPHIN)  IV     PRN: acetaminophen **OR** acetaminophen, albuterol, melatonin, ondansetron **OR** ondansetron (ZOFRAN) IV  Assessment: 65 yo male admitted with hematuria attributed to infection.  Patient on chronic warfarin for hx Afib. Pharmacy consulted to continue warfarin while admitted.  Baseline INR therapeutic Prior anticoagulation: ASA 81 mg, warfarin 5 mg daily except 7.5 mg Tues/Thurs/Sun; LD 7/7 PTA  Significant events:  Today, 09/28/2020: CBC: stable WNL INR remains therapeutic, though now borderline high Major drug interactions: none; continues on ASA 81 mg for CAD Per RN, urine not completely clear, but significantly improved from yesterday; no other bleeding concerns Diet ordered, not charting intake  Goal of Therapy: INR 2-3  Plan: Warfarin 2.5 mg PO tonight at 16:00 Daily INR CBC at least q72 hr while on warfarin Monitor closely for signs of bleeding or thrombosis   Reuel Boom, PharmD, BCPS [phone] 09/28/2020, 11:05 AM

## 2020-09-28 NOTE — Evaluation (Signed)
Physical Therapy Evaluation Patient Details Name: Kevin English MRN: [id number] DOB: April 30, 1955 Today's Date: 09/28/2020   History of Present Illness  Pt is a 33 Admitted with weakness, hematuria, UTI, delirium and acute metabolic encephalopathy.  PMH s/p mini MVR CHF, AFib, OSA periods of dizziness over the last few years. Pt with home CPAP use. s/p permanent pacemaker placement 3/01.  Clinical Impression  Assessed pt's mobility this afternoon. Pt with modA to [place] for mobility ( 2 person assist for safety). Pt far from his baseline which is ambulation with no AD and driving. Pt with very little words, max A for sitting balance EOB, and great weakness in trunk and B LE. Was able to walk with 2 person assist and followed by recliner closely, but frequent stops to focus pt on knee extension and upright standing due to weakness or decreased coordination and motor planning in trunk and BLE with ambulation. Pt has festinating gait and flat affect, pt's wife states this comes and goes just recently, but was able to walk into the ED just yesterday.      Follow Up Recommendations SNF (unless pt progresses to be able to go home)    Equipment Recommendations  Rolling walker with 5" wheels    Recommendations for Other Services       Precautions / Restrictions Precautions Precautions: Fall Restrictions Weight Bearing Restrictions: No      Mobility  Bed Mobility Overal bed mobility: Needs Assistance Bed Mobility: Supine to Sit     Supine to sit: HOB elevated;Mod assist     General bed mobility comments: reqired simple cues, tactile and hand gestures to perform task. unable to scoot to assist with balance or sitting EOB.    Transfers Overall transfer level: Needs assistance Equipment used: Rolling walker (2 wheeled) Transfers: Sit to/from Stand Sit to Stand: Mod assist;+2 physical assistance Stand pivot transfers: Mod assist;+2 physical assistance       General transfer comment: Pt  need assist to stand upright and assist at trunk for stability adn assist at RLE for knee extension to sustain upright standing.  Ambulation/Gait Ambulation/Gait assistance: Mod assist;+2 physical assistance (plus 3rd person following with recliner) Gait Distance (Feet): 40 Feet Assistive device: Rolling walker (2 wheeled) Gait Pattern/deviations: Step-through pattern;Shuffle;Festinating     General Gait Details: small festinating gait with stopping staitc standing breaks ever 2-3 steps to encourage B knee extension with tackle and assistng cues and knees to prevent buckling BLEs, also trunk cues verbal and tactile  Stairs            Wheelchair Mobility    Modified Rankin (Stroke Patients Only)       Balance Overall balance assessment: History of Falls;Needs assistance Sitting-balance support: Bilateral upper extremity supported;Feet supported Sitting balance-Leahy Scale: Zero     Standing balance support: Bilateral upper extremity supported;During functional activity Standing balance-Leahy Scale: Poor                               Pertinent Vitals/Pain Pain Assessment: No/denies pain    Home Living Family/patient expects to be discharged to:: Private residence Living Arrangements: Spouse/significant other Available Help at Discharge: Available 24 hours/day Type of Home: House Home Access: Level entry     Home Layout: Two level Home Equipment: Grab bars - tub/shower;Shower seat - built in;Hand held Veterinary surgeon - single point      Prior Function Level of Independence: Independent  Comments: 2-3 falls in past week. Has been independent with ambulation and ADLs, however recently with decline and weakness, and occassional shiffled gait, wa;k with stiff small step pattern, ans sleeps a lot.     Hand Dominance   Dominant Hand: Right    Extremity/Trunk Assessment        Lower Extremity Assessment Lower Extremity Assessment:  Generalized weakness (difficulty to assess,noted some discoordination, and weakness with inability to sustain contraction or resistance.)    Cervical / Trunk Assessment Cervical / Trunk Assessment:  (very weak in trunk with sitting balance EOB with no rightin reaction or ability to upright independently. performed some lateral leans to help center patint and improved a little but could not sustain center and balance independently.)  Communication   Communication: No difficulties  Cognition Arousal/Alertness: Awake/alert Behavior During Therapy: WFL for tasks assessed/performed Overall Cognitive Status: Impaired/Different from baseline Area of Impairment: Orientation;Attention;Safety/judgement;Awareness;Problem solving                 Orientation Level: Situation Current Attention Level: Sustained     Safety/Judgement: Decreased awareness of deficits;Decreased awareness of safety Awareness: Intellectual Problem Solving: Slow processing;Requires verbal cues;Difficulty sequencing General Comments: responds only with one word, and flat affect, but follows commands, slow to process      General Comments      Exercises     Assessment/Plan    PT Assessment Patient needs continued PT services  PT Problem List Decreased strength;Decreased activity tolerance;Decreased balance;Decreased mobility       PT Treatment Interventions Gait training;DME instruction;Functional mobility training;Therapeutic activities;Therapeutic exercise;Neuromuscular re-education;Balance training;Patient/family education    PT Goals (Current goals can be found in the Care Plan section)  Acute Rehab PT Goals PT Goal Formulation: Patient unable to participate in goal setting Time For Goal Achievement: 10/12/20 Potential to Achieve Goals: Fair    Frequency Min 3X/week   Barriers to discharge        Co-evaluation               AM-PAC PT "6 Clicks" Mobility  Outcome Measure Help needed  turning from your back to your side while in a flat bed without using bedrails?: A Lot Help needed moving from lying on your back to sitting on the side of a flat bed without using bedrails?: A Lot Help needed moving to and from a bed to a chair (including a wheelchair)?: A Lot Help needed standing up from a chair using your arms (e.g., wheelchair or bedside chair)?: A Lot Help needed to walk in hospital room?: A Lot Help needed climbing 3-5 steps with a railing? : Total 6 Click Score: 11    End of Session Equipment Utilized During Treatment: Gait belt Activity Tolerance: Patient tolerated treatment well Patient left: in chair;with call bell/phone within reach;with family/visitor present Nurse Communication: Mobility status PT Visit Diagnosis: Unsteadiness on feet (R26.81);Muscle weakness (generalized) (M62.81);Other abnormalities of gait and mobility (R26.89);Repeated falls (R29.6)    Time: 5456-2563 PT Time Calculation (min) (ACUTE ONLY): 35 min   Charges:   PT Evaluation $PT Eval Low Complexity: 1 Low PT Treatments $Gait Training: 8-22 mins        Era Parr, PT, MPT Acute Rehabilitation Services Office: [phone] Pager: [phone] 09/28/2020   Clide Dales 09/28/2020, 7:35 PM

## 2020-09-28 NOTE — Progress Notes (Signed)
PT Note  Patient Details Name: Kevin English MRN: [id number] DOB: 1955-06-02        Assessed pt's mobility this afternoon. Pt with modA to [place] for mobility ( 2 person assist for safety). Pt far from his baseline which is ambulation with no AD and driving. Pt with very little words, max A for sitting balance EOB, and great weakness in trunk and B LE. Was able to walk with 2 person assist and followed by recliner closely, but frequent stops to focus pt on knee extension and upright standing. Pt has festinating giat and flat affect, pt's wife states this comes and goes.   Will continue to follow, full write up to follow as well.   At this time recommending SNF, but hoping pt will progress out of this acute phase.    Clide Dales 09/28/2020, 6:06 PM Gatha Mayer, PT, MPT Acute Rehabilitation Services Office: [phone] Pager: [phone] 09/28/2020

## 2020-09-28 NOTE — Progress Notes (Signed)
PT Note  Patient Details Name: Kevin English MRN: [id number] DOB: 08-16-1955   Chart reviewed and spoke with pt's wife at bedside for long time capturing history and PLOF. Pt worked with OT this morning and now up in recliner completed wiped out sleeping and not alert enough to discuss history with wife. However wife states pt does not have a good routine of sleeping , and sleeps a lot during the day with decreased activity progression over last few months.   Will try to assess pt this afternoon.    Clide Dales 09/28/2020, 10:59 AM Gatha Mayer, PT, MPT Acute Rehabilitation Services Office: [phone] Pager: [phone] 09/28/2020

## 2020-09-28 NOTE — Progress Notes (Signed)
PROGRESS NOTE    HERON PITCOCK  NOB:[id number] DOB: [birth year] DOA: 09/27/2020 PCP: Gweneth Fritter, FNP    Brief Narrative:  65 year old gentleman with complicated medical history including renal cell carcinoma status post right nephrectomy, CKD stage IIIa, coronary artery disease, A. fib status post maze procedure, mitral stenosis s/p MVR on Coumadin and pacemaker secondary to complete heart block, hypothyroidism, hypogonadism and depression presented to urology office with progressive diffuse weakness, increasing fall and brisk hematuria and worsening confusion.  Apparently, patient has been progressively weak and symptoms are worse for about a month now.  He has significant debilitating conditions ongoing for about 9 years now.  Admit to the hospital with high WBC count and pyuria.   Assessment & Plan:   Principal Problem:   UTI (urinary tract infection) Active Problems:   Hypogonadism male   Leukocytosis   Chronic anticoagulation   Hypothyroidism (acquired)   Physical debility   CAD (coronary artery disease)   Atrial fibrillation (HCC)   History of permanent cardiac pacemaker placement   Essential hypertension   Anxiety state   OSA (obstructive sleep apnea)   Controlled type 2 diabetes mellitus with stage 3 chronic kidney disease, without long-term current use of insulin (HCC)   Chronic kidney disease, stage 3 (HCC)   H/O mitral valve replacement   History of renal cell carcinoma   Gait disturbance   Hematuria   Tobacco use  Acute UTI present on admission, suspected with leukocytosis and abnormal urine exam: Blood cultures and urine cultures negative so far.  Due to significant symptoms, will continue IV antibiotics today and wait for final cultures. Renal ultrasound was without any evidence of hydronephrosis.  His urine output is adequate.  Acute metabolic encephalopathy secondary to multiple factors, poor baseline health status and depression: CT head with no acute  findings.  No focal neurological deficits.  This is probably due to combination of multiple factors including underlying chronic medical issues and depression, hypogonadism and hypothyroidism. Continue home medications.  Will mobilize with PT OT and referred to rehab if possible. Patient has history of sleep apnea, intermittently uses CPAP, will continue to use CPAP to avoid hypercapnia.  CKD stage IIIa: At about his baseline.  History of A. fib, complete heart block status post permanent pacemaker: Stable.  In sinus rhythm.  Patient on Coumadin and therapeutic.  Reported gross hematuria but urine is clear today.  Continue rate control with diltiazem.  Depression: Followed by psychiatry as outpatient.  Currently on sertraline.  Hypothyroidism: Recent TSH was normal.  Continue Synthroid.  Type 2 diabetes: On metformin at home.  Currently on sliding scale insulin.  Resume metformin on discharge.    DVT prophylaxis:  warfarin (COUMADIN) tablet 2.5 mg   Code Status: Full code Family Communication: Wife at the bedside Disposition Plan: Status is: Observation  The patient will require care spanning > 2 midnights and should be moved to inpatient because: IV treatments appropriate due to intensity of illness or inability to take PO and Inpatient level of care appropriate due to severity of illness  Dispo: The patient is from: Home              Anticipated d/c is to: Home              Patient currently is not medically stable to d/c.   Difficult to place patient No         Consultants:  None  Procedures:  None  Antimicrobials:  Rocephin 7/8---  Subjective: Patient was seen and examined.  He was quite sleepy in the morning, hard of hearing and not much interactive.  Wife at the bedside.  Afebrile overnight.  He himself denies any abdominal pain nausea or vomiting.  He declined to use CPAP at night and now sleepy.  Objective: Vitals:   09/28/20 0133 09/28/20 0556 09/28/20  0923 09/28/20 1300  BP: 134/60 128/61  (!) 142/62  Pulse: 76 87 89 (!) 51  Resp: (!) 22 19 (!) 24 18  Temp:  99.1 F (37.3 C)  99.5 F (37.5 C)  TempSrc:  Oral  Oral  SpO2: 93% 93% 94% 96%  Weight:      Height:        Intake/Output Summary (Last 24 hours) at 09/28/2020 1321 Last data filed at 09/28/2020 0700 Gross per 24 hour  Intake --  Output 250 ml  Net -250 ml   Filed Weights   09/27/20 1744  Weight: 90.2 kg    Examination:  General exam: Appears calm and comfortable  Patient is quite debilitated and chronically sick looking.  Not in any acute distress.  Sleeping most of the interview.  Not very keen to conversation. Alert oriented x3 on stimulation. Has flat affect.  Denies any delusions or hallucinations. Respiratory system: Clear to auscultation. Respiratory effort normal. Cardiovascular system: S1 & S2 heard, RRR. No JVD, murmurs, rubs, gallops or clicks. No pedal edema. Pacemaker in place. Gastrointestinal system: Abdomen is nondistended, soft and nontender. No organomegaly or masses felt. Normal bowel sounds    Data Reviewed: I have personally reviewed following labs and imaging studies  CBC: Recent Labs  Lab 09/27/20 1126 09/28/20 0543  WBC 26.0* 23.8*  HGB 16.7 15.8  HCT 49.6 47.5  MCV 93.6 94.2  PLT 242 144   Basic Metabolic Panel: Recent Labs  Lab 09/27/20 1126 09/28/20 0543  NA 135 139  K 4.4 4.3  CL 107 108  CO2 20* 23  GLUCOSE 211* 172*  BUN 24* 21  CREATININE 1.58* 1.60*  CALCIUM 10.0 10.0   GFR: Estimated Creatinine Clearance: 48.9 mL/min (A) (by C-G formula based on SCr of 1.6 mg/dL (H)). Liver Function Tests: No results for input(s): AST, ALT, ALKPHOS, BILITOT, PROT, ALBUMIN in the last 168 hours. No results for input(s): LIPASE, AMYLASE in the last 168 hours. No results for input(s): AMMONIA in the last 168 hours. Coagulation Profile: Recent Labs  Lab 09/27/20 1545 09/28/20 0543  INR 2.8* 3.0*   Cardiac Enzymes: No  results for input(s): CKTOTAL, CKMB, CKMBINDEX, TROPONINI in the last 168 hours. BNP (last 3 results) No results for input(s): PROBNP in the last 8760 hours. HbA1C: No results for input(s): HGBA1C in the last 72 hours. CBG: Recent Labs  Lab 09/27/20 2153 09/28/20 0734 09/28/20 1139  GLUCAP 267* 225* 320*   Lipid Profile: No results for input(s): CHOL, HDL, LDLCALC, TRIG, CHOLHDL, LDLDIRECT in the last 72 hours. Thyroid Function Tests: No results for input(s): TSH, T4TOTAL, FREET4, T3FREE, THYROIDAB in the last 72 hours. Anemia Panel: No results for input(s): VITAMINB12, FOLATE, FERRITIN, TIBC, IRON, RETICCTPCT in the last 72 hours. Sepsis Labs: Recent Labs  Lab 09/27/20 1126  PROCALCITON 0.13    Recent Results (from the past 240 hour(s))  Blood culture (routine x 2)     Status: None (Preliminary result)   Collection Time: 09/27/20  3:45 PM   Specimen: BLOOD LEFT HAND  Result Value Ref Range Status   Specimen Description   Final  BLOOD LEFT HAND Performed at [facility] [address] [place], [place] [address]    Special Requests   Final    BOTTLES DRAWN AEROBIC ONLY Blood Culture results may not be optimal due to an inadequate volume of blood received in culture bottles Performed at [facility] [address] [place], [place] [address]    Culture   Final    NO GROWTH < 12 HOURS Performed at [facility] [address], [place], [place] [address]    Report Status PENDING  Incomplete  Blood culture (routine x 2)     Status: None (Preliminary result)   Collection Time: 09/27/20  3:45 PM   Specimen: BLOOD RIGHT HAND  Result Value Ref Range Status   Specimen Description   Final    BLOOD RIGHT HAND Performed at [facility] [address], [place], [place] [address]    Special Requests   Final    BOTTLES DRAWN AEROBIC AND ANAEROBIC Blood Culture results may not be optimal due to an inadequate  volume of blood received in culture bottles Performed at [facility] [address] [place], [place] [address]    Culture   Final    NO GROWTH < 12 HOURS Performed at [facility] [address] [place], [place] [address]    Report Status PENDING  Incomplete  Resp Panel by RT-PCR (Flu A&B, Covid) Nasopharyngeal Swab     Status: None   Collection Time: 09/27/20  3:46 PM   Specimen: Nasopharyngeal Swab; Nasopharyngeal(NP) swabs in vial transport medium  Result Value Ref Range Status   SARS Coronavirus 2 by RT PCR NEGATIVE NEGATIVE Final    Comment: (NOTE) SARS-CoV-2 target nucleic acids are NOT DETECTED.  The SARS-CoV-2 RNA is generally detectable in upper respiratory specimens during the acute phase of infection. The lowest concentration of SARS-CoV-2 viral copies this assay can detect is 138 copies/mL. A negative result does not preclude SARS-Cov-2 infection and should not be used as the sole basis for treatment or other patient management decisions. A negative result may occur with  improper specimen collection/handling, submission of specimen other than nasopharyngeal swab, presence of viral mutation(s) within the areas targeted by this assay, and inadequate number of viral copies(<138 copies/mL). A negative result must be combined with clinical observations, patient history, and epidemiological information. The expected result is Negative.  Fact Sheet for Patients:  EntrepreneurPulse.com.au  Fact Sheet for Healthcare Providers:  IncredibleEmployment.be  This test is no t yet approved or cleared by the [place] FDA and  has been authorized for detection and/or diagnosis of SARS-CoV-2 by FDA under an Emergency Use Authorization (EUA). This EUA will remain  in effect (meaning this test can be used) for the duration of the COVID-19 declaration under Section 564(b)(1) of the Act, 21 U.S.C.section  360bbb-3(b)(1), unless the authorization is terminated  or revoked sooner.       Influenza A by PCR NEGATIVE NEGATIVE Final   Influenza B by PCR NEGATIVE NEGATIVE Final    Comment: (NOTE) The Xpert Xpress SARS-CoV-2/FLU/RSV plus assay is intended as an aid in the diagnosis of influenza from Nasopharyngeal swab specimens and should not be used as a sole basis for treatment. Nasal washings and aspirates are unacceptable for Xpert Xpress SARS-CoV-2/FLU/RSV testing.  Fact Sheet for Patients: EntrepreneurPulse.com.au  Fact Sheet for Healthcare Providers: IncredibleEmployment.be  This test is not yet approved or cleared by the [place] and  has been authorized for detection and/or diagnosis of SARS-CoV-2 by FDA under an Emergency Use Authorization (EUA). This EUA will remain in effect (meaning this test can be used) for the duration of the COVID-19 declaration under Section 564(b)(1) of the Act, 21 U.S.C. section 360bbb-3(b)(1), unless the authorization is terminated or revoked.  Performed at [facility], [place] [facility] [address], [place], [place] [address]          Radiology Studies: DG Chest 2 View  Result Date: 09/27/2020 CLINICAL DATA:  Shortness of breath. EXAM: CHEST - 2 VIEW COMPARISON:  05/02/2019 FINDINGS: Postsurgical changes compatible with a mitral valve replacement, left atrial appendage clipping and there is a dual chamber cardiac pacemaker. Heart size is within normal limits. The trachea is midline. Few patchy densities in the right lower lung appear to be chronic. Negative for a pneumothorax. No large pleural effusions. IMPRESSION: Stable chest radiograph findings.  No acute abnormalities. Electronically Signed   By: Markus Daft M.D.   On: 09/27/2020 12:10   CT Head Wo Contrast  Result Date: 09/27/2020 CLINICAL DATA:  Dizziness EXAM: CT HEAD WITHOUT CONTRAST TECHNIQUE: Contiguous axial images were obtained from  the base of the skull through the vertex without intravenous contrast. COMPARISON:  05/06/2018 FINDINGS: Brain: No evidence of acute infarction, hemorrhage, hydrocephalus, extra-axial collection or mass lesion/mass effect. Moderate low-density changes within the periventricular and subcortical white matter compatible with chronic microvascular ischemic change. Mild-moderate diffuse cerebral volume loss. Vascular: No hyperdense vessel or unexpected calcification. Skull: Normal. Negative for fracture or focal lesion. Sinuses/Orbits: No acute finding. Other: None. IMPRESSION: 1. No acute intracranial findings. 2. Chronic microvascular ischemic change and cerebral volume loss. Electronically Signed   By: Davina Poke D.O.   On: 09/27/2020 14:14   US RENAL  Result Date: 09/28/2020 CLINICAL DATA:  Gross hematuria. Right nephrectomy for renal cell carcinoma. EXAM: RENAL / URINARY TRACT ULTRASOUND COMPLETE COMPARISON:  01/29/2017 abdominal CT from [facility]. FINDINGS: Right Kidney: Surgically absent Left Kidney: Renal measurements: 11.7 x 6.4 x 6.8 cm = volume: 266 mL. No hydronephrosis. 1.6 cm hypoechoic lesion with internal echogenicity involving the upper pole. Bladder: Appears normal for degree of bladder distention. Other: Mild degradation secondary to patient respiratory motion. IMPRESSION: Status post right nephrectomy. Hypoechoic upper pole left renal lesion is most likely a minimally complex cyst but technically indeterminate. Especially given the history of renal cell carcinoma, ultrasound is insufficient for evaluation of hematuria. Consider dedicated hematuria protocol CT. Electronically Signed   By: Abigail Miyamoto M.D.   On: 09/28/2020 09:49        Scheduled Meds:  aspirin EC  81 mg Oral Daily   diltiazem  120 mg Oral QHS   insulin aspart  0-5 Units Subcutaneous QHS   insulin aspart  0-9 Units Subcutaneous TID WC   levothyroxine  75 mcg Oral QAC breakfast   loratadine  10 mg Oral Daily    nicotine  21 mg Transdermal Daily   sertraline  50 mg Oral Daily   warfarin  2.5 mg Oral ONCE-1600   Warfarin - Pharmacist Dosing Inpatient   Does not apply q1600   Continuous Infusions:  cefTRIAXone (ROCEPHIN)  IV       LOS: 0 days    Time spent: 32 minutes    Barb Merino, MD [facility] Pager [phone]

## 2020-09-28 NOTE — Evaluation (Signed)
Occupational Therapy Evaluation Patient Details Name: Kevin English MRN: [id number] DOB: 1955-09-05 Today's Date: 09/28/2020    History of Present Illness Pt is a 49 Admitted with weakness, hematuria, UTI, delirium and acute metabolic encephalopathy.  PMH s/p mini MVR CHF, AFib, OSA periods of dizziness over the last few years. Pt with home CPAP use. s/p permanent pacemaker placement 3/01.   Clinical Impression   Patient is currently requiring assistance with ADLs including minimal-moderate assist with toileting, moderate to maximum assist with LE dressing, moderate assist with bathing, and Min guard to close supervision assist with UE dressing, grooming and eating, all of which is below patient's typical baseline of being Independent.  During this evaluation, patient was limited by impaired cognition with impaired motor control/motor planning, impulsivity and decreased awareness of safety/need for assistance as well as impaired activity tolerance, which has the potential to impact patient's safety and independence during functional mobility, as well as performance for ADLs. [place] "6-clicks" Daily Activity Inpatient Short Form score of 16/24 this session. Patient lives with his wife, who is able to provide 24/7 supervision and assistance.  Patient demonstrates good rehab potential, and should benefit from continued skilled occupational therapy services while in acute care to maximize safety, independence and quality of life at home.  Continued occupational therapy services in a SNF setting prior to return home is recommended.  ?   Follow Up Recommendations  SNF    Equipment Recommendations  Other (comment) (Defer to post-acute recommendations.)    Recommendations for Other Services       Precautions / Restrictions Precautions Precautions: Fall Restrictions Weight Bearing Restrictions: No      Mobility Bed Mobility Overal bed mobility: Needs Assistance Bed Mobility:  Supine to Sit     Supine to sit: HOB elevated;Mod assist     General bed mobility comments: Pt required step by step cues as pt with decreased initiation with verbal cue alone. Required assist to advance BLEs off bed and to raise trunk with pt initially leaning strongly to RT, possibly to lie back down.    Transfers Overall transfer level: Needs assistance Equipment used: 1 person hand held assist;Rolling walker (2 wheeled) Transfers: Sit to/from [facility] Sit to Stand: Min assist Stand pivot transfers: Min assist       General transfer comment: See ADL with toilet transfers.    Balance Overall balance assessment: History of Falls (Spouse endorses 3-4 falls last week. Reports that pt has baseline dizziness possibly from meds but was not falling previously.)                                         ADL either performed or assessed with clinical judgement   ADL Overall ADL's : Needs assistance/impaired Eating/Feeding: Supervision/ safety;Set up;Bed level Eating/Feeding Details (indicate cue type and reason): Pt being hand-fed at bed level by spouse. Pt and spouse educated on allowing pt to do as much for himself as possible to prevent weakness. Wife continued to hand feed pt until cues again. Pt then able to lift spoon and feed self. Grooming: Wash/dry hands;Sitting;Set up;Supervision/safety   Upper Body Bathing: Min guard;Sitting;Set up Upper Body Bathing Details (indicate cue type and reason): Min guard due to imapired motor planning and jerking movements. Lower Body Bathing: Moderate assistance;Sitting/lateral leans;Maximal assistance;Sit to/from stand   Upper Body Dressing : Minimal assistance;Sitting   Lower Body Dressing:  Maximal assistance;Sitting/lateral leans;Sit to/from Archivist: Cueing for sequencing;Cueing for safety;Minimal assistance;BSC;RW;Stand-pivot Armed forces technical officer Details (indicate cue type and reason): EOB>o  recliner with Min hand held Assist. Then pivoted Recliner<>BSC with RW and Min As with cues for safety and sequencing Toileting- Clothing Manipulation and Hygiene: Minimal assistance Toileting - Clothing Manipulation Details (indicate cue type and reason): External male cathter. Pt attmepted to void bowel on BSC but unable. Assisted for clothing management.     Functional mobility during ADLs: Minimal assistance;Moderate assistance;Rolling walker       Vision Patient Visual Report: No change from baseline       Perception     Praxis      Pertinent Vitals/Pain Pain Assessment: No/denies pain     Hand Dominance  (Pt stated "both". Spouse corrected pt.)   Extremity/Trunk Assessment Upper Extremity Assessment Upper Extremity Assessment: Overall WFL for tasks assessed   Lower Extremity Assessment Lower Extremity Assessment: Defer to PT evaluation       Communication Communication Communication: HOH (Deaf in LT ear. Speak in RT ear.)   Cognition Arousal/Alertness: Awake/alert Behavior During Therapy: WFL for tasks assessed/performed Overall Cognitive Status: Impaired/Different from baseline Area of Impairment: Orientation;Attention;Safety/judgement;Awareness;Problem solving                 Orientation Level: Situation Current Attention Level: Sustained     Safety/Judgement: Decreased awareness of deficits;Decreased awareness of safety Awareness: Intellectual Problem Solving: Slow processing;Requires verbal cues;Difficulty sequencing General Comments: Likes to joke, but remains confused. Needed cues to identifyreason for hospital stay.  Impulsive at times with unpredicable jerking movements affecting sitting and standing balance.   General Comments       Exercises     Shoulder Instructions      Home Living Family/patient expects to be discharged to:: Private residence Living Arrangements: Spouse/significant other Available Help at Discharge: Available 24  hours/day Type of Home: House Home Access: Level entry     Home Layout: Two level Alternate Level Stairs-Number of Steps: Pt does not access basement.   Bathroom Shower/Tub: Occupational psychologist: Standard (can push from sink)     Home Equipment: Grab bars - tub/shower;Shower seat - built in;Hand held Veterinary surgeon - single point          Prior Functioning/Environment Level of Independence: Independent        Comments: 2-3 falls in past week. Has been independent with ambulation and basic ADLs. Pt doe drive. Spouse performs all cooking/cleaning/laundry. Pt will still use his riding mower a little bit.        OT Problem List: Cardiopulmonary status limiting activity;Decreased cognition;Impaired sensation;Decreased activity tolerance;Decreased safety awareness;Decreased knowledge of use of DME or AE;Impaired balance (sitting and/or standing);Decreased knowledge of precautions      OT Treatment/Interventions: Self-care/ADL training;Therapeutic exercise;Therapeutic activities;Cognitive remediation/compensation;DME and/or AE instruction;Patient/family education;Balance training    OT Goals(Current goals can be found in the care plan section) Acute Rehab OT Goals Patient Stated Goal: Per spouse, for pt to return to baseline function and cognition. OT Goal Formulation: Patient unable to participate in goal setting Time For Goal Achievement: 10/12/20 Potential to Achieve Goals: Good ADL Goals Pt Will Perform Grooming: standing;with modified independence (3/3) Pt Will Perform Lower Body Bathing: with supervision;sitting/lateral leans;sit to/from stand;with caregiver independent in assisting Pt Will Perform Lower Body Dressing: with caregiver independent in assisting;with set-up;sitting/lateral leans;sit to/from stand;with supervision Pt Will Transfer to Toilet: with modified independence;ambulating;regular height toilet Pt Will Perform Toileting - Clothing Manipulation  and hygiene: with modified independence;sitting/lateral leans;sit to/from stand Additional ADL Goal #1: Pt will improve sitting balance to good static and dynamic in order to increase safety and participation during seated ADLs. Additional ADL Goal #2: Pt will engage in 10 min standing functional activities without loss of balance, in order to demonstrate improved activity tolerance and balance needed to perform ADLs safely at home.  OT Frequency: Min 2X/week   Barriers to D/C:    Spouse has no other help available       Co-evaluation              AM-PAC OT "6 Clicks" Daily Activity     Outcome Measure Help from another person eating meals?: A Little Help from another person taking care of personal grooming?: A Little Help from another person toileting, which includes using toliet, bedpan, or urinal?: A Little Help from another person bathing (including washing, rinsing, drying)?: A Lot Help from another person to put on and taking off regular upper body clothing?: A Little Help from another person to put on and taking off regular lower body clothing?: A Lot 6 Click Score: 16   End of Session Equipment Utilized During Treatment: Gait belt;Rolling walker Nurse Communication: Mobility status  Activity Tolerance: Patient limited by fatigue Patient left: in chair;with call bell/phone within reach;with chair alarm set;with family/visitor present  OT Visit Diagnosis: Unsteadiness on feet (R26.81);Other symptoms and signs involving the nervous system (R29.898);Other symptoms and signs involving cognitive function;Dizziness and giddiness (R42);Repeated falls (R29.6);History of falling (Z91.81)                Time: 3825-0539 OT Time Calculation (min): 29 min Charges:  OT General Charges $OT Visit: 1 Visit OT Evaluation $OT Eval Low Complexity: 1 Low OT Treatments $Self Care/Home Management : 8-22 mins  Anderson Malta, OT Acute Rehab Services Office: [phone] 09/28/2020  Julien Girt 09/28/2020, 10:54 AM

## 2020-09-29 DIAGNOSIS — I4821 Permanent atrial fibrillation: Secondary | ICD-10-CM

## 2020-09-29 DIAGNOSIS — G9340 Encephalopathy, unspecified: Secondary | ICD-10-CM

## 2020-09-29 DIAGNOSIS — E1122 Type 2 diabetes mellitus with diabetic chronic kidney disease: Secondary | ICD-10-CM

## 2020-09-29 DIAGNOSIS — N183 Chronic kidney disease, stage 3 unspecified: Secondary | ICD-10-CM

## 2020-09-29 LAB — CBC WITH DIFFERENTIAL/PLATELET
Abs Immature Granulocytes: 0.23 10*3/uL — ABNORMAL HIGH (ref 0.00–0.07)
Basophils Absolute: 0.1 10*3/uL (ref 0.0–0.1)
Basophils Relative: 0 %
Eosinophils Absolute: 0.2 10*3/uL (ref 0.0–0.5)
Eosinophils Relative: 1 %
HCT: 46.7 % (ref 39.0–52.0)
Hemoglobin: 15.5 g/dL (ref 13.0–17.0)
Immature Granulocytes: 1 %
Lymphocytes Relative: 10 %
Lymphs Abs: 2.4 10*3/uL (ref 0.7–4.0)
MCH: 31.8 pg (ref 26.0–34.0)
MCHC: 33.2 g/dL (ref 30.0–36.0)
MCV: 95.7 fL (ref 80.0–100.0)
Monocytes Absolute: 2.3 10*3/uL — ABNORMAL HIGH (ref 0.1–1.0)
Monocytes Relative: 10 %
Neutro Abs: 18.6 10*3/uL — ABNORMAL HIGH (ref 1.7–7.7)
Neutrophils Relative %: 78 %
Platelets: 222 10*3/uL (ref 150–400)
RBC: 4.88 MIL/uL (ref 4.22–5.81)
RDW: 15.1 % (ref 11.5–15.5)
WBC: 23.8 10*3/uL — ABNORMAL HIGH (ref 4.0–10.5)
nRBC: 0 % (ref 0.0–0.2)

## 2020-09-29 LAB — BASIC METABOLIC PANEL
Anion gap: 7 (ref 5–15)
BUN: 26 mg/dL — ABNORMAL HIGH (ref 8–23)
CO2: 24 mmol/L (ref 22–32)
Calcium: 9.9 mg/dL (ref 8.9–10.3)
Chloride: 108 mmol/L (ref 98–111)
Creatinine, Ser: 1.67 mg/dL — ABNORMAL HIGH (ref 0.61–1.24)
GFR, Estimated: 45 mL/min — ABNORMAL LOW (ref >60)
Glucose, Bld: 144 mg/dL — ABNORMAL HIGH (ref 70–99)
Potassium: 3.9 mmol/L (ref 3.5–5.1)
Sodium: 139 mmol/L (ref 135–145)

## 2020-09-29 LAB — GLUCOSE, CAPILLARY
Glucose-Capillary: 148 mg/dL — ABNORMAL HIGH (ref 70–99)
Glucose-Capillary: 225 mg/dL — ABNORMAL HIGH (ref 70–99)

## 2020-09-29 LAB — PHOSPHORUS: Phosphorus: 2.1 mg/dL — ABNORMAL LOW (ref 2.5–4.6)

## 2020-09-29 LAB — PROTIME-INR
INR: 2.4 — ABNORMAL HIGH (ref 0.8–1.2)
Prothrombin Time: 26 seconds — ABNORMAL HIGH (ref 11.4–15.2)

## 2020-09-29 LAB — MAGNESIUM: Magnesium: 2.2 mg/dL (ref 1.7–2.4)

## 2020-09-29 MED ORDER — SODIUM CHLORIDE 0.9 % IV SOLN
1.0000 g | Freq: Once | INTRAVENOUS | Status: AC
  Administered 2020-09-29: 1 g via INTRAVENOUS
  Filled 2020-09-29: qty 10

## 2020-09-29 MED ORDER — CEPHALEXIN 500 MG PO CAPS: 500.0000 mg | ORAL_CAPSULE | Freq: Four times a day (QID) | ORAL | 0 refills | Status: AC

## 2020-09-29 NOTE — Progress Notes (Signed)
[place] for Warfarin Indication: atrial fibrillation  Allergies  Allergen Reactions   Contrast Media [Iodinated Diagnostic Agents] Hives and Other (See Comments)    Patient started sneezing and coughing, patient broke out in hives, patient needs 13 hour prep if having IV contrast   Ioxaglate Hives and Rash    Patient started sneezing and coughing, patient broke out in hives, patient needs 13 hour prep if having IV contrast   Atorvastatin Other (See Comments)    Myalgias   Crestor [Rosuvastatin] Other (See Comments)    Myalgias   Fenofibrate Other (See Comments)    MYALGIAS    Pravastatin Other (See Comments)    Myalgias    Pseudoephedrine Hcl Palpitations    Patient Measurements: Height: 5\' 8"  (172.7 cm) Weight: 90.2 kg (198 lb 13.7 oz) IBW/kg (Calculated) : 68.4  Vital Signs: Temp: 98.6 F (37 C) (07/10 0502) Temp Source: Oral (07/10 0502) BP: 150/75 (07/10 0502) Pulse Rate: 76 (07/10 0502)  Labs: Recent Labs    09/27/20 1126 09/27/20 1545 09/28/20 0543 09/29/20 0529  HGB 16.7  --  15.8 15.5  HCT 49.6  --  47.5 46.7  PLT 242  --  214 222  LABPROT  --  29.6* 31.5* 26.0*  INR  --  2.8* 3.0* 2.4*  CREATININE 1.58*  --  1.60* 1.67*     Estimated Creatinine Clearance: 46.8 mL/min (A) (by C-G formula based on SCr of 1.67 mg/dL (H)).   Medications:  Scheduled:   aspirin EC  81 mg Oral Daily   diltiazem  120 mg Oral QHS   insulin aspart  0-5 Units Subcutaneous QHS   insulin aspart  0-9 Units Subcutaneous TID WC   levothyroxine  75 mcg Oral QAC breakfast   loratadine  10 mg Oral Daily   nicotine  21 mg Transdermal Daily   sertraline  50 mg Oral Daily   Warfarin - Pharmacist Dosing Inpatient   Does not apply q1600   Infusions:   cefTRIAXone (ROCEPHIN)  IV     PRN: acetaminophen **OR** acetaminophen, albuterol, melatonin, ondansetron **OR** ondansetron (ZOFRAN) IV  Assessment: 65 yo male admitted with hematuria  attributed to infection. Patient on chronic warfarin for hx Afib. Pharmacy consulted to continue warfarin while admitted.  Baseline INR therapeutic Prior anticoagulation: ASA 81 mg, warfarin 5 mg daily except 7.5 mg Tues/Thurs/Sun; LD 7/7 PTA  Significant events:  Today, 09/29/2020: CBC: stable WNL INR remains therapeutic; down from yesterday Major drug interactions: none; continues on ASA 81 mg for CAD Hematuria resolved; no other bleeding concerns  Goal of Therapy: INR 2-3  Plan: Discharging today Recommend resuming PTA warfarin regimen with no changes   Reuel Boom, PharmD, BCPS [phone] 09/29/2020, 12:38 PM

## 2020-09-29 NOTE — Discharge Summary (Signed)
Physician Discharge Summary  Kevin English XAJ:[id number] DOB: Jul 19, 1955 DOA: 09/27/2020  PCP: Kevin Fritter, FNP  Admit date: 09/27/2020 Discharge date: 09/29/2020  Admitted From: Home Disposition: Home with home health  Recommendations for Outpatient Follow-up:  Follow up with PCP in 1-2 weeks Please obtain BMP/CBC in one week Follow-up with urology and cardiology as planned.  Home Health: PT/OT Equipment/Devices: Walker  Discharge Condition: Stable CODE STATUS: Full code Diet recommendation: Low-carb diet  Discharge summary:  65 year old gentleman with complicated medical history including renal cell carcinoma status post right nephrectomy, CKD stage IIIa, coronary artery disease, chronic A. fib status post maze procedure, mitral stenosis status post mechanical valve on Coumadin, complete heart block status post pacemaker, hypothyroidism hypogonadism depression and debility presented with progressive weakness and brisk hematuria.  Unfortunately he has significant debilitating condition ongoing for last 9 years.  In the emergency room he was found to have elevated WBC count and pyuria so admitted with IV antibiotics and IV fluids.  Acute UTI present on admission: Treated with Rocephin, received 3 doses today.  Urine culture growing gram-negative more than 100,000 colonies.  Blood cultures negative.  Since he is clinically stabilized, will discharge home with oral cephalosporin for next 7 days, will treat as complicated UTI. Renal ultrasound was negative for hydronephrosis.  Patient does not have any more hematuria.  Acute metabolic encephalopathy secondary to multiple factors, poor baseline health status and depression: CT head with no acute findings.  No focal neurological deficits.  This is probably due to combination of multiple factors including underlying chronic medical issues and depression, hypogonadism and hypothyroidism and aggravated by acute infection. Advised consistent use  of CPAP. Mental status at baseline today.  He will benefit with ongoing PT OT at home.  Will prescribe home health PT OT. Wife reported worsening mental status and lethargic after starting losartan, his blood pressures are reasonably controlled, will discontinue losartan on family request.  CKD stage IIIa: At about his baseline.  History of A. fib, complete heart block status post permanent pacemaker: Stable.  In sinus rhythm.  Patient on Coumadin and therapeutic.  Reported gross hematuria but urine is clear today.  Continue rate control with diltiazem. Patient is on aspirin along with Coumadin with history of mitral valve repair 6 years ago, will defer to cardiology whether patient needs to be on aspirin.  Depression: Followed by psychiatry as outpatient.  Currently on sertraline.   Hypothyroidism: Recent TSH was normal.  Continue Synthroid.   Type 2 diabetes: On metformin at home.  Currently on sliding scale insulin.  Resume metformin on discharge.   Patient is medically stabilized.  He has chronic leukocytosis.  Received 3 days of IV antibiotics.  Going home with oral antibiotics.  Will benefit with ongoing therapies.  Discharge Diagnoses:  Principal Problem:   UTI (urinary tract infection) Active Problems:   Hypogonadism male   Leukocytosis   Chronic anticoagulation   Hypothyroidism (acquired)   Physical debility   CAD (coronary artery disease)   Atrial fibrillation (HCC)   History of permanent cardiac pacemaker placement   Essential hypertension   Anxiety state   OSA (obstructive sleep apnea)   Controlled type 2 diabetes mellitus with stage 3 chronic kidney disease, without long-term current use of insulin (HCC)   Chronic kidney disease, stage 3 (HCC)   H/O mitral valve replacement   History of renal cell carcinoma   Gait disturbance   Hematuria   Tobacco use   Encephalopathy    Discharge  Instructions  Discharge Instructions     Diet - low sodium heart healthy    Complete by: As directed    Diet Carb Modified   Complete by: As directed    Increase activity slowly   Complete by: As directed       Allergies as of 09/29/2020       Reactions   Contrast Media [iodinated Diagnostic Agents] Hives, Other (See Comments)   Patient started sneezing and coughing, patient broke out in hives, patient needs 13 hour prep if having IV contrast   Ioxaglate Hives, Rash   Patient started sneezing and coughing, patient broke out in hives, patient needs 13 hour prep if having IV contrast   Atorvastatin Other (See Comments)   Myalgias   Crestor [rosuvastatin] Other (See Comments)   Myalgias   Fenofibrate Other (See Comments)   MYALGIAS   Pravastatin Other (See Comments)   Myalgias   Pseudoephedrine Hcl Palpitations        Medication List     STOP taking these medications    alfuzosin 10 MG 24 hr tablet Commonly known as: UROXATRAL   omega-3 acid ethyl esters 1 g capsule Commonly known as: LOVAZA   valsartan 40 MG tablet Commonly known as: Diovan       TAKE these medications    acetaminophen 325 MG tablet Commonly known as: TYLENOL Take 325 mg by mouth every 6 (six) hours as needed (pain).   albuterol 108 (90 Base) MCG/ACT inhaler Commonly known as: VENTOLIN HFA Inhale 2 puffs into the lungs every 4 (four) hours as needed for shortness of breath.   aspirin EC 81 MG tablet Take 1 tablet (81 mg total) by mouth daily.   cephALEXin 500 MG capsule Commonly known as: KEFLEX Take 1 capsule (500 mg total) by mouth 4 (four) times daily for 7 days.   diltiazem 120 MG 24 hr capsule Commonly known as: CARDIZEM CD Take 1 capsule (120 mg total) by mouth at bedtime.   Fish Oil 1000 MG Caps Take 1,000 mg by mouth daily.   FreeStyle [place] 2 Reader Devi SCAN AS NEEDED FOR CONTINUOUS GLUCOSE MONITORING.   FreeStyle Libre 2 Sensor Misc USE AS DIRECTED EVERY 14 DAYS   glucose blood test strip Commonly known as: ONE TOUCH ULTRA TEST Use one  strip per test. Test blood sugars 1-4 times daily as instructed.   levothyroxine 75 MCG tablet Commonly known as: SYNTHROID TAKE 1 TABLET (75 MCG TOTAL) BY MOUTH DAILY BEFORE BREAKFAST.   loratadine 10 MG tablet Commonly known as: Claritin Take 1 tablet (10 mg total) by mouth daily.   MELATONIN GUMMIES PO Take 1-2 tablets by mouth at bedtime as needed (sleep).   metFORMIN 500 MG tablet Commonly known as: GLUCOPHAGE Take 500 mg by mouth daily with breakfast.   ONE TOUCH ULTRA MINI w/Device Kit Use meter to check blood sugars 1-4 times daily as instructed.   OneTouch Delica Plus [id number] Misc USE 1 LANCET PER TEST. TEST BLOOD SUGARS 1 4 TIMES PER DAY AS INSTRUCTED.   OneTouch Delica Lancets 30Q Misc USE TO TEST BLOOD SUGARS 1-4 TIMES PER DAY AS INSTRUCTED   sertraline 50 MG tablet Commonly known as: ZOLOFT Take 50 mg by mouth daily.   warfarin 5 MG tablet Commonly known as: COUMADIN Take as directed. If you are unsure how to take this medication, talk to your nurse or doctor. Original instructions: TAKE 1 TO 1.5 TABLETS DAILY AS DIRECTED BY ANTICOAGULATION CLINIC What changed: See the  new instructions.               Durable Medical Equipment  (From admission, onward)           Start     Ordered   09/29/20 1046  DME Walker  Once       Question Answer Comment  Walker: With [place] Wheels   Patient needs a walker to treat with the following condition Renal cell cancer, right ([place])      09/29/20 1046            Follow-up Information     Goins, Gillis Santa, FNP Follow up.   Specialty: Family Medicine Contact information: [place] [address] [phone]         Dorothy Spark, MD .   Specialty: Cardiology Contact information: [address] [place] [address] [phone]                Allergies  Allergen Reactions   Contrast Media [Iodinated Diagnostic Agents] Hives and Other (See Comments)     Patient started sneezing and coughing, patient broke out in hives, patient needs 13 hour prep if having IV contrast   Ioxaglate Hives and Rash    Patient started sneezing and coughing, patient broke out in hives, patient needs 13 hour prep if having IV contrast   Atorvastatin Other (See Comments)    Myalgias   Crestor [Rosuvastatin] Other (See Comments)    Myalgias   Fenofibrate Other (See Comments)    MYALGIAS    Pravastatin Other (See Comments)    Myalgias    Pseudoephedrine Hcl Palpitations    Consultations: None   Procedures/Studies: DG Chest 2 View  Result Date: 09/27/2020 CLINICAL DATA:  Shortness of breath. EXAM: CHEST - 2 VIEW COMPARISON:  05/02/2019 FINDINGS: Postsurgical changes compatible with a mitral valve replacement, left atrial appendage clipping and there is a dual chamber cardiac pacemaker. Heart size is within normal limits. The trachea is midline. Few patchy densities in the right lower lung appear to be chronic. Negative for a pneumothorax. No large pleural effusions. IMPRESSION: Stable chest radiograph findings.  No acute abnormalities. Electronically Signed   By: Markus Daft M.D.   On: 09/27/2020 12:10   CT Head Wo Contrast  Result Date: 09/27/2020 CLINICAL DATA:  Dizziness EXAM: CT HEAD WITHOUT CONTRAST TECHNIQUE: Contiguous axial images were obtained from the base of the skull through the vertex without intravenous contrast. COMPARISON:  05/06/2018 FINDINGS: Brain: No evidence of acute infarction, hemorrhage, hydrocephalus, extra-axial collection or mass lesion/mass effect. Moderate low-density changes within the periventricular and subcortical white matter compatible with chronic microvascular ischemic change. Mild-moderate diffuse cerebral volume loss. Vascular: No hyperdense vessel or unexpected calcification. Skull: Normal. Negative for fracture or focal lesion. Sinuses/Orbits: No acute finding. Other: None. IMPRESSION: 1. No acute intracranial findings. 2.  Chronic microvascular ischemic change and cerebral volume loss. Electronically Signed   By: Davina Poke D.O.   On: 09/27/2020 14:14   US RENAL  Result Date: 09/28/2020 CLINICAL DATA:  Gross hematuria. Right nephrectomy for renal cell carcinoma. EXAM: RENAL / URINARY TRACT ULTRASOUND COMPLETE COMPARISON:  01/29/2017 abdominal CT from [facility]. FINDINGS: Right Kidney: Surgically absent Left Kidney: Renal measurements: 11.7 x 6.4 x 6.8 cm = volume: 266 mL. No hydronephrosis. 1.6 cm hypoechoic lesion with internal echogenicity involving the upper pole. Bladder: Appears normal for degree of bladder distention. Other: Mild degradation secondary to patient respiratory motion. IMPRESSION: Status post  right nephrectomy. Hypoechoic upper pole left renal lesion is most likely a minimally complex cyst but technically indeterminate. Especially given the history of renal cell carcinoma, ultrasound is insufficient for evaluation of hematuria. Consider dedicated hematuria protocol CT. Electronically Signed   By: Abigail Miyamoto M.D.   On: 09/28/2020 09:49   (Echo, Carotid, EGD, Colonoscopy, ERCP)    Subjective: Patient seen and examined.  No overnight events.  Patient tells me that he has no issues and he is wanting to go home.   Discharge Exam: Vitals:   09/28/20 2001 09/29/20 0502  BP:  (!) 150/75  Pulse:  76  Resp:  16  Temp:  98.6 F (37 C)  SpO2: 95% 100%   Vitals:   09/28/20 1300 09/28/20 2001 09/29/20 0451 09/29/20 0502  BP: (!) 142/62 130/62  (!) 150/75  Pulse: (!) 51 87  76  Resp: 18 18  16   Temp: 99.5 F (37.5 C) 100 F (37.8 C)  98.6 F (37 C)  TempSrc: Oral Oral  Oral  SpO2: 96% 95%  100%  Weight:   90.2 kg   Height:        General: Pt is alert, awake, not in acute distress Chronically sick looking.  Debilitated and frail.  With flat affect. Cardiovascular: RRR, S1/S2 +, no rubs, no gallops, pacemaker in place. Respiratory: CTA bilaterally, no wheezing, no  rhonchi Abdominal: Soft, NT, ND, bowel sounds + Extremities: no edema, no cyanosis    The results of significant diagnostics from this hospitalization (including imaging, microbiology, ancillary and laboratory) are listed below for reference.     Microbiology: Recent Results (from the past 240 hour(s))  Blood culture (routine x 2)     Status: None (Preliminary result)   Collection Time: 09/27/20  3:45 PM   Specimen: BLOOD LEFT HAND  Result Value Ref Range Status   Specimen Description   Final    BLOOD LEFT HAND Performed at [facility], [place] [address], [place], [place] [address]    Special Requests   Final    BOTTLES DRAWN AEROBIC ONLY Blood Culture results may not be optimal due to an inadequate volume of blood received in culture bottles Performed at [place] [address], [place], [place] [address]    Culture   Final    NO GROWTH 2 DAYS Performed at [place] [address] [place], [place] [address]    Report Status PENDING  Incomplete  Blood culture (routine x 2)     Status: None (Preliminary result)   Collection Time: 09/27/20  3:45 PM   Specimen: BLOOD RIGHT HAND  Result Value Ref Range Status   Specimen Description   Final    BLOOD RIGHT HAND Performed at [place] [address], [place], [place] [address]    Special Requests   Final    BOTTLES DRAWN AEROBIC AND ANAEROBIC Blood Culture results may not be optimal due to an inadequate volume of blood received in culture bottles Performed at [place] [address], [place], [place] [address]    Culture   Final    NO GROWTH 2 DAYS Performed at [place] [address] [place], [place] [address]    Report Status PENDING  Incomplete  Resp Panel by RT-PCR (Flu A&B, Covid) Nasopharyngeal Swab     Status: None   Collection Time: 09/27/20  3:46 PM   Specimen: Nasopharyngeal Swab; Nasopharyngeal(NP) swabs in  vial transport medium  Result Value Ref Range Status   SARS Coronavirus 2 by RT PCR NEGATIVE NEGATIVE Final    Comment: (NOTE) SARS-CoV-2 target nucleic acids are NOT DETECTED.  The SARS-CoV-2 RNA is generally detectable in upper respiratory specimens during the acute phase of infection. The lowest concentration of SARS-CoV-2 viral copies this assay can detect is 138 copies/mL. A negative result does not preclude SARS-Cov-2 infection and should not be used as the sole basis for treatment or other patient management decisions. A negative result may occur with  improper specimen collection/handling, submission of specimen other than nasopharyngeal swab, presence of viral mutation(s) within the areas targeted by this assay, and inadequate number of viral copies(<138 copies/mL). A negative result must be combined with clinical observations, patient history, and epidemiological information. The expected result is Negative.  Fact Sheet for Patients:  EntrepreneurPulse.com.au  Fact Sheet for Healthcare Providers:  IncredibleEmployment.be  This test is no t yet approved or cleared by the [place] FDA and  has been authorized for detection and/or diagnosis of SARS-CoV-2 by FDA under an Emergency Use Authorization (EUA). This EUA will remain  in effect (meaning this test can be used) for the duration of the COVID-19 declaration under Section 564(b)(1) of the Act, 21 U.S.C.section 360bbb-3(b)(1), unless the authorization is terminated  or revoked sooner.       Influenza A by PCR NEGATIVE NEGATIVE Final   Influenza B by PCR NEGATIVE NEGATIVE Final    Comment: (NOTE) The Xpert Xpress SARS-CoV-2/FLU/RSV plus assay is intended as an aid in the diagnosis of influenza from Nasopharyngeal swab specimens and should not be used as a sole basis for treatment. Nasal washings and aspirates are unacceptable for Xpert Xpress SARS-CoV-2/FLU/RSV testing.  Fact  Sheet for Patients: EntrepreneurPulse.com.au  Fact Sheet for Healthcare Providers: IncredibleEmployment.be  This test is not yet approved or cleared by the [place] FDA and has been authorized for detection and/or diagnosis of SARS-CoV-2 by FDA under an Emergency Use Authorization (EUA). This EUA will remain in effect (meaning this test can be used) for the duration of the COVID-19 declaration under Section 564(b)(1) of the Act, 21 U.S.C. section 360bbb-3(b)(1), unless the authorization is terminated or revoked.  Performed at [facility], [place] [address], [place], [place] [address]   Urine Culture     Status: Abnormal (Preliminary result)   Collection Time: 09/27/20  4:00 PM   Specimen: Urine, Clean Catch  Result Value Ref Range Status   Specimen Description   Final    URINE, CLEAN CATCH Performed at [facility], [place] [address], [place], [place] [address]    Special Requests   Final    NONE Performed at [facility], [place] [address] [place], [place] [address]    Culture (A)  Final    >=100,000 COLONIES/mL GRAM NEGATIVE RODS SUSCEPTIBILITIES TO FOLLOW Performed at [facility], [place] [address] [place], [place] [address]    Report Status PENDING  Incomplete     Labs: BNP (last 3 results) No results for input(s): BNP in the last 8760 hours. Basic Metabolic Panel: Recent Labs  Lab 09/27/20 1126 09/28/20 0543 09/29/20 0529  NA 135 139 139  K 4.4 4.3 3.9  CL 107 108 108  CO2 20* 23 24  GLUCOSE 211* 172* 144*  BUN 24* 21 26*  CREATININE 1.58* 1.60* 1.67*  CALCIUM 10.0 10.0 9.9  MG  --   --  2.2  PHOS  --   --  2.1*   Liver Function Tests: No results for input(s): AST, ALT, ALKPHOS, BILITOT, PROT, ALBUMIN in the last 168 hours. No results for input(s): LIPASE, AMYLASE in the last 168 hours. No results for input(s): AMMONIA in the last 168 hours. CBC: Recent  Labs  Lab 09/27/20 1126 09/28/20 0543 09/29/20 0529  WBC 26.0* 23.8* 23.8*  NEUTROABS  --   --  18.6*  HGB 16.7 15.8 15.5  HCT 49.6 47.5 46.7  MCV 93.6 94.2 95.7  PLT 242 214 222   Cardiac Enzymes: No results for input(s): CKTOTAL, CKMB, CKMBINDEX, TROPONINI in the last 168 hours. BNP: Invalid input(s): POCBNP CBG: Recent Labs  Lab 09/28/20 0734 09/28/20 1139 09/28/20 1645 09/28/20 2142 09/29/20 0822  GLUCAP 225* 320* 184* 163* 148*   D-Dimer No results for input(s): DDIMER in the last 72 hours. Hgb A1c No results for input(s): HGBA1C in the last 72 hours. Lipid Profile No results for input(s): CHOL, HDL, LDLCALC, TRIG, CHOLHDL, LDLDIRECT in the last 72 hours. Thyroid function studies No results for input(s): TSH, T4TOTAL, T3FREE, THYROIDAB in the last 72 hours.  Invalid input(s): FREET3 Anemia work up No results for input(s): VITAMINB12, FOLATE, FERRITIN, TIBC, IRON, RETICCTPCT in the last 72 hours. Urinalysis    Component Value Date/Time   COLORURINE YELLOW 09/27/2020 1600   APPEARANCEUR HAZY (A) 09/27/2020 1600   LABSPEC 1.012 09/27/2020 1600   PHURINE 6.0 09/27/2020 1600   GLUCOSEU NEGATIVE 09/27/2020 1600   GLUCOSEU NEGATIVE 11/11/2018 1137   HGBUR LARGE (A) 09/27/2020 1600   BILIRUBINUR NEGATIVE 09/27/2020 1600   KETONESUR NEGATIVE 09/27/2020 1600   PROTEINUR 30 (A) 09/27/2020 1600   UROBILINOGEN 0.2 11/11/2018 1137   NITRITE NEGATIVE 09/27/2020 1600   LEUKOCYTESUR LARGE (A) 09/27/2020 1600   Sepsis Labs Invalid input(s): PROCALCITONIN,  WBC,  LACTICIDVEN Microbiology Recent Results (from the past 240 hour(s))  Blood culture (routine x 2)     Status: None (Preliminary result)   Collection Time: 09/27/20  3:45 PM   Specimen: BLOOD LEFT HAND  Result Value Ref Range Status   Specimen Description   Final    BLOOD LEFT HAND Performed at [facility], [place] [address], [place], [place] [address]    Special Requests   Final     BOTTLES DRAWN AEROBIC ONLY Blood Culture results may not be optimal due to an inadequate volume of blood received in culture bottles Performed at [place] [address], [place], [place] [address]    Culture   Final    NO GROWTH 2 DAYS Performed at [facility] [address], [place], [place] [address]    Report Status PENDING  Incomplete  Blood culture (routine x 2)     Status: None (Preliminary result)   Collection Time: 09/27/20  3:45 PM   Specimen: BLOOD RIGHT HAND  Result Value Ref Range Status   Specimen Description   Final    BLOOD RIGHT HAND Performed at [place] [address], [place], [place] [address]    Special Requests   Final    BOTTLES DRAWN AEROBIC AND ANAEROBIC Blood Culture results may not be optimal due to an inadequate volume of blood received in culture bottles Performed at [place] [address], [place], [place] [address]    Culture   Final    NO GROWTH 2 DAYS Performed at [place] [address] [place],  [address]    Report Status  PENDING  Incomplete  Resp Panel by RT-PCR (Flu A&B, Covid) Nasopharyngeal Swab     Status: None   Collection Time: 09/27/20  3:46 PM   Specimen: Nasopharyngeal Swab; Nasopharyngeal(NP) swabs in vial transport medium  Result Value Ref Range Status   SARS Coronavirus 2 by RT PCR NEGATIVE NEGATIVE Final    Comment: (NOTE) SARS-CoV-2 target nucleic acids are NOT DETECTED.  The SARS-CoV-2 RNA is generally detectable in upper respiratory specimens during the acute phase of infection. The lowest concentration of SARS-CoV-2 viral copies this assay can detect is 138 copies/mL. A negative result does not preclude SARS-Cov-2 infection and should not be used as the sole basis for treatment or other patient management decisions. A negative result may occur with  improper specimen collection/handling, submission of specimen other than  nasopharyngeal swab, presence of viral mutation(s) within the areas targeted by this assay, and inadequate number of viral copies(<138 copies/mL). A negative result must be combined with clinical observations, patient history, and epidemiological information. The expected result is Negative.  Fact Sheet for Patients:  EntrepreneurPulse.com.au  Fact Sheet for Healthcare Providers:  IncredibleEmployment.be  This test is no t yet approved or cleared by the [place] FDA and  has been authorized for detection and/or diagnosis of SARS-CoV-2 by FDA under an Emergency Use Authorization (EUA). This EUA will remain  in effect (meaning this test can be used) for the duration of the COVID-19 declaration under Section 564(b)(1) of the Act, 21 U.S.C.section 360bbb-3(b)(1), unless the authorization is terminated  or revoked sooner.       Influenza A by PCR NEGATIVE NEGATIVE Final   Influenza B by PCR NEGATIVE NEGATIVE Final    Comment: (NOTE) The Xpert Xpress SARS-CoV-2/FLU/RSV plus assay is intended as an aid in the diagnosis of influenza from Nasopharyngeal swab specimens and should not be used as a sole basis for treatment. Nasal washings and aspirates are unacceptable for Xpert Xpress SARS-CoV-2/FLU/RSV testing.  Fact Sheet for Patients: EntrepreneurPulse.com.au  Fact Sheet for Healthcare Providers: IncredibleEmployment.be  This test is not yet approved or cleared by the [place] FDA and has been authorized for detection and/or diagnosis of SARS-CoV-2 by FDA under an Emergency Use Authorization (EUA). This EUA will remain in effect (meaning this test can be used) for the duration of the COVID-19 declaration under Section 564(b)(1) of the Act, 21 U.S.C. section 360bbb-3(b)(1), unless the authorization is terminated or revoked.  Performed at [facility], [place] [address] [place], [place] [address]   Urine Culture     Status: Abnormal (Preliminary result)   Collection Time: 09/27/20  4:00 PM   Specimen: Urine, Clean Catch  Result Value Ref Range Status   Specimen Description   Final    URINE, CLEAN CATCH Performed at [facility], [place] [address] [place], [place] [address]    Special Requests   Final    NONE Performed at [facility], [place] [address], [place], [place] [address]    Culture (A)  Final    >=100,000 COLONIES/mL GRAM NEGATIVE RODS SUSCEPTIBILITIES TO FOLLOW Performed at [facility], [place] [address] [place], [place] [address]    Report Status PENDING  Incomplete     Time coordinating discharge:  35 minutes  SIGNED:   Barb Merino, MD  [facility] 09/29/2020, 11:58 AM

## 2020-09-29 NOTE — TOC Transition Note (Signed)
Transition of Care [facility]) - CM/SW Discharge Note   Patient Details  Name: NORMAL RECINOS MRN: [id number] Date of Birth: 09-13-1955  Transition of Care [facility]) CM/SW Contact:  Lennart Pall, LCSW Phone Number: 09/29/2020, 3:03 PM   Clinical Narrative:    Pt medically cleared for dc today and orders placed for HHPT/OT and rolling walker.  DME ordered via [employer] and has been delivered to room.  Have informed MD/RN/ patient and wife that I have contacted ALL HH agencies covering their home area and ALL have declined citing either being out of network with insurance or no staffing to accommodate.  Pt and wife understand this and wife feels she can manage his care at home and OK with no therapy follow up.  No further TOC needs.   Final next level of care: Home/Self Care Barriers to Discharge: No [place] will accept this patient   Patient Goals and CMS Choice Patient states their goals for this hospitalization and ongoing recovery are:: go home      Discharge Placement                       Discharge Plan and Services                DME Arranged: Walker rolling DME Agency: [employer] Date DME Agency Contacted: 09/29/20 Time DME Agency Contacted: 75 Representative spoke with at DME Agency: [place] ([place] Interventions     Readmission Risk Interventions No flowsheet data found.

## 2020-09-30 LAB — URINE CULTURE: Culture: >=100000 — AB

## 2020-10-01 ENCOUNTER — Ambulatory Visit (INDEPENDENT_AMBULATORY_CARE_PROVIDER_SITE_OTHER): Payer: PPO

## 2020-10-01 ENCOUNTER — Other Ambulatory Visit: Payer: Self-pay

## 2020-10-01 DIAGNOSIS — I480 Paroxysmal atrial fibrillation: Secondary | ICD-10-CM

## 2020-10-01 DIAGNOSIS — Z5181 Encounter for therapeutic drug level monitoring: Secondary | ICD-10-CM | POA: Diagnosis not present

## 2020-10-01 LAB — POCT INR: INR: 3.5 — AB (ref 2.0–3.0)

## 2020-10-01 NOTE — Patient Instructions (Signed)
Continue 1.5 tablets daily except 1 tablet each Monday, Wednesday and Friday.  Eat greens over the next few days (every other). Repeat INR in 6 weeks.. [phone]

## 2020-10-02 LAB — CULTURE, BLOOD (ROUTINE X 2)
Culture: NO GROWTH
Culture: NO GROWTH

## 2020-10-05 NOTE — ED Provider Notes (Signed)
[facility] Provider Note   CSN: [id number] Arrival date & time: 09/27/20  1047     History Chief Complaint  Patient presents with   Shortness of Breath   Dizziness   Groin Pain    Kevin English is a 65 y.o. male.  Patient complains of weakness.  He was seen in the urology clinic with a urinary tract infection.  He also has significant confusion  The history is provided by the patient. No language interpreter was used.  Weakness Severity:  Moderate Onset quality:  Sudden Duration:  2 days Timing:  Constant Progression:  Worsening Chronicity:  Recurrent Context: not alcohol use   Relieved by:  Nothing Worsened by:  Nothing Associated symptoms: no abdominal pain, no chest pain, no cough, no diarrhea, no frequency, no headaches and no seizures       Past Medical History:  Diagnosis Date   Anxiety    Borderline diabetes    CAD (coronary artery disease)    a. By cath 08/2013: 70% distal cx-prox LPDA; tandem mod LAD lesions, o/w mild dz.   CKD (chronic kidney disease), stage II    his creat. runs 2-2.5; hasnt seen neph yet - sees dr. Tresa [facility]   Complete heart block [facility])    a. s/p PPM    Deafness in left ear    Depression    Diabetes mellitus without complication ([place])    Diverticulitis of colon 15 years ago   Hernia    HTN (hypertension)    Hyperlipidemia    Hypogonadism male    Hypothyroidism    Lung nodule seen on imaging study 04/10/2014   5 mm nodule right lower lobe   Mitral stenosis    a. Dx 07/2013 - through workup of 2D echo, TEE, and cath, felt to be moderate.   Obesity    OSA on CPAP    Persistent atrial fibrillation (HCC)    a. sustained 160-180 on e-CARDIO monitor placed 07/29/2013. Placed on IV amiodarone at end of May 2015 due to continued paroxysms with RVR.   Renal cell carcinoma of right kidney ([place]) 09/05/2013   clear cell renal cell carcinoma of right kidney treated by radical nephrectomy, Fuhrman grade 2-3, with maximum  tumor diameter 2.7 cm, all tumor to find confined to the kidney, and all surgical margins negative (T1aN0).     Renal mass    a. Dx 08/2013: concerning for renal cancer.   Rheumatic heart disease    S/P Minimally invasive maze operation for atrial fibrillation 05/15/2014   Left side lesion set using cryothermy with clipping of LA appendage   S/P minimally invasive mitral valve replacement with metallic valve and maze procedure 05/15/2014   49m Sorin [employer]omedics Optiform mechanical valve placed via right mini thoracotomy approach   Stroke ([place] 1998   "MRI showed 3 mild strokes"    Patient Active Problem List   Diagnosis Date Noted   Encephalopathy 09/28/2020   UTI (urinary tract infection) 09/27/2020   Statin myopathy 02/10/2019   Tobacco use 01/23/2019   Moderate episode of recurrent major depressive disorder ([place] 01/18/2019   Hematuria 12/16/2018   Gait disturbance 04/08/2018   Leg cramps 04/08/2018   Fall 04/08/2018   Insomnia due to medical condition 10/25/2017   Hearing loss 10/04/2017   Depression, major, single episode, severe ([place] 10/04/2017   Continuous leakage of urine 07/01/2017   Controlled type 2 diabetes mellitus with stage 3 chronic kidney disease, without long-term current  use of insulin ([place]) 10/08/2016   Lower extremity weakness 03/05/2016   OSA (obstructive sleep apnea) 02/20/2016   Anxiety state 01/27/2016   History of renal cell carcinoma 08/06/2015   Chronic kidney disease, stage 3 ([employer]) 08/05/2015   H/O mitral valve replacement 08/05/2015   Malaise and fatigue 08/05/2015   Essential hypertension 03/09/2015   Chronic diastolic CHF (congestive heart failure), NYHA class 2 ([place]) 03/09/2015   Coronary artery disease due to lipid rich plaque 03/09/2015   History of permanent cardiac pacemaker placement 06/12/2014   CHB (complete heart block) ([place]) 05/22/2014   S/P minimally invasive mitral valve replacement with metallic valve and maze procedure 05/15/2014    S/P Minimally invasive maze operation for atrial fibrillation 05/15/2014   Yeast dermatitis 04/19/2014   Atrial fibrillation (HCC)    Lung nodule seen on imaging study 04/10/2014   Renal neoplasm 09/05/2013   Renal cell carcinoma of right kidney-nephrectomy June 2015 09/05/2013   CAD (coronary artery disease)    CKD (chronic kidney disease), stage II    Long term current use of anticoagulant therapy 08/21/2013   Healthcare maintenance 08/21/2013   Atrial fibrillation with RVR ([place]) 08/18/2013   Chronic anticoagulation 08/11/2013   Paroxysmal atrial fibrillation 08/11/2013   Hypothyroidism (acquired) 08/11/2013   Physical debility 08/11/2013   Acute on chronic diastolic CHF (congestive heart failure), NYHA class 3 ([place]) 08/09/2013   Chronic diastolic CHF (congestive heart failure) 08/09/2013   Rheumatic heart disease 08/02/2013   Mitral stenosis 08/02/2013   Other abnormal glucose    Benign neoplasm of colon    Hernia    Chronic rhinitis    Diverticulitis of colon    Mixed hyperlipidemia    HTN (hypertension)    Hypogonadism male    Leukocytosis    Myalgia and myositis, unspecified    Obesity    Vitamin D deficiency     Past Surgical History:  Procedure Laterality Date   EYE SURGERY Right 1990   "reconstructive, lens implant- from paintball accident"   [place] Bilateral first one in 25's   "I had one side done twice"   LEFT AND RIGHT HEART CATHETERIZATION WITH CORONARY ANGIOGRAM N/A 08/28/2013   Procedure: LEFT AND RIGHT HEART CATHETERIZATION WITH CORONARY ANGIOGRAM;  Surgeon: Leonie Man, MD;  Location: [facility] CATH LAB;  Service: Cardiovascular;  Laterality: N/A;   MINIMALLY INVASIVE MAZE PROCEDURE N/A 05/15/2014   Procedure: MINIMALLY INVASIVE MAZE PROCEDURE;  Surgeon: Rexene Alberts, MD;  Location: [place];  Service: Open Heart Surgery;  Laterality: N/A;   MITRAL VALVE REPLACEMENT Right 05/15/2014   Procedure: MINIMALLY INVASIVE MITRAL VALVE (MV) REPLACEMENT;   Surgeon: Rexene Alberts, MD;  Location: [place];  Service: Open Heart Surgery;  Laterality: Right;   PERMANENT PACEMAKER INSERTION Left 05/22/2014   a. MDT dual chamber PPM implanted by Dr Rayann Heman for CHB s/p MAZE/MVR   ROBOT ASSISTED LAPAROSCOPIC NEPHRECTOMY Right 09/05/2013   Procedure: ROBOTIC ASSISTED LAPAROSCOPIC RIGHT RADICAL NEPHRECTOMY;  Surgeon: Sharyn Creamer, MD;  Location: [facility];  Service: Urology;  Laterality: Right;   TEE WITHOUT CARDIOVERSION N/A 08/16/2013   Procedure: TRANSESOPHAGEAL ECHOCARDIOGRAM (TEE);  Surgeon: Dorothy Spark, MD;  Location: [place];  Service: Cardiovascular;  Laterality: N/A;   TEE WITHOUT CARDIOVERSION N/A 05/15/2014   Procedure: TRANSESOPHAGEAL ECHOCARDIOGRAM (TEE);  Surgeon: Rexene Alberts, MD;  Location: [place];  Service: Open Heart Surgery;  Laterality: N/A;       Family History  Problem Relation Age of Onset  Hypertension Mother    Heart attack Mother    Stroke Mother    Heart disease Father    Hypertension Other    Heart disease Brother    Diabetes Brother    Healthy Son    Healthy Daughter     Social History   Tobacco Use   Smoking status: Every Day    Packs/day: 0.75    Years: 50.00    Pack years: 37.50    Types: Cigarettes, Cigars   Smokeless tobacco: Never   Tobacco comments:    10-15 cigarettes daily/Cigar every now and then  Vaping Use   Vaping Use: Never used  Substance Use Topics   Alcohol use: No    Alcohol/week: 0.0 standard drinks   Drug use: No    Home Medications Prior to Admission medications   Medication Sig Start Date End Date Taking? Authorizing Provider  acetaminophen (TYLENOL) 325 MG tablet Take 325 mg by mouth every 6 (six) hours as needed (pain).    Yes [provider]  albuterol (VENTOLIN HFA) 108 (90 Base) MCG/ACT inhaler Inhale 2 puffs into the lungs every 4 (four) hours as needed for shortness of breath. 07/24/20  Yes [provider]  aspirin EC 81 MG tablet Take 1 tablet (81  mg total) by mouth daily. 05/26/14  Yes Barrett, Erin R, PA-C  cephALEXin (KEFLEX) 500 MG capsule Take 1 capsule (500 mg total) by mouth 4 (four) times daily for 7 days. 09/29/20 10/06/20 Yes Ghimire, Dante Gang, MD  diltiazem (CARDIZEM CD) 120 MG 24 hr capsule Take 1 capsule (120 mg total) by mouth at bedtime. 08/22/20  Yes Freada Bergeron, MD  levothyroxine (SYNTHROID) 75 MCG tablet TAKE 1 TABLET (75 MCG TOTAL) BY MOUTH DAILY BEFORE BREAKFAST. 03/15/19  Yes Libby Maw, MD  loratadine (CLARITIN) 10 MG tablet Take 1 tablet (10 mg total) by mouth daily. 09/10/15  Yes Dorothy Spark, MD  MELATONIN GUMMIES PO Take 1-2 tablets by mouth at bedtime as needed (sleep).   Yes [provider]  metFORMIN (GLUCOPHAGE) 500 MG tablet Take 500 mg by mouth daily with breakfast. 01/18/20  Yes [provider]  Omega-3 Fatty Acids (FISH OIL) 1000 MG CAPS Take 1,000 mg by mouth daily.   Yes [provider]  sertraline (ZOLOFT) 50 MG tablet Take 50 mg by mouth daily. 05/02/19  Yes [provider]  warfarin (COUMADIN) 5 MG tablet TAKE 1 TO 1.5 TABLETS DAILY AS DIRECTED BY ANTICOAGULATION CLINIC 01/01/20  Yes Dorothy Spark, MD  Blood Glucose Monitoring Suppl (ONE TOUCH ULTRA MINI) w/Device KIT Use meter to check blood sugars 1-4 times daily as instructed. 10/22/16   [place], FNP  Continuous Blood Gluc Receiver (FREESTYLE LIBRE 2 READER) DEVI SCAN AS NEEDED FOR CONTINUOUS GLUCOSE MONITORING. 12/20/19   [provider]  Continuous Blood Gluc Sensor (FREESTYLE LIBRE 2 SENSOR) MISC USE AS DIRECTED EVERY 14 DAYS 04/24/20   [provider]  glucose blood (ONE TOUCH ULTRA TEST) test strip Use one strip per test. Test blood sugars 1-4 times daily as instructed. 09/22/18   Libby Maw, MD  Lancets [facility] DELICA PLUS MEQAST41D) MISC USE 1 LANCET PER TEST. TEST BLOOD SUGARS 1 4 TIMES PER DAY AS INSTRUCTED. 09/22/18   [provider]  OneTouch  Delica Lancets 62I MISC USE TO TEST BLOOD SUGARS 1-4 TIMES PER DAY AS INSTRUCTED 01/23/19   Libby Maw, MD    Allergies    Contrast media [iodinated diagnostic  agents], Ioxaglate, Atorvastatin, Crestor [rosuvastatin], Fenofibrate, Pravastatin, and Pseudoephedrine hcl  Review of Systems   Review of Systems  Constitutional:  Negative for appetite change and fatigue.  HENT:  Negative for congestion, ear discharge and sinus pressure.   Eyes:  Negative for discharge.  Respiratory:  Negative for cough.   Cardiovascular:  Negative for chest pain.  Gastrointestinal:  Negative for abdominal pain and diarrhea.  Genitourinary:  Negative for frequency and hematuria.       Hematuria  Musculoskeletal:  Negative for back pain.  Skin:  Negative for rash.  Neurological:  Positive for weakness. Negative for seizures and headaches.  Psychiatric/Behavioral:  Negative for hallucinations.    Physical Exam Updated Vital Signs BP (!) 161/84 (BP Location: Right Arm)   Pulse 70   Temp 99.3 F (37.4 C) (Oral)   Resp 16   Ht 5' 8"  (1.727 m)   Wt 90.2 kg   SpO2 98%   BMI 30.24 kg/m   Physical Exam Vitals and nursing note reviewed.  Constitutional:      Appearance: He is well-developed.  HENT:     Head: Normocephalic.     Nose: Nose normal.  Eyes:     General: No scleral icterus.    Conjunctiva/sclera: Conjunctivae normal.  Neck:     Thyroid: No thyromegaly.  Cardiovascular:     Rate and Rhythm: Normal rate and regular rhythm.     Heart sounds: No murmur heard.   No friction rub. No gallop.  Pulmonary:     Breath sounds: No stridor. No wheezing or rales.  Chest:     Chest wall: No tenderness.  Abdominal:     General: There is no distension.     Tenderness: There is no abdominal tenderness. There is no rebound.  Musculoskeletal:        General: Normal range of motion.     Cervical back: Neck supple.  Lymphadenopathy:     Cervical: No cervical adenopathy.  Skin:    Findings:  No erythema or rash.  Neurological:     Mental Status: He is alert.     Motor: No abnormal muscle tone.     Coordination: Coordination normal.     Comments: Mild confusion  Psychiatric:        Behavior: Behavior normal.    ED Results / Procedures / Treatments   Labs (all labs ordered are listed, but only abnormal results are displayed) Labs Reviewed  URINE CULTURE - Abnormal; Notable for the following components:      Result Value   Culture >=100,000 COLONIES/mL ESCHERICHIA COLI (*)    Organism ID, Bacteria ESCHERICHIA COLI (*)    All other components within normal limits  BASIC METABOLIC PANEL - Abnormal; Notable for the following components:   CO2 20 (*)    Glucose, Bld 211 (*)    BUN 24 (*)    Creatinine, Ser 1.58 (*)    GFR, Estimated 48 (*)    All other components within normal limits  CBC - Abnormal; Notable for the following components:   WBC 26.0 (*)    All other components within normal limits  URINALYSIS, ROUTINE W REFLEX MICROSCOPIC - Abnormal; Notable for the following components:   APPearance HAZY (*)    Hgb urine dipstick LARGE (*)    Protein, ur 30 (*)    Leukocytes,Ua LARGE (*)    RBC / HPF >50 (*)    WBC, UA >50 (*)    Bacteria, UA MANY (*)  Non Squamous Epithelial 0-5 (*)    All other components within normal limits  PROTIME-INR - Abnormal; Notable for the following components:   Prothrombin Time 29.6 (*)    INR 2.8 (*)    All other components within normal limits  CBC - Abnormal; Notable for the following components:   WBC 23.8 (*)    All other components within normal limits  BASIC METABOLIC PANEL - Abnormal; Notable for the following components:   Glucose, Bld 172 (*)    Creatinine, Ser 1.60 (*)    GFR, Estimated 47 (*)    All other components within normal limits  PROTIME-INR - Abnormal; Notable for the following components:   Prothrombin Time 31.5 (*)    INR 3.0 (*)    All other components within normal limits  GLUCOSE, CAPILLARY -  Abnormal; Notable for the following components:   Glucose-Capillary 267 (*)    All other components within normal limits  GLUCOSE, CAPILLARY - Abnormal; Notable for the following components:   Glucose-Capillary 225 (*)    All other components within normal limits  GLUCOSE, CAPILLARY - Abnormal; Notable for the following components:   Glucose-Capillary 320 (*)    All other components within normal limits  GLUCOSE, CAPILLARY - Abnormal; Notable for the following components:   Glucose-Capillary 184 (*)    All other components within normal limits  PROTIME-INR - Abnormal; Notable for the following components:   Prothrombin Time 26.0 (*)    INR 2.4 (*)    All other components within normal limits  BASIC METABOLIC PANEL - Abnormal; Notable for the following components:   Glucose, Bld 144 (*)    BUN 26 (*)    Creatinine, Ser 1.67 (*)    GFR, Estimated 45 (*)    All other components within normal limits  CBC WITH DIFFERENTIAL/PLATELET - Abnormal; Notable for the following components:   WBC 23.8 (*)    Neutro Abs 18.6 (*)    Monocytes Absolute 2.3 (*)    Abs Immature Granulocytes 0.23 (*)    All other components within normal limits  PHOSPHORUS - Abnormal; Notable for the following components:   Phosphorus 2.1 (*)    All other components within normal limits  GLUCOSE, CAPILLARY - Abnormal; Notable for the following components:   Glucose-Capillary 163 (*)    All other components within normal limits  GLUCOSE, CAPILLARY - Abnormal; Notable for the following components:   Glucose-Capillary 148 (*)    All other components within normal limits  GLUCOSE, CAPILLARY - Abnormal; Notable for the following components:   Glucose-Capillary 225 (*)    All other components within normal limits  CULTURE, BLOOD (ROUTINE X 2)  CULTURE, BLOOD (ROUTINE X 2)  RESP PANEL BY RT-PCR (FLU A&B, COVID) ARPGX2  PROCALCITONIN  HIV ANTIBODY (ROUTINE TESTING W REFLEX)  MAGNESIUM  CBG MONITORING, ED     EKG  No results found.  Procedures Procedures   Medications Ordered in ED Medications  sodium chloride 0.9 % bolus 1,000 mL (0 mLs Intravenous Stopped 09/27/20 1553)  ondansetron (ZOFRAN) injection 4 mg (4 mg Intravenous Given 09/27/20 1405)  meclizine (ANTIVERT) tablet 25 mg (25 mg Oral Given 09/27/20 1405)  cefTRIAXone (ROCEPHIN) 1 g in sodium chloride 0.9 % 100 mL IVPB (1 g Intravenous New Bag/Given 09/27/20 1548)  warfarin (COUMADIN) tablet 2.5 mg (2.5 mg Oral Given 09/27/20 2015)  warfarin (COUMADIN) tablet 2.5 mg (2.5 mg Oral Given 09/28/20 1700)  cefTRIAXone (ROCEPHIN) 1 g in sodium chloride 0.9 % 100  mL IVPB (0 g Intravenous Stopped 09/29/20 1542)    ED Course  I have reviewed the triage vital signs and the nursing notes.  Pertinent labs & imaging results that were available during my care of the patient were reviewed by me and considered in my medical decision making (see chart for details).    MDM Rules/Calculators/A&P                          Patient with urinary tract infection.  He will be admitted to medicine Final Clinical Impression(s) / ED Diagnoses Final diagnoses:  Gross hematuria  Controlled type 2 diabetes mellitus with stage 3 chronic kidney disease, without long-term current use of insulin ([place])    Rx / DC Orders ED Discharge Orders          Ordered    Increase activity slowly        09/29/20 1046    Diet Carb Modified        09/29/20 1046    Diet - low sodium heart healthy        09/29/20 1046    cephALEXin (KEFLEX) 500 MG capsule  4 times daily        09/29/20 1157             Milton Ferguson, MD 10/05/20 [phone]

## 2020-10-07 DIAGNOSIS — G4733 Obstructive sleep apnea (adult) (pediatric): Secondary | ICD-10-CM | POA: Diagnosis not present

## 2020-10-08 ENCOUNTER — Telehealth: Payer: Self-pay | Admitting: Cardiology

## 2020-10-08 NOTE — Telephone Encounter (Signed)
Wife was calling to give [place] an update on the pt.  He was recently admitted to the hospital for blood in his urine.  Pt is on warfarin.  He did see our coumadin clinic for follow-up of this last week, and was dosed then and has a recheck with them on 8/23. Wife states they advised for him to hold his valsartan at discharge until he see's [place] in clinic on 8/4. Wife states his BP is running around 130/90. Pt is taking antibiotics for UTI, which was initiated in the hospital.  He is almost complete with taking this med.  Advised the pts wife to have him follow all discharge instructions from the hospital, continue his antibiotic as prescribed, warfarin as directed, and follow-up with coumadin clinic and Dr. Johney Frame as planned.  Wife verbalized understanding and agrees with this plan. Wife was more than gracious for all the assistance provided.

## 2020-10-08 NOTE — Telephone Encounter (Signed)
Santiago Glad is calling to get a callback from the nurse in regards to her husbands condition

## 2020-10-08 NOTE — Telephone Encounter (Signed)
Left the pts wife Santiago Glad (on [place]) a message to call the office back.

## 2020-10-09 DIAGNOSIS — N183 Chronic kidney disease, stage 3 unspecified: Secondary | ICD-10-CM | POA: Diagnosis not present

## 2020-10-10 ENCOUNTER — Ambulatory Visit (INDEPENDENT_AMBULATORY_CARE_PROVIDER_SITE_OTHER): Payer: PPO

## 2020-10-10 DIAGNOSIS — I442 Atrioventricular block, complete: Secondary | ICD-10-CM

## 2020-10-14 LAB — CUP PACEART REMOTE DEVICE CHECK
Battery Impedance: 377 Ohm
Battery Remaining Longevity: 104 mo
Battery Voltage: 2.78 V
Brady Statistic AP VP Percent: 0 %
Brady Statistic AP VS Percent: 51 %
Brady Statistic AS VP Percent: 0 %
Brady Statistic AS VS Percent: 48 %
Date Time Interrogation Session: 20220722114718
Implantable Lead Implant Date: 20160301
Implantable Lead Implant Date: 20160301
Implantable Lead Location: 753859
Implantable Lead Location: 753860
Implantable Lead Model: 5076
Implantable Lead Model: 5076
Implantable Pulse Generator Implant Date: 20160301
Lead Channel Impedance Value: 443 Ohm
Lead Channel Impedance Value: 659 Ohm
Lead Channel Pacing Threshold Amplitude: 0.625 V
Lead Channel Pacing Threshold Amplitude: 0.625 V
Lead Channel Pacing Threshold Pulse Width: 0.4 ms
Lead Channel Pacing Threshold Pulse Width: 0.4 ms
Lead Channel Setting Pacing Amplitude: 2 V
Lead Channel Setting Pacing Amplitude: 2.5 V
Lead Channel Setting Pacing Pulse Width: 0.4 ms
Lead Channel Setting Sensing Sensitivity: 4 mV

## 2020-10-18 NOTE — Progress Notes (Deleted)
Cardiology Office Note:    Date:  10/18/2020   ID:  Kevin English, DOB [birth year], MRN [id number]  PCP:  Kevin English, [facility] Providers Cardiologist:  Kevin Dawley, MD (Inactive) {   Referring MD: Kevin Fritter, FNP   CC: Follow-up  History of Present Illness:    Kevin English is a 65 y.o. male with a hx of HTN, HLD intolerant to statin, prior smoker, pAfib, rheumatic heart disease with severe MS s/p MVR with  (Sorin [employer] Optiform bileaflet mechanical valve) and Maze Procedure with Left atrial lesion set using cryothermy and Clipping of Left Atrial Appendage with Dr. Roxy English in 2/16 , CHB s/p PPM placement, diastolic HF, depression, renal cell carcinoma, CKD and OSA who was previously followed by Kevin English who now presents for CV follow-up.  Per review of the record, the patient has history of severe MS s/p mechanical MVR with post-op course complicated by CHB. Last saw Kevin English on 07/18/19 where he was doing well from CV standpoint.  Last seen in clinic on 08/22/20 where he was struggling with constant fatigue and depression. Was trialed on multiple antidepressants with significant side effects. Given severity of symptoms, we decided to trial him off of metoprolol to see if this offers any benefit.  Today,      Past Medical History:  Diagnosis Date   Anxiety    Borderline diabetes    CAD (coronary artery disease)    a. By cath 08/2013: 70% distal cx-prox LPDA; tandem mod LAD lesions, o/w mild dz.   CKD (chronic kidney disease), stage II    his creat. runs 2-2.5; hasnt seen neph yet - sees Kevin English at [facility]   Complete heart block [facility])    a. s/p PPM    Deafness in left ear    Depression    Diabetes mellitus without complication ([place])    Diverticulitis of colon 15 years ago   Hernia    HTN (hypertension)    Hyperlipidemia    Hypogonadism male    Hypothyroidism    Lung nodule seen on imaging study 04/10/2014   5 mm nodule right lower lobe    Mitral stenosis    a. Dx 07/2013 - through workup of 2D echo, TEE, and cath, felt to be moderate.   Obesity    OSA on CPAP    Persistent atrial fibrillation (HCC)    a. sustained 160-180 on e-CARDIO monitor placed 07/29/2013. Placed on IV amiodarone at end of May 2015 due to continued paroxysms with RVR.   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     Renal mass    a. Dx 08/2013: concerning for renal cancer.   Rheumatic heart disease    S/P Minimally invasive maze operation for atrial fibrillation 05/15/2014   Left side lesion set using cryothermy with clipping of LA appendage   S/P minimally invasive mitral valve replacement with metallic valve and maze procedure 05/15/2014   20m Sorin Carbomedics Optiform mechanical valve placed via right mini thoracotomy approach   [place]   "MRI showed 3 mild strokes"    Past Surgical History:  Procedure Laterality Date   EYE SURGERY Right 1990   "reconstructive, lens implant- from paintball accident"   INGUINAL  HERNIA REPAIR Bilateral first one in 80's   "I had one side done twice"   LEFT AND RIGHT HEART CATHETERIZATION WITH CORONARY ANGIOGRAM N/A 08/28/2013   Procedure: LEFT AND RIGHT HEART CATHETERIZATION WITH CORONARY ANGIOGRAM;  Surgeon: Leonie Man, MD;  Location[facility] CATH LAB;  Service: Cardiovascular;  Laterality: N/A;   MINIMALLY INVASIVE MAZE PROCEDURE N/A 05/15/2014   Procedure: MINIMALLY INVASIVE MAZE PROCEDURE;  Surgeon: Rexene Alberts, MD;  Location: [place];  Service: Open Heart Surgery;  Laterality: N/A;   MITRAL VALVE REPLACEMENT Right 05/15/2014   Procedure: MINIMALLY INVASIVE MITRAL VALVE (MV) REPLACEMENT;  Surgeon: Rexene Alberts, MD;  Location: [place];  Service: Open Heart Surgery;  Laterality:  Right;   PERMANENT PACEMAKER INSERTION Left 05/22/2014   a. MDT dual chamber PPM implanted by Dr Rayann Heman for CHB s/p MAZE/MVR   ROBOT ASSISTED LAPAROSCOPIC NEPHRECTOMY Right 09/05/2013   Procedure: ROBOTIC ASSISTED LAPAROSCOPIC RIGHT RADICAL NEPHRECTOMY;  Surgeon: Sharyn Creamer, MD;  Location: [facility];  Service: Urology;  Laterality: Right;   TEE WITHOUT CARDIOVERSION N/A 08/16/2013   Procedure: TRANSESOPHAGEAL ECHOCARDIOGRAM (TEE);  Surgeon: Dorothy Spark, MD;  Location: [place];  Service: Cardiovascular;  Laterality: N/A;   TEE WITHOUT CARDIOVERSION N/A 05/15/2014   Procedure: TRANSESOPHAGEAL ECHOCARDIOGRAM (TEE);  Surgeon: Rexene Alberts, MD;  Location: [place];  Service: Open Heart Surgery;  Laterality: N/A;    Current Medications: No outpatient medications have been marked as taking for the 10/24/20 encounter (Appointment) with Freada Bergeron, MD.     Allergies:   Contrast media [iodinated diagnostic agents], Ioxaglate, Atorvastatin, Crestor [rosuvastatin], Fenofibrate, Pravastatin, and Pseudoephedrine hcl   Social History   Socioeconomic History   Marital status: Married    Spouse name: Not on file   Number of children: 2   Years of education: 16   Highest education level: Not on file  Occupational History   Occupation: Retired     Comment: Warehouse manager  Tobacco Use   Smoking status: Every Day    Packs/day: 0.75    Years: 50.00    Pack years: 37.50    Types: Cigarettes, Cigars   Smokeless tobacco: Never   Tobacco comments:    10-15 cigarettes daily/Cigar every now and then  Vaping Use   Vaping Use: Never used  Substance and Sexual Activity   Alcohol use: No    Alcohol/week: 0.0 standard drinks   Drug use: No   Sexual activity: Not Currently  Other Topics Concern   Not on file  Social History Narrative   Fun: Aggravate people.    Right hand    One story home   Social Determinants of Health   Financial Resource Strain: Not on file  Food Insecurity: Not  on file  Transportation Needs: Not on file  Physical Activity: Not on file  Stress: Not on file  Social Connections: Not on file     Family History: The patient's family history includes Diabetes in his brother; Healthy in his daughter and son; Heart attack in his mother; Heart disease in his brother and father; Hypertension in his mother and another family member; Stroke in his mother.  ROS:   Review of Systems  Constitutional:  Positive for malaise/fatigue. Negative for weight loss.  HENT:  Negative for hearing loss and nosebleeds.   Eyes:  Negative for photophobia and redness.  Respiratory:  Negative for cough and wheezing.   Cardiovascular:  Negative for chest pain, palpitations, orthopnea, claudication, leg swelling and PND.  Gastrointestinal:  Negative for constipation, diarrhea and heartburn.  Genitourinary:  Negative for flank pain and urgency.  Musculoskeletal:  Negative for back pain and joint pain.  Neurological:  Negative for dizziness and speech change.  Endo/Heme/Allergies:  Negative for polydipsia.  Psychiatric/Behavioral:  Positive for depression. Negative for hallucinations.     EKGs/Labs/Other Studies Reviewed:    The following studies were reviewed today: Carotid Ultrasound 07/04/20: Right Carotid: Velocities in the right ICA are consistent with a 1-39%  stenosis.                 Essentially stable RICA velocities.   Left Carotid: Velocities in the left ICA are consistent with a 1-39%  stenosis.                Essentially stable LICA velocities.   Echo 7/16 Mild LVH, EF 50-55%, normal wall motion, trivial AI, mechanical MVR with normal function, mean gradient 4 mmHg, mild to moderate LAE, normal RV function   Echo 4/16 Mild LVH, EF 45-50%, apical dyskinesis, trivial AI, mechanical MVR okay, severe LAE   Carotid US 04/16/14 Bilateral - 1% to 39% ICA stensosis.   TEE [id number] Normal systolic function, normal wall motion, mild AI, moderate LAE Impressions:   Rheumatic mitral valve with moderate leaflet tip thickening and minimal calcifications. Moderate mitral stenosis and trace mitral regurgitation.   TTE: 03/2018   Left ventricle: The cavity size was normal. There was moderate   concentric hypertrophy. Systolic function was normal. The   estimated ejection fraction was in the range of 60% to 65%. Features are consistent with a pseudonormal left   ventricular filling pattern, with concomitant abnormal relaxation   and increased filling pressure (grade 2 diastolic dysfunction).   - Mitral valve: Transvalvular velocity was within the normal range.   There was no evidence for stenosis. There was no regurgitation.  - Left atrium: The atrium was moderately dilated. - Pulmonary arteries: Systolic pressure was within the normal   range. PA peak pressure: 35 mm Hg (S).   Cardiac cath 08/28/13 LM:  Normal LAD:  Proximal 40-50%, distal 50-60% LCx:  Small OM 70-80% (not amenable to PCI or CABG), PDA 70% LPDA:  10-20% RCA:  Mid 30-40%  EKG:   ECG***  Recent Labs: 09/29/2020: BUN 26; Creatinine, Ser 1.67; Hemoglobin 15.5; Magnesium 2.2; Platelets 222; Potassium 3.9; Sodium 139  Recent Lipid Panel    Component Value Date/Time   CHOL 160 04/22/2017 0852   TRIG 307 (H) 04/22/2017 0852   HDL 22 (L) 04/22/2017 0852   CHOLHDL 7.3 (H) 04/22/2017 0852   CHOLHDL 8.4 (H) 03/19/2015 0756   VLDL 54 (H) 03/19/2015 0756   LDLCALC 77 04/22/2017 0852   LDLDIRECT 62.0 12/13/2014 0845     Risk Assessment/Calculations:    CHA2DS2-VASc Score = 4  This indicates a 4.8% annual risk of stroke. The patient's score is based upon: CHF History: Yes HTN History: Yes Diabetes History: No Stroke History: No Vascular Disease History: Yes Age Score: 1 Gender Score: 0    Physical Exam:    VS:  There were no vitals taken for this visit.    Wt Readings from Last 3 Encounters:  09/29/20 198 lb 13.7 oz (90.2 kg)  08/22/20 198 lb 12.8 oz (90.2 kg)  05/15/20 197  lb 9.6 oz (89.6 kg)     GEN: Well nourished, well developed in no acute distress, congenitally hard of hearing HEENT: Normal NECK: No JVD; No carotid bruits CARDIAC: RRR, crisp mechanical  click, no rubs or gallops RESPIRATORY:  Clear to auscultation without rales, wheezing or rhonchi  ABDOMEN: Soft, non-tender, non-distended MUSCULOSKELETAL:  No edema; No deformity  SKIN: Warm and dry NEUROLOGIC:  Alert and oriented x 3 PSYCHIATRIC:  Depressed affect  ASSESSMENT:    No diagnosis found.  PLAN:    In order of problems listed above:  #Rheumatic Mitral Stenosis s/p Mechanical MVR: Patient is s/p MVR with Sorin Carbomedics Optiform bileaflet mechanical valve and Maze Procedure with Left atrial lesion set using cryothermy and Clipping of Left Atrial Appendage with Dr. Roxy English in 2/16 . Post-op course c/b CHB s/p PPM placement. TTE 03/2018 with normal LVEF, well functioning MVR, normal gradients. -Continue warfarin -Surveillance TTE in 1-2 years  -Needs lifelong IE ppx  #Paroxysmal Afib s/p MAZE and LAA clipping: Maintaining NSR. Follows in Afib clinic with Roderic Palau. On warfarin for rheumatic MS. -Continue warfarin -Off metop and transitioned to dilt due to significant fatigue and depression  #Chronic Diastolic HF: Euvolemic and stable on exam. Currently with NYHA class I-II symptoms. -Not on diuretics -BP management as below -Low Na diet  #CHB s/p PPM placement: Developed following MVR. -Follow-up with Dr. Rayann Heman as scheduled  #HLD: #Statin intolerance Controlled with LDL 77 in 2019 -Continue zetia '10mg'$  daily -Continue lovaza 2gm BID -Repeat lipids at next visit  #HTN: Controlled at home. Given that we want to trial off of metop due to persistent fatigue/depression, will change amlodipine to valsartan and start dilt for Afib. -Continue dilt '120mg'$  XL daily -Continue valsartan '40mg'$  daily  #Mild nonobstructive CAD: -Continue ASA '81mg'$  daily -Continue zetia '10mg'$   daily -Continue lovaza 2gm BID -Healthy diet and exercise  #OSA: -Compliant with CPAP   #Depression: #Chronic Fatigue: Patient has had significant depression and chronic fatigue since his surgery. Has had extensive work-up which has been unrevealing. Did not tolerate many of the antidepressants. We discussed his medications at length today and we can see if trialing off metop helps at all with symptoms and change him to dilt in the interim. This likely will not make a huge difference, but it is worth trying given the pervasiveness of his fatigue. -Metop stopped and transitioned to dilt    Medication Adjustments/Labs and Tests Ordered: Current medicines are reviewed at length with the patient today.  Concerns regarding medicines are outlined above.  No orders of the defined types were placed in this encounter.  No orders of the defined types were placed in this encounter.   There are no Patient Instructions on file for this visit.    I,Mathew Stumpf,acting as a Education administrator for Freada Bergeron, MD.,have documented all relevant documentation on the behalf of Freada Bergeron, MD,as directed by  Freada Bergeron, MD while in the presence of Freada Bergeron, MD.  I, Freada Bergeron, MD, have reviewed all documentation for this visit. The documentation on 10/18/20 for the exam, diagnosis, procedures, and orders are all accurate and complete.  Signed, Freada Bergeron, MD  10/18/2020 7:49 AM    [facility]

## 2020-10-23 ENCOUNTER — Telehealth: Payer: Self-pay | Admitting: Cardiology

## 2020-10-23 NOTE — Telephone Encounter (Signed)
Spoke with the patient and his wife. They report that the patient discontinued amlodipine and metoprolol due to fatigue. He was started on diltiazem and valsartan. Shortly after this he was admitted to the hospital for urine in his blood. His valsartan was stopped but he has continued on diltiazem. Patient complains that ever since his legs have become very weak. He is unsteady on his feet. He states that he does get dizzy/lightheaded but this is not new for him. He took his blood pressure which was 170/98. Patient reports only drinking coffee this morning. Advised him to drink some water and rest and recheck his blood pressure in a couple of hours. Patient has not been keeping up with his blood pressure daily. Advised that he should be doing so along with keeping a log of heart rates.  Patient has been evaluated multiple times by neurology and they have not found anything wrong with his legs.  They would like to know if he can switch back to metoprolol and amlodipine to see if it helps with his leg weakness.

## 2020-10-23 NOTE — Telephone Encounter (Signed)
Wife of patient called.   Pt c/o medication issue:  1. Name of Medication: diltiazem (CARDIZEM CD) 120 MG 24 hr capsule  2. How are you currently taking this medication (dosage and times per day)? As directed   3. Are you having a reaction (difficulty breathing--STAT)? Unsteady on his feet  4. What is your medication issue? Wife is concerned that after starting this medication the patient is still unsteady on his feet   He was supposed to have an appointment with Dr. Johney Frame Thursday but she went on maternity leave. The wife wanted to know what to do regarding his BP medication.   He is scheduled to see Laurann Montana 11/29/20 at 9:00 am

## 2020-10-23 NOTE — Telephone Encounter (Signed)
Patient's wife advised to call back with a list of blood pressure readings so that any adjustment in medications can be determined.

## 2020-10-24 ENCOUNTER — Ambulatory Visit: Payer: PPO | Admitting: Cardiology

## 2020-10-25 NOTE — Telephone Encounter (Signed)
Spoke with pt's wife and they would prefer to resume the Metoprolol 25 mg 1/2 tab bid and Amlodipine 10 mg Per wife Dr Johney Frame made the changes thinking this was causing pt's leg issues but this has worsened with the change and B/P was much better controlled on other meds Will forward to Dr Gasper Sells for review and recommendations ./cy

## 2020-10-25 NOTE — Telephone Encounter (Signed)
Pt's wife aware to resume previous meds as discussed and stop Diltiazem ./cy

## 2020-10-25 NOTE — Telephone Encounter (Signed)
Pt c/o BP issue: STAT if pt c/o blurred vision, one-sided weakness or slurred speech  1. What are your last 5 BP readings?   08/03: 166/104 - 3:00 PM 177/105 - 8:00 PM  08/04: 142/92  08/05: 171/111  2. Are you having any other symptoms (ex. Dizziness, headache, blurred vision, passed out)?  Persistent weakness in legs  3. What is your BP issue?  Patient's wife is following up to report BP readings.

## 2020-11-01 NOTE — Progress Notes (Signed)
Remote pacemaker transmission.   

## 2020-11-12 ENCOUNTER — Ambulatory Visit (INDEPENDENT_AMBULATORY_CARE_PROVIDER_SITE_OTHER): Payer: PPO

## 2020-11-12 ENCOUNTER — Other Ambulatory Visit: Payer: Self-pay

## 2020-11-12 DIAGNOSIS — I480 Paroxysmal atrial fibrillation: Secondary | ICD-10-CM

## 2020-11-12 DIAGNOSIS — Z5181 Encounter for therapeutic drug level monitoring: Secondary | ICD-10-CM | POA: Diagnosis not present

## 2020-11-12 LAB — POCT INR: INR: 3.6 — AB (ref 2.0–3.0)

## 2020-11-12 NOTE — Patient Instructions (Signed)
Description   Eat greens tonight and continue 1.5 tablets daily except 1 tablet each Monday, Wednesday and Friday. Repeat INR in 6 weeks.. [phone]

## 2020-11-29 ENCOUNTER — Ambulatory Visit (HOSPITAL_BASED_OUTPATIENT_CLINIC_OR_DEPARTMENT_OTHER): Payer: PPO | Admitting: Family

## 2020-12-24 ENCOUNTER — Ambulatory Visit (INDEPENDENT_AMBULATORY_CARE_PROVIDER_SITE_OTHER): Payer: PPO

## 2020-12-24 ENCOUNTER — Other Ambulatory Visit: Payer: Self-pay

## 2020-12-24 DIAGNOSIS — I480 Paroxysmal atrial fibrillation: Secondary | ICD-10-CM

## 2020-12-24 DIAGNOSIS — Z5181 Encounter for therapeutic drug level monitoring: Secondary | ICD-10-CM

## 2020-12-24 LAB — POCT INR: INR: 3.4 — AB (ref 2.0–3.0)

## 2020-12-24 NOTE — Patient Instructions (Signed)
Description   Continue 1.5 tablets daily except 1 tablet each Monday, Wednesday and Friday. Repeat INR in 6 weeks. [phone]

## 2021-01-08 DIAGNOSIS — G4733 Obstructive sleep apnea (adult) (pediatric): Secondary | ICD-10-CM | POA: Diagnosis not present

## 2021-01-09 ENCOUNTER — Ambulatory Visit (INDEPENDENT_AMBULATORY_CARE_PROVIDER_SITE_OTHER): Payer: PPO

## 2021-01-09 DIAGNOSIS — I442 Atrioventricular block, complete: Secondary | ICD-10-CM

## 2021-01-12 LAB — CUP PACEART REMOTE DEVICE CHECK
Battery Impedance: 401 Ohm
Battery Remaining Longevity: 102 mo
Battery Voltage: 2.78 V
Brady Statistic AP VP Percent: 0 %
Brady Statistic AP VS Percent: 51 %
Brady Statistic AS VP Percent: 0 %
Brady Statistic AS VS Percent: 49 %
Date Time Interrogation Session: 20221021112632
Implantable Lead Implant Date: 20160301
Implantable Lead Implant Date: 20160301
Implantable Lead Location: 753859
Implantable Lead Location: 753860
Implantable Lead Model: 5076
Implantable Lead Model: 5076
Implantable Pulse Generator Implant Date: 20160301
Lead Channel Impedance Value: 475 Ohm
Lead Channel Impedance Value: 671 Ohm
Lead Channel Pacing Threshold Amplitude: 0.625 V
Lead Channel Pacing Threshold Amplitude: 0.625 V
Lead Channel Pacing Threshold Pulse Width: 0.4 ms
Lead Channel Pacing Threshold Pulse Width: 0.4 ms
Lead Channel Setting Pacing Amplitude: 2 V
Lead Channel Setting Pacing Amplitude: 2.5 V
Lead Channel Setting Pacing Pulse Width: 0.4 ms
Lead Channel Setting Sensing Sensitivity: 5.6 mV

## 2021-01-15 DIAGNOSIS — N183 Chronic kidney disease, stage 3 unspecified: Secondary | ICD-10-CM | POA: Diagnosis not present

## 2021-01-16 NOTE — Progress Notes (Signed)
Remote pacemaker transmission.   

## 2021-01-31 DIAGNOSIS — F32A Depression, unspecified: Secondary | ICD-10-CM | POA: Diagnosis not present

## 2021-01-31 DIAGNOSIS — I129 Hypertensive chronic kidney disease with stage 1 through stage 4 chronic kidney disease, or unspecified chronic kidney disease: Secondary | ICD-10-CM | POA: Diagnosis not present

## 2021-01-31 DIAGNOSIS — Z952 Presence of prosthetic heart valve: Secondary | ICD-10-CM | POA: Diagnosis not present

## 2021-01-31 DIAGNOSIS — I4891 Unspecified atrial fibrillation: Secondary | ICD-10-CM | POA: Diagnosis not present

## 2021-01-31 DIAGNOSIS — Z8679 Personal history of other diseases of the circulatory system: Secondary | ICD-10-CM | POA: Diagnosis not present

## 2021-01-31 DIAGNOSIS — C649 Malignant neoplasm of unspecified kidney, except renal pelvis: Secondary | ICD-10-CM | POA: Diagnosis not present

## 2021-01-31 DIAGNOSIS — N183 Chronic kidney disease, stage 3 unspecified: Secondary | ICD-10-CM | POA: Diagnosis not present

## 2021-01-31 DIAGNOSIS — E1122 Type 2 diabetes mellitus with diabetic chronic kidney disease: Secondary | ICD-10-CM | POA: Diagnosis not present

## 2021-01-31 DIAGNOSIS — N390 Urinary tract infection, site not specified: Secondary | ICD-10-CM | POA: Diagnosis not present

## 2021-01-31 DIAGNOSIS — Z950 Presence of cardiac pacemaker: Secondary | ICD-10-CM | POA: Diagnosis not present

## 2021-01-31 DIAGNOSIS — Z7901 Long term (current) use of anticoagulants: Secondary | ICD-10-CM | POA: Diagnosis not present

## 2021-02-04 ENCOUNTER — Ambulatory Visit (INDEPENDENT_AMBULATORY_CARE_PROVIDER_SITE_OTHER): Payer: PPO

## 2021-02-04 ENCOUNTER — Other Ambulatory Visit: Payer: Self-pay

## 2021-02-04 DIAGNOSIS — Z5181 Encounter for therapeutic drug level monitoring: Secondary | ICD-10-CM

## 2021-02-04 DIAGNOSIS — I480 Paroxysmal atrial fibrillation: Secondary | ICD-10-CM

## 2021-02-04 LAB — POCT INR: INR: 1.9 — AB (ref 2.0–3.0)

## 2021-02-04 NOTE — Patient Instructions (Signed)
Description   Take 2 tablets today and 2 tablets tomorrow and then continue 1.5 tablets daily except 1 tablet each Monday, Wednesday and Friday. Repeat INR in 6 weeks. [phone]

## 2021-03-18 ENCOUNTER — Ambulatory Visit (INDEPENDENT_AMBULATORY_CARE_PROVIDER_SITE_OTHER): Payer: PPO

## 2021-03-18 ENCOUNTER — Other Ambulatory Visit: Payer: Self-pay

## 2021-03-18 DIAGNOSIS — Z0000 Encounter for general adult medical examination without abnormal findings: Secondary | ICD-10-CM

## 2021-03-18 DIAGNOSIS — I5033 Acute on chronic diastolic (congestive) heart failure: Secondary | ICD-10-CM

## 2021-03-18 DIAGNOSIS — I480 Paroxysmal atrial fibrillation: Secondary | ICD-10-CM

## 2021-03-18 DIAGNOSIS — Z954 Presence of other heart-valve replacement: Secondary | ICD-10-CM

## 2021-03-18 DIAGNOSIS — I050 Rheumatic mitral stenosis: Secondary | ICD-10-CM | POA: Diagnosis not present

## 2021-03-18 DIAGNOSIS — I4891 Unspecified atrial fibrillation: Secondary | ICD-10-CM

## 2021-03-18 LAB — POCT INR: INR: 2 (ref 2.0–3.0)

## 2021-03-18 NOTE — Patient Instructions (Signed)
Description   Take 2 tablets today and 2 tablets tomorrow and then continue 1.5 tablets daily except 1 tablet each Monday, Wednesday and Friday. Repeat INR in 5 weeks (Per pt request. Pt made aware of risk and educated on the importance of coming for next INR check sooner). [phone]

## 2021-04-03 NOTE — Progress Notes (Signed)
Cardiology Office Note:    Date:  04/04/2021   ID:  Kevin English, DOB 16-Jan-1956, MRN [id number]  PCP:  Gweneth Fritter, [place] Prov[facility] Cardiologist:  Ena Dawley, MD {   Referring MD: Gweneth Fritter, FNP   CC: Follow-up  History of Present Illness:    Kevin English is a 66 y.o. male with a hx of HTN, HLD intolerant to statin, prior smoker, pAfib, rheumatic heart disease with severe MS s/p MVR with  (Sorin Carbomedics Optiform bileaflet mechanical valve) and Maze Procedure with Left atrial lesion set using cryothermy and Clipping of Left Atrial Appendage with Dr. Roxy Manns in 2/16 , CHB s/p PPM placement, diastolic HF, depression, renal cell carcinoma, CKD and OSA who was previously followed by Dr. [name] who now presents for CV follow-up.  Per review of the record, the patient has history of severe MS s/p mechanical MVR with post-op course complicated by CHB. Last saw Dr. [name] on 07/18/19 where he was doing well from CV standpoint.  During last visit on 08/22/20 where he was struggling with chronic fatigue and depression. Was not responding to antidepressants and thorough work-up of fatigue was unrevealing. We decided to trial him off the metop to see if that helped.  Today, the patient states he did not tolerate the diltiazem well. He developed blood in the urine and felt awful. Has transitioned back to metop and amlodipine. Continues to suffer significantly with depression which has been ongoing for years. No chest pain, palpitations, LE edema, orthopnea or PND. Tolerating warfarin with no bleeding issues.    Past Medical History:  Diagnosis Date   Anxiety    Borderline diabetes    CAD (coronary artery disease)    a. By cath 08/2013: 70% distal cx-prox LPDA; tandem mod LAD lesions, o/w mild dz.   CKD (chronic kidney disease), stage II    his creat. runs 2-2.5; hasnt seen neph yet - sees dr. Tresa Moore at [facility]   Complete heart block [facility])    a. s/p PPM     Deafness in left ear    Depression    Diabetes mellitus without complication ([place])    Diverticulitis of colon 15 years ago   Hernia    HTN (hypertension)    Hyperlipidemia    Hypogonadism male    Hypothyroidism    Lung nodule seen on imaging study 04/10/2014   5 mm nodule right lower lobe   Mitral stenosis    a. Dx 07/2013 - through workup of 2D echo, TEE, and cath, felt to be moderate.   Obesity    OSA on CPAP    Persistent atrial fibrillation (HCC)    a. sustained 160-180 on e-CARDIO monitor placed 07/29/2013. Placed on IV amiodarone at end of May 2015 due to continued paroxysms with RVR.   Renal cell carcinoma of right kidney ([place]) 09/05/2013   clear cell renal cell carcinoma of right kidney treated by radical nephrectomy, Fuhrman grade 2-3, with maximum tumor diameter 2.7 cm, all tumor to find confined to the kidney, and all surgical margins negative (T1aN0).     Renal mass    a. Dx 08/2013: concerning for renal cancer.   Rheumatic heart disease    S/P Minimally invasive maze operation for atrial fibrillation 05/15/2014   Left side lesion set using cryothermy with clipping of LA appendage   S/P minimally invasive mitral valve replacement with metallic valve and maze procedure 05/15/2014   67m Sorin Carbomedics Optiform mechanical  valve placed via right mini thoracotomy approach   Stroke [facility]) 1998   "MRI showed 3 mild strokes"    Past Surgical History:  Procedure Laterality Date   EYE SURGERY Right 1990   "reconstructive, lens implant- from paintball accident"   INGUINAL HERNIA REPAIR Bilateral first one in 42's   "I had one side done twice"   LEFT AND RIGHT HEART CATHETERIZATION WITH CORONARY ANGIOGRAM N/A 08/28/2013   Procedure: LEFT AND RIGHT HEART CATHETERIZATION WITH CORONARY ANGIOGRAM;  Surgeon: Leonie Man, MD;  Location: [facility] CATH LAB;  Service: Cardiovascular;  Laterality: N/A;   MINIMALLY INVASIVE MAZE PROCEDURE N/A 05/15/2014   Procedure: MINIMALLY INVASIVE MAZE  PROCEDURE;  Surgeon: Rexene Alberts, MD;  Location: [place];  Service: Open Heart Surgery;  Laterality: N/A;   MITRAL VALVE REPLACEMENT Right 05/15/2014   Procedure: MINIMALLY INVASIVE MITRAL VALVE (MV) REPLACEMENT;  Surgeon: Rexene Alberts, MD;  Location: [place];  Service: Open Heart Surgery;  Laterality: Right;   PERMANENT PACEMAKER INSERTION Left 05/22/2014   a. MDT dual chamber PPM implanted by Dr Rayann Heman for CHB s/p MAZE/MVR   ROBOT ASSISTED LAPAROSCOPIC NEPHRECTOMY Right 09/05/2013   Procedure: ROBOTIC ASSISTED LAPAROSCOPIC RIGHT RADICAL NEPHRECTOMY;  Surgeon: Sharyn Creamer, MD;  Location: [facility];  Service: Urology;  Laterality: Right;   TEE WITHOUT CARDIOVERSION N/A 08/16/2013   Procedure: TRANSESOPHAGEAL ECHOCARDIOGRAM (TEE);  Surgeon: Dorothy Spark, MD;  Location: [place];  Service: Cardiovascular;  Laterality: N/A;   TEE WITHOUT CARDIOVERSION N/A 05/15/2014   Procedure: TRANSESOPHAGEAL ECHOCARDIOGRAM (TEE);  Surgeon: Rexene Alberts, MD;  Location: [place];  Service: Open Heart Surgery;  Laterality: N/A;    Current Medications: Current Meds  Medication Sig   acetaminophen (TYLENOL) 325 MG tablet Take 325 mg by mouth every 6 (six) hours as needed (pain).    albuterol (VENTOLIN HFA) 108 (90 Base) MCG/ACT inhaler Inhale 2 puffs into the lungs every 4 (four) hours as needed for shortness of breath.   amLODipine (NORVASC) 10 MG tablet Take 10 mg by mouth daily.   aspirin EC 81 MG tablet Take 1 tablet (81 mg total) by mouth daily.   Blood Glucose Monitoring Suppl (ONE TOUCH ULTRA MINI) w/Device KIT Use meter to check blood sugars 1-4 times daily as instructed.   Continuous Blood Gluc Receiver (FREESTYLE LIBRE 2 READER) DEVI SCAN AS NEEDED FOR CONTINUOUS GLUCOSE MONITORING.   Continuous Blood Gluc Sensor (FREESTYLE LIBRE 2 SENSOR) MISC USE AS DIRECTED EVERY 14 DAYS   glucose blood (ONE TOUCH ULTRA TEST) test strip Use one strip per test. Test blood sugars 1-4 times daily as instructed.    Lancets (ONETOUCH DELICA PLUS [id number]) MISC USE 1 LANCET PER TEST. TEST BLOOD SUGARS 1 4 TIMES PER DAY AS INSTRUCTED.   levothyroxine (SYNTHROID) 75 MCG tablet TAKE 1 TABLET (75 MCG TOTAL) BY MOUTH DAILY BEFORE BREAKFAST.   loratadine (CLARITIN) 10 MG tablet Take 1 tablet (10 mg total) by mouth daily.   metFORMIN (GLUCOPHAGE) 500 MG tablet Take 500 mg by mouth daily with breakfast.   metoprolol tartrate (LOPRESSOR) 25 MG tablet Take 1 tablet by mouth 2 (two) times daily.   Omega-3 Fatty Acids (FISH OIL) 1000 MG CAPS Take 1,000 mg by mouth daily.   OneTouch Delica Lancets 47M MISC USE TO TEST BLOOD SUGARS 1-4 TIMES PER DAY AS INSTRUCTED   sertraline (ZOLOFT) 50 MG tablet Take 50 mg by mouth daily.   warfarin (COUMADIN) 5 MG tablet TAKE 1 TO 1.5 TABLETS DAILY  AS DIRECTED BY ANTICOAGULATION CLINIC     Allergies:   Contrast media [iodinated contrast media], Ioxaglate, Atorvastatin, Crestor [rosuvastatin], Fenofibrate, Pravastatin, and Pseudoephedrine hcl   Social History   Socioeconomic History   Marital status: Married    Spouse name: Not on file   Number of children: 2   Years of education: 16   Highest education level: Not on file  Occupational History   Occupation: Retired     Comment: Warehouse manager  Tobacco Use   Smoking status: Every Day    Packs/day: 0.75    Years: 50.00    Pack years: 37.50    Types: Cigarettes, Cigars   Smokeless tobacco: Never   Tobacco comments:    10-15 cigarettes daily/Cigar every now and then  Vaping Use   Vaping Use: Never used  Substance and Sexual Activity   Alcohol use: No    Alcohol/week: 0.0 standard drinks   Drug use: No   Sexual activity: Not Currently  Other Topics Concern   Not on file  Social History Narrative   Fun: Aggravate people.    Right hand    One story home   Social Determinants of Health   Financial Resource Strain: Not on file  Food Insecurity: Not on file  Transportation Needs: Not on file  Physical Activity: Not on  file  Stress: Not on file  Social Connections: Not on file     Family History: The patient's family history includes Diabetes in his brother; Healthy in his daughter and son; Heart attack in his mother; Heart disease in his brother and father; Hypertension in his mother and another family member; Stroke in his mother.  ROS:   Review of Systems  Constitutional:  Positive for malaise/fatigue. Negative for weight loss.  HENT:  Negative for hearing loss and nosebleeds.   Eyes:  Negative for photophobia and redness.  Respiratory:  Negative for cough and wheezing.   Cardiovascular:  Negative for chest pain, palpitations, orthopnea, claudication, leg swelling and PND.  Gastrointestinal:  Negative for blood in stool and melena.  Genitourinary:  Positive for hematuria. Negative for flank pain and urgency.  Musculoskeletal:  Negative for back pain and joint pain.  Neurological:  Negative for dizziness and loss of consciousness.  Psychiatric/Behavioral:  Positive for depression.     EKGs/Labs/Other Studies Reviewed:    The following studies were reviewed today: Echo 7/16 Mild LVH, EF 50-55%, normal wall motion, trivial AI, mechanical MVR with normal function, mean gradient 4 mmHg, mild to moderate LAE, normal RV function   Echo 4/16 Mild LVH, EF 45-50%, apical dyskinesis, trivial AI, mechanical MVR okay, severe LAE   Carotid US 04/16/14 Bilateral - 1% to 39% ICA stensosis.   TEE 7/74/12 Normal systolic function, normal wall motion, mild AI, moderate LAE Impressions:  Rheumatic mitral valve with moderate leaflet tip thickening and minimal calcifications. Moderate mitral stenosis and trace mitral regurgitation.   TTE: 03/2018   Left ventricle: The cavity size was normal. There was moderate   concentric hypertrophy. Systolic function was normal. The   estimated ejection fraction was in the range of 60% to 65%. Features are consistent with a pseudonormal left   ventricular filling pattern,  with concomitant abnormal relaxation   and increased filling pressure (grade 2 diastolic dysfunction).   - Mitral valve: Transvalvular velocity was within the normal range.   There was no evidence for stenosis. There was no regurgitation.  - Left atrium: The atrium was moderately dilated. - Pulmonary arteries: Systolic pressure  was within the normal   range. PA peak pressure: 35 mm Hg (S).   Cardiac cath 08/28/13 LM:  Normal LAD:  Proximal 40-50%, distal 50-60% LCx:  Small OM 70-80% (not amenable to PCI or CABG), PDA 70% LPDA:  10-20% RCA:  Mid 30-40%  EKG:   No new ECG  Recent Labs: 09/29/2020: BUN 26; Creatinine, Ser 1.67; Hemoglobin 15.5; Magnesium 2.2; Platelets 222; Potassium 3.9; Sodium 139  Recent Lipid Panel    Component Value Date/Time   CHOL 160 04/22/2017 0852   TRIG 307 (H) 04/22/2017 0852   HDL 22 (L) 04/22/2017 0852   CHOLHDL 7.3 (H) 04/22/2017 0852   CHOLHDL 8.4 (H) 03/19/2015 0756   VLDL 54 (H) 03/19/2015 0756   LDLCALC 77 04/22/2017 0852   LDLDIRECT 62.0 12/13/2014 0845     Risk Assessment/Calculations:    CHA2DS2-VASc Score = 4  This indicates a 4.8% annual risk of stroke. The patient's score is based upon: CHF History: 1 HTN History: 1 Diabetes History: 0 Stroke History: 0 Vascular Disease History: 1 Age Score: 1 Gender Score: 0    Physical Exam:    VS:  BP 124/84    Pulse 88    Ht _0  (1.727 m)    Wt 197 lb 9.6 oz (89.6 kg)    SpO2 96%    BMI 30.04 kg/m     Wt Readings from Last 3 Encounters:  04/04/21 197 lb 9.6 oz (89.6 kg)  09/29/20 198 lb 13.7 oz (90.2 kg)  08/22/20 198 lb 12.8 oz (90.2 kg)     GEN: Well nourished, well developed in no acute distress, congenitally hard of hearing HEENT: Normal NECK: No JVD; No carotid bruits CARDIAC: RRR, crisp mechanical clicks. No rubs or gallops. RESPIRATORY:  Rhonchorous that clears with coughing. Good air movement. ABDOMEN: Soft, non-tender, non-distended MUSCULOSKELETAL:  No edema; No  deformity  SKIN: Warm and dry NEUROLOGIC:  Alert and oriented x 3 PSYCHIATRIC:  Depressed affect  ASSESSMENT:    1. S/P mitral valve replacement with metallic valve   2. Paroxysmal atrial fibrillation   3. CHB (complete heart block) (HCC)   4. Coronary artery disease involving native coronary artery of native heart without angina pectoris   5. Essential hypertension   6. Chronic diastolic heart failure ([place])   7. Mitral valve stenosis, unspecified etiology   8. Acquired thrombophilia (HCC)    PLAN:    In order of problems listed above:  #Rheumatic Mitral Stenosis s/p Mechanical MVR: Patient is s/p MVR with Sorin Carbomedics Optiform bileaflet mechanical valve and Maze Procedure with Left atrial lesion set using cryothermy and Clipping of Left Atrial Appendage with Dr. Roxy Manns in 2/16 . Post-op course c/b CHB s/p PPM placement. TTE 03/2018 with normal LVEF, well functioning MVR, normal gradients. -Continue warfarin -Check surveillance TTE -Needs lifelong IE ppx  #Paroxysmal Afib s/p MAZE and LAA clipping: Maintaining NSR. Follows in Afib clinic with Roderic Palau. On warfarin for rheumatic MS. -Continue warfarin -Continue metop 56m BID; did not tolerate dilt  #Chronic Diastolic HF: Euvolemic and stable on exam. Currently with NYHA class I-II symptoms. -Not on diuretics -BP management as below -Low Na diet  #CHB s/p PPM placement: Developed following MVR. -Follow-up with EP scheduled  #HLD: #Statin intolerance Controlled with LDL 77 in 2019 -Continue zetia 186mdaily -Continue lovaza 2gm BID  #HTN: Controlled at home.  -Continue metop 2561mID -Continue valsartan 68m24mily -Continue amlodipine 10mg70mly  #Mild nonobstructive CAD: -Continue ASA 81mg 71my -  Continue zetia 64m daily -Continue lovaza 2gm BID -Healthy diet and exercise  #OSA: -Compliant with CPAP   #Depression: #Chronic Fatigue: Patient has had significant depression and chronic fatigue since  his surgery. Has had extensive work-up which has been unrevealing. Did not tolerate many of the antidepressants.      Medication Adjustments/Labs and Tests Ordered: Current medicines are reviewed at length with the patient today.  Concerns regarding medicines are outlined above.  Orders Placed This Encounter  Procedures   ECHOCARDIOGRAM COMPLETE   No orders of the defined types were placed in this encounter.   Patient Instructions  Medication Instructions:   Your physician recommends that you continue on your current medications as directed. Please refer to the Current Medication list given to you today.  *If you need a refill on your cardiac medications before your next appointment, please call your pharmacy*   Testing/Procedures:  Your physician has requested that you have an echocardiogram. Echocardiography is a painless test that uses sound waves to create images of your heart. It provides your doctor with information about the size and shape of your heart and how well your hearts chambers and valves are working. This procedure takes approximately one hour. There are no restrictions for this procedure.  Follow-Up: [facility] you and your health needs are our priority.  As part of our continuing mission to provide you with exceptional heart care, we have created designated Provider Care Teams.  These Care Teams include your primary Cardiologist (physician) and Advanced Practice Providers (APPs -  Physician Assistants and Nurse Practitioners) who all work together to provide you with the care you need, when you need it.  We recommend signing up for the patient portal called "MyChart".  Sign up information is provided on this After Visit Summary.  MyChart is used to connect with patients for Virtual Visits (Telemedicine).  Patients are able to view lab/test results, encounter notes, upcoming appointments, etc.  Non-urgent messages can be sent to your provider as well.   To  learn more about what you can do with MyChart, go to [email or web address]    Your next appointment:   6 month(s)  The format for your next appointment:   In Person  Provider:   DR. PJohney Frame     Signed, HFreada Bergeron MD  04/04/2021 10:27 AM    [place]

## 2021-04-04 ENCOUNTER — Encounter: Payer: Self-pay | Admitting: Cardiology

## 2021-04-04 ENCOUNTER — Ambulatory Visit: Payer: PPO | Admitting: Cardiology

## 2021-04-04 ENCOUNTER — Other Ambulatory Visit: Payer: Self-pay

## 2021-04-04 VITALS — BP 124/84 | HR 88 | Ht 68.0 in | Wt 197.6 lb

## 2021-04-04 DIAGNOSIS — I5032 Chronic diastolic (congestive) heart failure: Secondary | ICD-10-CM

## 2021-04-04 DIAGNOSIS — I442 Atrioventricular block, complete: Secondary | ICD-10-CM

## 2021-04-04 DIAGNOSIS — I480 Paroxysmal atrial fibrillation: Secondary | ICD-10-CM | POA: Diagnosis not present

## 2021-04-04 DIAGNOSIS — D6869 Other thrombophilia: Secondary | ICD-10-CM

## 2021-04-04 DIAGNOSIS — Z954 Presence of other heart-valve replacement: Secondary | ICD-10-CM | POA: Diagnosis not present

## 2021-04-04 DIAGNOSIS — I2510 Atherosclerotic heart disease of native coronary artery without angina pectoris: Secondary | ICD-10-CM | POA: Diagnosis not present

## 2021-04-04 DIAGNOSIS — I050 Rheumatic mitral stenosis: Secondary | ICD-10-CM

## 2021-04-04 DIAGNOSIS — I10 Essential (primary) hypertension: Secondary | ICD-10-CM

## 2021-04-04 NOTE — Patient Instructions (Signed)
Medication Instructions:  ° °Your physician recommends that you continue on your current medications as directed. Please refer to the Current Medication list given to you today. ° °*If you need a refill on your cardiac medications before your next appointment, please call your pharmacy* ° ° °Testing/Procedures: ° °Your physician has requested that you have an echocardiogram. Echocardiography is a painless test that uses sound waves to create images of your heart. It provides your doctor with information about the size and shape of your heart and how well your heart’s chambers and valves are working. This procedure takes approximately one hour. There are no restrictions for this procedure. ° ° °Follow-Up: °At [facility], you and your health needs are our priority.  As part of our continuing mission to provide you with exceptional heart care, we have created designated Provider Care Teams.  These Care Teams include your primary Cardiologist (physician) and Advanced Practice Providers (APPs -  Physician Assistants and Nurse Practitioners) who all work together to provide you with the care you need, when you need it. ° °We recommend signing up for the patient portal called "MyChart".  Sign up information is provided on this After Visit Summary.  MyChart is used to connect with patients for Virtual Visits (Telemedicine).  Patients are able to view lab/test results, encounter notes, upcoming appointments, etc.  Non-urgent messages can be sent to your provider as well.   °To learn more about what you can do with MyChart, go to [email or web address].   ° °Your next appointment:   °6 month(s) ° °The format for your next appointment:   °In Person ° °Provider:   °DR. PEMBERTON °

## 2021-04-10 ENCOUNTER — Ambulatory Visit (INDEPENDENT_AMBULATORY_CARE_PROVIDER_SITE_OTHER): Payer: PPO

## 2021-04-10 DIAGNOSIS — I442 Atrioventricular block, complete: Secondary | ICD-10-CM | POA: Diagnosis not present

## 2021-04-11 LAB — CUP PACEART REMOTE DEVICE CHECK
Battery Impedance: 401 Ohm
Battery Remaining Longevity: 101 mo
Battery Voltage: 2.78 V
Brady Statistic AP VP Percent: 0 %
Brady Statistic AP VS Percent: 51 %
Brady Statistic AS VP Percent: 0 %
Brady Statistic AS VS Percent: 49 %
Date Time Interrogation Session: 20230120134550
Implantable Lead Implant Date: 20160301
Implantable Lead Implant Date: 20160301
Implantable Lead Location: 753859
Implantable Lead Location: 753860
Implantable Lead Model: 5076
Implantable Lead Model: 5076
Implantable Pulse Generator Implant Date: 20160301
Lead Channel Impedance Value: 456 Ohm
Lead Channel Impedance Value: 676 Ohm
Lead Channel Pacing Threshold Amplitude: 0.625 V
Lead Channel Pacing Threshold Amplitude: 0.625 V
Lead Channel Pacing Threshold Pulse Width: 0.4 ms
Lead Channel Pacing Threshold Pulse Width: 0.4 ms
Lead Channel Setting Pacing Amplitude: 2 V
Lead Channel Setting Pacing Amplitude: 2.5 V
Lead Channel Setting Pacing Pulse Width: 0.4 ms
Lead Channel Setting Sensing Sensitivity: 5.6 mV

## 2021-04-15 ENCOUNTER — Other Ambulatory Visit: Payer: Self-pay

## 2021-04-15 ENCOUNTER — Ambulatory Visit (INDEPENDENT_AMBULATORY_CARE_PROVIDER_SITE_OTHER): Payer: PPO

## 2021-04-15 DIAGNOSIS — Z954 Presence of other heart-valve replacement: Secondary | ICD-10-CM | POA: Diagnosis not present

## 2021-04-15 LAB — ECHOCARDIOGRAM COMPLETE
Area-P 1/2: 2.72 cm2
MV VTI: 1.16 cm2
S' Lateral: 3.6 cm

## 2021-04-22 ENCOUNTER — Other Ambulatory Visit: Payer: Self-pay

## 2021-04-22 ENCOUNTER — Ambulatory Visit (INDEPENDENT_AMBULATORY_CARE_PROVIDER_SITE_OTHER): Payer: PPO

## 2021-04-22 DIAGNOSIS — Z5181 Encounter for therapeutic drug level monitoring: Secondary | ICD-10-CM | POA: Diagnosis not present

## 2021-04-22 DIAGNOSIS — I480 Paroxysmal atrial fibrillation: Secondary | ICD-10-CM

## 2021-04-22 LAB — POCT INR: INR: 2.6 (ref 2.0–3.0)

## 2021-04-22 NOTE — Patient Instructions (Addendum)
Description   Take 2 tablets today and then continue 1.5 tablets daily except 1 tablet each Monday, Wednesday and Friday. Repeat INR in 6 weeks. [phone]

## 2021-04-22 NOTE — Progress Notes (Signed)
Remote pacemaker transmission.   

## 2021-06-10 ENCOUNTER — Ambulatory Visit (INDEPENDENT_AMBULATORY_CARE_PROVIDER_SITE_OTHER): Payer: PPO

## 2021-06-10 ENCOUNTER — Other Ambulatory Visit: Payer: Self-pay

## 2021-06-10 DIAGNOSIS — I480 Paroxysmal atrial fibrillation: Secondary | ICD-10-CM

## 2021-06-10 DIAGNOSIS — Z5181 Encounter for therapeutic drug level monitoring: Secondary | ICD-10-CM | POA: Diagnosis not present

## 2021-06-10 LAB — POCT INR: INR: 2.6 (ref 2.0–3.0)

## 2021-06-10 NOTE — Patient Instructions (Signed)
Description   ?Take 2 tablets today and then continue 1.5 tablets daily except 1 tablet each Monday, Wednesday and Friday. Repeat INR in 6 weeks. [phone] ?  ?   ?

## 2021-07-10 ENCOUNTER — Ambulatory Visit (INDEPENDENT_AMBULATORY_CARE_PROVIDER_SITE_OTHER): Payer: PPO

## 2021-07-10 DIAGNOSIS — I442 Atrioventricular block, complete: Secondary | ICD-10-CM

## 2021-07-11 LAB — CUP PACEART REMOTE DEVICE CHECK
Battery Impedance: 425 Ohm
Battery Remaining Longevity: 99 mo
Battery Voltage: 2.78 V
Brady Statistic AP VP Percent: 0 %
Brady Statistic AP VS Percent: 50 %
Brady Statistic AS VP Percent: 0 %
Brady Statistic AS VS Percent: 49 %
Date Time Interrogation Session: 20230421144705
Implantable Lead Implant Date: 20160301
Implantable Lead Implant Date: 20160301
Implantable Lead Location: 753859
Implantable Lead Location: 753860
Implantable Lead Model: 5076
Implantable Lead Model: 5076
Implantable Pulse Generator Implant Date: 20160301
Lead Channel Impedance Value: 448 Ohm
Lead Channel Impedance Value: 691 Ohm
Lead Channel Pacing Threshold Amplitude: 0.625 V
Lead Channel Pacing Threshold Amplitude: 0.625 V
Lead Channel Pacing Threshold Pulse Width: 0.4 ms
Lead Channel Pacing Threshold Pulse Width: 0.4 ms
Lead Channel Setting Pacing Amplitude: 2 V
Lead Channel Setting Pacing Amplitude: 2.5 V
Lead Channel Setting Pacing Pulse Width: 0.4 ms
Lead Channel Setting Sensing Sensitivity: 5.6 mV

## 2021-07-22 ENCOUNTER — Ambulatory Visit (INDEPENDENT_AMBULATORY_CARE_PROVIDER_SITE_OTHER): Payer: PPO

## 2021-07-22 DIAGNOSIS — I480 Paroxysmal atrial fibrillation: Secondary | ICD-10-CM

## 2021-07-22 DIAGNOSIS — Z5181 Encounter for therapeutic drug level monitoring: Secondary | ICD-10-CM

## 2021-07-22 LAB — POCT INR: INR: 3.4 — AB (ref 2.0–3.0)

## 2021-07-22 NOTE — Patient Instructions (Signed)
Description   ?Continue 1.5 tablets daily except 1 tablet each Monday, Wednesday and Friday. Repeat INR in 6 weeks. [phone] ?  ?   ?

## 2021-07-25 NOTE — Progress Notes (Signed)
Remote pacemaker transmission.   

## 2021-09-09 ENCOUNTER — Ambulatory Visit (INDEPENDENT_AMBULATORY_CARE_PROVIDER_SITE_OTHER): Payer: PPO

## 2021-09-09 DIAGNOSIS — Z5181 Encounter for therapeutic drug level monitoring: Secondary | ICD-10-CM

## 2021-09-09 DIAGNOSIS — I480 Paroxysmal atrial fibrillation: Secondary | ICD-10-CM | POA: Diagnosis not present

## 2021-09-09 LAB — POCT INR: INR: 2.4 (ref 2.0–3.0)

## 2021-09-09 NOTE — Patient Instructions (Signed)
Description   Take 2 tablets today and then continue 1.5 tablets daily except 1 tablet each Monday, Wednesday and Friday.  Repeat INR in 6 weeks. [facility]in [facility]  #[phone]      

## 2021-10-10 ENCOUNTER — Ambulatory Visit (INDEPENDENT_AMBULATORY_CARE_PROVIDER_SITE_OTHER): Payer: PPO

## 2021-10-10 DIAGNOSIS — I442 Atrioventricular block, complete: Secondary | ICD-10-CM

## 2021-10-10 LAB — CUP PACEART REMOTE DEVICE CHECK
Battery Impedance: 499 Ohm
Battery Remaining Longevity: 93 mo
Battery Voltage: 2.78 V
Brady Statistic AP VP Percent: 0 %
Brady Statistic AP VS Percent: 49 %
Brady Statistic AS VP Percent: 0 %
Brady Statistic AS VS Percent: 50 %
Date Time Interrogation Session: 20230721152913
Implantable Lead Implant Date: 20160301
Implantable Lead Implant Date: 20160301
Implantable Lead Location: 753859
Implantable Lead Location: 753860
Implantable Lead Model: 5076
Implantable Lead Model: 5076
Implantable Pulse Generator Implant Date: 20160301
Lead Channel Impedance Value: 409 Ohm
Lead Channel Impedance Value: 655 Ohm
Lead Channel Pacing Threshold Amplitude: 0.625 V
Lead Channel Pacing Threshold Amplitude: 0.75 V
Lead Channel Pacing Threshold Pulse Width: 0.4 ms
Lead Channel Pacing Threshold Pulse Width: 0.4 ms
Lead Channel Setting Pacing Amplitude: 2 V
Lead Channel Setting Pacing Amplitude: 2.5 V
Lead Channel Setting Pacing Pulse Width: 0.4 ms
Lead Channel Setting Sensing Sensitivity: 4 mV

## 2021-10-21 ENCOUNTER — Ambulatory Visit (INDEPENDENT_AMBULATORY_CARE_PROVIDER_SITE_OTHER): Payer: PPO

## 2021-10-21 DIAGNOSIS — Z5181 Encounter for therapeutic drug level monitoring: Secondary | ICD-10-CM | POA: Diagnosis not present

## 2021-10-21 DIAGNOSIS — I480 Paroxysmal atrial fibrillation: Secondary | ICD-10-CM | POA: Diagnosis not present

## 2021-10-21 LAB — POCT INR: INR: 2.6 (ref 2.0–3.0)

## 2021-10-21 NOTE — Patient Instructions (Signed)
Description   Continue 1.5 tablets daily except 1 tablet each Monday, Wednesday and Friday.  Repeat INR in 6 weeks. [facility]madin Clinic  #[phone]      

## 2021-10-27 NOTE — Progress Notes (Signed)
Remote pacemaker transmission.   

## 2021-12-09 ENCOUNTER — Ambulatory Visit: Payer: PPO | Attending: Cardiology

## 2021-12-09 DIAGNOSIS — Z5181 Encounter for therapeutic drug level monitoring: Secondary | ICD-10-CM | POA: Diagnosis not present

## 2021-12-09 DIAGNOSIS — I480 Paroxysmal atrial fibrillation: Secondary | ICD-10-CM

## 2021-12-09 LAB — POCT INR: INR: 2.5 (ref 2.0–3.0)

## 2021-12-09 NOTE — Patient Instructions (Signed)
Description   Take 2 tablets today and then continue 1.5 tablets daily except 1 tablet each Monday, Wednesday and Friday.  Repeat INR in 6 weeks. Coumadin Clinic  [phone]

## 2022-01-09 ENCOUNTER — Ambulatory Visit (INDEPENDENT_AMBULATORY_CARE_PROVIDER_SITE_OTHER): Payer: PPO

## 2022-01-09 DIAGNOSIS — I442 Atrioventricular block, complete: Secondary | ICD-10-CM

## 2022-01-12 LAB — CUP PACEART REMOTE DEVICE CHECK
Battery Impedance: 550 Ohm
Battery Remaining Longevity: 89 mo
Battery Voltage: 2.78 V
Brady Statistic AP VP Percent: 0 %
Brady Statistic AP VS Percent: 49 %
Brady Statistic AS VP Percent: 0 %
Brady Statistic AS VS Percent: 51 %
Date Time Interrogation Session: 20231020155406
Implantable Lead Implant Date: 20160301
Implantable Lead Implant Date: 20160301
Implantable Lead Location: 753859
Implantable Lead Location: 753860
Implantable Lead Model: 5076
Implantable Lead Model: 5076
Implantable Pulse Generator Implant Date: 20160301
Lead Channel Impedance Value: 442 Ohm
Lead Channel Impedance Value: 684 Ohm
Lead Channel Pacing Threshold Amplitude: 0.625 V
Lead Channel Pacing Threshold Amplitude: 0.625 V
Lead Channel Pacing Threshold Pulse Width: 0.4 ms
Lead Channel Pacing Threshold Pulse Width: 0.4 ms
Lead Channel Setting Pacing Amplitude: 2 V
Lead Channel Setting Pacing Amplitude: 2.5 V
Lead Channel Setting Pacing Pulse Width: 0.4 ms
Lead Channel Setting Sensing Sensitivity: 5.6 mV

## 2022-01-15 NOTE — Progress Notes (Signed)
Remote pacemaker transmission.   

## 2022-01-20 ENCOUNTER — Ambulatory Visit: Payer: PPO | Attending: Cardiology

## 2022-01-20 DIAGNOSIS — Z5181 Encounter for therapeutic drug level monitoring: Secondary | ICD-10-CM

## 2022-01-20 DIAGNOSIS — I480 Paroxysmal atrial fibrillation: Secondary | ICD-10-CM

## 2022-01-20 LAB — POCT INR: INR: 3 (ref 2.0–3.0)

## 2022-01-20 NOTE — Patient Instructions (Signed)
Description   Continue 1.5 tablets daily except 1 tablet each Monday, Wednesday and Friday.  Repeat INR in 6 weeks. Coumadin Clinic  [phone]

## 2022-03-03 ENCOUNTER — Ambulatory Visit: Payer: PPO | Attending: Cardiology

## 2022-03-03 DIAGNOSIS — I480 Paroxysmal atrial fibrillation: Secondary | ICD-10-CM

## 2022-03-03 DIAGNOSIS — Z5181 Encounter for therapeutic drug level monitoring: Secondary | ICD-10-CM

## 2022-03-03 LAB — POCT INR: INR: 2.2 (ref 2.0–3.0)

## 2022-03-03 NOTE — Patient Instructions (Signed)
Description   Take 2 tablets today and then continue 1.5 tablets daily except 1 tablet each Monday, Wednesday and Friday.  Repeat INR in 4 weeks. Coumadin Clinic  [phone]

## 2022-03-31 ENCOUNTER — Ambulatory Visit: Payer: PPO

## 2022-04-07 ENCOUNTER — Ambulatory Visit: Payer: PPO | Attending: Cardiology

## 2022-04-07 DIAGNOSIS — I480 Paroxysmal atrial fibrillation: Secondary | ICD-10-CM

## 2022-04-07 DIAGNOSIS — Z5181 Encounter for therapeutic drug level monitoring: Secondary | ICD-10-CM | POA: Diagnosis not present

## 2022-04-07 LAB — POCT INR: INR: 2.6 (ref 2.0–3.0)

## 2022-04-07 NOTE — Patient Instructions (Signed)
Description   Take 2 tablets today and then continue 1.5 tablets daily except 1 tablet each Monday, Wednesday and Friday.  Repeat INR in 5 weeks. Coumadin Clinic  [phone]

## 2022-04-10 ENCOUNTER — Ambulatory Visit: Payer: PPO | Attending: Cardiology

## 2022-04-10 DIAGNOSIS — I442 Atrioventricular block, complete: Secondary | ICD-10-CM | POA: Diagnosis not present

## 2022-04-10 LAB — CUP PACEART REMOTE DEVICE CHECK
Battery Impedance: 625 Ohm
Battery Remaining Longevity: 84 mo
Battery Voltage: 2.78 V
Brady Statistic AP VP Percent: 0 %
Brady Statistic AP VS Percent: 49 %
Brady Statistic AS VP Percent: 0 %
Brady Statistic AS VS Percent: 51 %
Date Time Interrogation Session: 20240119124406
Implantable Lead Connection Status: 753985
Implantable Lead Connection Status: 753985
Implantable Lead Implant Date: 20160301
Implantable Lead Implant Date: 20160301
Implantable Lead Location: 753859
Implantable Lead Location: 753860
Implantable Lead Model: 5076
Implantable Lead Model: 5076
Implantable Pulse Generator Implant Date: 20160301
Lead Channel Impedance Value: 467 Ohm
Lead Channel Impedance Value: 697 Ohm
Lead Channel Pacing Threshold Amplitude: 0.625 V
Lead Channel Pacing Threshold Amplitude: 0.625 V
Lead Channel Pacing Threshold Pulse Width: 0.4 ms
Lead Channel Pacing Threshold Pulse Width: 0.4 ms
Lead Channel Setting Pacing Amplitude: 2 V
Lead Channel Setting Pacing Amplitude: 2.5 V
Lead Channel Setting Pacing Pulse Width: 0.4 ms
Lead Channel Setting Sensing Sensitivity: 5.6 mV
Zone Setting Status: 755011
Zone Setting Status: 755011

## 2022-04-27 NOTE — Progress Notes (Signed)
Remote pacemaker transmission.   

## 2022-04-30 ENCOUNTER — Encounter (HOSPITAL_COMMUNITY): Payer: Self-pay | Admitting: *Deleted

## 2022-05-12 ENCOUNTER — Ambulatory Visit: Payer: PPO | Attending: Cardiology

## 2022-05-12 DIAGNOSIS — I480 Paroxysmal atrial fibrillation: Secondary | ICD-10-CM | POA: Diagnosis not present

## 2022-05-12 LAB — POCT INR: INR: 2.2 (ref 2.0–3.0)

## 2022-05-12 NOTE — Patient Instructions (Signed)
Description   Take 2 tablets today and then START 1.5 tablets daily except 1 tablet each Monday and Friday.  Repeat INR in 2 weeks. Coumadin Clinic  [phone]

## 2022-05-26 ENCOUNTER — Ambulatory Visit: Payer: PPO | Attending: Cardiology

## 2022-05-26 DIAGNOSIS — I480 Paroxysmal atrial fibrillation: Secondary | ICD-10-CM

## 2022-05-26 DIAGNOSIS — Z5181 Encounter for therapeutic drug level monitoring: Secondary | ICD-10-CM

## 2022-05-26 LAB — POCT INR: INR: 2.6 (ref 2.0–3.0)

## 2022-05-26 NOTE — Patient Instructions (Signed)
Description   Take 2 tablets today and then continue 1.5 tablets daily except 1 tablet each Monday and Friday.  Repeat INR in 4 weeks. Coumadin Clinic  [phone]

## 2022-06-11 ENCOUNTER — Emergency Department (HOSPITAL_COMMUNITY): Payer: PPO

## 2022-06-11 ENCOUNTER — Inpatient Hospital Stay (HOSPITAL_COMMUNITY)
Admission: EM | Admit: 2022-06-11 | Discharge: 2022-06-22 | DRG: 082 | Disposition: E | Payer: PPO | Attending: Pulmonary Disease | Admitting: Pulmonary Disease

## 2022-06-11 DIAGNOSIS — E039 Hypothyroidism, unspecified: Secondary | ICD-10-CM | POA: Diagnosis present

## 2022-06-11 DIAGNOSIS — Y92009 Unspecified place in unspecified non-institutional (private) residence as the place of occurrence of the external cause: Secondary | ICD-10-CM

## 2022-06-11 DIAGNOSIS — G919 Hydrocephalus, unspecified: Secondary | ICD-10-CM | POA: Diagnosis present

## 2022-06-11 DIAGNOSIS — I129 Hypertensive chronic kidney disease with stage 1 through stage 4 chronic kidney disease, or unspecified chronic kidney disease: Secondary | ICD-10-CM | POA: Diagnosis present

## 2022-06-11 DIAGNOSIS — R402432 Glasgow coma scale score 3-8, at arrival to emergency department: Secondary | ICD-10-CM | POA: Diagnosis present

## 2022-06-11 DIAGNOSIS — Z79899 Other long term (current) drug therapy: Secondary | ICD-10-CM

## 2022-06-11 DIAGNOSIS — S065XAA Traumatic subdural hemorrhage with loss of consciousness status unknown, initial encounter: Secondary | ICD-10-CM | POA: Diagnosis present

## 2022-06-11 DIAGNOSIS — R791 Abnormal coagulation profile: Secondary | ICD-10-CM | POA: Diagnosis present

## 2022-06-11 DIAGNOSIS — Z952 Presence of prosthetic heart valve: Secondary | ICD-10-CM

## 2022-06-11 DIAGNOSIS — G4733 Obstructive sleep apnea (adult) (pediatric): Secondary | ICD-10-CM | POA: Diagnosis present

## 2022-06-11 DIAGNOSIS — E1122 Type 2 diabetes mellitus with diabetic chronic kidney disease: Secondary | ICD-10-CM | POA: Diagnosis present

## 2022-06-11 DIAGNOSIS — T45515A Adverse effect of anticoagulants, initial encounter: Secondary | ICD-10-CM | POA: Diagnosis present

## 2022-06-11 DIAGNOSIS — Z833 Family history of diabetes mellitus: Secondary | ICD-10-CM

## 2022-06-11 DIAGNOSIS — W19XXXA Unspecified fall, initial encounter: Secondary | ICD-10-CM | POA: Diagnosis present

## 2022-06-11 DIAGNOSIS — H9192 Unspecified hearing loss, left ear: Secondary | ICD-10-CM | POA: Diagnosis present

## 2022-06-11 DIAGNOSIS — E871 Hypo-osmolality and hyponatremia: Secondary | ICD-10-CM | POA: Diagnosis present

## 2022-06-11 DIAGNOSIS — Z7189 Other specified counseling: Secondary | ICD-10-CM | POA: Diagnosis not present

## 2022-06-11 DIAGNOSIS — F32A Depression, unspecified: Secondary | ICD-10-CM | POA: Diagnosis present

## 2022-06-11 DIAGNOSIS — Z85528 Personal history of other malignant neoplasm of kidney: Secondary | ICD-10-CM

## 2022-06-11 DIAGNOSIS — I4819 Other persistent atrial fibrillation: Secondary | ICD-10-CM | POA: Diagnosis present

## 2022-06-11 DIAGNOSIS — Z66 Do not resuscitate: Secondary | ICD-10-CM | POA: Diagnosis present

## 2022-06-11 DIAGNOSIS — Z8673 Personal history of transient ischemic attack (TIA), and cerebral infarction without residual deficits: Secondary | ICD-10-CM

## 2022-06-11 DIAGNOSIS — G935 Compression of brain: Secondary | ICD-10-CM | POA: Diagnosis present

## 2022-06-11 DIAGNOSIS — S066XAA Traumatic subarachnoid hemorrhage with loss of consciousness status unknown, initial encounter: Secondary | ICD-10-CM | POA: Diagnosis present

## 2022-06-11 DIAGNOSIS — I480 Paroxysmal atrial fibrillation: Secondary | ICD-10-CM

## 2022-06-11 DIAGNOSIS — Z905 Acquired absence of kidney: Secondary | ICD-10-CM

## 2022-06-11 DIAGNOSIS — Z823 Family history of stroke: Secondary | ICD-10-CM

## 2022-06-11 DIAGNOSIS — E785 Hyperlipidemia, unspecified: Secondary | ICD-10-CM | POA: Diagnosis present

## 2022-06-11 DIAGNOSIS — Z79890 Hormone replacement therapy (postmenopausal): Secondary | ICD-10-CM | POA: Diagnosis not present

## 2022-06-11 DIAGNOSIS — Z7982 Long term (current) use of aspirin: Secondary | ICD-10-CM

## 2022-06-11 DIAGNOSIS — Z7984 Long term (current) use of oral hypoglycemic drugs: Secondary | ICD-10-CM

## 2022-06-11 DIAGNOSIS — N182 Chronic kidney disease, stage 2 (mild): Secondary | ICD-10-CM | POA: Diagnosis present

## 2022-06-11 DIAGNOSIS — J9601 Acute respiratory failure with hypoxia: Secondary | ICD-10-CM

## 2022-06-11 DIAGNOSIS — I169 Hypertensive crisis, unspecified: Secondary | ICD-10-CM

## 2022-06-11 DIAGNOSIS — Z515 Encounter for palliative care: Secondary | ICD-10-CM | POA: Diagnosis not present

## 2022-06-11 DIAGNOSIS — F17210 Nicotine dependence, cigarettes, uncomplicated: Secondary | ICD-10-CM | POA: Diagnosis present

## 2022-06-11 DIAGNOSIS — Z888 Allergy status to other drugs, medicaments and biological substances status: Secondary | ICD-10-CM

## 2022-06-11 DIAGNOSIS — I2510 Atherosclerotic heart disease of native coronary artery without angina pectoris: Secondary | ICD-10-CM | POA: Diagnosis present

## 2022-06-11 DIAGNOSIS — Z7901 Long term (current) use of anticoagulants: Secondary | ICD-10-CM

## 2022-06-11 DIAGNOSIS — Z950 Presence of cardiac pacemaker: Secondary | ICD-10-CM

## 2022-06-11 DIAGNOSIS — Z91041 Radiographic dye allergy status: Secondary | ICD-10-CM

## 2022-06-11 DIAGNOSIS — Z8249 Family history of ischemic heart disease and other diseases of the circulatory system: Secondary | ICD-10-CM

## 2022-06-11 LAB — DIFFERENTIAL
Abs Immature Granulocytes: 0.16 10*3/uL — ABNORMAL HIGH (ref 0.00–0.07)
Basophils Absolute: 0.1 10*3/uL (ref 0.0–0.1)
Basophils Relative: 1 %
Eosinophils Absolute: 0.1 10*3/uL (ref 0.0–0.5)
Eosinophils Relative: 0 %
Immature Granulocytes: 1 %
Lymphocytes Relative: 8 %
Lymphs Abs: 1.8 10*3/uL (ref 0.7–4.0)
Monocytes Absolute: 1.3 10*3/uL — ABNORMAL HIGH (ref 0.1–1.0)
Monocytes Relative: 5 %
Neutro Abs: 20.1 10*3/uL — ABNORMAL HIGH (ref 1.7–7.7)
Neutrophils Relative %: 85 %

## 2022-06-11 LAB — COMPREHENSIVE METABOLIC PANEL
ALT: 22 U/L (ref 0–44)
AST: 26 U/L (ref 15–41)
Albumin: 4 g/dL (ref 3.5–5.0)
Alkaline Phosphatase: 108 U/L (ref 38–126)
Anion gap: 9 (ref 5–15)
BUN: 26 mg/dL — ABNORMAL HIGH (ref 8–23)
CO2: 18 mmol/L — ABNORMAL LOW (ref 22–32)
Calcium: 10.1 mg/dL (ref 8.9–10.3)
Chloride: 106 mmol/L (ref 98–111)
Creatinine, Ser: 1.42 mg/dL — ABNORMAL HIGH (ref 0.61–1.24)
GFR, Estimated: 53 mL/min — ABNORMAL LOW (ref >60)
Glucose, Bld: 323 mg/dL — ABNORMAL HIGH (ref 70–99)
Potassium: 5 mmol/L (ref 3.5–5.1)
Sodium: 133 mmol/L — ABNORMAL LOW (ref 135–145)
Total Bilirubin: 0.6 mg/dL (ref 0.3–1.2)
Total Protein: 6.6 g/dL (ref 6.5–8.1)

## 2022-06-11 LAB — I-STAT ARTERIAL BLOOD GAS, ED
Acid-base deficit: 1 mmol/L (ref 0.0–2.0)
Bicarbonate: 24.8 mmol/L (ref 20.0–28.0)
Calcium, Ion: 1.3 mmol/L (ref 1.15–1.40)
HCT: 44 % (ref 39.0–52.0)
Hemoglobin: 15 g/dL (ref 13.0–17.0)
O2 Saturation: 100 %
Potassium: 4.3 mmol/L (ref 3.5–5.1)
Sodium: 135 mmol/L (ref 135–145)
TCO2: 26 mmol/L (ref 22–32)
pCO2 arterial: 44.2 mmHg (ref 32–48)
pH, Arterial: 7.357 (ref 7.35–7.45)
pO2, Arterial: 402 mmHg — ABNORMAL HIGH (ref 83–108)

## 2022-06-11 LAB — I-STAT CHEM 8, ED
BUN: 32 mg/dL — ABNORMAL HIGH (ref 8–23)
Calcium, Ion: 1.24 mmol/L (ref 1.15–1.40)
Chloride: 106 mmol/L (ref 98–111)
Creatinine, Ser: 1.4 mg/dL — ABNORMAL HIGH (ref 0.61–1.24)
Glucose, Bld: 336 mg/dL — ABNORMAL HIGH (ref 70–99)
HCT: 49 % (ref 39.0–52.0)
Hemoglobin: 16.7 g/dL (ref 13.0–17.0)
Potassium: 5.1 mmol/L (ref 3.5–5.1)
Sodium: 137 mmol/L (ref 135–145)
TCO2: 24 mmol/L (ref 22–32)

## 2022-06-11 LAB — CBC
HCT: 46.6 % (ref 39.0–52.0)
Hemoglobin: 16.1 g/dL (ref 13.0–17.0)
MCH: 32 pg (ref 26.0–34.0)
MCHC: 34.5 g/dL (ref 30.0–36.0)
MCV: 92.6 fL (ref 80.0–100.0)
Platelets: 243 10*3/uL (ref 150–400)
RBC: 5.03 MIL/uL (ref 4.22–5.81)
RDW: 14.2 % (ref 11.5–15.5)
WBC: 23.5 10*3/uL — ABNORMAL HIGH (ref 4.0–10.5)
nRBC: 0 % (ref 0.0–0.2)

## 2022-06-11 LAB — ETHANOL: Alcohol, Ethyl (B): <10 mg/dL (ref <10)

## 2022-06-11 LAB — PROTIME-INR
INR: 2.1 — ABNORMAL HIGH (ref 0.8–1.2)
INR: 1.2 (ref 0.8–1.2)
Prothrombin Time: 23.3 seconds — ABNORMAL HIGH (ref 11.4–15.2)
Prothrombin Time: 14.7 seconds (ref 11.4–15.2)

## 2022-06-11 LAB — APTT: aPTT: 34 seconds (ref 24–36)

## 2022-06-11 MED ORDER — ROCURONIUM BROMIDE 50 MG/5ML IV SOLN
INTRAVENOUS | Status: DC | PRN
  Administered 2022-06-11: 100 mg via INTRAVENOUS

## 2022-06-11 MED ORDER — POLYETHYLENE GLYCOL 3350 17 G PO PACK: 17.0000 g | PACK | Freq: Every day | ORAL | Status: DC

## 2022-06-11 MED ORDER — POLYETHYLENE GLYCOL 3350 17 G PO PACK: 17.0000 g | PACK | Freq: Every day | ORAL | Status: DC | PRN

## 2022-06-11 MED ORDER — FENTANYL BOLUS VIA INFUSION
25.0000 ug | INTRAVENOUS | Status: DC | PRN
  Administered 2022-06-12: 50 ug via INTRAVENOUS
  Administered 2022-06-12 (×6): 100 ug via INTRAVENOUS

## 2022-06-11 MED ORDER — VITAMIN K1 10 MG/ML IJ SOLN
10.0000 mg | INTRAVENOUS | Status: AC
  Administered 2022-06-11: 10 mg via INTRAVENOUS
  Filled 2022-06-11: qty 1

## 2022-06-11 MED ORDER — LEVETIRACETAM IN NACL 500 MG/100ML IV SOLN
500.0000 mg | Freq: Two times a day (BID) | INTRAVENOUS | Status: DC
  Administered 2022-06-12: 500 mg via INTRAVENOUS
  Filled 2022-06-11: qty 100

## 2022-06-11 MED ORDER — SODIUM CHLORIDE 0.9% FLUSH
3.0000 mL | Freq: Once | INTRAVENOUS | Status: AC
  Administered 2022-06-11: 3 mL via INTRAVENOUS

## 2022-06-11 MED ORDER — PROTHROMBIN COMPLEX CONC HUMAN 500 UNITS IV KIT
2106.0000 [IU] | PACK | Status: AC
  Administered 2022-06-11: 2106 [IU] via INTRAVENOUS
  Filled 2022-06-11: qty 2106

## 2022-06-11 MED ORDER — DOCUSATE SODIUM 100 MG PO CAPS: 100.0000 mg | ORAL_CAPSULE | Freq: Two times a day (BID) | ORAL | Status: DC | PRN

## 2022-06-11 MED ORDER — LABETALOL HCL 5 MG/ML IV SOLN
20.0000 mg | Freq: Once | INTRAVENOUS | Status: AC
  Administered 2022-06-11: 20 mg via INTRAVENOUS

## 2022-06-11 MED ORDER — FENTANYL 2500MCG IN NS 250ML (10MCG/ML) PREMIX INFUSION
25.0000 ug/h | INTRAVENOUS | Status: DC
  Administered 2022-06-11: 25 ug/h via INTRAVENOUS
  Administered 2022-06-12: 150 ug/h via INTRAVENOUS
  Filled 2022-06-11: qty 250

## 2022-06-11 MED ORDER — PROTHROMBIN COMPLEX CONC HUMAN 500 UNITS IV KIT
2000.0000 [IU] | PACK | Status: DC
  Filled 2022-06-11: qty 2000

## 2022-06-11 MED ORDER — ETOMIDATE 2 MG/ML IV SOLN
INTRAVENOUS | Status: DC | PRN
  Administered 2022-06-11: 20 mg via INTRAVENOUS

## 2022-06-11 MED ORDER — DOCUSATE SODIUM 50 MG/5ML PO LIQD: 100.0000 mg | Freq: Two times a day (BID) | ORAL | Status: DC

## 2022-06-11 MED ORDER — SODIUM CHLORIDE 0.9 % IV SOLN
3.0000 g | Freq: Four times a day (QID) | INTRAVENOUS | Status: DC
  Administered 2022-06-11 – 2022-06-12 (×3): 3 g via INTRAVENOUS
  Filled 2022-06-11 (×3): qty 8

## 2022-06-11 MED ORDER — CLEVIDIPINE BUTYRATE 0.5 MG/ML IV EMUL
0.0000 mg/h | INTRAVENOUS | Status: DC
  Administered 2022-06-11: 2 mg/h via INTRAVENOUS
  Filled 2022-06-11 (×2): qty 100

## 2022-06-11 MED ORDER — PROPOFOL 1000 MG/100ML IV EMUL
0.0000 ug/kg/min | INTRAVENOUS | Status: DC
  Administered 2022-06-11: 5 ug/kg/min via INTRAVENOUS
  Administered 2022-06-12: 50 ug/kg/min via INTRAVENOUS
  Administered 2022-06-12: 10 ug/kg/min via INTRAVENOUS
  Administered 2022-06-12 (×2): 20 ug/kg/min via INTRAVENOUS
  Filled 2022-06-11 (×3): qty 100

## 2022-06-11 MED ORDER — FENTANYL CITRATE PF 50 MCG/ML IJ SOSY
25.0000 ug | PREFILLED_SYRINGE | Freq: Once | INTRAMUSCULAR | Status: AC
  Administered 2022-06-12: 25 ug via INTRAVENOUS
  Filled 2022-06-11: qty 1

## 2022-06-11 MED ORDER — PROPOFOL 1000 MG/100ML IV EMUL
0.0000 ug/kg/min | INTRAVENOUS | Status: DC
  Administered 2022-06-11: 35 ug/kg/min via INTRAVENOUS
  Filled 2022-06-11: qty 100

## 2022-06-11 MED ORDER — LEVETIRACETAM IN NACL 1000 MG/100ML IV SOLN
1000.0000 mg | Freq: Once | INTRAVENOUS | Status: AC
  Administered 2022-06-11: 1000 mg via INTRAVENOUS
  Filled 2022-06-11: qty 100

## 2022-06-11 NOTE — Progress Notes (Signed)
Pt intubated by ED MD, no complications.

## 2022-06-11 NOTE — Code Documentation (Signed)
Kevin English is a 67 yr old with mechanical heart valve on coumadin, CKD, DM HTN, Renal cell cancer and AF. Pt is from home where he was LKW about 1600 after which time the wife heard him fall. Pt has been minimally responsive since the fall.    Pt met by stroke team at the bridge. CBG, labs drawn. EDP to CT for tenuous airway. Pt to CT with team. NIHSS 39. See flowsheet for details.Pt stuporous on exam. The following imaging was obtained: CT head. Per Dr. Rory Percy, [facility] shows rt SDH.     Pt back to ED room 28 for emergent intubation by EDP. K centra hung. Neurosurgery consulted by EDP. Bedside handoff with ED RN. No thrombolytic, no thrombectomy as SDH.

## 2022-06-11 NOTE — Consult Note (Signed)
   Providing Compassionate, Quality Care - Together  Neurosurgery Consult  Referring physician: Dr. Maylon Peppers Reason for referral: SDH  Chief Complaint: Fall, AMS  History of Present Illness: This is a 67 year old male, brought via EMS due to altered mental status, with a history of A-fib on Coumadin, and found down by his wife.  He apparently had a fall a few hours ago and was unsure if he hit his head and became progressively less responsive.  She had checked his blood sugar and had found that his blood pressure was also elevated called EMS.  Upon arrival he was intubated emergently and was found to have a right dilated pupil, unreactive and was extensor posturing.  Kcentra and vitamin K given for Coumadin reversal.  Wife and daughter at bedside at time of evaluation.  Medications: I have reviewed the patient's current medications. Allergies: No Known Allergies  History reviewed. No pertinent family history. Social History:  has no history on file for tobacco use, alcohol use, and drug use.  ROS: Unable to obtain  Physical Exam:  Vital signs in last 24 hours: Temp:  [98 F (36.7 C)-98.3 F (36.8 C)] 98 F (36.7 C) (07/25 1814) Pulse Rate:  [58-128] 65 (07/26 0746) Resp:  [11-18] 14 (07/26 0217) BP: (138-182)/(65-125) 153/88 (07/26 0700) SpO2:  [91 %-98 %] 96 % (07/26 0746) PE: Intubated Eyes closed, midline gaze Bilateral pupils fixed and dilated at 7 mm No motor response to pain bilaterally Absent corneal reflex bilaterally   Impression/Assessment:  67 year old male with  Acute/hyperacute right subdural hematoma with midline shift, mass effect and herniation  Plan:  -CT brain reviewed.  Given his poor neurologic status, I do not recommend any neurosurgical intervention given the amount of mass effect, evidence of loss of brainstem reflexes.  I explained to the family that this is an unsurvivable event and recommended palliative care consultation. -I offered my  condolences to the family, answered their questions.  Thank you for allowing me to participate in this patient's care.  Please do not hesitate to call with questions or concerns.   Elwin Sleight, [facility]

## 2022-06-11 NOTE — ED Triage Notes (Signed)
Pt BIB [facility] from home. Wife states she heard a large thump and pt had fell. However he was talking to her afterwards. He went to sleep after and then wasn't responding.

## 2022-06-11 NOTE — ED Provider Notes (Signed)
Please see resident records for full note.  In short this is a 67 year old male with a past medical history of A-fib on Coumadin that presented to the emergency department with altered mental status after a fall.  The patient is wife heard a thump and found him on the ground in the room.  On medics arrival, he was somnolent with sonorous respirations and was posturing in the upper extremities.  He did fixed and dilated right pupil and left pupil was approximately 3 mm and minimally reactive.  He was withdrawing to his lower extremities bilaterally stronger on the right compared to the left on his arrival.  Patient was made a prehospital arrival stroke alert and was immediately transferred to CT on his arrival.  He was extremely hypertensive in the [id number] systolic on arrival.  His head CT showed a large subdural hematoma with significant midline shift and concern for possible uncal herniation.  Neurology evaluated the patient on arrival and recommended neurosurgery consultation for further management.  Neurosurgery was consulted and the patient's Coumadin was reversed with Kcentra and vitamin K.  The patient was intubated for airway protection by the resident and supervised by myself.  The patient was started on nicardipine drip for blood pressure control.  Neurosurgery evaluated the patient at bedside and determined that the size of the bleed was too large that he would not benefit from surgical intervention.   Clinical Course as of 06/03/2022 2337  Thu Jun 11, 2022  1914 I spoke with Dr. Reatha Armour of neurosurgery who recommended SBP goal <160, Keppra 1g, and palliative consult. He will evaluate the patient and speak with family to determine if he is a surgical candidate. [VK]  2128 I spoke with critical care for admission. Per family discussion they would consider removing life support but want all family members to have a chance to visit prior to this. [VK]    Clinical Course User Index [VK] Kemper Durie,  DO   CRITICAL CARE Performed by: Kemper Durie   Total critical care time: 45 minutes  Critical care time was exclusive of separately billable procedures and treating other patients.  Critical care was necessary to treat or prevent imminent or life-threatening deterioration.  Critical care was time spent personally by me on the following activities: development of treatment plan with patient and/or surrogate as well as nursing, discussions with consultants, evaluation of patient's response to treatment, examination of patient, obtaining history from patient or surrogate, ordering and performing treatments and interventions, ordering and review of laboratory studies, ordering and review of radiographic studies, pulse oximetry and re-evaluation of patient's condition.    Kemper Durie, DO 05/24/2022 2338

## 2022-06-11 NOTE — Progress Notes (Signed)
Pharmacy Antibiotic Note  Kevin English is a 67 y.o. male for which pharmacy has been consulted for ampicillin-sulbactam dosing for aspiration pneumonia.  Patient with a history of AF on warfarin, renal cancer s/p nephrectomy, MVR, DM. Patient presenting with SDH.  Plan: Unasyn 3g q6h Trend WBC, Fever, Renal function F/u cultures, clinical course, WBC  Height: 5\' 8"  (172.7 cm) Weight: 87.6 kg (193 lb 2 oz) IBW/kg (Calculated) : 68.4  No data recorded.  Recent Labs  Lab 06/10/2022 1840  WBC 23.5*  CREATININE 1.42*  1.40*    Estimated Creatinine Clearance: 52.8 mL/min (A) (by C-G formula based on SCr of 1.42 mg/dL (H)).    Allergies  Allergen Reactions   Contrast Media [Iodinated Contrast Media] Hives and Other (See Comments)    Patient started sneezing and coughing, patient broke out in hives, patient needs 13 hour prep if having IV contrast   Ioxaglate Hives and Rash    Patient started sneezing and coughing, patient broke out in hives, patient needs 13 hour prep if having IV contrast   Atorvastatin Other (See Comments)    Myalgias   Crestor [Rosuvastatin] Other (See Comments)    Myalgias   Fenofibrate Other (See Comments)    MYALGIAS    Pravastatin Other (See Comments)    Myalgias    Pseudoephedrine Hcl Palpitations   Microbiology results: Pending  Thank you for allowing pharmacy to be a part of this patient's care.  Lorelei Pont, PharmD, BCPS 06/15/2022 9:54 PM ED Clinical Pharmacist -  [phone]

## 2022-06-11 NOTE — H&P (Addendum)
NAME:  Kevin English, MRN:  [id number], DOB:  Apr 06, 1955, LOS: 0 ADMISSION DATE:  06/12/2022, CONSULTATION DATE: 3/21 REFERRING MD: Dr. Maylon Peppers, CHIEF COMPLAINT: Subdural hemorrhage  History of Present Illness:  67 year old-year-old male with past medical history as below which is significant for atrial fibrillation on warfarin, renal cancer status post nephrectomy, mitral valve replacement, and diabetes mellitus.  He was in his usual state of health until 3/21 around 4 PM when he suffered a fall.  Wife heard a thud and found him down.  Initially he was doing okay and went to lay down.  When she went to check on him a couple hours later he was much less responsive and EMS was called.  Upon arrival to the emergency department he was experiencing extensor posturing and was emergently intubated for airway protection.  CT of the head unfortunately demonstrated a large right-sided subdural hematoma with 2.7 cm of right to left midline shift, subfalcine herniation, and worrisome for uncal herniation.  He was evaluated by neurology and neurosurgery who determined this injury to be an operable and to portend a poor prognosis.  PCCM was asked to admit to ICU.  Pertinent  Medical History   has a past medical history of Anxiety, Borderline diabetes, CAD (coronary artery disease), CKD (chronic kidney disease), stage II, Complete heart block ([place]), Deafness in left ear, Depression, Diabetes mellitus without complication ([place]), Diverticulitis of colon (15 years ago), Hernia, HTN (hypertension), Hyperlipidemia, Hypogonadism male, Hypothyroidism, Lung nodule seen on imaging study (04/10/2014), Mitral stenosis, Obesity, OSA on CPAP, Persistent atrial fibrillation ([place]), Renal cell carcinoma of right kidney ([place]) (09/05/2013), Renal mass, Rheumatic heart disease, S/P Minimally invasive maze operation for atrial fibrillation (05/15/2014), S/P minimally invasive mitral valve replacement with metallic valve and maze procedure  (05/15/2014), and Stroke ([place]) (1998).   Significant Hospital Events: Including procedures, antibiotic start and stop dates in addition to other pertinent events   3/21 admit  Interim History / Subjective:    Objective   Blood pressure (!) 140/75, pulse 60, resp. rate 18, height 5\' 8"  (1.727 m), weight 87.6 kg, SpO2 100 %.    Vent Mode: PRVC FiO2 (%):  [50 %-100 %] 50 % Set Rate:  [18 bmp-22 bmp] 22 bmp Vt Set:  [540 mL] 540 mL PEEP:  [5 cmH20] 5 cmH20 Plateau Pressure:  [17 cmH20-19 cmH20] 17 cmH20   Intake/Output Summary (Last 24 hours) at 05/31/2022 2115 Last data filed at 06/12/2022 2025 Gross per 24 hour  Intake 120.76 ml  Output --  Net 120.76 ml   Filed Weights   06/08/2022 1839  Weight: 87.6 kg    Examination: General: Elderly appearing male in NAD on vent HENT: [place]/AT, Right pupil irregular, left pupil 78mm and unreactive.  Lungs: Clear, bilateral breath sounds Cardiovascular: RRR, no MRG Abdomen: Soft, non-distended Extremities: No acute deformity. No edema Neuro: Unresponsive, decerebrate posturing to noxious stimuli. Occasional L foot twitching.   Resolved Hospital Problem list     Assessment & Plan:   Traumatic subdural hematoma: With concern for herniation.  He was evaluated by neurology and neurosurgery in the emergency department who feel this is inoperable and unfortunately unsurvivable.  Warfarin was reversed in the emergency department. -Admit patient to ICU for supportive care -Neuroprotective measures -Clevidipine infusion to keep systolic blood pressure less than 160 -Schedule Keppra -Neurochecks per unit protocol -No escalation, will provide analgesia for comfort. -Family understands poor prognosis and are currently gathering. Will likely transition to comfort measures only 3/22 evening  Acute respiratory failure with hypoxia -Full ventilatory support -CXR/ABG as needed -VAP bundle -Not a candidate for SBT -Unasyn for  aspiration  Hypertensive crisis -Clevidipine infusion as above -Vital signs per unit protocol  Atrial fibrillation -Holding warfarin  Diabetes mellitus -CBG monitoring and sliding scale insulin  Best Practice (right click and "Reselect all SmartList Selections" daily)   Diet/type: NPO DVT prophylaxis: not indicated GI prophylaxis: N/A Lines: N/A Foley:  N/A Code Status:  DNR Last date of multidisciplinary goals of care discussion [3/21]  Labs   CBC: Recent Labs  Lab 06/20/2022 1840 05/24/2022 2017  WBC 23.5*  --   NEUTROABS 20.1*  --   HGB 16.1  16.7 15.0  HCT 46.6  49.0 44.0  MCV 92.6  --   PLT 243  --     Basic Metabolic Panel: Recent Labs  Lab 05/29/2022 1840 06/12/2022 2017  NA 133*  137 135  K 5.0  5.1 4.3  CL 106  106  --   CO2 18*  --   GLUCOSE 323*  336*  --   BUN 26*  32*  --   CREATININE 1.42*  1.40*  --   CALCIUM 10.1  --    GFR: Estimated Creatinine Clearance: 52.8 mL/min (A) (by C-G formula based on SCr of 1.42 mg/dL (H)). Recent Labs  Lab 06/06/2022 1840  WBC 23.5*    Liver Function Tests: Recent Labs  Lab 05/24/2022 1840  AST 26  ALT 22  ALKPHOS 108  BILITOT 0.6  PROT 6.6  ALBUMIN 4.0   No results for input(s): "LIPASE", "AMYLASE" in the last 168 hours. No results for input(s): "AMMONIA" in the last 168 hours.  ABG    Component Value Date/Time   PHART 7.357 06/04/2022 2017   PCO2ART 44.2 05/23/2022 2017   PO2ART 402 (H) 06/05/2022 2017   HCO3 24.8 05/22/2022 2017   TCO2 26 06/07/2022 2017   ACIDBASEDEF 1.0 05/31/2022 2017   O2SAT 100 06/03/2022 2017     Coagulation Profile: Recent Labs  Lab 06/02/2022 1840  INR 2.1*    Cardiac Enzymes: No results for input(s): "CKTOTAL", "CKMB", "CKMBINDEX", "TROPONINI" in the last 168 hours.  HbA1C: Hgb A1c MFr Bld  Date/Time Value Ref Range Status  11/11/2018 11:37 AM 8.1 (H) 4.6 - 6.5 % Final    Comment:    Glycemic Control Guidelines for People with Diabetes:Non Diabetic:   <6%Goal of Therapy: <7%Additional Action Suggested:  >8%   01/10/2018 11:25 AM 6.0 4.6 - 6.5 % Final    Comment:    Glycemic Control Guidelines for People with Diabetes:Non Diabetic:  <6%Goal of Therapy: <7%Additional Action Suggested:  >8%     CBG: No results for input(s): "GLUCAP" in the last 168 hours.  Review of Systems:   Patient is encephalopathic and/or intubated; therefore, history has been obtained from chart review.   Past Medical History:  He,  has a past medical history of Anxiety, Borderline diabetes, CAD (coronary artery disease), CKD (chronic kidney disease), stage II, Complete heart block ([place]), Deafness in left ear, Depression, Diabetes mellitus without complication ([place]), Diverticulitis of colon (15 years ago), Hernia, HTN (hypertension), Hyperlipidemia, Hypogonadism male, Hypothyroidism, Lung nodule seen on imaging study (04/10/2014), Mitral stenosis, Obesity, OSA on CPAP, Persistent atrial fibrillation ([place]), Renal cell carcinoma of right kidney ([place]) (09/05/2013), Renal mass, Rheumatic heart disease, S/P Minimally invasive maze operation for atrial fibrillation (05/15/2014), S/P minimally invasive mitral valve replacement with metallic valve and maze procedure (05/15/2014), and Stroke ([place]) (1998).  Surgical History:   Past Surgical History:  Procedure Laterality Date   EYE SURGERY Right 1990   "reconstructive, lens implant- from paintball accident"   INGUINAL HERNIA REPAIR Bilateral first one in 57's   "I had one side done twice"   LEFT AND RIGHT HEART CATHETERIZATION WITH CORONARY ANGIOGRAM N/A 08/28/2013   Procedure: LEFT AND RIGHT HEART CATHETERIZATION WITH CORONARY ANGIOGRAM;  Surgeon: Leonie Man, MD;  Location: [facility] CATH LAB;  Service: Cardiovascular;  Laterality: N/A;   MINIMALLY INVASIVE MAZE PROCEDURE N/A 05/15/2014   Procedure: MINIMALLY INVASIVE MAZE PROCEDURE;  Surgeon: Rexene Alberts, MD;  Location: [facility];  Service: Open Heart Surgery;  Laterality: N/A;    MITRAL VALVE REPLACEMENT Right 05/15/2014   Procedure: MINIMALLY INVASIVE MITRAL VALVE (MV) REPLACEMENT;  Surgeon: Rexene Alberts, MD;  Location: [facility];  Service: Open Heart Surgery;  Laterality: Right;   PERMANENT PACEMAKER INSERTION Left 05/22/2014   a. MDT dual chamber PPM implanted by Dr Rayann Heman for CHB s/p MAZE/MVR   ROBOT ASSISTED LAPAROSCOPIC NEPHRECTOMY Right 09/05/2013   Procedure: ROBOTIC ASSISTED LAPAROSCOPIC RIGHT RADICAL NEPHRECTOMY;  Surgeon: Sharyn Creamer, MD;  Location: [facility];  Service: Urology;  Laterality: Right;   TEE WITHOUT CARDIOVERSION N/A 08/16/2013   Procedure: TRANSESOPHAGEAL ECHOCARDIOGRAM (TEE);  Surgeon: Dorothy Spark, MD;  Location: [facility];  Service: Cardiovascular;  Laterality: N/A;   TEE WITHOUT CARDIOVERSION N/A 05/15/2014   Procedure: TRANSESOPHAGEAL ECHOCARDIOGRAM (TEE);  Surgeon: Rexene Alberts, MD;  Location: [facility];  Service: Open Heart Surgery;  Laterality: N/A;     Social History:   reports that he has been smoking cigarettes and cigars. He has a 37.50 pack-year smoking history. He has never used smokeless tobacco. He reports that he does not drink alcohol and does not use drugs.   Family History:  His family history includes Diabetes in his brother; Healthy in his daughter and son; Heart attack in his mother; Heart disease in his brother and father; Hypertension in his mother and another family member; Stroke in his mother.   Allergies Allergies  Allergen Reactions   Contrast Media [Iodinated Contrast Media] Hives and Other (See Comments)    Patient started sneezing and coughing, patient broke out in hives, patient needs 13 hour prep if having IV contrast   Ioxaglate Hives and Rash    Patient started sneezing and coughing, patient broke out in hives, patient needs 13 hour prep if having IV contrast   Atorvastatin Other (See Comments)    Myalgias   Crestor [Rosuvastatin] Other (See Comments)    Myalgias   Fenofibrate Other (See Comments)     MYALGIAS    Pravastatin Other (See Comments)    Myalgias    Pseudoephedrine Hcl Palpitations     Home Medications  Prior to Admission medications   Medication Sig Start Date End Date Taking? Authorizing Provider  acetaminophen (TYLENOL) 325 MG tablet Take 325 mg by mouth every 6 (six) hours as needed (pain).     [provider]  albuterol (VENTOLIN HFA) 108 (90 Base) MCG/ACT inhaler Inhale 2 puffs into the lungs every 4 (four) hours as needed for shortness of breath. 07/24/20   [provider]  amLODipine (NORVASC) 10 MG tablet Take 10 mg by mouth daily. 01/09/21   [provider]  aspirin EC 81 MG tablet Take 1 tablet (81 mg total) by mouth daily. 05/26/14   Barrett, Erin R, PA-C  Blood Glucose Monitoring Suppl (ONE TOUCH ULTRA MINI) w/Device KIT Use  meter to check blood sugars 1-4 times daily as instructed. 10/22/16   [place] Circle, FNP  Continuous Blood Gluc Receiver (FREESTYLE LIBRE 2 READER) DEVI SCAN AS NEEDED FOR CONTINUOUS GLUCOSE MONITORING. 12/20/19   [provider]  Continuous Blood Gluc Sensor (FREESTYLE LIBRE 2 SENSOR) MISC USE AS DIRECTED EVERY 14 DAYS 04/24/20   [provider]  diltiazem (CARDIZEM CD) 120 MG 24 hr capsule Take 1 capsule (120 mg total) by mouth at bedtime. Patient not taking: Reported on 04/04/2021 08/22/20   Freada Bergeron, MD  glucose blood (ONE TOUCH ULTRA TEST) test strip Use one strip per test. Test blood sugars 1-4 times daily as instructed. 09/22/18   Libby Maw, MD  Lancets [facility] DELICA PLUS [id number]) MISC USE 1 LANCET PER TEST. TEST BLOOD SUGARS 1 4 TIMES PER DAY AS INSTRUCTED. 09/22/18   [provider]  levothyroxine (SYNTHROID) 75 MCG tablet TAKE 1 TABLET (75 MCG TOTAL) BY MOUTH DAILY BEFORE BREAKFAST. 03/15/19   Libby Maw, MD  loratadine (CLARITIN) 10 MG tablet Take 1 tablet (10 mg total) by mouth daily. 09/10/15   Dorothy Spark, MD  MELATONIN GUMMIES PO Take 1-2  tablets by mouth at bedtime as needed (sleep). Patient not taking: Reported on 04/04/2021    [provider]  metFORMIN (GLUCOPHAGE) 500 MG tablet Take 500 mg by mouth daily with breakfast. 01/18/20   [provider]  metoprolol tartrate (LOPRESSOR) 25 MG tablet Take 1 tablet by mouth 2 (two) times daily. 02/28/21   [provider]  Omega-3 Fatty Acids (FISH OIL) 1000 MG CAPS Take 1,000 mg by mouth daily.    [provider]  OneTouch Delica Lancets [id number] MISC USE TO TEST BLOOD SUGARS 1-4 TIMES PER DAY AS INSTRUCTED 01/23/19   Libby Maw, MD  sertraline (ZOLOFT) 50 MG tablet Take 50 mg by mouth daily. 05/02/19   [provider]  warfarin (COUMADIN) 5 MG tablet TAKE 1 TO 1.5 TABLETS DAILY AS DIRECTED BY ANTICOAGULATION CLINIC 01/01/20   Dorothy Spark, MD     Critical care time: 36 minutes     Georgann Housekeeper, AGACNP-BC [facility]  See Amion for personal pager PCCM on call pager [phone] until 7pm. Please call Elink 7p-7a. [phone]  05/22/2022 9:30 PM

## 2022-06-11 NOTE — ED Provider Notes (Signed)
[facility] Provider Note   CSN: [id number] Arrival date & time: 06/21/2022  1833     History  No chief complaint on file.   Kevin English is a 67 y.o. male pmh mechanical heart valve on Coumadin, CAD, CKD 2, diabetes, hypertension presents as a code stroke after sudden onset of unresponsiveness.  Patient last known normal was around 1530 per his wife.  He went to take a nap and she heard a thump, when she went to check on him he was on the floor and unresponsive.  HPI     Home Medications Prior to Admission medications   Medication Sig Start Date End Date Taking? Authorizing Provider  acetaminophen (TYLENOL) 325 MG tablet Take 325 mg by mouth every 6 (six) hours as needed (pain).     [provider]  albuterol (VENTOLIN HFA) 108 (90 Base) MCG/ACT inhaler Inhale 2 puffs into the lungs every 4 (four) hours as needed for shortness of breath. 07/24/20   [provider]  amLODipine (NORVASC) 10 MG tablet Take 10 mg by mouth daily. 01/09/21   [provider]  aspirin EC 81 MG tablet Take 1 tablet (81 mg total) by mouth daily. 05/26/14   Barrett, Erin R, PA-C  Blood Glucose Monitoring Suppl (ONE TOUCH ULTRA MINI) w/Device KIT Use meter to check blood sugars 1-4 times daily as instructed. 10/22/16   Gold[place] Circle, FNP  Continuous Blood Gluc Receiver (FREESTYLE LIBRE 2 READER) DEVI SCAN AS NEEDED FOR CONTINUOUS GLUCOSE MONITORING. 12/20/19   [provider]  Continuous Blood Gluc Sensor (FREESTYLE LIBRE 2 SENSOR) MISC USE AS DIRECTED EVERY 14 DAYS 04/24/20   [provider]  diltiazem (CARDIZEM CD) 120 MG 24 hr capsule Take 1 capsule (120 mg total) by mouth at bedtime. Patient not taking: Reported on 04/04/2021 08/22/20   Freada Bergeron, MD  glucose blood (ONE TOUCH ULTRA TEST) test strip Use one strip per test. Test blood sugars 1-4 times daily as instructed. 09/22/18   Libby Maw, MD  Lancets  [facility] DELICA PLUS [id number]) MISC USE 1 LANCET PER TEST. TEST BLOOD SUGARS 1 4 TIMES PER DAY AS INSTRUCTED. 09/22/18   [provider]  levothyroxine (SYNTHROID) 75 MCG tablet TAKE 1 TABLET (75 MCG TOTAL) BY MOUTH DAILY BEFORE BREAKFAST. 03/15/19   Libby Maw, MD  loratadine (CLARITIN) 10 MG tablet Take 1 tablet (10 mg total) by mouth daily. 09/10/15   Dorothy Spark, MD  MELATONIN GUMMIES PO Take 1-2 tablets by mouth at bedtime as needed (sleep). Patient not taking: Reported on 04/04/2021    [provider]  metFORMIN (GLUCOPHAGE) 500 MG tablet Take 500 mg by mouth daily with breakfast. 01/18/20   [provider]  metoprolol tartrate (LOPRESSOR) 25 MG tablet Take 1 tablet by mouth 2 (two) times daily. 02/28/21   [provider]  Omega-3 Fatty Acids (FISH OIL) 1000 MG CAPS Take 1,000 mg by mouth daily.    [provider]  OneTouch Delica Lancets 99991111 MISC USE TO TEST BLOOD SUGARS 1-4 TIMES PER DAY AS INSTRUCTED 01/23/19   Libby Maw, MD  sertraline (ZOLOFT) 50 MG tablet Take 50 mg by mouth daily. 05/02/19   [provider]  warfarin (COUMADIN) 5 MG tablet TAKE 1 TO 1.5 TABLETS DAILY AS DIRECTED BY ANTICOAGULATION CLINIC 01/01/20   Dorothy Spark, MD      Allergies    Contrast media [iodinated contrast media], Ioxaglate, Atorvastatin,  Crestor [rosuvastatin], Fenofibrate, Pravastatin, and Pseudoephedrine hcl    Review of Systems   Review of Systems  Physical Exam Updated Vital Signs BP (!) 182/90   Pulse 76   Resp 18   Ht 5\' 8"  (1.727 m)   Wt 87.6 kg   SpO2 100%   BMI 29.36 kg/m  Physical Exam Constitutional:      Appearance: He is toxic-appearing.     Comments: Unresponsive  HENT:     Head: Normocephalic and atraumatic.  Eyes:     Comments: OD 6 mm nonreactive OS 3 mm, sluggish  Pulmonary:     Comments: On nonrebreather with audible snoring Neurological:     Comments: Decerebrate posturing, pain  response in BLE, no pain response in bilateral upper extremity     ED Results / Procedures / Treatments   Labs (all labs ordered are listed, but only abnormal results are displayed) Labs Reviewed  PROTIME-INR - Abnormal; Notable for the following components:      Result Value   Prothrombin Time 23.3 (*)    INR 2.1 (*)    All other components within normal limits  CBC - Abnormal; Notable for the following components:   WBC 23.5 (*)    All other components within normal limits  DIFFERENTIAL - Abnormal; Notable for the following components:   Neutro Abs 20.1 (*)    Monocytes Absolute 1.3 (*)    Abs Immature Granulocytes 0.16 (*)    All other components within normal limits  I-STAT CHEM 8, ED - Abnormal; Notable for the following components:   BUN 32 (*)    Creatinine, Ser 1.40 (*)    Glucose, Bld 336 (*)    All other components within normal limits  APTT  COMPREHENSIVE METABOLIC PANEL  ETHANOL  TRIGLYCERIDES  BLOOD GAS, ARTERIAL  CBG MONITORING, ED    EKG None  Radiology CT HEAD CODE STROKE WO CONTRAST  Result Date: 06/06/2022 CLINICAL DATA:  Code stroke.  Fall from bed, hit head G EXAM: CT HEAD WITHOUT CONTRAST TECHNIQUE: Contiguous axial images were obtained from the base of the skull through the vertex without intravenous contrast. RADIATION DOSE REDUCTION: This exam was performed according to the departmental dose-optimization program which includes automated exposure control, adjustment of the mA and/or kV according to patient size and/or use of iterative reconstruction technique. COMPARISON:  09/27/2020 FINDINGS: Evaluation is somewhat limited by motion artifact. Brain: Large mixed density subdural hematoma along the right cerebral convexity, measuring up to 3.2 cm, likely acute and hyperacute blood products. Hemorrhage is also noted along the left aspect of the falx, measuring up to 0.9 cm. Significant mass effect on the right cerebral hemisphere with approximately 2.7 cm  of right to left midline shift, resulting in subfalcine herniation. Effacement of the right lateral ventricle and third ventricle, with additional mass effect on and likely entrapment of the left lateral ventricle, with enlargement of the left occipital horn and temporal horn. There is also effacement of the third ventricle. Suspect narrowing of the basilar cisterns, worrisome for uncal herniation. Possible small amount of subarachnoid hemorrhage along the left frontal lobe (series 6, image 21), although this may be artifactual. No definite acute infarct or mass. Vascular: No hyperdense vessel. Skull: No acute fracture or suspicious osseous lesion. Sinuses/Orbits: No acute finding. Other: Nasopharyngeal airway.  The mastoids are well aerated. IMPRESSION: 1. Large likely hyperacute and acute subdural hematoma along the right cerebral convexity and falx, measuring up to 3.2 cm, with significant mass effect on  the right cerebral hemisphere, with approximately 2.7 cm of right to left midline shift, subfalcine herniation, and narrowing of the basilar cisterns, worrisome for uncal herniation. 2. Effacement of the right lateral ventricle and third ventricle, with entrapment of the left lateral ventricle. 3. Possible small amount of subarachnoid hemorrhage along the left frontal lobe, although this may be artifactual. Impression #1 was discussed by telephone on 06/03/2022 at 6:51 pm with provider ARORA. Electronically Signed   By: Merilyn Baba M.D.   On: 05/25/2022 18:57    Procedures Procedure Name: Intubation Date/Time: 05/23/2022 7:13 PM  Performed by: Bradd Canary, MDPre-anesthesia Checklist: Patient identified, Emergency Drugs available, Suction available and Patient being monitored Oxygen Delivery Method: Non-rebreather mask Preoxygenation: Pre-oxygenation with 100% oxygen Induction Type: Rapid sequence Ventilation: Mask ventilation without difficulty Laryngoscope Size: Mac and 4 Grade View: Grade I Tube  size: 8.0 mm Airway Equipment and Method: Stylet Placement Confirmation: ETT inserted through vocal cords under direct vision, CO2 detector and Breath sounds checked- equal and bilateral Secured at: 26 cm Tube secured with: ETT holder Difficulty Due To: Difficulty was unanticipated Comments: Upper dentures removed prior to procedure. Suction required for good view, sats stayed in 90s-100 throughout and pt tolerated well.         Medications Ordered in ED Medications  phytonadione (VITAMIN K) 10 mg in dextrose 5 % 50 mL IVPB (has no administration in time range)  propofol (DIPRIVAN) 1000 MG/100ML infusion (35 mcg/kg/min  87.6 kg Intravenous New Bag/Given 06/10/2022 1908)  labetalol (NORMODYNE) injection 20 mg (20 mg Intravenous Given 06/01/2022 1908)    And  clevidipine (CLEVIPREX) infusion 0.5 mg/mL (has no administration in time range)  prothrombin complex conc human (KCENTRA) IVPB 2,106 Units (has no administration in time range)  sodium chloride flush (NS) 0.9 % injection 3 mL (3 mLs Intravenous Given 05/27/2022 1908)    ED Course/ Medical Decision Making/ A&P Clinical Course as of 06/02/2022 2344  Thu Jun 11, 2022  1914 I spoke with Dr. Reatha Armour of neurosurgery who recommended SBP goal <160, Keppra 1g, and palliative consult. He will evaluate the patient and speak with family to determine if he is a surgical candidate. [VK]  2128 I spoke with critical care for admission. Per family discussion they would consider removing life support but want all family members to have a chance to visit prior to this. [VK]    Clinical Course User Index [VK] Kemper Durie, DO                             Medical Decision Making Amount and/or Complexity of Data Reviewed Labs: ordered. Radiology: ordered.  Risk Prescription drug management. Decision regarding hospitalization.   Patient arrived unstable with audible snoring on nonrebreather, concern for airway.  On exam patient remained  unresponsive, left pupil was fixed and dilated and right pupil was minimally reactive.  Patient had NPA in place, was taken to scanner immediately per neurology and was found to have a large SDH with significant midline shift to the left and hydrocephalus.  Once patient was taken to exam room, he was intubated immediately for airway concern.  For details of his procedure see note above.  Patient was started on propofol for sedation.  Labs are significant for neutropenic leukocytosis, hyperglycemia and appropriate elevated INR.  Patient was started on Cleviprex drip for blood pressure control.  He was also given Kcentra and vitamin K for Coumadin reversal.  Discussed patient  with neurosurgery who discussed with family palliative care as this is not a survivable injury and is nonoperable.  Patient will be admitted to critical care team for further care until family is able to compassionately extubate.        Final Clinical Impression(s) / ED Diagnoses Final diagnoses:  Subdural hematoma ([place])     Bradd Canary, MD 06/14/2022 [place], [place] K, DO 06/19/22 1454

## 2022-06-11 NOTE — Consult Note (Addendum)
Neurology Consultation  Reason for Consult: code stroke - unresponsive, asymmetric pupils Referring Physician: Dr Maylon Peppers   CC: Unresponsive, asymmetric pupils with right pupil fixed and dilated  History is obtained from: Chart review  HPI: Kevin English is a 67 y.o. male past medical history of mechanical heart valve on Coumadin, CAD, CKD 2, diabetes, hypertension amongst other comorbidities brought in for evaluation of sudden onset of unresponsiveness.  Last known well was sometime before 4 PM when the wife had seen him.  He was taking a nap and she heard it tired, found him on the floor unresponsive.  EMS was called.  They noted pupillary asymmetry and sonorous breathing.  They drove him straight to [facility] as an acute code stroke.  He was seen and evaluated at the bridge.  Right pupil was fixed and dilated at 6 mm.  Left pupil was very sluggishly reactive at 3 mm. Stat head CT performed showed a large subdural hematoma with significant amount of midline shift to the left and hydrocephalus developing. Brought back to the room and emergently intubated.  EMS also reported extensive alcohol history   LKW: Probably 3:30 PM at best IV thrombolysis given?: no, subdural hematoma EVT: No-subdural hematoma Premorbid modified Rankin scale (mRS): 0   ROS: Unable due to unresponsiveness  Past Medical History:  Diagnosis Date   Anxiety    Borderline diabetes    CAD (coronary artery disease)    a. By cath 08/2013: 70% distal cx-prox LPDA; tandem mod LAD lesions, o/w mild dz.   CKD (chronic kidney disease), stage II    his creat. runs 2-2.5; hasnt seen neph yet - sees dr. Tresa [facility] at [facility]   Complete heart block [facility])    a. s/p PPM    Deafness in left ear    Depression    Diabetes mellitus without complication ([place])    Diverticulitis of colon 15 years ago   Hernia    HTN (hypertension)    Hyperlipidemia    Hypogonadism male    Hypothyroidism    Lung nodule seen on imaging  study 04/10/2014   5 mm nodule right lower lobe   Mitral stenosis    a. Dx 07/2013 - through workup of 2D echo, TEE, and cath, felt to be moderate.   Obesity    OSA on CPAP    Persistent atrial fibrillation (HCC)    a. sustained 160-180 on e-CARDIO monitor placed 07/29/2013. Placed on IV amiodarone at end of May 2015 due to continued paroxysms with RVR.   Renal cell carcinoma of right kidney ([place]) 09/05/2013   clear cell renal cell carcinoma of right kidney treated by radical nephrectomy, Fuhrman grade 2-3, with maximum tumor diameter 2.7 cm, all tumor to find confined to the kidney, and all surgical margins negative (T1aN0).     Renal mass    a. Dx 08/2013: concerning for renal cancer.   Rheumatic heart disease    S/P Minimally invasive maze operation for atrial fibrillation 05/15/2014   Left side lesion set using cryothermy with clipping of LA appendage   S/P minimally invasive mitral valve replacement with metallic valve and maze procedure 05/15/2014   41mm Sorin [employer] Optiform mechanical valve placed via right mini thoracotomy approach   Stroke ([place]) 1998   "MRI showed 3 mild strokes"     Family History  Problem Relation Age of Onset   Hypertension Mother    Heart attack Mother    Stroke Mother    Heart disease  Father    Hypertension Other    Heart disease Brother    Diabetes Brother    Healthy Son    Healthy Daughter      Social History:   reports that he has been smoking cigarettes and cigars. He has a 37.50 pack-year smoking history. He has never used smokeless tobacco. He reports that he does not drink alcohol and does not use drugs.  Medications  Current Facility-Administered Medications:    sodium chloride flush (NS) 0.9 % injection 3 mL, 3 mL, Intravenous, Once, Kingsley, Victoria K, DO  Current Outpatient Medications:    acetaminophen (TYLENOL) 325 MG tablet, Take 325 mg by mouth every 6 (six) hours as needed (pain). , Disp: , Rfl:    albuterol (VENTOLIN HFA)  108 (90 Base) MCG/ACT inhaler, Inhale 2 puffs into the lungs every 4 (four) hours as needed for shortness of breath., Disp: , Rfl:    amLODipine (NORVASC) 10 MG tablet, Take 10 mg by mouth daily., Disp: , Rfl:    aspirin EC 81 MG tablet, Take 1 tablet (81 mg total) by mouth daily., Disp: , Rfl:    Blood Glucose Monitoring Suppl (ONE TOUCH ULTRA MINI) w/Device KIT, Use meter to check blood sugars 1-4 times daily as instructed., Disp: 1 each, Rfl: 0   Continuous Blood Gluc Receiver (FREESTYLE LIBRE 2 READER) DEVI, SCAN AS NEEDED FOR CONTINUOUS GLUCOSE MONITORING., Disp: , Rfl:    Continuous Blood Gluc Sensor (FREESTYLE LIBRE 2 SENSOR) MISC, USE AS DIRECTED EVERY 14 DAYS, Disp: , Rfl:    diltiazem (CARDIZEM CD) 120 MG 24 hr capsule, Take 1 capsule (120 mg total) by mouth at bedtime. (Patient not taking: Reported on 04/04/2021), Disp: 90 capsule, Rfl: 3   glucose blood (ONE TOUCH ULTRA TEST) test strip, Use one strip per test. Test blood sugars 1-4 times daily as instructed., Disp: 100 each, Rfl: 12   Lancets (ONETOUCH DELICA PLUS [id number]) MISC, USE 1 LANCET PER TEST. TEST BLOOD SUGARS 1 4 TIMES PER DAY AS INSTRUCTED., Disp: , Rfl:    levothyroxine (SYNTHROID) 75 MCG tablet, TAKE 1 TABLET (75 MCG TOTAL) BY MOUTH DAILY BEFORE BREAKFAST., Disp: 90 tablet, Rfl: 2   loratadine (CLARITIN) 10 MG tablet, Take 1 tablet (10 mg total) by mouth daily., Disp: , Rfl:    MELATONIN GUMMIES PO, Take 1-2 tablets by mouth at bedtime as needed (sleep). (Patient not taking: Reported on 04/04/2021), Disp: , Rfl:    metFORMIN (GLUCOPHAGE) 500 MG tablet, Take 500 mg by mouth daily with breakfast., Disp: , Rfl:    metoprolol tartrate (LOPRESSOR) 25 MG tablet, Take 1 tablet by mouth 2 (two) times daily., Disp: , Rfl:    Omega-3 Fatty Acids (FISH OIL) 1000 MG CAPS, Take 1,000 mg by mouth daily., Disp: , Rfl:    OneTouch Delica Lancets 99991111 MISC, USE TO TEST BLOOD SUGARS 1-4 TIMES PER DAY AS INSTRUCTED, Disp: 100 each, Rfl: 1    sertraline (ZOLOFT) 50 MG tablet, Take 50 mg by mouth daily., Disp: , Rfl:    warfarin (COUMADIN) 5 MG tablet, TAKE 1 TO 1.5 TABLETS DAILY AS DIRECTED BY ANTICOAGULATION CLINIC, Disp: 120 tablet, Rfl: 1   Exam: Current vital signs: BP (!) 214/93 (BP Location: Right Arm)   Pulse 80   Wt 87.6 kg   SpO2 99%   BMI 29.36 kg/m  Vital signs in last 24 hours: Pulse Rate:  [80] 80 (03/21 1839) BP: (214)/(93) 214/93 (03/21 1839) SpO2:  [99 %] 99 % (03/21  1839) Weight:  [87.6 kg] 87.6 kg (03/21 1839) General: Completely obtunded HEENT: Normocephalic atraumatic Lungs: Sonorous breathing Cardiovascular: Regular rate rhythm Neurological exam Obtunded No response to voice To noxious stimulation has extension of bilateral upper extremities. Cranial nerves: Gaze is somewhat disconjugate with right pupil 6 mm nonreactive, left pupil 3 mm sluggishly reactive, does not blink to threat from either side, facial symmetry appears preserved. Motor examination: No response to voice.  To noxious stimulation is extension of bilateral upper extremities.  Withdraws right lower extremity much stronger than the left. Sensation: As above NIHSS - 23 GCS 5 at best   Labs I have reviewed labs in epic and the results pertinent to this consultation are: CBC    Component Value Date/Time   WBC 23.5 (H) 06/10/2022 1840   RBC 5.03 06/06/2022 1840   HGB 16.1 05/27/2022 1840   HGB 16.7 05/23/2022 1840   HGB 17.2 (H) 01/02/2016 0951   HCT 46.6 05/26/2022 1840   HCT 49.0 05/22/2022 1840   HCT 50.2 (H) 01/02/2016 0951   PLT 243 06/05/2022 1840   PLT 234 01/02/2016 0951   MCV 92.6 06/06/2022 1840   MCV 97.9 01/02/2016 0951   MCH 32.0 06/04/2022 1840   MCHC 34.5 05/26/2022 1840   RDW 14.2 06/05/2022 1840   RDW 14.8 (H) 01/02/2016 0951   LYMPHSABS 1.8 06/21/2022 1840   LYMPHSABS 5.9 (H) 01/02/2016 0951   MONOABS 1.3 (H) 05/29/2022 1840   MONOABS 2.0 (H) 01/02/2016 0951   EOSABS 0.1 05/23/2022 1840   EOSABS  0.1 01/02/2016 0951   BASOSABS 0.1 06/17/2022 1840   BASOSABS 0.1 01/02/2016 0951    CMP     Component Value Date/Time   NA 137 06/07/2022 1840   NA 136 08/29/2020 1314   NA 140 01/02/2016 0951   K 5.1 05/27/2022 1840   K 3.9 01/02/2016 0951   CL 106 06/19/2022 1840   CO2 24 09/29/2020 0529   CO2 22 01/02/2016 0951   GLUCOSE 336 (H) 06/17/2022 1840   GLUCOSE 175 (H) 01/02/2016 0951   BUN 32 (H) 05/29/2022 1840   BUN 18 08/29/2020 1314   BUN 22.2 01/02/2016 0951   CREATININE 1.40 (H) 05/23/2022 1840   CREATININE 1.5 (H) 01/02/2016 0951   CALCIUM 9.9 09/29/2020 0529   CALCIUM 10.5 (H) 01/02/2016 1139   CALCIUM 10.5 (H) 01/02/2016 0951   PROT 6.7 11/11/2018 1137   PROT 6.9 01/02/2016 1139   PROT 6.9 01/02/2016 0951   ALBUMIN 4.3 11/11/2018 1137   ALBUMIN 3.4 (L) 01/02/2016 0951   AST 26 11/11/2018 1137   AST 15 01/02/2016 0951   ALT 53 11/11/2018 1137   ALT 21 01/02/2016 0951   ALKPHOS 79 11/11/2018 1137   ALKPHOS 93 01/02/2016 0951   BILITOT 0.4 11/11/2018 1137   BILITOT 0.25 01/02/2016 0951   GFRNONAA 45 (L) 09/29/2020 0529   GFRAA 59 (L) 03/03/2016 0959  PT-INR-23.3/2.1.  Imaging I have reviewed the images obtained:  CT-head-large right sided acute SDH with midline shift and hyrocephalus, falcine subdural hematoma, uncal herniation  Assessment:  Large right hemispheric subdural hematoma along with falcine subdural, with midline shift and hydrocephalus.  Likely secondary to coagulopathy from Coumadin.  Possible head trauma from fall from bed.  Recommendations: Emergent intubation per ER Stat neurosurgical consultation for subdural hematoma evacuation Stat Coumadin reversal in the ER per ER protocol with Kcentra and vitamin K. Thiamine supplementation given alcohol history  Further management per neurosurgery, ER and critical care teams. Inpatient  neurology will be available as needed.  Please call with questions  -- Amie Portland, MD Neurologist [facility] Pager: [phone]   CRITICAL CARE ATTESTATION Performed by: Amie Portland, MD Total critical care time: 31 minutes Critical care time was exclusive of separately billable procedures and treating other patients and/or supervising APPs/Residents/Students Critical care was necessary to treat or prevent imminent or life-threatening deterioration. This patient is critically ill and at significant risk for neurological worsening and/or death and care requires constant monitoring. Critical care was time spent personally by me on the following activities: development of treatment plan with patient and/or surrogate as well as nursing, discussions with consultants, evaluation of patient's response to treatment, examination of patient, obtaining history from patient or surrogate, ordering and performing treatments and interventions, ordering and review of laboratory studies, ordering and review of radiographic studies, pulse oximetry, re-evaluation of patient's condition, participation in multidisciplinary rounds and medical decision making of high complexity in the care of this patient.

## 2022-06-12 ENCOUNTER — Other Ambulatory Visit: Payer: Self-pay

## 2022-06-12 DIAGNOSIS — Z515 Encounter for palliative care: Secondary | ICD-10-CM

## 2022-06-12 DIAGNOSIS — Z7189 Other specified counseling: Secondary | ICD-10-CM | POA: Diagnosis not present

## 2022-06-12 DIAGNOSIS — J9601 Acute respiratory failure with hypoxia: Secondary | ICD-10-CM | POA: Diagnosis not present

## 2022-06-12 LAB — BASIC METABOLIC PANEL
Anion gap: 10 (ref 5–15)
BUN: 27 mg/dL — ABNORMAL HIGH (ref 8–23)
CO2: 22 mmol/L (ref 22–32)
Calcium: 9.9 mg/dL (ref 8.9–10.3)
Chloride: 103 mmol/L (ref 98–111)
Creatinine, Ser: 1.52 mg/dL — ABNORMAL HIGH (ref 0.61–1.24)
GFR, Estimated: 49 mL/min — ABNORMAL LOW (ref >60)
Glucose, Bld: 293 mg/dL — ABNORMAL HIGH (ref 70–99)
Potassium: 4.3 mmol/L (ref 3.5–5.1)
Sodium: 135 mmol/L (ref 135–145)

## 2022-06-12 LAB — PHOSPHORUS: Phosphorus: 3.6 mg/dL (ref 2.5–4.6)

## 2022-06-12 LAB — MAGNESIUM: Magnesium: 2.5 mg/dL — ABNORMAL HIGH (ref 1.7–2.4)

## 2022-06-12 LAB — GLUCOSE, CAPILLARY
Glucose-Capillary: 281 mg/dL — ABNORMAL HIGH (ref 70–99)
Glucose-Capillary: 207 mg/dL — ABNORMAL HIGH (ref 70–99)
Glucose-Capillary: 291 mg/dL — ABNORMAL HIGH (ref 70–99)
Glucose-Capillary: 329 mg/dL — ABNORMAL HIGH (ref 70–99)
Glucose-Capillary: 186 mg/dL — ABNORMAL HIGH (ref 70–99)
Glucose-Capillary: 229 mg/dL — ABNORMAL HIGH (ref 70–99)

## 2022-06-12 LAB — CBC
HCT: 44.6 % (ref 39.0–52.0)
Hemoglobin: 15.5 g/dL (ref 13.0–17.0)
MCH: 31.8 pg (ref 26.0–34.0)
MCHC: 34.8 g/dL (ref 30.0–36.0)
MCV: 91.4 fL (ref 80.0–100.0)
Platelets: 249 10*3/uL (ref 150–400)
RBC: 4.88 MIL/uL (ref 4.22–5.81)
RDW: 14.3 % (ref 11.5–15.5)
WBC: 19.7 10*3/uL — ABNORMAL HIGH (ref 4.0–10.5)
nRBC: 0 % (ref 0.0–0.2)

## 2022-06-12 LAB — PROTIME-INR
INR: 1.1 (ref 0.8–1.2)
Prothrombin Time: 14.1 seconds (ref 11.4–15.2)

## 2022-06-12 LAB — TRIGLYCERIDES: Triglycerides: 294 mg/dL — ABNORMAL HIGH (ref <150)

## 2022-06-12 LAB — MRSA NEXT GEN BY PCR, NASAL: MRSA by PCR Next Gen: NOT DETECTED

## 2022-06-12 MED ORDER — ONDANSETRON 4 MG PO TBDP: 4.0000 mg | ORAL_TABLET | Freq: Four times a day (QID) | ORAL | Status: DC | PRN

## 2022-06-12 MED ORDER — ONDANSETRON HCL 4 MG/2ML IJ SOLN: 4.0000 mg | Freq: Four times a day (QID) | INTRAMUSCULAR | Status: DC | PRN

## 2022-06-12 MED ORDER — ORAL CARE MOUTH RINSE: 15.0000 mL | OROMUCOSAL | Status: DC | PRN

## 2022-06-12 MED ORDER — ORAL CARE MOUTH RINSE
15.0000 mL | OROMUCOSAL | Status: DC
  Administered 2022-06-12 (×8): 15 mL via OROMUCOSAL

## 2022-06-12 MED ORDER — ACETAMINOPHEN 325 MG PO TABS: 650.0000 mg | ORAL_TABLET | Freq: Four times a day (QID) | ORAL | Status: DC | PRN

## 2022-06-12 MED ORDER — INSULIN ASPART 100 UNIT/ML IJ SOLN
0.0000 [IU] | INTRAMUSCULAR | Status: DC
  Administered 2022-06-12: 5 [IU] via SUBCUTANEOUS
  Administered 2022-06-12: 8 [IU] via SUBCUTANEOUS
  Administered 2022-06-12: 3 [IU] via SUBCUTANEOUS

## 2022-06-12 MED ORDER — CHLORHEXIDINE GLUCONATE CLOTH 2 % EX PADS
6.0000 | MEDICATED_PAD | Freq: Every day | CUTANEOUS | Status: DC
  Administered 2022-06-12: 6 via TOPICAL

## 2022-06-12 MED ORDER — SODIUM CHLORIDE 0.9 % IV SOLN: INTRAVENOUS | Status: DC

## 2022-06-12 MED ORDER — ACETAMINOPHEN 650 MG RE SUPP: 650.0000 mg | Freq: Four times a day (QID) | RECTAL | Status: DC | PRN

## 2022-06-12 MED ORDER — POLYVINYL ALCOHOL 1.4 % OP SOLN: 1.0000 [drp] | Freq: Four times a day (QID) | OPHTHALMIC | Status: DC | PRN

## 2022-06-12 MED ORDER — MIDAZOLAM HCL 2 MG/2ML IJ SOLN
2.0000 mg | INTRAMUSCULAR | Status: DC | PRN
  Administered 2022-06-12 (×2): 2 mg via INTRAVENOUS
  Filled 2022-06-12 (×2): qty 2

## 2022-06-12 MED ORDER — GLYCOPYRROLATE 0.2 MG/ML IJ SOLN
0.2000 mg | INTRAMUSCULAR | Status: DC | PRN
  Administered 2022-06-12: 0.2 mg via INTRAVENOUS
  Filled 2022-06-12: qty 1

## 2022-06-12 MED ORDER — BIOTENE DRY MOUTH MT LIQD: 15.0000 mL | OROMUCOSAL | Status: DC | PRN

## 2022-06-12 NOTE — Progress Notes (Signed)
Nutrition Brief Note  Chart reviewed. Pt assessed by RD due to NPO status on vent support. Plan to transition to comfort care when family ready.   No nutrition interventions planned at this time.  Please re-consult as needed.   Kerman Passey MS, RDN, LDN, CNSC Registered Dietitian 3 Clinical Nutrition RD Pager and On-Call Pager Number Located in [place]

## 2022-06-12 NOTE — Procedures (Signed)
Extubation Procedure Note  Patient Details:   Name: Kevin English DOB: 12-Dec-1955 MRN: [id number]   Airway Documentation:    Vent end date: 06/12/22 Vent end time: 1940   Evaluation  O2 sats: transiently fell during during procedure Complications: Complications of unable to tolerate  Patient did not tolerate procedure well. Bilateral Breath Sounds: Clear, Diminished   No  Patient extubated for comfort care.   [name] [name] 06/12/2022, 7:46 PM

## 2022-06-12 NOTE — Progress Notes (Signed)
   06/12/22 2000  Spiritual Encounters  Type of Visit Initial  Care provided to: [facility] partners present during encounter Nurse  Referral source Nurse (RN/NT/LPN)  Reason for visit End-of-life  OnCall Visit Yes  Spiritual Framework  Presenting Themes Impactful experiences and emotions  Community/Connection Faith community  Family Stress Factors Loss  Interventions  Spiritual Care Interventions Made Compassionate presence;Reflective listening;Normalization of emotions;Narrative/life review  Intervention Outcomes  Outcomes Awareness of support   Ch responded to request for emotional and spiritual support. Family was present at bedside. Ch assisted the family through a life review and provided compassionate presence. No follow-up needed at this time.

## 2022-06-12 NOTE — Progress Notes (Signed)
NAME:  Kevin English, MRN:  [id number], DOB:  1955/09/25, LOS: 1 ADMISSION DATE:  06/01/2022, CONSULTATION DATE: 05/22/2022 REFERRING MD: Maylon Peppers - EDP CHIEF COMPLAINT: Subdural hemorrhage  History of Present Illness:  67 year old-year-old man who presented to [facility] ED 3/21 via EMS for AMS post-fall. PMHx significant for HTN, HLD, Afib (s/p Maze, on warfarin), MVR, CAD, CHB (s/p PPM), OSA (on CPAP), CKD stage II and RCC (s/p nephrectomy), T2DM  Patient was in his usual state of health until 3/21 ~1600 when he suffered a fall.  Wife heard a thud and found him down.  Initially he was doing okay and went to lay down.  When she went to check on him a couple hours later he was much less responsive and EMS was called.  Upon ED arrival, he was experiencing extensor posturing and was emergently intubated for airway protection.  CT Head unfortunately demonstrated a large right-sided subdural hematoma with 2.7 cm of right to left midline shift, subfalcine herniation, and worrisome for uncal herniation.  He was evaluated by Neurology and NSGY who determined this injury to be inoperable and to portend a poor prognosis.    PCCM was asked to admit to ICU for further care.  Pertinent Medical History:   Past Medical History:  Diagnosis Date   Anxiety    Borderline diabetes    CAD (coronary artery disease)    a. By cath 08/2013: 70% distal cx-prox LPDA; tandem mod LAD lesions, o/w mild dz.   CKD (chronic kidney disease), stage II    his creat. runs 2-2.5; hasnt seen neph yet - sees dr. Tresa Moore at [facility]   Complete heart block [facility])    a. s/p PPM    Deafness in left ear    Depression    Diabetes mellitus without complication ([place])    Diverticulitis of colon 15 years ago   Hernia    HTN (hypertension)    Hyperlipidemia    Hypogonadism male    Hypothyroidism    Lung nodule seen on imaging study 04/10/2014   5 mm nodule right lower lobe   Mitral stenosis    a. Dx 07/2013 - through workup of 2D echo, TEE,  and cath, felt to be moderate.   Obesity    OSA on CPAP    Persistent atrial fibrillation (HCC)    a. sustained 160-180 on e-CARDIO monitor placed 07/29/2013. Placed on IV amiodarone at end of May 2015 due to continued paroxysms with RVR.   Renal cell carcinoma of right kidney ([place]) 09/05/2013   clear cell renal cell carcinoma of right kidney treated by radical nephrectomy, Fuhrman grade 2-3, with maximum tumor diameter 2.7 cm, all tumor to find confined to the kidney, and all surgical margins negative (T1aN0).     Renal mass    a. Dx 08/2013: concerning for renal cancer.   Rheumatic heart disease    S/P Minimally invasive maze operation for atrial fibrillation 05/15/2014   Left side lesion set using cryothermy with clipping of LA appendage   S/P minimally invasive mitral valve replacement with metallic valve and maze procedure 05/15/2014   58mm Sorin [employer] Optiform mechanical valve placed via right mini thoracotomy approach   Stroke ([place]) 1998   "MRI showed 3 mild strokes"   Significant Hospital Events: Including procedures, antibiotic start and stop dates in addition to other pertinent events   3/21 Presented to [facility] ED via EMS for AMS post-fall (on Coumadin). CT Head with large right-sided subdural hematoma with 2.7 cm  of right to left midline shift, subfalcine herniation, worrisome for uncal herniation. Intubated for airway protection. PCCM consulted for ICU admission. DNR per family. 3/22 No significant improvement in neurologic status; off sedation. Weaning on vent (PSV 8/5), withdrawing to painful stimuli, weak cough/gag. Remains DNR. Met with family/PMT at bedside who maintain patient would not want long-term intubation, trach/PEG or extraordinary measures.  Interim History / Subjective:  Admitted overnight for large SDH in the setting of fall on Coumadin Wife/daughter at bedside this morning We discussed [name]'s wishes extensively, patient states that he was a very active,  independent and stubborn person who loves to serve others and was very active prior to his multiple illnesses and worsening of comorbidities.  She feels his quality of life recently was not what he would have hoped.  He was previously a Warden/ranger and a paramedic for many years, loved to take care of of the lawn and assist others.  Recently he had been much more limited with mobility and had had several recent falls where he would sink to his knees, etc.   Patient's wife and daughter maintain that patient is a DNR CODE STATUS and that he would not want extraordinary measures; this would not be a quality of life that was acceptable to him. Plan for liberalization of visitation so that family is able to visit [name], once this has occurred we will transition to comfort care.  Objective:  Blood pressure 123/70, pulse 68, temperature 98.3 F (36.8 C), temperature source Oral, resp. rate (!) 22, height 5\' 8"  (1.727 m), weight 85.6 kg, SpO2 97 %.    Vent Mode: PRVC FiO2 (%):  [40 %-100 %] 40 % Set Rate:  [18 bmp-22 bmp] 22 bmp Vt Set:  [540 mL] 540 mL PEEP:  [5 cmH20] 5 cmH20 Plateau Pressure:  [16 cmH20-29 cmH20] 29 cmH20   Intake/Output Summary (Last 24 hours) at 06/12/2022 1103 Last data filed at 06/12/2022 0950 Gross per 24 hour  Intake 536.24 ml  Output 2285 ml  Net -1748.76 ml    Filed Weights   05/24/2022 1839 06/12/22 0500  Weight: 87.6 kg 85.6 kg   Physical Examination: General: Acute-on-chronically ill-appearing middle-aged man in NAD. Intubated. HEENT: [place]/AT, anicteric sclera, R pupil irregular (previous eye injury, partial vision), L pupil 50mm and sluggish, moist mucous membranes. ETT in place. Neuro:  Unresponsive.  Does not respond to verbal, tactile or noxious stimuli. Withdraws to pain in all 4 extremities. Not following commands. Occasional twitching BLE, no other spontaneous movement noted. +Cough and +Gag (weak). CV: RRR, no m/g/r. PULM: Breathing even and unlabored on  vent (PSV 8/5). Lung fields CTAB. GI: Soft, nontender, nondistended. Normoactive bowel sounds. Extremities: No LE edema noted. Skin: Warm/dry, no rashes.   Resolved Hospital Problem List:     Assessment & Plan:   Traumatic subdural hematoma Acute hypoxemic respiratory failure Hypertensive crisis Afib (on Coumadin) S/p MVR T2DM CKD stage II with RCC s/p nephrectomy Concern for herniation on CT Head.  Evaluated by Neurology and NSGY in the ED who feel this is inoperable and unfortunately unsurvivable. Warfarin was reversed in the emergency department with Kcentra. - Remains in ICU for supportive care - Continuing current measures until family has had an opportunity to visit (vent, Keppra, Unasyn) - PMT following, appreciate involvement - Once family is ready, plan to transition to comfort care  Best Practice (right click and "Reselect all SmartList Selections" daily)   Diet/type: NPO DVT prophylaxis: not indicated GI prophylaxis: N/A Lines: N/A  Foley:  N/A Code Status:  DNR Last date of multidisciplinary goals of care discussion [3/22 - DNR, confirmed with patient's wife and daughter at bedside, transition to comfort care once family has had an opportunity to visit.]  Critical care time: N/A   Rhae Lerner [facility] 06/12/22 11:03 AM  Please see [email or web address] for pager details.  From 7A-7P if no response, please call [phone] After hours, please call ELink [phone]

## 2022-06-12 NOTE — Progress Notes (Addendum)
eLink Physician-Brief Progress Note Patient Name: OZZIE HINO DOB: October 31, 1955 MRN: [id number]   Date of Service  06/12/2022  HPI/Events of Note  67 year old male with a past medical history of A-fib on Coumadin that presented to the emergency department with altered mental status after a fall. Found to have anisocoria and CT showed a large subdural hematoma with significant midline shift possible herniation.  Neurology recommended neurosurgical consultation patient admitted to critical care for enteral management.  Coumadin was reversed with Kcentra.  Blood pressure controlled initially with nicardipine.  Patient was admitted to critical care with hopes of neurological recovery, but may withdraw care once family is available.    eICU Interventions  Patient is currently on tight blood pressure control with Cleviprex, Keppra is in place for prophylaxis, empiric antibiotics for suspected aspiration.  Sedated comfortably with RASS currently -2 to -3.  Patient is DNR with anticipation that he will transition to comfort measures in the morning.  Medications reviewed, discussed with nursing, no acute intervention is indicated.   0144 - elevated CBG - Add SSI.  0421 -patient maintaining low urine output.  In the setting is relative hyponatremia 135, initiate normal saline at 100 cc/h.  Intervention Category Evaluation Type: New Patient Evaluation  Kingslee Dowse 06/12/2022, 12:46 AM

## 2022-06-12 NOTE — Consult Note (Signed)
Palliative Medicine Inpatient Consult Note  Consulting Provider: Kemper Durie, DO   Reason for consult:   [facility] Consult  Reason for Consult? SDH, goals of care   06/12/2022  HPI:  Per intake H&P --> 67-year-old-year-old male with past medical history as below which is significant for atrial fibrillation on warfarin, renal cancer status post nephrectomy, mitral valve replacement, and diabetes mellitus.  He was in his usual state of health until 3/21 around 4 PM when he suffered a fall.  Wife heard a thud and found him down.  Initially he was doing okay and went to lay down.  When she went to check on him a couple hours later he was much less responsive and EMS was called.  Upon arrival to the emergency department he was experiencing extensor posturing and was emergently intubated for airway protection.  CT of the head unfortunately demonstrated a large right-sided subdural hematoma with 2.7 cm of right to left midline shift, subfalcine herniation, and worrisome for uncal herniation.  He was evaluated by neurology and neurosurgery who determined this injury to be an operable and to portend a poor prognosis.   Palliative care has been asked to get involved to discuss goals of care.  Clinical Assessment/Goals of Care:  *Please note that this is a verbal dictation therefore any spelling or grammatical errors are due to the "[place] One" system interpretation.  I have reviewed medical records including EPIC notes, labs and imaging, received report from bedside RN, assessed the patient.    I met with patient's spouse Kevin English and daughter to further discuss diagnosis prognosis, [place], EOL wishes, disposition and options.   I introduced Palliative Medicine as specialized medical care for people living with serious illness. It focuses on providing relief from the symptoms and stress of a serious illness. The goal is to improve quality of life for  both the patient and the family.  Medical History Review and Understanding:  A brief review of Kevin English's past medical history inclusive of A-fib, renal cell cancer, mitral valve replacement, and diabetes was identified.  Social History:  Patient lives in [place].  He has been married to his wife since the age of 60.  He has 2 children, 5 grandchildren.  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  He is a man of strong faith and practices within the [employer] denomination.  Functional and Nutritional State:  Per patient's spouse he had not been functioning well for quite some time requiring help with BADLs.  Advance Directives:  A detailed discussion was had today regarding advanced directives.  Patient's spouse Kevin English is a Ambulance person.  Code Status:  Concepts specific to code status, artifical feeding and hydration, continued IV antibiotics and rehospitalization was had.  The difference between a aggressive medical intervention path  and a palliative comfort care path for this patient at this time was had.   Kym is an established DO NOT RESUSCITATE DO NOT INTUBATE CODE STATUS.  Discussion:  A brief review of Kevin English's current medical condition in the setting of a fall and massive subdural hematoma was held.  Discussed the option of comfort based care with the patient's wife.  She expresses that there are more family who would like to visit and spend time with Kevin English after which time she will determine when to pursue extubation.  We did discuss that the emphasis would be on comfort and medications would be provided intravenously for maintenance of  symptom needs.  I shared that a timeframe is hard to predict the goal is that family and friends gather and spend as much time with Kevin English celebrating him as possible.  Discussed the importance of continued conversation with family and their  medical providers regarding overall plan of care and treatment  options, ensuring decisions are within the context of the patients values and GOCs.  Decision Maker: Kevin English [phone]  [phone]   SUMMARY OF RECOMMENDATIONS   DNAR/DNI  Plan at this time is for family and friends to visit this morning to afternoon  Patients wife will alert the team when ready for extubation at which time emphasis would be place on comfort   Ongoing PMT support  Code Status/Advance Care Planning: DNAR/DNI   Palliative Prophylaxis:  Aspiration, Bowel Regimen, Delirium Protocol, Frequent Pain Assessment, Oral Care, Palliative Wound Care, and Turn Reposition  Additional Recommendations (Limitations, Scope, Preferences): Continue current care  Psycho-social/Spiritual:  Desire for further Chaplaincy support: Patients pastor will be visiting Additional Recommendations:    Prognosis: Limited hours to days once extubated.   Discharge Planning: Discharge plan will likely be [facility]  Vitals:   06/12/22 0530 06/12/22 0600  BP: 112/61 123/70  Pulse: 65 68  Resp: (!) 22 (!) 22  Temp:    SpO2: 97% 97%    Intake/Output Summary (Last 24 hours) at 06/12/2022 [id number] Last data filed at 06/12/2022 0600 Gross per 24 hour  Intake 536.24 ml  Output 1635 ml  Net -1098.76 ml   Last Weight  Most recent update: 06/12/2022  5:13 AM    Weight  85.6 kg (188 lb 11.4 oz)            Gen:  Elderly Caucasian M in  HEENT: ETT, OGT,  mucous membranes CV: Regular rate and irregular rhythm  PULM:  Mechanical ventilator ABD: soft/nontender  EXT: No edema  Neuro: Somnolent  PPS: 10%   This conversation/these recommendations were discussed with patient primary care team,  [place] Crane, PA.  Billing based on MDM: High ______________________________________________________ [facility] Team Team Cell Phone: [phone] Please utilize secure chat with additional questions, if there is no response within 30 minutes  please call the above phone number  Palliative Medicine Team providers are available by phone from 7am to 7pm daily and can be reached through the team cell phone.  Should this patient require assistance outside of these hours, please call the patient's attending physician.

## 2022-06-13 LAB — HEMOGLOBIN A1C
Hgb A1c MFr Bld: 7.8 % — ABNORMAL HIGH (ref 4.8–5.6)
Mean Plasma Glucose: 177 mg/dL

## 2022-06-22 NOTE — Progress Notes (Addendum)
Patient was extubated to room air at [facility]  on 06-27-22  Time of death - 06-29-2022 at [place] was present at the bedside Kevin Kinsman, RN and Kevin Overlie, RN verified absence of heart sounds,palpable pulses and no spontaneous respiratory effort.  Smith,MD notifed- Kevin English notified of cardiac time of death  Kevin English, Medical Examiner was also notified of time of death   No belongings present in the room on admission to 2H16.  Kevin English ( wife) confirmed that she took Kevin English' belonging home with her before leaving the emergency department on 06/27/2022.

## 2022-06-22 DEATH — deceased

## 2022-07-10 ENCOUNTER — Ambulatory Visit: Payer: PPO

## 2022-07-16 ENCOUNTER — Encounter (HOSPITAL_COMMUNITY): Payer: PPO

## 2022-07-22 NOTE — Discharge Summary (Signed)
DEATH SUMMARY   Patient Details  Name: Kevin English MRN: [id number] DOB: [birth year]  Admission/Discharge Information   Admit Date:  2022/06/29  Date of Death: Date of Death: 07-01-22  Time of Death: Time of Death: 07-19-2022  Length of Stay: 2  Referring Physician: Meriam Sprague, MD   Fall due to subdural hematoma  Diagnoses  Preliminary cause of death: Cerebral herniation secondary to subdural hematoma with brain compression. Secondary Diagnoses (including complications and co-morbidities):  Principal Problem:   Subdural hematoma Active Problems:   Acute respiratory failure with hypoxia   Hypertensive crisis Prediabetes  coronary artery disease Hypertension Hyperlipidemia Depression Atrial fibrillation Renal cell carcinoma CVA  Brief Hospital Course (including significant findings, care, treatment, and services provided and events leading to death)  Kevin English is a 67 y.o. year old male who was brought to the hospital following a fall at home.  Found by EMS to be less responsive with extensor posturing and was emergently intubated in the field.  Initial CT scan showed large right subdural hematoma with 2.7 cm of right to left midline shift with subfalcine herniation and uncal uncal herniation.  Seen by neurology and neurosurgery who established poor prognosis which would not be altered by surgical intervention.  Patient had been in declining health recently and family opted for withdrawal of care and compassionate extubation.  Pertinent Labs and Studies  Significant Diagnostic Studies DG Abd Portable 1V  Result Date: 06/29/22 CLINICAL DATA:  Nasogastric tube placement EXAM: PORTABLE ABDOMEN - 1 VIEW COMPARISON:  None FINDINGS: Nasogastric tube tip overlies the expected distal body of the stomach. Limited evaluation of the abdominal gas pattern due to underpenetration. No gross free intraperitoneal gas. IMPRESSION: 1. Nasogastric tube tip within the distal body of  the stomach. Electronically Signed   By: Helyn Numbers M.D.   On: June 29, 2022 19:49   DG Chest Portable 1 View  Result Date: 06-29-22 CLINICAL DATA:  Post intubation. EXAM: PORTABLE CHEST 1 VIEW COMPARISON:  Chest radiograph dated 09/27/2020. FINDINGS: Endotracheal tube with tip approximately 3 cm above the carina. Enteric tube extends below the diaphragm with tip beyond the inferior margin of the image. Mild eventration of the right hemidiaphragm with right lung base atelectasis. No focal consolidation, pleural effusion, pneumothorax. The cardiac silhouette is within limits. Left pectoral pacemaker device. No acute osseous pathology. IMPRESSION: 1. Endotracheal tube above the carina. 2. Right lung base atelectasis. Electronically Signed   By: Elgie Collard M.D.   On: 06/29/2022 19:44   CT HEAD CODE STROKE WO CONTRAST  Result Date: June 29, 2022 CLINICAL DATA:  Code stroke.  Fall from bed, hit head G EXAM: CT HEAD WITHOUT CONTRAST TECHNIQUE: Contiguous axial images were obtained from the base of the skull through the vertex without intravenous contrast. RADIATION DOSE REDUCTION: This exam was performed according to the departmental dose-optimization program which includes automated exposure control, adjustment of the mA and/or kV according to patient size and/or use of iterative reconstruction technique. COMPARISON:  09/27/2020 FINDINGS: Evaluation is somewhat limited by motion artifact. Brain: Large mixed density subdural hematoma along the right cerebral convexity, measuring up to 3.2 cm, likely acute and hyperacute blood products. Hemorrhage is also noted along the left aspect of the falx, measuring up to 0.9 cm. Significant mass effect on the right cerebral hemisphere with approximately 2.7 cm of right to left midline shift, resulting in subfalcine herniation. Effacement of the right lateral ventricle and third ventricle, with additional mass effect on and likely entrapment of the  left lateral  ventricle, with enlargement of the left occipital horn and temporal horn. There is also effacement of the third ventricle. Suspect narrowing of the basilar cisterns, worrisome for uncal herniation. Possible small amount of subarachnoid hemorrhage along the left frontal lobe (series 6, image 21), although this may be artifactual. No definite acute infarct or mass. Vascular: No hyperdense vessel. Skull: No acute fracture or suspicious osseous lesion. Sinuses/Orbits: No acute finding. Other: Nasopharyngeal airway.  The mastoids are well aerated. IMPRESSION: 1. Large likely hyperacute and acute subdural hematoma along the right cerebral convexity and falx, measuring up to 3.2 cm, with significant mass effect on the right cerebral hemisphere, with approximately 2.7 cm of right to left midline shift, subfalcine herniation, and narrowing of the basilar cisterns, worrisome for uncal herniation. 2. Effacement of the right lateral ventricle and third ventricle, with entrapment of the left lateral ventricle. 3. Possible small amount of subarachnoid hemorrhage along the left frontal lobe, although this may be artifactual. Impression #1 was discussed by telephone on 05/30/2022 at 6:51 pm with provider ARORA. Electronically Signed   By: Wiliam Ke M.D.   On: 05/22/2022 18:57    Microbiology No results found for this or any previous visit (from the past 240 hour(s)).  Lab Basic Metabolic Panel: No results for input(s): "NA", "K", "CL", "CO2", "GLUCOSE", "BUN", "CREATININE", "CALCIUM", "MG", "PHOS" in the last 168 hours. Liver Function Tests: No results for input(s): "AST", "ALT", "ALKPHOS", "BILITOT", "PROT", "ALBUMIN" in the last 168 hours. No results for input(s): "LIPASE", "AMYLASE" in the last 168 hours. No results for input(s): "AMMONIA" in the last 168 hours. CBC: No results for input(s): "WBC", "NEUTROABS", "HGB", "HCT", "MCV", "PLT" in the last 168 hours. Cardiac Enzymes: No results for input(s):  "CKTOTAL", "CKMB", "CKMBINDEX", "TROPONINI" in the last 168 hours. Sepsis Labs: No results for input(s): "PROCALCITON", "WBC", "LATICACIDVEN" in the last 168 hours.  Procedures/Operations  Mechanical ventilation, intubation.   Kasper Mudrick 06/29/2022, 1:53 PM

## 2022-10-09 ENCOUNTER — Ambulatory Visit: Payer: PPO
